# Patient Record
Sex: Female | Born: 1955 | Race: White | Hispanic: No | Marital: Single | Smoking: Former smoker
Health system: Southern US, Community
[De-identification: ages and names within clinical notes are randomized; demographics above are authoritative.]

## PROBLEM LIST (undated history)

## (undated) DIAGNOSIS — T7840XA Allergy, unspecified, initial encounter: Secondary | ICD-10-CM

## (undated) DIAGNOSIS — C3490 Malignant neoplasm of unspecified part of unspecified bronchus or lung: Secondary | ICD-10-CM

## (undated) DIAGNOSIS — F1910 Other psychoactive substance abuse, uncomplicated: Secondary | ICD-10-CM

## (undated) DIAGNOSIS — F329 Major depressive disorder, single episode, unspecified: Secondary | ICD-10-CM

## (undated) DIAGNOSIS — F32A Depression, unspecified: Secondary | ICD-10-CM

## (undated) DIAGNOSIS — F319 Bipolar disorder, unspecified: Secondary | ICD-10-CM

## (undated) DIAGNOSIS — K219 Gastro-esophageal reflux disease without esophagitis: Secondary | ICD-10-CM

## (undated) HISTORY — DX: Depression, unspecified: F32.A

## (undated) HISTORY — PX: ANKLE FRACTURE SURGERY: SHX122

## (undated) HISTORY — DX: Allergy, unspecified, initial encounter: T78.40XA

## (undated) HISTORY — DX: Major depressive disorder, single episode, unspecified: F32.9

---

## 1982-05-15 HISTORY — PX: TUBAL LIGATION: SHX77

## 2000-11-03 ENCOUNTER — Emergency Department (HOSPITAL_COMMUNITY): Admission: EM | Admit: 2000-11-03 | Discharge: 2000-11-03 | Payer: Self-pay | Admitting: *Deleted

## 2000-11-08 ENCOUNTER — Emergency Department (HOSPITAL_COMMUNITY): Admission: EM | Admit: 2000-11-08 | Discharge: 2000-11-09 | Payer: Self-pay | Admitting: Emergency Medicine

## 2002-09-10 ENCOUNTER — Emergency Department (HOSPITAL_COMMUNITY): Admission: EM | Admit: 2002-09-10 | Discharge: 2002-09-10 | Payer: Self-pay | Admitting: Emergency Medicine

## 2002-09-10 ENCOUNTER — Encounter: Payer: Self-pay | Admitting: Emergency Medicine

## 2002-09-16 ENCOUNTER — Ambulatory Visit (HOSPITAL_BASED_OUTPATIENT_CLINIC_OR_DEPARTMENT_OTHER): Admission: RE | Admit: 2002-09-16 | Discharge: 2002-09-16 | Payer: Self-pay | Admitting: Orthopaedic Surgery

## 2002-10-11 ENCOUNTER — Inpatient Hospital Stay (HOSPITAL_COMMUNITY): Admission: EM | Admit: 2002-10-11 | Discharge: 2002-10-20 | Payer: Self-pay | Admitting: Psychiatry

## 2002-10-21 ENCOUNTER — Emergency Department (HOSPITAL_COMMUNITY): Admission: EM | Admit: 2002-10-21 | Discharge: 2002-10-21 | Payer: Self-pay | Admitting: Emergency Medicine

## 2002-10-21 ENCOUNTER — Inpatient Hospital Stay (HOSPITAL_COMMUNITY): Admission: EM | Admit: 2002-10-21 | Discharge: 2002-10-30 | Payer: Self-pay | Admitting: Psychiatry

## 2002-11-04 ENCOUNTER — Other Ambulatory Visit (HOSPITAL_COMMUNITY): Admission: RE | Admit: 2002-11-04 | Discharge: 2002-11-05 | Payer: Self-pay | Admitting: Psychiatry

## 2002-11-05 ENCOUNTER — Inpatient Hospital Stay (HOSPITAL_COMMUNITY): Admission: AD | Admit: 2002-11-05 | Discharge: 2002-11-07 | Payer: Self-pay | Admitting: Psychiatry

## 2003-11-16 ENCOUNTER — Emergency Department (HOSPITAL_COMMUNITY): Admission: EM | Admit: 2003-11-16 | Discharge: 2003-11-16 | Payer: Self-pay | Admitting: Emergency Medicine

## 2004-02-13 ENCOUNTER — Emergency Department (HOSPITAL_COMMUNITY): Admission: EM | Admit: 2004-02-13 | Discharge: 2004-02-13 | Payer: Self-pay | Admitting: Emergency Medicine

## 2004-07-06 ENCOUNTER — Emergency Department (HOSPITAL_COMMUNITY): Admission: EM | Admit: 2004-07-06 | Discharge: 2004-07-06 | Payer: Self-pay | Admitting: *Deleted

## 2004-09-17 ENCOUNTER — Emergency Department (HOSPITAL_COMMUNITY): Admission: EM | Admit: 2004-09-17 | Discharge: 2004-09-17 | Payer: Self-pay | Admitting: Emergency Medicine

## 2006-02-28 ENCOUNTER — Emergency Department (HOSPITAL_COMMUNITY): Admission: EM | Admit: 2006-02-28 | Discharge: 2006-02-28 | Payer: Self-pay | Admitting: Emergency Medicine

## 2006-07-28 ENCOUNTER — Emergency Department (HOSPITAL_COMMUNITY): Admission: EM | Admit: 2006-07-28 | Discharge: 2006-07-28 | Payer: Self-pay | Admitting: Emergency Medicine

## 2007-04-13 ENCOUNTER — Emergency Department (HOSPITAL_COMMUNITY): Admission: EM | Admit: 2007-04-13 | Discharge: 2007-04-13 | Payer: Self-pay | Admitting: Emergency Medicine

## 2007-11-27 ENCOUNTER — Emergency Department (HOSPITAL_COMMUNITY): Admission: EM | Admit: 2007-11-27 | Discharge: 2007-11-28 | Payer: Self-pay | Admitting: Emergency Medicine

## 2008-09-05 ENCOUNTER — Emergency Department (HOSPITAL_COMMUNITY): Admission: EM | Admit: 2008-09-05 | Discharge: 2008-09-05 | Payer: Self-pay | Admitting: Emergency Medicine

## 2009-05-15 HISTORY — PX: LUNG LOBECTOMY: SHX167

## 2009-08-26 ENCOUNTER — Emergency Department (HOSPITAL_COMMUNITY): Admission: EM | Admit: 2009-08-26 | Discharge: 2009-08-26 | Payer: Self-pay | Admitting: Family Medicine

## 2009-10-20 ENCOUNTER — Emergency Department (HOSPITAL_COMMUNITY): Admission: EM | Admit: 2009-10-20 | Discharge: 2009-10-20 | Payer: Self-pay | Admitting: Family Medicine

## 2009-11-13 ENCOUNTER — Emergency Department (HOSPITAL_COMMUNITY): Admission: EM | Admit: 2009-11-13 | Discharge: 2009-11-14 | Payer: Self-pay | Admitting: Emergency Medicine

## 2010-02-09 ENCOUNTER — Ambulatory Visit (HOSPITAL_COMMUNITY): Admission: RE | Admit: 2010-02-09 | Discharge: 2010-02-09 | Payer: Self-pay | Admitting: Rheumatology

## 2010-02-11 ENCOUNTER — Ambulatory Visit: Payer: Self-pay | Admitting: Internal Medicine

## 2010-02-15 LAB — CBC WITH DIFFERENTIAL/PLATELET
EOS%: 0.4 % (ref 0.0–7.0)
Eosinophils Absolute: 0 10*3/uL (ref 0.0–0.5)
HCT: 37 % (ref 34.8–46.6)
LYMPH%: 26.4 % (ref 14.0–49.7)
MCV: 86.1 fL (ref 79.5–101.0)
MONO#: 0.6 10*3/uL (ref 0.1–0.9)
MONO%: 6.8 % (ref 0.0–14.0)
WBC: 8.6 10*3/uL (ref 3.9–10.3)

## 2010-02-15 LAB — COMPREHENSIVE METABOLIC PANEL
AST: 9 U/L (ref 0–37)
CO2: 26 mEq/L (ref 19–32)
Creatinine, Ser: 0.58 mg/dL (ref 0.40–1.20)
Total Bilirubin: 0.2 mg/dL — ABNORMAL LOW (ref 0.3–1.2)
Total Protein: 5.8 g/dL — ABNORMAL LOW (ref 6.0–8.3)

## 2010-02-18 ENCOUNTER — Ambulatory Visit (HOSPITAL_COMMUNITY): Admission: RE | Admit: 2010-02-18 | Discharge: 2010-02-18 | Payer: Self-pay | Admitting: Internal Medicine

## 2010-02-19 ENCOUNTER — Ambulatory Visit (HOSPITAL_COMMUNITY): Admission: RE | Admit: 2010-02-19 | Discharge: 2010-02-19 | Payer: Self-pay | Admitting: Internal Medicine

## 2010-02-24 ENCOUNTER — Ambulatory Visit (HOSPITAL_COMMUNITY): Admission: RE | Admit: 2010-02-24 | Discharge: 2010-02-24 | Payer: Self-pay | Admitting: Internal Medicine

## 2010-03-01 LAB — CBC WITH DIFFERENTIAL/PLATELET
EOS%: 0.4 % (ref 0.0–7.0)
Eosinophils Absolute: 0 10*3/uL (ref 0.0–0.5)
HCT: 39.2 % (ref 34.8–46.6)
HGB: 12.8 g/dL (ref 11.6–15.9)
MCV: 86.3 fL (ref 79.5–101.0)
MONO#: 0.4 10*3/uL (ref 0.1–0.9)
NEUT#: 7.2 10*3/uL — ABNORMAL HIGH (ref 1.5–6.5)
NEUT%: 83.8 % — ABNORMAL HIGH (ref 38.4–76.8)
RBC: 4.54 10*6/uL (ref 3.70–5.45)
RDW: 14.5 % (ref 11.2–14.5)

## 2010-03-01 LAB — COMPREHENSIVE METABOLIC PANEL
BUN: 11 mg/dL (ref 6–23)
Calcium: 9.2 mg/dL (ref 8.4–10.5)
Chloride: 103 mEq/L (ref 96–112)
Creatinine, Ser: 0.66 mg/dL (ref 0.40–1.20)
Potassium: 4.2 mEq/L (ref 3.5–5.3)
Total Protein: 6.1 g/dL (ref 6.0–8.3)

## 2010-03-03 ENCOUNTER — Ambulatory Visit: Payer: Self-pay | Admitting: Thoracic Surgery

## 2010-03-15 ENCOUNTER — Ambulatory Visit: Payer: Self-pay | Admitting: Thoracic Surgery

## 2010-03-15 ENCOUNTER — Ambulatory Visit (HOSPITAL_COMMUNITY): Admission: RE | Admit: 2010-03-15 | Discharge: 2010-03-15 | Payer: Self-pay | Admitting: Thoracic Surgery

## 2010-03-15 DEATH — deceased

## 2010-03-18 ENCOUNTER — Encounter: Payer: Self-pay | Admitting: Thoracic Surgery

## 2010-03-18 ENCOUNTER — Inpatient Hospital Stay (HOSPITAL_COMMUNITY): Admission: RE | Admit: 2010-03-18 | Discharge: 2010-03-28 | Payer: Self-pay | Admitting: Thoracic Surgery

## 2010-03-18 ENCOUNTER — Ambulatory Visit: Payer: Self-pay | Admitting: Critical Care Medicine

## 2010-03-19 ENCOUNTER — Encounter: Payer: Self-pay | Admitting: Thoracic Surgery

## 2010-04-06 ENCOUNTER — Ambulatory Visit: Payer: Self-pay | Admitting: Thoracic Surgery

## 2010-04-06 ENCOUNTER — Encounter: Admission: RE | Admit: 2010-04-06 | Discharge: 2010-04-06 | Payer: Self-pay | Admitting: Thoracic Surgery

## 2010-04-12 ENCOUNTER — Ambulatory Visit: Payer: Self-pay | Admitting: Internal Medicine

## 2010-04-27 ENCOUNTER — Ambulatory Visit: Payer: Self-pay | Admitting: Thoracic Surgery

## 2010-05-05 LAB — COMPREHENSIVE METABOLIC PANEL
ALT: 16 U/L (ref 0–35)
Albumin: 3.3 g/dL — ABNORMAL LOW (ref 3.5–5.2)
BUN: 13 mg/dL (ref 6–23)
CO2: 26 mEq/L (ref 19–32)
Chloride: 106 mEq/L (ref 96–112)
Creatinine, Ser: 0.64 mg/dL (ref 0.40–1.20)
Potassium: 4.1 mEq/L (ref 3.5–5.3)
Total Bilirubin: 0.3 mg/dL (ref 0.3–1.2)

## 2010-05-05 LAB — CBC WITH DIFFERENTIAL/PLATELET
Basophils Absolute: 0 10*3/uL (ref 0.0–0.1)
EOS%: 2.8 % (ref 0.0–7.0)
HCT: 42.8 % (ref 34.8–46.6)
HGB: 13.9 g/dL (ref 11.6–15.9)
MCH: 28.1 pg (ref 25.1–34.0)
MCV: 86.5 fL (ref 79.5–101.0)
MONO%: 7.6 % (ref 0.0–14.0)
NEUT#: 3.7 10*3/uL (ref 1.5–6.5)
RDW: 15 % — ABNORMAL HIGH (ref 11.2–14.5)
WBC: 6 10*3/uL (ref 3.9–10.3)
lymph#: 1.7 10*3/uL (ref 0.9–3.3)

## 2010-05-08 ENCOUNTER — Emergency Department (HOSPITAL_COMMUNITY)
Admission: EM | Admit: 2010-05-08 | Discharge: 2010-05-08 | Payer: Self-pay | Source: Home / Self Care | Admitting: Emergency Medicine

## 2010-05-12 ENCOUNTER — Ambulatory Visit (HOSPITAL_BASED_OUTPATIENT_CLINIC_OR_DEPARTMENT_OTHER): Payer: Self-pay | Admitting: Internal Medicine

## 2010-05-12 ENCOUNTER — Encounter
Admission: RE | Admit: 2010-05-12 | Discharge: 2010-05-12 | Payer: Self-pay | Source: Home / Self Care | Attending: Thoracic Surgery | Admitting: Thoracic Surgery

## 2010-05-12 LAB — CBC WITH DIFFERENTIAL/PLATELET
BASO%: 0.8 % (ref 0.0–2.0)
Basophils Absolute: 0 10*3/uL (ref 0.0–0.1)
EOS%: 2.3 % (ref 0.0–7.0)
Eosinophils Absolute: 0.1 10*3/uL (ref 0.0–0.5)
HCT: 39.7 % (ref 34.8–46.6)
HGB: 13.6 g/dL (ref 11.6–15.9)
LYMPH%: 31 % (ref 14.0–49.7)
MCH: 29 pg (ref 25.1–34.0)
MCHC: 34.3 g/dL (ref 31.5–36.0)
MONO#: 0.2 10*3/uL (ref 0.1–0.9)
NEUT%: 59.8 % (ref 38.4–76.8)
RBC: 4.69 10*6/uL (ref 3.70–5.45)
lymph#: 1 10*3/uL (ref 0.9–3.3)

## 2010-05-12 LAB — COMPREHENSIVE METABOLIC PANEL
ALT: 33 U/L (ref 0–35)
AST: 24 U/L (ref 0–37)
Alkaline Phosphatase: 158 U/L — ABNORMAL HIGH (ref 39–117)
Calcium: 9.3 mg/dL (ref 8.4–10.5)
Creatinine, Ser: 0.56 mg/dL (ref 0.40–1.20)
Total Bilirubin: 0.3 mg/dL (ref 0.3–1.2)
Total Protein: 6.4 g/dL (ref 6.0–8.3)

## 2010-05-20 LAB — COMPREHENSIVE METABOLIC PANEL
ALT: 33 U/L (ref 0–35)
AST: 28 U/L (ref 0–37)
Albumin: 4 g/dL (ref 3.5–5.2)
Alkaline Phosphatase: 159 U/L — ABNORMAL HIGH (ref 39–117)
BUN: 10 mg/dL (ref 6–23)
CO2: 26 mEq/L (ref 19–32)
Calcium: 8.9 mg/dL (ref 8.4–10.5)
Chloride: 106 mEq/L (ref 96–112)
Creatinine, Ser: 0.64 mg/dL (ref 0.40–1.20)
Glucose, Bld: 106 mg/dL — ABNORMAL HIGH (ref 70–99)
Potassium: 3.5 mEq/L (ref 3.5–5.3)
Sodium: 142 mEq/L (ref 135–145)
Total Bilirubin: 0.2 mg/dL — ABNORMAL LOW (ref 0.3–1.2)
Total Protein: 6.5 g/dL (ref 6.0–8.3)

## 2010-05-20 LAB — CBC WITH DIFFERENTIAL/PLATELET
BASO%: 0 % (ref 0.0–2.0)
Basophils Absolute: 0 10*3/uL (ref 0.0–0.1)
EOS%: 0.9 % (ref 0.0–7.0)
Eosinophils Absolute: 0 10*3/uL (ref 0.0–0.5)
HCT: 37.8 % (ref 34.8–46.6)
HGB: 12.7 g/dL (ref 11.6–15.9)
LYMPH%: 52.5 % — ABNORMAL HIGH (ref 14.0–49.7)
MCH: 28.5 pg (ref 25.1–34.0)
MCHC: 33.6 g/dL (ref 31.5–36.0)
MCV: 84.9 fL (ref 79.5–101.0)
MONO#: 0.5 10*3/uL (ref 0.1–0.9)
MONO%: 14.2 % — ABNORMAL HIGH (ref 0.0–14.0)
NEUT#: 1 10*3/uL — ABNORMAL LOW (ref 1.5–6.5)
NEUT%: 32.4 % — ABNORMAL LOW (ref 38.4–76.8)
Platelets: 86 10*3/uL — ABNORMAL LOW (ref 145–400)
RBC: 4.45 10*6/uL (ref 3.70–5.45)
RDW: 14.1 % (ref 11.2–14.5)
WBC: 3.2 10*3/uL — ABNORMAL LOW (ref 3.9–10.3)
lymph#: 1.7 10*3/uL (ref 0.9–3.3)
nRBC: 0 % (ref 0–0)

## 2010-05-26 LAB — CBC WITH DIFFERENTIAL/PLATELET
BASO%: 0.4 % (ref 0.0–2.0)
Basophils Absolute: 0 10*3/uL (ref 0.0–0.1)
EOS%: 0.2 % (ref 0.0–7.0)
Eosinophils Absolute: 0 10*3/uL (ref 0.0–0.5)
HCT: 38.9 % (ref 34.8–46.6)
HGB: 13 g/dL (ref 11.6–15.9)
LYMPH%: 19.7 % (ref 14.0–49.7)
MCH: 29.1 pg (ref 25.1–34.0)
MCHC: 33.4 g/dL (ref 31.5–36.0)
MCV: 87 fL (ref 79.5–101.0)
MONO#: 0.6 10*3/uL (ref 0.1–0.9)
MONO%: 10.1 % (ref 0.0–14.0)
NEUT#: 3.9 10*3/uL (ref 1.5–6.5)
NEUT%: 69.6 % (ref 38.4–76.8)
Platelets: 228 10*3/uL (ref 145–400)
RBC: 4.47 10*6/uL (ref 3.70–5.45)
RDW: 16.4 % — ABNORMAL HIGH (ref 11.2–14.5)
WBC: 5.5 10*3/uL (ref 3.9–10.3)
lymph#: 1.1 10*3/uL (ref 0.9–3.3)
nRBC: 0 % (ref 0–0)

## 2010-05-26 LAB — COMPREHENSIVE METABOLIC PANEL
ALT: 20 U/L (ref 0–35)
AST: 18 U/L (ref 0–37)
Albumin: 3.3 g/dL — ABNORMAL LOW (ref 3.5–5.2)
Alkaline Phosphatase: 138 U/L — ABNORMAL HIGH (ref 39–117)
BUN: 12 mg/dL (ref 6–23)
CO2: 25 mEq/L (ref 19–32)
Calcium: 9.3 mg/dL (ref 8.4–10.5)
Chloride: 106 mEq/L (ref 96–112)
Creatinine, Ser: 0.56 mg/dL (ref 0.40–1.20)
Glucose, Bld: 101 mg/dL — ABNORMAL HIGH (ref 70–99)
Potassium: 4 mEq/L (ref 3.5–5.3)
Sodium: 141 mEq/L (ref 135–145)
Total Bilirubin: 0.2 mg/dL — ABNORMAL LOW (ref 0.3–1.2)
Total Protein: 6.2 g/dL (ref 6.0–8.3)

## 2010-06-05 ENCOUNTER — Encounter: Payer: Self-pay | Admitting: Rheumatology

## 2010-06-05 ENCOUNTER — Encounter: Payer: Self-pay | Admitting: Thoracic Surgery

## 2010-06-09 LAB — COMPREHENSIVE METABOLIC PANEL
ALT: 54 U/L — ABNORMAL HIGH (ref 0–35)
AST: 38 U/L — ABNORMAL HIGH (ref 0–37)
BUN: 7 mg/dL (ref 6–23)
CO2: 23 mEq/L (ref 19–32)
Calcium: 8.6 mg/dL (ref 8.4–10.5)
Total Protein: 5.9 g/dL — ABNORMAL LOW (ref 6.0–8.3)

## 2010-06-09 LAB — CBC WITH DIFFERENTIAL/PLATELET
BASO%: 0 % (ref 0.0–2.0)
EOS%: 3.7 % (ref 0.0–7.0)
Eosinophils Absolute: 0.1 10*3/uL (ref 0.0–0.5)
MCH: 28.8 pg (ref 25.1–34.0)
MCV: 84.5 fL (ref 79.5–101.0)
MONO#: 0.2 10*3/uL (ref 0.1–0.9)
NEUT#: 0.8 10*3/uL — ABNORMAL LOW (ref 1.5–6.5)
RDW: 15 % — ABNORMAL HIGH (ref 11.2–14.5)
WBC: 2.2 10*3/uL — ABNORMAL LOW (ref 3.9–10.3)

## 2010-06-15 ENCOUNTER — Ambulatory Visit (HOSPITAL_BASED_OUTPATIENT_CLINIC_OR_DEPARTMENT_OTHER): Payer: Self-pay | Admitting: Internal Medicine

## 2010-06-15 DIAGNOSIS — Z5111 Encounter for antineoplastic chemotherapy: Secondary | ICD-10-CM

## 2010-06-15 DIAGNOSIS — C3410 Malignant neoplasm of upper lobe, unspecified bronchus or lung: Secondary | ICD-10-CM

## 2010-06-15 LAB — CBC WITH DIFFERENTIAL/PLATELET
BASO%: 0.3 % (ref 0.0–2.0)
Basophils Absolute: 0 10*3/uL (ref 0.0–0.1)
EOS%: 2.7 % (ref 0.0–7.0)
LYMPH%: 46.9 % (ref 14.0–49.7)
MONO#: 0.5 10*3/uL (ref 0.1–0.9)
MONO%: 17.5 % — ABNORMAL HIGH (ref 0.0–14.0)
NEUT#: 1 10*3/uL — ABNORMAL LOW (ref 1.5–6.5)
NEUT%: 32.6 % — ABNORMAL LOW (ref 38.4–76.8)
Platelets: 129 10*3/uL — ABNORMAL LOW (ref 145–400)
RBC: 3.82 10*6/uL (ref 3.70–5.45)
WBC: 2.9 10*3/uL — ABNORMAL LOW (ref 3.9–10.3)
lymph#: 1.4 10*3/uL (ref 0.9–3.3)

## 2010-06-22 ENCOUNTER — Other Ambulatory Visit: Payer: Self-pay | Admitting: Internal Medicine

## 2010-06-22 ENCOUNTER — Encounter (HOSPITAL_BASED_OUTPATIENT_CLINIC_OR_DEPARTMENT_OTHER): Payer: Self-pay | Admitting: Internal Medicine

## 2010-06-22 DIAGNOSIS — C3410 Malignant neoplasm of upper lobe, unspecified bronchus or lung: Secondary | ICD-10-CM

## 2010-06-22 DIAGNOSIS — Z5111 Encounter for antineoplastic chemotherapy: Secondary | ICD-10-CM

## 2010-06-22 LAB — CBC WITH DIFFERENTIAL/PLATELET
Eosinophils Absolute: 0.1 10*3/uL (ref 0.0–0.5)
HCT: 35.4 % (ref 34.8–46.6)
MCHC: 32.2 g/dL (ref 31.5–36.0)
NEUT%: 46.5 % (ref 38.4–76.8)
RBC: 3.86 10*6/uL (ref 3.70–5.45)
RDW: 19 % — ABNORMAL HIGH (ref 11.2–14.5)
WBC: 4.4 10*3/uL (ref 3.9–10.3)
lymph#: 1.6 10*3/uL (ref 0.9–3.3)

## 2010-06-22 LAB — COMPREHENSIVE METABOLIC PANEL
Alkaline Phosphatase: 131 U/L — ABNORMAL HIGH (ref 39–117)
BUN: 12 mg/dL (ref 6–23)
CO2: 24 mEq/L (ref 19–32)
Glucose, Bld: 118 mg/dL — ABNORMAL HIGH (ref 70–99)
Potassium: 3.9 mEq/L (ref 3.5–5.3)
Sodium: 141 mEq/L (ref 135–145)
Total Bilirubin: 0.1 mg/dL — ABNORMAL LOW (ref 0.3–1.2)

## 2010-07-11 ENCOUNTER — Other Ambulatory Visit: Payer: Self-pay | Admitting: Thoracic Surgery

## 2010-07-11 DIAGNOSIS — C3410 Malignant neoplasm of upper lobe, unspecified bronchus or lung: Secondary | ICD-10-CM

## 2010-07-12 ENCOUNTER — Ambulatory Visit: Payer: Self-pay | Admitting: Thoracic Surgery

## 2010-07-13 ENCOUNTER — Encounter (HOSPITAL_BASED_OUTPATIENT_CLINIC_OR_DEPARTMENT_OTHER): Payer: Self-pay | Admitting: Internal Medicine

## 2010-07-13 ENCOUNTER — Other Ambulatory Visit: Payer: Self-pay | Admitting: Internal Medicine

## 2010-07-13 DIAGNOSIS — C3410 Malignant neoplasm of upper lobe, unspecified bronchus or lung: Secondary | ICD-10-CM

## 2010-07-13 DIAGNOSIS — Z5111 Encounter for antineoplastic chemotherapy: Secondary | ICD-10-CM

## 2010-07-13 DIAGNOSIS — C3490 Malignant neoplasm of unspecified part of unspecified bronchus or lung: Secondary | ICD-10-CM

## 2010-07-13 LAB — CBC WITH DIFFERENTIAL/PLATELET
BASO%: 3.4 % — ABNORMAL HIGH (ref 0.0–2.0)
EOS%: 2.5 % (ref 0.0–7.0)
MCH: 31.7 pg (ref 25.1–34.0)
MCV: 92.4 fL (ref 79.5–101.0)
MONO%: 10.1 % (ref 0.0–14.0)
RBC: 3.09 10*6/uL — ABNORMAL LOW (ref 3.70–5.45)
RDW: 22.4 % — ABNORMAL HIGH (ref 11.2–14.5)

## 2010-07-13 LAB — COMPREHENSIVE METABOLIC PANEL
ALT: 16 U/L (ref 0–35)
Albumin: 3.3 g/dL — ABNORMAL LOW (ref 3.5–5.2)
Chloride: 107 mEq/L (ref 96–112)
Potassium: 4.1 mEq/L (ref 3.5–5.3)
Total Bilirubin: 0.2 mg/dL — ABNORMAL LOW (ref 0.3–1.2)
Total Protein: 5.6 g/dL — ABNORMAL LOW (ref 6.0–8.3)

## 2010-07-25 LAB — BASIC METABOLIC PANEL
Calcium: 9.4 mg/dL (ref 8.4–10.5)
Glucose, Bld: 134 mg/dL — ABNORMAL HIGH (ref 70–99)
Sodium: 137 mEq/L (ref 135–145)

## 2010-07-25 LAB — CBC
Hemoglobin: 13.7 g/dL (ref 12.0–15.0)
MCH: 28 pg (ref 26.0–34.0)
MCHC: 32.4 g/dL (ref 30.0–36.0)
RBC: 4.9 MIL/uL (ref 3.87–5.11)

## 2010-07-25 LAB — DIFFERENTIAL
Basophils Absolute: 0 10*3/uL (ref 0.0–0.1)
Basophils Relative: 0 % (ref 0–1)
Eosinophils Absolute: 0.1 10*3/uL (ref 0.0–0.7)
Eosinophils Relative: 1 % (ref 0–5)
Lymphocytes Relative: 18 % (ref 12–46)
Monocytes Absolute: 0.1 10*3/uL (ref 0.1–1.0)
Monocytes Relative: 2 % — ABNORMAL LOW (ref 3–12)
Neutro Abs: 5 10*3/uL (ref 1.7–7.7)
Neutrophils Relative %: 79 % — ABNORMAL HIGH (ref 43–77)

## 2010-07-26 LAB — GLUCOSE, CAPILLARY
Glucose-Capillary: 101 mg/dL — ABNORMAL HIGH (ref 70–99)
Glucose-Capillary: 104 mg/dL — ABNORMAL HIGH (ref 70–99)
Glucose-Capillary: 114 mg/dL — ABNORMAL HIGH (ref 70–99)
Glucose-Capillary: 114 mg/dL — ABNORMAL HIGH (ref 70–99)
Glucose-Capillary: 115 mg/dL — ABNORMAL HIGH (ref 70–99)
Glucose-Capillary: 115 mg/dL — ABNORMAL HIGH (ref 70–99)
Glucose-Capillary: 120 mg/dL — ABNORMAL HIGH (ref 70–99)
Glucose-Capillary: 121 mg/dL — ABNORMAL HIGH (ref 70–99)
Glucose-Capillary: 122 mg/dL — ABNORMAL HIGH (ref 70–99)
Glucose-Capillary: 125 mg/dL — ABNORMAL HIGH (ref 70–99)
Glucose-Capillary: 127 mg/dL — ABNORMAL HIGH (ref 70–99)
Glucose-Capillary: 128 mg/dL — ABNORMAL HIGH (ref 70–99)
Glucose-Capillary: 128 mg/dL — ABNORMAL HIGH (ref 70–99)
Glucose-Capillary: 131 mg/dL — ABNORMAL HIGH (ref 70–99)
Glucose-Capillary: 134 mg/dL — ABNORMAL HIGH (ref 70–99)
Glucose-Capillary: 134 mg/dL — ABNORMAL HIGH (ref 70–99)
Glucose-Capillary: 134 mg/dL — ABNORMAL HIGH (ref 70–99)
Glucose-Capillary: 135 mg/dL — ABNORMAL HIGH (ref 70–99)
Glucose-Capillary: 136 mg/dL — ABNORMAL HIGH (ref 70–99)
Glucose-Capillary: 141 mg/dL — ABNORMAL HIGH (ref 70–99)
Glucose-Capillary: 143 mg/dL — ABNORMAL HIGH (ref 70–99)
Glucose-Capillary: 144 mg/dL — ABNORMAL HIGH (ref 70–99)
Glucose-Capillary: 154 mg/dL — ABNORMAL HIGH (ref 70–99)
Glucose-Capillary: 170 mg/dL — ABNORMAL HIGH (ref 70–99)
Glucose-Capillary: 185 mg/dL — ABNORMAL HIGH (ref 70–99)
Glucose-Capillary: 185 mg/dL — ABNORMAL HIGH (ref 70–99)
Glucose-Capillary: 199 mg/dL — ABNORMAL HIGH (ref 70–99)
Glucose-Capillary: 243 mg/dL — ABNORMAL HIGH (ref 70–99)
Glucose-Capillary: 99 mg/dL (ref 70–99)

## 2010-07-26 LAB — CBC
HCT: 31.3 % — ABNORMAL LOW (ref 36.0–46.0)
HCT: 32.6 % — ABNORMAL LOW (ref 36.0–46.0)
HCT: 35.2 % — ABNORMAL LOW (ref 36.0–46.0)
HCT: 37.2 % (ref 36.0–46.0)
Hemoglobin: 10 g/dL — ABNORMAL LOW (ref 12.0–15.0)
Hemoglobin: 10.2 g/dL — ABNORMAL LOW (ref 12.0–15.0)
Hemoglobin: 10.8 g/dL — ABNORMAL LOW (ref 12.0–15.0)
Hemoglobin: 9.6 g/dL — ABNORMAL LOW (ref 12.0–15.0)
MCH: 27.7 pg (ref 26.0–34.0)
MCH: 27.8 pg (ref 26.0–34.0)
MCH: 28 pg (ref 26.0–34.0)
MCH: 28 pg (ref 26.0–34.0)
MCH: 28.4 pg (ref 26.0–34.0)
MCHC: 30.7 g/dL (ref 30.0–36.0)
MCHC: 31.3 g/dL (ref 30.0–36.0)
MCHC: 32.8 g/dL (ref 30.0–36.0)
MCV: 87.8 fL (ref 78.0–100.0)
MCV: 88.4 fL (ref 78.0–100.0)
MCV: 90 fL (ref 78.0–100.0)
MCV: 90.5 fL (ref 78.0–100.0)
Platelets: 305 10*3/uL (ref 150–400)
Platelets: 378 10*3/uL (ref 150–400)
RBC: 3.46 MIL/uL — ABNORMAL LOW (ref 3.87–5.11)
RBC: 3.6 MIL/uL — ABNORMAL LOW (ref 3.87–5.11)
RBC: 3.64 MIL/uL — ABNORMAL LOW (ref 3.87–5.11)
RDW: 14.6 % (ref 11.5–15.5)
WBC: 20.4 10*3/uL — ABNORMAL HIGH (ref 4.0–10.5)
WBC: 8.3 10*3/uL (ref 4.0–10.5)

## 2010-07-26 LAB — BASIC METABOLIC PANEL
BUN: 15 mg/dL (ref 6–23)
BUN: 22 mg/dL (ref 6–23)
BUN: 7 mg/dL (ref 6–23)
CO2: 29 mEq/L (ref 19–32)
CO2: 30 mEq/L (ref 19–32)
CO2: 31 mEq/L (ref 19–32)
CO2: 31 mEq/L (ref 19–32)
CO2: 32 mEq/L (ref 19–32)
CO2: 33 mEq/L — ABNORMAL HIGH (ref 19–32)
CO2: 36 mEq/L — ABNORMAL HIGH (ref 19–32)
Calcium: 8.8 mg/dL (ref 8.4–10.5)
Calcium: 9.1 mg/dL (ref 8.4–10.5)
Calcium: 9.2 mg/dL (ref 8.4–10.5)
Calcium: 9.4 mg/dL (ref 8.4–10.5)
Chloride: 100 mEq/L (ref 96–112)
Chloride: 103 mEq/L (ref 96–112)
Chloride: 103 mEq/L (ref 96–112)
Chloride: 93 mEq/L — ABNORMAL LOW (ref 96–112)
Creatinine, Ser: 0.53 mg/dL (ref 0.4–1.2)
Creatinine, Ser: 0.64 mg/dL (ref 0.4–1.2)
Creatinine, Ser: 0.66 mg/dL (ref 0.4–1.2)
GFR calc Af Amer: >60 mL/min (ref >60)
GFR calc Af Amer: >60 mL/min (ref >60)
GFR calc non Af Amer: >60 mL/min (ref >60)
Glucose, Bld: 114 mg/dL — ABNORMAL HIGH (ref 70–99)
Glucose, Bld: 118 mg/dL — ABNORMAL HIGH (ref 70–99)
Glucose, Bld: 137 mg/dL — ABNORMAL HIGH (ref 70–99)
Glucose, Bld: 145 mg/dL — ABNORMAL HIGH (ref 70–99)
Glucose, Bld: 206 mg/dL — ABNORMAL HIGH (ref 70–99)
Glucose, Bld: 96 mg/dL (ref 70–99)
Potassium: 3.4 mEq/L — ABNORMAL LOW (ref 3.5–5.1)
Potassium: 3.6 mEq/L (ref 3.5–5.1)
Potassium: 3.6 mEq/L (ref 3.5–5.1)
Potassium: 3.7 mEq/L (ref 3.5–5.1)
Potassium: 3.9 mEq/L (ref 3.5–5.1)
Sodium: 132 mEq/L — ABNORMAL LOW (ref 135–145)
Sodium: 140 mEq/L (ref 135–145)
Sodium: 146 mEq/L — ABNORMAL HIGH (ref 135–145)

## 2010-07-26 LAB — POCT I-STAT 3, ART BLOOD GAS (G3+)
Acid-Base Excess: 6 mmol/L — ABNORMAL HIGH (ref 0.0–2.0)
Acid-Base Excess: 6 mmol/L — ABNORMAL HIGH (ref 0.0–2.0)
Acid-Base Excess: 7 mmol/L — ABNORMAL HIGH (ref 0.0–2.0)
Bicarbonate: 31.2 mEq/L — ABNORMAL HIGH (ref 20.0–24.0)
Bicarbonate: 31.7 mEq/L — ABNORMAL HIGH (ref 20.0–24.0)
Bicarbonate: 34.1 mEq/L — ABNORMAL HIGH (ref 20.0–24.0)
O2 Saturation: 93 %
O2 Saturation: 94 %
Patient temperature: 97.3
Patient temperature: 98.4
Patient temperature: 98.6
TCO2: 31 mmol/L (ref 0–100)
TCO2: 33 mmol/L (ref 0–100)
TCO2: 33 mmol/L (ref 0–100)
TCO2: 34 mmol/L (ref 0–100)
TCO2: 36 mmol/L (ref 0–100)
pCO2 arterial: 49.4 mmHg — ABNORMAL HIGH (ref 35.0–45.0)
pCO2 arterial: 50 mmHg — ABNORMAL HIGH (ref 35.0–45.0)
pCO2 arterial: 51.7 mmHg — ABNORMAL HIGH (ref 35.0–45.0)
pCO2 arterial: 53.1 mmHg — ABNORMAL HIGH (ref 35.0–45.0)
pH, Arterial: 7.376 (ref 7.350–7.400)
pH, Arterial: 7.412 — ABNORMAL HIGH (ref 7.350–7.400)
pH, Arterial: 7.414 — ABNORMAL HIGH (ref 7.350–7.400)
pH, Arterial: 7.424 — ABNORMAL HIGH (ref 7.350–7.400)
pH, Arterial: 7.426 — ABNORMAL HIGH (ref 7.350–7.400)
pO2, Arterial: 57 mmHg — ABNORMAL LOW (ref 80.0–100.0)
pO2, Arterial: 78 mmHg — ABNORMAL LOW (ref 80.0–100.0)

## 2010-07-26 LAB — COMPREHENSIVE METABOLIC PANEL
AST: 15 U/L (ref 0–37)
Alkaline Phosphatase: 116 U/L (ref 39–117)
BUN: 5 mg/dL — ABNORMAL LOW (ref 6–23)
CO2: 31 mEq/L (ref 19–32)
Chloride: 102 mEq/L (ref 96–112)
Creatinine, Ser: 0.43 mg/dL (ref 0.4–1.2)
GFR calc non Af Amer: >60 mL/min (ref >60)
Potassium: 3.6 mEq/L (ref 3.5–5.1)
Total Bilirubin: 0.5 mg/dL (ref 0.3–1.2)

## 2010-07-26 LAB — CULTURE, RESPIRATORY W GRAM STAIN

## 2010-07-26 LAB — CARDIAC PANEL(CRET KIN+CKTOT+MB+TROPI): Troponin I: 0.03 ng/mL (ref 0.00–0.06)

## 2010-07-27 LAB — CBC
HCT: 37.8 % (ref 36.0–46.0)
Hemoglobin: 12 g/dL (ref 12.0–15.0)
MCH: 27.9 pg (ref 26.0–34.0)
MCHC: 31.7 g/dL (ref 30.0–36.0)
RDW: 14.5 % (ref 11.5–15.5)

## 2010-07-27 LAB — TYPE AND SCREEN
Antibody Screen: NEGATIVE
Unit division: 0

## 2010-07-27 LAB — APTT: aPTT: 30 seconds (ref 24–37)

## 2010-07-27 LAB — COMPREHENSIVE METABOLIC PANEL
ALT: 11 U/L (ref 0–35)
CO2: 25 mEq/L (ref 19–32)
Calcium: 8.7 mg/dL (ref 8.4–10.5)
Creatinine, Ser: 0.58 mg/dL (ref 0.4–1.2)
GFR calc non Af Amer: >60 mL/min (ref >60)
Glucose, Bld: 112 mg/dL — ABNORMAL HIGH (ref 70–99)
Sodium: 136 mEq/L (ref 135–145)
Total Protein: 5.6 g/dL — ABNORMAL LOW (ref 6.0–8.3)

## 2010-07-27 LAB — URINALYSIS, ROUTINE W REFLEX MICROSCOPIC
Glucose, UA: NEGATIVE mg/dL
Ketones, ur: NEGATIVE mg/dL
Nitrite: NEGATIVE
Protein, ur: NEGATIVE mg/dL
Urobilinogen, UA: 0.2 mg/dL (ref 0.0–1.0)

## 2010-07-27 LAB — BLOOD GAS, ARTERIAL
FIO2: 0.21 %
O2 Saturation: 97.5 %
Patient temperature: 98.6
TCO2: 27.7 mmol/L (ref 0–100)
pH, Arterial: 7.423 — ABNORMAL HIGH (ref 7.350–7.400)

## 2010-07-27 LAB — PROTIME-INR
INR: 0.97 (ref 0.00–1.49)
Prothrombin Time: 13.1 seconds (ref 11.6–15.2)

## 2010-07-27 LAB — SURGICAL PCR SCREEN: Staphylococcus aureus: NEGATIVE

## 2010-07-28 LAB — POCT I-STAT, CHEM 8
BUN: 10 mg/dL (ref 6–23)
Hemoglobin: 13.3 g/dL (ref 12.0–15.0)
Potassium: 3.9 mEq/L (ref 3.5–5.1)
Sodium: 136 mEq/L (ref 135–145)
TCO2: 30 mmol/L (ref 0–100)

## 2010-07-28 LAB — CBC
HCT: 37.7 % (ref 36.0–46.0)
Hemoglobin: 12.3 g/dL (ref 12.0–15.0)
MCHC: 32.6 g/dL (ref 30.0–36.0)
MCV: 88.1 fL (ref 78.0–100.0)
RDW: 14 % (ref 11.5–15.5)

## 2010-07-28 LAB — BASIC METABOLIC PANEL
BUN: 10 mg/dL (ref 6–23)
Creatinine, Ser: 0.64 mg/dL (ref 0.4–1.2)
GFR calc Af Amer: >60 mL/min (ref >60)
GFR calc non Af Amer: >60 mL/min (ref >60)
Potassium: 3.9 mEq/L (ref 3.5–5.1)

## 2010-07-28 LAB — PROTIME-INR: INR: 1.04 (ref 0.00–1.49)

## 2010-07-28 LAB — GLUCOSE, CAPILLARY: Glucose-Capillary: 132 mg/dL — ABNORMAL HIGH (ref 70–99)

## 2010-07-31 LAB — POCT I-STAT, CHEM 8
Hemoglobin: 13.6 g/dL (ref 12.0–15.0)
Potassium: 3.4 mEq/L — ABNORMAL LOW (ref 3.5–5.1)
Sodium: 139 mEq/L (ref 135–145)
TCO2: 29 mmol/L (ref 0–100)

## 2010-07-31 LAB — SEDIMENTATION RATE: Sed Rate: 66 mm/hr — ABNORMAL HIGH (ref 0–22)

## 2010-08-01 LAB — POCT I-STAT, CHEM 8
Creatinine, Ser: 0.5 mg/dL (ref 0.4–1.2)
Hemoglobin: 14.6 g/dL (ref 12.0–15.0)
Potassium: 3.9 mEq/L (ref 3.5–5.1)
Sodium: 138 mEq/L (ref 135–145)

## 2010-08-01 LAB — SEDIMENTATION RATE: Sed Rate: 53 mm/hr — ABNORMAL HIGH (ref 0–22)

## 2010-08-03 LAB — POCT I-STAT, CHEM 8
Chloride: 105 mEq/L (ref 96–112)
Glucose, Bld: 134 mg/dL — ABNORMAL HIGH (ref 70–99)
HCT: 47 % — ABNORMAL HIGH (ref 36.0–46.0)
Hemoglobin: 16 g/dL — ABNORMAL HIGH (ref 12.0–15.0)
Potassium: 4.2 mEq/L (ref 3.5–5.1)
Sodium: 138 mEq/L (ref 135–145)

## 2010-08-08 ENCOUNTER — Other Ambulatory Visit (HOSPITAL_COMMUNITY): Payer: Self-pay

## 2010-08-18 ENCOUNTER — Other Ambulatory Visit (HOSPITAL_COMMUNITY): Payer: Self-pay

## 2010-08-24 LAB — DIFFERENTIAL
Basophils Absolute: 0 10*3/uL (ref 0.0–0.1)
Basophils Relative: 1 % (ref 0–1)
Eosinophils Absolute: 0.2 10*3/uL (ref 0.0–0.7)
Monocytes Relative: 8 % (ref 3–12)
Neutro Abs: 4.3 10*3/uL (ref 1.7–7.7)
Neutrophils Relative %: 64 % (ref 43–77)

## 2010-08-24 LAB — COMPREHENSIVE METABOLIC PANEL
ALT: 18 U/L (ref 0–35)
Alkaline Phosphatase: 87 U/L (ref 39–117)
BUN: 13 mg/dL (ref 6–23)
CO2: 29 mEq/L (ref 19–32)
Chloride: 102 mEq/L (ref 96–112)
Glucose, Bld: 98 mg/dL (ref 70–99)
Potassium: 3.5 mEq/L (ref 3.5–5.1)
Sodium: 141 mEq/L (ref 135–145)
Total Bilirubin: 0.3 mg/dL (ref 0.3–1.2)

## 2010-08-24 LAB — CBC
HCT: 42.8 % (ref 36.0–46.0)
Hemoglobin: 14.6 g/dL (ref 12.0–15.0)
RBC: 4.58 MIL/uL (ref 3.87–5.11)
WBC: 6.8 10*3/uL (ref 4.0–10.5)

## 2010-08-26 ENCOUNTER — Other Ambulatory Visit (HOSPITAL_COMMUNITY): Payer: Self-pay

## 2010-08-30 ENCOUNTER — Ambulatory Visit (HOSPITAL_COMMUNITY)
Admission: RE | Admit: 2010-08-30 | Discharge: 2010-08-30 | Disposition: A | Discharge status: Home / Self Care | Payer: Self-pay | Source: Ambulatory Visit | Attending: Internal Medicine | Admitting: Internal Medicine

## 2010-08-30 ENCOUNTER — Other Ambulatory Visit: Payer: Self-pay | Admitting: Internal Medicine

## 2010-08-30 ENCOUNTER — Encounter (HOSPITAL_COMMUNITY): Payer: Self-pay

## 2010-08-30 ENCOUNTER — Encounter (HOSPITAL_BASED_OUTPATIENT_CLINIC_OR_DEPARTMENT_OTHER): Payer: Self-pay | Admitting: Internal Medicine

## 2010-08-30 DIAGNOSIS — C3490 Malignant neoplasm of unspecified part of unspecified bronchus or lung: Secondary | ICD-10-CM | POA: Insufficient documentation

## 2010-08-30 DIAGNOSIS — Z5111 Encounter for antineoplastic chemotherapy: Secondary | ICD-10-CM

## 2010-08-30 DIAGNOSIS — E079 Disorder of thyroid, unspecified: Secondary | ICD-10-CM | POA: Insufficient documentation

## 2010-08-30 DIAGNOSIS — K449 Diaphragmatic hernia without obstruction or gangrene: Secondary | ICD-10-CM | POA: Insufficient documentation

## 2010-08-30 DIAGNOSIS — C3410 Malignant neoplasm of upper lobe, unspecified bronchus or lung: Secondary | ICD-10-CM

## 2010-08-30 HISTORY — DX: Malignant neoplasm of unspecified part of unspecified bronchus or lung: C34.90

## 2010-08-30 LAB — CMP (CANCER CENTER ONLY)
ALT(SGPT): 24 U/L (ref 10–47)
Albumin: 2.5 g/dL — ABNORMAL LOW (ref 3.3–5.5)
Alkaline Phosphatase: 125 U/L — ABNORMAL HIGH (ref 26–84)
Potassium: 3.6 mEq/L (ref 3.3–4.7)
Sodium: 144 mEq/L (ref 128–145)
Total Bilirubin: 0.2 mg/dl (ref 0.20–1.60)
Total Protein: 6.6 g/dL (ref 6.4–8.1)

## 2010-08-30 LAB — CBC WITH DIFFERENTIAL/PLATELET
BASO%: 1.7 % (ref 0.0–2.0)
Eosinophils Absolute: 0.1 10*3/uL (ref 0.0–0.5)
LYMPH%: 31.2 % (ref 14.0–49.7)
MCHC: 33.6 g/dL (ref 31.5–36.0)
MCV: 102 fL — ABNORMAL HIGH (ref 79.5–101.0)
MONO#: 0.4 10*3/uL (ref 0.1–0.9)
MONO%: 10.5 % (ref 0.0–14.0)
NEUT#: 2.2 10*3/uL (ref 1.5–6.5)
Platelets: 177 10*3/uL (ref 145–400)
RBC: 3.43 10*6/uL — ABNORMAL LOW (ref 3.70–5.45)
RDW: 17.2 % — ABNORMAL HIGH (ref 11.2–14.5)
WBC: 3.9 10*3/uL (ref 3.9–10.3)

## 2010-08-30 MED ORDER — IOHEXOL 300 MG/ML  SOLN
80.0000 mL | Freq: Once | INTRAMUSCULAR | Status: AC | PRN
  Administered 2010-08-30: 80 mL via INTRAVENOUS

## 2010-08-31 ENCOUNTER — Encounter (HOSPITAL_BASED_OUTPATIENT_CLINIC_OR_DEPARTMENT_OTHER): Payer: Self-pay | Admitting: Internal Medicine

## 2010-08-31 ENCOUNTER — Other Ambulatory Visit: Payer: Self-pay | Admitting: Internal Medicine

## 2010-08-31 DIAGNOSIS — C3490 Malignant neoplasm of unspecified part of unspecified bronchus or lung: Secondary | ICD-10-CM

## 2010-08-31 DIAGNOSIS — C3410 Malignant neoplasm of upper lobe, unspecified bronchus or lung: Secondary | ICD-10-CM

## 2010-09-27 NOTE — Letter (Signed)
May 12, 2010   Lajuana Matte, MD  [address]  [place], [place] [address]   Re:  [name], [name]                DOB:  15-Mar-1956   Dear Arbutus Ped,   I saw the patient back today.  She was going back to work and having  some pain and went to the emergency room and was on Sunday and they left  have her put her off work for the rest of this week.  Her blood pressure  is 135/89, pulse 100, respirations 16, sats were 96%.  Chest x-ray  showed normal postoperative changes.  Incisions were well-healed.  I  think this is just some post thoracotomy pain and there is no evidence  of any infection.  I released her return to work on the 31st.  We will  see her back again in 2 months with a chest x-ray.   Ines Bloomer, M.D.  Electronically Signed   DPB/MEDQ  D:  05/12/2010  T:  05/13/2010  Job:  [id number]

## 2010-09-27 NOTE — Letter (Signed)
April 06, 2010   Si Gaul, MD  [address]  [place], [place]  [address]   Re:  [name], KNIPPENBERG                  DOB:  [birth year]   Dear Arbutus Ped,   I saw the patient today, after we did a right lobectomy with node  dissection.  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  Her nodes were  all negative.  Nearly postoperatively, she went into respiratory  distress and had to be on the respirator for several days but that has  resolved, and she comes in today doing remarkably well.  Her chest x-ray  showed normal postoperative changes.  Blood pressure is 126/73, pulse  100, respirations 18, sats were 97%.  We removed her chest tube sutures.  She wants to go back to work as a short order.  I will release her next  week to do that at least on a part time basis.  I will see her back  again in 3 weeks with a chest x-ray.  I referred him to Dr. Arbutus Ped for  an oncology opinion.   Ines Bloomer, M.D.  Electronically Signed   DPB/MEDQ  D:  04/06/2010  T:  04/07/2010  Job:  [id number]   cc:   Lajuana Matte, MD

## 2010-09-27 NOTE — Letter (Signed)
March 03, 2010   Lajuana Matte, MD  [phone]  [place], [place] [address]   Re:  Claudia, Gonzalez                DOB:  11/11/1955   Dear Arbutus Ped:   I appreciate the opportunity of seeing the patient.  This 55 year old  patient was found to have a right upper lobe mass and underwent a biopsy  which revealed adenocarcinoma.  She has had no hemoptysis, fever,  chills, or excessive sputum.  A lesion was found by her primary care  doctor with her chest x-ray.  A PET scan was positive and the lesion was  about 5 cm in size.  She still continues to smoke one pack a day, has  been instructed to stop.  The lesion is 5.5 x 3.8 cm.  A PET scan of the  right upper lobe showed that this lesion was positive and there was a  question of a right hilar uptake.  She is referred for evaluation.  Pulmonary function tests are not done.  Her brain scan was also  negative.   PAST MEDICAL HISTORY:  Significant for diabetes mellitus type 2,  depression, bipolar disorder, back pain, and rheumatoid arthritis.   ALLERGIES:  She is allergic to penicillin and sulfa.   MEDICATIONS:  1. Naprosyn 220 mg p.r.n.  2. Furosemide 20 mg p.r.n.  3. Metformin 500 mg twice a day.  4. Methadone 140 mg daily.  5. Prednisone 20 mg daily.   FAMILY HISTORY:  Her father died of stomach cancer and her mother had  breast cancer.   SOCIAL HISTORY:  She is single, has one son.  She works as a Financial risk analyst.  She  has a history of drug abuse in the past and no history of alcohol use.   REVIEW OF SYSTEMS:  CARDIAC:  No angina or atrial fibrillation.  PULMONARY:  No hemoptysis or bronchitis.  GI:  No reflux, nausea, or vomiting.  GU:  No kidney disease, dysuria, or frequent urination.  VASCULAR:  No claudication, DVT, or TIAs.  NEUROLOGICAL:  No dizziness, headaches, blackouts, or seizures.  MUSCULOSKELETAL:  See history of present illness.  PSYCHIATRIC:  See history of present illness.  EYE/ENT:  No change in her eyesight  or hearing.  HEMATOLOGICAL:  No problems with bleeding, clotting disorders, or  anemia.   PHYSICAL EXAMINATION:  General:  She is a slightly obese Caucasian  female, in no acute distress.  Head, Eyes, Ears, Nose, and Throat:  Unremarkable.  Neck:  Supple without thyromegaly.  Chest:  Clear to  auscultation and percussion.  Heart:  Regular, sinus rhythm.  Abdomen:  Obese.  There is no hepatosplenomegaly.  Extremities:  Pulses are 2+.  There is no clubbing or edema.  Neurologic:  She is oriented x3.  Sensory and motor intact.   I think we need to find out about her lymph nodes as well as her  pulmonary function tests.  I will schedule for pulmonary function tests  and then we will proceed with a further bronchoscopy with endobronchial  ultrasound.  If that is negative, then we will proceed with a right  upper lobectomy.  I explained this in detail with the patient and she  agrees to the surgery.   Sincerely,   Ines Bloomer, M.D.  Electronically Signed   DPB/MEDQ  D:  03/03/2010  T:  03/04/2010  Job:  [id number]   cc:   Quitman Livings, MD

## 2010-09-30 NOTE — Op Note (Signed)
NAMEAMIAH, FROHLICH                          ACCOUNT NO.:  [id number]   MEDICAL RECORD NO.:  [id number]                   PATIENT TYPE:  AMB   LOCATION:  DSC                                  FACILITY:  [facility]   PHYSICIAN:  Lubertha Basque. Jerl Santos, M.D.             DATE OF BIRTH:  14-Dec-1955   DATE OF PROCEDURE:  09/16/2002  DATE OF DISCHARGE:                                 OPERATIVE REPORT   PREOPERATIVE DIAGNOSIS:  Left ankle fracture.   POSTOPERATIVE DIAGNOSIS:  Left ankle fracture.   PROCEDURE:  Open reduction and internal fixation, left ankle fracture.   ANESTHESIA:  General.   SURGEON:  Lubertha Basque. Jerl Santos, M.D.   ASSISTANT:  Lindwood Qua, P.A.   INDICATION FOR PROCEDURE:  The patient is a 55 year old woman who tripped  recently and sustained a displaced fracture of the left  ankle.  She was  placed in a splint and offered ORIF in hopes of realigning the congruity of  her joint and minimizing chance of further ankle difficulty.  Informed  operative consent was obtained after discussion of the possible  complications of, reaction to anesthesia, and infection, as well as ankle  stiffness.  Neuroma formation was also discussed.   DESCRIPTION OF PROCEDURE:  The patient was taken to the operating suite,  where a general anesthetic was applied without difficulty.  She was  positioned supine and prepped and draped in the normal sterile fashion.  After the administration of preop antibiotics, the left leg was elevated,  exsanguinated, and a tourniquet inflated about the calf.  A lateral incision  was made centered at the fracture site with dissection down to this site.  Soft tissue was removed from the fracture site, followed by an anatomic  reduction of the fracture with a clamp.  A single interfragmentary screw  from the small-fragment set was placed, which was a fully-threaded 3.5 mm  Synthes screw, cortical.  Fluoroscopy was used to confirm adequate reduction  of fracture  and placement of interfragmentary screw.  Further stabilization  was then added with a one-third tubular six-hole side plate.  I used five  bicortical purchase screws and a single distal fully-threaded cancellous  screw to secure this plate to the fibula.  Fluoroscopy was used in two  planes to confirm adequate placement of the hardware and reduction of the  fracture.  The wound was then irrigated, followed by release of tourniquet.  Deep tissues were reapproximated with 0 and 2-0 undyed Vicryl, followed by  skin closure with staples.  Some Marcaine was injected about the incision  site, followed by Adaptic and dry gauze dressing with a posterior splint of  plaster with the ankle in neutral position.  Estimated blood loss and  intraoperative fluids can be obtained from anesthesia records, as can  accurate tourniquet time.   DISPOSITION:  The patient was extubated in the operating room and taken to  the  recovery room in stable condition.  Plans were for her to go home the  same day and follow up in the office in less than a week.  I will contact  her by phone tonight.                                              Lubertha Basque Jerl Santos, M.D.   PGD/MEDQ  D:  09/16/2002  T:  09/17/2002  Job:  [id number]

## 2010-09-30 NOTE — H&P (Signed)
NAMEEZRI, FANGUY                          ACCOUNT NO.:  [id number]   MEDICAL RECORD NO.:  [id number]                   PATIENT TYPE:  IPS   LOCATION:  0500                                 [facility]ILITY[facility]  [facility]   PHYSICIAN:  Jeanice Lim, M.D.              DATE OF BIRTH:  08/17/1955   DATE OF ADMISSION:  10/21/2002  DATE OF DISCHARGE:  10/30/2002                         PSYCHIATRIC ADMISSION ASSESSMENT   55-year-old single white female involuntarily committed on October 21, 2002.   HISTORY OF PRESENT ILLNESS:  The patient presents with a history of  intentional overdose, taking three Ambien tablets.  The patient states that  she wanted to take these pills so she could sleep as long as her boyfriend  was.  She states that she was upset with her boyfriend because he would not  take her to the [facility] after discharge.  She states that her boyfriend  did not think she was ready to go yet.  Papers report psychotic symptoms,  positive auditory hallucinations, and flight of ideas.  The patient denies  any suicidal or homicidal thoughts.  Denies any psychotic symptoms and  states that she did get mad in the emergency department and wanted to go  home.   PAST PSYCHIATRIC HISTORY:  Second hospitalization at behavioral health  center.  She was originally discharged two days prior.  Discharged on October 20, 2002 for manic behavior.  She has a history of bipolar disorder for the  past 20 years, was hospitalized at [facility] in 2003 for psychotic  symptoms.   SOCIAL HISTORY:  She is a 55 year old single white female.  She has a 53-  year-old child.  The patient states that when she was discharged she went  home with her boyfriend who is 4 years of age.  She has a 12th grade  education.   FAMILY HISTORY:  Mother with bipolar disorder, currently on Zoloft.   ALCOHOL AND DRUG HISTORY:  She is a nonsmoker.  The patient states she use  to drink.  Denies any currently.  Smokes  marijuana and has a history of  crack cocaine use.  Primary care [facility] Minion is Dr. Kerin Salen in [place].   MEDICAL PROBLEMS:  Fracture of her left leg in April 2004.  Currently has  some hardware in place.  Also stress incontinence.   MEDICATIONS:  1. Seroquel 300 mg at bedtime.  2. Neurontin 300 mg q.i.d.  3. Detrol-LA one q.a.m.  4. Geodon 80 mg b.i.d.  5. Protonix 40 mg q.a.m.  6. Eskalith 150 mg in the morning 450 mg, at bedtime.  7. Ambien 10 mg q.h.s.   DRUG ALLERGIES:  XANAX.   PHYSICAL EXAMINATION:  Was done at [facility].  The patient has some  difficulty with ambulation.  She uses a wheelchair.  She does have some  swelling noted bilaterally to her lower legs.  Her alcohol level is  less  than 5.  Acetaminophen level less than 10.  Urine drug screen is positive  for benzo.  Urine pregnancy test is negative.  Acetaminophen level less than  10.  Salicylate level less than 4.   MENTAL STATUS EXAM:  She is alert, oriented, middle-aged female, wanting to  talk, fair eye contact.  Speech is clear.  The patient feels anxious and  upset.  The patient is anxious, crying, when the patient was notified that  she would need to stay for observation.  Thought processes are coherent.  There appears to be no evidence of psychosis.  She does not appear to be  responding to internal stimuli.  She is focused on returning home.  Cognitive function is intact.  Memory is impaired.  Judgment and insight are  poor.  Poor impulse control.   DIAGNOSES:   AXIS I:  Bipolar disorder.   AXIS II:  Deferred.   AXIS III:  1. Fracture of left ankle in April 2004.  2. Stress incontinence.   AXIS IV:  Other psychosocial problems, medical problems.   AXIS V:  Current is 25 to 35.  Past year 27.   PLAN:  Involuntarily admit for intentional overdose.  Manic behavior.  Contract for safety.  Check every 15 minutes.  The patient will be placed on  the 400 hall for close monitoring.  Will stabilize her  mood and thinking.  Will resume her medications.  Will check her lithium level.  Medication  compliance was discussed with the patient.  The patient is to increase her  coping skills by attending group.  The patient is to increase her coping  skills by attending group.  She will have family session with her boyfriend  if possible.  The patient is to follow up at mental health and our goal is  for the patient to remain drug free.  Tentative length of stay is three to  five days.      Landry Corporal, N.P.                       Jeanice Lim, M.D.    JO/MEDQ  D:  11/24/2002  T:  11/24/2002  Job:  [id number]

## 2010-09-30 NOTE — H&P (Signed)
NAMEJILLIENNE, [name]                          ACCOUNT NO.:  [id number]   MEDICAL RECORD NO.:  [id number]                   PATIENT TYPE:  IPS   LOCATION:  0507                                 FACILITY:  [facility]   PHYSICIAN:  Jeanice Lim, M.D.              DATE OF BIRTH:  1955/11/26   DATE OF ADMISSION:  11/05/2002  DATE OF DISCHARGE:  11/07/2002                         PSYCHIATRIC ADMISSION ASSESSMENT   DATE OF ASSESSMENT:  November 05, 2002   PATIENT IDENTIFICATION:  This is a 55 year old single white female who is a  voluntary admission.   HISTORY OF PRESENT ILLNESS:  This patient with a history of bipolar disorder  was recently discharged on June 17 and referred by the outpatient clinic for  increased agitated and manic behavior.  The patient endorses fast thoughts  and feeling anxious, not able to be still.  She reports medication  compliance, counting on her sister to give her her medications on a regular  basis.  She denies any suicidal ideation or homicidal ideation, auditory and  visual hallucinations.  Her insight has been poor.  The patient was somewhat  inappropriate this morning in group therapy at the outpatient clinic and was  subsequently referred.  There was some concern that she may be lithium  toxic.   PAST PSYCHIATRIC HISTORY:  This is the patient's third admission to [facility].  She has also been admitted here on June 8  and May 29, also for mania.  The patient has a history of bipolar disorder  for the past 20 years and she has a history of prior suicide attempts by  overdose with Ambien.  The patient has been followed in the [facility].   SUBSTANCE ABUSE HISTORY:  The patient has a history of alcohol abuse,  marijuana and crack use.  She recently reports that she has been using only  marijuana, smokes about one joint a day and does this for the past 25 years.  She also abuses tobacco, smoking about one  pack per day.   PAST MEDICAL HISTORY:  The patient's primary care [name] is not clear.  Medical problems include the patient is status post fracture of her left  lower leg with open reduction internal fixation in April 2004 and she uses  either a wheelchair at times for long distances but is able to ambulate  short distances with a cane.  The patient also reports that she has had  loose stools, usually having to get up in the middle of the night with  diarrhea when her lithium gets up around 900 mg q.h.s.  She is able to  tolerate less a little bit better.  The patient also complains of stress  incontinence.   MEDICATIONS:  1. Eskalith 450 mg two tablets q.h.s.  2. Depakote 250 mg three tablets q.h.s.  3. Protonix 40 mg a.c.  4.  Vioxx 25 mg q.a.m.  5. Zyprexa 15 mg p.o. q.h.s.   DRUG ALLERGIES:  XANAX, VALIUM, and ZOLOFT, and there is some question about  if the patient has a SULFA allergy that has caused her a rash in the past.   PHYSICAL EXAMINATION:  GENERAL:  This is a well nourished, well developed  female who is in no acute distress.  VITAL SIGNS:  On admission to the unit, temperature 97.2, pulse 94,  respirations 18, blood pressure 111/67.  She is 5 feet 9 inches, 165 pounds.  HEENT:  Head is normocephalic, atraumatic.  EENT: PERRLA.  Sclerae are  nonicteric.  No rhinorrhea.  Oropharynx: In satisfactory condition,  noninjected.  NECK:  Supple without thyromegaly.  CARDIOVASCULAR:  S1 and S2, no clicks, murmurs, or gallops.  Regular rate  and rhythm synchronous with radial pulse.  CHEST:  Lungs are clear to auscultation.  Chest is symmetrical with normal  excursion.  ABDOMEN:  Rounded, soft, nontender, nondistended, no masses appreciated.  GENITALIA:  Deferred.  MUSCULOSKELETAL:  The patient is walking with a very slight limp.  She is  able to ambulate well with steady gait with a cane.  Strength is 5/5  throughout.  She is able to attend to her own ADL.  NEUROLOGIC:   Cerebellar function appears intact.  Cranial nerves II-XII are  intact.  EOM are intact, no nystagmus.  No tremor, no signs of ataxia or  akathisia, no overt symptoms of lithium toxicity reported subjectively.  Facial symmetry is present.  Grip strength: Equal bilaterally.  Romberg:  Without findings.  No focal findings.   SOCIAL HISTORY:  This is a 55 year old single white female who likes to  garden.  She lives at home with her boyfriend, has one grown son and one  granddaughter.  She has a basic high school education.  No legal problems.  She is not employed outside the home.   FAMILY HISTORY:  Family history is remarkable for a mother with bipolar  disorder and a son with bipolar disorder.   MENTAL STATUS EXAM:  This is a fully alert patient who is oriented.  She is  appropriately dressed with appropriate affect.  Somewhat pressured and rapid  speech.  Her affect is labile, smiling one minute and then tearful at times  when she discusses some of her stressors and her struggles with her mood.  Speech is rapid and pressured.  Mood is anxious and labile.  Thought process  reveals poor insight.  She is very concrete at this time, distractible and  somewhat tangential.  No overt suicidal or homicidal ideation.  Cognitive:  Intact and oriented x 3.   ADMISSION DIAGNOSES:   AXIS I:  1. Bipolar I disorder, manic.  2. Ethyl alcohol abuse in remission.   AXIS II:  Deferred.   AXIS III:  1. Status post fracture of left lower leg.  2. Stress incontinence.   AXIS IV:  Deferred.   AXIS V:  Current 28, past year 29.   INITIAL PLAN OF CARE:  Plan is to voluntarily admit the patient with q.79m.  checks in place.  We will check a lithium level stat and will hold any  further lithium doses until we have her results back.  Meanwhile, we are  going to check a urinalysis and metabolic panel and other routine labs including a CBC.  We will not recheck her thyroid panel at this time since  we  had just done one previously two weeks ago.  We are going to restart her  previous  medications and add Zyprexa 5 mg q.4h. p.r.n. for agitation, Vistaril 5 mg  q.4h. p.r.n. for anxiety, and Librium 25 mg q.6.h. p.r.n. for anxiety if it  is not relieved by the Vistaril.  The patient is in agreement with plan.   ESTIMATED LENGTH OF STAY:  Between four and six days.     Margaret A. Stephannie Peters                   Jeanice Lim, M.D.    MAS/MEDQ  D:  02/04/2003  T:  02/06/2003  Job:  [id number]

## 2010-09-30 NOTE — Discharge Summary (Signed)
[name], [name]                          ACCOUNT NO.:  [id number]   MEDICAL RECORD NO.:  [id number]                   PATIENT TYPE:  IPS   LOCATION:  0500                                 [facility]ACILITY:  [facility]   PHYSICIAN:  Jeanice Lim, M.D.              DATE OF BIRTH:  1955/09/23   DATE OF ADMISSION:  10/21/2002  DATE OF DISCHARGE:  10/30/2002                                 DISCHARGE SUMMARY   IDENTIFYING INFORMATION:  This is a 55 year old single Caucasian female  involuntarily committed with a history of intentional overdose, positive  flight of ideas. She states she was upset with  her boyfriend, episodically  homicidal prior to admission.   MEDICATIONS:  Seroquel, Neurontin, Detrol, Geodon, Protonix, Eskalith and  Ambien.   ALLERGIES:  Xanax.   PHYSICAL EXAMINATION:  Essentially within normal limits. Neurologically  nonfocal.   LABORATORY DATA:  Routine admission labs alcohol level less than 5. Urine  pregnancy test negative. Urine drug screen positive for benzodiazepines.   MENTAL STATUS EXAM:  An alert and oriented middle-aged female. Speech clear  and mood anxious, upset. Thought process goal directed, thought content  negative for any dangerous, or psychotic symptoms. Cognitively intact.  Judgment and insight poor.   ADMISSION DIAGNOSES:   AXIS I:  Bipolar disorder type 1.   AXIS II:  Deferred.   AXIS III:  Fracture of the left ankle.   AXIS IV:  Minor problems with psychosocial issues and medical problems.   AXIS V:  25/65.   HOSPITAL COURSE:  The patient was admitted and we ordered routine  p.r.n.  medications. She underwent further monitoring for safety. She was placed on  every 15 minute checks. Her lithium level  was followed  up. A hospital  session with her boyfriend was arranged and aftercare planning began.  She  was encouraged to participate in individual, group and milieu therapy.   Initially she had stuttering and pressured speech. Her mood  was labile,  tearful and euphoric at times with rapid swings. Angry at times. She was  feeling out of control. She was denying purposely having overdosed. The  patient required frequent redirection, complaining of smelling from her  vagina. Apparently she placed things in her vagina to get it clean. Her gait  was somewhat stiff, holding hands rigid.   Her mood remained labile. She had poor judgment and insight. She was  gradually discontinued off of Geodon and continued on Zyprexa and optimized  on Cogentin for extrapyramidal symptoms and Depakote was added. Her lithium  level was monitored. The patient had some improvement.   She was quite adamant about discharged as soon as possible. Her mood was  becoming more stable. The patient reported no dangerous ideation or  psychotic symptoms. She  had some improvement in judgment and insight. She  was stabilized on medications. The patient was agreeable  to follow up with  an  intensive outpatient program.   Her condition on discharge was improved. Her mood was more euthymic, affect  brighter, thought process is goal directed, thought content negative for any  dangerous ideation or psychotic symptoms. The patient reported motivation to  be compliant with the aftercare plan. The patient had no extrapyramidal  symptoms at the time of discharge. She was responding  to the Cogentin and  the changes in medications. She reported motivation not to abuse  benzodiazepines and to stay on her medications.   DISCHARGE MEDICATIONS:  1. Zyprexa 15 mg q.h.s.  2. Vioxx 25 mg q. a.m.  3. Lithium 900 mg q. 8 p.m.  4. Cogentin 200 mg q. a.m. and q. 8 p.m.  5. Depakote 250 mg 3 q. 8 p.m.  6. Ambien 10 mg p.o. q. 9 p.m.  7. Protonix 40 mg q. p.m.  8. No Geodon, no Motrin, no Seroquel, no Neurontin, no benzodiazepine, no     cannabis, no alcohol, no cocaine.   FOLLOW UP:  The patient was to have dental follow up and to follow up in an  intensive outpatient  program.   DISCHARGE INSTRUCTIONS:  The importance of medication compliance was  stressed as well as not abusing substances that destabilize her mood and  impair her judgment.   DISCHARGE DIAGNOSES:   AXIS I:  Bipolar disorder type 1.   AXIS II:  Deferred.   AXIS III:  Fracture of the left ankle.   AXIS IV:  Minor problems with psychosocial issues and medical problems.   AXIS VKallie Locks, M.D.    JEM/MEDQ  D:  11/26/2002  T:  11/28/2002  Job:  [id number]

## 2010-09-30 NOTE — Discharge Summary (Signed)
[name], [name]                          ACCOUNT NO.:  [id number]   MEDICAL RECORD NO.:  [id number]                   PATIENT TYPE:  IPS   LOCATION:  0402                                 FACILITY:  [facility]   PHYSICIAN:  Jeanice Lim, M.D.              DATE OF BIRTH:  12/23/1955   DATE OF ADMISSION:  10/11/2002  DATE OF DISCHARGE:  10/20/2002                                 DISCHARGE SUMMARY   IDENTIFYING DATA:  This is a Caucasian female with a history of bipolar  manic disorder.  She was quite agitated at the time of admission with clear  mood lability.  The patient reported doing better once started on lithium  and Geodon.  The patient admitted to abusing cannabis, alcohol, Xanax, crack  cocaine.  She had no cocaine for nine months.   DRUG ALLERGIES:  Possibly to [facility], as per the patient.   MENTAL STATUS EXAM:  Difficult to orient, disheveled.  Speech: Rapid,  pressured.  Mood: Labile.  Thought process: Disorganized.  Goal directed.  Thought content: Perseverating.  Cognitive: Impaired.  Judgment and insight:  Poor.   ADMISSION DIAGNOSES:   AXIS I:  1. Bipolar disorder, manic.  2. Substance-induced mood disorder.   AXIS II:  Deferred.   AXIS III:  Recent fracture to left ankle.   AXIS IV:  Moderate problems with primary support group.   AXIS V:  25/65   HOSPITAL COURSE:  The patient was admitted, ordered routine p.r.n.  medications, received IM Geodon, was started in lithium and Geodon to  stabilize mood and Seroquel to restore sleep.  The patient required one-to-  one due to severe disorganized thinking and need for redirection for safety.  The patient was a fall risk, needing to be careful when walking due to  recent fracture but would not follow directions at times.  The patient  slowly improved with stabilization on medications, showing improvement in  judgment and insight, denying any dangerous ideation, becoming more  appropriate on the unit.   CONDITION ON DISCHARGE:  The patient was discharged in improved condition.  Mood was more stable.  Affect: Brighter.  Thought processes: Goal directed.  Thought content: Negative for dangerous ideation or psychotic symptoms.  There were no extrapyramidal symptoms or side effects from medications  reported at the time of discharge.   DISCHARGE MEDICATIONS:  1. Seroquel 300 mg q.h.s.  2. Neurontin 300 mg q.i.d.  3. Motrin.  4. Detrol.  5. Geodon 80 mg b.i.d.  6. Protonix.  7. Eskalith 450 mg two q.h.s. and 150 mg q.a.m.  8. Ambien 10 mg q.h.s.   FOLLOW UP:  The patient was discharged to follow up with [facility] on June 8 at 9 a.m.   DISCHARGE DIAGNOSES:   AXIS I:  1. Bipolar disorder, manic.  2. Substance-induced mood disorder.   AXIS II:  Deferred.   AXIS III:  Recent fracture to left ankle.   AXIS IV:  Moderate problems with primary support group.   AXIS V:  Global assessment of functioning on discharge was 50-55.                                               Jeanice Lim, M.D.    JEM/MEDQ  D:  11/26/2002  T:  11/28/2002  Job:  [id number]

## 2010-09-30 NOTE — H&P (Signed)
NAMEORAL, HALLGREN                          ACCOUNT NO.:  [id number]   MEDICAL RECORD NO.:  [id number]                   PATIENT TYPE:  IPS   LOCATION:  0402                                 [facility]ACILITY[facility]  [facility]   PHYSICIAN:  Jeanice Lim, M.D.              DATE OF BIRTH:  [birth year]   DATE OF ADMISSION:  10/11/2002  DATE OF DISCHARGE:                         PSYCHIATRIC ADMISSION ASSESSMENT   IDENTIFYING INFORMATION:  Comes from the patient and the records.  This is a  55 year old white single female.   REASON FOR ADMISSION:  She has been depressed and manic for the past week.  She is currently having suicidal ideation.  She has made attempts at suicide  in the past.  She is exhibiting poor impulse control, poor boundaries.  Her  mood is labile.  She reports Jesus is talking to her.  She has had decreased  sleep in the past week, 1-3 hours per day only.  Her speech is rapid,  pressured.  She has flight of ideas and is easily distracted.   PAST PSYCHIATRIC HISTORY:  She states she has been bipolar for 20 years but  she says she only began to get treatment three years ago.  Her first [facility] admission was in 2001 and second in 2002.  This would make her third  psychiatric admission.  The patient states that, the way she came to be  hospitalized originally, was mixing alcohol and Xanax.  This caused her to  pass out and her son took her to the emergency room.  There, emergency room  staff asked would he be willing to send her for help.  He was and, hence,  she was first admitted.  The patient acknowledges noncompliance with  medications but is willing at this time to be restarted.   SOCIAL HISTORY:  She has completed the 12th grade.  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  She has never been married, although she has lived  in adultery x 2.  She does have a 43 year old son.  She feels he is also  bipolar and needs help.   FAMILY HISTORY:  She states her mother is  bipolar and takes Zoloft for her  bipolar illness.   ALCOHOL/DRUG HISTORY:  The patient smokes THC but says she does not abuse  it.  She states she has not used crack in the past nine months.  She only  drinks alcohol when using Xanax to blackout.   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  Medical problems include she does have bladder spasms for  which she takes Detrol LA 4 mg q.d.  She recently stepped off a curb and  fractured her left lateral ankle.  She underwent surgery on or about August 29, 2002.   ALLERGIES:  She states she is allergic to [facility].   POSITIVE PHYSICAL FINDINGS:  She has a well-healed surgical scar on the left  lateral ankle.  She has a probable little lipoma subcutaneously beneath her  left nerve clavicle.  The remainder of her physical examination showed that  her lungs were clear to auscultation and percussion.  Her heart had a  regular rate and rhythm.  Her abdomen was soft with no palpable tenderness,  mass or megaly.  Her musculoskeletal revealed a walking cast that can be  velcroed on the left lower extremity.  She has a well-healed surgical  incision on the left lateral ankle.  She does have some skin changes and  some fluid in her left lower extremities.  She does have some skin changes  and some fluid on the left lower extremity status post her surgical repair  of her break.   REVIEW OF SYSTEMS:  She denies any symptoms of medical illness and states  that she is due to have a pap smear and mammogram.   MENTAL STATUS EXAM:  She is alert.  She can be oriented.  She is neat.  She  is clean.  She has appropriate clothes on.  Her weight is normal.  Her  speech is rapid and pressured.  Her mood is labile.  She does acknowledge  auditory hallucinations with suicidal ideation.  She is scattered,  disorganized, grandiose, persecutory.  Memory and concentration are poor.  Insight and judgment are poor.  Her intelligence  is average.   DIAGNOSES:   AXIS I:  Bipolar disorder, manic, versus substance-induced mania.   AXIS II:  No diagnosis.   AXIS III:  1. Recent fracture of left ankle.  2. Status post surgery.  3. Cough incontinence of her bladder.   AXIS IV:  Moderate.   AXIS V:  She is currently 55 and, in the past year, probably 55.   PLAN:  Reestablish medications to control mania.  She needs substance abuse  treatment for THC abuse/dependence.  She also needs to be evaluated for her  alcohol use and follow-up care in the community needs to be established.     Vic Ripper, P.A.-C.               Jeanice Lim, M.D.    MD/MEDQ  D:  10/12/2002  T:  10/12/2002  Job:  [id number]

## 2010-09-30 NOTE — Discharge Summary (Signed)
[name], [name]                          ACCOUNT NO.:  [id number]   MEDICAL RECORD NO.:  [id number]                   PATIENT TYPE:  IPS   LOCATION:  0507                                 [facility]ILITY[facility]  [facility]   PHYSICIAN:  Jeanice Lim, M.D.              DATE OF BIRTH:  03-Jun-1955   DATE OF ADMISSION:  11/05/2002  DATE OF DISCHARGE:  11/07/2002                                 DISCHARGE SUMMARY   IDENTIFYING DATA:  This is a 55 year old single Caucasian female voluntarily  admitted with a history of bipolar disorder, recently discharged on October 30, 2002, [facility] due to increased agitation and manic-like  behaviors, fast thoughts, and possible side effects from medications with  poor insight.   MEDICATIONS:  1. Eskalith 450 mg two tabs q.h.s.  2. Depakote 250 mg three q.h.s.  3. Protonix.  4. Vioxx.  5. Zyprexa 50 mg q.h.s.   DRUG ALLERGIES:  1. XANAX.  2. VALIUM.  3. ZOLOFT.   PHYSICAL EXAMINATION:  GENERAL:  Physical exam was essentially within normal  limits.  NEUROLOGICAL:  Nonfocal.   LABORATORY DATA:  Routine admission labs were within normal limits.   MENTAL STATUS EXAM:  Alert and oriented, appropriately dressed.  Affect  somewhat expansive with pressured, rapid speech.  Mood was anxious and  affect labile.  Thought process was somewhat scattered, tangential, very  concrete, irritable, distractible.  Cognition intact.  Judgment and insight  poor.   ADMISSION DIAGNOSES:   AXIS I:  1. Bipolar disorder, mixed phase.  2. Alcohol as well as benzodiazepine, cocaine, and cannabis abuse history,     in partial remission.   AXIS II:  Deferred.   AXIS III:  1. Status post fracture of left lower leg.  2. Stress incontinence.   AXIS IV:  Moderate problems with primary support group and substance abuse  problems.   AXIS V:  35/55 to 60.   HOSPITAL COURSE:  Patient was admitted, ordered routine p.r.n. medications,  underwent further  monitoring, was encouraged to participate in the  individual, group, and milieu therapy.  Medications were simplified due to  questionable side effects of the medications.  Patient appeared actually to  be doing better than she had been doing before with increased insight and no  clear side effects of lithium.  Medications were simplified.  The patient  reported a positive response and no side effects.  Family also felt the  patient was doing well.   CONDITION AT DISCHARGE:  She was discharged in improved condition.  Mood was  more euthymic, affect brighter, thought process goal-directed, thought  content negative for dangerous ideation or psychotic symptoms.  Patient was  discharged on medications.   DISCHARGE MEDICATIONS:  1. Cogentin twice per day.  2. Protonix 40 mg q.a.m.  3. Vioxx 25 mg q.a.m.  4. Zyprexa Zydis 15 mg q.h.s.  5. Eskalith CR 450  mg b.i.d.  6. Ambien 10 mg q.h.s.  7. Imodium p.r.n.   FOLLOW UP:  Patient is to follow up with intensive outpatient program  beginning on Monday, November 10, 2002, at 9 a.m.   DISCHARGE DIAGNOSES:   AXIS I:  1. Bipolar disorder, mixed phase.  2. Alcohol as well as benzodiazepine, cocaine, and cannabis abuse history,     in partial remission.   AXIS II:  Deferred.   AXIS III:  1. Status post fracture of left lower leg.  2. Stress incontinence.   AXIS IV:  Moderate problems with primary support group and substance abuse  problems.   AXIS V:  Global assessment of functioning on discharge was 55.                                               Jeanice Lim, M.D.    JEM/MEDQ  D:  12/04/2002  T:  12/05/2002  Job:  [id number]

## 2010-11-25 ENCOUNTER — Other Ambulatory Visit (HOSPITAL_COMMUNITY): Payer: Self-pay

## 2011-01-14 IMAGING — CR DG CHEST 1V PORT
1 series · 1 of 1 positions shown · non-contrast
Comparison: 03/22/2010

CLINICAL DATA: Right lung lesion, status post VATS, ventilatory
support

PORTABLE CHEST - 1 VIEW

[AP]
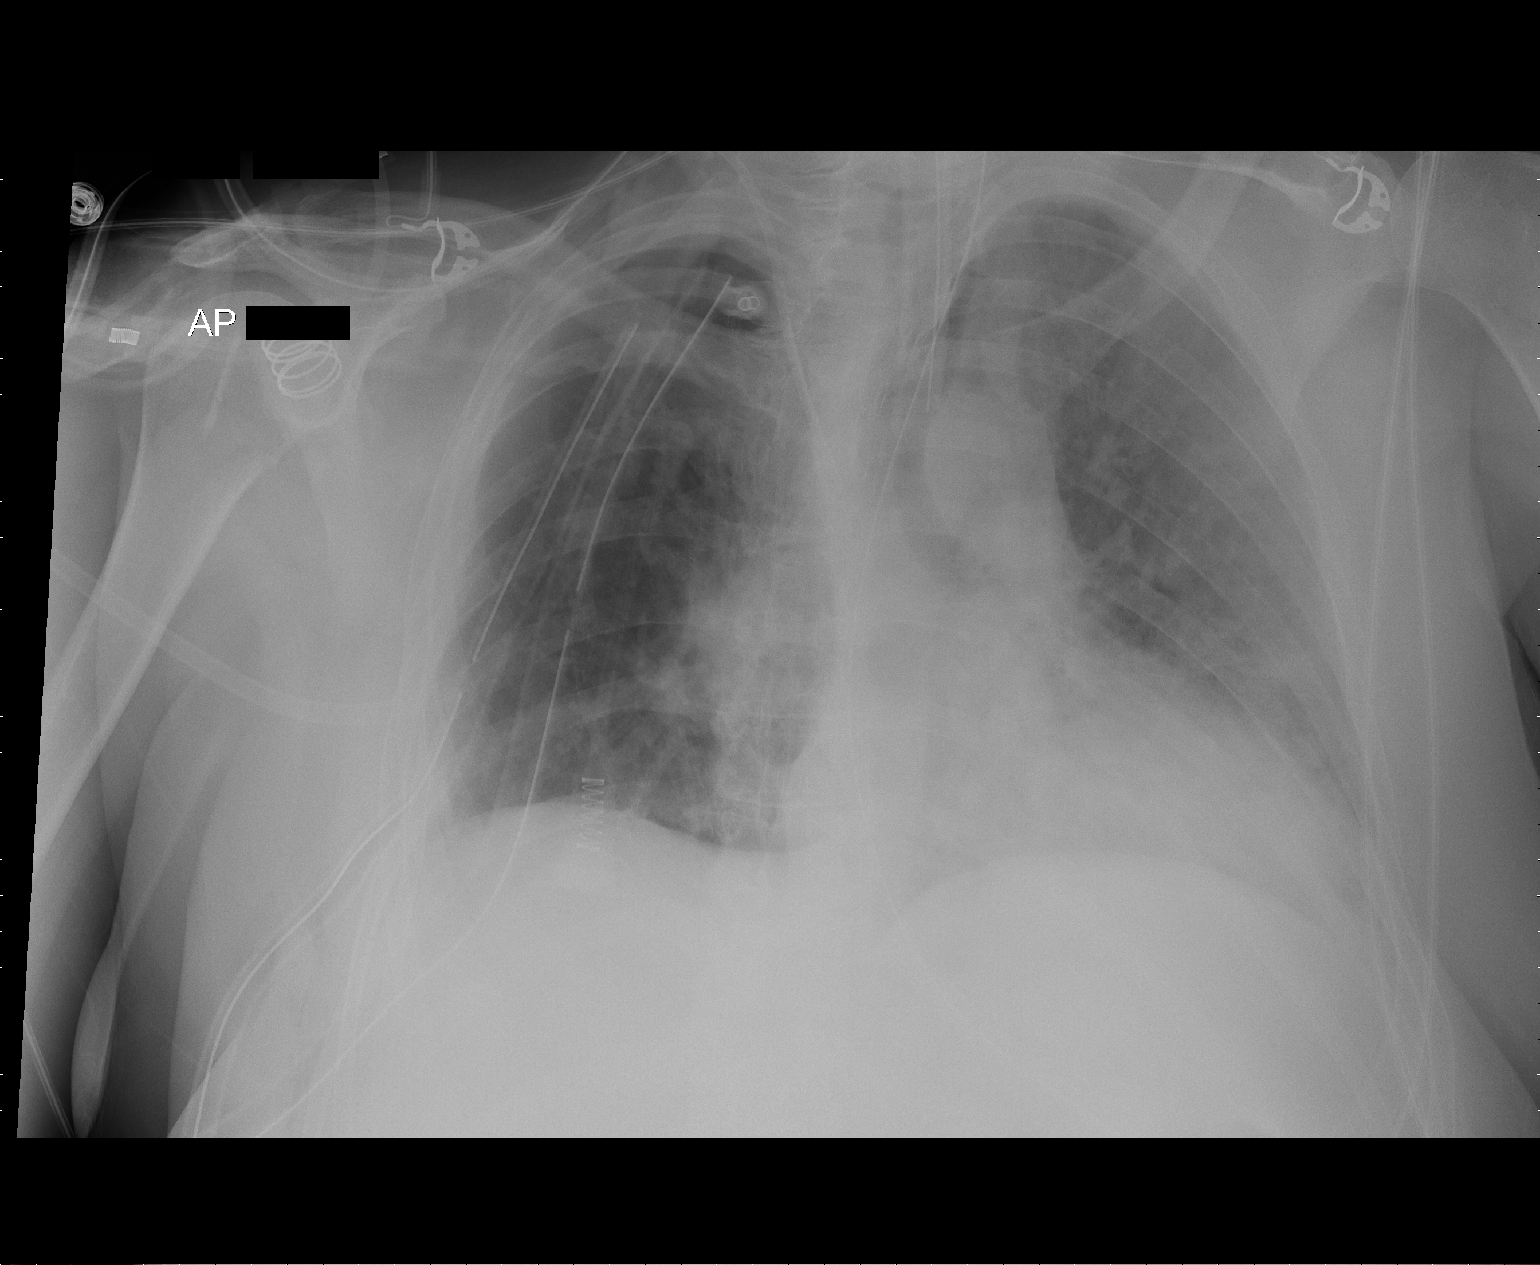

[1 of 1 positions shown; findings below may reference images not displayed]

FINDINGS: Stable support apparatus.  2 right chest tubes remain.
Postop changes in the right hemithorax with volume loss.  No
enlarging effusion or significant pneumothorax by plain
radiography.  Improvement in the diffuse edema pattern throughout
the left lung.
IMPRESSION: Stable postoperative findings.
Improving asymmetric left lung edema pattern.

## 2011-01-17 IMAGING — CR DG CHEST 2V
2 series · 2 of 2 positions shown · non-contrast
Comparison: 03/25/2010

CLINICAL DATA: Right upper lobe lesion, status post VATS

CHEST - 2 VIEW

[w chest pa]
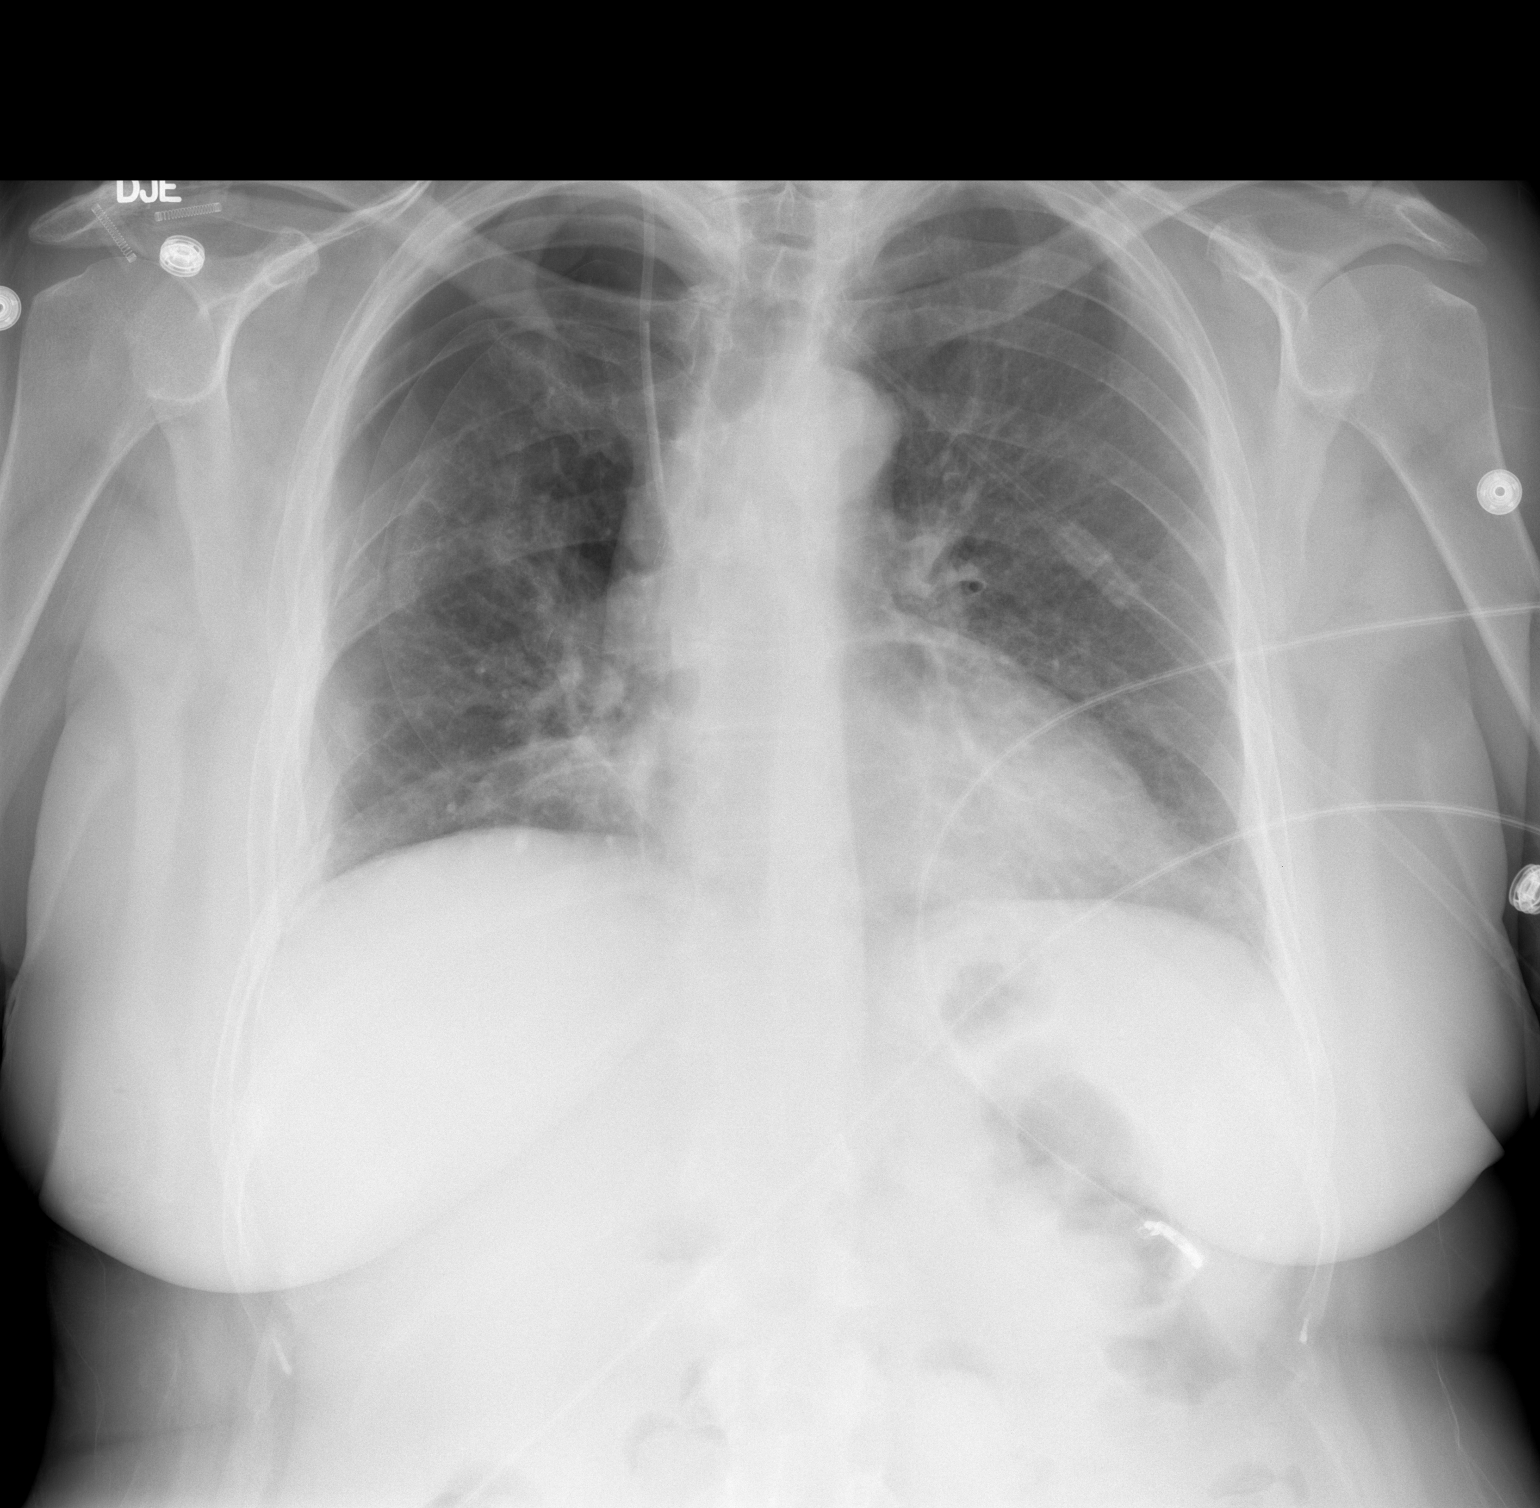

[w chest lat]
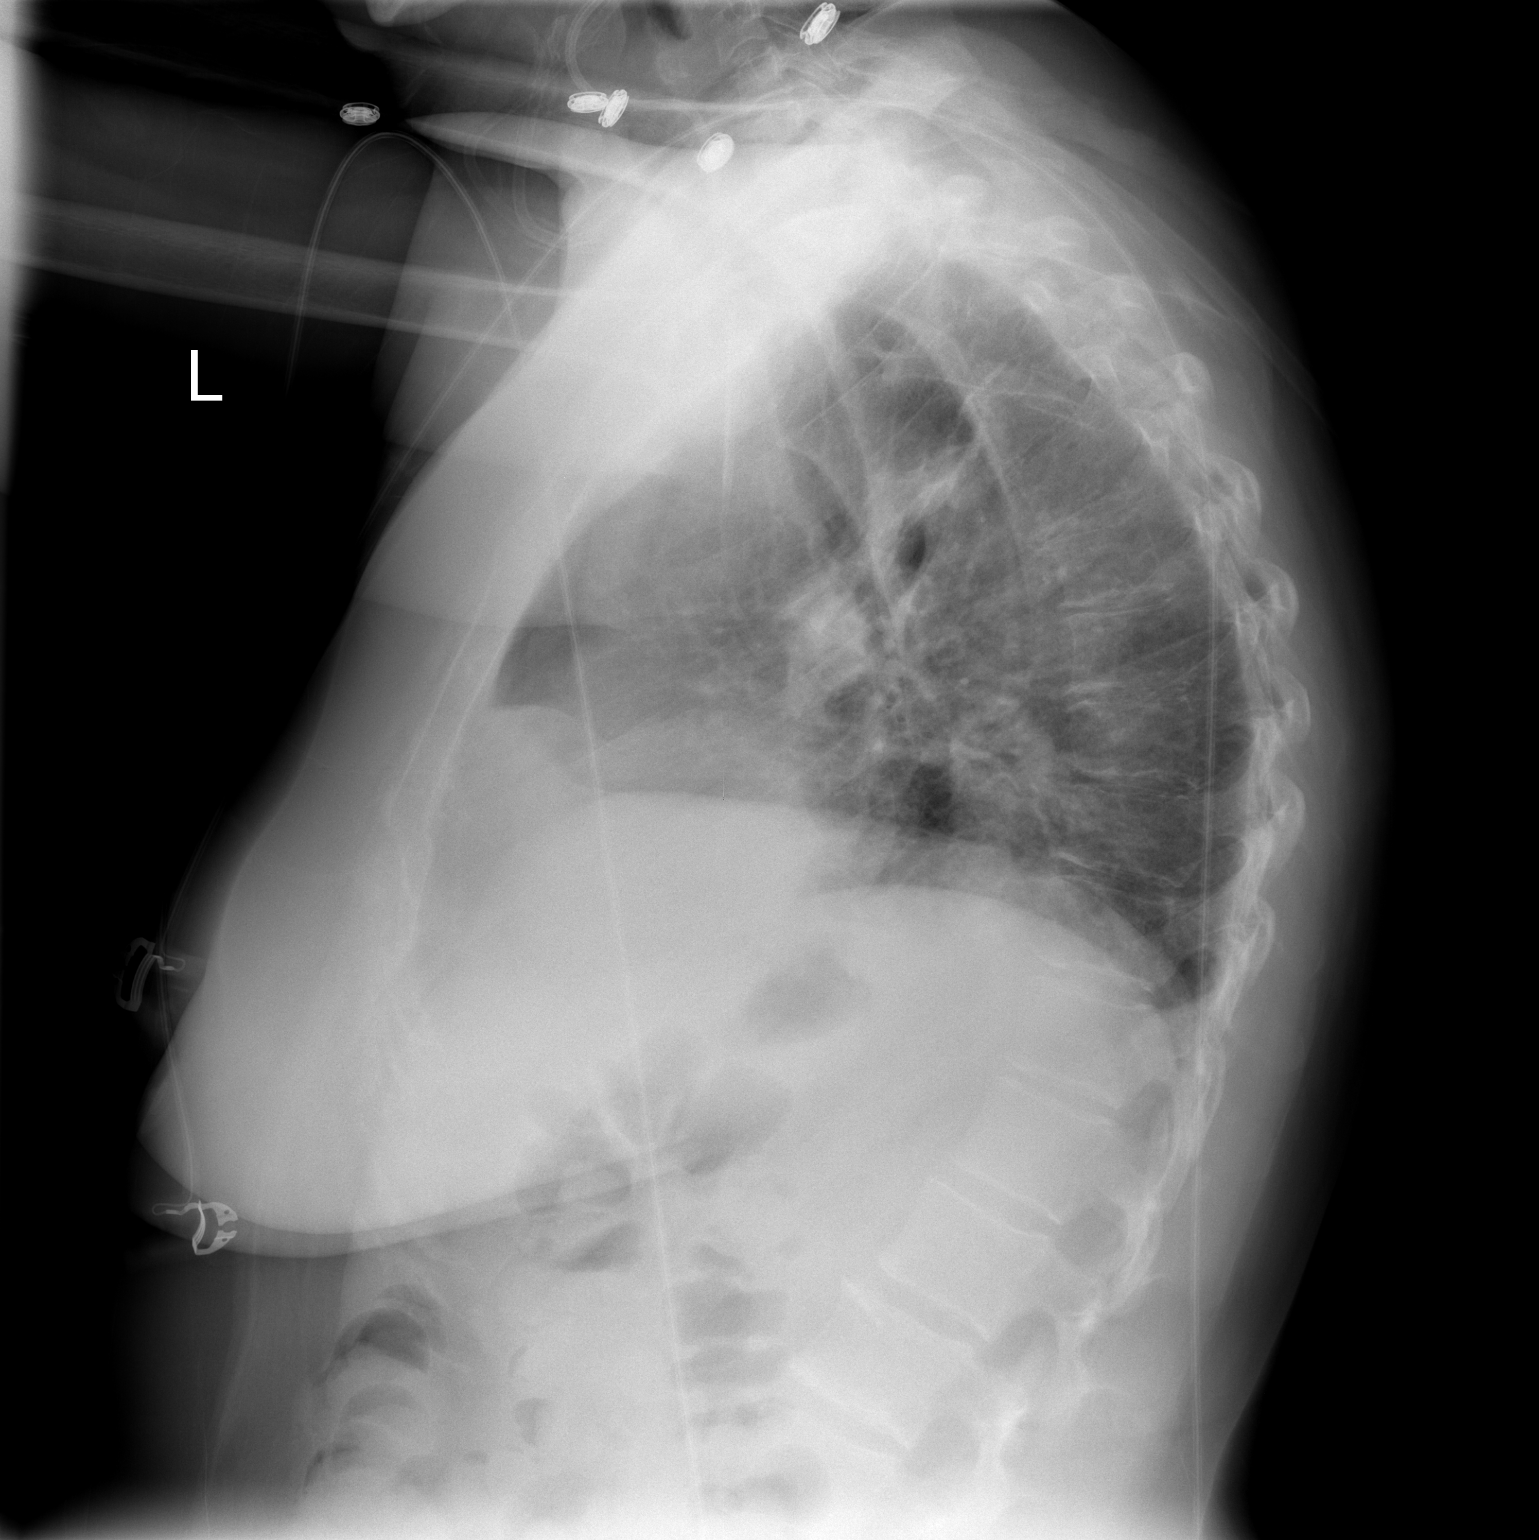

[2 of 2 positions shown; findings below may reference images not displayed]

FINDINGS: The right-sided chest tube has been removed.

There is a 25% right sided pneumothorax, new from the prior exam.

A right internal jugular central venous catheter tip:  SVC.

Wedge resection clips project over the aerated right upper lung.

Heart size is within normal limits.
IMPRESSION: 1.  The right-sided chest tube is no longer present.  There is a
25% right-sided pneumothorax.  No shift of mediastinal or midline
structures to suggest tension pneumothorax.

Critical test results telephoned to Dr. Eceter Sonono, who is
covering on-call for Dr. Zaes, at the time of interpretation on
03/26/2010 at [DATE] a.m.

## 2011-01-24 ENCOUNTER — Other Ambulatory Visit: Payer: Self-pay | Admitting: Internal Medicine

## 2011-01-24 ENCOUNTER — Encounter (HOSPITAL_BASED_OUTPATIENT_CLINIC_OR_DEPARTMENT_OTHER): Payer: Self-pay | Admitting: Internal Medicine

## 2011-01-24 ENCOUNTER — Ambulatory Visit (HOSPITAL_COMMUNITY)
Admission: RE | Admit: 2011-01-24 | Discharge: 2011-01-24 | Disposition: A | Discharge status: Home / Self Care | Payer: Self-pay | Source: Ambulatory Visit | Attending: Internal Medicine | Admitting: Internal Medicine

## 2011-01-24 DIAGNOSIS — C3410 Malignant neoplasm of upper lobe, unspecified bronchus or lung: Secondary | ICD-10-CM

## 2011-01-24 DIAGNOSIS — C3490 Malignant neoplasm of unspecified part of unspecified bronchus or lung: Secondary | ICD-10-CM | POA: Insufficient documentation

## 2011-01-24 DIAGNOSIS — D4959 Neoplasm of unspecified behavior of other genitourinary organ: Secondary | ICD-10-CM | POA: Insufficient documentation

## 2011-01-24 DIAGNOSIS — J984 Other disorders of lung: Secondary | ICD-10-CM | POA: Insufficient documentation

## 2011-01-24 LAB — CBC WITH DIFFERENTIAL/PLATELET
BASO%: 0.3 % (ref 0.0–2.0)
EOS%: 1.5 % (ref 0.0–7.0)
LYMPH%: 28.8 % (ref 14.0–49.7)
MCH: 33.8 pg (ref 25.1–34.0)
MCHC: 34.5 g/dL (ref 31.5–36.0)
MCV: 98.1 fL (ref 79.5–101.0)
MONO%: 4.2 % (ref 0.0–14.0)
Platelets: 206 10*3/uL (ref 145–400)
RBC: 3.98 10*6/uL (ref 3.70–5.45)
RDW: 14 % (ref 11.2–14.5)

## 2011-01-24 LAB — CMP (CANCER CENTER ONLY)
AST: 22 U/L (ref 11–38)
Alkaline Phosphatase: 118 U/L — ABNORMAL HIGH (ref 26–84)
Glucose, Bld: 95 mg/dL (ref 73–118)
Sodium: 144 mEq/L (ref 128–145)
Total Bilirubin: 0.3 mg/dl (ref 0.20–1.60)
Total Protein: 6.9 g/dL (ref 6.4–8.1)

## 2011-01-24 MED ORDER — IOHEXOL 300 MG/ML  SOLN
80.0000 mL | Freq: Once | INTRAMUSCULAR | Status: AC | PRN
  Administered 2011-01-24: 80 mL via INTRAVENOUS

## 2011-01-30 ENCOUNTER — Encounter (HOSPITAL_BASED_OUTPATIENT_CLINIC_OR_DEPARTMENT_OTHER): Payer: Self-pay | Admitting: Internal Medicine

## 2011-01-30 ENCOUNTER — Other Ambulatory Visit: Payer: Self-pay | Admitting: Internal Medicine

## 2011-01-30 DIAGNOSIS — C3490 Malignant neoplasm of unspecified part of unspecified bronchus or lung: Secondary | ICD-10-CM

## 2011-01-30 DIAGNOSIS — N2889 Other specified disorders of kidney and ureter: Secondary | ICD-10-CM

## 2011-01-30 DIAGNOSIS — N289 Disorder of kidney and ureter, unspecified: Secondary | ICD-10-CM

## 2011-01-30 DIAGNOSIS — Z85118 Personal history of other malignant neoplasm of bronchus and lung: Secondary | ICD-10-CM

## 2011-02-13 ENCOUNTER — Other Ambulatory Visit (HOSPITAL_COMMUNITY): Payer: Self-pay | Admitting: Urology

## 2011-02-13 DIAGNOSIS — N2889 Other specified disorders of kidney and ureter: Secondary | ICD-10-CM

## 2011-02-17 ENCOUNTER — Ambulatory Visit (HOSPITAL_COMMUNITY): Payer: Self-pay

## 2011-02-17 ENCOUNTER — Other Ambulatory Visit: Payer: Self-pay | Admitting: Urology

## 2011-02-17 ENCOUNTER — Ambulatory Visit (HOSPITAL_COMMUNITY)
Admission: RE | Admit: 2011-02-17 | Discharge: 2011-02-17 | Disposition: A | Discharge status: Home / Self Care | Payer: Self-pay | Source: Ambulatory Visit | Attending: Urology | Admitting: Urology

## 2011-02-17 DIAGNOSIS — N289 Disorder of kidney and ureter, unspecified: Secondary | ICD-10-CM | POA: Insufficient documentation

## 2011-02-17 DIAGNOSIS — N2889 Other specified disorders of kidney and ureter: Secondary | ICD-10-CM

## 2011-02-17 LAB — CBC
MCH: 33.9 pg (ref 26.0–34.0)
MCHC: 35 g/dL (ref 30.0–36.0)
MCV: 97.1 fL (ref 78.0–100.0)
Platelets: 229 10*3/uL (ref 150–400)
RBC: 4.42 MIL/uL (ref 3.87–5.11)

## 2011-02-17 LAB — APTT: aPTT: 29 s (ref 24–37)

## 2011-02-23 ENCOUNTER — Ambulatory Visit (HOSPITAL_COMMUNITY)
Admission: RE | Admit: 2011-02-23 | Discharge: 2011-02-23 | Disposition: A | Discharge status: Home / Self Care | Payer: Self-pay | Source: Ambulatory Visit | Attending: Urology | Admitting: Urology

## 2011-02-23 ENCOUNTER — Encounter (HOSPITAL_COMMUNITY): Payer: Self-pay

## 2011-02-23 ENCOUNTER — Other Ambulatory Visit: Payer: Self-pay | Admitting: Urology

## 2011-02-23 ENCOUNTER — Ambulatory Visit (HOSPITAL_COMMUNITY): Payer: Self-pay

## 2011-02-23 DIAGNOSIS — D175 Benign lipomatous neoplasm of intra-abdominal organs: Secondary | ICD-10-CM | POA: Insufficient documentation

## 2011-02-23 DIAGNOSIS — Z85118 Personal history of other malignant neoplasm of bronchus and lung: Secondary | ICD-10-CM | POA: Insufficient documentation

## 2011-02-23 LAB — CBC
HCT: 44.2 % (ref 36.0–46.0)
MCV: 98 fL (ref 78.0–100.0)
RBC: 4.51 MIL/uL (ref 3.87–5.11)
RDW: 12.2 % (ref 11.5–15.5)
WBC: 8.1 10*3/uL (ref 4.0–10.5)

## 2011-02-23 LAB — APTT: aPTT: 28 seconds (ref 24–37)

## 2011-03-01 IMAGING — CR DG CHEST 2V
2 series · 2 of 2 positions shown · non-contrast
Comparison: Chest x-ray of 04/06/2010

CLINICAL DATA: Chest pain, history of lung carcinoma and prior
surgery, some shortness of breath

CHEST - 2 VIEW

[w chest pa]
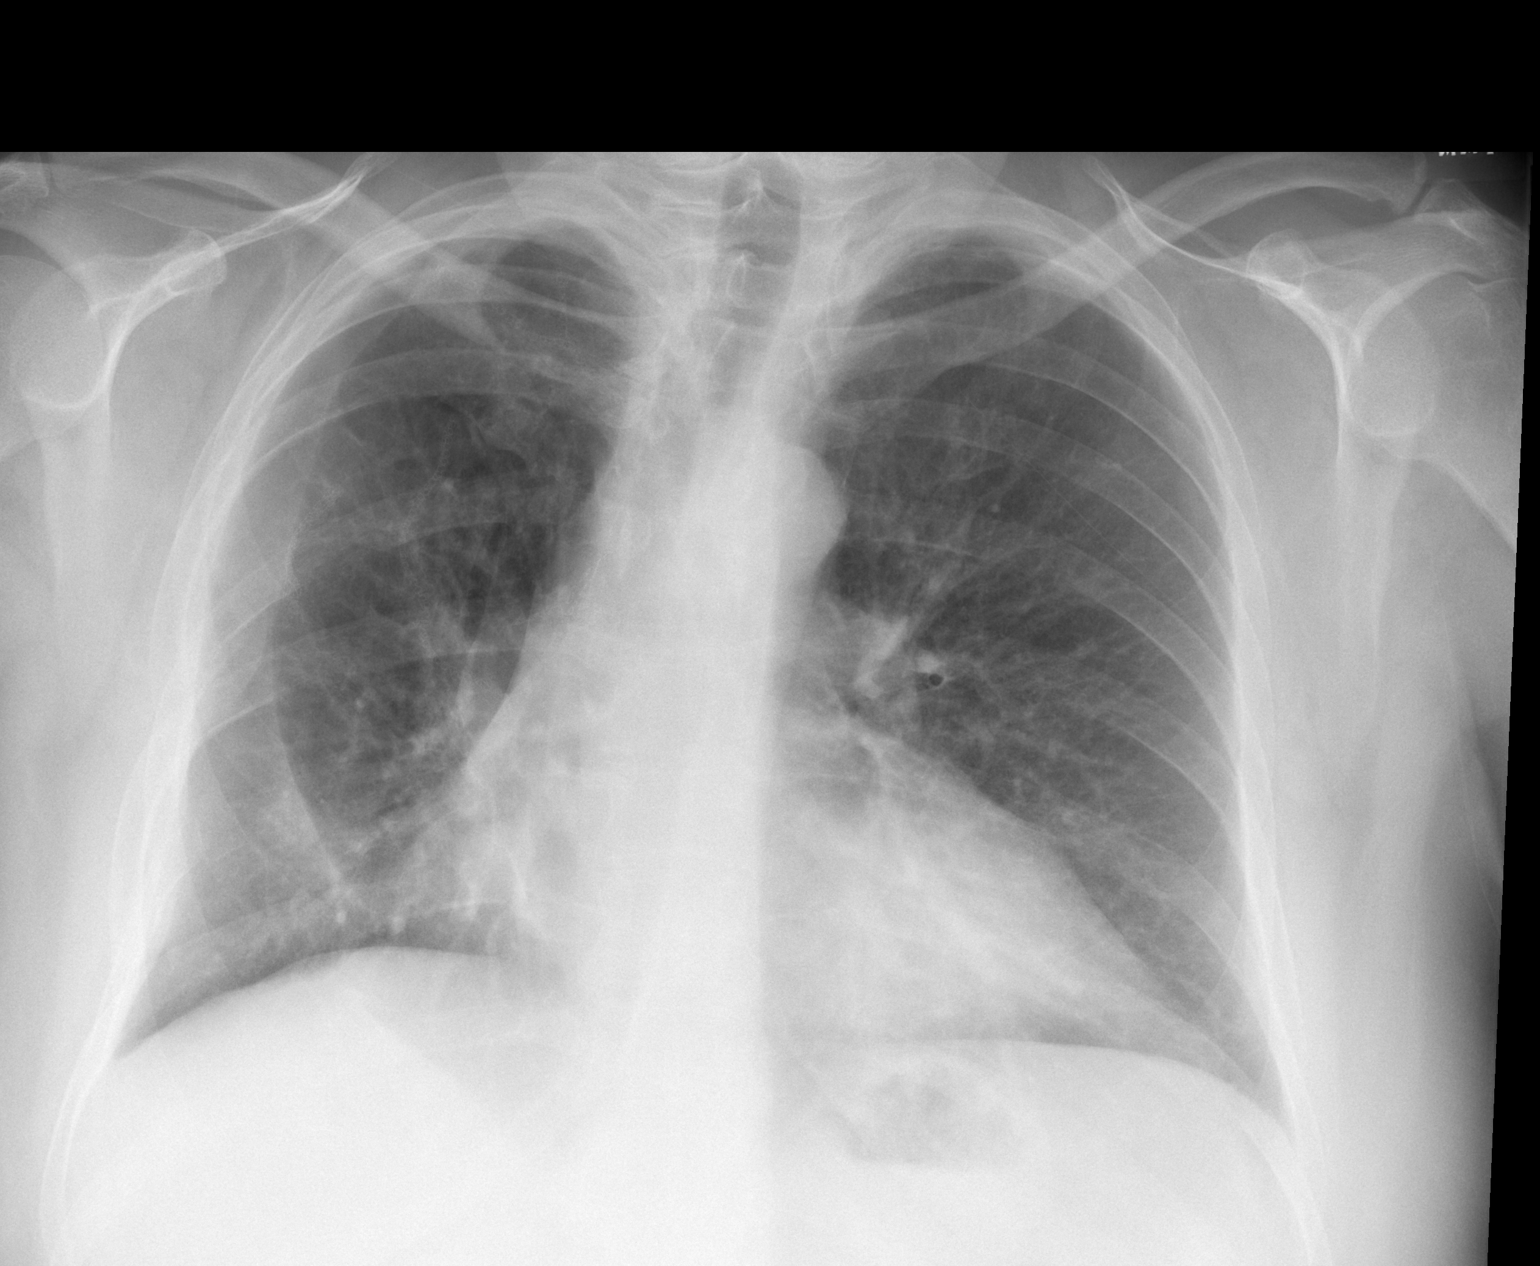

[w chest lat]
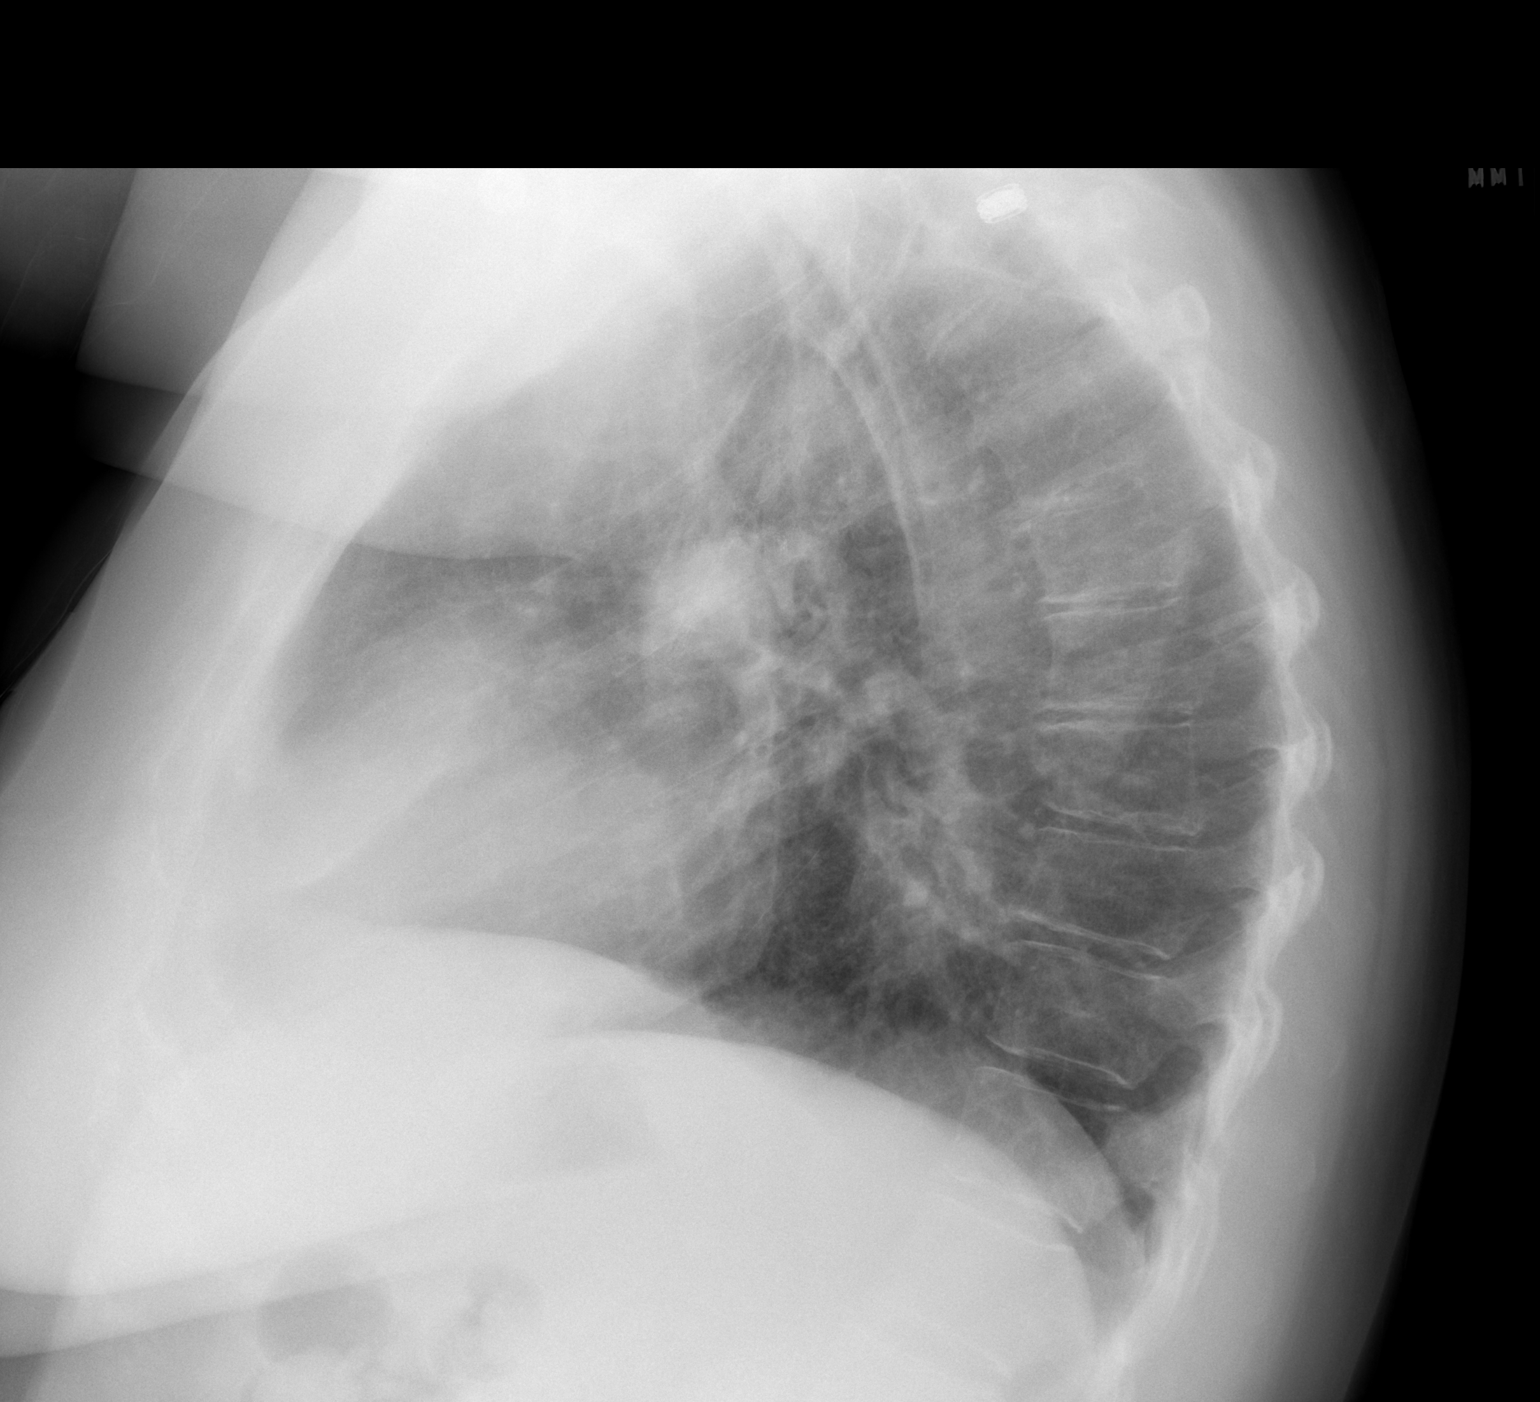

[2 of 2 positions shown; findings below may reference images not displayed]

FINDINGS: Vague opacity in the periphery of the right hemithorax is
stable and most consistent with pleural thickening.  Peribronchial
thickening is stable.  No active infiltrate or effusion is seen.
Cardiomegaly is stable.  No acute bony abnormality is seen.
IMPRESSION: Stable chest x-ray with probable pleural thickening peripherally in
the right hemithorax.  No active process.

## 2011-04-21 ENCOUNTER — Other Ambulatory Visit (HOSPITAL_BASED_OUTPATIENT_CLINIC_OR_DEPARTMENT_OTHER): Payer: Self-pay | Admitting: Lab

## 2011-04-21 ENCOUNTER — Ambulatory Visit (HOSPITAL_COMMUNITY)
Admission: RE | Admit: 2011-04-21 | Discharge: 2011-04-21 | Disposition: A | Discharge status: Home / Self Care | Payer: Self-pay | Source: Ambulatory Visit | Attending: Internal Medicine | Admitting: Internal Medicine

## 2011-04-21 ENCOUNTER — Encounter (HOSPITAL_COMMUNITY): Payer: Self-pay

## 2011-04-21 ENCOUNTER — Other Ambulatory Visit: Payer: Self-pay | Admitting: Internal Medicine

## 2011-04-21 ENCOUNTER — Inpatient Hospital Stay (HOSPITAL_COMMUNITY): Admission: RE | Admit: 2011-04-21 | Payer: Self-pay | Source: Ambulatory Visit

## 2011-04-21 DIAGNOSIS — D175 Benign lipomatous neoplasm of intra-abdominal organs: Secondary | ICD-10-CM | POA: Insufficient documentation

## 2011-04-21 DIAGNOSIS — C3490 Malignant neoplasm of unspecified part of unspecified bronchus or lung: Secondary | ICD-10-CM | POA: Insufficient documentation

## 2011-04-21 DIAGNOSIS — Z9221 Personal history of antineoplastic chemotherapy: Secondary | ICD-10-CM | POA: Insufficient documentation

## 2011-04-21 DIAGNOSIS — N2889 Other specified disorders of kidney and ureter: Secondary | ICD-10-CM

## 2011-04-21 DIAGNOSIS — C3410 Malignant neoplasm of upper lobe, unspecified bronchus or lung: Secondary | ICD-10-CM

## 2011-04-21 LAB — CBC WITH DIFFERENTIAL/PLATELET
BASO%: 0.2 % (ref 0.0–2.0)
EOS%: 1.2 % (ref 0.0–7.0)
HCT: 46 % (ref 34.8–46.6)
MCH: 32.6 pg (ref 25.1–34.0)
MCHC: 33.6 g/dL (ref 31.5–36.0)
MONO#: 0.3 10*3/uL (ref 0.1–0.9)
NEUT%: 76.1 % (ref 38.4–76.8)
RBC: 4.74 10*6/uL (ref 3.70–5.45)
RDW: 12.4 % (ref 11.2–14.5)
WBC: 7.2 10*3/uL (ref 3.9–10.3)
lymph#: 1.3 10*3/uL (ref 0.9–3.3)

## 2011-04-21 LAB — CMP (CANCER CENTER ONLY)
ALT(SGPT): 21 U/L (ref 10–47)
AST: 19 U/L (ref 11–38)
CO2: 31 mEq/L (ref 18–33)
Calcium: 9.1 mg/dL (ref 8.0–10.3)
Chloride: 103 mEq/L (ref 98–108)
Creat: 0.6 mg/dl (ref 0.6–1.2)
Potassium: 4.6 mEq/L (ref 3.3–4.7)
Sodium: 146 mEq/L — ABNORMAL HIGH (ref 128–145)
Total Protein: 7.5 g/dL (ref 6.4–8.1)

## 2011-04-21 MED ORDER — IOHEXOL 300 MG/ML  SOLN
100.0000 mL | Freq: Once | INTRAMUSCULAR | Status: AC | PRN
  Administered 2011-04-21: 100 mL via INTRAVENOUS

## 2011-04-24 ENCOUNTER — Ambulatory Visit (HOSPITAL_BASED_OUTPATIENT_CLINIC_OR_DEPARTMENT_OTHER): Payer: Self-pay | Admitting: Internal Medicine

## 2011-04-24 VITALS — BP 93/67 | HR 94 | Temp 98.7°F | Ht 66.5 in | Wt 155.4 lb

## 2011-04-24 DIAGNOSIS — C3490 Malignant neoplasm of unspecified part of unspecified bronchus or lung: Secondary | ICD-10-CM

## 2011-04-24 DIAGNOSIS — C3410 Malignant neoplasm of upper lobe, unspecified bronchus or lung: Secondary | ICD-10-CM

## 2011-04-24 NOTE — Progress Notes (Signed)
[facility] OFFICE PROGRESS NOTE  DIAGNOSIS: Stage IIA (T2b N0 M0) non-small cell lung cancer consistent with adenocarcinoma with negative EGFR mutation and negative ALK gene translocation diagnosed in September 2010.  PRIOR THERAPY: :   1. Status post right upper lobectomy with lymph node dissection on March 18, 2010, under the care of Dr. Edwyna Shell. 2. Status post 4 cycles of adjuvant chemotherapy with carboplatin and Alimta.  Last dose was given July 13, 2010.  CURRENT THERAPY: Observation..  INTERVAL HISTORY: [name] 55 y.o. female returns to the clinic today for followup visit accompanied by family member. The patient is feeling fine today she denied having any specific complaint, specifically no chest pain or shortness breath. She has no cough or hemoptysis no nausea or vomiting, no weight loss or night sweats. 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 The patient has repeat CT scan of the chest, abdomen and pelvis performed recently and she is here today for evaluation and discussion of her scan results.  MEDICAL HISTORY: Past Medical History  Diagnosis Date  . Diabetes mellitus   . Lung cancer     lung ca dx 11/11    ALLERGIES:  is allergic to oxycodone; penicillins; and sulfa antibiotics.  MEDICATIONS:  Current Outpatient Prescriptions  Medication Sig Dispense Refill  . diazepam (VALIUM) 10 MG tablet Take 10 mg by mouth every 6 (six) hours as needed.        . naproxen sodium (ANAPROX) 220 MG tablet Take 220 mg by mouth as needed.         REVIEW OF SYSTEMS:  A comprehensive review of systems was negative.   PHYSICAL EXAMINATION: General appearance: alert, cooperative and no distress Head: Normocephalic, without obvious abnormality, atraumatic Neck: no adenopathy Lymph nodes: Cervical, supraclavicular, and axillary nodes normal. Resp: clear  to auscultation bilaterally Cardio: regular rate and rhythm, S1, S2 normal, no murmur, click, rub or gallop GI: soft, non-tender; bowel sounds normal; no masses,  no organomegaly Extremities: extremities normal, atraumatic, no cyanosis or edema Neurologic: Alert and oriented X 3, normal strength and tone. Normal symmetric reflexes. Normal coordination and gait  ECOG PERFORMANCE STATUS: 0 - Asymptomatic  Blood pressure 93/67, pulse 94, temperature 98.7 F (37.1 C), temperature source Oral, height 5' 6.5" (1.689 m), weight 155 lb 6.4 oz (70.489 kg).  LABORATORY DATA: Lab Results  Component Value Date   WBC 7.2 04/21/2011   HGB 15.5 04/21/2011   HCT 46.0 04/21/2011   MCV 97.1 04/21/2011   PLT 200 04/21/2011      Chemistry      Component Value Date/Time   NA 146* 04/21/2011 1141   NA 139 07/13/2010 1156   NA 139 07/13/2010 1156   K 4.6 04/21/2011 1141   K 4.1 07/13/2010 1156   K 4.1 07/13/2010 1156   CL 103 04/21/2011 1141   CL 107 07/13/2010 1156   CL 107 07/13/2010 1156   CO2 31 04/21/2011 1141   CO2 25 07/13/2010 1156   CO2 25 07/13/2010 1156   BUN 12 04/21/2011 1141   BUN 12 07/13/2010 1156   BUN 12 07/13/2010 1156   CREATININE 0.6 04/21/2011 1141   CREATININE 0.65 07/13/2010 1156   CREATININE 0.65 07/13/2010 1156      Component Value Date/Time   CALCIUM 9.1 04/21/2011 1141   CALCIUM 8.4 07/13/2010 1156   CALCIUM 8.4 07/13/2010 1156  ALKPHOS 153* 04/21/2011 1141   ALKPHOS 123* 07/13/2010 1156   ALKPHOS 123* 07/13/2010 1156   AST 19 04/21/2011 1141   AST 15 07/13/2010 1156   AST 15 07/13/2010 1156   ALT 16 07/13/2010 1156   ALT 16 07/13/2010 1156   BILITOT 0.40 04/21/2011 1141   BILITOT 0.2* 07/13/2010 1156   BILITOT 0.2* 07/13/2010 1156       RADIOGRAPHIC STUDIES: Ct Chest W Contrast  04/21/2011  *RADIOLOGY REPORT*  Clinical Data:  Lung cancer diagnosed 1 year ago with chemotherapy completed 01/12.  Surgery for benign renal tumor.  CT CHEST, AND ABDOMEN WITH CONTRAST  Technique:  Contiguous axial images of the chest and abdomen were obtained after IV contrast administration.  Contrast: 100  ml Omnipaque-300  Comparison: Chest CT of 01/24/2011.  PET of 02/18/2010.  No prior abdominal CTs.  CT CHEST  Findings: Lung windows demonstrate secretions or aspiration in the right lower lobe endobronchial tree.  Example image 32.  This is new since the prior exam.  Status post right upper lobectomy. 3 mm lingular nodule which is unchanged on image 38. Subpleural left lower lobe 5 mm nodule on image 41 which is unchanged and favored to represent a subpleural lymph node. Patchy interstitial opacity within the right lower lobe is similar. Volume loss in the left lower lobe.  Soft tissue windows demonstrate heterogeneous right lobe of the thyroid, without well-defined mass.  Heart size upper normal, without pericardial or pleural effusion. No central pulmonary embolism, on this non-dedicated study.  Stable small middle mediastinal lymph nodes.  None meet size criteria for pathologic enlargement.  Index subcarinal node measures 6 mm and is unchanged. No hilar adenopathy.  High right paratracheal/para esophageal nodes.  6 and 7 mm on image 10.  Comparison 5 and 8 mm on the prior exam, suggesting stability.  Mild residual thymic tissue in the anterior mediastinum.  IMPRESSION:  1.  Status post right upper lobectomy.  No evidence of recurrent or metastatic disease. 2.  Similar small mediastinal nodes, none pathologic by size criteria. 3.  Patchy right base air space disease is similar back to 08/30/2010 and favored to be post infectious or inflammatory. 4.  Stable left sided lung nodules, likely subpleural lymph nodes. 5.  New secretions or aspiration in the right lower lobe endobronchial tree. Recommend attention on follow-up.    CT ABDOMEN Findings:  Normal liver, spleen, stomach, pancreas, gallbladder, biliary tract, adrenal glands, right kidney.  Exophytic left renal mass again identified and measures  3.0 x 1.4 cm on image 53.   Too small to characterize interpolar left renal lesion on image 14. Right renal sinus cysts.  No retroperitoneal or retrocrural adenopathy. Normal abdominal bowel loops.  No ascites.  Healing right 11th posterior rib fracture is unchanged since the prior.  There is also an eighth rib healing fracture or surgical defect.  IMPRESSION:  1. No acute process or evidence of metastatic disease in the abdomen or pelvis. 2.  Similar size of a exophytic left renal mass.  This was biopsied on 02/23/2011 and represented an angiomyolipoma  Original Report Authenticated By: Consuello Bossier, M.D.   ASSESSMENT: This is a very pleasant 55 years old white female with stage II a non-small cell lung cancer status post right upper lobectomy followed by 4 cycles of adjuvant chemotherapy was carboplatin and Alimta. The patient is doing fine and she has no evidence for disease progression on his recent scans. I discussed the scan results with the  patient.  PLAN: I recommended for her continuous observation for now. I would see her back for followup visit in 4 months with repeat CT scan of the chest abdomen and pelvis.  All questions were answered. The patient knows to call the clinic with any problems, questions or concerns. We can certainly see the patient much sooner if necessary.

## 2011-09-01 ENCOUNTER — Other Ambulatory Visit (HOSPITAL_BASED_OUTPATIENT_CLINIC_OR_DEPARTMENT_OTHER): Payer: Self-pay | Admitting: Lab

## 2011-09-01 ENCOUNTER — Ambulatory Visit (HOSPITAL_COMMUNITY)
Admission: RE | Admit: 2011-09-01 | Discharge: 2011-09-01 | Disposition: A | Discharge status: Home / Self Care | Payer: Self-pay | Source: Ambulatory Visit | Attending: Internal Medicine | Admitting: Internal Medicine

## 2011-09-01 DIAGNOSIS — D175 Benign lipomatous neoplasm of intra-abdominal organs: Secondary | ICD-10-CM | POA: Insufficient documentation

## 2011-09-01 DIAGNOSIS — C3490 Malignant neoplasm of unspecified part of unspecified bronchus or lung: Secondary | ICD-10-CM

## 2011-09-01 DIAGNOSIS — R059 Cough, unspecified: Secondary | ICD-10-CM | POA: Insufficient documentation

## 2011-09-01 DIAGNOSIS — R05 Cough: Secondary | ICD-10-CM | POA: Insufficient documentation

## 2011-09-01 DIAGNOSIS — J90 Pleural effusion, not elsewhere classified: Secondary | ICD-10-CM | POA: Insufficient documentation

## 2011-09-01 LAB — CBC WITH DIFFERENTIAL/PLATELET
BASO%: 0.6 % (ref 0.0–2.0)
EOS%: 3.2 % (ref 0.0–7.0)
HCT: 37.8 % (ref 34.8–46.6)
LYMPH%: 20.9 % (ref 14.0–49.7)
MCH: 34.1 pg — ABNORMAL HIGH (ref 25.1–34.0)
MCHC: 34.7 g/dL (ref 31.5–36.0)
NEUT%: 67.9 % (ref 38.4–76.8)
RBC: 3.85 10*6/uL (ref 3.70–5.45)
WBC: 4.4 10*3/uL (ref 3.9–10.3)
lymph#: 0.9 10*3/uL (ref 0.9–3.3)
nRBC: 0 % (ref 0–0)

## 2011-09-01 LAB — CMP (CANCER CENTER ONLY)
ALT(SGPT): 23 U/L (ref 10–47)
AST: 19 U/L (ref 11–38)
Alkaline Phosphatase: 99 U/L — ABNORMAL HIGH (ref 26–84)
BUN, Bld: 14 mg/dL (ref 7–22)
Creat: 0.6 mg/dl (ref 0.6–1.2)

## 2011-09-01 MED ORDER — IOHEXOL 300 MG/ML  SOLN
100.0000 mL | Freq: Once | INTRAMUSCULAR | Status: AC | PRN
  Administered 2011-09-01: 100 mL via INTRAVENOUS

## 2011-09-04 ENCOUNTER — Telehealth: Payer: Self-pay | Admitting: Internal Medicine

## 2011-09-04 ENCOUNTER — Ambulatory Visit (HOSPITAL_BASED_OUTPATIENT_CLINIC_OR_DEPARTMENT_OTHER): Payer: Self-pay | Admitting: Internal Medicine

## 2011-09-04 VITALS — BP 127/75 | HR 97 | Temp 97.0°F | Ht 66.5 in | Wt 166.3 lb

## 2011-09-04 DIAGNOSIS — C3490 Malignant neoplasm of unspecified part of unspecified bronchus or lung: Secondary | ICD-10-CM

## 2011-09-04 DIAGNOSIS — C3410 Malignant neoplasm of upper lobe, unspecified bronchus or lung: Secondary | ICD-10-CM
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
## 2011-09-04 NOTE — Telephone Encounter (Signed)
Gv pt appt for oct2013.  scheduled pt for ct scan on 10/21 @ [facility]

## 2012-03-04 ENCOUNTER — Other Ambulatory Visit: Payer: Self-pay | Admitting: Lab

## 2012-03-04 ENCOUNTER — Ambulatory Visit (HOSPITAL_COMMUNITY)
Admission: RE | Admit: 2012-03-04 | Discharge: 2012-03-04 | Payer: Self-pay | Source: Ambulatory Visit | Attending: Internal Medicine | Admitting: Internal Medicine

## 2012-03-05 NOTE — Progress Notes (Signed)
Patient 'no-showed' for CT scan on 03/04/12; patient to have CT scan done prior to next appointment (10/13 @ 11:15), per Dr. Arbutus Ped; attempted to contact patient @ all available numbers listed in chart [phone]/ [phone]/ [phone]) with no results; also attempted to contact patient @ [phone] and was told 'do not call this number anymore.'

## 2012-03-06 ENCOUNTER — Ambulatory Visit: Payer: Self-pay | Admitting: Internal Medicine

## 2012-03-08 ENCOUNTER — Other Ambulatory Visit: Payer: Self-pay | Admitting: *Deleted

## 2012-03-08 ENCOUNTER — Telehealth: Payer: Self-pay | Admitting: Internal Medicine

## 2012-03-08 NOTE — Telephone Encounter (Signed)
see

## 2012-03-08 NOTE — Telephone Encounter (Signed)
see 10/22 message.  I called the home number and it just rang,cell # is someone named Elmarie Shiley and the Wrk# is a [employer].  Will send the pt a letter to call    anne

## 2012-06-25 ENCOUNTER — Encounter: Payer: Self-pay | Admitting: Internal Medicine

## 2012-06-25 NOTE — Progress Notes (Signed)
Attempted to call pt back at all 3 numbers on file.  There is no answering service to leave a message.

## 2013-06-18 ENCOUNTER — Telehealth: Payer: Self-pay | Admitting: Medical Oncology

## 2013-06-18 ENCOUNTER — Telehealth: Payer: Self-pay | Admitting: Internal Medicine

## 2013-06-18 NOTE — Telephone Encounter (Signed)
s/w desk nurse re call from sister wanting to get pt back on schedule w/MM. pt was [facility] Oct 2014 for lb/ct/fu. per desk nurse ok to r/s for lb/ct/fu. desk nurse made aware newe ct order needed and when order is entered I can schedule appt. per desk nurse she enter ct order. also s/w pt sister Lenna Sciara @ [phone]. she is aware I will call her back w/appts once ct order has been entered

## 2013-06-18 NOTE — Telephone Encounter (Signed)
pts sister called to request appt for [facility]. Needs new order for CT

## 2013-06-19 ENCOUNTER — Other Ambulatory Visit: Payer: Self-pay | Admitting: Medical Oncology

## 2013-06-19 DIAGNOSIS — C3490 Malignant neoplasm of unspecified part of unspecified bronchus or lung: Secondary | ICD-10-CM

## 2013-06-20 ENCOUNTER — Telehealth: Payer: Self-pay | Admitting: Internal Medicine

## 2013-06-20 NOTE — Telephone Encounter (Signed)
called both #'s no vm on either...mailed pt appt sched and letter

## 2013-06-25 ENCOUNTER — Telehealth: Payer: Self-pay | Admitting: Internal Medicine

## 2013-06-25 NOTE — Telephone Encounter (Signed)
s/w pt's sister Lenna Sciara to confirm appts and per [facility] appts will need to be r/s as hallee is in rehab and they are not wanting to take her out for any appts prior to the 1st wk in march. all exisiting appts cx'd and lb/ct r/s for 3/2 and f/u r/s for 3/9. sister has new d/t's and will be given any other appts that need to be scheduled after pt sees MM. desk nurse informed

## 2013-06-26 ENCOUNTER — Ambulatory Visit (HOSPITAL_COMMUNITY): Payer: Self-pay

## 2013-06-26 ENCOUNTER — Other Ambulatory Visit: Payer: Self-pay

## 2013-06-30 ENCOUNTER — Ambulatory Visit: Payer: Self-pay | Admitting: Internal Medicine

## 2013-07-10 ENCOUNTER — Other Ambulatory Visit: Payer: Self-pay

## 2013-07-11 ENCOUNTER — Telehealth: Payer: Self-pay | Admitting: Medical Oncology

## 2013-07-14 ENCOUNTER — Other Ambulatory Visit: Payer: Self-pay

## 2013-07-14 ENCOUNTER — Ambulatory Visit (HOSPITAL_COMMUNITY): Payer: Self-pay

## 2013-07-14 NOTE — Telephone Encounter (Signed)
07/14/13 Pt did not show up for appt today.

## 2013-07-21 ENCOUNTER — Other Ambulatory Visit: Payer: Self-pay | Admitting: *Deleted

## 2013-07-21 ENCOUNTER — Ambulatory Visit: Payer: Self-pay | Admitting: Internal Medicine

## 2013-07-22 ENCOUNTER — Telehealth: Payer: Self-pay | Admitting: Internal Medicine

## 2013-07-22 ENCOUNTER — Other Ambulatory Visit: Payer: Self-pay | Admitting: *Deleted

## 2013-07-22 DIAGNOSIS — C3490 Malignant neoplasm of unspecified part of unspecified bronchus or lung: Secondary | ICD-10-CM

## 2013-07-22 NOTE — Telephone Encounter (Signed)
S/w the pt's sister and she stated that they are not ready to reschedule the cancelled appts due to the pt is still in a rehab center. Per pt's sister they will call us back when they are ready.

## 2013-07-22 NOTE — Telephone Encounter (Signed)
lmonvm advising the pt of her lab and md appt. Lm advising the pt that she will receive a call from the rad dept regarding her scan appt with the instructions.

## 2013-07-24 ENCOUNTER — Other Ambulatory Visit: Payer: Self-pay

## 2013-08-07 ENCOUNTER — Other Ambulatory Visit: Payer: Self-pay

## 2013-08-11 ENCOUNTER — Other Ambulatory Visit: Payer: Self-pay

## 2013-08-11 ENCOUNTER — Ambulatory Visit (HOSPITAL_COMMUNITY): Payer: Self-pay

## 2013-08-13 ENCOUNTER — Ambulatory Visit: Payer: Self-pay | Admitting: Internal Medicine

## 2013-09-01 ENCOUNTER — Other Ambulatory Visit: Payer: Self-pay | Admitting: *Deleted

## 2013-09-01 NOTE — Progress Notes (Signed)
Pt's daughter Lenna Sciara called stating that pt is now having lymph node swelling in her neck and chest and she needs to have her CT scan done ASAP.  She needs to have a scan done of her head and neck along with CAP.  Per dr Vista Mink, she needs to be evaluated first with CT CAP, then he will order further testing if indicated.  Called and left msg with Melissa at [phone].  Called and left msg with Allena Earing as well SLJ

## 2013-09-02 ENCOUNTER — Telehealth: Payer: Self-pay | Admitting: Internal Medicine

## 2013-09-02 NOTE — Telephone Encounter (Signed)
lvm for pt regarding to April and May appt.Marland KitchenMarland KitchenMarland Kitchen

## 2013-09-04 ENCOUNTER — Telehealth: Payer: Self-pay | Admitting: *Deleted

## 2013-09-04 NOTE — Telephone Encounter (Signed)
Pt's daughter Lenna Sciara called wanting to make sure dr Vista Mink knew that pt is enrolled in a drug addition program that will last a year.  Informed Dr Vista Mink.  SLJ

## 2013-09-05 ENCOUNTER — Other Ambulatory Visit (HOSPITAL_BASED_OUTPATIENT_CLINIC_OR_DEPARTMENT_OTHER): Payer: No Typology Code available for payment source

## 2013-09-05 ENCOUNTER — Ambulatory Visit
Admission: RE | Admit: 2013-09-05 | Discharge: 2013-09-05 | Disposition: A | Discharge status: Home / Self Care | Payer: No Typology Code available for payment source | Source: Ambulatory Visit | Attending: Internal Medicine | Admitting: Internal Medicine

## 2013-09-05 DIAGNOSIS — C3410 Malignant neoplasm of upper lobe, unspecified bronchus or lung: Secondary | ICD-10-CM

## 2013-09-05 DIAGNOSIS — C3490 Malignant neoplasm of unspecified part of unspecified bronchus or lung: Secondary | ICD-10-CM

## 2013-09-05 LAB — COMPREHENSIVE METABOLIC PANEL (CC13)
ALT: 17 U/L (ref 0–55)
ANION GAP: 11 meq/L (ref 3–11)
AST: 15 U/L (ref 5–34)
Albumin: 4.1 g/dL (ref 3.5–5.0)
Alkaline Phosphatase: 105 U/L (ref 40–150)
BUN: 10.3 mg/dL (ref 7.0–26.0)
CALCIUM: 10.3 mg/dL (ref 8.4–10.4)
CHLORIDE: 105 meq/L (ref 98–109)
CO2: 25 meq/L (ref 22–29)
Creatinine: 0.7 mg/dL (ref 0.6–1.1)
Glucose: 103 mg/dl (ref 70–140)
Potassium: 3.9 mEq/L (ref 3.5–5.1)
SODIUM: 142 meq/L (ref 136–145)
TOTAL PROTEIN: 7.3 g/dL (ref 6.4–8.3)
Total Bilirubin: 0.34 mg/dL (ref 0.20–1.20)

## 2013-09-05 LAB — CBC WITH DIFFERENTIAL/PLATELET
BASO%: 0.2 % (ref 0.0–2.0)
Basophils Absolute: 0 10*3/uL (ref 0.0–0.1)
EOS ABS: 0 10*3/uL (ref 0.0–0.5)
EOS%: 0.7 % (ref 0.0–7.0)
HCT: 43.1 % (ref 34.8–46.6)
HGB: 15.1 g/dL (ref 11.6–15.9)
LYMPH#: 1.8 10*3/uL (ref 0.9–3.3)
LYMPH%: 31.4 % (ref 14.0–49.7)
MCH: 31.4 pg (ref 25.1–34.0)
MCHC: 35 g/dL (ref 31.5–36.0)
MCV: 89.6 fL (ref 79.5–101.0)
MONO#: 0.4 10*3/uL (ref 0.1–0.9)
MONO%: 7.3 % (ref 0.0–14.0)
NEUT%: 60.4 % (ref 38.4–76.8)
NEUTROS ABS: 3.4 10*3/uL (ref 1.5–6.5)
NRBC: 0 % (ref 0–0)
Platelets: 197 10*3/uL (ref 145–400)
RBC: 4.81 10*6/uL (ref 3.70–5.45)
RDW: 12.2 % (ref 11.2–14.5)
WBC: 5.6 10*3/uL (ref 3.9–10.3)

## 2013-09-05 MED ORDER — IOHEXOL 300 MG/ML  SOLN
125.0000 mL | Freq: Once | INTRAMUSCULAR | Status: AC | PRN
  Administered 2013-09-05: 125 mL via INTRAVENOUS

## 2013-09-05 MED ORDER — IOHEXOL 300 MG/ML  SOLN
40.0000 mL | Freq: Once | INTRAMUSCULAR | Status: AC | PRN
  Administered 2013-09-05: 40 mL via ORAL

## 2013-09-08 ENCOUNTER — Other Ambulatory Visit: Payer: Self-pay

## 2013-09-23 ENCOUNTER — Ambulatory Visit (HOSPITAL_BASED_OUTPATIENT_CLINIC_OR_DEPARTMENT_OTHER): Payer: Self-pay | Admitting: Internal Medicine

## 2013-09-23 ENCOUNTER — Encounter: Payer: Self-pay | Admitting: Internal Medicine

## 2013-09-23 ENCOUNTER — Telehealth: Payer: Self-pay | Admitting: Internal Medicine

## 2013-09-23 VITALS — BP 125/80 | HR 100 | Temp 97.9°F | Resp 19 | Ht 66.5 in | Wt 202.8 lb

## 2013-09-23 DIAGNOSIS — C3490 Malignant neoplasm of unspecified part of unspecified bronchus or lung: Secondary | ICD-10-CM

## 2013-09-23 DIAGNOSIS — J312 Chronic pharyngitis: Secondary | ICD-10-CM

## 2013-09-23 DIAGNOSIS — C3410 Malignant neoplasm of upper lobe, unspecified bronchus or lung: Secondary | ICD-10-CM

## 2013-09-23 NOTE — Telephone Encounter (Signed)
gv adn prnted appt sched and avs for pt f or May 2016.Marland KitchenMarland KitchenMarland KitchenPt aware of Dr. Natale Milch appt on 5.20.15 @ 10:30

## 2013-09-23 NOTE — Progress Notes (Signed)
[facility] Telephone:([phone]   Fax:(336) [phone]  OFFICE PROGRESS NOTE  DIAGNOSIS: Stage IIA (T2b N0 M0) non-small cell lung cancer consistent with adenocarcinoma with negative EGFR mutation and negative ALK gene translocation diagnosed in September 2010.   PRIOR THERAPY: :  1. Status post right upper lobectomy with lymph node dissection on March 18, 2010, under the care of Dr. Arlyce Dice. 2. Status post 4 cycles of adjuvant chemotherapy with carboplatin and Alimta. Last dose was given July 13, 2010.  CURRENT THERAPY: Observation..  INTERVAL HISTORY: [name] 58 y.o. female returns to the clinic today for four-month followup visit accompanied by her sister. She was last seen 2 years ago and missed many of her followup visit. The patient is doing fine today with no specific complaints except for: sore throat and feeling of fullness in her neck. She is currently a resident of discharge rehabilitation facility for recovery from drug abuse. She is feeling much better these days and she quit smoking 3 months ago. She denied having any other significant complaints. She has no chest pain, shortness breath, cough or hemoptysis. She has no nausea or vomiting. The patient has repeat CT scan of the chest, abdomen and pelvis performed recently and she is here today for evaluation and discussion of her scan results.  MEDICAL HISTORY: Past Medical History  Diagnosis Date  . Diabetes mellitus   . Lung cancer     lung ca dx 11/11    ALLERGIES:  is allergic to valium; oxycodone; penicillins; and sulfa antibiotics.  MEDICATIONS:  Current Outpatient Prescriptions  Medication Sig Dispense Refill  . cholecalciferol (VITAMIN D) 1000 UNITS tablet Take 1,000 Units by mouth daily.      . Multiple Vitamin (MULTIVITAMIN) tablet Take 1 tablet by mouth daily.       No current facility-administered medications for this visit.    REVIEW OF SYSTEMS:  A comprehensive review of  systems was negative except for: Ears, nose, mouth, throat, and face: positive for sore throat   PHYSICAL EXAMINATION: General appearance: alert, cooperative and no distress Neck: no adenopathy Resp: clear to auscultation bilaterally Cardio: regular rate and rhythm, S1, S2 normal, no murmur, click, rub or gallop GI: soft, non-tender; bowel sounds normal; no masses,  no organomegaly Extremities: extremities normal, atraumatic, no cyanosis or edema  ECOG PERFORMANCE STATUS: 1 - Symptomatic but completely ambulatory  Blood pressure 125/80, pulse 100, temperature 97.9 F (36.6 C), temperature source Oral, resp. rate 19, height 5' 6.5" (1.689 m), weight 202 lb 12.8 oz (91.989 kg).  LABORATORY DATA: Lab Results  Component Value Date   WBC 5.6 09/05/2013   HGB 15.1 09/05/2013   HCT 43.1 09/05/2013   MCV 89.6 09/05/2013   PLT 197 09/05/2013      Chemistry      Component Value Date/Time   NA 142 09/05/2013 1214   NA 141 09/01/2011 0909   NA 139 07/13/2010 1156   K 3.9 09/05/2013 1214   K 4.2 09/01/2011 0909   K 4.1 07/13/2010 1156   CL 101 09/01/2011 0909   CL 107 07/13/2010 1156   CO2 25 09/05/2013 1214   CO2 26 09/01/2011 0909   CO2 25 07/13/2010 1156   BUN 10.3 09/05/2013 1214   BUN 14 09/01/2011 0909   BUN 12 07/13/2010 1156   CREATININE 0.7 09/05/2013 1214   CREATININE 0.6 09/01/2011 0909   CREATININE 0.65 07/13/2010 1156      Component Value Date/Time  CALCIUM 10.3 09/05/2013 1214   CALCIUM 8.5 09/01/2011 0909   CALCIUM 8.4 07/13/2010 1156   ALKPHOS 105 09/05/2013 1214   ALKPHOS 99* 09/01/2011 0909   ALKPHOS 123* 07/13/2010 1156   AST 15 09/05/2013 1214   AST 19 09/01/2011 0909   AST 15 07/13/2010 1156   ALT 17 09/05/2013 1214   ALT 23 09/01/2011 0909   ALT 16 07/13/2010 1156   BILITOT 0.34 09/05/2013 1214   BILITOT 0.50 09/01/2011 0909   BILITOT 0.2* 07/13/2010 1156       RADIOGRAPHIC STUDIES: Ct Chest W Contrast  09/05/2013   CLINICAL DATA:  Right lung cancer with resection and  chemotherapy. Subsequent treatment strategy.  EXAM: CT CHEST, ABDOMEN, AND PELVIS WITH CONTRAST  TECHNIQUE: Multidetector CT imaging of the chest, abdomen and pelvis was performed following the standard protocol during bolus administration of intravenous contrast.  CONTRAST:  26mL OMNIPAQUE IOHEXOL 300 MG/ML SOLN, 167mL OMNIPAQUE IOHEXOL 300 MG/ML SOLN  COMPARISON:  CT 09/01/2011  FINDINGS:   CT CHEST FINDINGS  No axillary or supraclavicular lymphadenopathy. No mediastinal or hilar lymphadenopathy. No pericardial fluid. Esophagus is normal. No central pulmonary embolism.  Review of the lung parenchyma demonstrates postsurgical change in the right upper lobe consistent with wedge resection. No nodularity is present.    CT ABDOMEN AND PELVIS FINDINGS  No focal hepatic lesion. The gallbladder, pancreas, spleen, adrenal glands, and kidneys are normal.  The stomach, small bowel, appendix, and cecum normal. The colon and rectosigmoid colon are normal.  Abdominal aorta is normal caliber. No retroperitoneal periportal lymphadenopathy.  No free fluid the pelvis. The uterus and ovaries are normal. The bladder is normal. No pelvic lymphadenopathy. No aggressive osseous lesion.    IMPRESSION: 1. Stable postsurgical change in the right upper lobe. 2. No evidence of local lung cancer recurrence or metastasis in the chest, abdomen, or pelvis.   Electronically Signed   By: Suzy Bouchard M.D.   On: 09/05/2013 14:32   ASSESSMENT AND PLAN: This is a very pleasant 58 years old white female with history of stage IIA non-small cell lung cancer status post resection followed by adjuvant chemotherapy. The patient is doing fine and she has no evidence for disease recurrence.  Her recent CT scan of the chest, abdomen and pelvis showed no evidence for disease recurrence or metastases. Her oral exam showed no evidence for inflammatory process in the throat and no palpable lymphadenopathy in the neck. The patient has chronic sore  throat and she is interested in seeing ENT for evaluation. I will refer her to Dr. Radene Journey for evaluation. I discussed the scan results with the patient and her sister. I recommended for her continuous observation for now with repeat CT scan of the chest in 1 year. She was advised to call me immediately if she has any concerning symptoms in the interval.  All questions were answered. The patient knows to call the clinic with any problems, questions or concerns. We can certainly see the patient much sooner if necessary.  Disclaimer: This note was dictated with voice recognition software. Similar sounding words can inadvertently be transcribed and may not be corrected upon review.

## 2014-09-21 ENCOUNTER — Ambulatory Visit (HOSPITAL_COMMUNITY): Admission: RE | Admit: 2014-09-21 | Payer: Self-pay | Source: Ambulatory Visit

## 2014-09-21 ENCOUNTER — Other Ambulatory Visit: Payer: Self-pay

## 2014-09-22 ENCOUNTER — Telehealth: Payer: Self-pay | Admitting: *Deleted

## 2014-09-22 NOTE — Telephone Encounter (Signed)
Fax received from Radiology, pt no show CT scan 5/9. No phone # listed for pt, attempted to reach pt sister [name], informed sister pt missed lab and CT scan 5/9 will need to r/s these appts. Appt with MD will be scheduled after CT scan appt. Unable to reach pt as there is no phone # available to call in chart. Sister advised pt has new phone numbers Home# [phone] mobile# [phone] Attempted to reach pt at home# no answer, unable to leave message. Rings only. Call to mobile # unable to leave message, no voice mail is set up.  POF to scheduling to r/s Labs, CT and MD appt

## 2014-09-23 ENCOUNTER — Other Ambulatory Visit: Payer: Self-pay | Admitting: Internal Medicine

## 2014-09-24 ENCOUNTER — Telehealth: Payer: Self-pay | Admitting: Internal Medicine

## 2014-09-24 NOTE — Telephone Encounter (Signed)
Confirm appointment for June 1 & 8.

## 2014-09-28 ENCOUNTER — Ambulatory Visit: Payer: Self-pay | Admitting: Internal Medicine

## 2014-10-13 ENCOUNTER — Encounter (HOSPITAL_BASED_OUTPATIENT_CLINIC_OR_DEPARTMENT_OTHER): Payer: Self-pay | Admitting: *Deleted

## 2014-10-13 ENCOUNTER — Other Ambulatory Visit (HOSPITAL_COMMUNITY): Payer: Self-pay | Admitting: Otolaryngology

## 2014-10-13 NOTE — H&P (Signed)
PREOPERATIVE H&P  Chief Complaint: left sided sore throat  HPI: [name] is a 59 y.o. female who presents for evaluation of chronic left sided sore throat she's had for several months despite antibiotic treatment. She does have moderate size tonsils but no acute exudate. 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 She's taken to the OR for tonsillectomy and amputation of the left styloid process.  Past Medical History  Diagnosis Date  . Drug abuse     history of, went to rehab 2015  . Diabetes mellitus without complication   . GERD (gastroesophageal reflux disease)   . Lung cancer     lung ca dx 11/11- right upper lobe   Past Surgical History  Procedure Laterality Date  . Tubal ligation    . Lung lobectomy Right 2011    RUL removed for lung cancer  . Ankle fracture surgery Left    History   Social History  . Marital Status: Single    Spouse Name: N/A  . Number of Children: N/A  . Years of Education: N/A   Social History Main Topics  . Smoking status: Former Smoker    Quit date: 05/15/2013  . Smokeless tobacco: Not on file  . Alcohol Use: No  . Drug Use: No  . Sexual Activity: Not on file   Other Topics Concern  . None   Social History Narrative   Family History  Problem Relation Age of Onset  . Family history unknown: Yes   Allergies  Allergen Reactions  . Oxycodone     Previous addiction  . Penicillins   . Sulfa Antibiotics    Prior to Admission medications   Not on File     Positive ROS: left sided sore throat  All other systems have been reviewed and were otherwise negative with the exception of those mentioned in the HPI and as above.  Physical Exam: There were no vitals filed for this visit.  General: Alert, no acute distress Oral: Normal oral mucosa and tonsils 2+, IDL epiglottis and BOT clear Nasal: Clear nasal passages Neck: No palpable adenopathy or thyroid nodules Ear:  Ear canal is clear with normal appearing TMs Cardiovascular: Regular rate and rhythm, no murmur.  Respiratory: Clear to auscultation Neurologic: Alert and oriented x 3   Assessment/Plan: TONSILLITIS AND EAGLE SYNDROME Plan for Procedure(s): TONSILLECTOMY EXCSION LEFT STYLOID PROCESS   Melony Overly, MD 10/13/2014 4:15 PM

## 2014-10-14 ENCOUNTER — Other Ambulatory Visit: Payer: Self-pay

## 2014-10-14 ENCOUNTER — Ambulatory Visit (HOSPITAL_COMMUNITY): Payer: Self-pay

## 2014-10-16 ENCOUNTER — Ambulatory Visit (HOSPITAL_BASED_OUTPATIENT_CLINIC_OR_DEPARTMENT_OTHER)
Admission: RE | Admit: 2014-10-16 | Discharge: 2014-10-17 | Disposition: A | Discharge status: Home / Self Care | Payer: 59 | Source: Ambulatory Visit | Attending: Otolaryngology | Admitting: Otolaryngology

## 2014-10-16 ENCOUNTER — Ambulatory Visit (HOSPITAL_BASED_OUTPATIENT_CLINIC_OR_DEPARTMENT_OTHER): Payer: 59 | Admitting: Anesthesiology

## 2014-10-16 ENCOUNTER — Encounter (HOSPITAL_BASED_OUTPATIENT_CLINIC_OR_DEPARTMENT_OTHER): Payer: Self-pay | Admitting: *Deleted

## 2014-10-16 ENCOUNTER — Encounter (HOSPITAL_BASED_OUTPATIENT_CLINIC_OR_DEPARTMENT_OTHER)
Admission: RE | Disposition: A | Discharge status: Home / Self Care | Payer: Self-pay | Source: Ambulatory Visit | Attending: Otolaryngology

## 2014-10-16 DIAGNOSIS — J3501 Chronic tonsillitis: Secondary | ICD-10-CM | POA: Diagnosis not present

## 2014-10-16 DIAGNOSIS — Z87891 Personal history of nicotine dependence: Secondary | ICD-10-CM | POA: Insufficient documentation

## 2014-10-16 DIAGNOSIS — J039 Acute tonsillitis, unspecified: Secondary | ICD-10-CM | POA: Insufficient documentation

## 2014-10-16 HISTORY — DX: Other psychoactive substance abuse, uncomplicated: F19.10

## 2014-10-16 HISTORY — DX: Gastro-esophageal reflux disease without esophagitis: K21.9

## 2014-10-16 HISTORY — PX: STYLOID PROCESS EXCISION: SHX5198

## 2014-10-16 HISTORY — PX: TONSILLECTOMY: SHX5217

## 2014-10-16 LAB — POCT HEMOGLOBIN-HEMACUE: HEMOGLOBIN: 13.4 g/dL (ref 12.0–15.0)

## 2014-10-16 SURGERY — TONSILLECTOMY
Anesthesia: General | Site: Mouth | Laterality: Left

## 2014-10-16 MED ORDER — PROPOFOL 10 MG/ML IV BOLUS
INTRAVENOUS | Status: DC | PRN
Start: 2014-10-16 — End: 2014-10-16
  Administered 2014-10-16: 200 mg via INTRAVENOUS

## 2014-10-16 MED ORDER — HYDROMORPHONE HCL 1 MG/ML IJ SOLN
0.2500 mg | INTRAMUSCULAR | Status: DC | PRN
Start: 2014-10-16 — End: 2014-10-16
  Administered 2014-10-16 (×4): 0.5 mg via INTRAVENOUS
  Filled 2014-10-16: qty 1

## 2014-10-16 MED ORDER — ONDANSETRON HCL 4 MG/2ML IJ SOLN
INTRAMUSCULAR | Status: DC | PRN
  Administered 2014-10-16 (×2): 4 mg via INTRAVENOUS

## 2014-10-16 MED ORDER — PROMETHAZINE HCL 25 MG/ML IJ SOLN
6.2500 mg | INTRAMUSCULAR | Status: DC | PRN
  Administered 2014-10-16: 6.25 mg via INTRAVENOUS

## 2014-10-16 MED ORDER — LIDOCAINE-EPINEPHRINE 1 %-1:100000 IJ SOLN
INTRAMUSCULAR | Status: AC
  Filled 2014-10-16: qty 3

## 2014-10-16 MED ORDER — MORPHINE SULFATE 2 MG/ML IJ SOLN
2.0000 mg | INTRAMUSCULAR | Status: DC | PRN
  Administered 2014-10-16: 2 mg via INTRAVENOUS
  Administered 2014-10-17: 4 mg via INTRAVENOUS
  Filled 2014-10-16: qty 2
  Filled 2014-10-16: qty 1

## 2014-10-16 MED ORDER — HYDROCODONE-ACETAMINOPHEN 7.5-325 MG PO TABS: 1.0000 | ORAL_TABLET | Freq: Once | ORAL | Status: DC | PRN

## 2014-10-16 MED ORDER — SODIUM CHLORIDE 0.9 % IJ SOLN
INTRAMUSCULAR | Status: AC
  Filled 2014-10-16: qty 10

## 2014-10-16 MED ORDER — AZITHROMYCIN 200 MG/5ML PO SUSR: 250.0000 mg | Freq: Every day | ORAL | Status: DC

## 2014-10-16 MED ORDER — HYDROCODONE-ACETAMINOPHEN 7.5-325 MG/15ML PO SOLN
10.0000 mL | ORAL | Status: DC | PRN
  Administered 2014-10-16 – 2014-10-17 (×5): 15 mL via ORAL
  Filled 2014-10-16 (×4): qty 15

## 2014-10-16 MED ORDER — HYDROMORPHONE HCL 1 MG/ML IJ SOLN
INTRAMUSCULAR | Status: AC
  Filled 2014-10-16: qty 1

## 2014-10-16 MED ORDER — SILVER NITRATE-POT NITRATE 75-25 % EX MISC
CUTANEOUS | Status: AC
  Filled 2014-10-16: qty 1

## 2014-10-16 MED ORDER — SUFENTANIL CITRATE 50 MCG/ML IV SOLN
INTRAVENOUS | Status: DC | PRN
  Administered 2014-10-16: 10 ug via INTRAVENOUS
  Administered 2014-10-16 (×3): 5 ug via INTRAVENOUS

## 2014-10-16 MED ORDER — PHENOL 1.4 % MT LIQD
1.0000 | OROMUCOSAL | Status: DC | PRN
  Administered 2014-10-16 (×2): 1 via OROMUCOSAL
  Filled 2014-10-16: qty 177

## 2014-10-16 MED ORDER — BACITRACIN ZINC 500 UNIT/GM EX OINT
TOPICAL_OINTMENT | CUTANEOUS | Status: AC
  Filled 2014-10-16: qty 56.7

## 2014-10-16 MED ORDER — EPINEPHRINE HCL 1 MG/ML IJ SOLN
INTRAMUSCULAR | Status: AC
  Filled 2014-10-16: qty 1

## 2014-10-16 MED ORDER — BACITRACIN ZINC 500 UNIT/GM EX OINT
TOPICAL_OINTMENT | CUTANEOUS | Status: AC
  Filled 2014-10-16: qty 28.35

## 2014-10-16 MED ORDER — SUCCINYLCHOLINE CHLORIDE 20 MG/ML IJ SOLN
INTRAMUSCULAR | Status: DC | PRN
  Administered 2014-10-16: 100 mg via INTRAVENOUS

## 2014-10-16 MED ORDER — EPHEDRINE SULFATE 50 MG/ML IJ SOLN: INTRAMUSCULAR | Status: DC | PRN

## 2014-10-16 MED ORDER — CEFAZOLIN SODIUM 1-5 GM-% IV SOLN
INTRAVENOUS | Status: AC
  Filled 2014-10-16: qty 150

## 2014-10-16 MED ORDER — EPINEPHRINE HCL 1 MG/ML IJ SOLN
INTRAMUSCULAR | Status: DC | PRN
  Administered 2014-10-16: 1 mg

## 2014-10-16 MED ORDER — CEFAZOLIN SODIUM-DEXTROSE 2-3 GM-% IV SOLR
INTRAVENOUS | Status: AC
  Filled 2014-10-16: qty 50

## 2014-10-16 MED ORDER — CEFAZOLIN SODIUM-DEXTROSE 2-3 GM-% IV SOLR
2.0000 g | INTRAVENOUS | Status: AC
  Administered 2014-10-16: 2 g via INTRAVENOUS

## 2014-10-16 MED ORDER — LIDOCAINE HCL (CARDIAC) 20 MG/ML IV SOLN
INTRAVENOUS | Status: DC | PRN
  Administered 2014-10-16: 50 mg via INTRAVENOUS

## 2014-10-16 MED ORDER — BACITRACIN ZINC 500 UNIT/GM EX OINT: 1.0000 "application " | TOPICAL_OINTMENT | Freq: Three times a day (TID) | CUTANEOUS | Status: DC

## 2014-10-16 MED ORDER — SUFENTANIL CITRATE 50 MCG/ML IV SOLN
INTRAVENOUS | Status: AC
  Filled 2014-10-16: qty 1

## 2014-10-16 MED ORDER — SODIUM CHLORIDE 0.9 % IJ SOLN
INTRAMUSCULAR | Status: DC | PRN
  Administered 2014-10-16: 10 mL via INTRAVENOUS

## 2014-10-16 MED ORDER — GLYCOPYRROLATE 0.2 MG/ML IJ SOLN
0.2000 mg | Freq: Once | INTRAMUSCULAR | Status: DC | PRN
Start: 2014-10-16 — End: 2014-10-16

## 2014-10-16 MED ORDER — HYDROCODONE-ACETAMINOPHEN 7.5-325 MG/15ML PO SOLN: 10.0000 mL | Freq: Four times a day (QID) | ORAL | Status: DC | PRN

## 2014-10-16 MED ORDER — MIDAZOLAM HCL 2 MG/2ML IJ SOLN
INTRAMUSCULAR | Status: AC
  Filled 2014-10-16: qty 2

## 2014-10-16 MED ORDER — FENTANYL CITRATE (PF) 100 MCG/2ML IJ SOLN: 50.0000 ug | INTRAMUSCULAR | Status: DC | PRN

## 2014-10-16 MED ORDER — POTASSIUM CHLORIDE IN NACL 20-0.9 MEQ/L-% IV SOLN
INTRAVENOUS | Status: DC
  Administered 2014-10-16: 11:00:00 via INTRAVENOUS
  Filled 2014-10-16: qty 1000

## 2014-10-16 MED ORDER — PROMETHAZINE HCL 25 MG/ML IJ SOLN
INTRAMUSCULAR | Status: AC
  Filled 2014-10-16: qty 1

## 2014-10-16 MED ORDER — DEXAMETHASONE SODIUM PHOSPHATE 4 MG/ML IJ SOLN
INTRAMUSCULAR | Status: DC | PRN
  Administered 2014-10-16: 10 mg via INTRAVENOUS

## 2014-10-16 MED ORDER — CEFAZOLIN SODIUM 1-5 GM-% IV SOLN
1.0000 g | Freq: Three times a day (TID) | INTRAVENOUS | Status: DC
  Administered 2014-10-16 – 2014-10-17 (×2): 1 g via INTRAVENOUS
  Filled 2014-10-16 (×2): qty 50

## 2014-10-16 MED ORDER — MIDAZOLAM HCL 2 MG/2ML IJ SOLN
1.0000 mg | INTRAMUSCULAR | Status: DC | PRN
  Administered 2014-10-16: 2 mg via INTRAVENOUS

## 2014-10-16 MED ORDER — LACTATED RINGERS IV SOLN
INTRAVENOUS | Status: DC
  Administered 2014-10-16 (×2): via INTRAVENOUS

## 2014-10-16 SURGICAL SUPPLY — 37 items
BANDAGE COBAN STERILE 2 (GAUZE/BANDAGES/DRESSINGS) IMPLANT
CANISTER SUCT 1200ML W/VALVE (MISCELLANEOUS) ×4 IMPLANT
CATH ROBINSON RED A/P 12FR (CATHETERS) IMPLANT
COAGULATOR SUCT 6 FR SWTCH (ELECTROSURGICAL)
COAGULATOR SUCT SWTCH 10FR 6 (ELECTROSURGICAL) IMPLANT
COVER MAYO STAND STRL (DRAPES) ×4 IMPLANT
ELECT COATED BLADE 2.86 ST (ELECTRODE) ×4 IMPLANT
ELECT REM PT RETURN 9FT ADLT (ELECTROSURGICAL) ×4
ELECT REM PT RETURN 9FT PED (ELECTROSURGICAL)
ELECTRODE REM PT RETRN 9FT PED (ELECTROSURGICAL) IMPLANT
ELECTRODE REM PT RTRN 9FT ADLT (ELECTROSURGICAL) IMPLANT
GLOVE BIOGEL PI IND STRL 7.0 (GLOVE) IMPLANT
GLOVE BIOGEL PI INDICATOR 7.0 (GLOVE) ×2
GLOVE ECLIPSE 6.5 STRL STRAW (GLOVE) ×2 IMPLANT
GLOVE SS BIOGEL STRL SZ 7.5 (GLOVE) ×2 IMPLANT
GLOVE SUPERSENSE BIOGEL SZ 7.5 (GLOVE) ×2
GOWN STRL REUS W/ TWL LRG LVL3 (GOWN DISPOSABLE) ×2 IMPLANT
GOWN STRL REUS W/TWL LRG LVL3 (GOWN DISPOSABLE) ×4
MARKER SKIN DUAL TIP RULER LAB (MISCELLANEOUS) IMPLANT
NDL HYPO 25X1 1.5 SAFETY (NEEDLE) IMPLANT
NDL SPNL 25GX3.5 QUINCKE BL (NEEDLE) IMPLANT
NEEDLE HYPO 25X1 1.5 SAFETY (NEEDLE) IMPLANT
NEEDLE SPNL 25GX3.5 QUINCKE BL (NEEDLE) IMPLANT
NS IRRIG 1000ML POUR BTL (IV SOLUTION) ×4 IMPLANT
PENCIL FOOT CONTROL (ELECTRODE) ×4 IMPLANT
SHEET MEDIUM DRAPE 40X70 STRL (DRAPES) ×4 IMPLANT
SLEEVE SCD COMPRESS KNEE MED (MISCELLANEOUS) ×4 IMPLANT
SOLUTION BUTLER CLEAR DIP (MISCELLANEOUS) IMPLANT
SPONGE GAUZE 4X4 12PLY STER LF (GAUZE/BANDAGES/DRESSINGS) ×4 IMPLANT
SPONGE TONSIL 1 RF SGL (DISPOSABLE) IMPLANT
SPONGE TONSIL 1.25 RF SGL STRG (GAUZE/BANDAGES/DRESSINGS) IMPLANT
SUT CHROMIC 3 0 SH 27 (SUTURE) ×2 IMPLANT
SYR BULB 3OZ (MISCELLANEOUS) ×4 IMPLANT
SYR CONTROL 10ML LL (SYRINGE) IMPLANT
TOWEL OR 17X24 6PK STRL BLUE (TOWEL DISPOSABLE) ×4 IMPLANT
TUBE CONNECTING 20'X1/4 (TUBING) ×2
TUBE CONNECTING 20X1/4 (TUBING) ×4 IMPLANT

## 2014-10-16 NOTE — Discharge Instructions (Signed)
Instructions for Home Care After Tonsillectomy  First Day Home: Encourage fluid intake by frequently offering liquids, soup, ice cream jello, etc.  Drink several glasses of water.  Cooler fluids are best.  Avoid hot and highly seasoned foods.  Orange juice, grapefruit juice and tomato juice may cause stinging sensation because of their acidic content.    Second and Third Day Home: Continue liquids and add soft foods, (pudding, macaroni and cheese, mashed potatoes, soft scrambled eggs, etc.).  Make sure you drink plenty of liquids so you do not get dehydrated.  Fifth Thru Seventh Day Home: Gradually resume a normal diet, but avoid hot foods, potato chips, nuts, toast and crackers until 2 weeks after surgery.  General Instructions   No undue physical exertion or exercise for one week.  Children: Tylenol may be used for discomfort and/or fever.  Use as often as necessary within limits of the directions.  Adults: May spray throat with Chloroseptic or other topical anesthetic for discomfort and use pain medication obtained by prescription as directed.    A slight fever (up to 101) is expected for the first the first couple of days.  Take Tylenol (or aspirin substitute) as directed.  Pain in ears is common after tonsillectomy.  It represents pain referred from the throat where the tonsils were removed.  There is usually nothing wrong with the ears in most cases.  Administer Tylenol as needed to control this pain.  White patches will form where the tonsils were removed.  This is perfectly normal.  They will disappear in one to two weeks.  Mouth odor may be notice during the healing stages.  Do not use aspirin for two weeks; it increases the possibility of bleeding.  In a very small percentage of people, there is some bleeding after five to six days.  If this happens, do not become excited, for the bleeding is usually light.  Be quiet, lie down, and spit the blood out gently.  Gargle the throat  with ice water.  If the bleeding does not stop promptly, call the office [phone]), which answers 24 hours a day.  A follow up appointment should be made with [name] 10-14 days following surgery. Please call [phone] for the appointment time.  Take your regular meds Tylenol, motrin or Hydrocodone Elixir 10-15 cc every 6 hrs prn pain Zithromax 6 cc daily for 5 days

## 2014-10-16 NOTE — Anesthesia Preprocedure Evaluation (Addendum)
Anesthesia Evaluation  Patient identified by MRN, date of birth, ID band Patient awake    Reviewed: Allergy & Precautions, NPO status , Patient's Chart, lab work & pertinent test results  Airway Mallampati: II  TM Distance: >3 FB Neck ROM: Full    Dental   Pulmonary former smoker,  Lung CA s/p right sided lobectomy breath sounds clear to auscultation        Cardiovascular negative cardio ROS  Rhythm:Regular Rate:Normal     Neuro/Psych negative neurological ROS     GI/Hepatic Neg liver ROS, GERD-  ,  Endo/Other  diabetesMorbid obesity  Renal/GU negative Renal ROS     Musculoskeletal   Abdominal   Peds  Hematology negative hematology ROS (+)   Anesthesia Other Findings   Reproductive/Obstetrics                           Anesthesia Physical Anesthesia Plan  ASA: II  Anesthesia Plan: General   Post-op Pain Management:    Induction: Intravenous  Airway Management Planned: Nasal ETT and Oral ETT  Additional Equipment:   Intra-op Plan:   Post-operative Plan: Extubation in OR  Informed Consent: I have reviewed the patients History and Physical, chart, labs and discussed the procedure including the risks, benefits and alternatives for the proposed anesthesia with the patient or authorized representative who has indicated his/her understanding and acceptance.   Dental advisory given  Plan Discussed with: CRNA  Anesthesia Plan Comments:         Anesthesia Quick Evaluation

## 2014-10-16 NOTE — Brief Op Note (Signed)
DX  Chronic tonsillitis, Eagle's Syndrome OP   Tonsillectomy,  Amputation of distal left styloid process SURG  Radene Journey Anesth  GEN EBL  40 cc Comp  None

## 2014-10-16 NOTE — Progress Notes (Signed)
Post Op check No bleeding or airway problems OK pos Plan to discharge in am

## 2014-10-16 NOTE — Interval H&P Note (Signed)
Dicussed tonsillectomy with patient.

## 2014-10-16 NOTE — Interval H&P Note (Signed)
The patient has been re-examined, and the chart reviewed, and there have been no interval changes to the documented history and physical.    The risks, benefits, and alternatives have been discussed at length, and the patient is willing to proceed.   

## 2014-10-16 NOTE — Anesthesia Postprocedure Evaluation (Signed)
  Anesthesia Post-op Note  Patient: [name]  Procedure(s) Performed: Procedure(s): TONSILLECTOMY  (Left) EXCISION LEFT STYLOID PROCESS (Left)  Patient Location: PACU  Anesthesia Type:General  Level of Consciousness: awake and alert   Airway and Oxygen Therapy: Patient Spontanous Breathing  Post-op Pain: mild  Post-op Assessment: Post-op Vital signs reviewed  Post-op Vital Signs: Reviewed  Last Vitals:  Filed Vitals:   10/16/14 1000  BP: 146/87  Pulse: 90  Temp: 36.3 C  Resp: 18    Complications: No apparent anesthesia complications

## 2014-10-16 NOTE — Transfer of Care (Signed)
Immediate Anesthesia Transfer of Care Note  Patient: [name]  Procedure(s) Performed: Procedure(s): TONSILLECTOMY  (Left) EXCISION LEFT STYLOID PROCESS (Left)  Patient Location: PACU  Anesthesia Type:General  Level of Consciousness: awake, alert  and oriented  Airway & Oxygen Therapy: Patient Spontanous Breathing and Patient connected to face mask oxygen  Post-op Assessment: Report given to RN and Post -op Vital signs reviewed and stable  Post vital signs: Reviewed and stable  Last Vitals:  Filed Vitals:   10/16/14 0639  BP: 111/59  Pulse: 74  Temp: 36.7 C  Resp: 20    Complications: No apparent anesthesia complications

## 2014-10-16 NOTE — Anesthesia Procedure Notes (Signed)
Procedure Name: Intubation Date/Time: 10/16/2014 7:39 AM Performed by: Melynda Ripple D Pre-anesthesia Checklist: Patient identified, Emergency Drugs available, Suction available and Patient being monitored Patient Re-evaluated:Patient Re-evaluated prior to inductionOxygen Delivery Method: Circle System Utilized Preoxygenation: Pre-oxygenation with 100% oxygen Intubation Type: IV induction Ventilation: Mask ventilation without difficulty Laryngoscope Size: Mac and 3 Grade View: Grade I Tube type: Oral Number of attempts: 1 Airway Equipment and Method: Stylet and Oral airway Placement Confirmation: ETT inserted through vocal cords under direct vision,  positive ETCO2 and breath sounds checked- equal and bilateral Secured at: 23 cm Tube secured with: Tape Dental Injury: Teeth and Oropharynx as per pre-operative assessment

## 2014-10-17 DIAGNOSIS — J039 Acute tonsillitis, unspecified: Secondary | ICD-10-CM | POA: Diagnosis not present

## 2014-10-17 MED ORDER — PROMETHAZINE HCL 25 MG/ML IJ SOLN
INTRAMUSCULAR | Status: AC
  Filled 2014-10-17: qty 1

## 2014-10-17 MED ORDER — PROMETHAZINE HCL 25 MG/ML IJ SOLN
12.5000 mg | Freq: Four times a day (QID) | INTRAMUSCULAR | Status: DC | PRN
  Administered 2014-10-17: 12.5 mg via INTRAVENOUS

## 2014-10-17 NOTE — Progress Notes (Signed)
POD 1 AF VSS Doing well with minimal discomfort. No bleeding. Good po intake. Discharge home. F/U 10-14 days. Meds: Zithromax and hydrocodone elixir  Discharge dictated #[id number]

## 2014-10-19 ENCOUNTER — Encounter (HOSPITAL_BASED_OUTPATIENT_CLINIC_OR_DEPARTMENT_OTHER): Payer: Self-pay | Admitting: Otolaryngology

## 2014-10-19 NOTE — Discharge Summary (Signed)
NAMEAVALENE, SEALY                ACCOUNT NO.:  [id number]  MEDICAL RECORD NO.:  [id number]  LOCATION:                               FACILITY:  [facility]  PHYSICIAN:  Leonides Sake. Lucia Gaskins, M.D.DATE OF BIRTH:  Apr 28, 1956  DATE OF ADMISSION:  10/16/2014 DATE OF DISCHARGE:  10/17/2014                              DISCHARGE SUMMARY   PREOPERATIVE DIAGNOSES: 1. Chronic tonsillitis. 2. Eagle syndrome, left.  POSTOPERATIVE DIAGNOSES: 1. Chronic tonsillitis. 2. Eagle Syndrome, left.  OPERATION PERFORMED:  Tonsillectomy with amputation of the distal left styloid process.  HOSPITAL COURSE:  The patient was admitted for 24-hour observation at [facility] following tonsillectomy and amputation of left styloid process because of chronic left-sided throat pain and discomfort, it felt to be related to possibly the Eagle syndrome.  She had some bleeding at the time of amputation of the styloid process and was admitted for 24-hour observation to check for any further bleeding.  She tolerated the procedure well.  She was taking p.o.'s adequately.  She had no substantial bleeding during 24-hour observation and is discharged home on the following morning.  DISCHARGE MEDICATIONS:  Include Zithromax and hydrocodone elixir along with her regular medications.  I will have her followup in my office in 10-14 days for recheck.          ______________________________ Leonides Sake. Lucia Gaskins, M.D.     CEN/MEDQ  D:  10/17/2014  T:  10/17/2014  Job:  [id number]

## 2014-10-19 NOTE — Op Note (Signed)
NAMESABRINE, PATCHEN                ACCOUNT NO.:  [id number]  MEDICAL RECORD NO.:  [id number]  LOCATION:                                FACILITY:  [facility]  PHYSICIAN:  Leonides Sake. Lucia Gaskins, M.D.DATE OF BIRTH:  Dec 26, 1955  DATE OF PROCEDURE:  10/16/2014 DATE OF DISCHARGE:  10/16/2014                              OPERATIVE REPORT   PREOPERATIVE DIAGNOSES: 1. Chronic tonsillitis. 2. Questionable Eagle's syndrome.  POSTOPERATIVE DIAGNOSES: 1. Chronic tonsillitis. 2. Questionable Eagle's syndrome.  OPERATION PERFORMED:  Tonsillectomy with amputation of distal left styloid process.  SURGEON:  Leonides Sake. Lucia Gaskins, M.D.  ANESTHESIA:  General endotracheal.  COMPLICATIONS:  None.  BRIEF CLINICAL NOTE:  The patient is a 59 year old female, who has had chronic left-sided throat pain and discomfort.  She has had a CT scan of her neck which showed an elongated styloid process bilaterally with the left styloid process a little bit longer measuring 3.3 cm in length, and radiologist raised the possibility of Eagle's syndrome on the left side. On review of the CT scan, she did have an elongated gated styloid process but fairly deep to the tonsil which was also enlarged on the left side.  Her symptoms are all left-sided.  She was taken to the operating room at this time for a tonsillectomy and possible excision of the distal left styloid process.  DESCRIPTION OF PROCEDURE:  After adequate endotracheal anesthesia, the patient received 10 mg of Decadron and 2 g of Ancef IV preoperatively. A mouth gag was used to expose the oropharynx.  The patient had a moderate size cryptic tonsils bilaterally with the left tonsil larger than the right tonsil.  First, tonsillectomy was performed.  Hemostasis was obtained with the tonsils were removed from tonsillar fossa using a cautery.  Care was taken to preserve the uvula and posterior tonsillar pillars.  Hemostasis was obtained with a cautery.  Tonsils  were sent to Pathology as separate specimens, right and left.  Following tonsillectomy, the tonsil fossas were palpated bilaterally, really could not palpate the styloid process on the right side; but on the left side, I could palpate the distal end of the styloid process on just the left side only.  The distal 6-8 mm in the styloid process were then exposed through the tonsil fossa with blunt dissection.  After exposing the distal 6 mm of the styloid process, the Kerrison was used to cut the distal tip where 5-6 mm of the styloid process was exposed.  The Kerrison cut the styloid process, and the distal end was being dissected out of some loose attachments; and on dissecting the distal tip out to remove the distal tip, some bleeding was encountered, and the distal tip was not entirely removed.  This was cut but left in place.  A packing was placed for hemostasis.  Some topical adrenaline, diluted saline was also placed for hemostasis.  This substantially slowed down the bleeding, and suction cautery was used to control the bleeding.  After controlling the bleeding with suction cautery, a single 2-0 chromic suture was placed with a figure-of-eight over the bleeding site.  There was no other significant bleeding; and because of risk of further bleeding,  the distal tip was left in place and not entirely removed. However, it was amputated.  Oropharynx was irrigated with saline.  This completed the procedure.  The patient was awoken from anesthesia and transferred to recovery room, postop doing well.  DISPOSITION:  We will plan on observation overnight and on Zithromax and discharge home on Zithromax 200 mg daily for 5 days along with Tylenol, Motrin, or hydrocodone p.r.n. pain.  She will follow up in my office in 10 days for recheck.          ______________________________ Leonides Sake. Lucia Gaskins, M.D.     CEN/MEDQ  D:  10/16/2014  T:  10/16/2014  Job:  [id number]  cc:   Eilleen Kempf, M.D.

## 2014-10-21 ENCOUNTER — Ambulatory Visit: Payer: Self-pay | Admitting: Internal Medicine

## 2014-10-21 ENCOUNTER — Emergency Department
Admission: EM | Admit: 2014-10-21 | Discharge: 2014-10-21 | Disposition: A | Discharge status: Home / Self Care | Payer: 59 | Attending: Emergency Medicine | Admitting: Emergency Medicine

## 2014-10-21 ENCOUNTER — Encounter: Payer: Self-pay | Admitting: Medical Oncology

## 2014-10-21 ENCOUNTER — Telehealth: Payer: Self-pay | Admitting: Medical Oncology

## 2014-10-21 DIAGNOSIS — Z9089 Acquired absence of other organs: Secondary | ICD-10-CM | POA: Diagnosis not present

## 2014-10-21 DIAGNOSIS — G8918 Other acute postprocedural pain: Secondary | ICD-10-CM | POA: Diagnosis not present

## 2014-10-21 DIAGNOSIS — R070 Pain in throat: Secondary | ICD-10-CM | POA: Insufficient documentation

## 2014-10-21 DIAGNOSIS — Z880 Allergy status to penicillin: Secondary | ICD-10-CM | POA: Diagnosis not present

## 2014-10-21 DIAGNOSIS — C3490 Malignant neoplasm of unspecified part of unspecified bronchus or lung: Secondary | ICD-10-CM

## 2014-10-21 DIAGNOSIS — Z87891 Personal history of nicotine dependence: Secondary | ICD-10-CM | POA: Insufficient documentation

## 2014-10-21 MED ORDER — DEXAMETHASONE SODIUM PHOSPHATE 10 MG/ML IJ SOLN
10.0000 mg | Freq: Once | INTRAMUSCULAR | Status: AC
  Administered 2014-10-21: 10 mg via INTRAMUSCULAR

## 2014-10-21 MED ORDER — TRAMADOL HCL 50 MG PO TABS
50.0000 mg | ORAL_TABLET | Freq: Once | ORAL | Status: AC
  Administered 2014-10-21: 50 mg via ORAL

## 2014-10-21 MED ORDER — LIDOCAINE VISCOUS 2 % MT SOLN: 20.0000 mL | Freq: Four times a day (QID) | OROMUCOSAL | Status: DC | PRN

## 2014-10-21 MED ORDER — TRAMADOL HCL 50 MG PO TABS
ORAL_TABLET | ORAL | Status: AC
  Administered 2014-10-21: 50 mg via ORAL
  Filled 2014-10-21: qty 1

## 2014-10-21 MED ORDER — DEXAMETHASONE SODIUM PHOSPHATE 10 MG/ML IJ SOLN
INTRAMUSCULAR | Status: AC
  Administered 2014-10-21: 10 mg via INTRAMUSCULAR
  Filled 2014-10-21: qty 1

## 2014-10-21 MED ORDER — LIDOCAINE VISCOUS 2 % MT SOLN
OROMUCOSAL | Status: AC
Start: 2014-10-21 — End: 2014-10-21
  Administered 2014-10-21: 15 mL via OROMUCOSAL
  Filled 2014-10-21: qty 15

## 2014-10-21 MED ORDER — LIDOCAINE VISCOUS 2 % MT SOLN
15.0000 mL | Freq: Once | OROMUCOSAL | Status: AC
  Administered 2014-10-21: 15 mL via OROMUCOSAL

## 2014-10-21 MED ORDER — IBUPROFEN 800 MG PO TABS
ORAL_TABLET | ORAL | Status: AC
  Administered 2014-10-21: 800 mg via ORAL
  Filled 2014-10-21: qty 1

## 2014-10-21 MED ORDER — IBUPROFEN 100 MG/5ML PO SUSP: 800.0000 mg | Freq: Once | ORAL | Status: DC

## 2014-10-21 MED ORDER — IBUPROFEN 800 MG PO TABS
800.0000 mg | ORAL_TABLET | Freq: Once | ORAL | Status: AC
  Administered 2014-10-21: 800 mg via ORAL

## 2014-10-21 MED ORDER — TRAMADOL HCL 50 MG PO TABS: 50.0000 mg | ORAL_TABLET | Freq: Four times a day (QID) | ORAL | Status: AC | PRN

## 2014-10-21 NOTE — ED Notes (Addendum)
Patient ambulatory to triage with steady gait, without difficulty or distress noted; pt reports tonsillectomy on Friday; st was trying not to take pain medication due to hx addiction and "poured it out"; but now having too much pain

## 2014-10-21 NOTE — Telephone Encounter (Signed)
-  had tonsils removed recently .Pt wants to r/s with Julien Nordmann, Needs lab for Ct , Onc Tx request sent.

## 2014-10-21 NOTE — ED Provider Notes (Signed)
[facility] Emergency Department Provider Note  ____________________________________________  Time seen: Approximately 625 AM  I have reviewed the triage vital signs and the nursing notes.   HISTORY  Chief Complaint Post-op Problem    HPI [name] is a 59 y.o. female had a tonsillectomy done approximately 5 days ago. The patient reports that she was given pain medicine but she did so well the first few days that she has not taken pain medicine and decided to pour the pain medicine down the toilet. The patient reports that she has a problem with addiction and she was concerned she may start taking the medication outside of its necessary uses. The patient reports that she was told by the doctor that she will feel worse on day 3 and 4 but did not believe him. She reports that the pain worsened yesterday. She reports that she did not call her doctor because she thought the pain would get better. She has been taking Tylenol for pain but she reports that it is so bad she is unable to swallow. She is also taking a Z-Pak. The patient has not had any fevers or chest pain has had some mild nausea. She reports that her pain is a10 out of 10 in intensity. He reports that it is too painful to open her mouth or swallow.   Past Medical History  Diagnosis Date  . Drug abuse     history of, went to rehab 2015  . Diabetes mellitus without complication   . GERD (gastroesophageal reflux disease)   . Lung cancer     lung ca dx 11/11- right upper lobe    Patient Active Problem List   Diagnosis Date Noted  . Malignant neoplasm of bronchus and lung, unspecified site 04/24/2011    Past Surgical History  Procedure Laterality Date  . Tubal ligation    . Lung lobectomy Right 2011    RUL removed for lung cancer  . Ankle fracture surgery Left   . Tonsillectomy Bilateral 10/16/2014    Procedure: BILATERAL TONSILLECTOMY;  Surgeon: Rozetta Nunnery, MD;  Location: [facility];  Service: ENT;  Laterality: Bilateral;  . Styloid process excision Left 10/16/2014    Procedure: EXCISION LEFT STYLOID PROCESS;  Surgeon: Rozetta Nunnery, MD;  Location: [place];  Service: ENT;  Laterality: Left;    Current Outpatient Rx  Name  Route  Sig  Dispense  Refill  . azithromycin (ZITHROMAX) 200 MG/5ML suspension   Oral   Take 6.3 mLs (250 mg total) by mouth daily.   30 mL   0   . HYDROcodone-acetaminophen (HYCET) 7.5-325 mg/15 ml solution   Oral   Take 10-15 mLs by mouth every 6 (six) hours as needed for moderate pain.   420 mL   0   . lidocaine (XYLOCAINE) 2 % solution   Mouth/Throat   Use as directed 20 mLs in the mouth or throat every 6 (six) hours as needed for mouth pain.   100 mL   0   . traMADol (ULTRAM) 50 MG tablet   Oral   Take 1 tablet (50 mg total) by mouth every 6 (six) hours as needed.   12 tablet   0     Allergies Oxycodone; Penicillins; and Sulfa antibiotics  Family History  Problem Relation Age of Onset  . Family history unknown: Yes    Social History History  Substance Use Topics  . Smoking status: Former Smoker  Quit date: 05/15/2013  . Smokeless tobacco: Not on file  . Alcohol Use: No    Review of Systems Constitutional: No fever/chills Eyes: No visual changes. ENT:  sore throat. Cardiovascular: Denies chest pain. Respiratory: Denies shortness of breath. Gastrointestinal: Nausea, No abdominal pain.   no vomiting.   Genitourinary: Negative for dysuria. Musculoskeletal: Negative for back pain. Skin: Negative for rash. Neurological: Negative for headaches,   10-point ROS otherwise negative.  ____________________________________________   PHYSICAL EXAM:  VITAL SIGNS: ED Triage Vitals  Enc Vitals Group     BP 10/21/14 0554 159/100 mmHg     Pulse Rate 10/21/14 0554 92     Resp 10/21/14 0554 18     Temp 10/21/14 0554 98.1 F (36.7 C)     Temp Source 10/21/14 0554 Oral     SpO2  10/21/14 0554 95 %     Weight 10/21/14 0554 209 lb (94.802 kg)     Height 10/21/14 0554 '5\' 9"'$  (1.753 m)     Head Cir --      Peak Flow --      Pain Score 10/21/14 0555 10     Pain Loc --      Pain Edu? --      Excl. in [place]? --     Constitutional: Alert and oriented. Well appearing and in moderate distress. Eyes: Conjunctivae are normal. PERRL. EOMI. Head: Atraumatic. Nose: No congestion/rhinnorhea. Mouth/Throat: Mucous membranes are moist.  Oropharynx with healing pseudomembranous yellow in color mildly erythematous. Cardiovascular: Normal rate, regular rhythm. Grossly normal heart sounds.  Good peripheral circulation. Respiratory: Normal respiratory effort.  No retractions. Lungs CTAB. Gastrointestinal: Soft and nontender. No distention.  Genitourinary: Deferred Musculoskeletal: No lower extremity tenderness nor edema.  Neurologic:  Normal speech and language. No gross focal neurologic deficits are appreciated.  Skin:  Skin is warm, dry and intact.  Psychiatric: Mood and affect are normal.   ____________________________________________   LABS (all labs ordered are listed, but only abnormal results are displayed)  Labs Reviewed - No data to display ____________________________________________  EKG  None ____________________________________________  RADIOLOGY  None ____________________________________________   PROCEDURES  Procedure(s) performed: None  Critical Care performed: No  ____________________________________________   INITIAL IMPRESSION / ASSESSMENT AND PLAN / ED COURSE  Pertinent labs & imaging results that were available during my care of the patient were reviewed by me and considered in my medical decision making (see chart for details).  This is a 59 year old female who comes in today with throat pain after a tonsillectomy approximately 5 days ago. The patient reports that she did throw away her pain medication. I have informed her that I have no  way to holiday that she has thrown away her medications. I'll treat her with steroids anti-inflammatories and tramadol and discharged to follow-up with her surgeon.  After the medication the patient reports that she did feel improved. She did receive Decadron, viscous lidocaine, ibuprofen and tramadol. She will be discharged to follow-up. ____________________________________________   FINAL CLINICAL IMPRESSION(S) / ED DIAGNOSES  Final diagnoses:  Post-tonsillectomy pain      Loney Hering, MD 10/21/14 [phone]

## 2014-10-22 ENCOUNTER — Telehealth: Payer: Self-pay | Admitting: Internal Medicine

## 2014-10-22 NOTE — Telephone Encounter (Signed)
Confirmed appointment for July. Mailed calendar.

## 2014-10-25 ENCOUNTER — Encounter: Payer: Self-pay | Admitting: *Deleted

## 2014-10-25 ENCOUNTER — Emergency Department
Admission: EM | Admit: 2014-10-25 | Discharge: 2014-10-25 | Disposition: A | Discharge status: Home / Self Care | Payer: 59 | Attending: Student | Admitting: Student

## 2014-10-25 DIAGNOSIS — Z87891 Personal history of nicotine dependence: Secondary | ICD-10-CM | POA: Insufficient documentation

## 2014-10-25 DIAGNOSIS — E119 Type 2 diabetes mellitus without complications: Secondary | ICD-10-CM | POA: Diagnosis not present

## 2014-10-25 DIAGNOSIS — Z79899 Other long term (current) drug therapy: Secondary | ICD-10-CM | POA: Insufficient documentation

## 2014-10-25 DIAGNOSIS — Z880 Allergy status to penicillin: Secondary | ICD-10-CM | POA: Insufficient documentation

## 2014-10-25 DIAGNOSIS — G8918 Other acute postprocedural pain: Secondary | ICD-10-CM | POA: Insufficient documentation

## 2014-10-25 DIAGNOSIS — Z792 Long term (current) use of antibiotics: Secondary | ICD-10-CM | POA: Diagnosis not present

## 2014-10-25 DIAGNOSIS — Z9089 Acquired absence of other organs: Secondary | ICD-10-CM | POA: Diagnosis not present

## 2014-10-25 LAB — BASIC METABOLIC PANEL
ANION GAP: 5 (ref 5–15)
BUN: 15 mg/dL (ref 6–20)
CHLORIDE: 108 mmol/L (ref 101–111)
CO2: 25 mmol/L (ref 22–32)
Calcium: 8.6 mg/dL — ABNORMAL LOW (ref 8.9–10.3)
Creatinine, Ser: 0.55 mg/dL (ref 0.44–1.00)
GFR calc non Af Amer: >60 mL/min (ref >60)
GLUCOSE: 123 mg/dL — AB (ref 65–99)
Potassium: 3.3 mmol/L — ABNORMAL LOW (ref 3.5–5.1)
Sodium: 138 mmol/L (ref 135–145)

## 2014-10-25 LAB — CBC WITH DIFFERENTIAL/PLATELET
BASOS ABS: 0 10*3/uL (ref 0–0.1)
BASOS PCT: 0 %
EOS PCT: 3 %
Eosinophils Absolute: 0.2 10*3/uL (ref 0–0.7)
HEMATOCRIT: 42.2 % (ref 35.0–47.0)
HEMOGLOBIN: 14.2 g/dL (ref 12.0–16.0)
Lymphocytes Relative: 32 %
Lymphs Abs: 1.9 10*3/uL (ref 1.0–3.6)
MCH: 31.4 pg (ref 26.0–34.0)
MCHC: 33.8 g/dL (ref 32.0–36.0)
MCV: 93 fL (ref 80.0–100.0)
MONO ABS: 0.5 10*3/uL (ref 0.2–0.9)
Monocytes Relative: 9 %
Neutro Abs: 3.2 10*3/uL (ref 1.4–6.5)
Neutrophils Relative %: 56 %
Platelets: 242 10*3/uL (ref 150–440)
RBC: 4.54 MIL/uL (ref 3.80–5.20)
RDW: 13.1 % (ref 11.5–14.5)
WBC: 5.8 10*3/uL (ref 3.6–11.0)

## 2014-10-25 MED ORDER — HYDROCODONE-ACETAMINOPHEN 7.5-325 MG/15ML PO SOLN: 15.0000 mL | Freq: Four times a day (QID) | ORAL | Status: AC | PRN

## 2014-10-25 MED ORDER — ONDANSETRON HCL 4 MG/2ML IJ SOLN
INTRAMUSCULAR | Status: AC
  Filled 2014-10-25: qty 2

## 2014-10-25 MED ORDER — MORPHINE SULFATE 4 MG/ML IJ SOLN
4.0000 mg | Freq: Once | INTRAMUSCULAR | Status: AC
  Administered 2014-10-25: 4 mg via INTRAVENOUS

## 2014-10-25 MED ORDER — ONDANSETRON HCL 4 MG/2ML IJ SOLN
4.0000 mg | Freq: Once | INTRAMUSCULAR | Status: AC
  Administered 2014-10-25: 4 mg via INTRAVENOUS

## 2014-10-25 MED ORDER — MORPHINE SULFATE 4 MG/ML IJ SOLN
INTRAMUSCULAR | Status: AC
  Filled 2014-10-25: qty 1

## 2014-10-25 NOTE — ED Notes (Signed)
Pt states "when is the doctor coming in, i don't want to wait much longer." pt states "i'm just worried my throat is infected." md to see pt.

## 2014-10-25 NOTE — Discharge Instructions (Signed)
Do not drive while taking any narcotics. Call your ENT doctor, Dr. Bobbe Medico, tomorrow morning and schedule an appointment to be seen in his her clinic. Return to the emergency room for severe or worsening pain, throat swelling, bleeding from throat, fevers, vomiting, diarrhea, chest pain, difficulty breathing, or for any other concerns.

## 2014-10-25 NOTE — ED Notes (Addendum)
Pt continues to curse loudly at [employer] station. Pt states "let me go the fuck outside, give me my prescription and let me go, i've got to leave, my car's out there." police back in to speak with pt regarding risks of leaving after morphine administration. MD states will not discharge pt until 9am or until a driver arrives for pt. Pt informed of md request. Police state will hold onto pt's keys until 9am. Pt states "i just want to go the fuck outside." RN, police and md to allow pt outside with police escort and pt to return to room after "getting some fresh air" per pt.

## 2014-10-25 NOTE — ED Notes (Signed)
Pt had a tonsillectomy on 10/16/14. Pt states since 10/19/14 she has had increased pain and decreased ability to swallow. Pt's voice is hoarse and altered. Pt denies difficulty breathing.

## 2014-10-25 NOTE — ED Provider Notes (Signed)
[facility] Emergency Department Provider Note  ____________________________________________  Time seen: Approximately 6:02 AM  I have reviewed the triage vital signs and the nursing notes.   HISTORY  Chief Complaint Post-op Problem    HPI [name] is a 59 y.o. female status post tonsillectomy on 10/16/2014 who presents for evaluation of gradual onset worsening constant throat pain since tonsillectomy. She was here in this ER on 10/21/2014 with similar complaints. She reported at that time that, in the past, she had a problem with drug addiction and instead of taking her narcotic pain medications, she threw them away. She reports the same thing to me at this point. She has been taking tramadol for pain which has not been helpful. She reports her pain is 10 out of 10. Talking makes the pain worse. She has been able to eat and drink although both are painful. She denies any fevers, chills, vomiting, diarrhea, blood from her throat. No modifying factors.   Past Medical History  Diagnosis Date  . Drug abuse     history of, went to rehab 2015  . Diabetes mellitus without complication   . GERD (gastroesophageal reflux disease)   . Lung cancer     lung ca dx 11/11- right upper lobe    Patient Active Problem List   Diagnosis Date Noted  . Lung cancer 04/24/2011    Past Surgical History  Procedure Laterality Date  . Tubal ligation    . Lung lobectomy Right 2011    RUL removed for lung cancer  . Ankle fracture surgery Left   . Tonsillectomy Bilateral 10/16/2014    Procedure: BILATERAL TONSILLECTOMY;  Surgeon: Rozetta Nunnery, MD;  Location: [place];  Service: ENT;  Laterality: Bilateral;  . Styloid process excision Left 10/16/2014    Procedure: EXCISION LEFT STYLOID PROCESS;  Surgeon: Rozetta Nunnery, MD;  Location: [place];  Service: ENT;  Laterality: Left;    Current Outpatient Rx  Name  Route  Sig  Dispense   Refill  . azithromycin (ZITHROMAX) 200 MG/5ML suspension   Oral   Take 6.3 mLs (250 mg total) by mouth daily.   30 mL   0   . HYDROcodone-acetaminophen (HYCET) 7.5-325 mg/15 ml solution   Oral   Take 10-15 mLs by mouth every 6 (six) hours as needed for moderate pain.   420 mL   0   . lidocaine (XYLOCAINE) 2 % solution   Mouth/Throat   Use as directed 20 mLs in the mouth or throat every 6 (six) hours as needed for mouth pain.   100 mL   0   . traMADol (ULTRAM) 50 MG tablet   Oral   Take 1 tablet (50 mg total) by mouth every 6 (six) hours as needed.   12 tablet   0     Allergies Oxycodone; Penicillins; and Sulfa antibiotics  Family History  Problem Relation Age of Onset  . Family history unknown: Yes    Social History History  Substance Use Topics  . Smoking status: Former Smoker    Quit date: 05/15/2013  . Smokeless tobacco: Not on file  . Alcohol Use: No    Review of Systems Constitutional: No fever/chills Eyes: No visual changes. ENT: No sore throat. Cardiovascular: Denies chest pain. Respiratory: Denies shortness of breath. Gastrointestinal: No abdominal pain.  No nausea, no vomiting.  No diarrhea.  No constipation. Genitourinary: Negative for dysuria. Musculoskeletal: Negative for back pain. Skin: Negative for rash.  Neurological: Negative for headaches, focal weakness or numbness.  10-point ROS otherwise negative.  ____________________________________________   PHYSICAL EXAM:  VITAL SIGNS: ED Triage Vitals  Enc Vitals Group     BP 10/25/14 0205 121/88 mmHg     Pulse Rate 10/25/14 0205 80     Resp 10/25/14 0205 20     Temp 10/25/14 0205 98.4 F (36.9 C)     Temp Source 10/25/14 0205 Oral     SpO2 10/25/14 0205 96 %     Weight 10/25/14 0205 209 lb (94.802 kg)     Height 10/25/14 0205 '5\' 6"'$  (1.676 m)     Head Cir --      Peak Flow --      Pain Score 10/25/14 0206 10     Pain Loc --      Pain Edu? --      Excl. in [place]? --      Constitutional: Alert and oriented. Well appearing and in no acute distress. Eyes: Conjunctivae are normal. PERRL. EOMI. Head: Atraumatic. Nose: No congestion/rhinnorhea. Mouth/Throat: Mucous membranes are moist.  Gray eschar of the oropharynx bilaterally, no blood, no asymmetry. No hoarse voice. Neck: No stridor.  Cardiovascular: Normal rate, regular rhythm. Grossly normal heart sounds.  Good peripheral circulation. Respiratory: Normal respiratory effort.  No retractions. Lungs CTAB. Gastrointestinal: Soft and nontender. No distention. No abdominal bruits. No CVA tenderness. Genitourinary: deferred Musculoskeletal: No lower extremity tenderness nor edema.  No joint effusions. Neurologic:  Normal speech and language. No gross focal neurologic deficits are appreciated. Speech is normal. No gait instability. Skin:  Skin is warm, dry and intact. No rash noted. Psychiatric: Mood and affect are normal. Speech and behavior are normal.  ____________________________________________   LABS (all labs ordered are listed, but only abnormal results are displayed)  Labs Reviewed  BASIC METABOLIC PANEL - Abnormal; Notable for the following:    Potassium 3.3 (*)    Glucose, Bld 123 (*)    Calcium 8.6 (*)    All other components within normal limits  CBC WITH DIFFERENTIAL/PLATELET   ____________________________________________  EKG  normal ____________________________________________  RADIOLOGY  normal ____________________________________________   PROCEDURES  Procedure(s) performed: None  Critical Care performed: No  ____________________________________________   INITIAL IMPRESSION / ASSESSMENT AND PLAN / ED COURSE  Pertinent labs & imaging results that were available during my care of the patient were reviewed by me and considered in my medical decision making (see chart for details).  [name] is a 59 y.o. female status post tonsillectomy on 10/16/2014 who presents  for evaluation of gradual onset worsening constant throat pain since tonsillectomy. On exam, she is very well-appearing in no acute distress. Vital signs stable, she is afebrile. She has a gray eschar covering the oropharynx bilaterally which is normal and to be expected during the postoperative period. No leukocytosis, no fever. She is not speaking with a hoarse voice, she is tolerating by mouth intake. I discussed her that we will send her home with 1 day of hycet and that she needs to follow-up with her ENT surgeon in [facility] tomorrow. She voices understanding of this. I discussed the case with Dr. Kathyrn Sheriff, on call for ENT, who reports that prolonged pain in the adult patient is common, recommends against any imaging. She reported initially to her nurse that she had a ride home and was given morphine however now she reports that she does not have a ride home and is insistent upon driving. We discussed she needs to  stay in the emergency department for 6 hours after morphine of administration before driving. She is unwilling to do this. We discussed that she would be liable for driving under the influence/intoxicated should she be stopped by police/get into an MVA and had a police officer come and speak with her. She is cursing, wanting to leave. She will not be formally discharged with a prescription for narcotics until 9 AM. She understands this.  ____________________________________________   FINAL CLINICAL IMPRESSION(S) / ED DIAGNOSES  Final diagnoses:  Post-tonsillectomy pain      Joanne Gavel, MD 10/25/14 [phone]

## 2014-10-25 NOTE — ED Notes (Signed)
Report to misty, rn.

## 2014-10-25 NOTE — ED Notes (Signed)
Pt states 'i'm going outside, fuck you, let the cops arrest me if they want to, i got to leave and go home now." pt offered cab home, pt declines.

## 2014-10-25 NOTE — ED Notes (Signed)
Pt up with clothing on, states "i want the hell out of here, the doctor said she's letting me go". Pt previously informed will need a driver home if she received morphine iv, pt stated at that time "i got dropped off, they're coming to pick me up." pt now states she has no driver home. Police in to speak with pt regarding risks of driving impaired. Pt states to this rn "i don't give a fuck, let me out of here". Pt informed cannot be held against her will, however, megan, with [place] police informing pt not to drive and risk associated with driving after morphine administration.

## 2014-10-25 NOTE — ED Notes (Signed)
Charge rn aware pt currently outside with Engineer, structural.

## 2014-11-12 ENCOUNTER — Telehealth: Payer: Self-pay | Admitting: Internal Medicine

## 2014-11-12 NOTE — Telephone Encounter (Signed)
returned call and s.w. pt and confirm all appts....pt ok and aware

## 2014-11-13 ENCOUNTER — Ambulatory Visit (HOSPITAL_COMMUNITY)
Admission: RE | Admit: 2014-11-13 | Discharge: 2014-11-13 | Disposition: A | Discharge status: Home / Self Care | Payer: 59 | Source: Ambulatory Visit | Attending: Internal Medicine | Admitting: Internal Medicine

## 2014-11-13 ENCOUNTER — Encounter (HOSPITAL_COMMUNITY): Payer: Self-pay

## 2014-11-13 ENCOUNTER — Other Ambulatory Visit (HOSPITAL_BASED_OUTPATIENT_CLINIC_OR_DEPARTMENT_OTHER): Payer: 59

## 2014-11-13 DIAGNOSIS — Z85118 Personal history of other malignant neoplasm of bronchus and lung: Secondary | ICD-10-CM | POA: Insufficient documentation

## 2014-11-13 DIAGNOSIS — C3490 Malignant neoplasm of unspecified part of unspecified bronchus or lung: Secondary | ICD-10-CM | POA: Diagnosis not present

## 2014-11-13 LAB — CBC WITH DIFFERENTIAL/PLATELET
BASO%: 0.5 % (ref 0.0–2.0)
Basophils Absolute: 0 10*3/uL (ref 0.0–0.1)
EOS ABS: 0.1 10*3/uL (ref 0.0–0.5)
EOS%: 2.2 % (ref 0.0–7.0)
HCT: 41.1 % (ref 34.8–46.6)
HGB: 14.1 g/dL (ref 11.6–15.9)
LYMPH%: 32.7 % (ref 14.0–49.7)
MCH: 32.1 pg (ref 25.1–34.0)
MCHC: 34.3 g/dL (ref 31.5–36.0)
MCV: 93.6 fL (ref 79.5–101.0)
MONO#: 0.4 10*3/uL (ref 0.1–0.9)
MONO%: 10.2 % (ref 0.0–14.0)
NEUT#: 2.2 10*3/uL (ref 1.5–6.5)
NEUT%: 54.4 % (ref 38.4–76.8)
Platelets: 196 10*3/uL (ref 145–400)
RBC: 4.39 10*6/uL (ref 3.70–5.45)
RDW: 13.5 % (ref 11.2–14.5)
WBC: 4 10*3/uL (ref 3.9–10.3)
lymph#: 1.3 10*3/uL (ref 0.9–3.3)

## 2014-11-13 LAB — COMPREHENSIVE METABOLIC PANEL (CC13)
ALBUMIN: 3.4 g/dL — AB (ref 3.5–5.0)
ALK PHOS: 90 U/L (ref 40–150)
ALT: 18 U/L (ref 0–55)
AST: 13 U/L (ref 5–34)
Anion Gap: 11 mEq/L (ref 3–11)
BUN: 10 mg/dL (ref 7.0–26.0)
CHLORIDE: 105 meq/L (ref 98–109)
CO2: 27 mEq/L (ref 22–29)
Calcium: 9.2 mg/dL (ref 8.4–10.4)
Creatinine: 0.7 mg/dL (ref 0.6–1.1)
Glucose: 124 mg/dl (ref 70–140)
POTASSIUM: 3.3 meq/L — AB (ref 3.5–5.1)
Sodium: 143 mEq/L (ref 136–145)
TOTAL PROTEIN: 6.2 g/dL — AB (ref 6.4–8.3)
Total Bilirubin: 0.32 mg/dL (ref 0.20–1.20)

## 2014-11-13 MED ORDER — IOHEXOL 300 MG/ML  SOLN
100.0000 mL | Freq: Once | INTRAMUSCULAR | Status: AC | PRN
  Administered 2014-11-13: 80 mL via INTRAVENOUS

## 2014-11-17 ENCOUNTER — Ambulatory Visit (HOSPITAL_BASED_OUTPATIENT_CLINIC_OR_DEPARTMENT_OTHER): Payer: 59 | Admitting: Internal Medicine

## 2014-11-17 ENCOUNTER — Encounter: Payer: Self-pay | Admitting: Internal Medicine

## 2014-11-17 VITALS — BP 113/55 | HR 91 | Temp 97.8°F | Resp 20 | Ht 66.0 in | Wt 209.6 lb

## 2014-11-17 DIAGNOSIS — C3490 Malignant neoplasm of unspecified part of unspecified bronchus or lung: Secondary | ICD-10-CM

## 2014-11-17 DIAGNOSIS — Z85118 Personal history of other malignant neoplasm of bronchus and lung: Secondary | ICD-10-CM | POA: Diagnosis not present

## 2014-11-17 NOTE — Progress Notes (Signed)
[name]   [phone]  OFFICE PROGRESS NOTE  DIAGNOSIS: Stage IIA (T2b N0 M0) non-small cell lung cancer consistent with adenocarcinoma with negative EGFR mutation and negative ALK gene translocation diagnosed in September 2010.   PRIOR THERAPY: :  1. Status post right upper lobectomy with lymph node dissection on March 18, 2010, under the care of Dr. Arlyce Dice. 2. Status post 4 cycles of adjuvant chemotherapy with carboplatin and Alimta. Last dose was given July 13, 2010.  CURRENT THERAPY: Observation..  INTERVAL HISTORY: [name] 59 y.o. female returns to the clinic today for four-month followup visit. The patient is doing fine today with no specific complaints except for: sore throat and feeling of fullness in her neck. She is feeling much better. She denied having any other significant complaints. She has no chest pain, shortness of breath, cough or hemoptysis. She has no nausea or vomiting. The patient has repeat CT scan of the chest performed recently and she is here today for evaluation and discussion of her scan results.  MEDICAL HISTORY: Past Medical History  Diagnosis Date  . Drug abuse     history of, went to rehab 2015  . Diabetes mellitus without complication   . GERD (gastroesophageal reflux disease)   . Lung cancer     lung ca dx 11/11- right upper lobe    ALLERGIES:  is allergic to oxycodone; penicillins; and sulfa antibiotics.  MEDICATIONS:  Current Outpatient Prescriptions  Medication Sig Dispense Refill  . azithromycin (ZITHROMAX) 200 MG/5ML suspension Take 6.3 mLs (250 mg total) by mouth daily. (Patient not taking: Reported on 11/17/2014) 30 mL 0  . fluconazole (DIFLUCAN) 150 MG tablet   1  . HYDROcodone-acetaminophen (HYCET) 7.5-325 mg/15 ml solution Take 15 mLs by mouth every 6 (six) hours as needed for moderate pain. Dispense 1 day supply only. (Patient not taking: Reported on 11/17/2014) 60 mL 0  .  HYDROMET 5-1.5 MG/5ML syrup TK 5 MLS PO Q 6 H PRF COUGH  0  . lidocaine (XYLOCAINE) 2 % solution Use as directed 20 mLs in the mouth or throat every 6 (six) hours as needed for mouth pain. (Patient not taking: Reported on 11/17/2014) 100 mL 0  . nitrofurantoin, macrocrystal-monohydrate, (MACROBID) 100 MG capsule   0  . traMADol (ULTRAM) 50 MG tablet Take 1 tablet (50 mg total) by mouth every 6 (six) hours as needed. (Patient not taking: Reported on 11/17/2014) 12 tablet 0   No current facility-administered medications for this visit.    REVIEW OF SYSTEMS:  A comprehensive review of systems was negative.   PHYSICAL EXAMINATION: General appearance: alert, cooperative and no distress Neck: no adenopathy Resp: clear to auscultation bilaterally Cardio: regular rate and rhythm, S1, S2 normal, no murmur, click, rub or gallop GI: soft, non-tender; bowel sounds normal; no masses,  no organomegaly Extremities: extremities normal, atraumatic, no cyanosis or edema  ECOG PERFORMANCE STATUS: 1 - Symptomatic but completely ambulatory  Blood pressure 113/55, pulse 91, temperature 97.8 F (36.6 C), temperature source Oral, resp. rate 20, height $RemoveBe'5\' 6"'acSvrFPXs$  (1.676 m), weight 209 lb 9.6 oz (95.074 kg), SpO2 98 %.  LABORATORY DATA: Lab Results  Component Value Date   WBC 4.0 11/13/2014   HGB 14.1 11/13/2014   HCT 41.1 11/13/2014   MCV 93.6 11/13/2014   PLT 196 11/13/2014      Chemistry      Component Value Date/Time   NA 143 11/13/2014 0902  NA 138 10/25/2014 0457   NA 141 09/01/2011 0909   K 3.3* 11/13/2014 0902   K 3.3* 10/25/2014 0457   K 4.2 09/01/2011 0909   CL 108 10/25/2014 0457   CL 101 09/01/2011 0909   CO2 27 11/13/2014 0902   CO2 25 10/25/2014 0457   CO2 26 09/01/2011 0909   BUN 10.0 11/13/2014 0902   BUN 15 10/25/2014 0457   BUN 14 09/01/2011 0909   CREATININE 0.7 11/13/2014 0902   CREATININE 0.55 10/25/2014 0457   CREATININE 0.6 09/01/2011 0909      Component Value Date/Time    CALCIUM 9.2 11/13/2014 0902   CALCIUM 8.6* 10/25/2014 0457   CALCIUM 8.5 09/01/2011 0909   ALKPHOS 90 11/13/2014 0902   ALKPHOS 99* 09/01/2011 0909   ALKPHOS 123* 07/13/2010 1156   AST 13 11/13/2014 0902   AST 19 09/01/2011 0909   AST 15 07/13/2010 1156   ALT 18 11/13/2014 0902   ALT 23 09/01/2011 0909   ALT 16 07/13/2010 1156   BILITOT 0.32 11/13/2014 0902   BILITOT 0.50 09/01/2011 0909   BILITOT 0.2* 07/13/2010 1156       RADIOGRAPHIC STUDIES: Ct Chest W Contrast  11/13/2014   CLINICAL DATA:  Restaging lung cancer. Diagnosis 2010 with right upper lobectomy. Chemotherapy complete.  EXAM: CT CHEST WITH CONTRAST  TECHNIQUE: Multidetector CT imaging of the chest was performed during intravenous contrast administration.  CONTRAST:  97mL OMNIPAQUE IOHEXOL 300 MG/ML  SOLN  COMPARISON:  CT 09/05/2013  FINDINGS: Mediastinum/Nodes: No axillary or supraclavicular adenopathy. No mediastinal hilar adenopathy. No pericardial fluid. Esophagus normal.  Lungs/Pleura: Postsurgical change in the right upper lobe. No nodularity along the surgical margin. There is volume loss in the right hemi thorax. No nodularity. Left lung is clear. One focus of pleural thickening at the left lung base measures 4 mm on image 37, series 5 is not changed from prior.  Upper abdomen: Limited view of the liver, kidneys, pancreas are unremarkable. Normal adrenal glands.  Musculoskeletal: No acute osseous abnormality.  IMPRESSION: 1. Stable postsurgical change in the right hemi thorax. 2. No evidence of lung cancer recurrence or metastasis.   Electronically Signed   By: Suzy Bouchard M.D.   On: 11/13/2014 11:15   ASSESSMENT AND PLAN: This is a very pleasant 59 years old white female with history of stage IIA non-small cell lung cancer status post resection followed by adjuvant chemotherapy. The patient is doing fine and she has no evidence for disease recurrence.  Her recent CT scan of the chest showed no evidence for disease  recurrence. I discussed the scan results with the patient today. I recommended for her to continue on observation with routine follow-up visit with her primary care physician. I will see the patient on as-needed basis at this point since she has been close to 6 years since her diagnosis. She was advised to call me immediately if she has any concerning symptoms in the interval.  All questions were answered. The patient knows to call the clinic with any problems, questions or concerns. We can certainly see the patient much sooner if necessary.  Disclaimer: This note was dictated with voice recognition software. Similar sounding words can inadvertently be transcribed and may not be corrected upon review.

## 2015-01-01 DIAGNOSIS — F1110 Opioid abuse, uncomplicated: Secondary | ICD-10-CM | POA: Insufficient documentation

## 2015-01-11 ENCOUNTER — Other Ambulatory Visit: Payer: Self-pay | Admitting: Otolaryngology

## 2015-01-11 DIAGNOSIS — J392 Other diseases of pharynx: Secondary | ICD-10-CM

## 2015-01-14 ENCOUNTER — Ambulatory Visit
Admission: RE | Admit: 2015-01-14 | Discharge: 2015-01-14 | Disposition: A | Discharge status: Home / Self Care | Payer: 59 | Source: Ambulatory Visit | Attending: Otolaryngology | Admitting: Otolaryngology

## 2015-01-14 DIAGNOSIS — J392 Other diseases of pharynx: Secondary | ICD-10-CM | POA: Diagnosis present

## 2015-01-14 MED ORDER — IOHEXOL 300 MG/ML  SOLN
75.0000 mL | Freq: Once | INTRAMUSCULAR | Status: AC | PRN
  Administered 2015-01-14: 75 mL via INTRAVENOUS

## 2015-03-02 ENCOUNTER — Emergency Department
Admission: EM | Admit: 2015-03-02 | Discharge: 2015-03-02 | Disposition: A | Discharge status: Home / Self Care | Payer: 59 | Attending: Emergency Medicine | Admitting: Emergency Medicine

## 2015-03-02 ENCOUNTER — Emergency Department: Payer: 59

## 2015-03-02 DIAGNOSIS — M25562 Pain in left knee: Secondary | ICD-10-CM | POA: Insufficient documentation

## 2015-03-02 DIAGNOSIS — Z87891 Personal history of nicotine dependence: Secondary | ICD-10-CM | POA: Insufficient documentation

## 2015-03-02 DIAGNOSIS — R0600 Dyspnea, unspecified: Secondary | ICD-10-CM | POA: Insufficient documentation

## 2015-03-02 DIAGNOSIS — Z79899 Other long term (current) drug therapy: Secondary | ICD-10-CM | POA: Insufficient documentation

## 2015-03-02 DIAGNOSIS — Z792 Long term (current) use of antibiotics: Secondary | ICD-10-CM | POA: Insufficient documentation

## 2015-03-02 MED ORDER — KETOROLAC TROMETHAMINE 60 MG/2ML IM SOLN
60.0000 mg | Freq: Once | INTRAMUSCULAR | Status: AC
  Administered 2015-03-02: 60 mg via INTRAMUSCULAR

## 2015-03-02 MED ORDER — ETODOLAC 200 MG PO CAPS: 200.0000 mg | ORAL_CAPSULE | Freq: Three times a day (TID) | ORAL | Status: DC

## 2015-03-02 MED ORDER — KETOROLAC TROMETHAMINE 60 MG/2ML IM SOLN
INTRAMUSCULAR | Status: AC
  Administered 2015-03-02: 60 mg via INTRAMUSCULAR
  Filled 2015-03-02: qty 2

## 2015-03-02 NOTE — ED Notes (Signed)
Pt refused vitals at discharge.

## 2015-03-02 NOTE — ED Notes (Signed)
Pt in with co anterior left knee pain x 3 days, hx of pain to same for months denies injury.  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  Pain when she walks and moves it, wears ace bandage with little relief.

## 2015-03-02 NOTE — ED Notes (Signed)
Pt refuses Klippel that MD ordered.

## 2015-03-02 NOTE — ED Provider Notes (Signed)
[facility] Emergency Department Provider Note  ____________________________________________  Time seen: Approximately 641 AM  I have reviewed the triage vital signs and the nursing notes.   HISTORY  Chief Complaint Knee Pain    HPI [name] is a 59 y.o. female who comes into the hospital with knee pain.The patient reports that she started having pain 3 days ago. The patient reports that she has been taking ibuprofen but it has not been helping. The patient reports that she has been able to bend her knee and walk on her left knee. She reports that when she lays down the pain is not that bad. The patient reports that her pain is 0/10 while she is on the bed. She denies warmth, redness or fevers.    Past Medical History  Diagnosis Date  . Drug abuse     history of, went to rehab 2015  . GERD (gastroesophageal reflux disease)   . Lung cancer     lung ca dx 11/11- right upper lobe    Patient Active Problem List   Diagnosis Date Noted  . Lung cancer ([place]) 04/24/2011    Past Surgical History  Procedure Laterality Date  . Tubal ligation    . Lung lobectomy Right 2011    RUL removed for lung cancer  . Ankle fracture surgery Left   . Tonsillectomy Bilateral 10/16/2014    Procedure: BILATERAL TONSILLECTOMY;  Surgeon: Rozetta Nunnery, MD;  Location: [place];  Service: ENT;  Laterality: Bilateral;  . Styloid process excision Left 10/16/2014    Procedure: EXCISION LEFT STYLOID PROCESS;  Surgeon: Rozetta Nunnery, MD;  Location: [place];  Service: ENT;  Laterality: Left;    Current Outpatient Rx  Name  Route  Sig  Dispense  Refill  . azithromycin (ZITHROMAX) 200 MG/5ML suspension   Oral   Take 6.3 mLs (250 mg total) by mouth daily. Patient not taking: Reported on 11/17/2014   30 mL   0   . etodolac (LODINE) 200 MG capsule   Oral   Take 1 capsule (200 mg total) by mouth every 8 (eight) hours.   15 capsule   0   . fluconazole (DIFLUCAN) 150 MG tablet            1   . HYDROcodone-acetaminophen (HYCET) 7.5-325 mg/15 ml solution   Oral   Take 15 mLs by mouth every 6 (six) hours as needed for moderate pain. Dispense 1 day supply only. Patient not taking: Reported on 11/17/2014   60 mL   0   . HYDROMET 5-1.5 MG/5ML syrup      TK 5 MLS PO Q 6 H PRF COUGH      0     Dispense as written.   . lidocaine (XYLOCAINE) 2 % solution   Mouth/Throat   Use as directed 20 mLs in the mouth or throat every 6 (six) hours as needed for mouth pain. Patient not taking: Reported on 11/17/2014   100 mL   0   . nitrofurantoin, macrocrystal-monohydrate, (MACROBID) 100 MG capsule            0   . traMADol (ULTRAM) 50 MG tablet   Oral   Take 1 tablet (50 mg total) by mouth every 6 (six) hours as needed. Patient not taking: Reported on 11/17/2014   12 tablet   0     Allergies Sulfa antibiotics  Family History  Problem Relation Age of Onset  .  Family history unknown: Yes    Social History Social History  Substance Use Topics  . Smoking status: Former Smoker    Quit date: 05/15/2013  . Smokeless tobacco: Not on file     Comment: E-sig " pnce in a while"  . Alcohol Use: No    Review of Systems Constitutional: No fever/chills Eyes: No visual changes. ENT: No sore throat. Cardiovascular: Denies chest pain. Respiratory: Denies shortness of breath. Gastrointestinal: No abdominal pain.  No nausea, no vomiting.  No diarrhea.  No constipation. Genitourinary: Negative for dysuria. Musculoskeletal: left knee pain Skin: Negative for rash. Neurological: Negative for headaches, focal weakness or numbness.  10-point ROS otherwise negative.  ____________________________________________   PHYSICAL EXAM:  VITAL SIGNS: ED Triage Vitals  Enc Vitals Group     BP 03/02/15 0502 117/92 mmHg     Pulse Rate 03/02/15 0502 96     Resp 03/02/15 0502 22     Temp 03/02/15 0502 98.1 F (36.7 C)      Temp Source 03/02/15 0502 Oral     SpO2 03/02/15 0502 97 %     Weight 03/02/15 0502 208 lb (94.348 kg)     Height 03/02/15 0502 '5\' 6"'$  (1.676 m)     Head Cir --      Peak Flow --      Pain Score 03/02/15 0503 10     Pain Loc --      Pain Edu? --      Excl. in [place]? --     Constitutional: Alert and oriented. Well appearing and in mild distress. Eyes: Conjunctivae are normal. PERRL. EOMI. Head: Atraumatic. Nose: No congestion/rhinnorhea. Mouth/Throat: Mucous membranes are moist.  Oropharynx non-erythematous. Cardiovascular: Normal rate, regular rhythm. Grossly normal heart sounds.  Good peripheral circulation. Respiratory: Normal respiratory effort.  No retractions. Lungs CTAB. Gastrointestinal: Soft and nontender. No distention. Positive bowel sounds Musculoskeletal: Mild tenderness to palpation along medial joint line. Patient also has pain with range of motion. No significant swelling no erythema or ecchymosis.   Neurologic:  Normal speech and language.  Skin:  Skin is warm, dry and intact.  Psychiatric: Mood and affect are normal.   ____________________________________________   LABS (all labs ordered are listed, but only abnormal results are displayed)  Labs Reviewed - No data to display ____________________________________________  EKG  none ____________________________________________  RADIOLOGY  Left knee xray: No evidence of fracture or dislocation, small knee joint effusion noted, mild degenerative change at the left knee ____________________________________________   PROCEDURES  Procedure(s) performed: None  Critical Care performed: No  ____________________________________________   INITIAL IMPRESSION / ASSESSMENT AND PLAN / ED COURSE  Pertinent labs & imaging results that were available during my care of the patient were reviewed by me and considered in my medical decision making (see chart for details).  6 this is a 59 year old female who comes in  today with some left knee pain for the past 3 days. The patient has a small effusion and some mild degenerative change in her knee. The patient does not have any significant swelling or bruising she does have some mild pain with range of motion and some mild tenderness to palpation. I feel that the patient needs to follow up with orthopedic surgery for further evaluation. The patient was given a shot of Toradol and a knee immobilizer. We also gave the patient a [place] to help with her getting around. ____________________________________________   FINAL CLINICAL IMPRESSION(S) / ED DIAGNOSES  Final diagnoses:  Left knee pain  Loney Hering, MD 03/02/15 [phone]

## 2015-03-02 NOTE — ED Notes (Signed)
Patient ambulatory to triage with limping gait, without distress noted; pt reports left knee pain; denies any known injury; st pain increased after working last night

## 2015-03-02 NOTE — Discharge Instructions (Signed)
Joint Pain °Joint pain, which is also called arthralgia, can be caused by many things. Joint pain often goes away when you follow your health care provider's instructions for relieving pain at home. However, joint pain can also be caused by conditions that require further treatment. Common causes of joint pain include: °· Bruising in the area of the joint. °· Overuse of the joint. °· Wear and tear on the joints that occur with aging (osteoarthritis). °· Various other forms of arthritis. °· A buildup of a crystal form of uric acid in the joint (gout). °· Infections of the joint (septic arthritis) or of the bone (osteomyelitis). °Your health care provider may recommend medicine to help with the pain. If your joint pain continues, additional tests may be needed to diagnose your condition. °HOME CARE INSTRUCTIONS °Watch your condition for any changes. Follow these instructions as directed to lessen the pain that you are feeling. °· Take medicines only as directed by your health care provider. °· Rest the affected area for as long as your health care provider says that you should. If directed to do so, raise the painful joint above the level of your heart while you are sitting or lying down. °· Do not do things that cause or worsen pain. °· If directed, apply ice to the painful area: °· Put ice in a plastic bag. °· Place a towel between your skin and the bag. °· Leave the ice on for 20 minutes, 2-3 times per day. °· Wear an elastic bandage, splint, or sling as directed by your health care provider. Loosen the elastic bandage or splint if your fingers or toes become numb and tingle, or if they turn cold and blue. °· Begin exercising or stretching the affected area as directed by your health care provider. Ask your health care provider what types of exercise are safe for you. °· Keep all follow-up visits as directed by your health care provider. This is important. °SEEK MEDICAL CARE IF: °· Your pain increases, and medicine  does not help. °· Your joint pain does not improve within 3 days. °· You have increased bruising or swelling. °· You have a fever. °· You lose 10 lb (4.5 kg) or more without trying. °SEEK IMMEDIATE MEDICAL CARE IF: °· You are not able to move the joint. °· Your fingers or toes become numb or they turn cold and blue. °  °This information is not intended to replace advice given to you by your health care provider. Make sure you discuss any questions you have with your health care provider. °  °Document Released: 05/01/2005 Document Revised: 05/22/2014 Document Reviewed: 02/10/2014 °Elsevier Interactive Patient Education ©2016 [employer] ° °Knee Pain °Knee pain is a very common symptom and can have many causes. Knee pain often goes away when you follow your health care provider's instructions for relieving pain and discomfort at home. However, knee pain can develop into a condition that needs treatment. Some conditions may include: °· Arthritis caused by wear and tear (osteoarthritis). °· Arthritis caused by swelling and irritation (rheumatoid arthritis or gout). °· A cyst or growth in your knee. °· An infection in your knee joint. °· An injury that will not heal. °· Damage, swelling, or irritation of the tissues that support your knee (torn ligaments or tendinitis). °If your knee pain continues, additional tests may be ordered to diagnose your condition. Tests may include X-rays or other imaging studies of your knee. You may also need to have fluid removed from your   knee. Treatment for ongoing knee pain depends on the cause, but treatment may include: °· Medicines to relieve pain or swelling. °· Steroid injections in your knee. °· Physical therapy. °· Surgery. °HOME CARE INSTRUCTIONS °· Take medicines only as directed by your health care provider. °· Rest your knee and keep it raised (elevated) while you are resting. °· Do not do things that cause or worsen pain. °· Avoid high-impact activities or exercises, such  as running, jumping rope, or doing jumping jacks. °· Apply ice to the knee area: °¨ Put ice in a plastic bag. °¨ Place a towel between your skin and the bag. °¨ Leave the ice on for 20 minutes, 2-3 times a day. °· Ask your health care provider if you should wear an elastic knee support. °· Keep a pillow under your knee when you sleep. °· Lose weight if you are overweight. Extra weight can put pressure on your knee. °· Do not use any tobacco products, including cigarettes, chewing tobacco, or electronic cigarettes. If you need help quitting, ask your health care provider. Smoking may slow the healing of any bone and joint problems that you may have. °SEEK MEDICAL CARE IF: °· Your knee pain continues, changes, or gets worse. °· You have a fever along with knee pain. °· Your knee buckles or locks up. °· Your knee becomes more swollen. °SEEK IMMEDIATE MEDICAL CARE IF:  °· Your knee joint feels hot to the touch. °· You have chest pain or trouble breathing. °  °This information is not intended to replace advice given to you by your health care provider. Make sure you discuss any questions you have with your health care provider. °  °Document Released: 02/26/2007 Document Revised: 05/22/2014 Document Reviewed: 12/15/2013 °Elsevier Interactive Patient Education ©2016 [employer] ° °

## 2015-03-05 ENCOUNTER — Emergency Department
Admission: EM | Admit: 2015-03-05 | Discharge: 2015-03-05 | Disposition: A | Discharge status: Home / Self Care | Payer: 59 | Attending: Emergency Medicine | Admitting: Emergency Medicine

## 2015-03-05 DIAGNOSIS — F1210 Cannabis abuse, uncomplicated: Secondary | ICD-10-CM | POA: Insufficient documentation

## 2015-03-05 DIAGNOSIS — F4321 Adjustment disorder with depressed mood: Secondary | ICD-10-CM | POA: Insufficient documentation

## 2015-03-05 DIAGNOSIS — F10120 Alcohol abuse with intoxication, uncomplicated: Secondary | ICD-10-CM | POA: Diagnosis not present

## 2015-03-05 DIAGNOSIS — F1310 Sedative, hypnotic or anxiolytic abuse, uncomplicated: Secondary | ICD-10-CM | POA: Diagnosis not present

## 2015-03-05 DIAGNOSIS — Z87891 Personal history of nicotine dependence: Secondary | ICD-10-CM | POA: Diagnosis not present

## 2015-03-05 DIAGNOSIS — F10920 Alcohol use, unspecified with intoxication, uncomplicated: Secondary | ICD-10-CM

## 2015-03-05 DIAGNOSIS — F919 Conduct disorder, unspecified: Secondary | ICD-10-CM | POA: Diagnosis present

## 2015-03-05 LAB — COMPREHENSIVE METABOLIC PANEL
ALBUMIN: 3.7 g/dL (ref 3.5–5.0)
ALT: 26 U/L (ref 14–54)
AST: 20 U/L (ref 15–41)
Alkaline Phosphatase: 111 U/L (ref 38–126)
Anion gap: 7 (ref 5–15)
BUN: 11 mg/dL (ref 6–20)
CO2: 24 mmol/L (ref 22–32)
Calcium: 9.4 mg/dL (ref 8.9–10.3)
Chloride: 112 mmol/L — ABNORMAL HIGH (ref 101–111)
Creatinine, Ser: 0.66 mg/dL (ref 0.44–1.00)
GFR calc Af Amer: >60 mL/min (ref >60)
GLUCOSE: 102 mg/dL — AB (ref 65–99)
POTASSIUM: 4.4 mmol/L (ref 3.5–5.1)
Sodium: 143 mmol/L (ref 135–145)
TOTAL PROTEIN: 7.2 g/dL (ref 6.5–8.1)
Total Bilirubin: 0.2 mg/dL — ABNORMAL LOW (ref 0.3–1.2)

## 2015-03-05 LAB — URINE DRUG SCREEN, QUALITATIVE (ARMC ONLY)
Amphetamines, Ur Screen: NOT DETECTED
BARBITURATES, UR SCREEN: NOT DETECTED
Benzodiazepine, Ur Scrn: POSITIVE — AB
COCAINE METABOLITE, UR ~~LOC~~: NOT DETECTED
Cannabinoid 50 Ng, Ur ~~LOC~~: NOT DETECTED
MDMA (Ecstasy)Ur Screen: NOT DETECTED
METHADONE SCREEN, URINE: NOT DETECTED
OPIATE, UR SCREEN: NOT DETECTED
Phencyclidine (PCP) Ur S: NOT DETECTED
Tricyclic, Ur Screen: POSITIVE — AB

## 2015-03-05 LAB — CBC
HEMATOCRIT: 45.5 % (ref 35.0–47.0)
HEMOGLOBIN: 15.6 g/dL (ref 12.0–16.0)
MCH: 32.1 pg (ref 26.0–34.0)
MCHC: 34.4 g/dL (ref 32.0–36.0)
MCV: 93.5 fL (ref 80.0–100.0)
Platelets: 194 10*3/uL (ref 150–440)
RBC: 4.86 MIL/uL (ref 3.80–5.20)
RDW: 14.1 % (ref 11.5–14.5)
WBC: 5.2 10*3/uL (ref 3.6–11.0)

## 2015-03-05 LAB — ETHANOL: ALCOHOL ETHYL (B): 179 mg/dL — AB (ref <5)

## 2015-03-05 MED ORDER — DULOXETINE HCL 60 MG PO CPEP: 60.0000 mg | ORAL_CAPSULE | Freq: Every day | ORAL | Status: DC

## 2015-03-05 NOTE — ED Notes (Signed)
Pt stated "I thought my son called the police, so I called them to check".  States lost job of 2 years yesterday, awoke today and had 3 12oz beers this morning.  Pt has hx of depression and bipolar disorder.  Takes Lyrica and Cymbalta at home and something else for anxiety.  Pt denies SI/HI, denies visual or auditory hallucinations.

## 2015-03-05 NOTE — Consult Note (Signed)
[facility] Face-to-Face Psychiatry Consult   Reason for Consult:  Follow up Referring Physician:  Er Patient Identification: [name] MRN:  [id number] Principal Diagnosis: <principal problem not specified> Diagnosis:   Patient Active Problem List   Diagnosis Date Noted  . Lung cancer [facility]) [C34.90] 04/24/2011    Total Time spent with patient: 1 hour  Subjective:   [name] is a 59 y.o. female patient admitted with a H/O   started getting .depressed and the trigger was that she was let go from her joba t [employer] last night after working for 3 days because she was not able to keep up with work .  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 And is currently unemployed and started getting depressed.  Past Psychiatric History: Pt is on Cymbalta 40 mgs po daily from PCP which is not helping her depression  Risk to Self: Is patient at risk for suicide?: No Risk to Others:   Prior Inpatient Therapy:   Prior Outpatient Therapy:    Past Medical History:  Past Medical History  Diagnosis Date  . Drug abuse     history of, went to rehab 2015  . GERD (gastroesophageal reflux disease)   . Lung cancer     lung ca dx 11/11- right upper lobe    Past Surgical History  Procedure Laterality Date  . Tubal ligation    . Lung lobectomy Right 2011    RUL removed for lung cancer  . Ankle fracture surgery Left   . Tonsillectomy Bilateral 10/16/2014    Procedure: BILATERAL TONSILLECTOMY;  Surgeon: Drema Halon, MD;  Location: [facility];  Service: ENT;  Laterality: Bilateral;  . Styloid process excision Left 10/16/2014    Procedure: EXCISION LEFT STYLOID PROCESS;  Surgeon: Drema Halon, MD;  Location: [facility];  Service: ENT;  Laterality: Left;   Family History:  Family History  Problem Relation Age of Onset  . Family history unknown: Yes   Family Psychiatric   History: none Social History:  History  Alcohol Use No     History  Drug Use No    Social History   Social History  . Marital Status: Single    Spouse Name: N/A  . Number of Children: N/A  . Years of Education: N/A   Social History Main Topics  . Smoking status: Former Smoker    Quit date: 05/15/2013  . Smokeless tobacco: Not on file     Comment: E-sig " pnce in a while"  . Alcohol Use: No  . Drug Use: No  . Sexual Activity: Not on file   Other Topics Concern  . Not on file   Social History Narrative   Additional Social History:                          Allergies:   Allergies  Allergen Reactions  . Sulfa Antibiotics Other (See Comments)    Reaction: unknown    Labs:  Results for orders placed or performed during the hospital encounter of 03/05/15 (from the past 48 hour(s))  Comprehensive metabolic panel     Status: Abnormal   Collection Time: 03/05/15  2:28 PM  Result Value Ref Range   Sodium 143 135 - 145 mmol/L   Potassium 4.4 3.5 - 5.1 mmol/L  Chloride 112 (H) 101 - 111 mmol/L   CO2 24 22 - 32 mmol/L   Glucose, Bld 102 (H) 65 - 99 mg/dL   BUN 11 6 - 20 mg/dL   Creatinine, Ser 0.66 0.44 - 1.00 mg/dL   Calcium 9.4 8.9 - 10.3 mg/dL   Total Protein 7.2 6.5 - 8.1 g/dL   Albumin 3.7 3.5 - 5.0 g/dL   AST 20 15 - 41 U/L   ALT 26 14 - 54 U/L   Alkaline Phosphatase 111 38 - 126 U/L   Total Bilirubin 0.2 (L) 0.3 - 1.2 mg/dL   GFR calc non Af Amer >60 >60 mL/min   GFR calc Af Amer >60 >60 mL/min    Comment: (NOTE) The eGFR has been calculated using the CKD EPI equation. This calculation has not been validated in all clinical situations. eGFR's persistently <60 mL/min signify possible Chronic Kidney Disease.    Anion gap 7 5 - 15  Ethanol (ETOH)     Status: Abnormal   Collection Time: 03/05/15  2:28 PM  Result Value Ref Range   Alcohol, Ethyl (B) 179 (H) <5 mg/dL    Comment:        LOWEST DETECTABLE LIMIT FOR SERUM ALCOHOL IS 5 mg/dL FOR  MEDICAL PURPOSES ONLY   CBC     Status: None   Collection Time: 03/05/15  2:28 PM  Result Value Ref Range   WBC 5.2 3.6 - 11.0 K/uL   RBC 4.86 3.80 - 5.20 MIL/uL   Hemoglobin 15.6 12.0 - 16.0 g/dL   HCT 45.5 35.0 - 47.0 %   MCV 93.5 80.0 - 100.0 fL   MCH 32.1 26.0 - 34.0 pg   MCHC 34.4 32.0 - 36.0 g/dL   RDW 14.1 11.5 - 14.5 %   Platelets 194 150 - 440 K/uL  Urine Drug Screen, Qualitative ([facility] only)     Status: Abnormal   Collection Time: 03/05/15  2:28 PM  Result Value Ref Range   Tricyclic, Ur Screen POSITIVE (A) NONE DETECTED   Amphetamines, Ur Screen NONE DETECTED NONE DETECTED   MDMA (Ecstasy)Ur Screen NONE DETECTED NONE DETECTED   Cocaine Metabolite,Ur [place] NONE DETECTED NONE DETECTED   Opiate, Ur Screen NONE DETECTED NONE DETECTED   Phencyclidine (PCP) Ur S NONE DETECTED NONE DETECTED   Cannabinoid 50 Ng, Ur [place] NONE DETECTED NONE DETECTED   Barbiturates, Ur Screen NONE DETECTED NONE DETECTED   Benzodiazepine, Ur Scrn POSITIVE (A) NONE DETECTED   Methadone Scn, Ur NONE DETECTED NONE DETECTED    Comment: (NOTE) 858  Tricyclics, urine               Cutoff 1000 ng/mL 200  Amphetamines, urine             Cutoff 1000 ng/mL 300  MDMA (Ecstasy), urine           Cutoff 500 ng/mL 400  Cocaine Metabolite, urine       Cutoff 300 ng/mL 500  Opiate, urine                   Cutoff 300 ng/mL 600  Phencyclidine (PCP), urine      Cutoff 25 ng/mL 700  Cannabinoid, urine              Cutoff 50 ng/mL 800  Barbiturates, urine             Cutoff 200 ng/mL 900  Benzodiazepine, urine  Cutoff 200 ng/mL 1000 Methadone, urine                Cutoff 300 ng/mL 1100 1200 The urine drug screen provides only a preliminary, unconfirmed 1300 analytical test result and should not be used for non-medical 1400 purposes. Clinical consideration and professional judgment should 1500 be applied to any positive drug screen result due to possible 1600 interfering substances. A more specific alternate  chemical method 1700 must be used in order to obtain a confirmed analytical result.  1800 Gas chromato graphy / mass spectrometry (GC/MS) is the preferred 1900 confirmatory method.     No current facility-administered medications for this encounter.   Current Outpatient Prescriptions  Medication Sig Dispense Refill  . clonazePAM (KLONOPIN) 0.5 MG tablet Take 0.5 mg by mouth 2 (two) times daily as needed for anxiety.    . DULoxetine (CYMBALTA) 30 MG capsule Take 30 mg by mouth daily.    . nortriptyline (PAMELOR) 10 MG capsule Take 40 mg by mouth at bedtime.     Marland Kitchen omeprazole (PRILOSEC) 40 MG capsule Take 40 mg by mouth daily.    . pregabalin (LYRICA) 25 MG capsule Take 25 mg by mouth 2 (two) times daily.    Marland Kitchen azithromycin (ZITHROMAX) 200 MG/5ML suspension Take 6.3 mLs (250 mg total) by mouth daily. (Patient not taking: Reported on 03/05/2015) 30 mL 0  . etodolac (LODINE) 200 MG capsule Take 1 capsule (200 mg total) by mouth every 8 (eight) hours. (Patient not taking: Reported on 03/05/2015) 15 capsule 0  . HYDROcodone-acetaminophen (HYCET) 7.5-325 mg/15 ml solution Take 15 mLs by mouth every 6 (six) hours as needed for moderate pain. Dispense 1 day supply only. (Patient not taking: Reported on 11/17/2014) 60 mL 0  . lidocaine (XYLOCAINE) 2 % solution Use as directed 20 mLs in the mouth or throat every 6 (six) hours as needed for mouth pain. (Patient not taking: Reported on 11/17/2014) 100 mL 0  . traMADol (ULTRAM) 50 MG tablet Take 1 tablet (50 mg total) by mouth every 6 (six) hours as needed. (Patient not taking: Reported on 11/17/2014) 12 tablet 0    Musculoskeletal: Strength & Muscle Tone: within normal limits Gait & Station: normal Patient leans: N/A  Psychiatric Specialty Exam: Review of Systems  All other systems reviewed and are negative.   Blood pressure 102/54, pulse 95, temperature 97.8 F (36.6 C), temperature source Oral, resp. rate 18, height $RemoveBe'5\' 9"'PVlYLRPUr$  (1.753 m), weight 208 lb  (94.348 kg), SpO2 96 %.Body mass index is 30.7 kg/(m^2).  General Appearance: Casual  Eye Contact::  Fair  Speech:  Normal Rate  Volume:  Normal  Mood:  Anxious  Affect:  Appropriate  Thought Process:  Goal Directed  Orientation:  Full (Time, Place, and Person)  Thought Content:  NA  Suicidal Thoughts:  No  Homicidal Thoughts:  No  Memory:  Immediate;   Fair Recent;   Fair Remote;   Fair adequate  Judgement:  Fair  Insight:  Fair  Psychomotor Activity:  Normal  Concentration:  Fair  Recall:  [employer] of Knowledge:Fair  Language: Fair  Akathisia:  No  Handed:  Right  AIMS (if indicated):     Assets:  Communication Skills Desire for Improvement Intimacy  ADL's:  Intact  Cognition: WNL  Sleep:      Treatment Plan Summary: Plan Discharge pt home and will get help on Out pt basis from [facility] with therapy and med mgt. Increase Cymbalta to 6 0 mgs  po daily from 40 mgs for better control of depression.  Disposition: No evidence of imminent risk to self or others at present.    Dewain Penning 03/05/2015 3:53 PM

## 2015-03-05 NOTE — ED Provider Notes (Signed)
[facility] Emergency Department Provider Note     Time seen: ----------------------------------------- 3:04 PM on 03/05/2015 -----------------------------------------    I have reviewed the triage vital signs and the nursing notes.   HISTORY  Chief Complaint Behavior Problem    HPI [name] is a 59 y.o. female brought to the ER by [place] for involuntary commitment. According to the reports she called 911 stating she had a problem with her son area and on arrival she is felt to be drunk and she told discharge she did not want to live. She does have a history of substance abuse and psychological problems according to history. She denies any suicidal falls, states she lost her job last night's when she woke up today she drank because she was depressed.   Past Medical History  Diagnosis Date  . Drug abuse     history of, went to rehab 2015  . GERD (gastroesophageal reflux disease)   . Lung cancer     lung ca dx 11/11- right upper lobe    Patient Active Problem List   Diagnosis Date Noted  . Lung cancer ([place]) 04/24/2011    Past Surgical History  Procedure Laterality Date  . Tubal ligation    . Lung lobectomy Right 2011    RUL removed for lung cancer  . Ankle fracture surgery Left   . Tonsillectomy Bilateral 10/16/2014    Procedure: BILATERAL TONSILLECTOMY;  Surgeon: Rozetta Nunnery, MD;  Location: [facility];  Service: ENT;  Laterality: Bilateral;  . Styloid process excision Left 10/16/2014    Procedure: EXCISION LEFT STYLOID PROCESS;  Surgeon: Rozetta Nunnery, MD;  Location: [place];  Service: ENT;  Laterality: Left;    Allergies Sulfa antibiotics  Social History Social History  Substance Use Topics  . Smoking status: Former Smoker    Quit date: 05/15/2013  . Smokeless tobacco: Not on file     Comment: E-sig " pnce in a while"  . Alcohol Use: No    Review of  Systems Constitutional: Negative for fever. Eyes: Negative for visual changes. ENT: Negative for sore throat. Cardiovascular: Negative for chest pain. Respiratory: Negative for shortness of breath. Gastrointestinal: Negative for abdominal pain, vomiting and diarrhea. Genitourinary: Negative for dysuria. Musculoskeletal: Negative for back pain. Skin: Negative for rash. Neurological: Negative for headaches, focal weakness or numbness. Psychiatric: Positive for depression, negative for SI 10-point ROS otherwise negative.  ____________________________________________   PHYSICAL EXAM:  VITAL SIGNS: ED Triage Vitals  Enc Vitals Group     BP 03/05/15 1424 102/54 mmHg     Pulse Rate 03/05/15 1424 95     Resp 03/05/15 1424 18     Temp 03/05/15 1424 97.8 F (36.6 C)     Temp Source 03/05/15 1424 Oral     SpO2 03/05/15 1424 96 %     Weight 03/05/15 1424 208 lb (94.348 kg)     Height 03/05/15 1424 '5\' 9"'$  (1.753 m)     Head Cir --      Peak Flow --      Pain Score 03/05/15 1425 10     Pain Loc --      Pain Edu? --      Excl. in [place]? --     Constitutional: Alert and oriented. Well appearing and in no distress. Eyes: Conjunctivae are normal. PERRL. Normal extraocular movements. ENT   Head: Normocephalic and atraumatic.   Nose: No congestion/rhinnorhea.   Mouth/Throat: Mucous membranes  are moist.   Neck: No stridor. Cardiovascular: Normal rate, regular rhythm. Normal and symmetric distal pulses are present in all extremities. No murmurs, rubs, or gallops. Respiratory: Normal respiratory effort without tachypnea nor retractions. Breath sounds are clear and equal bilaterally. No wheezes/rales/rhonchi. Gastrointestinal: Soft and nontender. No distention. No abdominal bruits.  Musculoskeletal: Nontender with normal range of motion in all extremities. No joint effusions.  No lower extremity tenderness nor edema. Neurologic:  Normal speech and language. No gross focal neurologic  deficits are appreciated. Speech is normal. No gait instability. Skin:  Skin is warm, dry and intact. No rash noted. Psychiatric: Mood and affect are normal. Speech and behavior are normal. Patient exhibits appropriate insight and judgment.  ____________________________________________  ED COURSE:  Pertinent labs & imaging results that were available during my care of the patient were reviewed by me and considered in my medical decision making (see chart for details). Patient's no acute distress, will need basic labs and reevaluation ____________________________________________    LABS (pertinent positives/negatives)  Labs Reviewed  COMPREHENSIVE METABOLIC PANEL - Abnormal; Notable for the following:    Chloride 112 (*)    Glucose, Bld 102 (*)    Total Bilirubin 0.2 (*)    All other components within normal limits  URINE DRUG SCREEN, QUALITATIVE ([facility] ONLY) - Abnormal; Notable for the following:    Tricyclic, Ur Screen POSITIVE (*)    Benzodiazepine, Ur Scrn POSITIVE (*)    All other components within normal limits  CBC  ETHANOL   ___________________________________________  FINAL ASSESSMENT AND PLAN  Depression, alcohol intoxication  Plan: Patient with labs as dictated above. Patient has been evaluated by psychiatry and is not felt to be a threat to herself or others. She denies suicidality to me. He is stable for outpatient follow-up.   Earleen Newport, MD   Earleen Newport, MD 03/05/15 [phone]

## 2015-03-05 NOTE — ED Notes (Signed)

## 2015-03-05 NOTE — ED Notes (Signed)
Pt brought in by sheriff's department IVC, given cab voucher at discharge and sent to lobby to wait.

## 2015-03-05 NOTE — ED Notes (Signed)
BEHAVIORAL HEALTH ROUNDING Patient sleeping: No. Patient alert and oriented: yes Behavior appropriate: Yes.  ;  Nutrition and fluids offered: Yes  Toileting and hygiene offered: Yes  Sitter present: yes Law enforcement present: Yes  

## 2015-03-05 NOTE — ED Notes (Signed)
Pt here with [place] sher[employer]iff's dept, pt here with IVC papers for, "Respondent called 911 stating she had a problem with her son. Upon arrival, respondent was drunk and told petitioner she didn't want to live. She has a history of substance abuse and psychological problems." pt denies suicidal thoughts at this time, states that she lost her job last night so when she woke up today she drank a couple beers because she was depressed.

## 2015-03-05 NOTE — Discharge Instructions (Signed)
Adjustment Disorder Adjustment disorder is an unusually severe reaction to a stressful life event, such as the loss of a job or physical illness. The event may be any stressful event other than the loss of a loved one. Adjustment disorder may affect your feelings, your thinking, how you act, or a combination of these. It may interfere with personal relationships or with the way you are at work, school, or home. People with this disorder are at risk for suicide and substance abuse. They may develop a more serious mental disorder, such as major depressive disorder or post-traumatic stress disorder. SIGNS AND SYMPTOMS  Symptoms may include:  Sadness, depressed mood, or crying spells.  Loss of enjoyment.  Change in appetite or weight.  Sense of loss or hopelessness.  Thoughts of suicide.  Anxiety, worry, or nervousness.  Trouble sleeping.  Avoiding family and friends.  Poor school performance.  Fighting or vandalism.  Reckless driving.  Skipping school.  Poor work Systems analyst.  Ignoring bills. Symptoms of adjustment disorder start within 3 months of the stressful life event. They do not last more than 6 months after the event has ended. DIAGNOSIS  To make a diagnosis, your health care provider will ask about what has happened in your life and how it has affected you. He or she may also ask about your medical history and use of medicines, alcohol, and other substances. Your health care provider may do a physical exam and order lab tests or other studies. You may be referred to a mental health specialist for evaluation. TREATMENT  Treatment options include:  Counseling or talk therapy. Talk therapy is usually provided by mental health specialists.  Medicine. Certain medicines may help with depression, anxiety, and sleep.  Support groups. Support groups offer emotional support, advice, and guidance. They are made up of people who have had similar experiences. HOME CARE  INSTRUCTIONS  Keep all follow-up visits as directed by your health care provider. This is important.  Take medicines only as directed by your health care provider. SEEK MEDICAL CARE IF:  Your symptoms get worse.  SEEK IMMEDIATE MEDICAL CARE IF: You have serious thoughts about hurting yourself or someone else. MAKE SURE YOU:  Understand these instructions.  Will watch your condition.  Will get help right away if you are not doing well or get worse.   This information is not intended to replace advice given to you by your health care provider. Make sure you discuss any questions you have with your health care provider.   Document Released: 01/03/2006 Document Revised: 05/22/2014 Document Reviewed: 09/23/2013 Elsevier Interactive Patient Education 2016 [employer].  Alcohol Intoxication Alcohol intoxication occurs when you drink enough alcohol that it affects your ability to function. It can be mild or very severe. Drinking a lot of alcohol in a short time is called binge drinking. This can be very harmful. Drinking alcohol can also be more dangerous if you are taking medicines or other drugs. Some of the effects caused by alcohol may include:  Loss of coordination.  Changes in mood and behavior.  Unclear thinking.  Trouble talking (slurred speech).  Throwing up (vomiting).  Confusion.  Slowed breathing.  Twitching and shaking (seizures).  Loss of consciousness. HOME CARE  Do not drive after drinking alcohol.  Drink enough water and fluids to keep your pee (urine) clear or pale yellow. Avoid caffeine.  Only take medicine as told by your doctor. GET HELP IF:  You throw up (vomit) many times.  You do not  feel better after a few days.  You frequently have alcohol intoxication. Your doctor can help decide if you should see a substance use treatment counselor. GET HELP RIGHT AWAY IF:  You become shaky when you stop drinking.  You have twitching and  shaking.  You throw up blood. It may look bright red or like coffee grounds.  You notice blood in your poop (bowel movements).  You become lightheaded or pass out (faint). MAKE SURE YOU:   Understand these instructions.  Will watch your condition.  Will get help right away if you are not doing well or get worse.   This information is not intended to replace advice given to you by your health care provider. Make sure you discuss any questions you have with your health care provider.   Document Released: 10/18/2007 Document Revised: 01/01/2013 Document Reviewed: 10/04/2012 Elsevier Interactive Patient Education [employer].

## 2015-03-09 ENCOUNTER — Encounter: Payer: Self-pay | Admitting: Pain Medicine

## 2015-03-09 ENCOUNTER — Ambulatory Visit: Payer: 59 | Attending: Pain Medicine | Admitting: Pain Medicine

## 2015-03-09 VITALS — BP 155/98 | HR 124 | Temp 97.7°F | Resp 18 | Ht 69.0 in | Wt 202.0 lb

## 2015-03-09 DIAGNOSIS — M5481 Occipital neuralgia: Secondary | ICD-10-CM | POA: Diagnosis not present

## 2015-03-09 DIAGNOSIS — M179 Osteoarthritis of knee, unspecified: Secondary | ICD-10-CM | POA: Insufficient documentation

## 2015-03-09 DIAGNOSIS — Z79899 Other long term (current) drug therapy: Secondary | ICD-10-CM | POA: Diagnosis not present

## 2015-03-09 DIAGNOSIS — M170 Bilateral primary osteoarthritis of knee: Secondary | ICD-10-CM | POA: Diagnosis present

## 2015-03-09 DIAGNOSIS — M2420 Disorder of ligament, unspecified site: Secondary | ICD-10-CM

## 2015-03-09 DIAGNOSIS — G9050 Complex regional pain syndrome I, unspecified: Secondary | ICD-10-CM | POA: Insufficient documentation

## 2015-03-09 DIAGNOSIS — M1710 Unilateral primary osteoarthritis, unspecified knee: Secondary | ICD-10-CM | POA: Insufficient documentation

## 2015-03-09 NOTE — Progress Notes (Signed)
Subjective:    Patient ID: [name], female    DOB: 1955-08-03, 59 y.o.   MRN: [id number]  HPI Patient is 59 year old female who comes to pain management Center at the request of Dr. Farrel Conners for further evaluation and treatment of pain involving the left side of the neck and face. The patient states that she had pain involving the region for 2 years and finally was able to undergo surgery in June 2016 after obtaining insurance. 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 The patient states that her procedure was performed by Dr. Lucia Gaskins. Patient states that she was without any improvement of her pain following the procedure. Patient states that the pain and discomfort is a sensation of popcorn be instructed in the throat. The patient stated that she has tried to remove the irritating sensation by taking a toothbrush and was unsuccessful in decreasing the uncomfortable sensation in the throat. The patient states that the pain is a sharp sensation awakening her from sleep and interferes with ability to go to sleep. States that the pain is present all the time and is worse since surgery. Patient also admitted to pain involving the region of the left knee which also causes patient significant discomfort throughout the day. We discussed patient's overall condition and after discussion of patient's condition of the face we informed patient would prefer to avoid interventional treatment. We did discuss treatment options including prolotherapy We informed patient that we refer to have patient evaluated by tertiary pain clinic to consider prolotherapy. We offered patient treatment for her pain involving the knee including geniculate nerve blocks as well as intra-articular injections and other treatment. We discussed other treatment for the patient's Eagle's syndrome and informed patient that we have prefer to avoid injections for this condition at this time. We also  discussed psych evaluation and recommended patient consider psych evaluation. The patient stated that she had already seen to many doctors and was not willing to see another doctor at this time. The patient stated that she had lost her job just recently and that she needed immediate treatment of her condition. After evaluation of patient and during process of preparing patient for discharge the patient left the clinic without registering for a return appointment. We will remain available to offer treatment for the patient should patient decide what to return      Review of Systems     Cardiovascular Unremarkable  Pulmonary Unremarkable  Neurological Unremarkable  Psychological Unremarkable  Gastrointestinal Unremarkable  Genitourinary Unremarkable  Hematological Unremarkable  Endocrine Unremarkable  Rheumatological Unremarkable  Musculoskeletal Unremarkable  Of the significant Unremarkable        Objective:   Physical Exam  There was tenderness to palpation of the splenius capitis and occipitalis musculature regions of mild to moderate degree. No new lesions of the head and neck were noted. There was tenderness to palpation of the mandibular region and submandibular region. There was no evidence of masses of the cervical region noted. There was tenderness to palpation of the left submandibular region and cervical region. There was minimal tends to palpation of the temporomandibular joint region. Aperture of the mouth appeared to be within normal limits. There was tends to palpation over the cervical facet cervical paraspinal musculature region of mild degree. Palpation over the thoracic facet thoracic paraspinal musculature region was a tends to palpation of mild degree patient appeared to be with bilaterally equal grip strength. Tinel and Phalen's maneuver without increased  pain of significant degree. Palpation over the thoracic facet thoracic paraspinal musculature  region was without tends to palpation of significant degree. Palpation over the lumbar paraspinal muscle lumbar facet region was with mild to moderate discomfort. Lateral bending and rotation and extension and palpation of the lumbar facets reproduced minimal discomfort. Examination of the knee was a tends to palpation of the knee with negative anterior and posterior drawer signs. There was ballottement of the patella with without increased joint laxity noted. There was no increased warmth erythema of the knee noted. There was increased pain with range of motion maneuvers of the knee. Strength was decreased and no definite sensory deficit of dermatomal distribution was detected. There was minimal tenderness of the greater trochanteric region and iliotibial band region. There was negative clonus negative Homans. Abdomen was nontender with no costovertebral angle tenderness noted.    Assessment & Plan:    Eagle's syndrome  Degenerative joint disease of knees  Greater occipital neuralgia with myofascial pain  Complex regional pain syndrome of oropharynx    PLAN   Continue present medications for now  F/U PCP  for evaliation of  BP and general medical  condition  F/U surgical evaluation. Follow-up with Dr. Kathyrn Sheriff as discussed  Surgical evaluation of knee as discussed  Psych evaluation. We discussed psych evaluation and recommended patient psych evaluation. Patient wishes to avoid considering psych evaluation  F/U neurological evaluation. We discussed further neurological evaluation with patient as well  Prolotherapy was discussed with patient. We informed patient with prefer to have patient undergo evaluation at a tertiary pain clinic prior to considering prolotherapy  May consider radiofrequency rhizolysis or intraspinal procedures pending response to present treatment and F/U evaluation   Patient to call Pain Management Center should patient have concerns prior to scheduled return  appointment.  We will remain available to consider patient for further evaluation and treatment should patient return for further evaluation and treatment

## 2015-03-09 NOTE — Progress Notes (Signed)
Safety precautions to be maintained throughout the outpatient stay will include: orient to surroundings, keep bed in low position, maintain call bell within reach at all times, provide assistance with transfer out of bed and ambulation.  Patient walked out of the clinic because she wanted shots in her knee today and has waited 5 weeks to get this appointment; she said " this is enough" and walked out. Dr Primus Bravo is aware of situation.

## 2015-03-09 NOTE — Patient Instructions (Addendum)
PLAN   Continue present medications   Geniculate nerve blocks of knee to be performed at time return appointment  F/U PCP for evaliation of  BP and general medical  condition  F/U surgical evaluation as discussed Follow-up with Dr. Kathyrn Sheriff as needed and we may consider additional surgical evaluations including evaluation of the left knee  F/U neurological evaluation. May consider pending follow-up evaluations  Psych evaluation. As discussed would like to schedule psych evaluation. Please let me know if you wish to proceed with psych evaluation as discussed  May consider radiofrequency rhizolysis or intraspinal procedures pending response to present treatment and F/U evaluation   Patient to call Pain Management Center should patient have concerns prior to scheduled return appointment.

## 2015-03-17 ENCOUNTER — Other Ambulatory Visit: Payer: Self-pay | Admitting: Pain Medicine

## 2015-04-19 ENCOUNTER — Other Ambulatory Visit: Payer: Self-pay | Admitting: Orthopedic Surgery

## 2015-04-19 DIAGNOSIS — M25562 Pain in left knee: Secondary | ICD-10-CM

## 2015-04-19 DIAGNOSIS — M2392 Unspecified internal derangement of left knee: Secondary | ICD-10-CM

## 2015-04-28 DIAGNOSIS — F17200 Nicotine dependence, unspecified, uncomplicated: Secondary | ICD-10-CM | POA: Insufficient documentation

## 2015-05-12 ENCOUNTER — Ambulatory Visit: Payer: 59

## 2017-11-13 ENCOUNTER — Other Ambulatory Visit: Payer: Self-pay

## 2017-11-13 ENCOUNTER — Ambulatory Visit (HOSPITAL_COMMUNITY)
Admission: RE | Admit: 2017-11-13 | Discharge: 2017-11-13 | Disposition: A | Discharge status: Home / Self Care | Payer: Self-pay | Attending: Psychiatry | Admitting: Psychiatry

## 2017-11-13 ENCOUNTER — Encounter (HOSPITAL_COMMUNITY): Payer: Self-pay | Admitting: Student

## 2017-11-13 ENCOUNTER — Emergency Department (HOSPITAL_COMMUNITY)
Admission: EM | Admit: 2017-11-13 | Discharge: 2017-11-14 | Disposition: A | Discharge status: Home / Self Care | Payer: Self-pay | Attending: Emergency Medicine | Admitting: Emergency Medicine

## 2017-11-13 DIAGNOSIS — Z87891 Personal history of nicotine dependence: Secondary | ICD-10-CM | POA: Insufficient documentation

## 2017-11-13 DIAGNOSIS — Z9889 Other specified postprocedural states: Secondary | ICD-10-CM | POA: Insufficient documentation

## 2017-11-13 DIAGNOSIS — Z85118 Personal history of other malignant neoplasm of bronchus and lung: Secondary | ICD-10-CM | POA: Insufficient documentation

## 2017-11-13 DIAGNOSIS — F332 Major depressive disorder, recurrent severe without psychotic features: Secondary | ICD-10-CM | POA: Insufficient documentation

## 2017-11-13 DIAGNOSIS — E876 Hypokalemia: Secondary | ICD-10-CM | POA: Insufficient documentation

## 2017-11-13 DIAGNOSIS — Z9851 Tubal ligation status: Secondary | ICD-10-CM | POA: Insufficient documentation

## 2017-11-13 DIAGNOSIS — R45851 Suicidal ideations: Secondary | ICD-10-CM | POA: Insufficient documentation

## 2017-11-13 DIAGNOSIS — Z79899 Other long term (current) drug therapy: Secondary | ICD-10-CM | POA: Insufficient documentation

## 2017-11-13 DIAGNOSIS — F329 Major depressive disorder, single episode, unspecified: Secondary | ICD-10-CM | POA: Insufficient documentation

## 2017-11-13 DIAGNOSIS — Z882 Allergy status to sulfonamides status: Secondary | ICD-10-CM | POA: Insufficient documentation

## 2017-11-13 DIAGNOSIS — F4321 Adjustment disorder with depressed mood: Secondary | ICD-10-CM | POA: Insufficient documentation

## 2017-11-13 LAB — COMPREHENSIVE METABOLIC PANEL
ALT: 22 U/L (ref 0–44)
AST: 19 U/L (ref 15–41)
Albumin: 3.5 g/dL (ref 3.5–5.0)
Alkaline Phosphatase: 81 U/L (ref 38–126)
Anion gap: 9 (ref 5–15)
BUN: <5 mg/dL — ABNORMAL LOW (ref 6–20)
CHLORIDE: 103 mmol/L (ref 98–111)
CO2: 30 mmol/L (ref 22–32)
CREATININE: 0.54 mg/dL (ref 0.44–1.00)
Calcium: 9 mg/dL (ref 8.9–10.3)
GFR calc non Af Amer: >60 mL/min (ref >60)
Glucose, Bld: 102 mg/dL — ABNORMAL HIGH (ref 70–99)
Potassium: 2.6 mmol/L — CL (ref 3.5–5.1)
SODIUM: 142 mmol/L (ref 135–145)
Total Bilirubin: 0.4 mg/dL (ref 0.3–1.2)
Total Protein: 5.9 g/dL — ABNORMAL LOW (ref 6.5–8.1)

## 2017-11-13 LAB — CBC
HEMATOCRIT: 42.7 % (ref 36.0–46.0)
Hemoglobin: 15.2 g/dL — ABNORMAL HIGH (ref 12.0–15.0)
MCH: 32.3 pg (ref 26.0–34.0)
MCHC: 35.6 g/dL (ref 30.0–36.0)
MCV: 90.7 fL (ref 78.0–100.0)
Platelets: 181 10*3/uL (ref 150–400)
RBC: 4.71 MIL/uL (ref 3.87–5.11)
RDW: 12.7 % (ref 11.5–15.5)
WBC: 5.1 10*3/uL (ref 4.0–10.5)

## 2017-11-13 LAB — I-STAT CHEM 8, ED
CALCIUM ION: 1.19 mmol/L (ref 1.15–1.40)
CREATININE: 0.5 mg/dL (ref 0.44–1.00)
Chloride: 101 mmol/L (ref 98–111)
GLUCOSE: 127 mg/dL — AB (ref 70–99)
HEMATOCRIT: 37 % (ref 36.0–46.0)
HEMOGLOBIN: 12.6 g/dL (ref 12.0–15.0)
Potassium: 3.1 mmol/L — ABNORMAL LOW (ref 3.5–5.1)
Sodium: 144 mmol/L (ref 135–145)
TCO2: 27 mmol/L (ref 22–32)

## 2017-11-13 LAB — PREGNANCY, URINE: Preg Test, Ur: POSITIVE — AB

## 2017-11-13 LAB — RAPID URINE DRUG SCREEN, HOSP PERFORMED
AMPHETAMINES: NOT DETECTED
BENZODIAZEPINES: NOT DETECTED
Cocaine: NOT DETECTED
Opiates: NOT DETECTED
Tetrahydrocannabinol: NOT DETECTED

## 2017-11-13 LAB — I-STAT BETA HCG BLOOD, ED (MC, WL, AP ONLY): HCG, QUANTITATIVE: 9.6 m[IU]/mL — AB (ref <5)

## 2017-11-13 LAB — ETHANOL: Alcohol, Ethyl (B): <10 mg/dL (ref <10)

## 2017-11-13 LAB — MAGNESIUM: Magnesium: 1.9 mg/dL (ref 1.7–2.4)

## 2017-11-13 MED ORDER — SODIUM CHLORIDE 0.9 % IV BOLUS
500.0000 mL | Freq: Once | INTRAVENOUS | Status: AC
  Administered 2017-11-13: 500 mL via INTRAVENOUS

## 2017-11-13 MED ORDER — POTASSIUM CHLORIDE CRYS ER 20 MEQ PO TBCR
40.0000 meq | EXTENDED_RELEASE_TABLET | Freq: Once | ORAL | Status: AC
  Administered 2017-11-13: 40 meq via ORAL
  Filled 2017-11-13: qty 2

## 2017-11-13 MED ORDER — POTASSIUM CHLORIDE CRYS ER 20 MEQ PO TBCR: 40.0000 meq | EXTENDED_RELEASE_TABLET | Freq: Every day | ORAL | 0 refills | Status: DC

## 2017-11-13 MED ORDER — POTASSIUM CHLORIDE 10 MEQ/100ML IV SOLN
10.0000 meq | INTRAVENOUS | Status: AC
  Administered 2017-11-13 (×2): 10 meq via INTRAVENOUS
  Filled 2017-11-13 (×2): qty 100

## 2017-11-13 NOTE — ED Provider Notes (Signed)
[facility] DEPT Provider Note   CSN: [id number] Arrival date & time: 11/13/17  1707     History   Chief Complaint No chief complaint on file.   HPI [name] is a 62 y.o. female with a hx of depression, drug abuse, and lung cancer s/p R upper lobectomy who presents to the ED from behavioral health facility for medical clearance. Patient reports she has had severe depression with intermittent thoughts of self harm, no specific plan or alleviating/aggravating factors reported to me. Patient without other complaints at this time. Here for labs with [facility] staff member. Denies fever, chest pain, dyspnea, or abdominal pain.   HPI  Past Medical History:  Diagnosis Date  . Allergy   . Depression   . Drug abuse    history of, went to rehab 2015  . GERD (gastroesophageal reflux disease)   . Lung cancer ([place])    lung ca dx 11/11- right upper lobe    Patient Active Problem List   Diagnosis Date Noted  . 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  . 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/2015  . Lung cancer ([place]) 04/24/2011    Past Surgical History:  Procedure Laterality Date  . ANKLE FRACTURE SURGERY Left   . LUNG LOBECTOMY Right 2011   RUL removed for lung cancer  . STYLOID PROCESS EXCISION Left 10/16/2014   Procedure: EXCISION LEFT STYLOID PROCESS;  Surgeon: Rozetta Nunnery, MD;  Location: [facility];  Service: ENT;  Laterality: Left;  . TONSILLECTOMY Bilateral 10/16/2014   Procedure: BILATERAL TONSILLECTOMY;  Surgeon: Rozetta Nunnery, MD;  Location: [facility];  Service: ENT;  Laterality: Bilateral;  . TUBAL LIGATION       OB History   None      Home Medications    Prior to Admission medications   Medication Sig Start Date End Date Taking? Authorizing Provider  azithromycin (ZITHROMAX) 200 MG/5ML suspension Take 6.3 mLs (250 mg total) by mouth daily. Patient not taking: Reported on 03/05/2015 10/16/14    Rozetta Nunnery, MD  clonazePAM (KLONOPIN) 0.5 MG tablet Take 0.5 mg by mouth 2 (two) times daily as needed for anxiety.    [provider]  DULoxetine (CYMBALTA) 60 MG capsule Take 1 capsule (60 mg total) by mouth daily. Patient taking differently: Take 30 mg by mouth daily.  03/05/15 03/04/16  Earleen Newport, MD  etodolac (LODINE) 200 MG capsule Take 1 capsule (200 mg total) by mouth every 8 (eight) hours. 03/02/15   Loney Hering, MD  gabapentin (NEURONTIN) 300 MG capsule Take 300 mg by mouth 3 (three) times daily. Take one capsule in the morning. One capsule midday  And take two capsules by mouth every night at bedtime.    [provider]  lidocaine (XYLOCAINE) 2 % solution Use as directed 20 mLs in the mouth or throat every 6 (six) hours as needed for mouth pain. Patient not taking: Reported on 11/17/2014 10/21/14   Loney Hering, MD  nortriptyline (PAMELOR) 10 MG capsule Take 40 mg by mouth at bedtime. Patient is taking 4 capsules at bedtime.    [provider]  omeprazole (PRILOSEC) 40 MG capsule Take 40 mg by mouth daily.    [provider]  pregabalin (LYRICA) 25 MG capsule Take 25 mg by mouth 2 (two) times daily.    [provider]  valACYclovir (VALTREX) 500 MG tablet Take 500 mg by mouth 2 (two) times daily.  [provider]    Family History Family History  Problem Relation Age of Onset  . Cancer Mother   . Cancer Father     Social History Social History   Tobacco Use  . Smoking status: Former Smoker    Last attempt to quit: 05/15/2013    Years since quitting: 4.5  . Tobacco comment: E-sig " pnce in a while"  Substance Use Topics  . Alcohol use: No  . Drug use: No     Allergies   Sulfa antibiotics   Review of Systems Review of Systems  Constitutional: Negative for chills and fever.  Respiratory: Negative for shortness of breath.   Cardiovascular: Negative for chest pain.  Gastrointestinal:  Negative for abdominal pain, diarrhea and vomiting.  Psychiatric/Behavioral: Positive for suicidal ideas. Negative for hallucinations.  All other systems reviewed and are negative.    Physical Exam Updated Vital Signs There were no vitals taken for this visit.  Physical Exam  Constitutional: She appears well-developed and well-nourished. No distress.  HENT:  Head: Normocephalic and atraumatic.  Eyes: Conjunctivae are normal. Right eye exhibits no discharge. Left eye exhibits no discharge.  Cardiovascular: Normal rate and regular rhythm.  No murmur heard. Pulmonary/Chest: Breath sounds normal. No respiratory distress. She has no wheezes. She has no rales.  Abdominal: Soft. She exhibits no distension. There is no tenderness.  Neurological: She is alert.  Clear speech.   Skin: Skin is warm and dry. No rash noted.  Psychiatric: Her behavior is normal. She is not actively hallucinating. She expresses suicidal ideation. She expresses no homicidal ideation.  Nursing note and vitals reviewed.   ED Treatments / Results  Labs Results for orders placed or performed during the hospital encounter of 11/13/17  Comprehensive metabolic panel  Result Value Ref Range   Sodium 142 135 - 145 mmol/L   Potassium 2.6 (LL) 3.5 - 5.1 mmol/L   Chloride 103 98 - 111 mmol/L   CO2 30 22 - 32 mmol/L   Glucose, Bld 102 (H) 70 - 99 mg/dL   BUN <5 (L) 6 - 20 mg/dL   Creatinine, Ser 0.54 0.44 - 1.00 mg/dL   Calcium 9.0 8.9 - 10.3 mg/dL   Total Protein 5.9 (L) 6.5 - 8.1 g/dL   Albumin 3.5 3.5 - 5.0 g/dL   AST 19 15 - 41 U/L   ALT 22 0 - 44 U/L   Alkaline Phosphatase 81 38 - 126 U/L   Total Bilirubin 0.4 0.3 - 1.2 mg/dL   GFR calc non Af Amer >60 >60 mL/min   GFR calc Af Amer >60 >60 mL/min   Anion gap 9 5 - 15  Ethanol  Result Value Ref Range   Alcohol, Ethyl (B) <10 <10 mg/dL  cbc  Result Value Ref Range   WBC 5.1 4.0 - 10.5 K/uL   RBC 4.71 3.87 - 5.11 MIL/uL   Hemoglobin 15.2 (H) 12.0 - 15.0  g/dL   HCT 42.7 36.0 - 46.0 %   MCV 90.7 78.0 - 100.0 fL   MCH 32.3 26.0 - 34.0 pg   MCHC 35.6 30.0 - 36.0 g/dL   RDW 12.7 11.5 - 15.5 %   Platelets 181 150 - 400 K/uL  Rapid urine drug screen (hospital performed)  Result Value Ref Range   Opiates NONE DETECTED NONE DETECTED   Cocaine NONE DETECTED NONE DETECTED   Benzodiazepines NONE DETECTED NONE DETECTED   Amphetamines NONE DETECTED NONE DETECTED   Tetrahydrocannabinol NONE DETECTED NONE DETECTED  Barbiturates (A) NONE DETECTED    Result not available. Reagent lot number recalled by manufacturer.  Pregnancy, urine  Result Value Ref Range   Preg Test, Ur POSITIVE (A) NEGATIVE  Magnesium  Result Value Ref Range   Magnesium 1.9 1.7 - 2.4 mg/dL  I-Stat beta hCG blood, ED  Result Value Ref Range   I-stat hCG, quantitative 9.6 (H) <5 mIU/mL   Comment 3          I-stat Chem 8, ED  Result Value Ref Range   Sodium 144 135 - 145 mmol/L   Potassium 3.1 (L) 3.5 - 5.1 mmol/L   Chloride 101 98 - 111 mmol/L   BUN <3 (L) 6 - 20 mg/dL   Creatinine, Ser 0.50 0.44 - 1.00 mg/dL   Glucose, Bld 127 (H) 70 - 99 mg/dL   Calcium, Ion 1.19 1.15 - 1.40 mmol/L   TCO2 27 22 - 32 mmol/L   Hemoglobin 12.6 12.0 - 15.0 g/dL   HCT 37.0 36.0 - 46.0 %   No results found. EKG EKG Interpretation  Date/Time:  Tuesday November 13 2017 19:51:30 EDT Ventricular Rate:  65 PR Interval:    QRS Duration: 101 QT Interval:  385 QTC Calculation: 401 R Axis:   19 Text Interpretation:  Sinus rhythm Borderline T abnormalities, anterior leads Confirmed by Julianne Rice [phone]) on 11/13/2017 11:30:06 PM   Radiology No results found.  Procedures Procedures (including critical care time)  Medications Ordered in ED Medications - No data to display   Initial Impression / Assessment and Plan / ED Course  I have reviewed the triage vital signs and the nursing notes.  Pertinent labs & imaging results that were available during my care of the patient were  reviewed by me and considered in my medical decision making (see chart for details).   Patient presents to the emergency department for depression and suicidal thoughts requiring medical clearance.  Patient nontoxic-appearing, no apparent distress, vitals WNL.  Benign exam. Labs reviewed:  Patient with slightly elevated beta hCG and positive upreg- discussed with Dr. Lita Mains given age and tubal ligation, he feels this is not possible and is false, I agree, discussed with patient who has absolutely no concern for this.  Patient's potassium is critically low at 2.6- will check magnesium, obtain EKG, and initiate IV and PO replacement and recheck.  EKG QTc 401. Magnesium WNL.   23:00: Repeat potassium 3.1. Will place patient on a short course of oral potassium.   Patient medically cleared for [facility]. RN is arranging transfer back to [facility] facility.   Findings and plan of care discussed with supervising physician Dr.Yelverton who is in agreement with plan.   Final Clinical Impressions(s) / ED Diagnoses   Final diagnoses:  Suicidal ideation  Hypokalemia    ED Discharge Orders        Ordered    potassium chloride SA (K-DUR,KLOR-CON) 20 MEQ tablet  Daily     11/13/17 [address] [place], PA-C 11/14/17 0010    Julianne Rice, MD 11/15/17 [phone]

## 2017-11-13 NOTE — ED Notes (Signed)
Coming from [facility] lab work and then will be returning to [facility]

## 2017-11-13 NOTE — ED Notes (Signed)
Bed: [facility] Expected date:  Expected time:  Means of arrival:  Comments: 

## 2017-11-13 NOTE — ED Notes (Signed)
Spoke with Amy from lab, will add on Magnesium to existing blood work.

## 2017-11-13 NOTE — BH Assessment (Signed)
Assessment Note  [name] is an 62 y.o. female present to [facility] unaccompanied with complaints of depression and suicidal ideations with no plan. Patient report worsening depressing with worsening suicidal thoughts past 90 days. Patient stated she stopped taking her medication cause she hated the way it made her feel. Patient report suicidal thoughts so bad she's scared to be alone. Patient has mental health history of Bipolar D/O, Adjustment D/O with depressed mood and Alcohol Intoxication. Patient denies substance use. Patient denies homicidal ideations, denies auditory / visual hallucinations.   Patient fidgetty and present with psychomotor agitation. Report decreased sleep with only 3 hours sleep per night. Decreased appetite, and unsure if she has lost weight. Patient report suicidal thoughts are so bad she's scared to be alone. Patient has history of mental health d/o. Patient speech was pleasant. Patient judgement impaired as evidenced by increased suicidal thoughts with depression. Patient presents a danger to herself due to unpredictable behavior and risk for suicide due to negative intrusive thoughts. Patient is responding to internal stimuli.    Diagnosis:  F33.2   Major depressive disorder, Recurrent episode, Severe  Past Medical History:  Past Medical History:  Diagnosis Date  . Allergy   . Depression   . Drug abuse    history of, went to rehab 2015  . GERD (gastroesophageal reflux disease)   . Lung cancer ([place])    lung ca dx 11/11- right upper lobe    Past Surgical History:  Procedure Laterality Date  . ANKLE FRACTURE SURGERY Left   . LUNG LOBECTOMY Right 2011   RUL removed for lung cancer  . STYLOID PROCESS EXCISION Left 10/16/2014   Procedure: EXCISION LEFT STYLOID PROCESS;  Surgeon: Rozetta Nunnery, MD;  Location: [place];  Service: ENT;  Laterality: Left;  . TONSILLECTOMY Bilateral 10/16/2014   Procedure: BILATERAL TONSILLECTOMY;  Surgeon:  Rozetta Nunnery, MD;  Location: [place];  Service: ENT;  Laterality: Bilateral;  . TUBAL LIGATION      Family History:  Family History  Problem Relation Age of Onset  . Cancer Mother   . Cancer Father     Social History:  reports that she quit smoking about 4 years ago. She does not have any smokeless tobacco history on file. She reports that she does not drink alcohol or use drugs.  Additional Social History:  Alcohol / Drug Use Pain Medications: see MAR Prescriptions: see MAR Over the Counter: see MAR History of alcohol / drug use?: (patient denies)  CIWA:   COWS:    Allergies:  Allergies  Allergen Reactions  . Sulfa Antibiotics Other (See Comments)    Reaction: unknown    Home Medications:  (Not in a hospital admission)  OB/GYN Status:  No LMP recorded. Patient is postmenopausal.  General Assessment Data Location of Assessment: [facility] Services(walk-in) TTS Assessment: In system Is this a Tele or Face-to-Face Assessment?: Face-to-Face Is this an Initial Assessment or a Re-assessment for this encounter?: Initial Assessment Marital status: Single Living Arrangements: Alone Can pt return to current living arrangement?: Yes Admission Status: Voluntary Is patient capable of signing voluntary admission?: Yes Referral Source: Self/Family/Friend Insurance type: self pay  Medical Screening Exam ([place]) Medical Exam completed: Yes  Crisis Care Plan Living Arrangements: Alone Legal Guardian: Other:(self) Name of Psychiatrist: Chinita Pester - pt report has not been in 3 months  Name of Therapist: pt denie   Education Status Is patient currently in school?: No Is the patient  employed, unemployed or receiving disability?: Unemployed  Risk to self with the past 6 months Suicidal Ideation: Yes-Currently Present Has patient been a risk to self within the past 6 months prior to admission? : No Suicidal Intent: No Has patient had any  suicidal intent within the past 6 months prior to admission? : No Is patient at risk for suicide?: Yes(pt report she scared to be alone) Suicidal Plan?: No Has patient had any suicidal plan within the past 6 months prior to admission? : No Access to Means: No What has been your use of drugs/alcohol within the last 12 months?: pt denies Previous Attempts/Gestures: No How many times?: 0 Other Self Harm Risks: pt denies  Triggers for Past Attempts: None known Intentional Self Injurious Behavior: None Family Suicide History: No Recent stressful life event(s): Other (Comment)(pt stop taking her medication ) Persecutory voices/beliefs?: No Depression: Yes Depression Symptoms: Loss of interest in usual pleasures Substance abuse history and/or treatment for substance abuse?: No Suicide prevention information given to non-admitted patients: Not applicable  Risk to Others within the past 6 months Homicidal Ideation: No Does patient have any lifetime risk of violence toward others beyond the six months prior to admission? : No Thoughts of Harm to Others: No Current Homicidal Intent: No Current Homicidal Plan: No Access to Homicidal Means: No Identified Victim: n/a History of harm to others?: No Assessment of Violence: None Noted Violent Behavior Description: None Noted Does patient have access to weapons?: No Criminal Charges Pending?: No Does patient have a court date: No Is patient on probation?: No  Psychosis Hallucinations: None noted Delusions: None noted  Mental Status Report Appearance/Hygiene: Other (Comment)(pt dressed for weather) Eye Contact: Fair Motor Activity: Freedom of movement Speech: Logical/coherent Level of Consciousness: Alert Mood: Other (Comment)(constricted) Affect: Flat Anxiety Level: None Thought Processes: Coherent, Relevant Judgement: Impaired Orientation: Person, Place, Time, Situation Obsessive Compulsive Thoughts/Behaviors: None  Cognitive  Functioning Concentration: Normal Memory: Recent Intact, Remote Intact Is patient IDD: No Is patient DD?: No Insight: Poor Impulse Control: Fair Appetite: Poor Have you had any weight changes? : No Change Sleep: Decreased Total Hours of Sleep: 3 Vegetative Symptoms: None  ADLScreening [facility]) Patient's cognitive ability adequate to safely complete daily activities?: Yes Patient able to express need for assistance with ADLs?: Yes Independently performs ADLs?: Yes (appropriate for developmental age)  Prior Inpatient Therapy Prior Inpatient Therapy: No  Prior Outpatient Therapy Prior Outpatient Therapy: No Does patient have an ACCT team?: No Does patient have Intensive In-House Services?  : No Does patient have [facility] services? : No Does patient have P4CC services?: No  ADL Screening (condition at time of admission) Patient's cognitive ability adequate to safely complete daily activities?: Yes Is the patient deaf or have difficulty hearing?: No Does the patient have difficulty seeing, even when wearing glasses/contacts?: No Does the patient have difficulty concentrating, remembering, or making decisions?: No Patient able to express need for assistance with ADLs?: Yes Does the patient have difficulty dressing or bathing?: No Independently performs ADLs?: Yes (appropriate for developmental age) Does the patient have difficulty walking or climbing stairs?: No       Abuse/Neglect Assessment (Assessment to be complete while patient is alone) Abuse/Neglect Assessment Can Be Completed: Yes Physical Abuse: Denies Verbal Abuse: Yes, past (Comment) Sexual Abuse: Denies Exploitation of patient/patient's resources: Denies Self-Neglect: Denies     Regulatory affairs officer (For Healthcare) Does Patient Have a Medical Advance Directive?: No Would patient like information on creating a medical advance directive?: No -  Patient declined    Additional Information 1:1 In  Past 12 Months?: No CIRT Risk: No Elopement Risk: No Does patient have medical clearance?: No     Disposition:  Disposition Initial Assessment Completed for this Encounter: Salem Senate, NP, recommend inpt tx ) Disposition of Patient: Admit(Takia Starkes, NP, patient accepted to [facility]) Type of inpatient treatment program: Adult Patient refused recommended treatment: No  On Site Evaluation by:   Reviewed with Physician:    Despina Hidden 11/13/2017 5:02 PM

## 2017-11-13 NOTE — ED Notes (Signed)
Date and time results received: 11/13/17 19:21  Test: Potassium  Critical Value: 2.6  Name of Provider Notified: Notified S. Petrucelli, PA via telephone at 19:22. Notified primary nurse via departmental radio at 19:22.   Orders Received? Or Actions Taken?: Will continue to monitor patient and await new orders.

## 2017-11-13 NOTE — Discharge Instructions (Addendum)
You were seen in the emergency department today for medical clearance.  Your potassium was low, we replaced this in the emergency department.  It is still somewhat low at 3.1 therefore we are sending you home with a few days of potassium supplements, please take these as prescribed.  Please eat a normal diet.  Have been medically cleared to return to behavioral health.  Return to the ER anytime for new or worsening symptoms or any other concerns.

## 2017-11-13 NOTE — ED Notes (Signed)
EKG completed and handed to [place].

## 2017-11-13 NOTE — ED Triage Notes (Signed)
Patient here from [facility] with complaints of depression for the past year. States that she does not know the cause of her depression. States that she takes Depakote and no longer take clonazepam. Denies SI/HI

## 2017-11-13 NOTE — ED Notes (Signed)
[place] sandwich given to pt.

## 2017-11-14 ENCOUNTER — Inpatient Hospital Stay (HOSPITAL_COMMUNITY)
Admission: AD | Admit: 2017-11-14 | Discharge: 2017-11-28 | DRG: 885 | Disposition: A | Discharge status: Home / Self Care | Payer: Federal, State, Local not specified - Other | Source: Intra-hospital | Attending: Psychiatry | Admitting: Psychiatry

## 2017-11-14 ENCOUNTER — Encounter (HOSPITAL_COMMUNITY): Payer: Self-pay

## 2017-11-14 DIAGNOSIS — M1710 Unilateral primary osteoarthritis, unspecified knee: Secondary | ICD-10-CM | POA: Diagnosis present

## 2017-11-14 DIAGNOSIS — I959 Hypotension, unspecified: Secondary | ICD-10-CM | POA: Diagnosis not present

## 2017-11-14 DIAGNOSIS — R45851 Suicidal ideations: Secondary | ICD-10-CM | POA: Diagnosis present

## 2017-11-14 DIAGNOSIS — F314 Bipolar disorder, current episode depressed, severe, without psychotic features: Secondary | ICD-10-CM | POA: Diagnosis not present

## 2017-11-14 DIAGNOSIS — Z902 Acquired absence of lung [part of]: Secondary | ICD-10-CM | POA: Diagnosis not present

## 2017-11-14 DIAGNOSIS — G2581 Restless legs syndrome: Secondary | ICD-10-CM | POA: Diagnosis present

## 2017-11-14 DIAGNOSIS — K219 Gastro-esophageal reflux disease without esophagitis: Secondary | ICD-10-CM | POA: Diagnosis present

## 2017-11-14 DIAGNOSIS — Z809 Family history of malignant neoplasm, unspecified: Secondary | ICD-10-CM

## 2017-11-14 DIAGNOSIS — F419 Anxiety disorder, unspecified: Secondary | ICD-10-CM | POA: Diagnosis not present

## 2017-11-14 DIAGNOSIS — Z87891 Personal history of nicotine dependence: Secondary | ICD-10-CM

## 2017-11-14 DIAGNOSIS — G4700 Insomnia, unspecified: Secondary | ICD-10-CM | POA: Diagnosis present

## 2017-11-14 DIAGNOSIS — Z85118 Personal history of other malignant neoplasm of bronchus and lung: Secondary | ICD-10-CM

## 2017-11-14 DIAGNOSIS — F332 Major depressive disorder, recurrent severe without psychotic features: Principal | ICD-10-CM | POA: Diagnosis present

## 2017-11-14 LAB — LIPID PANEL
CHOL/HDL RATIO: 6.1 ratio
Cholesterol: 178 mg/dL (ref 0–200)
HDL: 29 mg/dL — ABNORMAL LOW (ref >40)
LDL Cholesterol: 106 mg/dL — ABNORMAL HIGH (ref 0–99)
Triglycerides: 215 mg/dL — ABNORMAL HIGH (ref <150)
VLDL: 43 mg/dL — ABNORMAL HIGH (ref 0–40)

## 2017-11-14 LAB — TSH: TSH: 2.025 u[IU]/mL (ref 0.350–4.500)

## 2017-11-14 LAB — HEMOGLOBIN A1C
HEMOGLOBIN A1C: 5.1 % (ref 4.8–5.6)
MEAN PLASMA GLUCOSE: 99.67 mg/dL

## 2017-11-14 MED ORDER — ALUM & MAG HYDROXIDE-SIMETH 200-200-20 MG/5ML PO SUSP: 30.0000 mL | ORAL | Status: DC | PRN

## 2017-11-14 MED ORDER — QUETIAPINE FUMARATE 50 MG PO TABS
50.0000 mg | ORAL_TABLET | Freq: Every day | ORAL | Status: DC
  Administered 2017-11-14: 50 mg via ORAL
  Filled 2017-11-14 (×2): qty 1

## 2017-11-14 MED ORDER — TRAZODONE HCL 150 MG PO TABS
300.0000 mg | ORAL_TABLET | Freq: Every day | ORAL | Status: DC
  Administered 2017-11-14: 300 mg via ORAL
  Filled 2017-11-14 (×4): qty 2

## 2017-11-14 MED ORDER — ACETAMINOPHEN 325 MG PO TABS
650.0000 mg | ORAL_TABLET | Freq: Four times a day (QID) | ORAL | Status: DC | PRN
  Administered 2017-11-14 – 2017-11-27 (×8): 650 mg via ORAL
  Filled 2017-11-14 (×8): qty 2

## 2017-11-14 MED ORDER — MAGNESIUM HYDROXIDE 400 MG/5ML PO SUSP: 30.0000 mL | Freq: Every day | ORAL | Status: DC | PRN

## 2017-11-14 MED ORDER — POTASSIUM CHLORIDE CRYS ER 20 MEQ PO TBCR
40.0000 meq | EXTENDED_RELEASE_TABLET | Freq: Every day | ORAL | Status: DC
  Administered 2017-11-14 – 2017-11-23 (×10): 40 meq via ORAL
  Filled 2017-11-14 (×12): qty 2

## 2017-11-14 MED ORDER — DIVALPROEX SODIUM ER 500 MG PO TB24
1000.0000 mg | ORAL_TABLET | Freq: Every day | ORAL | Status: DC
  Administered 2017-11-14 – 2017-11-25 (×12): 1000 mg via ORAL
  Filled 2017-11-14 (×14): qty 2

## 2017-11-14 NOTE — Plan of Care (Signed)
Problem: Education: Goal: Emotional status will improve Outcome: Not Progressing  Problem: Safety: Goal: Periods of time without injury will increase Outcome: Progressing   Problem: Medication: Goal: Compliance with prescribed medication regimen will improve Outcome: Progressing DAR NOTE: Patient presents with anxious affect and depressed mood.  Denies suicidal thoughts, auditory and visual hallucinations.  Rates depression at 10, hopelessness at 10, and anxiety at 10.  Maintained on routine safety checks.  Medications given as prescribed.  Support and encouragement offered as needed.  Attended group and participated.  States goal for today is "feeling better."  Patient visible in milieu with minimal interactions.  Patient appears guarded and preoccupied.  Offered no complaint.

## 2017-11-14 NOTE — Progress Notes (Addendum)
Admission Note:  [name] is an 62 y.o. female present to [facility] with c/o of worsening depression and suicidal ideation with no plan. Pt is able to verbal contract for safety. Pt was depressed/irritable in affect and mood but was cooperative during the admission process. Patient states she takes Depakote 500 twice daily and  Trazodone 300 at bedtime. Pt denies HI/AVH/Pain at this time. Pt denies drug/alcohol/tobacco-use.  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 Pt states she lives alone and states sister is primary support. Pt was guarded on assessment. Pt did not go into details with Probation officer. Pt reports she wanted to go to sleep. Skin was assessed and found to be clear of any abnormal marks. Pt searched and no contraband found, POC and unit policies explained and understanding verbalized. Consents obtained. Food and fluids offered, and both accepted. While at [facility], [name] states she would like to work on 1)"Feeling better" and 2)"Medication adjustment".No belongings PTA.Pt had no additional questions or concerns.

## 2017-11-14 NOTE — Therapy (Signed)
Occupational Therapy Group Treatment Note  Date:  11/14/2017 Time:  3:06 PM  Group Topic/Focus:  Stress Management  Participation Level:  Active  Participation Quality:  Appropriate  Affect:  Depressed and Flat  Cognitive:  Appropriate  Insight: Improving  Engagement in Group:  Engaged  Modes of Intervention:  Activity, Discussion, Education and Socialization  Additional Comments:    S: O: Stress management group completed to use as productive coping strategy, to help mitigate maladaptive coping to integrate in functional BADL/IADL. Stress management tool worksheet discussed to educate on unhealthy vs healthy coping skills to manage stress to improve community integration. Coping strategies taught include: relaxation based- deep breathing, counting to 10, taking a 1 minute vacation, acceptance, stress balls, relaxation audio/video, visual/mental imagery. Positive mental attitude- gratitude, acceptance, cognitive reframing, positive self talk, anger management. Coping skills bingo played with education given on variety of coping skills between bingo calls. Pts encouraged to share experience with various coping skills and share what has worked for them with others. Coloring and relaxation guide handouts given at the end of the session.   A: Pt presents to group with flat affect. Pt was engaged with minimal verbal participation throughout session. Stress management tools worksheet completed, pt nodding that she suppresses her emotions and will occasionally act out. Pt would like to try relaxation and positive mental attitude strategies this date. Pt engaged in coping skills bingo, sharing coping skills and preferences with other group members. Pt acquired handouts at end of session.  P: Pt provided with education on stress management activities to implement into daily routine. Handouts given to facilitate carryover when reintegrating into community    [facility], [place], OTR/L  [employer] 11/14/2017, 3:06 PM

## 2017-11-14 NOTE — Tx Team (Signed)
Initial Treatment Plan 11/14/2017 1:39 AM [name] [id number]    PATIENT STRESSORS: Financial difficulties Medication change or noncompliance   PATIENT STRENGTHS: Capable of independent living Communication skills General fund of knowledge Motivation for treatment/growth Work skills   PATIENT IDENTIFIED PROBLEMS: Depression   "Not sleeping"   "Medication adjustment"   Decreased appetite                DISCHARGE CRITERIA:  Ability to meet basic life and health needs Improved stabilization in mood, thinking, and/or behavior Verbal commitment to aftercare and medication compliance  PRELIMINARY DISCHARGE PLAN: Attend PHP/IOP Outpatient therapy Return to previous living arrangement Return to previous work or school arrangements  PATIENT/FAMILY INVOLVEMENT: This treatment plan has been presented to and reviewed with the patient, [name].The patient have been given the opportunity to ask questions and make suggestions.  Lonia Skinner, RN 11/14/2017, 1:39 AM

## 2017-11-14 NOTE — H&P (Signed)
Psychiatric Admission Assessment Adult  Patient Identification: [name] MRN:  [id number] Date of Evaluation:  11/14/2017 Chief Complaint:  mdd Principal Diagnosis: <principal problem not specified> Diagnosis:   Patient Active Problem List   Diagnosis Date Noted  . MDD (major depressive disorder), recurrent severe, without psychosis ([place]) [F33.2] 11/14/2017  . 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 Lung cancer [facility]) [C34.90] 04/24/2011   History of Present Illness: Patient is seen and examined.  Patient is a 62 year old female with a probable past psychiatric history significant for bipolar disorder, alcohol use disorder and depression who presented to the behavioral health hospital yesterday for admission.  She was unaccompanied.  Patient stated that she had worsening depression and worsening suicidal ideation over the last 3 months.  The patient stated she had stopped taking her current medications because she did not like the way it made her feel.  [facility] but was last [facility] over 6 months ago.  Her last psychiatric hospitalization was at [facility] prior to that.  She does not recall what medication she was taking at that time.  Review of the electronic medical record showed that is least as far back as 2013 she had been prescribed fluoxetine, Seroquel, Valium and BuSpar.  The patient stated that she had not had any alcohol in 3 to 6 months.  She stated she was not sleeping.  She reported decreased appetite, helplessness, hopelessness and worthlessness.  She did admit to a history of manic episodes in the past.  She recalled that at least 1 of the antidepressant medicines in the past led to decreased sleep, increased goal-directed activity as well as euphoria.  She was admitted to the hospital for evaluation and stabilization. Associated Signs/Symptoms: Depression Symptoms:  depressed  mood, anhedonia, insomnia, psychomotor agitation, fatigue, feelings of worthlessness/guilt, difficulty concentrating, hopelessness, suicidal thoughts without plan, anxiety, loss of energy/fatigue, disturbed sleep, (Hypo) Manic Symptoms:  Impulsivity, Irritable Mood, Anxiety Symptoms:  Excessive Worry, Psychotic Symptoms:  Denied PTSD Symptoms: Negative Total Time spent with patient: 1 hour  Past Psychiatric History: Patient admitted to several psychiatric hospitalizations.  The last was in 2018 at [facility].  She was unable to recall any of her medications she had taken in the past.  Is the patient at risk to self? Yes.    Has the patient been a risk to self in the past 6 months? Yes.    Has the patient been a risk to self within the distant past? No.  Is the patient a risk to others? No.  Has the patient been a risk to others in the past 6 months? No.  Has the patient been a risk to others within the distant past? No.   Prior Inpatient Therapy:   Prior Outpatient Therapy:    Alcohol Screening: 1. How often do you have a drink containing alcohol?: Never 2. How many drinks containing alcohol do you have on a typical day when you are drinking?: 1 or 2 3. How often do you have six or more drinks on one occasion?: Never AUDIT-C Score: 0 4. How often during the last year have you found that you were not able to stop drinking once you had started?: Never 5. How often during the last year have you failed to do what was normally expected from you becasue of drinking?: Never 6. How often during the last year have you needed a first drink in  the morning to get yourself going after a heavy drinking session?: Never 7. How often during the last year have you had a feeling of guilt of remorse after drinking?: Never 8. How often during the last year have you been unable to remember what happened the night before because you had been drinking?: Never 9. Have you or someone else been  injured as a result of your drinking?: No 10. Has a relative or friend or a doctor or another health worker been concerned about your drinking or suggested you cut down?: No Alcohol Use Disorder Identification Test Final Score (AUDIT): 0 Substance Abuse History in the last 12 months:  No. Consequences of Substance Abuse: Negative Previous Psychotropic Medications: Yes  Psychological Evaluations: Yes  Past Medical History:  Past Medical History:  Diagnosis Date  . Allergy   . Depression   . Drug abuse ([place])    history of, went to rehab 2015  . GERD (gastroesophageal reflux disease)   . Lung cancer ([place])    lung ca dx 11/11- right upper lobe    Past Surgical History:  Procedure Laterality Date  . ANKLE FRACTURE SURGERY Left   . LUNG LOBECTOMY Right 2011   RUL removed for lung cancer  . STYLOID PROCESS EXCISION Left 10/16/2014   Procedure: EXCISION LEFT STYLOID PROCESS;  Surgeon: Rozetta Nunnery, MD;  Location: [facility];  Service: ENT;  Laterality: Left;  . TONSILLECTOMY Bilateral 10/16/2014   Procedure: BILATERAL TONSILLECTOMY;  Surgeon: Rozetta Nunnery, MD;  Location: [facility];  Service: ENT;  Laterality: Bilateral;  . TUBAL LIGATION     Family History:  Family History  Problem Relation Age of Onset  . Cancer Mother   . Cancer Father    Family Psychiatric  History: Denied Tobacco Screening: Have you used any form of tobacco in the last 30 days? (Cigarettes, Smokeless Tobacco, Cigars, and/or Pipes): No Social History:  Social History   Substance and Sexual Activity  Alcohol Use No     Social History   Substance and Sexual Activity  Drug Use No    Additional Social History: Marital status: Single Are you sexually active?: No What is your sexual orientation?: Heterosexual  Has your sexual activity been affected by drugs, alcohol, medication, or emotional stress?: No Does patient have children?: Yes How many children?: 1 How  is patient's relationship with their children?: Patient reports she has a good relationship with her only son.                          Allergies:   Allergies  Allergen Reactions  . Sulfa Antibiotics Rash   Lab Results:  Results for orders placed or performed during the hospital encounter of 11/14/17 (from the past 48 hour(s))  Lipid panel     Status: Abnormal   Collection Time: 11/14/17  6:52 AM  Result Value Ref Range   Cholesterol 178 0 - 200 mg/dL   Triglycerides 215 (H) <150 mg/dL   HDL 29 (L) >40 mg/dL   Total CHOL/HDL Ratio 6.1 RATIO   VLDL 43 (H) 0 - 40 mg/dL   LDL Cholesterol 106 (H) 0 - 99 mg/dL    Comment:        Total Cholesterol/HDL:CHD Risk Coronary Heart Disease Risk Table                     Men   Women  1/2 Average Risk  3.4   3.3  Average Risk       5.0   4.4  2 X Average Risk   9.6   7.1  3 X Average Risk  23.4   11.0        Use the calculated Patient Ratio above and the CHD Risk Table to determine the patient's CHD Risk.        ATP III CLASSIFICATION (LDL):  <100     mg/dL   Optimal  100-129  mg/dL   Near or Above                    Optimal  130-159  mg/dL   Borderline  160-189  mg/dL   High  >190     mg/dL   Very High Performed at [facility] [address], [place], [place] [address]   Hemoglobin A1c     Status: None   Collection Time: 11/14/17  6:52 AM  Result Value Ref Range   Hgb A1c MFr Bld 5.1 4.8 - 5.6 %    Comment: (NOTE) Pre diabetes:          5.7%-6.4% Diabetes:              >6.4% Glycemic control for   <7.0% adults with diabetes    Mean Plasma Glucose 99.67 mg/dL    Comment: Performed at [facility] [address], [place], [place] [address]  TSH     Status: None   Collection Time: 11/14/17  6:52 AM  Result Value Ref Range   TSH 2.025 0.350 - 4.500 uIU/mL    Comment: Performed by a 3rd Generation assay with a functional sensitivity of <=0.01 uIU/mL. Performed at [facility], [place] [address] [place], [place] [address]     Blood Alcohol level:  Lab Results  Component Value Date   ETH <10 11/13/2017   ETH 179 (H) 65/99/3570    Metabolic Disorder Labs:  Lab Results  Component Value Date   HGBA1C 5.1 11/14/2017   MPG 99.67 11/14/2017   No results found for: PROLACTIN Lab Results  Component Value Date   CHOL 178 11/14/2017   TRIG 215 (H) 11/14/2017   HDL 29 (L) 11/14/2017   CHOLHDL 6.1 11/14/2017   VLDL 43 (H) 11/14/2017   LDLCALC 106 (H) 11/14/2017    Current Medications: Current Facility-Administered Medications  Medication Dose Route Frequency Provider Last Rate Last Dose  . acetaminophen (TYLENOL) tablet 650 mg  650 mg Oral Q6H PRN Nanci Pina, FNP      . alum & mag hydroxide-simeth (MAALOX/MYLANTA) 200-200-20 MG/5ML suspension 30 mL  30 mL Oral Q4H PRN Lavina Hamman, Takia S, FNP      . divalproex (DEPAKOTE ER) 24 hr tablet 1,000 mg  1,000 mg Oral Daily Lindon Romp A, NP   1,000 mg at 11/14/17 0819  . magnesium hydroxide (MILK OF MAGNESIA) suspension 30 mL  30 mL Oral Daily PRN Nanci Pina, FNP      . potassium chloride SA (K-DUR,KLOR-CON) CR tablet 40 mEq  40 mEq Oral Daily Lindon Romp A, NP   40 mEq at 11/14/17 0819  . QUEtiapine (SEROQUEL) tablet 50 mg  50 mg Oral QHS Sharma Covert, MD      . traZODone (DESYREL) tablet 300 mg  300 mg Oral QHS Lindon Romp A, NP   300 mg at 11/14/17 0202   PTA Medications: Medications Prior to Admission  Medication Sig  Dispense Refill Last Dose  . trazodone (DESYREL) 300 MG tablet Take 300 mg by mouth at bedtime.     . divalproex (DEPAKOTE ER) 500 MG 24 hr tablet Take 1,000 mg by mouth daily.  1 11/13/2017 at Unknown time  . potassium chloride SA (K-DUR,KLOR-CON) 20 MEQ tablet Take 2 tablets (40 mEq total) by mouth daily. 6 tablet 0   . venlafaxine XR (EFFEXOR-XR) 37.5 MG 24 hr capsule Take 37.5 mg by mouth daily.  1 11/13/2017 at Unknown time    Musculoskeletal: Strength & Muscle Tone:  within normal limits Gait & Station: normal Patient leans: N/A  Psychiatric Specialty Exam: Physical Exam  Nursing note and vitals reviewed. Constitutional: She is oriented to person, place, and time. She appears well-developed and well-nourished.  HENT:  Head: Normocephalic and atraumatic.  Respiratory: Effort normal.  Neurological: She is alert and oriented to person, place, and time.    ROS  Blood pressure (!) 84/67, pulse (!) 105, temperature (!) 97.5 F (36.4 C), temperature source Oral, resp. rate 16, height 5\' 8"  (1.727 m), weight 71.2 kg (157 lb), SpO2 96 %.Body mass index is 23.87 kg/m.  General Appearance: Disheveled  Eye Contact:  Fair  Speech:  Normal Rate  Volume:  Decreased  Mood:  Anxious, Depressed and Irritable  Affect:  Congruent  Thought Process:  Coherent  Orientation:  Full (Time, Place, and Person)  Thought Content:  Logical  Suicidal Thoughts:  Yes.  without intent/plan  Homicidal Thoughts:  No  Memory:  Immediate;   Poor Recent;   Poor Remote;   Poor  Judgement:  Impaired  Insight:  Lacking  Psychomotor Activity:  Increased  Concentration:  Concentration: Fair and Attention Span: Fair  Recall:  [employer] of Knowledge:  Fair  Language:  Good  Akathisia:  Negative  Handed:  Right  AIMS (if indicated):     Assets:  Desire for Improvement  ADL's:  Intact  Cognition:  WNL  Sleep:       Treatment Plan Summary: Daily contact with patient to assess and evaluate symptoms and progress in treatment, Medication management and Plan Patient is seen and examined.  Patient is a 78-year-old female with the above-stated past psychiatric history with a history of bipolar disorder, with most recent episode depressed.  She will be admitted to the hospital.  She will be integrated into the milieu.  She will be seen by social work both individually and in groups.  She will be encouraged to attend groups.  We will work on her compliance.  We will try and track down the  [facility] medications [facility], or her most recent discharge from [facility].  She had already been started on Depakote at 1000 mg p.o. nightly.  She had previously taken 500 mg p.o. twice daily.  She is also previously been on Seroquel, and I am going to re-add this to 50 mg p.o. nightly.  I am going to hold off on any antidepressants at this point until I get a better idea of which medicines may have led to problems with going into a manic phase.  We will also attempt to get collaborative information with regard to her history.  She does have a history of lung cancer, and has continued to smoke.  In the electronic medical record it does not appear as though she has had follow-up with family medicine since January 2017.  There are no oncology notes in the chart.  Her last CT scan of the chest was 2016 per our records, and it showed stable postsurgical changes in the right hemithorax.  There were no evidence of lung cancer recurrence or metastasis.  We will have to follow-up on some of this information.  Her ionized calcium was 1.19, her alkaline phosphatase was 81.  And her platelets are slightly low at 181,000.  Observation Level/Precautions:  15 minute checks  Laboratory:  Chemistry Profile  Psychotherapy:    Medications:    Consultations:    Discharge Concerns:    Estimated LOS:  Other:     Physician Treatment Plan for Primary Diagnosis: <principal problem not specified> Long Term Goal(s): Improvement in symptoms so as ready for discharge  Short Term Goals: Ability to identify changes in lifestyle to reduce recurrence of condition will improve, Ability to verbalize feelings will improve, Ability to disclose and discuss suicidal ideas, Ability to demonstrate self-control will improve, Ability to identify and develop effective coping behaviors will improve, Ability to maintain clinical measurements within normal limits will improve and Compliance with prescribed  medications will improve  Physician Treatment Plan for Secondary Diagnosis: Active Problems:   MDD (major depressive disorder), recurrent severe, without psychosis ([place])  Long Term Goal(s): Improvement in symptoms so as ready for discharge  Short Term Goals: Ability to identify changes in lifestyle to reduce recurrence of condition will improve, Ability to verbalize feelings will improve, Ability to disclose and discuss suicidal ideas, Ability to demonstrate self-control will improve, Ability to identify and develop effective coping behaviors will improve, Ability to maintain clinical measurements within normal limits will improve and Compliance with prescribed medications will improve  I certify that inpatient services furnished can reasonably be expected to improve the patient's condition.    Sharma Covert, MD 7/3/20191:42 PM

## 2017-11-14 NOTE — ED Notes (Signed)
Patient discharged to [facility] per [facility]-not home

## 2017-11-14 NOTE — BHH Counselor (Signed)
Adult Comprehensive Assessment  Patient ID: [name], female   DOB: [birth year], 62 y.o.   MRN: [id number]  Information Source: Information source: Patient  Current Stressors:  Patient states their primary concerns and needs for treatment are:: "suicidal thoughts and depression" Patient states their goals for this hospitilization and ongoing recovery are:: "I just want to feel better" Educational / Learning stressors: Patient denies any stressors  Employment / Job issues: Unemployed; Patient reports she lost her job back in March 2019 Family Relationships: Patient denies any stressors  Financial / Lack of resources (include bankruptcy): No income; No insurance Housing / Lack of housing: Patient reports living in [place], [place] alone in house.  Physical health (include injuries & life threatening diseases): Patient denies any stressors  Social relationships: Patient denies any stressors  Substance abuse: Patient denies any stressors  Bereavement / Loss: Patient reports losing her job is a major stressor  Living/Environment/Situation:  Living Arrangements: Alone Living conditions (as described by patient or guardian): "Comfortable" Who else lives in the home?: Alone How long has patient lived in current situation?: 1 year What is atmosphere in current home: Comfortable, Supportive  Family History:  Marital status: Single Are you sexually active?: No What is your sexual orientation?: Heterosexual  Has your sexual activity been affected by drugs, alcohol, medication, or emotional stress?: No Does patient have children?: Yes How many children?: 1 How is patient's relationship with their children?: Patient reports she has a good relationship with her only son.   Childhood History:  By whom was/is the patient raised?: Both parents Description of patient's relationship with caregiver when they were a child: Patient reports having a good relationship with her parents during her childhood.   Patient's description of current relationship with people who raised him/her: Patient reports she continues to have a good relationship with her parents currently.  How were you disciplined when you got in trouble as a child/adolescent?: "I really was not disciplined" Does patient have siblings?: Yes Number of Siblings: 4 Description of patient's current relationship with siblings: Patient reports having an "okay" relationship with her siblings  Did patient suffer any verbal/emotional/physical/sexual abuse as a child?: No Did patient suffer from severe childhood neglect?: No Has patient ever been sexually abused/assaulted/raped as an adolescent or adult?: No Was the patient ever a victim of a crime or a disaster?: No Witnessed domestic violence?: No Has patient been effected by domestic violence as an adult?: No  Education:  Highest grade of school patient has completed: 12th grade Currently a student?: No Learning disability?: No  Employment/Work Situation:   Employment situation: Unemployed(Patient reports she lost her job back in March 2019) Patient's job has been impacted by current illness: No 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 Patient also reports she recently applied for SSI and Disability) Does patient have a representative payee or guardian?: No  Alcohol/Substance Abuse:   What has been your use of drugs/alcohol within the last 12 months?: Patient denies any stressors  If attempted suicide, did drugs/alcohol play a role in this?: No Alcohol/Substance Abuse Treatment Hx: Denies past  history Has alcohol/substance  abuse ever caused legal problems?: No  Social Support System:   Patient's Community Support System: Good Describe Community Support System: "My sister is always there, and my mom" Type of faith/religion: Baptist How does patient's faith help to cope with current illness?: Prayer  Leisure/Recreation:   Leisure and Hobbies: "No"  Strengths/Needs:   What is the patient's perception of their strengths?: "I try to encourage other people" Patient states they can use these personal strengths during their treatment to contribute to their recovery: Yes Patient states these barriers may affect/interfere with their treatment: No Patient states these barriers may affect their return to the community: No  Other important information patient would like considered in planning for their treatment: No  Discharge Plan:   Currently receiving community mental health services: Yes (From Whom) Patient states concerns and preferences for aftercare planning are: Outpatient psychiatry and therapy services  Patient states they will know when they are safe and ready for discharge when: Yes Does patient have access to transportation?: Yes Does patient have financial barriers related to discharge medications?: Yes Patient description of barriers related to discharge medications: No income and no insurance  Will patient be returning to same living situation after discharge?: Yes  Summary/Recommendations:   Summary and Recommendations (to be completed by the evaluator): [name] is a 62 year old femle who is diagnosed with Major Depressive disorder. She presented to the hospital seeking treatment for worsening depression and suicidal ideation with no plan. During the assessment, [name] was pleasant and cooperative with providing information. [name] reports that she became severly depressed when she lost her job back in March. [name] reports tht she would like medication mangement that will  eliminate her depression and that she wants the appropriate follow up at discharge. [name] reports she plans to return to her home in [place], [place]. [name] can benefit from crisis stabilization, medication management, therapeutic milieu and referral services.   Marylee Floras. 11/14/2017

## 2017-11-14 NOTE — BHH Suicide Risk Assessment (Addendum)
[facility] INPATIENT:  Family/Significant Other Suicide Prevention Education  Suicide Prevention Education:  Education Completed; Sherian Maroon, sister [phone]) has been identified by the patient as the family member/significant other with whom the patient will be residing, and identified as the person(s) who will aid the patient in the event of a mental health crisis (suicidal ideations/suicide attempt).  With written consent from the patient, the family member/significant other has been provided the following suicide prevention education, prior to the and/or following the discharge of the patient.  The suicide prevention education provided includes the following:  Suicide risk factors  Suicide prevention and interventions  National Suicide Hotline telephone number  [facility] assessment telephone number  [facility] Emergency Assistance [facility] and/or Residential Mobile Crisis Unit telephone number  Request made of family/significant other to:  Remove weapons (e.g., guns, rifles, knives), all items previously/currently identified as safety concern.    Remove drugs/medications (over-the-counter, prescriptions, illicit drugs), all items previously/currently identified as a safety concern.  The family member/significant other verbalizes understanding of the suicide prevention education information provided.  The family member/significant other agrees to remove the items of safety concern listed above.  Marylee Floras 11/14/2017, 3:21 PM

## 2017-11-14 NOTE — ED Notes (Addendum)
Report given to Raquel Sarna, RN at [facility]. [facility] transportation called.
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
## 2017-11-14 NOTE — BHH Counselor (Signed)
Clinician explained the voluntary consent form, and the pt signed the form. Clinician faxed the voluntary consent and gave the copy to [employer], Therapist, sports.    Vertell Novak, MS, [facility], [facility] Triage Specialist [phone]

## 2017-11-14 NOTE — Tx Team (Signed)
Interdisciplinary Treatment and Diagnostic Plan Update  11/14/2017 Time of Session: 9:30am [name] MRN: [id number]  Principal Diagnosis: <principal problem not specified>  Secondary Diagnoses: Active Problems:   MDD (major depressive disorder), recurrent severe, without psychosis ([place])   Current Medications:  Current Facility-Administered Medications  Medication Dose Route Frequency Provider Last Rate Last Dose  . acetaminophen (TYLENOL) tablet 650 mg  650 mg Oral Q6H PRN Nanci Pina, FNP      . alum & mag hydroxide-simeth (MAALOX/MYLANTA) 200-200-20 MG/5ML suspension 30 mL  30 mL Oral Q4H PRN Lavina Hamman, Takia S, FNP      . divalproex (DEPAKOTE ER) 24 hr tablet 1,000 mg  1,000 mg Oral Daily Lindon Romp A, NP   1,000 mg at 11/14/17 0819  . magnesium hydroxide (MILK OF MAGNESIA) suspension 30 mL  30 mL Oral Daily PRN Nanci Pina, FNP      . potassium chloride SA (K-DUR,KLOR-CON) CR tablet 40 mEq  40 mEq Oral Daily Lindon Romp A, NP   40 mEq at 11/14/17 0819  . QUEtiapine (SEROQUEL) tablet 50 mg  50 mg Oral QHS Sharma Covert, MD      . traZODone (DESYREL) tablet 300 mg  300 mg Oral QHS Lindon Romp A, NP   300 mg at 11/14/17 0202   PTA Medications: Medications Prior to Admission  Medication Sig Dispense Refill Last Dose  . trazodone (DESYREL) 300 MG tablet Take 300 mg by mouth at bedtime.     . divalproex (DEPAKOTE ER) 500 MG 24 hr tablet Take 1,000 mg by mouth daily.  1 11/13/2017 at Unknown time  . potassium chloride SA (K-DUR,KLOR-CON) 20 MEQ tablet Take 2 tablets (40 mEq total) by mouth daily. 6 tablet 0   . venlafaxine XR (EFFEXOR-XR) 37.5 MG 24 hr capsule Take 37.5 mg by mouth daily.  1 11/13/2017 at Unknown time    Patient Stressors: Financial difficulties Medication change or noncompliance  Patient Strengths: Capable of independent living Curator fund of knowledge Motivation for treatment/growth Work skills  Treatment Modalities:  Medication Management, Group therapy, Case management,  1 to 1 session with clinician, Psychoeducation, Recreational therapy.   Physician Treatment Plan for Primary Diagnosis: <principal problem not specified> Long Term Goal(s):     Short Term Goals:    Medication Management: Evaluate patient's response, side effects, and tolerance of medication regimen.  Therapeutic Interventions: 1 to 1 sessions, Unit Group sessions and Medication administration.  Evaluation of Outcomes: Not Met  Physician Treatment Plan for Secondary Diagnosis: Active Problems:   MDD (major depressive disorder), recurrent severe, without psychosis ([place])  Long Term Goal(s):     Short Term Goals:       Medication Management: Evaluate patient's response, side effects, and tolerance of medication regimen.  Therapeutic Interventions: 1 to 1 sessions, Unit Group sessions and Medication administration.  Evaluation of Outcomes: Not Met   RN Treatment Plan for Primary Diagnosis: <principal problem not specified> Long Term Goal(s): Knowledge of disease and therapeutic regimen to maintain health will improve  Short Term Goals: Ability to disclose and discuss suicidal ideas, Ability to identify and develop effective coping behaviors will improve and Compliance with prescribed medications will improve  Medication Management: RN will administer medications as ordered by provider, will assess and evaluate patient's response and provide education to patient for prescribed medication. RN will report any adverse and/or side effects to prescribing provider.  Therapeutic Interventions: 1 on 1 counseling sessions, Psychoeducation, Medication administration, Evaluate responses to treatment,  Monitor vital signs and CBGs as ordered, Perform/monitor CIWA, COWS, AIMS and Fall Risk screenings as ordered, Perform wound care treatments as ordered.  Evaluation of Outcomes: Not Met   LCSW Treatment Plan for Primary Diagnosis: <principal  problem not specified> Long Term Goal(s): Safe transition to appropriate next level of care at discharge, Engage patient in therapeutic group addressing interpersonal concerns.  Short Term Goals: Engage patient in aftercare planning with referrals and resources  Therapeutic Interventions: Assess for all discharge needs, 1 to 1 time with Social worker, Explore available resources and support systems, Assess for adequacy in community support network, Educate family and significant other(s) on suicide prevention, Complete Psychosocial Assessment, Interpersonal group therapy.  Evaluation of Outcomes: Not Met   Progress in Treatment: Attending groups: Yes. Participating in groups: Yes. Taking medication as prescribed: Yes. Toleration medication: Yes. Family/Significant other contact made: No, will contact:  the patient's sister Patient understands diagnosis: Yes. Discussing patient identified problems/goals with staff: Yes. Medical problems stabilized or resolved: Yes. Denies suicidal/homicidal ideation: Yes. Issues/concerns per patient self-inventory: No. Other:   New problem(s) identified:None  New Short Term/Long Term Goal(s):medication stabilization, elimination of SI thoughts, development of comprehensive mental wellness plan.    Patient Goals:  "I need helo with my medications, depression, appetite and not being able to sleep"  Discharge Plan or Barriers: CSW will assess for appropriate referrals   Reason for Continuation of Hospitalization: Anxiety Depression Medication stabilization Suicidal ideation  Estimated Length of Stay: 3-5 days   Attendees: Patient: [name]  11/14/2017 8:39 AM  Physician: Dr. Myles Lipps, MD 11/14/2017 8:39 AM  Nursing: Benjamine Mola, [place] 11/14/2017 8:39 AM  RN Care Manager: Rhunette Croft 11/14/2017 8:39 AM  Social Worker: Radonna Ricker, [place] 11/14/2017 8:39 AM  Recreational Therapist: Rhunette Croft 11/14/2017 8:39 AM  Other: X 11/14/2017 8:39 AM  Other: X 11/14/2017 8:39 AM   Other:X 11/14/2017 8:39 AM    Scribe for Treatment Team: Marylee Floras, [place] 11/14/2017 8:39 AM

## 2017-11-15 LAB — POTASSIUM: Potassium: 4.1 mmol/L (ref 3.5–5.1)

## 2017-11-15 MED ORDER — QUETIAPINE FUMARATE 100 MG PO TABS
100.0000 mg | ORAL_TABLET | Freq: Every day | ORAL | Status: DC
  Administered 2017-11-15: 100 mg via ORAL
  Filled 2017-11-15 (×2): qty 1

## 2017-11-15 MED ORDER — TRAZODONE HCL 100 MG PO TABS
100.0000 mg | ORAL_TABLET | Freq: Every evening | ORAL | Status: DC | PRN
  Administered 2017-11-15 – 2017-11-17 (×3): 100 mg via ORAL
  Filled 2017-11-15 (×3): qty 1

## 2017-11-15 MED ORDER — TIZANIDINE HCL 2 MG PO TABS
4.0000 mg | ORAL_TABLET | Freq: Four times a day (QID) | ORAL | Status: DC | PRN
  Administered 2017-11-15: 4 mg via ORAL
  Filled 2017-11-15: qty 2

## 2017-11-15 NOTE — Progress Notes (Signed)
D: Patient denies SI, HI or AVH this evening.  Pt. Presents as depressed and flat with minimal interaction.  She states that her goal for today was "to talk to people which I have done".  Pt. Did attend and participate in evening wrap up group.  She denies any physical complaints.    A: Patient given emotional support from RN. Patient encouraged to come to staff with concerns and/or questions. Patient's medication routine continued. Patient's orders and plan of care reviewed.   R: Patient remains appropriate and cooperative. Will continue to monitor patient q15 minutes for safety.

## 2017-11-15 NOTE — Progress Notes (Signed)
Assuming care- SBARR received from Erika RN @ 0210 pt resting in bed with eyes closed. Respirations even and unlabored. Pt continues to remain safe on the unit/ Observed by 15 min rounds. RN will continue to monitor. 

## 2017-11-15 NOTE — Progress Notes (Signed)
[facility] MD Progress Note  11/15/2017 1:22 PM [name]  MRN:  [id number] Subjective: Patient is seen and examined.  Patient is a 62 year old female with a probable past psychiatric history significant for bipolar disorder, alcohol use disorder and depression who presented to the behavioral health hospital for admission on 11/13/2017.  She was unaccompanied at that time.  The patient stated that she had worsening depression and suicidal ideation over the last 3 months prior to admission.  She was admitted to the hospital.  She was started on Depakote ER one thousand milligrams p.o. daily, Seroquel 50 mg p.o. nightly and trazodone.  She states she did not take the trazodone last night, and she slept 6.5 hours.  She was scheduled to get 300 mg of trazodone (which is what she stated she had been taking).  She denied any auditory, visual or tactile hallucinations.  No worsening racing thoughts or pressured speech.  She stated her mood was somewhat better.  She denied any side effects of her current medications. Principal Problem: <principal problem not specified> Diagnosis:   Patient Active Problem List   Diagnosis Date Noted  . MDD (major depressive disorder), recurrent severe, without psychosis ([place]) [F33.2] 11/14/2017  . Severe bipolar I disorder, most recent episode depressed ([place]) [F31.4]   . 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 Lung cancer ([place]) [C34.90] 04/24/2011   Total Time spent with patient: 20 minutes  Past Psychiatric History: See admission H&P  Past Medical History:  Past Medical History:  Diagnosis Date  . Allergy   . Depression   . Drug abuse ([place])    history of, went to rehab 2015  . GERD (gastroesophageal reflux disease)   . Lung cancer ([place])    lung ca dx 11/11- right upper lobe    Past Surgical History:  Procedure Laterality Date  . ANKLE FRACTURE SURGERY Left   . LUNG LOBECTOMY Right 2011   RUL removed  for lung cancer  . STYLOID PROCESS EXCISION Left 10/16/2014   Procedure: EXCISION LEFT STYLOID PROCESS;  Surgeon: Rozetta Nunnery, MD;  Location: [facility];  Service: ENT;  Laterality: Left;  . TONSILLECTOMY Bilateral 10/16/2014   Procedure: BILATERAL TONSILLECTOMY;  Surgeon: Rozetta Nunnery, MD;  Location: [facility];  Service: ENT;  Laterality: Bilateral;  . TUBAL LIGATION     Family History:  Family History  Problem Relation Age of Onset  . Cancer Mother   . Cancer Father    Family Psychiatric  History: See admission H&P Social History:  Social History   Substance and Sexual Activity  Alcohol Use No     Social History   Substance and Sexual Activity  Drug Use No    Social History   Socioeconomic History  . Marital status: Single    Spouse name: Not on file  . Number of children: Not on file  . Years of education: Not on file  . Highest education level: Not on file  Occupational History  . Not on file  Social Needs  . Financial resource strain: Not on file  . Food insecurity:    Worry: Not on file    Inability: Not on file  . Transportation needs:    Medical: Not on file    Non-medical: Not on file  Tobacco Use  . Smoking status: Former Smoker    Last attempt to quit: 05/15/2013    Years since quitting: 4.5  .  Smokeless tobacco: Never Used  . Tobacco comment: E-sig " pnce in a while"  Substance and Sexual Activity  . Alcohol use: No  . Drug use: No  . Sexual activity: Not on file  Lifestyle  . Physical activity:    Days per week: Not on file    Minutes per session: Not on file  . Stress: Not on file  Relationships  . Social connections:    Talks on phone: Not on file    Gets together: Not on file    Attends religious service: Not on file    Active member of club or organization: Not on file    Attends meetings of clubs or organizations: Not on file    Relationship status: Not on file  Other Topics Concern  . Not  on file  Social History Narrative  . Not on file   Additional Social History:                         Sleep: Fair  Appetite:  Fair  Current Medications: Current Facility-Administered Medications  Medication Dose Route Frequency Provider Last Rate Last Dose  . acetaminophen (TYLENOL) tablet 650 mg  650 mg Oral Q6H PRN Nanci Pina, FNP   650 mg at 11/14/17 1657  . alum & mag hydroxide-simeth (MAALOX/MYLANTA) 200-200-20 MG/5ML suspension 30 mL  30 mL Oral Q4H PRN Lavina Hamman, Takia S, FNP      . divalproex (DEPAKOTE ER) 24 hr tablet 1,000 mg  1,000 mg Oral Daily Lindon Romp A, NP   1,000 mg at 11/15/17 0813  . magnesium hydroxide (MILK OF MAGNESIA) suspension 30 mL  30 mL Oral Daily PRN Nanci Pina, FNP      . potassium chloride SA (K-DUR,KLOR-CON) CR tablet 40 mEq  40 mEq Oral Daily Lindon Romp A, NP   40 mEq at 11/15/17 0813  . QUEtiapine (SEROQUEL) tablet 100 mg  100 mg Oral QHS Sharma Covert, MD      . tiZANidine (ZANAFLEX) tablet 4 mg  4 mg Oral Q6H PRN Sharma Covert, MD   4 mg at 11/15/17 1201  . traZODone (DESYREL) tablet 100 mg  100 mg Oral QHS PRN Sharma Covert, MD        Lab Results:  Results for orders placed or performed during the hospital encounter of 11/14/17 (from the past 48 hour(s))  Lipid panel     Status: Abnormal   Collection Time: 11/14/17  6:52 AM  Result Value Ref Range   Cholesterol 178 0 - 200 mg/dL   Triglycerides 215 (H) <150 mg/dL   HDL 29 (L) >40 mg/dL   Total CHOL/HDL Ratio 6.1 RATIO   VLDL 43 (H) 0 - 40 mg/dL   LDL Cholesterol 106 (H) 0 - 99 mg/dL    Comment:        Total Cholesterol/HDL:CHD Risk Coronary Heart Disease Risk Table                     Men   Women  1/2 Average Risk   3.4   3.3  Average Risk       5.0   4.4  2 X Average Risk   9.6   7.1  3 X Average Risk  23.4   11.0        Use the calculated Patient Ratio above and the CHD Risk Table to determine the patient's CHD Risk.  ATP III  CLASSIFICATION (LDL):  <100     mg/dL   Optimal  100-129  mg/dL   Near or Above                    Optimal  130-159  mg/dL   Borderline  160-189  mg/dL   High  >190     mg/dL   Very High Performed at [facility] [address], [place], [place] [address]   Hemoglobin A1c     Status: None   Collection Time: 11/14/17  6:52 AM  Result Value Ref Range   Hgb A1c MFr Bld 5.1 4.8 - 5.6 %    Comment: (NOTE) Pre diabetes:          5.7%-6.4% Diabetes:              >6.4% Glycemic control for   <7.0% adults with diabetes    Mean Plasma Glucose 99.67 mg/dL    Comment: Performed at [facility] [address] [place], [place] [address]  TSH     Status: None   Collection Time: 11/14/17  6:52 AM  Result Value Ref Range   TSH 2.025 0.350 - 4.500 uIU/mL    Comment: Performed by a 3rd Generation assay with a functional sensitivity of <=0.01 uIU/mL. Performed at [facility], [place] [address] [place], [place] [address]   Potassium     Status: None   Collection Time: 11/15/17  6:58 AM  Result Value Ref Range   Potassium 4.1 3.5 - 5.1 mmol/L    Comment: Performed at [facility], [place] [address] [place], [place] [address]    Blood Alcohol level:  Lab Results  Component Value Date   ETH <10 11/13/2017   ETH 179 (H) 93/23/5573    Metabolic Disorder Labs: Lab Results  Component Value Date   HGBA1C 5.1 11/14/2017   MPG 99.67 11/14/2017   No results found for: PROLACTIN Lab Results  Component Value Date   CHOL 178 11/14/2017   TRIG 215 (H) 11/14/2017   HDL 29 (L) 11/14/2017   CHOLHDL 6.1 11/14/2017   VLDL 43 (H) 11/14/2017   LDLCALC 106 (H) 11/14/2017    Physical Findings: AIMS: Facial and Oral Movements Muscles of Facial Expression: None, normal Lips and Perioral Area: None, normal Jaw: None, normal Tongue: None, normal,Extremity Movements Upper (arms, wrists, hands, fingers): None, normal Lower (legs, knees,  ankles, toes): None, normal, Trunk Movements Neck, shoulders, hips: None, normal, Overall Severity Severity of abnormal movements (highest score from questions above): None, normal Incapacitation due to abnormal movements: None, normal Patient's awareness of abnormal movements (rate only patient's report): No Awareness, Dental Status Current problems with teeth and/or dentures?: No Does patient usually wear dentures?: No  CIWA:    COWS:     Musculoskeletal: Strength & Muscle Tone: within normal limits Gait & Station: normal Patient leans: N/A  Psychiatric Specialty Exam: Physical Exam  Nursing note and vitals reviewed. Constitutional: She is oriented to person, place, and time. She appears well-developed and well-nourished.  HENT:  Head: Normocephalic and atraumatic.  Respiratory: Effort normal.  Neurological: She is alert and oriented to person, place, and time.    ROS  Blood pressure (!) 84/67, pulse (!) 105, temperature (!) 97.5 F (36.4 C), temperature source Oral, resp. rate 16, height 5\' 8"  (1.727 m), weight 71.2 kg (157 lb), SpO2 96 %.Body mass index is 23.87 kg/m.  General Appearance: Casual  Eye  Contact:  Fair  Speech:  Normal Rate  Volume:  Normal  Mood:  Anxious  Affect:  Appropriate  Thought Process:  Coherent  Orientation:  Full (Time, Place, and Person)  Thought Content:  Logical  Suicidal Thoughts:  No  Homicidal Thoughts:  No  Memory:  Immediate;   Fair Recent;   Fair Remote;   Fair  Judgement:  Intact  Insight:  Fair  Psychomotor Activity:  Normal  Concentration:  Concentration: Fair and Attention Span: Fair  Recall:  [employer] of Knowledge:  Fair  Language:  Fair  Akathisia:  Negative  Handed:  Right  AIMS (if indicated):     Assets:  Desire for Improvement Resilience  ADL's:  Intact  Cognition:  WNL  Sleep:  Number of Hours: 6.5     Treatment Plan Summary: Daily contact with patient to assess and evaluate symptoms and progress in  treatment, Medication management and Plan Patient is seen and examined.  Patient is a 62 year old female with the above-stated past psychiatric history is seen in follow-up.  She is doing better today.  She continues on Depakote, but I am can increase her Seroquel at at bedtime 200 mg.  I am going to reduce her trazodone 200 mg p.o. nightly and change that as needed.  She did complain of muscle cramps, and she continues to get potassium supplementation after it was low in the emergency room.  Recheck of that this morning put her potassium at 4.1.  I am going to give her some Zanaflex 4 mg p.o. every 6 hours as needed leg cramps.  Otherwise no other changes to her medicines.  Her blood pressure remains low, but stable.  She is a bit tachycardic.  We will continue to monitor that.  Sharma Covert, MD 11/15/2017, 1:22 PM

## 2017-11-15 NOTE — Progress Notes (Signed)
Adult Psychoeducational Group Note  Date:  11/15/2017 Time:  9:59 PM  Group Topic/Focus:  Wrap-Up Group:   The focus of this group is to help patients review their daily goal of treatment and discuss progress on daily workbooks.  Participation Level:  Active  Participation Quality:  Appropriate  Affect:  Appropriate  Cognitive:  Appropriate  Insight: Appropriate  Engagement in Group:  Engaged  Modes of Intervention:  Discussion  Additional Comments:  Pt expressed that the day was long and boring and it being a holiday was depressing.  Pt expressed that she presents like everything is fine but she keeps her true feelings inside. Pt rated the day at 6/10.  Lan Entsminger 11/15/2017, 9:59 PM

## 2017-11-15 NOTE — Progress Notes (Signed)
Pt presents with a flat affect and depressed mood. Pt reports ongoing depression and anxiety. Pt reports poor sleep at bedtime due to nightmares and racing thoughts. Pt denies SI/HI. Pt compliant with taking meds. No side effects to meds verbalized by pt. Pt c/o of ongoing leg pain and requested a muscle relaxer.  Orders reviewed with pt. Verbal support provided. Pt encouraged to attend groups. V/s assessed. 15 minute checks performed for safety.  Pt compliant with tx.

## 2017-11-15 NOTE — Progress Notes (Signed)
Adult Psychoeducational Group Note  Date:  11/15/2017 Time:  1:03 AM  Group Topic/Focus:  Wrap-Up Group:   The focus of this group is to help patients review their daily goal of treatment and discuss progress on daily workbooks.  Participation Level:  Active  Participation Quality:  Appropriate  Affect:  Blunted  Cognitive:  Appropriate  Insight: Appropriate  Engagement in Group:  Engaged  Modes of Intervention:  Discussion  Additional Comments:  Pt expressed that she had a more positive day due to coming out of her room and talking/socializing more.  Pt rated her day at a 5/10.  Jordane Hisle 11/15/2017, 1:03 AM

## 2017-11-15 NOTE — Plan of Care (Signed)
  Problem: Activity: Goal: Interest or engagement in activities will improve Outcome: Progressing   Problem: Coping: Goal: Ability to verbalize frustrations and anger appropriately will improve Outcome: Progressing Goal: Ability to demonstrate self-control will improve Outcome: Progressing   Problem: Safety: Goal: Periods of time without injury will increase Outcome: Progressing   Problem: Activity: Goal: Sleeping patterns will improve Outcome: Not Progressing

## 2017-11-16 LAB — URINALYSIS, COMPLETE (UACMP) WITH MICROSCOPIC
BILIRUBIN URINE: NEGATIVE
GLUCOSE, UA: NEGATIVE mg/dL
HGB URINE DIPSTICK: NEGATIVE
Ketones, ur: NEGATIVE mg/dL
NITRITE: POSITIVE — AB
PROTEIN: NEGATIVE mg/dL
Specific Gravity, Urine: 1.006 (ref 1.005–1.030)
pH: 7 (ref 5.0–8.0)

## 2017-11-16 MED ORDER — GABAPENTIN 100 MG PO CAPS
100.0000 mg | ORAL_CAPSULE | Freq: Three times a day (TID) | ORAL | Status: DC
  Administered 2017-11-16 – 2017-11-17 (×3): 100 mg via ORAL
  Filled 2017-11-16 (×7): qty 1

## 2017-11-16 MED ORDER — CYCLOBENZAPRINE HCL 10 MG PO TABS
5.0000 mg | ORAL_TABLET | Freq: Three times a day (TID) | ORAL | Status: DC | PRN
  Administered 2017-11-16 – 2017-11-20 (×6): 5 mg via ORAL
  Filled 2017-11-16 (×4): qty 1

## 2017-11-16 MED ORDER — QUETIAPINE FUMARATE 200 MG PO TABS
200.0000 mg | ORAL_TABLET | Freq: Every day | ORAL | Status: DC
  Administered 2017-11-16: 200 mg via ORAL
  Filled 2017-11-16 (×2): qty 1

## 2017-11-16 NOTE — Progress Notes (Signed)
Recreation Therapy Notes  Date: 7.5.19 Time: 0930 Location: 300 Hall Dayroom  Group Topic: Stress Management  Goal Area(s) Addresses:  Patient will verbalize importance of using healthy stress management.  Patient will identify positive emotions associated with healthy stress management.   Intervention: Stress Management  Activity :  Meditation.  LRT introduced the stress management technique of meditation.  LRT played a meditation on resilience that allowed patients to focus on being able to withstand obstacles that arise.  Patients were to listen and follow along as meditation played to engage in the activity.  Education:  Stress Management, Discharge Planning.   Education Outcome: Acknowledges edcuation/In group clarification offered/Needs additional education  Clinical Observations/Feedback: Pt did not attend group.     Herny Scurlock, LRT/CTRS          Daniyah Fohl A 11/16/2017 11:06 AM 

## 2017-11-16 NOTE — Plan of Care (Signed)
  Problem: Education: Goal: Verbalization of understanding the information provided will improve Outcome: Progressing

## 2017-11-16 NOTE — Progress Notes (Signed)
D:  [name] reported her day was "ok but today has been a long day."  She is sad because her family is at the beach but was able to talk with them on the phone.  She denied SI/HI or A/V hallucinations.  She appears to be in no physical distress.  She has been attending groups but is minimal with peers.  She requested her hs medication at 9pm so she could get some sleep tonight.  "I hope the increase works."    A:  1:1 with RN for support and encouragement.  Medications as ordered.  Q 15 minute checks maintained for safety.  Encouraged participation in group and unit activities.   R:  [name] remains safe on the unit.  We will continue to monitor the progress towards her goals.

## 2017-11-16 NOTE — Progress Notes (Signed)
[facility] MD Progress Note  11/16/2017 11:15 AM [name]  MRN:  [id number] Subjective: Patient seen and examined.  Patient is a 62-year-old female with a probable past psychiatric history significant for bipolar disorder, alcohol use disorder and depression who presented earlier this week for admission on 11/13/2017.  She had a fairly good day yesterday, but today she is not having a great day.  She did sleep better with the increased dose of the Seroquel, but still not great.  She states she woke up several times.  She also complained of leg pain.  She admitted to a history of restless leg syndrome.  On physical examination she did not have any cogwheeling today.  She was a little disappointed later yesterday afternoon when she was unable to get a hold of all of her family members because of the holiday.  She stated she did speak to her sister last night and they had a good conversation.  She does complain of some significant leg pain today.  She stated the type tizanidine helped a bit, but felt oversedated by it during the day.  We discussed options. Principal Problem: <principal problem not specified> Diagnosis:   Patient Active Problem List   Diagnosis Date Noted  . MDD (major depressive disorder), recurrent severe, without psychosis ([place]) [F33.2] 11/14/2017  . Severe bipolar I disorder, most recent episode depressed ([place]) [F31.4]   . 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 Lung cancer ([place]) [C34.90] 04/24/2011   Total Time spent with patient: 20 minutes  Past Psychiatric History: See admission H&P  Past Medical History:  Past Medical History:  Diagnosis Date  . Allergy   . Depression   . Drug abuse ([place])    history of, went to rehab 2015  . GERD (gastroesophageal reflux disease)   . Lung cancer ([place])    lung ca dx 11/11- right upper lobe    Past Surgical History:  Procedure Laterality Date  . ANKLE FRACTURE SURGERY Left    . LUNG LOBECTOMY Right 2011   RUL removed for lung cancer  . STYLOID PROCESS EXCISION Left 10/16/2014   Procedure: EXCISION LEFT STYLOID PROCESS;  Surgeon: Rozetta Nunnery, MD;  Location: [place];  Service: ENT;  Laterality: Left;  . TONSILLECTOMY Bilateral 10/16/2014   Procedure: BILATERAL TONSILLECTOMY;  Surgeon: Rozetta Nunnery, MD;  Location: [place];  Service: ENT;  Laterality: Bilateral;  . TUBAL LIGATION     Family History:  Family History  Problem Relation Age of Onset  . Cancer Mother   . Cancer Father    Family Psychiatric  History: See admission H&P Social History:  Social History   Substance and Sexual Activity  Alcohol Use No     Social History   Substance and Sexual Activity  Drug Use No    Social History   Socioeconomic History  . Marital status: Single    Spouse name: Not on file  . Number of children: Not on file  . Years of education: Not on file  . Highest education level: Not on file  Occupational History  . Not on file  Social Needs  . Financial resource strain: Not on file  . Food insecurity:    Worry: Not on file    Inability: Not on file  . Transportation needs:    Medical: Not on file    Non-medical: Not on file  Tobacco Use  . Smoking status:  Former Smoker    Last attempt to quit: 05/15/2013    Years since quitting: 4.5  . Smokeless tobacco: Never Used  . Tobacco comment: E-sig " pnce in a while"  Substance and Sexual Activity  . Alcohol use: No  . Drug use: No  . Sexual activity: Not on file  Lifestyle  . Physical activity:    Days per week: Not on file    Minutes per session: Not on file  . Stress: Not on file  Relationships  . Social connections:    Talks on phone: Not on file    Gets together: Not on file    Attends religious service: Not on file    Active member of club or organization: Not on file    Attends meetings of clubs or organizations: Not on file    Relationship status:  Not on file  Other Topics Concern  . Not on file  Social History Narrative  . Not on file   Additional Social History:                         Sleep: Fair  Appetite:  Fair  Current Medications: Current Facility-Administered Medications  Medication Dose Route Frequency Provider Last Rate Last Dose  . acetaminophen (TYLENOL) tablet 650 mg  650 mg Oral Q6H PRN Nanci Pina, FNP   650 mg at 11/14/17 1657  . alum & mag hydroxide-simeth (MAALOX/MYLANTA) 200-200-20 MG/5ML suspension 30 mL  30 mL Oral Q4H PRN Starkes, Takia S, FNP      . cyclobenzaprine (FLEXERIL) tablet 5 mg  5 mg Oral TID PRN Sharma Covert, MD   5 mg at 11/16/17 1050  . divalproex (DEPAKOTE ER) 24 hr tablet 1,000 mg  1,000 mg Oral Daily Lindon Romp A, NP   1,000 mg at 11/16/17 1046  . gabapentin (NEURONTIN) capsule 100 mg  100 mg Oral TID Sharma Covert, MD   100 mg at 11/16/17 1049  . magnesium hydroxide (MILK OF MAGNESIA) suspension 30 mL  30 mL Oral Daily PRN Nanci Pina, FNP      . potassium chloride SA (K-DUR,KLOR-CON) CR tablet 40 mEq  40 mEq Oral Daily Lindon Romp A, NP   40 mEq at 11/16/17 1046  . QUEtiapine (SEROQUEL) tablet 200 mg  200 mg Oral QHS Sharma Covert, MD      . traZODone (DESYREL) tablet 100 mg  100 mg Oral QHS PRN Sharma Covert, MD   100 mg at 11/15/17 2105    Lab Results:  Results for orders placed or performed during the hospital encounter of 11/14/17 (from the past 48 hour(s))  Potassium     Status: None   Collection Time: 11/15/17  6:58 AM  Result Value Ref Range   Potassium 4.1 3.5 - 5.1 mmol/L    Comment: Performed at [facility], [place] [address] [place],  [address]    Blood Alcohol level:  Lab Results  Component Value Date   ETH <10 11/13/2017   ETH 179 (H) 83/41/9622    Metabolic Disorder Labs: Lab Results  Component Value Date   HGBA1C 5.1 11/14/2017   MPG 99.67 11/14/2017   No results found for: PROLACTIN Lab  Results  Component Value Date   CHOL 178 11/14/2017   TRIG 215 (H) 11/14/2017   HDL 29 (L) 11/14/2017   CHOLHDL 6.1 11/14/2017   VLDL 43 (H) 11/14/2017   LDLCALC 106 (H) 11/14/2017  Physical Findings: AIMS: Facial and Oral Movements Muscles of Facial Expression: None, normal Lips and Perioral Area: None, normal Jaw: None, normal Tongue: None, normal,Extremity Movements Upper (arms, wrists, hands, fingers): None, normal Lower (legs, knees, ankles, toes): None, normal, Trunk Movements Neck, shoulders, hips: None, normal, Overall Severity Severity of abnormal movements (highest score from questions above): None, normal Incapacitation due to abnormal movements: None, normal Patient's awareness of abnormal movements (rate only patient's report): No Awareness, Dental Status Current problems with teeth and/or dentures?: No Does patient usually wear dentures?: No  CIWA:    COWS:     Musculoskeletal: Strength & Muscle Tone: within normal limits Gait & Station: broad based Patient leans: N/A  Psychiatric Specialty Exam: Physical Exam  Nursing note and vitals reviewed. Constitutional: She is oriented to person, place, and time. She appears well-developed and well-nourished.  HENT:  Head: Normocephalic and atraumatic.  Respiratory: Effort normal.  Neurological: She is alert and oriented to person, place, and time.    ROS  Blood pressure (!) 89/53, pulse 66, temperature 98.3 F (36.8 C), temperature source Oral, resp. rate 16, height 5\' 8"  (1.727 m), weight 71.2 kg (157 lb), SpO2 96 %.Body mass index is 23.87 kg/m.  General Appearance: Disheveled  Eye Contact:  Fair  Speech:  Slow  Volume:  Decreased  Mood:  Depressed  Affect:  Congruent  Thought Process:  Coherent  Orientation:  Full (Time, Place, and Person)  Thought Content:  Logical  Suicidal Thoughts:  No  Homicidal Thoughts:  No  Memory:  Immediate;   Fair Recent;   Fair Remote;   Fair  Judgement:  Intact   Insight:  Fair  Psychomotor Activity:  Restlessness  Concentration:  Concentration: Fair and Attention Span: Fair  Recall:  [employer] of Knowledge:  Fair  Language:  Fair  Akathisia:  Negative  Handed:  Right  AIMS (if indicated):     Assets:  Communication Skills Desire for Improvement Housing Resilience Social Support  ADL's:  Intact  Cognition:  WNL  Sleep:  Number of Hours: 6.75     Treatment Plan Summary: Daily contact with patient to assess and evaluate symptoms and progress in treatment, Medication management and Plan Patient is seen and examined.  Patient is a 62 year old female with the above-stated past psychiatric history seen in follow-up.  We are going to make some changes in her medications today.  Her sleep is still not good, and she is still showing depressive symptoms.  I will increase her Seroquel to 200 mg p.o. nightly.  Hopefully this will get to the point where she does not require the trazodone.  I believe that some of her fatigue and lethargy this a.m. is due to the hangover from the trazodone.  We will continue the Depakote at thousand milligrams p.o. daily.  She has a CBC with differential, complete metabolic panel, and a Depakote level ordered for this weekend.  She felt like the Zanaflex made her more sleepy, and I am going to stop that today.  We will switch to Flexeril 5 mg p.o. 3 times daily as needed muscle spasms.  She also complains of a history of restless leg syndrome, so I will add Neurontin 100 mg p.o. 3 times daily.  I will also discuss with the pharmacy about adding melatonin 5 mg nightly as well.  This again may be related to her potassium, and we have a complete metabolic panel pending.  No other changes in her medicines at this time.  She does continue to be mildly hypotensive, and we are encouraging fluids.  Sharma Covert, MD 11/16/2017, 11:15 AM

## 2017-11-16 NOTE — Plan of Care (Signed)
[name] reported that she is feeling better.  She continues to report depression and feeling sad because her family is at the beach.  She continues to report difficulty sleeping and is hoping her change in medications is effective tonight.  We will continue to monitor the progress towards her goals.

## 2017-11-16 NOTE — Progress Notes (Signed)
Adult Psychoeducational Group Note  Date:  11/16/2017 Time:  10:20 PM  Group Topic/Focus:  Wrap-Up Group:   The focus of this group is to help patients review their daily goal of treatment and discuss progress on daily workbooks.  Participation Level:  Did Not Attend  Participation Quality:    Affect:    Cognitive:    Insight:  Engagement in Group:    Modes of Intervention:    Additional Comments:  Pt was invited to participate in group but declined.  Marisal Swarey 11/16/2017, 10:20 PM

## 2017-11-16 NOTE — Progress Notes (Signed)
D Pt is observed OOB UAL on the 400 hall today, tolerated fair. She is reserved, she avoids eye contact, she is depressed and quiet and she is sad.    AShe completed her daily assessment and on this she wrote she denied SI today and she rated her dperession, hopelessness and anxeity " 10/18/08", respectively. Urine is sent per MD order.     R Safety is in place.

## 2017-11-16 NOTE — B[facility] Group Notes (Signed)
[place] Group Notes:  (Nursing/MHT/Case Management/Adjunct)  Date:  11/16/2017  Time:  4:00 pm  Type of Therapy:  Nurse Education  Participation Level:  Active  Participation Quality:  Appropriate and Attentive  Affect:  Anxious and Appropriate  Cognitive:  Alert, Appropriate and Oriented  Insight:  Appropriate and Good  Engagement in Group:  Developing/Improving and Engaged  Modes of Intervention:  Activity, Education and Problem-solving  Summary of Progress/Problems: pt attentive and appropriate in group. Pt supportive of peers.   Otelia Limes Yee Joss 11/16/2017, 6:07 PM

## 2017-11-17 MED ORDER — QUETIAPINE FUMARATE 300 MG PO TABS
300.0000 mg | ORAL_TABLET | Freq: Every day | ORAL | Status: DC
  Administered 2017-11-17: 300 mg via ORAL
  Filled 2017-11-17 (×2): qty 1

## 2017-11-17 MED ORDER — GABAPENTIN 100 MG PO CAPS
200.0000 mg | ORAL_CAPSULE | Freq: Three times a day (TID) | ORAL | Status: DC
  Administered 2017-11-17 – 2017-11-23 (×18): 200 mg via ORAL
  Filled 2017-11-17 (×24): qty 2

## 2017-11-17 NOTE — Progress Notes (Signed)
D Pt remains guarded, quiet and endorses a flat, hopeless depression. Pt does not make eye contact. She presents to this nurse first thing this morning and says 'I know my BP is low.Marland Kitchenibuprofen feel weak". Pt's manual BP  By this writer is 92/60. Pt given pitcher of gatorade  and she is encouraged to drink this.    A SHe completed her daily assessment and on this she wrote she denied SI today and she rated her depression, hopelessness and anxeity " 5/6/6/", respectively. She says she is " beginning " to feel better and that she is realizing " I needed the mediciene".    R Safety is in place and poc cont.

## 2017-11-17 NOTE — BHH Group Notes (Signed)
[place] Group Notes:  (Nursing)  Date:  11/17/2017  Time: 1:15 PM Type of Therapy:  Nurse Education  Participation Level:  Active  Participation Quality:  Appropriate  Affect:  Appropriate  Cognitive:  Appropriate  Insight:  Appropriate  Engagement in Group:  Engaged  Modes of Intervention:  Discussion and Education  Summary : Nurse led group: Identifying Needs  Waymond Cera 11/17/2017, 4:02 PM

## 2017-11-17 NOTE — BHH Group Notes (Signed)
LCSW Group Therapy Note  11/17/2017   10:00--11:00am   Type of Therapy and Topic:  Group Therapy: Anger Cues and Responses  Participation Level:  Active   Description of Group:   In this group, patients learned how to recognize the physical, cognitive, emotional, and behavioral responses they have to anger-provoking situations.  They identified a recent time they became angry and how they reacted.  They analyzed how their reaction was possibly beneficial and how it was possibly unhelpful.  The group discussed a variety of healthier coping skills that could help with such a situation in the future.  Deep breathing was practiced briefly.  Therapeutic Goals: 1. Patients will remember their last incident of anger and how they felt emotionally and physically, what their thoughts were at the time, and how they behaved. 2. Patients will identify how their behavior at that time worked for them, as well as how it worked against them. 3. Patients will explore possible new behaviors to use in future anger situations. 4. Patients will learn that anger itself is normal and cannot be eliminated, and that healthier reactions can assist with resolving conflict rather than worsening situations.  Summary of Patient Progress:  The patient shared being disappointed with herself for allowing things get to the place where she needed to in the hospital. The patient pledged to try and do better, to utilize her coping skills and to stay engaged in treatment including taking her medications. She is able to articulate recognition of physical and emotional cues that are associated with anger and is willing to implement coping skills when she is faced with anger in the future. The patient recognizes that anger is a natural and normal human emotion.  Therapeutic Modalities:   Cognitive Behavioral Therapy  Elisabeth Pigeon, LCSW  [name]

## 2017-11-17 NOTE — Progress Notes (Signed)
D:  [name] was in her room much of the evening.  She reported that she had a pretty good day today but continues to verbalize depression and difficulty sleeping.  She did not attend evening wrap up group.  She denied SI/HI or A/V hallucinations.  She is minimal in her interactions but is pleasant and cooperative.   A:  1:1 with RN for support and encouragement.  Medications as ordered.  Q 15 minute checks maintained for safety.  Encouraged participation in group and unit activities.   R:  [name] remains safe on the unit.  We will continue to monitor the progress towards her goals.

## 2017-11-17 NOTE — Plan of Care (Signed)
Problem: Health Behavior/Discharge Planning: Goal: Compliance with therapeutic regimen will improve Outcome: Progressing  [name] has been attending groups and participation well.  We will continue to monitor the progress towards his goals.

## 2017-11-17 NOTE — Progress Notes (Signed)
Adult Psychoeducational Group Note  Date:  11/17/2017 Time:  9:21 PM  Group Topic/Focus:  Wrap-Up Group:   The focus of this group is to help patients review their daily goal of treatment and discuss progress on daily workbooks.  Participation Level:  Active  Participation Quality:  Appropriate  Affect:  Appropriate  Cognitive:  Appropriate  Insight: Appropriate  Engagement in Group:  Engaged  Modes of Intervention:  Discussion  Additional Comments:  Patient attended wrap-up group and participated.   [name] W [name] 06/[birth year], 9:21 PM

## 2017-11-17 NOTE — Plan of Care (Signed)
  Problem: Education: Goal: Emotional status will improve Outcome: Progressing   

## 2017-11-18 LAB — COMPREHENSIVE METABOLIC PANEL
ALBUMIN: 3.1 g/dL — AB (ref 3.5–5.0)
ALK PHOS: 79 U/L (ref 38–126)
ALT: 15 U/L (ref 0–44)
AST: 11 U/L — ABNORMAL LOW (ref 15–41)
Anion gap: 7 (ref 5–15)
BILIRUBIN TOTAL: 0.6 mg/dL (ref 0.3–1.2)
BUN: 12 mg/dL (ref 6–20)
CO2: 32 mmol/L (ref 22–32)
Calcium: 9.4 mg/dL (ref 8.9–10.3)
Chloride: 103 mmol/L (ref 98–111)
Creatinine, Ser: 0.72 mg/dL (ref 0.44–1.00)
GFR calc Af Amer: >60 mL/min (ref >60)
GFR calc non Af Amer: >60 mL/min (ref >60)
Glucose, Bld: 108 mg/dL — ABNORMAL HIGH (ref 70–99)
POTASSIUM: 4.2 mmol/L (ref 3.5–5.1)
Sodium: 142 mmol/L (ref 135–145)
TOTAL PROTEIN: 5.7 g/dL — AB (ref 6.5–8.1)

## 2017-11-18 LAB — CBC WITH DIFFERENTIAL/PLATELET
Basophils Absolute: 0 10*3/uL (ref 0.0–0.1)
Basophils Relative: 0 %
Eosinophils Absolute: 0.1 10*3/uL (ref 0.0–0.7)
Eosinophils Relative: 2 %
HEMATOCRIT: 43.9 % (ref 36.0–46.0)
Hemoglobin: 14.7 g/dL (ref 12.0–15.0)
LYMPHS ABS: 1.8 10*3/uL (ref 0.7–4.0)
LYMPHS PCT: 39 %
MCH: 32 pg (ref 26.0–34.0)
MCHC: 33.5 g/dL (ref 30.0–36.0)
MCV: 95.6 fL (ref 78.0–100.0)
MONO ABS: 0.4 10*3/uL (ref 0.1–1.0)
Monocytes Relative: 9 %
NEUTROS ABS: 2.3 10*3/uL (ref 1.7–7.7)
Neutrophils Relative %: 50 %
Platelets: 185 10*3/uL (ref 150–400)
RBC: 4.59 MIL/uL (ref 3.87–5.11)
RDW: 12.9 % (ref 11.5–15.5)
WBC: 4.5 10*3/uL (ref 4.0–10.5)

## 2017-11-18 LAB — VALPROIC ACID LEVEL: Valproic Acid Lvl: 61 ug/mL (ref 50.0–100.0)

## 2017-11-18 MED ORDER — QUETIAPINE FUMARATE 400 MG PO TABS
400.0000 mg | ORAL_TABLET | Freq: Every day | ORAL | Status: DC
  Administered 2017-11-18 – 2017-11-22 (×5): 400 mg via ORAL
  Filled 2017-11-18 (×7): qty 1

## 2017-11-18 MED ORDER — MELOXICAM 7.5 MG PO TABS
7.5000 mg | ORAL_TABLET | Freq: Every day | ORAL | Status: DC
  Administered 2017-11-18 – 2017-11-21 (×4): 7.5 mg via ORAL
  Filled 2017-11-18 (×7): qty 1

## 2017-11-18 MED ORDER — TRAZODONE HCL 50 MG PO TABS
50.0000 mg | ORAL_TABLET | Freq: Every evening | ORAL | Status: DC | PRN
  Administered 2017-11-18 – 2017-11-19 (×2): 50 mg via ORAL
  Filled 2017-11-18 (×2): qty 1

## 2017-11-18 NOTE — BHH Group Notes (Signed)
[place] LCSW Group Therapy Note  Date/Time:  11/18/2017  10:00-11:00AM  Type of Therapy and Topic:  Group Therapy:  Healthy and Unhealthy Supports  Participation Level:  Did Not Attend   Description of Group:  Patients in this group were introduced to the idea of adding a variety of healthy supports to address the various needs in their lives.Patients discussed what additional healthy supports could be helpful in their recovery and wellness after discharge in order to prevent future hospitalizations.   An emphasis was placed on using counselor, doctor, therapy groups, 12-step groups, and problem-specific support groups to expand supports.  They also worked as a group on developing a specific plan for several patients to deal with unhealthy supports through [place], psychoeducation with loved ones, and even termination of relationships.   Therapeutic Goals:   1)  discuss importance of adding supports to stay well once out of the hospital  2)  compare healthy versus unhealthy supports and identify some examples of each  3)  generate ideas and descriptions of healthy supports that can be added  4)  offer mutual support about how to address unhealthy supports  5)  encourage active participation in and adherence to discharge plan    Summary of Patient Progress:  The patient did not attend group. Therapeutic Modalities:   Motivational Interviewing Brief Solution-Focused Therapy  [name]

## 2017-11-18 NOTE — Progress Notes (Signed)
[facility] MD Progress Note  11/18/2017 11:22 AM [name]  MRN:  [id number] Subjective: Patient is seen and examined.  Patient is a 62 year old female with a probable past psychiatric history significant for bipolar disorder, alcohol use disorder and depression who is seen in follow-up.  She stated her mood was better.  She still not sleeping well.  She continues to have pain.  It is not as bad as it was yesterday.  She stated the Neurontin helped a bit, but was still in pain.  She also stated that the Flexeril was better than the Zanaflex.  The report was that she slept 6.75 hours, but she stated it was intermittently waking up.  This was because the pain.  We discussed increasing her Seroquel in case it would help with her sleep as well as mood stability.  We discussed the fact that she is going to get Depakote levels and labs done tomorrow so that we can adjust her Depakote if necessary. Principal Problem: <principal problem not specified> Diagnosis:   Patient Active Problem List   Diagnosis Date Noted  . MDD (major depressive disorder), recurrent severe, without psychosis ([place]) [F33.2] 11/14/2017  . Severe bipolar I disorder, most recent episode depressed ([place]) [F31.4]   . 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 Lung cancer ([place]) [C34.90] 04/24/2011   Total Time spent with patient: 20 minutes  Past Psychiatric History: See admission H&P  Past Medical History:  Past Medical History:  Diagnosis Date  . Allergy   . Depression   . Drug abuse ([place])    history of, went to rehab 2015  . GERD (gastroesophageal reflux disease)   . Lung cancer ([place])    lung ca dx 11/11- right upper lobe    Past Surgical History:  Procedure Laterality Date  . ANKLE FRACTURE SURGERY Left   . LUNG LOBECTOMY Right 2011   RUL removed for lung cancer  . STYLOID PROCESS EXCISION Left 10/16/2014   Procedure: EXCISION LEFT STYLOID PROCESS;  Surgeon:  Rozetta Nunnery, MD;  Location: [place];  Service: ENT;  Laterality: Left;  . TONSILLECTOMY Bilateral 10/16/2014   Procedure: BILATERAL TONSILLECTOMY;  Surgeon: Rozetta Nunnery, MD;  Location: [place];  Service: ENT;  Laterality: Bilateral;  . TUBAL LIGATION     Family History:  Family History  Problem Relation Age of Onset  . Cancer Mother   . Cancer Father    Family Psychiatric  History: See admission H&P Social History:  Social History   Substance and Sexual Activity  Alcohol Use No     Social History   Substance and Sexual Activity  Drug Use No    Social History   Socioeconomic History  . Marital status: Single    Spouse name: Not on file  . Number of children: Not on file  . Years of education: Not on file  . Highest education level: Not on file  Occupational History  . Not on file  Social Needs  . Financial resource strain: Not on file  . Food insecurity:    Worry: Not on file    Inability: Not on file  . Transportation needs:    Medical: Not on file    Non-medical: Not on file  Tobacco Use  . Smoking status: Former Smoker    Last attempt to quit: 05/15/2013    Years since quitting: 4.5  . Smokeless tobacco: Never Used  .  Tobacco comment: E-sig " pnce in a while"  Substance and Sexual Activity  . Alcohol use: No  . Drug use: No  . Sexual activity: Not on file  Lifestyle  . Physical activity:    Days per week: Not on file    Minutes per session: Not on file  . Stress: Not on file  Relationships  . Social connections:    Talks on phone: Not on file    Gets together: Not on file    Attends religious service: Not on file    Active member of club or organization: Not on file    Attends meetings of clubs or organizations: Not on file    Relationship status: Not on file  Other Topics Concern  . Not on file  Social History Narrative  . Not on file   Additional Social History:                          Sleep: Fair  Appetite:  Fair  Current Medications: Current Facility-Administered Medications  Medication Dose Route Frequency Provider Last Rate Last Dose  . acetaminophen (TYLENOL) tablet 650 mg  650 mg Oral Q6H PRN Nanci Pina, FNP   650 mg at 11/14/17 1657  . alum & mag hydroxide-simeth (MAALOX/MYLANTA) 200-200-20 MG/5ML suspension 30 mL  30 mL Oral Q4H PRN Starkes, Takia S, FNP      . cyclobenzaprine (FLEXERIL) tablet 5 mg  5 mg Oral TID PRN Sharma Covert, MD   5 mg at 11/17/17 1723  . divalproex (DEPAKOTE ER) 24 hr tablet 1,000 mg  1,000 mg Oral Daily Lindon Romp A, NP   1,000 mg at 11/18/17 0856  . gabapentin (NEURONTIN) capsule 200 mg  200 mg Oral TID Sharma Covert, MD   200 mg at 11/18/17 0856  . magnesium hydroxide (MILK OF MAGNESIA) suspension 30 mL  30 mL Oral Daily PRN Nanci Pina, FNP      . potassium chloride SA (K-DUR,KLOR-CON) CR tablet 40 mEq  40 mEq Oral Daily Lindon Romp A, NP   40 mEq at 11/18/17 0856  . QUEtiapine (SEROQUEL) tablet 300 mg  300 mg Oral QHS Sharma Covert, MD   300 mg at 11/17/17 2116  . traZODone (DESYREL) tablet 100 mg  100 mg Oral QHS PRN Sharma Covert, MD   100 mg at 11/17/17 2116    Lab Results:  Results for orders placed or performed during the hospital encounter of 11/14/17 (from the past 48 hour(s))  Urinalysis, Complete w Microscopic     Status: Abnormal   Collection Time: 11/16/17  7:00 PM  Result Value Ref Range   Color, Urine YELLOW YELLOW   APPearance CLEAR CLEAR   Specific Gravity, Urine 1.006 1.005 - 1.030   pH 7.0 5.0 - 8.0   Glucose, UA NEGATIVE NEGATIVE mg/dL   Hgb urine dipstick NEGATIVE NEGATIVE   Bilirubin Urine NEGATIVE NEGATIVE   Ketones, ur NEGATIVE NEGATIVE mg/dL   Protein, ur NEGATIVE NEGATIVE mg/dL   Nitrite POSITIVE (A) NEGATIVE   Leukocytes, UA TRACE (A) NEGATIVE   RBC / HPF 0-5 0 - 5 RBC/hpf   WBC, UA 11-20 0 - 5 WBC/hpf   Bacteria, UA RARE (A) NONE SEEN   Squamous Epithelial /  LPF 0-5 0 - 5   Mucus PRESENT     Comment: Performed at [facility], [place] [address] [place], [place] [address]  Comprehensive metabolic panel  Status: Abnormal   Collection Time: 11/18/17  6:04 AM  Result Value Ref Range   Sodium 142 135 - 145 mmol/L   Potassium 4.2 3.5 - 5.1 mmol/L   Chloride 103 98 - 111 mmol/L    Comment: Please note change in reference range.   CO2 32 22 - 32 mmol/L   Glucose, Bld 108 (H) 70 - 99 mg/dL    Comment: Please note change in reference range.   BUN 12 6 - 20 mg/dL    Comment: Please note change in reference range.   Creatinine, Ser 0.72 0.44 - 1.00 mg/dL   Calcium 9.4 8.9 - 10.3 mg/dL   Total Protein 5.7 (L) 6.5 - 8.1 g/dL   Albumin 3.1 (L) 3.5 - 5.0 g/dL   AST 11 (L) 15 - 41 U/L   ALT 15 0 - 44 U/L    Comment: Please note change in reference range.   Alkaline Phosphatase 79 38 - 126 U/L   Total Bilirubin 0.6 0.3 - 1.2 mg/dL   GFR calc non Af Amer >60 >60 mL/min   GFR calc Af Amer >60 >60 mL/min    Comment: (NOTE) The eGFR has been calculated using the CKD EPI equation. This calculation has not been validated in all clinical situations. eGFR's persistently <60 mL/min signify possible Chronic Kidney Disease.    Anion gap 7 5 - 15    Comment: Performed at [facility], [place] [address] [place], [place] [address]    Blood Alcohol level:  Lab Results  Component Value Date   ETH <10 11/13/2017   ETH 179 (H) 75/91/6384    Metabolic Disorder Labs: Lab Results  Component Value Date   HGBA1C 5.1 11/14/2017   MPG 99.67 11/14/2017   No results found for: PROLACTIN Lab Results  Component Value Date   CHOL 178 11/14/2017   TRIG 215 (H) 11/14/2017   HDL 29 (L) 11/14/2017   CHOLHDL 6.1 11/14/2017   VLDL 43 (H) 11/14/2017   LDLCALC 106 (H) 11/14/2017    Physical Findings: AIMS: Facial and Oral Movements Muscles of Facial Expression: None, normal Lips and Perioral Area: None, normal Jaw: None,  normal Tongue: None, normal,Extremity Movements Upper (arms, wrists, hands, fingers): None, normal Lower (legs, knees, ankles, toes): None, normal, Trunk Movements Neck, shoulders, hips: None, normal, Overall Severity Severity of abnormal movements (highest score from questions above): None, normal Incapacitation due to abnormal movements: None, normal Patient's awareness of abnormal movements (rate only patient's report): No Awareness, Dental Status Current problems with teeth and/or dentures?: No Does patient usually wear dentures?: No  CIWA:    COWS:     Musculoskeletal: Strength & Muscle Tone: within normal limits Gait & Station: normal Patient leans: N/A  Psychiatric Specialty Exam: Physical Exam  Constitutional: She is oriented to person, place, and time. She appears well-developed and well-nourished.  HENT:  Head: Normocephalic and atraumatic.  Respiratory: Effort normal.  Neurological: She is alert and oriented to person, place, and time.    ROS  Blood pressure (!) 75/65, pulse (!) 163, temperature 98 F (36.7 C), temperature source Oral, resp. rate 16, height '5\' 8"'$  (1.727 m), weight 71.2 kg (157 lb), SpO2 (!) 84 %.Body mass index is 23.87 kg/m.  General Appearance: Casual  Eye Contact:  Fair  Speech:  Normal Rate  Volume:  Decreased  Mood:  Depressed  Affect:  Congruent  Thought Process:  Coherent  Orientation:  Full (Time, Place, and Person)  Thought Content:  Logical  Suicidal Thoughts:  No  Homicidal Thoughts:  No  Memory:  Immediate;   Fair Recent;   Fair Remote;   Fair  Judgement:  Intact  Insight:  Fair  Psychomotor Activity:  Normal  Concentration:  Concentration: Fair and Attention Span: Fair  Recall:  [employer] of Knowledge:  Fair  Language:  Good  Akathisia:  Negative  Handed:  Right  AIMS (if indicated):     Assets:  Desire for Improvement Housing Resilience Social Support  ADL's:  Intact  Cognition:  WNL  Sleep:  Number of Hours: 6.5      Treatment Plan Summary: Daily contact with patient to assess and evaluate symptoms and progress in treatment, Medication management and Plan Patient is seen and examined.  Patient is a 62 year old female with the above-stated past psychiatric history seen in follow-up.  We will increase her Seroquel to 300 mg p.o. nightly.  We will try to consolidate this so that she does not have to take the trazodone.  She did state she felt lethargic this morning with the combination.  I am going to increase her Neurontin to 200 mg p.o. 3 times daily.  She has a Depakote level ordered for the a.m. on Sunday.  We will review those labs.  No other changes in her current medications.  Sharma Covert, MD 11/18/2017, 11:22 AM

## 2017-11-18 NOTE — Progress Notes (Signed)
Patient ID: [name], female   DOB: May 24, 1955, 62 y.o.   MRN: [id number] DAR Note: Pt observed in room; remained in bed. Pt at assessment endorsed moderate anxiety and depression. Pt denied pain, SI/HI or AVH, "I want to feel better and that's why I'm here." Pt does not look to be in any distress at this time. All patient's questions and concerns addressed. Support, encouragement, and safe environment provided. 15-minute safety checks continue.

## 2017-11-18 NOTE — Progress Notes (Signed)
Adult Psychoeducational Group Note  Date:  11/18/2017 Time:  10:37 AM  Did not attend group.  [name] 11/18/2017, 10:37 AM

## 2017-11-18 NOTE — Progress Notes (Addendum)
[facility] MD Progress Note  11/18/2017 11:32 AM [name]  MRN:  [id number] Subjective: Patient is seen and examined.  Patient is a 62 year old female with a past psychiatric history significant for bipolar disorder, most recently depressed to seen in follow-up.  She states she felt more depressed today.  She stated her pain has not changed.  She stated she took the increased dose of Seroquel as well as the trazodone and she still had several times where she woke up in the night.  She continues to complain of pain.  We discussed whether or not she never been on nonsteroidal anti-inflammatory medications.  Her kidney functions normal.  She did state that her labs were drawn this morning, and the only thing gets back so far are her liver function enzymes and they are normal right now.  Nursing notes show that she slept approximately 6.5 hours last night.  She did admit that she had been taking some naps during the day.  She thought the Neurontin might be making her sleepy.  We discussed the possibility of increasing it, but after she admitted to feeling more sleepy with that we left alone.  Her vital signs show that she had a heart rate of 163 this morning.  I am not sure that is correct.  Earlier in the a.m. her heart rate was 87 but she still remains somewhat hypotensive.  She was 79/70 at 16: 32 and 75/65 at 16: 33. Principal Problem: <principal problem not specified> Diagnosis:   Patient Active Problem List   Diagnosis Date Noted  . MDD (major depressive disorder), recurrent severe, without psychosis ([place]) [F33.2] 11/14/2017  . Severe bipolar I disorder, most recent episode depressed ([place]) [F31.4]   . 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 Lung cancer ([place]) [C34.90] 04/24/2011   Total Time spent with patient: 20 minutes  Past Psychiatric History: See admission H&P  Past Medical History:  Past Medical History:  Diagnosis Date  .  Allergy   . Depression   . Drug abuse ([place])    history of, went to rehab 2015  . GERD (gastroesophageal reflux disease)   . Lung cancer ([place])    lung ca dx 11/11- right upper lobe    Past Surgical History:  Procedure Laterality Date  . ANKLE FRACTURE SURGERY Left   . LUNG LOBECTOMY Right 2011   RUL removed for lung cancer  . STYLOID PROCESS EXCISION Left 10/16/2014   Procedure: EXCISION LEFT STYLOID PROCESS;  Surgeon: Rozetta Nunnery, MD;  Location: [place];  Service: ENT;  Laterality: Left;  . TONSILLECTOMY Bilateral 10/16/2014   Procedure: BILATERAL TONSILLECTOMY;  Surgeon: Rozetta Nunnery, MD;  Location: [place];  Service: ENT;  Laterality: Bilateral;  . TUBAL LIGATION     Family History:  Family History  Problem Relation Age of Onset  . Cancer Mother   . Cancer Father    Family Psychiatric  History: See admission H&P Social History:  Social History   Substance and Sexual Activity  Alcohol Use No     Social History   Substance and Sexual Activity  Drug Use No    Social History   Socioeconomic History  . Marital status: Single    Spouse name: Not on file  . Number of children: Not on file  . Years of education: Not on file  . Highest education level: Not on file  Occupational History  . Not  on file  Social Needs  . Financial resource strain: Not on file  . Food insecurity:    Worry: Not on file    Inability: Not on file  . Transportation needs:    Medical: Not on file    Non-medical: Not on file  Tobacco Use  . Smoking status: Former Smoker    Last attempt to quit: 05/15/2013    Years since quitting: 4.5  . Smokeless tobacco: Never Used  . Tobacco comment: E-sig " pnce in a while"  Substance and Sexual Activity  . Alcohol use: No  . Drug use: No  . Sexual activity: Not on file  Lifestyle  . Physical activity:    Days per week: Not on file    Minutes per session: Not on file  . Stress: Not on file   Relationships  . Social connections:    Talks on phone: Not on file    Gets together: Not on file    Attends religious service: Not on file    Active member of club or organization: Not on file    Attends meetings of clubs or organizations: Not on file    Relationship status: Not on file  Other Topics Concern  . Not on file  Social History Narrative  . Not on file   Additional Social History:                         Sleep: Fair  Appetite:  Fair  Current Medications: Current Facility-Administered Medications  Medication Dose Route Frequency Provider Last Rate Last Dose  . acetaminophen (TYLENOL) tablet 650 mg  650 mg Oral Q6H PRN Nanci Pina, FNP   650 mg at 11/14/17 1657  . alum & mag hydroxide-simeth (MAALOX/MYLANTA) 200-200-20 MG/5ML suspension 30 mL  30 mL Oral Q4H PRN Starkes, Takia S, FNP      . cyclobenzaprine (FLEXERIL) tablet 5 mg  5 mg Oral TID PRN Sharma Covert, MD   5 mg at 11/17/17 1723  . divalproex (DEPAKOTE ER) 24 hr tablet 1,000 mg  1,000 mg Oral Daily Lindon Romp A, NP   1,000 mg at 11/18/17 0856  . gabapentin (NEURONTIN) capsule 200 mg  200 mg Oral TID Sharma Covert, MD   200 mg at 11/18/17 0856  . magnesium hydroxide (MILK OF MAGNESIA) suspension 30 mL  30 mL Oral Daily PRN Starkes, Takia S, FNP      . meloxicam (MOBIC) tablet 7.5 mg  7.5 mg Oral Daily Sharma Covert, MD      . potassium chloride SA (K-DUR,KLOR-CON) CR tablet 40 mEq  40 mEq Oral Daily Lindon Romp A, NP   40 mEq at 11/18/17 0856  . QUEtiapine (SEROQUEL) tablet 400 mg  400 mg Oral QHS Sharma Covert, MD      . traZODone (DESYREL) tablet 50 mg  50 mg Oral QHS PRN Sharma Covert, MD        Lab Results:  Results for orders placed or performed during the hospital encounter of 11/14/17 (from the past 48 hour(s))  Urinalysis, Complete w Microscopic     Status: Abnormal   Collection Time: 11/16/17  7:00 PM  Result Value Ref Range   Color, Urine YELLOW YELLOW    APPearance CLEAR CLEAR   Specific Gravity, Urine 1.006 1.005 - 1.030   pH 7.0 5.0 - 8.0   Glucose, UA NEGATIVE NEGATIVE mg/dL   Hgb urine dipstick NEGATIVE NEGATIVE  Bilirubin Urine NEGATIVE NEGATIVE   Ketones, ur NEGATIVE NEGATIVE mg/dL   Protein, ur NEGATIVE NEGATIVE mg/dL   Nitrite POSITIVE (A) NEGATIVE   Leukocytes, UA TRACE (A) NEGATIVE   RBC / HPF 0-5 0 - 5 RBC/hpf   WBC, UA 11-20 0 - 5 WBC/hpf   Bacteria, UA RARE (A) NONE SEEN   Squamous Epithelial / LPF 0-5 0 - 5   Mucus PRESENT     Comment: Performed at [facility], [place] [address], [place], [place] [address]  Comprehensive metabolic panel     Status: Abnormal   Collection Time: 11/18/17  6:04 AM  Result Value Ref Range   Sodium 142 135 - 145 mmol/L   Potassium 4.2 3.5 - 5.1 mmol/L   Chloride 103 98 - 111 mmol/L    Comment: Please note change in reference range.   CO2 32 22 - 32 mmol/L   Glucose, Bld 108 (H) 70 - 99 mg/dL    Comment: Please note change in reference range.   BUN 12 6 - 20 mg/dL    Comment: Please note change in reference range.   Creatinine, Ser 0.72 0.44 - 1.00 mg/dL   Calcium 9.4 8.9 - 10.3 mg/dL   Total Protein 5.7 (L) 6.5 - 8.1 g/dL   Albumin 3.1 (L) 3.5 - 5.0 g/dL   AST 11 (L) 15 - 41 U/L   ALT 15 0 - 44 U/L    Comment: Please note change in reference range.   Alkaline Phosphatase 79 38 - 126 U/L   Total Bilirubin 0.6 0.3 - 1.2 mg/dL   GFR calc non Af Amer >60 >60 mL/min   GFR calc Af Amer >60 >60 mL/min    Comment: (NOTE) The eGFR has been calculated using the CKD EPI equation. This calculation has not been validated in all clinical situations. eGFR's persistently <60 mL/min signify possible Chronic Kidney Disease.    Anion gap 7 5 - 15    Comment: Performed at [facility], [place] [address] [place], [place] [address]    Blood Alcohol level:  Lab Results  Component Value Date   ETH <10 11/13/2017   ETH 179 (H) 63/87/5643    Metabolic  Disorder Labs: Lab Results  Component Value Date   HGBA1C 5.1 11/14/2017   MPG 99.67 11/14/2017   No results found for: PROLACTIN Lab Results  Component Value Date   CHOL 178 11/14/2017   TRIG 215 (H) 11/14/2017   HDL 29 (L) 11/14/2017   CHOLHDL 6.1 11/14/2017   VLDL 43 (H) 11/14/2017   LDLCALC 106 (H) 11/14/2017    Physical Findings: AIMS: Facial and Oral Movements Muscles of Facial Expression: None, normal Lips and Perioral Area: None, normal Jaw: None, normal Tongue: None, normal,Extremity Movements Upper (arms, wrists, hands, fingers): None, normal Lower (legs, knees, ankles, toes): None, normal, Trunk Movements Neck, shoulders, hips: None, normal, Overall Severity Severity of abnormal movements (highest score from questions above): None, normal Incapacitation due to abnormal movements: None, normal Patient's awareness of abnormal movements (rate only patient's report): No Awareness, Dental Status Current problems with teeth and/or dentures?: No Does patient usually wear dentures?: No  CIWA:    COWS:     Musculoskeletal: Strength & Muscle Tone: within normal limits Gait & Station: normal Patient leans: N/A  Psychiatric Specialty Exam: Physical Exam  Nursing note and vitals reviewed. Constitutional: She is oriented to person, place, and time. She appears well-developed and well-nourished.  HENT:  Head: Normocephalic and  atraumatic.  Respiratory: Effort normal.  Neurological: She is alert and oriented to person, place, and time.    ROS  Blood pressure (!) 75/65, pulse (!) 163, temperature 98 F (36.7 C), temperature source Oral, resp. rate 16, height '5\' 8"'$  (1.727 m), weight 71.2 kg (157 lb), SpO2 (!) 84 %.Body mass index is 23.87 kg/m.  General Appearance: Casual  Eye Contact:  Fair  Speech:  Slow  Volume:  Decreased  Mood:  Depressed  Affect:  Congruent  Thought Process:  Coherent  Orientation:  Full (Time, Place, and Person)  Thought Content:  Logical   Suicidal Thoughts:  Yes.  without intent/plan  Homicidal Thoughts:  No  Memory:  Immediate;   Fair Recent;   Fair Remote;   Fair  Judgement:  Intact  Insight:  Fair  Psychomotor Activity:  Psychomotor Retardation  Concentration:  Concentration: Fair and Attention Span: Fair  Recall:  [employer] of Knowledge:  Fair  Language:  Fair  Akathisia:  Negative  Handed:  Right  AIMS (if indicated):     Assets:  Communication Skills Desire for Improvement Housing Resilience Social Support  ADL's:  Intact  Cognition:  WNL  Sleep:  Number of Hours: 6.5     Treatment Plan Summary: Daily contact with patient to assess and evaluate symptoms and progress in treatment, Medication management and Plan Patient is seen and examined.  Patient is a 62 year old female with the above-stated past psychiatric history who was seen in follow-up.  She states she feels more depressed today.  The increase in the Seroquel did not really help her sleep a great deal.  I am going to increase her Seroquel to 400 mg p.o. nightly.  She is sleeping during the day, so I am going to reduce her trazodone down to 50 mg p.o. nightly as needed.  She continues to have back issues, so I am going to add Mobic 7.5 mg p.o. daily.  Hopefully that will help.  I will continue the Flexeril for now, but it appears as though it is making her more sleepy during the day we may have to stop that.  We discussed possibly increasing the Neurontin, but she feels like it might be making her sleepy during the day.  Her Depakote level still has not come back yet, but her liver function enzymes are back and they are normal.  It stated in the chart she had a heart rate of 163 this morning, and I am going to order an EKG just to assess that.  I think it might of been a mistake.  She remains hypotensive at this point.  But not symptomatic except for fatigue.  Sharma Covert, MD 11/18/2017, 11:32 AM   Addendum-EKG was obtained.  Her ventricular rate  was 82.  It was a normal sinus rhythm.  Her QT/QTc was 350/408.

## 2017-11-18 NOTE — Progress Notes (Signed)
Adult Psychoeducational Group Note  Date:  11/18/2017 Time:  10:10 PM  Group Topic/Focus:  Wrap-Up Group:   The focus of this group is to help patients review their daily goal of treatment and discuss progress on daily workbooks.  Participation Level:  Did Not Attend  Participation Quality:    Affect:    Cognitive:    Insight:   Engagement in Group:  Modes of Intervention:   Additional Comments:  Pt was invited to participate in group but declined.  Maniyah Moller 11/18/2017, 10:10 PM

## 2017-11-19 NOTE — Tx Team (Signed)
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 acetaminophen (TYLENOL) tablet 650 mg  650 mg Oral Q6H PRN Nanci Pina, FNP   650 mg at 11/14/17 1657  . alum & mag hydroxide-simeth (MAALOX/MYLANTA) 200-200-20 MG/5ML suspension 30 mL  30 mL Oral Q4H PRN Starkes, Takia S, FNP      . cyclobenzaprine (FLEXERIL) tablet 5 mg  5 mg Oral TID PRN Sharma Covert, MD   5 mg at 11/18/17 1319  . divalproex (DEPAKOTE ER) 24 hr tablet 1,000 mg  1,000 mg Oral Daily Lindon Romp A, NP   1,000 mg at 11/19/17 0816  . gabapentin (NEURONTIN) capsule 200 mg  200 mg Oral TID Sharma Covert, MD   200 mg at 11/19/17 1210  . magnesium hydroxide (MILK OF MAGNESIA) suspension 30 mL  30 mL Oral Daily PRN Starkes, Takia S, FNP      . meloxicam (MOBIC) tablet 7.5 mg  7.5 mg Oral Daily Sharma Covert, MD   7.5 mg at 11/19/17 0816  . potassium chloride SA (K-DUR,KLOR-CON) CR tablet 40 mEq  40 mEq Oral Daily Lindon Romp A, NP   40 mEq at 11/19/17 0816  . QUEtiapine (SEROQUEL) tablet 400 mg  400 mg Oral QHS Sharma Covert, MD   400 mg at 11/18/17 2204  . traZODone (DESYREL) tablet 50 mg  50 mg Oral QHS PRN Sharma Covert, MD   50 mg at 11/18/17 2204   PTA Medications: Medications Prior to Admission  Medication Sig Dispense Refill Last Dose  . trazodone (DESYREL) 300 MG tablet Take 300 mg by mouth at bedtime.     . divalproex (DEPAKOTE ER) 500 MG 24 hr tablet Take 1,000 mg by mouth daily.  1 11/13/2017 at Unknown time  . potassium chloride SA (K-DUR,KLOR-CON)  20 MEQ tablet Take 2 tablets (40 mEq total) by mouth daily. 6 tablet 0   . venlafaxine XR (EFFEXOR-XR) 37.5 MG 24 hr capsule Take 37.5 mg by mouth daily.  1 11/13/2017 at Unknown time    Patient Stressors: Financial difficulties Medication change or noncompliance  Patient Strengths: Capable of independent living Curator fund of knowledge Motivation for treatment/growth Work skills  Treatment Modalities: Medication Management, Group therapy, Case management,  1 to 1 session with clinician, Psychoeducation, Recreational therapy.   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 Long Term Goal(s): Improvement in symptoms so as ready for discharge Improvement in symptoms so as ready for discharge   Short Term Goals: Ability to identify changes in lifestyle to reduce recurrence of condition will improve Ability to verbalize feelings will improve Ability to disclose and discuss suicidal ideas Ability to demonstrate self-control will improve Ability to identify and develop effective coping behaviors will improve Ability to maintain clinical measurements within normal limits will improve Compliance with prescribed medications will improve Ability to identify changes in lifestyle to reduce recurrence of condition will improve Ability to verbalize feelings will improve Ability to disclose and discuss suicidal ideas Ability to demonstrate self-control will improve Ability to identify and develop effective coping behaviors will improve Ability to maintain  clinical measurements within normal limits will improve Compliance with prescribed medications will improve  Medication Management: Evaluate patient's response, side effects, and tolerance of medication regimen.  Therapeutic Interventions: 1 to 1 sessions, Unit Group sessions and Medication administration.  Evaluation of Outcomes: Progressing   Physician Treatment Plan for  Secondary Diagnosis: Active Problems:   MDD (major depressive disorder), recurrent severe, without psychosis ([name])   Severe bipolar I disorder, most recent episode depressed ([name])  Long Term Goal(s): Improvement in symptoms so as ready for discharge Improvement in symptoms so as ready for discharge   Short Term Goals: Ability to identify changes in lifestyle to reduce recurrence of condition will improve Ability to verbalize feelings will improve Ability to disclose and discuss suicidal ideas Ability to demonstrate self-control will improve Ability to identify and develop effective coping behaviors will improve Ability to maintain clinical measurements within normal limits will improve Compliance with prescribed medications will improve Ability to identify changes in lifestyle to reduce recurrence of condition will improve Ability to verbalize feelings will improve Ability to disclose and discuss suicidal ideas Ability to demonstrate self-control will improve Ability to identify and develop effective coping behaviors will improve Ability to maintain clinical measurements within normal limits will improve Compliance with prescribed medications will improve     Medication Management: Evaluate patient's response, side effects, and tolerance of medication regimen.  Therapeutic Interventions: 1 to 1 sessions, Unit Group sessions and Medication administration.  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 RN will report any adverse and/or side effects to prescribing provider.  Therapeutic Interventions: 1 on 1 counseling sessions, Psychoeducation, Medication administration, Evaluate responses to treatment, Monitor vital signs and CBGs as ordered, Perform/monitor CIWA, COWS, AIMS and Fall Risk screenings as ordered, Perform wound care treatments as ordered.  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  Short Term Goals: Engage patient in aftercare planning with referrals and resources  Therapeutic Interventions: Assess for all discharge needs, 1 to 1 time with Social worker, Explore available resources and support systems, Assess for adequacy in community support network, Educate family and significant other(s) on suicide prevention, Complete Psychosocial Assessment, Interpersonal group therapy.  Evaluation of Outcomes: Progressing  Progress in Treatment: Attending groups: Yes. Participating in groups: Yes. Taking medication as prescribed: Yes. Toleration medication: Yes. Family/Significant other contact made: SPE completed with pt's sister.  Patient understands diagnosis: Yes. Discussing patient identified problems/goals with staff: Yes. Medical problems stabilized or resolved: Yes. Denies suicidal/homicidal ideation: Yes. Issues/concerns per patient self-inventory: No. Other:   New problem(s) identified:None  New Short Term/Long Term Goal(s):medication stabilization, elimination of SI thoughts, development of comprehensive mental wellness plan.    Patient Goals:  "I need help with my medications, depression, appetite and not being able to sleep"  Discharge Plan or Barriers: Pt has appt at [facility] for Thursday, 7/11. [place] pamphlet, Mobile Crisis information, and AA/NA information  provided to patient for additional community support and resources.   Reason for Continuation of Hospitalization: Anxiety Depression Medication stabilization  Estimated Length of Stay: Wed, 11/21/17  Attendees: Patient:  11/19/2017 1:45 PM  Physician: Dr. Myles Lipps, MD 11/19/2017 1:45 PM  Nursing: Sharl Ma RN; Chrys Racer RN 11/19/2017 1:45 PM  RN Care Manager: Rhunette Croft 11/19/2017 1:45 PM  Social Worker: Janice Norrie LCSW 11/19/2017 1:45 PM  Recreational Therapist: Rhunette Croft 11/19/2017 1:45 PM  Other: Saul Fordyce NP; Ricky Ala NP 11/19/2017 1:45 PM  Other: X 11/19/2017 1:45 PM  Other:X 11/19/2017 1:45 PM    Scribe for Treatment Team: Avelina Laine, LCSW 11/19/2017 1:45 PM

## 2017-11-19 NOTE — Plan of Care (Signed)
Patient verbalizes understanding of information, education provided. 

## 2017-11-19 NOTE — Progress Notes (Signed)
D: Patient observed in dayroom this evening. Requested hs medications at 2100. Forwards minimal information. Patient states "I had a so-so day. I would rate my depression at about a 7/10." Patient's affect flat, sad, and depressed with congruent mood.  Denies pain, physical complaints.   A: Medicated per orders, prn trazadone given per her request. Medication education provided. Level III obs in place for safety. Emotional support offered. Patient encouraged to complete Suicide Safety Plan before discharge. Encouraged to attend and participate in unit programming.   R: Patient verbalizes understanding of POC. On reassess, patient is asleep. Patient denies SI/HI/AVH and remains safe on level III obs. Will continue to monitor throughout the night.

## 2017-11-19 NOTE — Progress Notes (Signed)
Recreation Therapy Notes  Date: 7.8.19 Time: 0930 Location: 300 Hall Dayroom  Group Topic: Stress Management  Goal Area(s) Addresses:  Patient will verbalize importance of using healthy stress management.  Patient will identify positive emotions associated with healthy stress management.   Intervention: Stress Management  Activity :  Meditation.  LRT introduced the stress management technique of meditation.  LRT played a meditation on choice.  Patients were to listen and follow along as meditation played.  Education:  Stress Management, Discharge Planning.   Education Outcome: Acknowledges edcuation/In group clarification offered/Needs additional education  Clinical Observations/Feedback:  Pt did not attend group.     Victorino Sparrow, LRT/CTRS         Ria Comment, Johannes Everage A 11/19/2017 11:57 AM

## 2017-11-19 NOTE — Progress Notes (Signed)
Patient ID: [name], female   DOB: 1956/05/12, 62 y.o.   MRN: [id number] D: Patient observed in her room. Pt reports her day was well. Pt reports her medication is helping with her mood. Pt rated her depression and anxiety as 5 on 0-10 scale. Pt reports her goal is to feel better. Pt attended evening wrap up group and engaged in discussion. Denies  SI/HI/AVH and pain.No behavioral issues noted.  A: Support and encouragement offered as needed to express needs. Medications administered as prescribed.  R: Patient is safe and cooperative on unit. Will continue to monitor  for safety and stability.

## 2017-11-19 NOTE — Progress Notes (Signed)
[facility] MD Progress Note  11/19/2017 1:03 PM [name]  MRN:  [id number] Subjective: Patient is seen and examined.  Patient is a 62 year old female with a past psychiatric history significant for bipolar disorder, most recently depressed.  She is seen in follow-up.  She states she feels about 50% better today.  She stated that she slept well last night with the Seroquel, and that her pain seems to be a little bit better with the meloxicam.  She denied any suicidal ideation.  We discussed antidepressants that she may have taken in the past.  She did state that she felt as though they had made her manic in the past.  We discussed leaving her medications alone and allowing her mood to improve with these most recent changes.  She is in agreement with that.  We were concerned about her heart rate yesterday.  She had an EKG last night and the previous numbers were reflected in the notes.  It was basically normal.  Denied any suicidal ideation to day. Principal Problem: <principal problem not specified> Diagnosis:   Patient Active Problem List   Diagnosis Date Noted  . MDD (major depressive disorder), recurrent severe, without psychosis ([place]) [F33.2] 11/14/2017  . Severe bipolar I disorder, most recent episode depressed ([place]) [F31.4]   . 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 Lung cancer ([place]) [C34.90] 04/24/2011   Total Time spent with patient: 20 minutes  Past Psychiatric History: See admission H&P  Past Medical History:  Past Medical History:  Diagnosis Date  . Allergy   . Depression   . Drug abuse ([place])    history of, went to rehab 2015  . GERD (gastroesophageal reflux disease)   . Lung cancer ([place])    lung ca dx 11/11- right upper lobe    Past Surgical History:  Procedure Laterality Date  . ANKLE FRACTURE SURGERY Left   . LUNG LOBECTOMY Right 2011   RUL removed for lung cancer  . STYLOID PROCESS EXCISION Left 10/16/2014    Procedure: EXCISION LEFT STYLOID PROCESS;  Surgeon: Rozetta Nunnery, MD;  Location: [facility];  Service: ENT;  Laterality: Left;  . TONSILLECTOMY Bilateral 10/16/2014   Procedure: BILATERAL TONSILLECTOMY;  Surgeon: Rozetta Nunnery, MD;  Location: [facility];  Service: ENT;  Laterality: Bilateral;  . TUBAL LIGATION     Family History:  Family History  Problem Relation Age of Onset  . Cancer Mother   . Cancer Father    Family Psychiatric  History: See admission H&P Social History:  Social History   Substance and Sexual Activity  Alcohol Use No     Social History   Substance and Sexual Activity  Drug Use No    Social History   Socioeconomic History  . Marital status: Single    Spouse name: Not on file  . Number of children: Not on file  . Years of education: Not on file  . Highest education level: Not on file  Occupational History  . Not on file  Social Needs  . Financial resource strain: Not on file  . Food insecurity:    Worry: Not on file    Inability: Not on file  . Transportation needs:    Medical: Not on file    Non-medical: Not on file  Tobacco Use  . Smoking status: Former Smoker    Last attempt to quit: 05/15/2013    Years since quitting: 4.5  .  Smokeless tobacco: Never Used  . Tobacco comment: E-sig " pnce in a while"  Substance and Sexual Activity  . Alcohol use: No  . Drug use: No  . Sexual activity: Not on file  Lifestyle  . Physical activity:    Days per week: Not on file    Minutes per session: Not on file  . Stress: Not on file  Relationships  . Social connections:    Talks on phone: Not on file    Gets together: Not on file    Attends religious service: Not on file    Active member of club or organization: Not on file    Attends meetings of clubs or organizations: Not on file    Relationship status: Not on file  Other Topics Concern  . Not on file  Social History Narrative  . Not on file    Additional Social History:                         Sleep: Good  Appetite:  Fair  Current Medications: Current Facility-Administered Medications  Medication Dose Route Frequency Provider Last Rate Last Dose  . acetaminophen (TYLENOL) tablet 650 mg  650 mg Oral Q6H PRN Nanci Pina, FNP   650 mg at 11/14/17 1657  . alum & mag hydroxide-simeth (MAALOX/MYLANTA) 200-200-20 MG/5ML suspension 30 mL  30 mL Oral Q4H PRN Starkes, Takia S, FNP      . cyclobenzaprine (FLEXERIL) tablet 5 mg  5 mg Oral TID PRN Sharma Covert, MD   5 mg at 11/18/17 1319  . divalproex (DEPAKOTE ER) 24 hr tablet 1,000 mg  1,000 mg Oral Daily Lindon Romp A, NP   1,000 mg at 11/19/17 0816  . gabapentin (NEURONTIN) capsule 200 mg  200 mg Oral TID Sharma Covert, MD   200 mg at 11/19/17 1210  . magnesium hydroxide (MILK OF MAGNESIA) suspension 30 mL  30 mL Oral Daily PRN Starkes, Takia S, FNP      . meloxicam (MOBIC) tablet 7.5 mg  7.5 mg Oral Daily Sharma Covert, MD   7.5 mg at 11/19/17 0816  . potassium chloride SA (K-DUR,KLOR-CON) CR tablet 40 mEq  40 mEq Oral Daily Lindon Romp A, NP   40 mEq at 11/19/17 0816  . QUEtiapine (SEROQUEL) tablet 400 mg  400 mg Oral QHS Sharma Covert, MD   400 mg at 11/18/17 2204  . traZODone (DESYREL) tablet 50 mg  50 mg Oral QHS PRN Sharma Covert, MD   50 mg at 11/18/17 2204    Lab Results:  Results for orders placed or performed during the hospital encounter of 11/14/17 (from the past 48 hour(s))  Comprehensive metabolic panel     Status: Abnormal   Collection Time: 11/18/17  6:04 AM  Result Value Ref Range   Sodium 142 135 - 145 mmol/L   Potassium 4.2 3.5 - 5.1 mmol/L   Chloride 103 98 - 111 mmol/L    Comment: Please note change in reference range.   CO2 32 22 - 32 mmol/L   Glucose, Bld 108 (H) 70 - 99 mg/dL    Comment: Please note change in reference range.   BUN 12 6 - 20 mg/dL    Comment: Please note change in reference range.    Creatinine, Ser 0.72 0.44 - 1.00 mg/dL   Calcium 9.4 8.9 - 10.3 mg/dL   Total Protein 5.7 (L) 6.5 - 8.1 g/dL   Albumin  3.1 (L) 3.5 - 5.0 g/dL   AST 11 (L) 15 - 41 U/L   ALT 15 0 - 44 U/L    Comment: Please note change in reference range.   Alkaline Phosphatase 79 38 - 126 U/L   Total Bilirubin 0.6 0.3 - 1.2 mg/dL   GFR calc non Af Amer >60 >60 mL/min   GFR calc Af Amer >60 >60 mL/min    Comment: (NOTE) The eGFR has been calculated using the CKD EPI equation. This calculation has not been validated in all clinical situations. eGFR's persistently <60 mL/min signify possible Chronic Kidney Disease.    Anion gap 7 5 - 15    Comment: Performed at [facility], [place] [address] [place], [place] [address]  Valproic acid level     Status: None   Collection Time: 11/18/17  7:00 PM  Result Value Ref Range   Valproic Acid Lvl 61 50.0 - 100.0 ug/mL    Comment: Performed at [facility], [place] [address], [place], [place] [address]  CBC with Differential/Platelet     Status: None   Collection Time: 11/18/17  7:00 PM  Result Value Ref Range   WBC 4.5 4.0 - 10.5 K/uL   RBC 4.59 3.87 - 5.11 MIL/uL   Hemoglobin 14.7 12.0 - 15.0 g/dL   HCT 43.9 36.0 - 46.0 %   MCV 95.6 78.0 - 100.0 fL   MCH 32.0 26.0 - 34.0 pg   MCHC 33.5 30.0 - 36.0 g/dL   RDW 12.9 11.5 - 15.5 %   Platelets 185 150 - 400 K/uL   Neutrophils Relative % 50 %   Neutro Abs 2.3 1.7 - 7.7 K/uL   Lymphocytes Relative 39 %   Lymphs Abs 1.8 0.7 - 4.0 K/uL   Monocytes Relative 9 %   Monocytes Absolute 0.4 0.1 - 1.0 K/uL   Eosinophils Relative 2 %   Eosinophils Absolute 0.1 0.0 - 0.7 K/uL   Basophils Relative 0 %   Basophils Absolute 0.0 0.0 - 0.1 K/uL    Comment: Performed at [facility], [place] [address], [place], [place] [address]    Blood Alcohol level:  Lab Results  Component Value Date   ETH <10 11/13/2017   ETH 179 (H) 27/74/1287    Metabolic Disorder Labs: Lab  Results  Component Value Date   HGBA1C 5.1 11/14/2017   MPG 99.67 11/14/2017   No results found for: PROLACTIN Lab Results  Component Value Date   CHOL 178 11/14/2017   TRIG 215 (H) 11/14/2017   HDL 29 (L) 11/14/2017   CHOLHDL 6.1 11/14/2017   VLDL 43 (H) 11/14/2017   LDLCALC 106 (H) 11/14/2017    Physical Findings: AIMS: Facial and Oral Movements Muscles of Facial Expression: None, normal Lips and Perioral Area: None, normal Jaw: None, normal Tongue: None, normal,Extremity Movements Upper (arms, wrists, hands, fingers): None, normal Lower (legs, knees, ankles, toes): None, normal, Trunk Movements Neck, shoulders, hips: None, normal, Overall Severity Severity of abnormal movements (highest score from questions above): None, normal Incapacitation due to abnormal movements: None, normal Patient's awareness of abnormal movements (rate only patient's report): No Awareness, Dental Status Current problems with teeth and/or dentures?: No Does patient usually wear dentures?: No  CIWA:    COWS:     Musculoskeletal: Strength & Muscle Tone: within normal limits Gait & Station: normal Patient leans: N/A  Psychiatric Specialty Exam: Physical Exam  Nursing note and vitals reviewed. Constitutional: She is oriented to  person, place, and time. She appears well-developed and well-nourished.  HENT:  Head: Normocephalic and atraumatic.  Respiratory: Effort normal.  Neurological: She is alert and oriented to person, place, and time.    ROS  Blood pressure (!) 126/110, pulse 95, temperature 97.6 F (36.4 C), temperature source Oral, resp. rate 16, height '5\' 8"'$  (1.727 m), weight 71.2 kg (157 lb), SpO2 (!) 84 %.Body mass index is 23.87 kg/m.  General Appearance: Casual  Eye Contact:  Fair  Speech:  Normal Rate  Volume:  Normal  Mood:  Depressed  Affect:  Congruent  Thought Process:  Coherent  Orientation:  Full (Time, Place, and Person)  Thought Content:  Logical  Suicidal  Thoughts:  No  Homicidal Thoughts:  No  Memory:  Immediate;   Fair Recent;   Fair Remote;   Fair  Judgement:  Intact  Insight:  Fair  Psychomotor Activity:  Normal  Concentration:  Concentration: Fair and Attention Span: Fair  Recall:  [employer] of Knowledge:  Fair  Language:  Fair  Akathisia:  Negative  Handed:  Right  AIMS (if indicated):     Assets:  Communication Skills Desire for Improvement Housing Resilience Social Support  ADL's:  Intact  Cognition:  WNL  Sleep:  Number of Hours: 6.75     Treatment Plan Summary: Daily contact with patient to assess and evaluate symptoms and progress in treatment, Medication management and Plan Patient is seen and examined.  Patient is a 62 year old female with the above-stated past psychiatric history seen in follow-up.  She is about 50% improved from admission.  Her mood is improving.  Her sleep is improving.  She is not suicidal.  I am not going to change any of her medications today.  Hopefully she will continue to improve.  Her blood pressure in the chart today is elevated, and I want to make sure that that is repeated to confirm it.  Her Depakote level 61.  Her CBC and LFTs are normal.  Sharma Covert, MD 11/19/2017, 1:03 PM

## 2017-11-19 NOTE — Progress Notes (Signed)
Pt presents with a flat affect and depressed mood. Pt reports increased depression and decreased anxiety this morning. Pt denies any stressors to increased depression and expressed "I just feel depressed".  Pt denies SI/HI. Pt reports improved sleep last night without any issues. Pt noted to be withdrawn and isolative to her room. Pt reports isolating in her room and not attending groups. Writer encouraged pt to attend scheduled groups throughout the day.   Orders reviewed with pt. V/s and labs assessed. Verbal support provided. Pt encouraged to attend groups. Pt encouraged to identify coping skills for depression. 15 minute checks performed for safety.  Pt compliant with meds and denies any side effects.

## 2017-11-20 DIAGNOSIS — Z87891 Personal history of nicotine dependence: Secondary | ICD-10-CM

## 2017-11-20 DIAGNOSIS — F419 Anxiety disorder, unspecified: Secondary | ICD-10-CM

## 2017-11-20 DIAGNOSIS — G4700 Insomnia, unspecified: Secondary | ICD-10-CM

## 2017-11-20 NOTE — Progress Notes (Addendum)
[facility] MD Progress Note  11/20/2017 1:21 PM JACQUALINE WEICHEL  MRN:  [id number] Subjective: Patient reports she is feeling " a little better".  Objective : I have reviewed chart notes and have met with patient. Patient is a 62 year old female, single, lives with brother. Reports history of Bipolar Disorder in the past, with prior admission at another psychiatric unit in February for mood episode .She presented to ED due to worsening depression, suicidal ideations in the context of medication non compliance x several weeks. Denies suicidal ideations . Reports she has had night time " hallucinations", such as hearing her sister come up to her bed- states these are new and have occurred only when falling asleep or waking up from sleep, which she states has been fair. She does not endorse daytime hallucinations, and does not currently present internally preoccupied. Description consistent with hypnagogic, hypnopompic experiences. She has been going to groups, visible on unit, and states she feels she has been " talking " a little more, feeling more sociable .  Principal Problem: Reports history of Bipolar Disorder/Depressed  Diagnosis:   Patient Active Problem List   Diagnosis Date Noted  . MDD (major depressive disorder), recurrent severe, without psychosis ([place]) [F33.2] 11/14/2017  . Severe bipolar I disorder, most recent episode depressed ([place]) [F31.4]   . 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 Lung cancer ([place]) [C34.90] 04/24/2011   Total Time spent with patient: 20 minutes  Past Psychiatric History: See admission H&P  Past Medical History:  Past Medical History:  Diagnosis Date  . Allergy   . Depression   . Drug abuse ([place])    history of, went to rehab 2015  . GERD (gastroesophageal reflux disease)   . Lung cancer ([place])    lung ca dx 11/11- right upper lobe    Past Surgical History:  Procedure Laterality Date  . ANKLE  FRACTURE SURGERY Left   . LUNG LOBECTOMY Right 2011   RUL removed for lung cancer  . STYLOID PROCESS EXCISION Left 10/16/2014   Procedure: EXCISION LEFT STYLOID PROCESS;  Surgeon: Rozetta Nunnery, MD;  Location: [place];  Service: ENT;  Laterality: Left;  . TONSILLECTOMY Bilateral 10/16/2014   Procedure: BILATERAL TONSILLECTOMY;  Surgeon: Rozetta Nunnery, MD;  Location: [place];  Service: ENT;  Laterality: Bilateral;  . TUBAL LIGATION     Family History:  Family History  Problem Relation Age of Onset  . Cancer Mother   . Cancer Father    Family Psychiatric  History: See admission H&P Social History:  Social History   Substance and Sexual Activity  Alcohol Use No     Social History   Substance and Sexual Activity  Drug Use No    Social History   Socioeconomic History  . Marital status: Single    Spouse name: Not on file  . Number of children: Not on file  . Years of education: Not on file  . Highest education level: Not on file  Occupational History  . Not on file  Social Needs  . Financial resource strain: Not on file  . Food insecurity:    Worry: Not on file    Inability: Not on file  . Transportation needs:    Medical: Not on file    Non-medical: Not on file  Tobacco Use  . Smoking status: Former Smoker    Last attempt to quit: 05/15/2013    Years since  quitting: 4.5  . Smokeless tobacco: Never Used  . Tobacco comment: E-sig " pnce in a while"  Substance and Sexual Activity  . Alcohol use: No  . Drug use: No  . Sexual activity: Not on file  Lifestyle  . Physical activity:    Days per week: Not on file    Minutes per session: Not on file  . Stress: Not on file  Relationships  . Social connections:    Talks on phone: Not on file    Gets together: Not on file    Attends religious service: Not on file    Active member of club or organization: Not on file    Attends meetings of clubs or organizations: Not on file     Relationship status: Not on file  Other Topics Concern  . Not on file  Social History Narrative  . Not on file   Additional Social History:   Sleep: Fair  Appetite:  Fair  Current Medications: Current Facility-Administered Medications  Medication Dose Route Frequency Provider Last Rate Last Dose  . acetaminophen (TYLENOL) tablet 650 mg  650 mg Oral Q6H PRN Nanci Pina, FNP   650 mg at 11/19/17 1646  . alum & mag hydroxide-simeth (MAALOX/MYLANTA) 200-200-20 MG/5ML suspension 30 mL  30 mL Oral Q4H PRN Starkes, Takia S, FNP      . cyclobenzaprine (FLEXERIL) tablet 5 mg  5 mg Oral TID PRN Sharma Covert, MD   5 mg at 11/20/17 1157  . divalproex (DEPAKOTE ER) 24 hr tablet 1,000 mg  1,000 mg Oral Daily Lindon Romp A, NP   1,000 mg at 11/20/17 0821  . gabapentin (NEURONTIN) capsule 200 mg  200 mg Oral TID Sharma Covert, MD   200 mg at 11/20/17 1156  . magnesium hydroxide (MILK OF MAGNESIA) suspension 30 mL  30 mL Oral Daily PRN Starkes, Takia S, FNP      . meloxicam (MOBIC) tablet 7.5 mg  7.5 mg Oral Daily Sharma Covert, MD   7.5 mg at 11/20/17 4098  . potassium chloride SA (K-DUR,KLOR-CON) CR tablet 40 mEq  40 mEq Oral Daily Lindon Romp A, NP   40 mEq at 11/20/17 0821  . QUEtiapine (SEROQUEL) tablet 400 mg  400 mg Oral QHS Sharma Covert, MD   400 mg at 11/19/17 2101  . traZODone (DESYREL) tablet 50 mg  50 mg Oral QHS PRN Sharma Covert, MD   50 mg at 11/19/17 2102    Lab Results:  Results for orders placed or performed during the hospital encounter of 11/14/17 (from the past 48 hour(s))  Valproic acid level     Status: None   Collection Time: 11/18/17  7:00 PM  Result Value Ref [place]   Valproic Acid Lvl 61 50.0 - 100.0 ug/mL    Comment: Performed at [facility], [place] [address] [place], [place] [address]  CBC with Differential/Platelet     Status: None   Collection Time: 11/18/17  7:00 PM  Result Value Ref [place]   WBC 4.5 4.0 - 10.5  K/uL   RBC 4.59 3.87 - 5.11 MIL/uL   Hemoglobin 14.7 12.0 - 15.0 g/dL   HCT 43.9 36.0 - 46.0 %   MCV 95.6 78.0 - 100.0 fL   MCH 32.0 26.0 - 34.0 pg   MCHC 33.5 30.0 - 36.0 g/dL   RDW 12.9 11.5 - 15.5 %   Platelets 185 150 - 400 K/uL   Neutrophils Relative % 50 %  Neutro Abs 2.3 1.7 - 7.7 K/uL   Lymphocytes Relative 39 %   Lymphs Abs 1.8 0.7 - 4.0 K/uL   Monocytes Relative 9 %   Monocytes Absolute 0.4 0.1 - 1.0 K/uL   Eosinophils Relative 2 %   Eosinophils Absolute 0.1 0.0 - 0.7 K/uL   Basophils Relative 0 %   Basophils Absolute 0.0 0.0 - 0.1 K/uL    Comment: Performed at [facility], [place] [address] [place], [place] [address]    Blood Alcohol level:  Lab Results  Component Value Date   ETH <10 11/13/2017   ETH 179 (H) 70/26/3785    Metabolic Disorder Labs: Lab Results  Component Value Date   HGBA1C 5.1 11/14/2017   MPG 99.67 11/14/2017   No results found for: PROLACTIN Lab Results  Component Value Date   CHOL 178 11/14/2017   TRIG 215 (H) 11/14/2017   HDL 29 (L) 11/14/2017   CHOLHDL 6.1 11/14/2017   VLDL 43 (H) 11/14/2017   LDLCALC 106 (H) 11/14/2017    Physical Findings: AIMS: Facial and Oral Movements Muscles of Facial Expression: None, normal Lips and Perioral Area: None, normal Jaw: None, normal Tongue: None, normal,Extremity Movements Upper (arms, wrists, hands, fingers): None, normal Lower (legs, knees, ankles, toes): None, normal, Trunk Movements Neck, shoulders, hips: None, normal, Overall Severity Severity of abnormal movements (highest score from questions above): None, normal Incapacitation due to abnormal movements: None, normal Patient's awareness of abnormal movements (rate only patient's report): No Awareness, Dental Status Current problems with teeth and/or dentures?: No Does patient usually wear dentures?: No  CIWA:    COWS:     Musculoskeletal: Strength & Muscle Tone: within normal limits Gait & Station:  normal Patient leans: N/A  Psychiatric Specialty Exam: Physical Exam  Nursing note and vitals reviewed. Constitutional: She is oriented to person, place, and time. She appears well-developed and well-nourished.  HENT:  Head: Normocephalic and atraumatic.  Respiratory: Effort normal.  Neurological: She is alert and oriented to person, place, and time.    ROS denies current headache, no chest pain, no shortness of breath, no vomiting , no rash   Blood pressure 92/66, pulse 83, temperature 98.1 F (36.7 C), temperature source Oral, resp. rate 20, height '5\' 8"'$  (1.727 m), weight 71.2 kg (157 lb), SpO2 (!) 84 %.Body mass index is 23.87 kg/m.  General Appearance: Casual  Eye Contact:  Good  Speech:  Normal Rate  Volume:  Normal  Mood:  reports she is feeling partially better   Affect:  Appropriate and reactive, currently not presenting irritable or dysphoric   Thought Process:  Linear and Descriptions of Associations: Intact  Orientation:  Full (Time, Place, and Person)  Thought Content:  reports night time brief hallucinations " when half asleep", no current hallucinations, no delusions, not internally preoccupied   Suicidal Thoughts:  No denies suicidal or self injurious ideations, denies homicidal or violent ideations.   Homicidal Thoughts:  No  Memory: recent and remote grossly intact  Judgement:  Other:  improving   Insight:  improving   Psychomotor Activity:  Normal- no psychomotor agitation  Concentration:  Concentration: Good and Attention Span: Good  Recall:  Good  Fund of Knowledge:  Good  Language:  Good  Akathisia:  Negative  Handed:  Right  AIMS (if indicated):     Assets:  Communication Skills Desire for Improvement Housing Resilience Social Support  ADL's:  Intact  Cognition:  WNL  Sleep:  Number of Hours: 6  Assessment - 62 year old single female, lives with a brother, has a prior history of Bipolar Disorder diagnosis, presented with worsening depression and  suicidal thoughts, currently partially improved, feeling better but still vaguely depressed. Denies current suicidal plan or intention. Denies current medication side effects.   Treatment Plan Summary: Treatment Plan reviewed as below today 7/9  Continue Seroquel 400 mgrs QHS for mood disorder, which she states she took in the past without side effects Continue Depakote ER 1000 mgrs QDAY for mood disorder  Continue Neurontin 200 mgrs TID for anxiety  D/C Trazodone PRN as patient's night time vivid dreams and description of fleeting  hallucinatory type experiences on awakening could be side effect.  Treatment Team working on disposition planning options Check Lipid panel and HgbA1C as on Seroquel   Jenne Campus, MD 11/20/2017, 1:21 PM  Patient ID: [name], female   DOB: 18-Jan-1956, 62 y.o.   MRN: [id number]

## 2017-11-20 NOTE — [facility] Group Notes (Signed)
LCSW Group Therapy Note 11/20/2017 11:27 AM  Type of Therapy and Topic: Group Therapy: Overcoming Obstacles  Participation Level: Active  Description of Group:  In this group patients will be encouraged to explore what they see as obstacles to their own wellness and recovery. They will be guided to discuss their thoughts, feelings, and behaviors related to these obstacles. The group will process together ways to cope with barriers, with attention given to specific choices patients can make. Each patient will be challenged to identify changes they are motivated to make in order to overcome their obstacles. This group will be process-oriented, with patients participating in exploration of their own experiences as well as giving and receiving support and challenge from other group members.  Therapeutic Goals: 1. Patient will identify personal and current obstacles as they relate to admission. 2. Patient will identify barriers that currently interfere with their wellness or overcoming obstacles.  3. Patient will identify feelings, thought process and behaviors related to these barriers. 4. Patient will identify two changes they are willing to make to overcome these obstacles:   Summary of Patient Progress  [name] was engaged and participated throughout the group session. [name] states that her current obstacles are her continuous hallucinations and her alcoholic brother with whom she lives with. [name] states that she does not know how she plans to overcome living with her alcoholic brother at this time, however she does plans to remain compliant with her medications in hopes that it eliminates her hallucinations.      Therapeutic Modalities:  Cognitive Behavioral Therapy Solution Focused Therapy Motivational Interviewing Relapse Prevention Therapy   Theresa Duty Clinical Social Worker

## 2017-11-20 NOTE — Progress Notes (Signed)
DAR NOTE: Patient presents with bright  affect and pleasant mood.  Pt has been visible in the milieu interacting with peers. Pt stated she had a fair night sleep, fair appetite, low energy , and good concentration. Pt stated she was seeing things last night, seeing her family member in the room with her last night. Complained of legs pain pain, visual , denies SI.  Rates depression at 5, hopelessness at 6, and anxiety at 5.  Maintaned on routine safety checks.  Medications given as prescribed.  Support and encouragement offered as needed.  States goal for today is " get better and be able to go home."  Patient observed socializing with peers in the dayroom.  Offered no complaint.

## 2017-11-21 MED ORDER — ONDANSETRON HCL 4 MG PO TABS
4.0000 mg | ORAL_TABLET | Freq: Four times a day (QID) | ORAL | Status: DC | PRN
  Administered 2017-11-21: 4 mg via ORAL
  Filled 2017-11-21: qty 1

## 2017-11-21 MED ORDER — VENLAFAXINE HCL ER 37.5 MG PO CP24
37.5000 mg | ORAL_CAPSULE | Freq: Every day | ORAL | Status: DC
  Administered 2017-11-22 – 2017-11-23 (×2): 37.5 mg via ORAL
  Filled 2017-11-21 (×3): qty 1

## 2017-11-21 NOTE — Progress Notes (Signed)
Patient hypotensive this morning, complaining of dizziness. Gatorade provided and encouraged to hydrate. Labs rescheduled as patient does not feel able to come up to phlebotomy room, even with assist. Will recheck VS manually after patient pushes fluids.

## 2017-11-21 NOTE — Progress Notes (Signed)
[facility] MD Progress Note  11/21/2017 6:06 PM [name]  MRN:  [id number] Subjective: She reports she continues to feel "pretty depressed".  Describes an ongoing sense of sadness, depression, some anxiety.  Today does not report or describe hallucinations and does not present with any psychotic symptoms.  She ruminates about her psychosocial stressors and in particular about living with her brother whom she describes as alcoholic and "very difficult to live with".  She states that he has agreed to leave the house but is unsure when he will do so.  States that in the past when he has been away her mood tends to improve. Currently denies medication side effects. Denies suicidal ideations. Objective : I have discussed case with treatment team and have met with patient. Patient is a 62 year old female, single, lives with brother. Reports history of Bipolar Disorder in the past, with prior admission at another psychiatric unit in February for mood episode .She presented to ED due to worsening depression, suicidal ideations in the context of medication non compliance x several weeks.  As above, patient reports ongoing depression and anxiety .  Tends to ruminate about her home situation and in particular about her brother being there when she discharges.  States that he is alcoholic and is difficult to manage/tolerate when he is drinking-attributes her depression to a significant degree to this stressor.  She is hopeful that he will move out of the house which she has told her he will do. Denies medication side effects. Denies suicidal ideations at this time. Behavior on unit in good control, visible on unit  Principal Problem: Reports history of Bipolar Disorder/Depressed  Diagnosis:   Patient Active Problem List   Diagnosis Date Noted  . MDD (major depressive disorder), recurrent severe, without psychosis ([place]) [F33.2] 11/14/2017  . Severe bipolar I disorder, most recent episode depressed ([place]) [F31.4]    . 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 Lung cancer ([place]) [C34.90] 04/24/2011   Total Time spent with patient: 20 minutes  Past Psychiatric History: See admission H&P  Past Medical History:  Past Medical History:  Diagnosis Date  . Allergy   . Depression   . Drug abuse ([place])    history of, went to rehab 2015  . GERD (gastroesophageal reflux disease)   . Lung cancer ([place])    lung ca dx 11/11- right upper lobe    Past Surgical History:  Procedure Laterality Date  . ANKLE FRACTURE SURGERY Left   . LUNG LOBECTOMY Right 2011   RUL removed for lung cancer  . STYLOID PROCESS EXCISION Left 10/16/2014   Procedure: EXCISION LEFT STYLOID PROCESS;  Surgeon: Rozetta Nunnery, MD;  Location: [place];  Service: ENT;  Laterality: Left;  . TONSILLECTOMY Bilateral 10/16/2014   Procedure: BILATERAL TONSILLECTOMY;  Surgeon: Rozetta Nunnery, MD;  Location: [place];  Service: ENT;  Laterality: Bilateral;  . TUBAL LIGATION     Family History:  Family History  Problem Relation Age of Onset  . Cancer Mother   . Cancer Father    Family Psychiatric  History: See admission H&P Social History:  Social History   Substance and Sexual Activity  Alcohol Use No     Social History   Substance and Sexual Activity  Drug Use No    Social History   Socioeconomic History  . Marital status: Single    Spouse name: Not on file  . Number  of children: Not on file  . Years of education: Not on file  . Highest education level: Not on file  Occupational History  . Not on file  Social Needs  . Financial resource strain: Not on file  . Food insecurity:    Worry: Not on file    Inability: Not on file  . Transportation needs:    Medical: Not on file    Non-medical: Not on file  Tobacco Use  . Smoking status: Former Smoker    Last attempt to quit: 05/15/2013    Years since quitting: 4.5  .  Smokeless tobacco: Never Used  . Tobacco comment: E-sig " pnce in a while"  Substance and Sexual Activity  . Alcohol use: No  . Drug use: No  . Sexual activity: Not on file  Lifestyle  . Physical activity:    Days per week: Not on file    Minutes per session: Not on file  . Stress: Not on file  Relationships  . Social connections:    Talks on phone: Not on file    Gets together: Not on file    Attends religious service: Not on file    Active member of club or organization: Not on file    Attends meetings of clubs or organizations: Not on file    Relationship status: Not on file  Other Topics Concern  . Not on file  Social History Narrative  . Not on file   Additional Social History:   Sleep: Improving  Appetite:  Fair  Current Medications: Current Facility-Administered Medications  Medication Dose Route Frequency Provider Last Rate Last Dose  . acetaminophen (TYLENOL) tablet 650 mg  650 mg Oral Q6H PRN Nanci Pina, FNP   650 mg at 11/19/17 1646  . alum & mag hydroxide-simeth (MAALOX/MYLANTA) 200-200-20 MG/5ML suspension 30 mL  30 mL Oral Q4H PRN Lavina Hamman, Takia S, FNP      . divalproex (DEPAKOTE ER) 24 hr tablet 1,000 mg  1,000 mg Oral Daily Lindon Romp A, NP   1,000 mg at 11/21/17 1328  . gabapentin (NEURONTIN) capsule 200 mg  200 mg Oral TID Sharma Covert, MD   200 mg at 11/21/17 1629  . magnesium hydroxide (MILK OF MAGNESIA) suspension 30 mL  30 mL Oral Daily PRN Nanci Pina, FNP      . ondansetron [facility]) tablet 4 mg  4 mg Oral Q6H PRN Mordecai Maes, NP   4 mg at 11/21/17 1202  . potassium chloride SA (K-DUR,KLOR-CON) CR tablet 40 mEq  40 mEq Oral Daily Lindon Romp A, NP   40 mEq at 11/21/17 1331  . QUEtiapine (SEROQUEL) tablet 400 mg  400 mg Oral QHS Sharma Covert, MD   400 mg at 11/20/17 2103  . [START ON 11/22/2017] venlafaxine XR (EFFEXOR-XR) 24 hr capsule 37.5 mg  37.5 mg Oral Q breakfast Cobos, Myer Peer, MD        Lab Results:  No  results found for this or any previous visit (from the past 48 hour(s)).  Blood Alcohol level:  Lab Results  Component Value Date   ETH <10 11/13/2017   ETH 179 (H) 63/89/3734    Metabolic Disorder Labs: Lab Results  Component Value Date   HGBA1C 5.1 11/14/2017   MPG 99.67 11/14/2017   No results found for: PROLACTIN Lab Results  Component Value Date   CHOL 178 11/14/2017   TRIG 215 (H) 11/14/2017   HDL 29 (L) 11/14/2017   CHOLHDL  6.1 11/14/2017   VLDL 43 (H) 11/14/2017   LDLCALC 106 (H) 11/14/2017    Physical Findings: AIMS: Facial and Oral Movements Muscles of Facial Expression: None, normal Lips and Perioral Area: None, normal Jaw: None, normal Tongue: None, normal,Extremity Movements Upper (arms, wrists, hands, fingers): None, normal Lower (legs, knees, ankles, toes): None, normal, Trunk Movements Neck, shoulders, hips: None, normal, Overall Severity Severity of abnormal movements (highest score from questions above): None, normal Incapacitation due to abnormal movements: None, normal Patient's awareness of abnormal movements (rate only patient's report): No Awareness, Dental Status Current problems with teeth and/or dentures?: No Does patient usually wear dentures?: No  CIWA:  CIWA-Ar Total: 1 COWS:  COWS Total Score: 1  Musculoskeletal: Strength & Muscle Tone: within normal limits Gait & Station: normal Patient leans: N/A  Psychiatric Specialty Exam: Physical Exam  Nursing note and vitals reviewed. Constitutional: She is oriented to person, place, and time. She appears well-developed and well-nourished.  HENT:  Head: Normocephalic and atraumatic.  Respiratory: Effort normal.  Neurological: She is alert and oriented to person, place, and time.    ROS denies current headache, no chest pain, no shortness of breath, no vomiting , no rash   Blood pressure 118/85, pulse 79, temperature 97.8 F (36.6 C), temperature source Oral, resp. rate 16, height _0   (1.727 m), weight 71.2 kg (157 lb), SpO2 (!) 84 %.Body mass index is 23.87 kg/m.  General Appearance: Fairly Groomed  Eye Contact:  Good  Speech:  Normal Rate  Volume:  Normal  Mood:  Describes ongoing depression  Affect:  Congruent, vaguely anxious  Thought Process:  Linear and Descriptions of Associations: Intact  Orientation:  Full (Time, Place, and Person)  Thought Content:  No hallucinations, no delusions, not internally preoccupied  Suicidal Thoughts:  No denies suicidal or self injurious ideations, denies homicidal or violent ideations.  Specifically also denies any homicidal or violent ideations towards her brother  Homicidal Thoughts:  No  Memory: recent and remote grossly intact  Judgement:  Other:  improving   Insight:  improving   Psychomotor Activity:  Normal- no psychomotor agitation  Concentration:  Concentration: Good and Attention Span: Good  Recall:  Good  Fund of Knowledge:  Good  Language:  Good  Akathisia:  Negative  Handed:  Right  AIMS (if indicated):     Assets:  Communication Skills Desire for Improvement Housing Resilience Social Support  ADL's:  Intact  Cognition:  WNL  Sleep:  Number of Hours: 6.75   Assessment -patient presents with persistent depression/some anxiety which at this time she attributes mostly to her psychosocial stressors.  States she lives with her brother was alcoholic which leads to significant stress and tension.  She is happy that he has told her he will vacate the house, but ruminates about returning and him still being the.  Tolerating medications well, denies side effects. We have discussed medication options-patient has been diagnosed with bipolar disorder and is on Depakote and Seroquel.  In the past she has been prescribed Effexor which she states was well-tolerated and helped her feel better.  Treatment Plan Summary: Treatment Plan reviewed as below today 7/10 Continue Seroquel 400 mgrs QHS for mood disorder Continue  Depakote ER 1000 mgrs QDAY for mood disorder  Continue Neurontin 200 mgrs TID for anxiety  Start Effexor XR 37.5 mgrs QDAY for depression and anxiety Treatment Team working on disposition planning options HgbA1C, Lipid Panel ordered, pending  Jenne Campus, MD 11/21/2017, 6:06 PM  Patient ID: [name], female   DOB: Oct 27, 1955, 62 y.o.   MRN: [id number]

## 2017-11-21 NOTE — Progress Notes (Signed)
Recreation Therapy Notes  Date: 7.10.19 Time: 0930 Location: 300 Hall Dayroom  Group Topic: Stress Management  Goal Area(s) Addresses:  Patient will verbalize importance of using healthy stress management.  Patient will identify positive emotions associated with healthy stress management.   Intervention: Stress Management  Activity :  Guided Imagery.  LRT introduced the stress management technique of guided imagery.  LRT read a script to allow patients to visualize being outside on a bright summer day.  Patients were to follow along as script was read.  Education:  Stress Management, Discharge Planning.   Education Outcome: Acknowledges edcuation/In group clarification offered/Needs additional education  Clinical Observations/Feedback: Pt did not attend group.    Victorino Sparrow, LRT/CTRS          Victorino Sparrow A 11/21/2017 12:06 PM

## 2017-11-21 NOTE — BHH Group Notes (Signed)
[facility] Mental Health Association Group Therapy 11/21/2017 1:15pm  Type of Therapy: Mental Health Association Presentation  Participation Level: Invited. Chose to remain in bed.   Avelina Laine, LCSW 11/21/2017 2:48 PM

## 2017-11-21 NOTE — Plan of Care (Signed)
Nurse discussed anxiety, depression, coping skills with patient. 

## 2017-11-21 NOTE — Progress Notes (Signed)
Patient has not taken morning medications this morning.  Patient stated she vomited, has been laying in bed.  Patient did get out of bed about 1200 and took zofran.  Respirations even and unlabored.  No signs/symptoms of pain/distress noted on patient's face/body movements.

## 2017-11-21 NOTE — Plan of Care (Signed)
Patient up and visible in the milieu. Asking about groups as she wants to attend programming.  Patient frustrated at lack of groups today and is able to appropriately convey her frustration.

## 2017-11-21 NOTE — Progress Notes (Signed)
Patient ID: [name], female   DOB: 07-19-1955, 62 y.o.   MRN: [id number] D: Patient quiet and keep to herself on the unit. Pt reports she is feeling better. Pt rated depression and anxiety as 6 on 0-10 scale. Pt reports she is tolerating medication well without any side effect. Pt reports her goal is to feel better. Pt attended evening wrap up group and engaged in discussion. Denies  SI/HI/AVH and pain.No behavioral issues noted.  A: Support and encouragement offered as needed to express needs. Medications administered as prescribed.  R: Patient is safe and cooperative on unit. Will continue to monitor  for safety and stability.

## 2017-11-21 NOTE — Therapy (Signed)
Occupational Therapy Group Note  Date:  11/21/2017 Time:  2:51 PM  Group Topic/Focus:  Stress Management  Participation Level:  Active  Participation Quality:  Appropriate  Affect:  Depressed and Flat  Cognitive:  Appropriate  Insight: Improving  Engagement in Group:  Engaged  Modes of Intervention:  Activity, Discussion, Education and Socialization  Additional Comments:    S: "Deep breathing is really effective for me" O: Stress management group completed to use as productive coping strategy, to help mitigate maladaptive coping to integrate in functional BADL/IADL when reintegrating into community. Stress management tools worksheet completed to identify negative coping mechanisms and their short and long term effects vs positive coping mechanisms with demonstration. Coping strategies taught include: relaxation based- deep breathing, counting to 10, taking a 1 minute vacation, acceptance, stress balls, relaxation audio/video, visual/mental imagery. Positive mental attitude- gratitude, acceptance, cognitive reframing, positive self talk, anger management.  Gratitude journaling handout and instruction also given. Adult coloring and relaxation tips worksheet given at end of session.   A: Pt presents to group with flat affect, engaged and participatory throughout session. Stress management tools worksheet completed, pt states she often suppresses her stress until she "explodes". Pt shows interest in exploring relaxation methods. Pt expresses enjoyment in deep breathing to continue to manage stress positively this date. Pt eager to acquire coloring handout at end of session to continue this practice as well.   P: Pt provided with education on stress management activities to implement into daily routine. Handouts given to facilitate carryover when reintegrating into community     [facility], [place], OTR/L   [employer] 11/21/2017, 2:51 PM

## 2017-11-21 NOTE — Progress Notes (Signed)
D:   Patient denied SI and HI, contracts for safety.  Denied A/V hallucinations.   A:  Medications administered per MD orders.  Emotional support and encouragement given patient. R:  Safety maintained with 15 minute checks.  Patient stated she is feeling better this afternoon and took her morning medications after lunch. Patient has been seen talking, smiling with peers/staff this afternoon.

## 2017-11-21 NOTE — Progress Notes (Signed)
D: Patient observed up and interacting with peers at start of shift. Came up for HS meds and became frustrated that her trazadone had been discontinued. Patient also reports "no groups happened today. It's giving you all a bad name." Patient's affect animated, anxious with labile mood.   Denies pain, physical complaints.   A: Medicated per orders, no prns given as patient no longer has trazadone ordered. Medication education provided. Explained to patient why it was discontinued. Processed frustrations with patient. Level III obs in place for safety. Emotional support offered. Suicide Safety Plan completed and on chart. Fall prevention plan in place and reviewed with patient as pt is a moderate fall risk due to age/scoring.   R: Patient verbalizes understanding of POC, falls prevention education. On 1:1 time spent with this Probation officer, patient calmer and appreciative. Patient denies SI/HI/AVH and remains safe on level III obs. Will continue to monitor throughout the night.

## 2017-11-22 LAB — HEMOGLOBIN A1C
HEMOGLOBIN A1C: 5.4 % (ref 4.8–5.6)
Mean Plasma Glucose: 108.28 mg/dL

## 2017-11-22 LAB — CBC WITH DIFFERENTIAL/PLATELET
BASOS PCT: 0 %
Basophils Absolute: 0 10*3/uL (ref 0.0–0.1)
EOS ABS: 0.1 10*3/uL (ref 0.0–0.7)
EOS PCT: 1 %
HCT: 43.5 % (ref 36.0–46.0)
HEMOGLOBIN: 14.5 g/dL (ref 12.0–15.0)
Lymphocytes Relative: 14 %
Lymphs Abs: 1.1 10*3/uL (ref 0.7–4.0)
MCH: 31.6 pg (ref 26.0–34.0)
MCHC: 33.3 g/dL (ref 30.0–36.0)
MCV: 94.8 fL (ref 78.0–100.0)
MONO ABS: 0.7 10*3/uL (ref 0.1–1.0)
MONOS PCT: 9 %
NEUTROS PCT: 76 %
Neutro Abs: 5.7 10*3/uL (ref 1.7–7.7)
PLATELETS: 167 10*3/uL (ref 150–400)
RBC: 4.59 MIL/uL (ref 3.87–5.11)
RDW: 13 % (ref 11.5–15.5)
WBC: 7.5 10*3/uL (ref 4.0–10.5)

## 2017-11-22 LAB — BASIC METABOLIC PANEL
Anion gap: 8 (ref 5–15)
BUN: 10 mg/dL (ref 6–20)
CALCIUM: 8.9 mg/dL (ref 8.9–10.3)
CO2: 30 mmol/L (ref 22–32)
CREATININE: 0.89 mg/dL (ref 0.44–1.00)
Chloride: 101 mmol/L (ref 98–111)
GFR calc non Af Amer: >60 mL/min (ref >60)
Glucose, Bld: 146 mg/dL — ABNORMAL HIGH (ref 70–99)
Potassium: 4.5 mmol/L (ref 3.5–5.1)
Sodium: 139 mmol/L (ref 135–145)

## 2017-11-22 LAB — LIPID PANEL
CHOL/HDL RATIO: 5.9 ratio
CHOLESTEROL: 287 mg/dL — AB (ref 0–200)
HDL: 49 mg/dL (ref >40)
LDL Cholesterol: 198 mg/dL — ABNORMAL HIGH (ref 0–99)
Triglycerides: 198 mg/dL — ABNORMAL HIGH (ref <150)
VLDL: 40 mg/dL (ref 0–40)

## 2017-11-22 MED ORDER — LOPERAMIDE HCL 2 MG PO CAPS
2.0000 mg | ORAL_CAPSULE | ORAL | Status: DC | PRN
  Administered 2017-11-22 – 2017-11-28 (×3): 2 mg via ORAL
  Filled 2017-11-22 (×4): qty 1

## 2017-11-22 NOTE — Progress Notes (Signed)
Adult Psychoeducational Group Note  Date:  11/22/2017 Time:  8:33 PM  Group Topic/Focus:  Wrap-Up Group:   The focus of this group is to help patients review their daily goal of treatment and discuss progress on daily workbooks.  Participation Level:  Active  Participation Quality:  Appropriate  Affect:  Appropriate  Cognitive:  Alert and Oriented  Insight: Appropriate  Engagement in Group:  Improving  Modes of Intervention:  Exploration and Support  Additional Comments:  Pt rated her day a 7. Pt verbalized something positive is that she was working on going home.  Pt verbalized that one positive coping skill that she learned is the rubber band technique.   Hallie Ertl, Patrick North 11/22/2017, 8:33 PM

## 2017-11-22 NOTE — BHH Group Notes (Signed)
[facility] Group Notes:  (Nursing/MHT/Case Management/Adjunct)  Date:  11/22/2017  Time:  5:08 PM  Type of Therapy:  Psychoeducational Skills  Participation Level:  Active  Participation Quality:  Appropriate  Affect:  Appropriate  Cognitive:  Alert  Insight:  Appropriate  Engagement in Group:  Engaged and Supportive  Modes of Intervention:  Activity, Socialization and Support  Summary of Progress/Problems: Patient actively participated and was receptive.  Coralyn Mark Demichael Traum 11/22/2017, 5:08 PM

## 2017-11-22 NOTE — BHH Group Notes (Signed)
[facility] LCSW Group Therapy Note  Date/Time: 11/22/17, 1315  Type of Therapy/Topic:  Group Therapy:  Balance in Life  Participation Level:  moderate  Description of Group:    This group will address the concept of balance and how it feels and looks when one is unbalanced. Patients will be encouraged to process areas in their lives that are out of balance, and identify reasons for remaining unbalanced. Facilitators will guide patients utilizing problem- solving interventions to address and correct the stressor making their life unbalanced. Understanding and applying boundaries will be explored and addressed for obtaining  and maintaining a balanced life. Patients will be encouraged to explore ways to assertively make their unbalanced needs known to significant others in their lives, using other group members and facilitator for support and feedback.  Therapeutic Goals: 1. Patient will identify two or more emotions or situations they have that consume much of in their lives. 2. Patient will identify signs/triggers that life has become out of balance:  3. Patient will identify two ways to set boundaries in order to achieve balance in their lives:  4. Patient will demonstrate ability to communicate their needs through discussion and/or role plays  Summary of Patient Progress: Pt shared that financial and work are the areas that are currently out of balance in her life.  Pt responded to CSW questions during group discussion about ways to recognized and respond to areas of life that get out of balance and appeared to be attentive throughout.          Therapeutic Modalities:   Cognitive Behavioral Therapy Solution-Focused Therapy Assertiveness Training  Lurline Idol, Marceline

## 2017-11-22 NOTE — Plan of Care (Signed)
D: Pt denies SI/HI/AV hallucinations. Pt is pleasant and cooperative. Pt goal for today is to feel better and get some rest. A: Pt was offered support and encouragement. Pt was given scheduled medications. Pt was encourage to attend groups. Q 15 minute checks were done for safety.  R:Pt attends groups and interacts well with peers and staff. Pt is taking medication. Pt has no complaints.Pt receptive to treatment and safety maintained on unit.    Problem: Medication: Goal: Compliance with prescribed medication regimen will improve Outcome: Progressing Note:  Patient is taking medication as prescribed

## 2017-11-22 NOTE — Plan of Care (Signed)
  Problem: Safety: Goal: Periods of time without injury will increase Outcome: Progressing   Problem: Safety: Goal: Periods of time without injury will increase Outcome: Progressing   Problem: Self-Concept: Goal: Level of anxiety will decrease Outcome: Progressing   Problem: Medication: Goal: Compliance with prescribed medication regimen will improve Outcome: Progressing   Problem: Medication: Goal: Compliance with prescribed medication regimen will improve Outcome: Progressing DAR NOTE: Patient presents with anxious affect and depressed mood.  Denies suicidal thoughts, auditory and visual hallucinations.  Reports poor night sleep due to loose stools and diarrhea throughout the night.  Rates depression at 8, hopelessness at 5, and anxiety at 8.  Maintained on routine safety checks.  Medications given as prescribed.  Support and encouragement offered as needed.  Attended group and participated.  Patient visible in milieu with minimal interaction.  Requested and received Imodium 2 mg for complain of diarrhea with fair effect.

## 2017-11-22 NOTE — Progress Notes (Addendum)
[facility] MD Progress Note  11/22/2017 1:39 PM [name]  MRN:  [id number] Subjective: Patient reports persistent depression and anxiety although acknowledges some improvement compared to admission.  Worries about disposition issues, in particular whether her brother will still be living at home when she returns.  States that her sister has been working on this issue and is working on getting brother to leave soon. She reports significant diarrhea since yesterday, with several bowel movements yesterday and it this a.m.Marland Kitchen  Describes as "very liquidy", no blood or melenas.  Denies vomiting.  Objective : I have discussed case with treatment team and have met with patient. Patient is a 62 year old female, single, lives with brother. Reports history of Bipolar Disorder in the past, with prior admission at another psychiatric unit in February for mood episode .She presented to ED due to worsening depression, suicidal ideations in the context of medication non compliance x several weeks.  Patient continues to present with depression and a somewhat constricted/anxious affect, but appears to be improving gradually.  As above, describes home stressor as a major issue and one that is contributing to her depression and anxiety, mainly living with her alcoholic brother.  States that brother has agreed to leave and that her sister is helping to ensure this process occurs soon, she ruminates about returning home while he is still living there. As above reports diarrhea which started yesterday and is not currently associated with nausea, vomiting, abdominal pain,or fever. The only new medication she is on is Effexor XR, but she received first dose this morning and developed diarrhea yesterday preceding Effexor XR dosing, so it is not currently felt that the diarrhea is likely to be medication side effect related. (Other medications-Neurontin, Depakote, Seroquel-are not new and states she has tolerated them well thus far  without any GI side effects) Denies suicidal ideations at this time. Behavior on unit in good control, visible on unit Labs reviewed as below.  Principal Problem: Reports history of Bipolar Disorder/Depressed  Diagnosis:   Patient Active Problem List   Diagnosis Date Noted  . MDD (major depressive disorder), recurrent severe, without psychosis ([place]) [F33.2] 11/14/2017  . Severe bipolar I disorder, most recent episode depressed ([place]) [F31.4]   . 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 Lung cancer ([place]) [C34.90] 04/24/2011   Total Time spent with patient: 20 minutes  Past Psychiatric History: See admission H&P  Past Medical History:  Past Medical History:  Diagnosis Date  . Allergy   . Depression   . Drug abuse ([place])    history of, went to rehab 2015  . GERD (gastroesophageal reflux disease)   . Lung cancer ([place])    lung ca dx 11/11- right upper lobe    Past Surgical History:  Procedure Laterality Date  . ANKLE FRACTURE SURGERY Left   . LUNG LOBECTOMY Right 2011   RUL removed for lung cancer  . STYLOID PROCESS EXCISION Left 10/16/2014   Procedure: EXCISION LEFT STYLOID PROCESS;  Surgeon: Rozetta Nunnery, MD;  Location: [facility] [place];  Service: ENT;  Laterality: Left;  . TONSILLECTOMY Bilateral 10/16/2014   Procedure: BILATERAL TONSILLECTOMY;  Surgeon: Rozetta Nunnery, MD;  Location: [place];  Service: ENT;  Laterality: Bilateral;  . TUBAL LIGATION     Family History:  Family History  Problem Relation Age of Onset  . Cancer Mother   . Cancer Father    Family Psychiatric  History:  See admission H&P Social History:  Social History   Substance and Sexual Activity  Alcohol Use No     Social History   Substance and Sexual Activity  Drug Use No    Social History   Socioeconomic History  . Marital status: Single    Spouse name: Not on file  . Number of children:  Not on file  . Years of education: Not on file  . Highest education level: Not on file  Occupational History  . Not on file  Social Needs  . Financial resource strain: Not on file  . Food insecurity:    Worry: Not on file    Inability: Not on file  . Transportation needs:    Medical: Not on file    Non-medical: Not on file  Tobacco Use  . Smoking status: Former Smoker    Last attempt to quit: 05/15/2013    Years since quitting: 4.5  . Smokeless tobacco: Never Used  . Tobacco comment: E-sig " pnce in a while"  Substance and Sexual Activity  . Alcohol use: No  . Drug use: No  . Sexual activity: Not on file  Lifestyle  . Physical activity:    Days per week: Not on file    Minutes per session: Not on file  . Stress: Not on file  Relationships  . Social connections:    Talks on phone: Not on file    Gets together: Not on file    Attends religious service: Not on file    Active member of club or organization: Not on file    Attends meetings of clubs or organizations: Not on file    Relationship status: Not on file  Other Topics Concern  . Not on file  Social History Narrative  . Not on file   Additional Social History:   Sleep: Fair  Appetite:  Fair  Current Medications: Current Facility-Administered Medications  Medication Dose Route Frequency Provider Last Rate Last Dose  . acetaminophen (TYLENOL) tablet 650 mg  650 mg Oral Q6H PRN Nanci Pina, FNP   650 mg at 11/22/17 0956  . alum & mag hydroxide-simeth (MAALOX/MYLANTA) 200-200-20 MG/5ML suspension 30 mL  30 mL Oral Q4H PRN Lavina Hamman, Takia S, FNP      . divalproex (DEPAKOTE ER) 24 hr tablet 1,000 mg  1,000 mg Oral Daily Lindon Romp A, NP   1,000 mg at 11/22/17 0954  . gabapentin (NEURONTIN) capsule 200 mg  200 mg Oral TID Sharma Covert, MD   200 mg at 11/22/17 1159  . loperamide (IMODIUM) capsule 2 mg  2 mg Oral PRN Lindon Romp A, NP   2 mg at 11/22/17 1159  . magnesium hydroxide (MILK OF MAGNESIA)  suspension 30 mL  30 mL Oral Daily PRN Nanci Pina, FNP      . ondansetron [facility]) tablet 4 mg  4 mg Oral Q6H PRN Mordecai Maes, NP   4 mg at 11/21/17 1202  . potassium chloride SA (K-DUR,KLOR-CON) CR tablet 40 mEq  40 mEq Oral Daily Lindon Romp A, NP   40 mEq at 11/22/17 0954  . QUEtiapine (SEROQUEL) tablet 400 mg  400 mg Oral QHS Sharma Covert, MD   400 mg at 11/21/17 2119  . venlafaxine XR (EFFEXOR-XR) 24 hr capsule 37.5 mg  37.5 mg Oral Q breakfast Cobos, Myer Peer, MD   37.5 mg at 11/22/17 3220    Lab Results:  Results for orders placed or performed during the hospital  encounter of 11/14/17 (from the past 48 hour(s))  Hemoglobin A1c     Status: None   Collection Time: 11/22/17  6:25 AM  Result Value Ref Range   Hgb A1c MFr Bld 5.4 4.8 - 5.6 %    Comment: (NOTE) Pre diabetes:          5.7%-6.4% Diabetes:              >6.4% Glycemic control for   <7.0% adults with diabetes    Mean Plasma Glucose 108.28 mg/dL    Comment: Performed at [facility] [address], [place], [place] [address]  Lipid panel     Status: Abnormal   Collection Time: 11/22/17  6:25 AM  Result Value Ref Range   Cholesterol 287 (H) 0 - 200 mg/dL   Triglycerides 198 (H) <150 mg/dL   HDL 49 >40 mg/dL   Total CHOL/HDL Ratio 5.9 RATIO   VLDL 40 0 - 40 mg/dL   LDL Cholesterol 198 (H) 0 - 99 mg/dL    Comment:        Total Cholesterol/HDL:CHD Risk Coronary Heart Disease Risk Table                     Men   Women  1/2 Average Risk   3.4   3.3  Average Risk       5.0   4.4  2 X Average Risk   9.6   7.1  3 X Average Risk  23.4   11.0        Use the calculated Patient Ratio above and the CHD Risk Table to determine the patient's CHD Risk.        ATP III CLASSIFICATION (LDL):  <100     mg/dL   Optimal  100-129  mg/dL   Near or Above                    Optimal  130-159  mg/dL   Borderline  160-189  mg/dL   High  >190     mg/dL   Very High Performed at [facility] [address], [place], [place] [address]     Blood Alcohol level:  Lab Results  Component Value Date   ETH <10 11/13/2017   ETH 179 (H) 11/08/9483    Metabolic Disorder Labs: Lab Results  Component Value Date   HGBA1C 5.4 11/22/2017   MPG 108.28 11/22/2017   MPG 99.67 11/14/2017   No results found for: PROLACTIN Lab Results  Component Value Date   CHOL 287 (H) 11/22/2017   TRIG 198 (H) 11/22/2017   HDL 49 11/22/2017   CHOLHDL 5.9 11/22/2017   VLDL 40 11/22/2017   LDLCALC 198 (H) 11/22/2017   LDLCALC 106 (H) 11/14/2017    Physical Findings: AIMS: Facial and Oral Movements Muscles of Facial Expression: None, normal Lips and Perioral Area: None, normal Jaw: None, normal Tongue: None, normal,Extremity Movements Upper (arms, wrists, hands, fingers): None, normal Lower (legs, knees, ankles, toes): None, normal, Trunk Movements Neck, shoulders, hips: None, normal, Overall Severity Severity of abnormal movements (highest score from questions above): None, normal Incapacitation due to abnormal movements: None, normal Patient's awareness of abnormal movements (rate only patient's report): No Awareness, Dental Status Current problems with teeth and/or dentures?: No Does patient usually wear dentures?: No  CIWA:  CIWA-Ar Total: 1 COWS:  COWS Total Score: 1  Musculoskeletal: Strength & Muscle Tone: within normal limits Gait & Station:  normal Patient leans: N/A  Psychiatric Specialty Exam: Physical Exam  Nursing note and vitals reviewed. Constitutional: She is oriented to person, place, and time. She appears well-developed and well-nourished.  HENT:  Head: Normocephalic and atraumatic.  Respiratory: Effort normal.  Neurological: She is alert and oriented to person, place, and time.    ROS denies current headache, no chest pain, no shortness of breath, no vomiting , no rash -endorses diarrhea, no fever or chills  Blood pressure 118/85, pulse 79, temperature 97.8 F  (36.6 C), temperature source Oral, resp. rate 16, height _0  (1.727 m), weight 71.2 kg (157 lb), SpO2 (!) 84 %.Body mass index is 23.87 kg/m.  General Appearance: Fairly Groomed  Eye Contact:  Good  Speech:  Normal Rate  Volume:  Normal  Mood:  Gradually improving mood but remains depressed  Affect:  Constricted but smiles briefly and appropriately at times  Thought Process:  Linear and Descriptions of Associations: Intact  Orientation:  Full (Time, Place, and Person)  Thought Content:  No hallucinations, no delusions, not internally preoccupied  Suicidal Thoughts:  No denies suicidal or self injurious ideations, denies homicidal or violent ideations.  Specifically also denies any homicidal or violent ideations towards her brother  Homicidal Thoughts:  No  Memory: recent and remote grossly intact  Judgement:  Other:  improving   Insight:  improving   Psychomotor Activity:  Normal- no psychomotor agitation  Concentration:  Concentration: Good and Attention Span: Good  Recall:  Good  Fund of Knowledge:  Good  Language:  Good  Akathisia:  Negative  Handed:  Right  AIMS (if indicated):     Assets:  Communication Skills Desire for Improvement Housing Resilience Social Support  ADL's:  Intact  Cognition:  WNL  Sleep:  Number of Hours: 6.75   Assessment -patient remains depressed, anxious, ruminative about her psychosocial stressors and whether her brother will still be at home when she returns.  Her affect does seem to be becoming more reactive, denies suicidal ideations.  She has developed diarrhea since yesterday, with several bowel movements yesterday afternoon and into this morning.  Does not endorse any associated symptoms and does not appear to be in any acute distress or discomfort, denies vomiting, does not endorse abdominal pain.  Diarrhea is currently not felt to be side effect related as the only new medication she is on his Effexor XR and her first dose this morning was  preceded by diarrhea yesterday.   Treatment Plan Summary: Treatment Plan reviewed as below today 7/10 Continue Seroquel 400 mgrs QHS for mood disorder Continue Depakote ER 1000 mgrs QDAY for mood disorder  Continue Neurontin 200 mgrs TID for anxiety  Continue Effexor XR 37.5 mgrs QDAY for depression and anxiety Continue Imodium PRN for diarrhea Treatment Team working on disposition planning options Check BMP and CBC  Encourage PO fluids , check orthostatic vital signs Q shift  Jenne Campus, MD 11/22/2017, 1:39 PM  Patient ID: [name], female   DOB: 02/28/1956, 62 y.o.   MRN: [id number]

## 2017-11-23 MED ORDER — QUETIAPINE FUMARATE 200 MG PO TABS
200.0000 mg | ORAL_TABLET | Freq: Every day | ORAL | Status: DC
  Administered 2017-11-23 – 2017-11-24 (×2): 200 mg via ORAL
  Filled 2017-11-23 (×4): qty 1

## 2017-11-23 MED ORDER — BENZOCAINE 10 % MT GEL
Freq: Three times a day (TID) | OROMUCOSAL | Status: DC | PRN
  Administered 2017-11-23: 21:00:00 via OROMUCOSAL

## 2017-11-23 MED ORDER — LORAZEPAM 0.5 MG PO TABS: 0.5000 mg | ORAL_TABLET | Freq: Four times a day (QID) | ORAL | Status: DC | PRN

## 2017-11-23 MED ORDER — GABAPENTIN 100 MG PO CAPS
100.0000 mg | ORAL_CAPSULE | Freq: Three times a day (TID) | ORAL | Status: DC
  Administered 2017-11-23 – 2017-11-28 (×15): 100 mg via ORAL
  Filled 2017-11-23: qty 1
  Filled 2017-11-23: qty 21
  Filled 2017-11-23 (×9): qty 1
  Filled 2017-11-23 (×2): qty 21
  Filled 2017-11-23 (×2): qty 1
  Filled 2017-11-23: qty 21
  Filled 2017-11-23: qty 1
  Filled 2017-11-23 (×2): qty 21
  Filled 2017-11-23 (×4): qty 1

## 2017-11-23 MED ORDER — BENZOCAINE 10 % MT GEL
OROMUCOSAL | Status: AC
  Filled 2017-11-23: qty 9.4

## 2017-11-23 NOTE — Progress Notes (Signed)
Recreation Therapy Notes  Date: 7.12.19 Time: 0930 Location: 300 Hall Dayroom  Group Topic: Stress Management  Goal Area(s) Addresses:  Patient will verbalize importance of using healthy stress management.  Patient will identify positive emotions associated with healthy stress management.   Behavioral Response: Engaged  Intervention: Stress Management  Activity :  Meditation.  LRT introduced the stress management technique of meditation.  LRT played a meditation on being resilient.  Patients were to listen to the meditation and follow along as meditation played.  Education:  Stress Management, Discharge Planning.   Education Outcome: Acknowledges edcuation/In group clarification offered/Needs additional education  Clinical Observations/Feedback: Pt attended and participated in group.    Victorino Sparrow, LRT/CTRS         Ria Comment, Marcene Laskowski A 11/23/2017 11:35 AM

## 2017-11-23 NOTE — Plan of Care (Signed)
  Problem: Activity: Goal: Interest or engagement in activities will improve Outcome: Progressing   Problem: Coping: Goal: Ability to verbalize frustrations and anger appropriately will improve Outcome: Progressing   Problem: Medication: Goal: Compliance with prescribed medication regimen will improve Outcome: Progressing D: Pt A & O X4. Denies SI, HI, AVH and pain. Presents animated, hyperactive / restless, loud and very talkative this shift "I just feel manic but he will not give me anything, my blood pressure low". Visible in milieu majority of this shift. Reports fair appetite, fair mood, low energy and poor concentration level on self inventory sheet. Observed in scheduled unit groups and was engaged.  A: Introduced self to pt. Emotional support and availability provided to pt.  Scheduled medications administered with verbal education and effects monitored. Continues to encouraged fluids due to low BP. Orthostatic vitals done and WNL. Q 15 minutes safety checks maintained without outburst or self harm gestures thus far.   R: Pt has been cooperative with care. Compliant with medications when offered. Denies adverse drug reactions when assessed. Tolerates all PO intake well. Remains safe on and off unit.

## 2017-11-23 NOTE — [facility] Group Notes (Signed)
[place] LCSW Group Therapy Note  Date/Time: 11/23/17, 1315  Type of Therapy/Topic:  Group Therapy:  Feelings about Diagnosis  Participation Level:  Minimal   Mood:   Description of Group:    This group will allow patients to explore their thoughts and feelings about diagnoses they have received. Patients will be guided to explore their level of understanding and acceptance of these diagnoses. Facilitator will encourage patients to process their thoughts and feelings about the reactions of others to their diagnosis, and will guide patients in identifying ways to discuss their diagnosis with significant others in their lives. This group will be process-oriented, with patients participating in exploration of their own experiences as well as giving and receiving support and challenge from other group members.   Therapeutic Goals: 1. Patient will demonstrate understanding of diagnosis as evidence by identifying two or more symptoms of the disorder:  2. Patient will be able to express two feelings regarding the diagnosis 3. Patient will demonstrate ability to communicate their needs through discussion and/or role plays  Summary of Patient Progress:Pt came into group briefly and then left again after only a few minutes.        Therapeutic Modalities:   Cognitive Behavioral Therapy Brief Therapy Feelings Identification   Lurline Idol, LCSW

## 2017-11-23 NOTE — Progress Notes (Signed)
Patient shared in group this evening that she had a "manic day". She explained that she felt very restless and hyper. Her goal for tomorrow is to get more rest and to be less manic.

## 2017-11-23 NOTE — Progress Notes (Addendum)
Nutrition Education Note  Pt attended group focusing on general, healthful nutrition education.  RD emphasized the importance of eating regular meals and snacks throughout the day. Consuming sugar-free beverages and incorporating fruits and vegetables into diet when possible. Provided examples of healthy snacks. Patient encouraged to leave group with a goal to improve nutrition/healthy eating.   Diet Order:  Diet Order           Diet regular Room service appropriate? Yes; Fluid consistency: Thin  Diet effective now         Pt is also offered choice of unit snacks mid-morning and mid-afternoon.  Pt is eating as desired.   If additional nutrition issues arise, please consult RD.    Jarome Matin, MS, RD, LDN, [facility] Clinical Dietitian Pager # [phone] After hours/weekend pager # [phone]

## 2017-11-23 NOTE — Tx Team (Signed)
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 acetaminophen (TYLENOL) tablet 650 mg  650 mg Oral Q6H PRN Claudia Pina, FNP   650 mg at 11/22/17 0956  . alum & mag hydroxide-simeth (MAALOX/MYLANTA) 200-200-20 MG/5ML suspension 30 mL  30 mL Oral Q4H PRN [name], Takia S, FNP      . divalproex (DEPAKOTE ER) 24 hr tablet 1,000 mg  1,000 mg Oral Daily Claudia Romp Gonzalez, [name]   1,000 mg at 11/23/17 0810  . gabapentin (NEURONTIN) capsule 200 mg  200 mg Oral TID Claudia Covert, [name]   200 mg at 11/23/17 0810  . loperamide (IMODIUM) capsule 2 mg  2 mg Oral PRN Claudia Romp Gonzalez, [name]   2 mg at 11/22/17 1159  . magnesium hydroxide (MILK OF MAGNESIA) suspension 30 mL  30 mL Oral Daily PRN Claudia Pina, FNP      . ondansetron [facility]) tablet 4 mg  4 mg Oral Q6H PRN Claudia Maes, [name]   4 mg at 11/21/17 1202  . QUEtiapine (SEROQUEL) tablet 400 mg  400 mg Oral QHS Claudia Covert, [name]   400 mg at 11/22/17 2142  . venlafaxine XR (EFFEXOR-XR) 24 hr capsule 37.5 mg  37.5 mg Oral Q breakfast [name], Claudia Peer, [name]   37.5 mg at 11/23/17 0810   PTA Medications: Medications Prior to Admission  Medication Sig Dispense Refill Last Dose  . trazodone (DESYREL) 300 MG tablet Take 300 mg by mouth at bedtime.     . divalproex (DEPAKOTE ER) 500 MG 24 hr tablet Take 1,000 mg by mouth daily.  1 11/13/2017 at Unknown time  . potassium chloride SA (K-DUR,KLOR-CON) 20 MEQ tablet Take 2 tablets (40 mEq total) by mouth daily. 6 tablet 0   . venlafaxine XR (EFFEXOR-XR) 37.5 MG 24  hr capsule Take 37.5 mg by mouth daily.  1 11/13/2017 at Unknown time    Patient Stressors: Financial difficulties Medication change or noncompliance  Patient Strengths: Capable of independent living Curator fund of knowledge Motivation for treatment/growth Work skills  Treatment Modalities: Medication Management, Group therapy, Case management,  1 to 1 session with clinician, Psychoeducation, Recreational therapy.   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 Long Term Goal(s): Improvement in symptoms so as ready for discharge Improvement in symptoms so as ready for discharge   Short Term Goals: Ability to identify changes in lifestyle to reduce recurrence of condition will improve Ability to verbalize feelings will improve Ability to disclose and discuss suicidal ideas Ability to demonstrate self-control will improve Ability to identify and develop effective coping behaviors will improve Ability to maintain clinical measurements within normal limits will improve Compliance with prescribed medications will improve Ability to identify changes in lifestyle to reduce recurrence of condition will improve Ability to verbalize feelings will improve Ability to disclose and discuss suicidal ideas Ability to demonstrate self-control will improve Ability to identify and develop effective coping behaviors will improve Ability to maintain clinical measurements within normal limits will improve Compliance with prescribed medications will improve  Medication Management: Evaluate patient's response, side effects, and tolerance of  medication regimen.  Therapeutic Interventions: 1 to 1 sessions, Unit Group sessions and Medication administration.  Evaluation of Outcomes: Progressing   Physician Treatment Plan for Secondary Diagnosis: Active Problems:   MDD (major depressive disorder), recurrent severe, without psychosis ([place])    Severe bipolar I disorder, most recent episode depressed ([place])  Long Term Goal(s): Improvement in symptoms so as ready for discharge Improvement in symptoms so as ready for discharge   Short Term Goals: Ability to identify changes in lifestyle to reduce recurrence of condition will improve Ability to verbalize feelings will improve Ability to disclose and discuss suicidal ideas Ability to demonstrate self-control will improve Ability to identify and develop effective coping behaviors will improve Ability to maintain clinical measurements within normal limits will improve Compliance with prescribed medications will improve Ability to identify changes in lifestyle to reduce recurrence of condition will improve Ability to verbalize feelings will improve Ability to disclose and discuss suicidal ideas Ability to demonstrate self-control will improve Ability to identify and develop effective coping behaviors will improve Ability to maintain clinical measurements within normal limits will improve Compliance with prescribed medications will improve     Medication Management: Evaluate patient's response, side effects, and tolerance of medication regimen.  Therapeutic Interventions: 1 to 1 sessions, Unit Group sessions and Medication administration.  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 [name] will report any adverse and/or side effects to prescribing  provider.  Therapeutic Interventions: 1 on 1 counseling sessions, Psychoeducation, Medication administration, Evaluate responses to treatment, Monitor vital signs and CBGs as ordered, Perform/monitor CIWA, COWS, AIMS and Fall Risk screenings as ordered, Perform wound care treatments as ordered.  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  Short Term Goals: Engage patient in aftercare planning with referrals and resources  Therapeutic Interventions: Assess for all discharge needs, 1 to 1 time with Social worker, Explore available resources and support systems, Assess for adequacy in community support network, Educate family and significant other(s) on suicide prevention, Complete Psychosocial Assessment, Interpersonal group therapy.  Evaluation of Outcomes: Progressing  Progress in Treatment: Attending groups: Yes. Participating in groups: Yes. Taking medication as prescribed: Yes. Toleration medication: Yes. Family/Significant other contact made: SPE completed with pt's sister.  Patient understands diagnosis: Yes. Discussing patient identified problems/goals with staff: Yes. Medical problems stabilized or resolved: Yes. Denies suicidal/homicidal ideation: Yes. Issues/concerns per patient self-inventory: No. Other:   New problem(s) identified:None  New Short Term/Long Term Goal(s):medication stabilization, elimination of SI thoughts, development of comprehensive mental wellness plan.    Patient Goals:  "I need help with my medications, depression, appetite and not being able to sleep"  Discharge Plan or Barriers: Pt has appt at [facility] for Thursday, 7/11. [place] pamphlet, Mobile Crisis information, and AA/NA information provided to patient for  additional community support and resources.   Reason for Continuation of Hospitalization: Anxiety Depression Medication stabilization  Estimated Length of  Stay: 1-3 days.  Attendees: Patient: 11/23/2017   Physician: Claudia Parke Poisson, [name] 11/23/2017   Nursing: Claudia Neer, [name] 11/23/2017   [name] Care Manager: 11/23/2017   Social Worker: Claudia Idol, [name] 11/23/2017   Recreational Therapist:  11/23/2017   Other:  11/23/2017   Other:  11/23/2017   Other: 11/23/2017        Scribe for Treatment Team: [name], [place] 11/23/2017 12:07 PM

## 2017-11-23 NOTE — Progress Notes (Signed)
[facility] MD Progress Note  11/23/2017 1:47 PM [name]  MRN:  [id number] Subjective: Patient reports her diarrhea has subsided and states she has not had any further loose stool since early this a.m.  She has been drinking p.o. fluids freely.  She states she feels subjectively anxious and agitated, " like I am manic".  Denies suicidal ideations.  Objective : I have discussed case with treatment team and have met with patient. At this time presents alert, attentive, fully oriented x3, describes partial improvement compared to admission but reports that today she is feeling more anxious and subjectively agitated/restless.  No psychomotor agitation is noted and she was able to sit comfortably throughout our session.  Patient has been experiencing diarrhea x2 days but reports that it is now resolving.  It is not felt that this was a medication side effect as it did not correspond to any new medication trial and has been tolerating medications well.  Effexor XR was started yesterday but diarrhea preceded the onset of the antidepressant trial. Blood pressure readings have tended to be low , she has been drinking fluids throughout the day, and blood pressure at this time is 100/60 pulse 96.  Denies significant  dizziness or lightheadedness Have reviewed history-patient endorses history of hypomanic episodes in the past, depressive episodes and prior diagnoses of bipolar disorder.  Reports history of prior Depakote and Seroquel treatment, does not remember having had side effects.  Remains future oriented and her current plan is to return home hopefully once her brother has left the house as she states that he is the main stressor that has contributed to recent decompensation. As above patient reports feeling subjectively agitated but there is no psychomotor agitation at this time, calms down with support/empathy. Labs reviewed as below, BMP unremarkable , K+ 4.5 , BUN 10, Cr 0.89, CBC  unremarkable.  Principal Problem: Reports history of Bipolar Disorder/Depressed  Diagnosis:   Patient Active Problem List   Diagnosis Date Noted  . MDD (major depressive disorder), recurrent severe, without psychosis ([place]) [F33.2] 11/14/2017  . Severe bipolar I disorder, most recent episode depressed ([place]) [F31.4]   . 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 Lung cancer ([place]) [C34.90] 04/24/2011   Total Time spent with patient: 20 minutes  Past Psychiatric History: See admission H&P  Past Medical History:  Past Medical History:  Diagnosis Date  . Allergy   . Depression   . Drug abuse ([place])    history of, went to rehab 2015  . GERD (gastroesophageal reflux disease)   . Lung cancer ([place])    lung ca dx 11/11- right upper lobe    Past Surgical History:  Procedure Laterality Date  . ANKLE FRACTURE SURGERY Left   . LUNG LOBECTOMY Right 2011   RUL removed for lung cancer  . STYLOID PROCESS EXCISION Left 10/16/2014   Procedure: EXCISION LEFT STYLOID PROCESS;  Surgeon: Rozetta Nunnery, MD;  Location: [place];  Service: ENT;  Laterality: Left;  . TONSILLECTOMY Bilateral 10/16/2014   Procedure: BILATERAL TONSILLECTOMY;  Surgeon: Rozetta Nunnery, MD;  Location: [place];  Service: ENT;  Laterality: Bilateral;  . TUBAL LIGATION     Family History:  Family History  Problem Relation Age of Onset  . Cancer Mother   . Cancer Father    Family Psychiatric  History: See admission H&P Social History:  Social History   Substance and Sexual Activity  Alcohol Use No     Social History   Substance and Sexual Activity  Drug Use No    Social History   Socioeconomic History  . Marital status: Single    Spouse name: Not on file  . Number of children: Not on file  . Years of education: Not on file  . Highest education level: Not on file  Occupational History  . Not on file   Social Needs  . Financial resource strain: Not on file  . Food insecurity:    Worry: Not on file    Inability: Not on file  . Transportation needs:    Medical: Not on file    Non-medical: Not on file  Tobacco Use  . Smoking status: Former Smoker    Last attempt to quit: 05/15/2013    Years since quitting: 4.5  . Smokeless tobacco: Never Used  . Tobacco comment: E-sig " pnce in a while"  Substance and Sexual Activity  . Alcohol use: No  . Drug use: No  . Sexual activity: Not on file  Lifestyle  . Physical activity:    Days per week: Not on file    Minutes per session: Not on file  . Stress: Not on file  Relationships  . Social connections:    Talks on phone: Not on file    Gets together: Not on file    Attends religious service: Not on file    Active member of club or organization: Not on file    Attends meetings of clubs or organizations: Not on file    Relationship status: Not on file  Other Topics Concern  . Not on file  Social History Narrative  . Not on file   Additional Social History:   Sleep: Fair  Appetite:  Fair  Current Medications: Current Facility-Administered Medications  Medication Dose Route Frequency Provider Last Rate Last Dose  . acetaminophen (TYLENOL) tablet 650 mg  650 mg Oral Q6H PRN Nanci Pina, FNP   650 mg at 11/22/17 0956  . alum & mag hydroxide-simeth (MAALOX/MYLANTA) 200-200-20 MG/5ML suspension 30 mL  30 mL Oral Q4H PRN Lavina Hamman, Takia S, FNP      . divalproex (DEPAKOTE ER) 24 hr tablet 1,000 mg  1,000 mg Oral Daily Lindon Romp A, NP   1,000 mg at 11/23/17 0810  . gabapentin (NEURONTIN) capsule 200 mg  200 mg Oral TID Sharma Covert, MD   200 mg at 11/23/17 1300  . loperamide (IMODIUM) capsule 2 mg  2 mg Oral PRN Lindon Romp A, NP   2 mg at 11/22/17 1159  . magnesium hydroxide (MILK OF MAGNESIA) suspension 30 mL  30 mL Oral Daily PRN Nanci Pina, FNP      . ondansetron [facility]) tablet 4 mg  4 mg Oral Q6H PRN Mordecai Maes, NP   4 mg at 11/21/17 1202  . QUEtiapine (SEROQUEL) tablet 400 mg  400 mg Oral QHS Sharma Covert, MD   400 mg at 11/22/17 2142    Lab Results:  Results for orders placed or performed during the hospital encounter of 11/14/17 (from the past 48 hour(s))  Hemoglobin A1c     Status: None   Collection Time: 11/22/17  6:25 AM  Result Value Ref Range   Hgb A1c MFr Bld 5.4 4.8 - 5.6 %    Comment: (NOTE) Pre diabetes:          5.7%-6.4% Diabetes:              >  6.4% Glycemic control for   <7.0% adults with diabetes    Mean Plasma Glucose 108.28 mg/dL    Comment: Performed at [facility] [address] [place], [place] [address]  Lipid panel     Status: Abnormal   Collection Time: 11/22/17  6:25 AM  Result Value Ref Range   Cholesterol 287 (H) 0 - 200 mg/dL   Triglycerides 198 (H) <150 mg/dL   HDL 49 >40 mg/dL   Total CHOL/HDL Ratio 5.9 RATIO   VLDL 40 0 - 40 mg/dL   LDL Cholesterol 198 (H) 0 - 99 mg/dL    Comment:        Total Cholesterol/HDL:CHD Risk Coronary Heart Disease Risk Table                     Men   Women  1/2 Average Risk   3.4   3.3  Average Risk       5.0   4.4  2 X Average Risk   9.6   7.1  3 X Average Risk  23.4   11.0        Use the calculated Patient Ratio above and the CHD Risk Table to determine the patient's CHD Risk.        ATP III CLASSIFICATION (LDL):  <100     mg/dL   Optimal  100-129  mg/dL   Near or Above                    Optimal  130-159  mg/dL   Borderline  160-189  mg/dL   High  >190     mg/dL   Very High Performed at [facility] [address], [place], [place] [address]   Basic metabolic panel     Status: Abnormal   Collection Time: 11/22/17  6:29 PM  Result Value Ref Range   Sodium 139 135 - 145 mmol/L   Potassium 4.5 3.5 - 5.1 mmol/L   Chloride 101 98 - 111 mmol/L    Comment: Please note change in reference range.   CO2 30 22 - 32 mmol/L   Glucose, Bld 146 (H) 70 - 99 mg/dL    Comment:  Please note change in reference range.   BUN 10 6 - 20 mg/dL    Comment: Please note change in reference range.   Creatinine, Ser 0.89 0.44 - 1.00 mg/dL   Calcium 8.9 8.9 - 10.3 mg/dL   GFR calc non Af Amer >60 >60 mL/min   GFR calc Af Amer >60 >60 mL/min    Comment: (NOTE) The eGFR has been calculated using the CKD EPI equation. This calculation has not been validated in all clinical situations. eGFR's persistently <60 mL/min signify possible Chronic Kidney Disease.    Anion gap 8 5 - 15    Comment: Performed at [facility], [place] [address] [place], [place] [address]  CBC with Differential/Platelet     Status: None   Collection Time: 11/22/17  6:29 PM  Result Value Ref Range   WBC 7.5 4.0 - 10.5 K/uL   RBC 4.59 3.87 - 5.11 MIL/uL   Hemoglobin 14.5 12.0 - 15.0 g/dL   HCT 43.5 36.0 - 46.0 %   MCV 94.8 78.0 - 100.0 fL   MCH 31.6 26.0 - 34.0 pg   MCHC 33.3 30.0 - 36.0 g/dL   RDW 13.0 11.5 - 15.5 %   Platelets 167 150 - 400 K/uL  Neutrophils Relative % 76 %   Neutro Abs 5.7 1.7 - 7.7 K/uL   Lymphocytes Relative 14 %   Lymphs Abs 1.1 0.7 - 4.0 K/uL   Monocytes Relative 9 %   Monocytes Absolute 0.7 0.1 - 1.0 K/uL   Eosinophils Relative 1 %   Eosinophils Absolute 0.1 0.0 - 0.7 K/uL   Basophils Relative 0 %   Basophils Absolute 0.0 0.0 - 0.1 K/uL    Comment: Performed at [facility], [place] [address], [place], [place] [address]    Blood Alcohol level:  Lab Results  Component Value Date   ETH <10 11/13/2017   ETH 179 (H) 34/91/7915    Metabolic Disorder Labs: Lab Results  Component Value Date   HGBA1C 5.4 11/22/2017   MPG 108.28 11/22/2017   MPG 99.67 11/14/2017   No results found for: PROLACTIN Lab Results  Component Value Date   CHOL 287 (H) 11/22/2017   TRIG 198 (H) 11/22/2017   HDL 49 11/22/2017   CHOLHDL 5.9 11/22/2017   VLDL 40 11/22/2017   LDLCALC 198 (H) 11/22/2017   LDLCALC 106 (H) 11/14/2017    Physical  Findings: AIMS: Facial and Oral Movements Muscles of Facial Expression: None, normal Lips and Perioral Area: None, normal Jaw: None, normal Tongue: None, normal,Extremity Movements Upper (arms, wrists, hands, fingers): None, normal Lower (legs, knees, ankles, toes): None, normal, Trunk Movements Neck, shoulders, hips: None, normal, Overall Severity Severity of abnormal movements (highest score from questions above): None, normal Incapacitation due to abnormal movements: None, normal Patient's awareness of abnormal movements (rate only patient's report): No Awareness, Dental Status Current problems with teeth and/or dentures?: No Does patient usually wear dentures?: No  CIWA:  CIWA-Ar Total: 1 COWS:  COWS Total Score: 1  Musculoskeletal: Strength & Muscle Tone: within normal limits Gait & Station: normal Patient leans: N/A  Psychiatric Specialty Exam: Physical Exam  Nursing note and vitals reviewed. Constitutional: She is oriented to person, place, and time. She appears well-developed and well-nourished.  HENT:  Head: Normocephalic and atraumatic.  Respiratory: Effort normal.  Neurological: She is alert and oriented to person, place, and time.    ROS denies current headache, no chest pain, no shortness of breath, no vomiting , reports diarrhea is improving/resolving , no fever, no chills  Blood pressure (!) 81/37, pulse (!) 101, temperature 97.8 F (36.6 C), temperature source Oral, resp. rate 12, height _0  (1.727 m), weight 71.2 kg (157 lb), SpO2 (!) 84 %.Body mass index is 23.87 kg/m.  Repeat BP ( manual)  100/60 pulse 96   General Appearance: Fairly Groomed  Eye Contact:  Good  Speech:  Normal Rate  Volume:  Normal  Mood:  Reports she feels subjectively restless/agitated today  Affect:  More reactive, smiles at times appropriately, not currently expansive or overtly irritable  Thought Process:  Linear and Descriptions of Associations: Intact  Orientation:  Full (Time,  Place, and Person)-remains fully oriented x3, alert and attentive  Thought Content:  No hallucinations, no delusions, not internally preoccupied  Suicidal Thoughts:  No denies suicidal or self injurious ideations, denies homicidal or violent ideations.  Specifically also denies any homicidal or violent ideations towards her brother  Homicidal Thoughts:  No  Memory: recent and remote grossly intact  Judgement:  Other:  improving   Insight:  improving   Psychomotor Activity:  Normal- no psychomotor agitation  Concentration:  Concentration: Good and Attention Span: Good  Recall:  Good  Fund of Knowledge:  Good  Language:  Good  Akathisia:  Negative  Handed:  Right  AIMS (if indicated):     Assets:  Communication Skills Desire for Improvement Housing Resilience Social Support  ADL's:  Intact  Cognition:  WNL  Sleep:  Number of Hours: 5.5   Assessment -patient reports she feels more anxious and subjectively restless today.  No akathisia noted and there is no noticeable psychomotor agitation.  Speech is not pressured, affect does not appear overtly irritable or expansive.  She has been experiencing diarrhea x2 days, now improving/resolving with no further loose stools since early this a.m.  Drinking fluids freely.  BP has tended to be low, but improved to 100/60. Denies associated dizziness or lightheadedness at this time. No suicidal ideations, remains future oriented  Treatment Plan Summary: Treatment Plan reviewed as below today 7/12 Decrease  Seroquel to 200 mgrs QHS for mood disorder- rationale to taper dose is  to minimize potential side effects such as orthostatic hypotension Continue Depakote ER 1000 mgrs QDAY for mood disorder  Decrease  Neurontin to 100 mgrs TID for anxiety  Discontinue Effexor XR as reports increased anxiety, agitation  Treatment plan reviewed as below Continue to encourage PO fluids and continue to monitor vitals for orthostasis   Jenne Campus,  MD 11/23/2017, 1:47 PM    Patient ID: [name], female   DOB: 1956/03/24, 62 y.o.   MRN: [id number]

## 2017-11-24 NOTE — B[facility] Group Notes (Signed)
LCSW Group Therapy Note  11/24/2017    10:30-11:30am   Type of Therapy and Topic:  Group Therapy: Anger and Coping Skills  Participation Level:  Active   Description of Group:   In this group, patients learned how to recognize the physical, cognitive, emotional, and behavioral responses they have to anger-provoking situations.  They identified how they usually or often react when angered, and learned how healthy and unhealthy coping skills work initially, but the unhealthy ones stop working.   They analyzed how their frequently-chosen coping skill is possibly beneficial and how it is possibly unhelpful.  The group discussed a variety of healthier coping skills that could help in resolving the actual issues, as well as how to go about planning for the the possibility of future similar situations.  Therapeutic Goals: 1. Patients will identify one thing that makes them angry and how they feel emotionally and physically, what their thoughts are or tend to be in those situations, and what healthy or unhealthy coping mechanism they typically use 2. Patients will identify how their coping technique works for them, as well as how it works against them. 3. Patients will explore possible new behaviors to use in future anger situations. 4. Patients will learn that anger itself is normal and cannot be eliminated, and that healthier coping skills can assist with resolving conflict rather than worsening situations.  Summary of Patient Progress:  The patient shared that she often curse people out when angry.  She encouraged other patients throughout group and was very pleasant and helpful.  Therapeutic Modalities:   Cognitive Behavioral Therapy Motivation Interviewing  Maretta Los  .

## 2017-11-24 NOTE — Progress Notes (Signed)
Writer spoke with patient 1:1 and she reports feeling manic and was observed walking up and down the hallway. She c/o not sleeping well for the past 2 nights. Writer informed hero of medication scheduled and she reported that it was not going to help her rest. Writer explained to her the reason for the decrease in her Seroquel d/t low blood pressures. Writer encouraged her to try the Seroquel and see if it may help her to rest and if not speak with the doctor on tomorrow. Support given and safety maintained on unit with 15 min checks.

## 2017-11-24 NOTE — Progress Notes (Signed)
D: Pt was in the dayroom upon initial approach.  Pt presents with anxious affect and mood.  She describes her day as "okay" and reports her goal is to "stay positive, talk about positive stuff, no negative conversation."  Pt denies SI/HI, denies hallucinations, denies pain.  Pt has been visible in milieu interacting appropriately with others. Pt attended evening group.    A: Introduced self to pt.  Actively listened to pt and offered support and encouragement. Medication administered per order.  Q15 minute safety checks maintained.  R: Pt is safe on the unit.  Pt is compliant with medication.  Pt verbally contracts for safety.  Will continue to monitor and assess.

## 2017-11-24 NOTE — Progress Notes (Signed)
Patient ID: [name], female   DOB: [birth year], 62 y.o.   MRN: [id number] D: Patient was hesitant about getting out of bed this morning, required multiple positive verbal reinforcements to get up for her morning medications. Patient has a blunted affect and a depressed mood, and denies SI/HI/AVH.  Pt reports a poor appetite, was encouraged to eat breakfast, but stated that she was not hungry.  Pt encouraged to complete her self inventory sheet, as well, but has not completed one.    A: Pt educated on all of her medications this morning, took all, and went back to bed.  Pt is being maintained on Q15 minute checks for safety.  D: Pt denies any current concerns, will continue to monitor.

## 2017-11-24 NOTE — Progress Notes (Signed)
[facility] MD Progress Note  11/24/2017 10:40 AM ANGLINE Gonzalez  MRN:  [id number] Subjective: Patient reports she is feeling partially better today.  Yesterday had reported feeling subjectively agitated and" manic" . Today  acknowledges feeling calmer, no longer significantly agitated. Diarrhea has subsided, her last bowel movement was yesterday. Denies medication side effects at this time.  Objective : I have reviewed the chart notes and have met with patient. Patient is presenting with subjectively improving mood-as above yesterday had reported feeling "manic" and subjectively agitated, and today reports feeling calmer, less agitated , and is not presenting with current symptoms of mania-no pressured speech , no psychomotor restlessness, no irritability or expansive affect, no increased goal-directed behaviors. She remains anxious and vaguely depressed, ruminative about her home situation, fearful of discharge back home if her brother remains there, as she identifies him as a major stressor. Blood pressure readings have improved, no longer hypotensive, denies dizziness or lightheadedness. Visible on unit, going to some groups Currently does not endorse medication side effects. Denies suicidal ideations.  Principal Problem: Reports history of Bipolar Disorder/Depressed  Diagnosis:   Patient Active Problem List   Diagnosis Date Noted  . MDD (major depressive disorder), recurrent severe, without psychosis ([place]) [F33.2] 11/14/2017  . Severe bipolar I disorder, most recent episode depressed ([place]) [F31.4]   . 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 Lung cancer ([place]) [C34.90] 04/24/2011   Total Time spent with patient: 20 minutes  Past Psychiatric History: See admission H&P  Past Medical History:  Past Medical History:  Diagnosis Date  . Allergy   . Depression   . Drug abuse ([place])    history of, went to rehab 2015  . GERD  (gastroesophageal reflux disease)   . Lung cancer ([place])    lung ca dx 11/11- right upper lobe    Past Surgical History:  Procedure Laterality Date  . ANKLE FRACTURE SURGERY Left   . LUNG LOBECTOMY Right 2011   RUL removed for lung cancer  . STYLOID PROCESS EXCISION Left 10/16/2014   Procedure: EXCISION LEFT STYLOID PROCESS;  Surgeon: Rozetta Nunnery, MD;  Location: [place];  Service: ENT;  Laterality: Left;  . TONSILLECTOMY Bilateral 10/16/2014   Procedure: BILATERAL TONSILLECTOMY;  Surgeon: Rozetta Nunnery, MD;  Location: [facility];  Service: ENT;  Laterality: Bilateral;  . TUBAL LIGATION     Family History:  Family History  Problem Relation Age of Onset  . Cancer Mother   . Cancer Father    Family Psychiatric  History: See admission H&P Social History:  Social History   Substance and Sexual Activity  Alcohol Use No     Social History   Substance and Sexual Activity  Drug Use No    Social History   Socioeconomic History  . Marital status: Single    Spouse name: Not on file  . Number of children: Not on file  . Years of education: Not on file  . Highest education level: Not on file  Occupational History  . Not on file  Social Needs  . Financial resource strain: Not on file  . Food insecurity:    Worry: Not on file    Inability: Not on file  . Transportation needs:    Medical: Not on file    Non-medical: Not on file  Tobacco Use  . Smoking status: Former Smoker    Last attempt to quit: 05/15/2013  Years since quitting: 4.5  . Smokeless tobacco: Never Used  . Tobacco comment: E-sig " pnce in a while"  Substance and Sexual Activity  . Alcohol use: No  . Drug use: No  . Sexual activity: Not on file  Lifestyle  . Physical activity:    Days per week: Not on file    Minutes per session: Not on file  . Stress: Not on file  Relationships  . Social connections:    Talks on phone: Not on file    Gets together: Not on  file    Attends religious service: Not on file    Active member of club or organization: Not on file    Attends meetings of clubs or organizations: Not on file    Relationship status: Not on file  Other Topics Concern  . Not on file  Social History Narrative  . Not on file   Additional Social History:   Sleep: Improving  Appetite:  Fair  Current Medications: Current Facility-Administered Medications  Medication Dose Route Frequency Provider Last Rate Last Dose  . acetaminophen (TYLENOL) tablet 650 mg  650 mg Oral Q6H PRN Nanci Pina, FNP   650 mg at 11/23/17 2120  . alum & mag hydroxide-simeth (MAALOX/MYLANTA) 200-200-20 MG/5ML suspension 30 mL  30 mL Oral Q4H PRN Starkes, Takia S, FNP      . benzocaine (ORAJEL) 10 % mucosal gel   Mouth/Throat TID PRN Laverle Hobby, PA-C      . divalproex (DEPAKOTE ER) 24 hr tablet 1,000 mg  1,000 mg Oral Daily Lindon Romp A, NP   1,000 mg at 11/24/17 0804  . gabapentin (NEURONTIN) capsule 100 mg  100 mg Oral TID Jaelin Fackler, Myer Peer, MD   100 mg at 11/24/17 0804  . loperamide (IMODIUM) capsule 2 mg  2 mg Oral PRN Lindon Romp A, NP   2 mg at 11/22/17 1159  . magnesium hydroxide (MILK OF MAGNESIA) suspension 30 mL  30 mL Oral Daily PRN Nanci Pina, FNP      . ondansetron [facility]) tablet 4 mg  4 mg Oral Q6H PRN Mordecai Maes, NP   4 mg at 11/21/17 1202  . QUEtiapine (SEROQUEL) tablet 200 mg  200 mg Oral QHS Pedro Whiters, Myer Peer, MD   200 mg at 11/23/17 2120    Lab Results:  Results for orders placed or performed during the hospital encounter of 11/14/17 (from the past 48 hour(s))  Basic metabolic panel     Status: Abnormal   Collection Time: 11/22/17  6:29 PM  Result Value Ref Range   Sodium 139 135 - 145 mmol/L   Potassium 4.5 3.5 - 5.1 mmol/L   Chloride 101 98 - 111 mmol/L    Comment: Please note change in reference range.   CO2 30 22 - 32 mmol/L   Glucose, Bld 146 (H) 70 - 99 mg/dL    Comment: Please note change in reference  range.   BUN 10 6 - 20 mg/dL    Comment: Please note change in reference range.   Creatinine, Ser 0.89 0.44 - 1.00 mg/dL   Calcium 8.9 8.9 - 10.3 mg/dL   GFR calc non Af Amer >60 >60 mL/min   GFR calc Af Amer >60 >60 mL/min    Comment: (NOTE) The eGFR has been calculated using the CKD EPI equation. This calculation has not been validated in all clinical situations. eGFR's persistently <60 mL/min signify possible Chronic Kidney Disease.    Anion gap 8  5 - 15    Comment: Performed at [facility], [place] [address] [place], [place] [address]  CBC with Differential/Platelet     Status: None   Collection Time: 11/22/17  6:29 PM  Result Value Ref Range   WBC 7.5 4.0 - 10.5 K/uL   RBC 4.59 3.87 - 5.11 MIL/uL   Hemoglobin 14.5 12.0 - 15.0 g/dL   HCT 43.5 36.0 - 46.0 %   MCV 94.8 78.0 - 100.0 fL   MCH 31.6 26.0 - 34.0 pg   MCHC 33.3 30.0 - 36.0 g/dL   RDW 13.0 11.5 - 15.5 %   Platelets 167 150 - 400 K/uL   Neutrophils Relative % 76 %   Neutro Abs 5.7 1.7 - 7.7 K/uL   Lymphocytes Relative 14 %   Lymphs Abs 1.1 0.7 - 4.0 K/uL   Monocytes Relative 9 %   Monocytes Absolute 0.7 0.1 - 1.0 K/uL   Eosinophils Relative 1 %   Eosinophils Absolute 0.1 0.0 - 0.7 K/uL   Basophils Relative 0 %   Basophils Absolute 0.0 0.0 - 0.1 K/uL    Comment: Performed at [facility], [place] [address] [place],  [address]    Blood Alcohol level:  Lab Results  Component Value Date   ETH <10 11/13/2017   ETH 179 (H) 75/64/3329    Metabolic Disorder Labs: Lab Results  Component Value Date   HGBA1C 5.4 11/22/2017   MPG 108.28 11/22/2017   MPG 99.67 11/14/2017   No results found for: PROLACTIN Lab Results  Component Value Date   CHOL 287 (H) 11/22/2017   TRIG 198 (H) 11/22/2017   HDL 49 11/22/2017   CHOLHDL 5.9 11/22/2017   VLDL 40 11/22/2017   LDLCALC 198 (H) 11/22/2017   LDLCALC 106 (H) 11/14/2017    Physical Findings: AIMS: Facial and Oral  Movements Muscles of Facial Expression: None, normal Lips and Perioral Area: None, normal Jaw: None, normal Tongue: None, normal,Extremity Movements Upper (arms, wrists, hands, fingers): None, normal Lower (legs, knees, ankles, toes): None, normal, Trunk Movements Neck, shoulders, hips: None, normal, Overall Severity Severity of abnormal movements (highest score from questions above): None, normal Incapacitation due to abnormal movements: None, normal Patient's awareness of abnormal movements (rate only patient's report): No Awareness, Dental Status Current problems with teeth and/or dentures?: No Does patient usually wear dentures?: No  CIWA:  CIWA-Ar Total: 1 COWS:  COWS Total Score: 1  Musculoskeletal: Strength & Muscle Tone: within normal limits Gait & Station: normal Patient leans: N/A  Psychiatric Specialty Exam: Physical Exam  Nursing note and vitals reviewed. Constitutional: She is oriented to person, place, and time. She appears well-developed and well-nourished.  HENT:  Head: Normocephalic and atraumatic.  Respiratory: Effort normal.  Neurological: She is alert and oriented to person, place, and time.    ROS denies current headache, no chest pain, no shortness of breath, no vomiting , diarrhea has resolved, no fever, no chills Denies dizziness or lightheadedness today  Blood pressure 105/60, pulse 80, temperature 97.7 F (36.5 C), temperature source Oral, resp. rate 16, height '5\' 8"'$  (1.727 m), weight 71.2 kg (157 lb), SpO2 (!) 84 %.Body mass index is 23.87 kg/m.  Repeat BP ( manual)  100/60 pulse 96   General Appearance: Fairly Groomed  Eye Contact:  Good  Speech:  Normal Rate-not pressured  Volume:  Normal  Mood:  Describes vague depression, no longer feels "manic"  Affect:  Vaguely constricted, anxious, improves with support during  session, not expansive or overtly irritable  Thought Process:  Linear and Descriptions of Associations: Intact-no flight of ideations   Orientation:  Full (Time, Place, and Person)-Fully alert and attentive  Thought Content:  No hallucinations, no delusions, not internally preoccupied  Suicidal Thoughts:  No denies suicidal or self injurious ideations, denies homicidal or violent ideations.  Specifically also denies any homicidal or violent ideations towards her brother  Homicidal Thoughts:  No  Memory: recent and remote grossly intact  Judgement:  Other:  improving   Insight:  improving   Psychomotor Activity:  Normal- no psychomotor agitation  Concentration:  Concentration: Good and Attention Span: Good  Recall:  Good  Fund of Knowledge:  Good  Language:  Good  Akathisia:  Negative  Handed:  Right  AIMS (if indicated):     Assets:  Communication Skills Desire for Improvement Housing Resilience Social Support  ADL's:  Intact  Cognition:  WNL  Sleep:  Number of Hours: 6   Assessment -patient remains vaguely anxious and depressed but denies suicidal ideations.  Yesterday had reported feeling "manic" with subjective racing thoughts, but today denies these symptoms and is not currently presenting with symptoms of mania or hypomania (no pressured speech, no flight of ideations, no irritable or expansive affect).  Denies suicidal ideations and remains future oriented but apprehensive about returning home.  Low blood pressure/orthostasis has improved as diarrhea resolved and Seroquel dose was decreased.  Currently vitals are stable.  Treatment Plan Summary: Treatment Plan reviewed as below today 7/13 Encourage group and milieu participation to work on coping skills and symptom reduction Continue Seroquel  200 mgrs QHS for mood disorder Continue Depakote ER 1000 mgrs QDAY for mood disorder  Continue Neurontin  100 mgrs TID for anxiety   Continue to encourage PO fluids  Treatment team working on disposition planning options Jenne Campus, MD 11/24/2017, 10:40 AM    Patient ID: [name], female   DOB:  [birth year], 62 y.o.   MRN: [id number]

## 2017-11-25 LAB — VALPROIC ACID LEVEL: VALPROIC ACID LVL: 49 ug/mL — AB (ref 50.0–100.0)

## 2017-11-25 MED ORDER — DIVALPROEX SODIUM ER 500 MG PO TB24
1500.0000 mg | ORAL_TABLET | Freq: Every day | ORAL | Status: DC
  Administered 2017-11-26 – 2017-11-28 (×3): 1500 mg via ORAL
  Filled 2017-11-25 (×3): qty 3
  Filled 2017-11-25 (×2): qty 21
  Filled 2017-11-25: qty 3

## 2017-11-25 MED ORDER — DIVALPROEX SODIUM ER 500 MG PO TB24
1250.0000 mg | ORAL_TABLET | Freq: Every day | ORAL | Status: DC
  Filled 2017-11-25: qty 1

## 2017-11-25 MED ORDER — QUETIAPINE FUMARATE 50 MG PO TABS
250.0000 mg | ORAL_TABLET | Freq: Every day | ORAL | Status: DC
  Administered 2017-11-25 – 2017-11-27 (×3): 250 mg via ORAL
  Filled 2017-11-25 (×6): qty 1

## 2017-11-25 NOTE — Progress Notes (Signed)
Pt presents with a flat affect and depressed mood. Pt rated on her self inventory sheet: depression 6/10, anxiety 5/10, hopelessness 6/10, sleep -fair and appetite-fair. Pt denies SI/HI. Pt stated goal for today, "work on discharge plans". Pt expressed to writer that she's unsure if she's ready to return home because she doesn't know what to expect. Writer encouraged pt to work on discharge plan today and discuss it with the MD or CSW. Pt compliant with taking meds and denies any side effects. No concerns verbalized by pt.  Orders reviewed with pt. Verbal support provided. V/s assessed.  Pt encouraged to attend groups. 15 minute checks performed for safety. Pt self inventory sheet completed.   Pt compliant with tx plan.

## 2017-11-25 NOTE — [facility] Group Notes (Signed)
[facility] LCSW Group Therapy Note  11/25/2017  10:00-11:00AM  Type of Therapy and Topic:  Group Therapy:  Being Your Own Support  Participation Level:  Active   Description of Group:  Patients in this group were introduced to the concept that self-support is an essential part of recovery.  A song entitled "My Own Hero" was played and a group discussion ensued in which patients stated they could relate to the song and it inspired them to realize they have be willing to help themselves in order to succeed, because other people cannot achieve sobriety or stability for them.  We discussed adding a variety of healthy supports to address the various needs in their lives.  A song was played called "I Know Where I've Been" toward the end of group and used to conduct an inspirational wrap-up to group of remembering how far they have already come in their journey.  Therapeutic Goals: 1)  demonstrate the importance of being a part of one's own support system 2)  discuss reasons people in one's life may eventually be unable to be continually supportive  3)  identify the patient's current support system and   4)  elicit commitments to add healthy supports and to become more conscious of being self-supportive   Summary of Patient Progress:  The patient expressed that her older sister knows her very well and if she does not call her sister for a few days, her sister will get in the car and drive over to see her.  She did not talk a great deal but was very attentive throughout.   Therapeutic Modalities:   Motivational Interviewing Activity  Maretta Los

## 2017-11-25 NOTE — Plan of Care (Signed)
  Problem: Activity: Goal: Sleeping patterns will improve Outcome: Progressing Note:  Pt slept 6.75 hours last night.

## 2017-11-25 NOTE — Plan of Care (Signed)
D: Pt denies SI/HI/AV hallucinations. Patient is in a pleasant mood.  Pt goal for today is to work on her discharge planning. A: Pt was offered support and encouragement. Pt was given scheduled medications. Pt was encourage to attend groups. Q 15 minute checks were done for safety.  R:Pt attends groups and interacts well with peers and staff. Pt is taking medication. Pt has no complaints.Pt receptive to treatment and safety maintained on unit.    Problem: Health Behavior/Discharge Planning: Goal: Compliance with therapeutic regimen will improve Outcome: Progressing   Problem: Medication: Goal: Compliance with prescribed medication regimen will improve Outcome: Progressing Note:  Patient is compliant with medication regimen   Problem: Safety: Goal: Periods of time without injury will increase Note:  Patient denies SI

## 2017-11-25 NOTE — Progress Notes (Signed)
The patient reports that she had a good day since she was able to go back to bed following breakfast. She also reports that her blood pressure has decreased to within normal limits. Her goal for tomorrow is to go home.

## 2017-11-25 NOTE — Progress Notes (Signed)
[facility] MD Progress Note  11/25/2017 4:31 PM [name]  MRN:  [id number] Subjective: Patient reports ongoing anxiety, states that she intermittently feels "manic".  We have explored this feeling and she describes it more as a subjective feeling of tension and anxiety than actual manic symptoms.  She does have a well-established history of bipolar disorder however.  At this time denies suicidal ideations, is future oriented , planning on returning home at discharge but concerned because apparently her brother has not yet left the house.  She has described her brother is alcoholic, disruptive when he drinks, and has described this as a major contributor to her depression. Diarrhea has entirely resolved.  Currently denies dizziness or lightheadedness.   Objective : I have reviewed the chart notes and have met with patient. Patient presents with overall improvement.  Currently remains anxious, mainly about stressors as above.  States " I know I cannot hiding here forever, I would have to face things, hopefully he will leave soon".  No current overt manic symptoms are noted or reported-she does not present with pressured speech or psychomotor restlessness or expansive/irritable affect or increased goal-directed behaviors.  Labs reviewed-valproic acid serum level subtherapeutic at 49.  Of note, recent reports decreased quality of sleep/some insomnia following Seroquel does taper.  Seroquel was decreased in dose due to concerns about orthostatic hypotension.   Visible on unit, interactive with peers. Denies suicidal ideations.   Principal Problem: Reports history of Bipolar Disorder/Depressed  Diagnosis:   Patient Active Problem List   Diagnosis Date Noted  . MDD (major depressive disorder), recurrent severe, without psychosis ([place]) [F33.2] 11/14/2017  . Severe bipolar I disorder, most recent episode depressed ([place]) [F31.4]   . 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 Lung cancer ([place]) [C34.90] 04/24/2011   Total Time spent with patient: 20 minutes  Past Psychiatric History: See admission H&P  Past Medical History:  Past Medical History:  Diagnosis Date  . Allergy   . Depression   . Drug abuse ([place])    history of, went to rehab 2015  . GERD (gastroesophageal reflux disease)   . Lung cancer ([facility])    lung ca dx 11/11- right upper lobe    Past Surgical History:  Procedure Laterality Date  . ANKLE FRACTURE SURGERY Left   . LUNG LOBECTOMY Right 2011   RUL removed for lung cancer  . STYLOID PROCESS EXCISION Left 10/16/2014   Procedure: EXCISION LEFT STYLOID PROCESS;  Surgeon: Rozetta Nunnery, MD;  Location: [facility];  Service: ENT;  Laterality: Left;  . TONSILLECTOMY Bilateral 10/16/2014   Procedure: BILATERAL TONSILLECTOMY;  Surgeon: Rozetta Nunnery, MD;  Location: [place] [facility];  Service: ENT;  Laterality: Bilateral;  . TUBAL LIGATION     Family History:  Family History  Problem Relation Age of Onset  . Cancer Mother   . Cancer Father    Family Psychiatric  History: See admission H&P Social History:  Social History   Substance and Sexual Activity  Alcohol Use No     Social History   Substance and Sexual Activity  Drug Use No    Social History   Socioeconomic History  . Marital status: Single    Spouse name: Not on file  . Number of children: Not on file  . Years of education: Not on file  . Highest education level: Not on file  Occupational History  . Not on file  Social Needs  . Financial resource strain: Not on file  . Food insecurity:    Worry: Not on file    Inability: Not on file  . Transportation needs:    Medical: Not on file    Non-medical: Not on file  Tobacco Use  . Smoking status: Former Smoker    Last attempt to quit: 05/15/2013    Years since quitting: 4.5  . Smokeless tobacco: Never Used  . Tobacco comment: E-sig " pnce in a while"   Substance and Sexual Activity  . Alcohol use: No  . Drug use: No  . Sexual activity: Not on file  Lifestyle  . Physical activity:    Days per week: Not on file    Minutes per session: Not on file  . Stress: Not on file  Relationships  . Social connections:    Talks on phone: Not on file    Gets together: Not on file    Attends religious service: Not on file    Active member of club or organization: Not on file    Attends meetings of clubs or organizations: Not on file    Relationship status: Not on file  Other Topics Concern  . Not on file  Social History Narrative  . Not on file   Additional Social History:   Sleep: Fair  Appetite:  Improving  Current Medications: Current Facility-Administered Medications  Medication Dose Route Frequency Provider Last Rate Last Dose  . acetaminophen (TYLENOL) tablet 650 mg  650 mg Oral Q6H PRN Nanci Pina, FNP   650 mg at 11/23/17 2120  . alum & mag hydroxide-simeth (MAALOX/MYLANTA) 200-200-20 MG/5ML suspension 30 mL  30 mL Oral Q4H PRN Starkes, Takia S, FNP      . benzocaine (ORAJEL) 10 % mucosal gel   Mouth/Throat TID PRN Laverle Hobby, PA-C      . [START ON 11/26/2017] divalproex (DEPAKOTE ER) 24 hr tablet 1,500 mg  1,500 mg Oral Daily Cobos, Fernando A, MD      . gabapentin (NEURONTIN) capsule 100 mg  100 mg Oral TID Cobos, Myer Peer, MD   100 mg at 11/25/17 1132  . loperamide (IMODIUM) capsule 2 mg  2 mg Oral PRN Lindon Romp A, NP   2 mg at 11/22/17 1159  . magnesium hydroxide (MILK OF MAGNESIA) suspension 30 mL  30 mL Oral Daily PRN Nanci Pina, FNP      . ondansetron [facility]) tablet 4 mg  4 mg Oral Q6H PRN Mordecai Maes, NP   4 mg at 11/21/17 1202  . QUEtiapine (SEROQUEL) tablet 250 mg  250 mg Oral QHS Cobos, Myer Peer, MD        Lab Results:  Results for orders placed or performed during the hospital encounter of 11/14/17 (from the past 48 hour(s))  Valproic acid level     Status: Abnormal   Collection Time:  11/25/17  6:30 AM  Result Value Ref Range   Valproic Acid Lvl 49 (L) 50.0 - 100.0 ug/mL    Comment: Performed at [facility], [place] [address], [place], [place] [address]    Blood Alcohol level:  Lab Results  Component Value Date   ETH <10 11/13/2017   ETH 179 (H) 25/09/3974    Metabolic Disorder Labs: Lab Results  Component Value Date   HGBA1C 5.4 11/22/2017   MPG 108.28 11/22/2017   MPG 99.67 11/14/2017   No results found for: PROLACTIN Lab Results  Component Value Date  CHOL 287 (H) 11/22/2017   TRIG 198 (H) 11/22/2017   HDL 49 11/22/2017   CHOLHDL 5.9 11/22/2017   VLDL 40 11/22/2017   LDLCALC 198 (H) 11/22/2017   LDLCALC 106 (H) 11/14/2017    Physical Findings: AIMS: Facial and Oral Movements Muscles of Facial Expression: None, normal Lips and Perioral Area: None, normal Jaw: None, normal Tongue: None, normal,Extremity Movements Upper (arms, wrists, hands, fingers): None, normal Lower (legs, knees, ankles, toes): None, normal, Trunk Movements Neck, shoulders, hips: None, normal, Overall Severity Severity of abnormal movements (highest score from questions above): None, normal Incapacitation due to abnormal movements: None, normal Patient's awareness of abnormal movements (rate only patient's report): No Awareness, Dental Status Current problems with teeth and/or dentures?: No Does patient usually wear dentures?: No  CIWA:  CIWA-Ar Total: 1 COWS:  COWS Total Score: 1  Musculoskeletal: Strength & Muscle Tone: within normal limits Gait & Station: normal Patient leans: N/A  Psychiatric Specialty Exam: Physical Exam  Nursing note and vitals reviewed. Constitutional: She is oriented to person, place, and time. She appears well-developed and well-nourished.  HENT:  Head: Normocephalic and atraumatic.  Respiratory: Effort normal.  Neurological: She is alert and oriented to person, place, and time.    ROS denies current headache, no chest  pain, no shortness of breath, no vomiting , diarrhea has resolved, no fever, no chills Denies dizziness or lightheadedness today  Blood pressure 98/65, pulse 88, temperature 98.1 F (36.7 C), temperature source Oral, resp. rate 16, height '5\' 8"'$  (1.727 m), weight 71.2 kg (157 lb), SpO2 (!) 84 %.Body mass index is 23.87 kg/m.    General Appearance: Improving grooming  Eye Contact:  Good  Speech:  Normal Rate-not pressured  Volume:  Normal  Mood:  Less depressed, no current manic symptoms, vaguely anxious  Affect:  Anxious, affect tends to improve with support/reassurance  Thought Process:  Linear and Descriptions of Associations: Intact-no flight of ideations  Orientation:  Full (Time, Place, and Person)-Fully alert and attentive  Thought Content:  No hallucinations, no delusions, not internally preoccupied  Suicidal Thoughts:  No denies suicidal or self injurious ideations, denies homicidal or violent ideations.  Specifically also denies any homicidal or violent ideations towards her brother  Homicidal Thoughts:  No  Memory: recent and remote grossly intact  Judgement:  Other:  improving   Insight:  improving   Psychomotor Activity:  Normal- no psychomotor agitation  Concentration:  Concentration: Good and Attention Span: Good  Recall:  Good  Fund of Knowledge:  Good  Language:  Good  Akathisia:  Negative  Handed:  Right  AIMS (if indicated):     Assets:  Communication Skills Desire for Improvement Housing Resilience Social Support  ADL's:  Intact  Cognition:  WNL  Sleep:  Number of Hours: 6.75   Assessment - patient presents with partial improvement.  She is less depressed .  She denies suicidal ideations, and remains future oriented.  She describes feeling "manic" but further exploration of what she means by this suggests anxiety as the major issue, and she is not currently presenting with symptoms of mania or hypomania. Blood pressure improved, diarrhea resolved, no current  lightheadedness or dizziness.  She does state, however, that her sleep has decreased and she feels more restless at night following Seroquel taper.  Valproic acid level slightly subtherapeutic with no side effects at current dose Treatment Plan Summary: Treatment Plan reviewed as below today 7/14 Encourage group and milieu participation to work on Radiographer, therapeutic and  symptom reduction Increase Seroquel to 250 mgrs QHS for mood disorder Increase Depakote ER to  1250 mgrs QDAY for mood disorder  Continue Neurontin  100 mgrs TID for anxiety   Continue to encourage PO fluids  Treatment team working on disposition planning options Jenne Campus, MD 11/25/2017, 4:31 PM    Patient ID: [name], female   DOB: 1955-06-22, 62 y.o.   MRN: [id number]

## 2017-11-26 MED ORDER — QUETIAPINE FUMARATE 25 MG PO TABS
25.0000 mg | ORAL_TABLET | Freq: Two times a day (BID) | ORAL | Status: DC
  Filled 2017-11-26 (×4): qty 1

## 2017-11-26 MED ORDER — QUETIAPINE FUMARATE 25 MG PO TABS
25.0000 mg | ORAL_TABLET | Freq: Two times a day (BID) | ORAL | Status: DC
  Administered 2017-11-26 – 2017-11-28 (×4): 25 mg via ORAL
  Filled 2017-11-26: qty 1
  Filled 2017-11-26: qty 14
  Filled 2017-11-26: qty 1
  Filled 2017-11-26: qty 14
  Filled 2017-11-26 (×2): qty 1
  Filled 2017-11-26: qty 14
  Filled 2017-11-26 (×2): qty 1
  Filled 2017-11-26: qty 14
  Filled 2017-11-26 (×3): qty 1

## 2017-11-26 NOTE — Progress Notes (Signed)
Recreation Therapy Notes  Date: 7.15.19 Time: 0930 Location: [address] Dayroom  Group Topic: Stress Management  Goal Area(s) Addresses:  Patient will verbalize importance of using healthy stress management.  Patient will identify positive emotions associated with healthy stress management.   Intervention: Stress Management  Activity :  Guided Imagery.  LRT introduced the stress management technique of guided imagery.  LRT read a script that allowed patients to picture their peaceful place.  Patients were to follow along as the script was read to engage in the activity.  Education:  Stress Management, Discharge Planning.   Education Outcome: Acknowledges edcuation/In group clarification offered/Needs additional education  Clinical Observations/Feedback: Pt did not attend group.      Victorino Sparrow, LRT/CTRS         Victorino Sparrow A 11/26/2017 12:40 PM

## 2017-11-26 NOTE — Progress Notes (Addendum)
[facility] MD Progress Note  11/26/2017 2:14 PM [name]  MRN:  [id number]    Subjective: Awake alert and oriented x3.  Seen attending group sessions with active and engaged participation.  Reports on going anxiety symptoms.  Patient seen pacing throughout this assessment as she reports feeling "fidgety and jittery" in the inside all of the time.  Denies homicidal or suicidal ideations.  Denies auditory or visual hallucinations.  Reports a difficult time resting at night states she sleeps for about 4 to 5 hours and then she is back up and unable to go back to sleep for a few hours.  Chart review medications was initiated for Depakote and Seroquel.  Patient reports taking medication as prescribed and tolerating them well.  Will initiate Seroquel 25 mg p.o. twice daily for reported increased anxiety/mood stabilization.  Reports a fair appetite.  Reports she is hopeful to discharge on Wednesday as is the only day that her sister can pick her up, reports mild anxiety about discharging back home. Support encouragement reassurance was provided  History: per assessment note: Patient is a 62 year old female with a probable past psychiatric history significant for bipolar disorder, alcohol use disorder and depression who presented to the behavioral health hospital yesterday for admission.  She was unaccompanied.  Patient stated that she had worsening depression and worsening suicidal ideation over the last 3 months.  The patient stated she had stopped taking her current medications because she did not like the way it made her feel.  [facility], but was last seen over 6 months ago.  Her last psychiatric hospitalization was at [facility] prior to that.  She does not recall what medication she was taking at that time.  Review of the electronic medical record showed that is least as far back as 2013 she had been prescribed fluoxetine, Seroquel, Valium and BuSpar.  The patient stated that she had  not had any alcohol in 3 to 6 months.  She stated she was not sleeping.  She reported decreased appetite, helplessness, hopelessness and worthlessness.  She did admit to a history of manic episodes in the past.  She recalled that at least 1 of the antidepressant medicines in the past led to decreased sleep, increased goal-directed activity as well as euphoria.  She was admitted to the hospital for evaluation   Principal Problem: Reports history of Bipolar Disorder/Depressed  Diagnosis:   Patient Active Problem List   Diagnosis Date Noted  . MDD (major depressive disorder), recurrent severe, without psychosis ([place]) [F33.2] 11/14/2017  . Severe bipolar I disorder, most recent episode depressed ([place]) [F31.4]   . 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 Lung cancer ([place]) [C34.90] 04/24/2011   Total Time spent with patient: 20 minutes  Past Psychiatric History: See admission H&P  Past Medical History:  Past Medical History:  Diagnosis Date  . Allergy   . Depression   . Drug abuse ([place])    history of, went to rehab 2015  . GERD (gastroesophageal reflux disease)   . Lung cancer ([place])    lung ca dx 11/11- right upper lobe    Past Surgical History:  Procedure Laterality Date  . ANKLE FRACTURE SURGERY Left   . LUNG LOBECTOMY Right 2011   RUL removed for lung cancer  . STYLOID PROCESS EXCISION Left 10/16/2014   Procedure: EXCISION LEFT STYLOID PROCESS;  Surgeon: Rozetta Nunnery, MD;  Location: [facility];  Service: ENT;  Laterality: Left;  . TONSILLECTOMY Bilateral 10/16/2014   Procedure: BILATERAL TONSILLECTOMY;  Surgeon: Rozetta Nunnery, MD;  Location: [facility];  Service: ENT;  Laterality: Bilateral;  . TUBAL LIGATION     Family History:  Family History  Problem Relation Age of Onset  . Cancer Mother   . Cancer Father    Family Psychiatric  History: See admission H&P Social  History:  Social History   Substance and Sexual Activity  Alcohol Use No     Social History   Substance and Sexual Activity  Drug Use No    Social History   Socioeconomic History  . Marital status: Single    Spouse name: Not on file  . Number of children: Not on file  . Years of education: Not on file  . Highest education level: Not on file  Occupational History  . Not on file  Social Needs  . Financial resource strain: Not on file  . Food insecurity:    Worry: Not on file    Inability: Not on file  . Transportation needs:    Medical: Not on file    Non-medical: Not on file  Tobacco Use  . Smoking status: Former Smoker    Last attempt to quit: 05/15/2013    Years since quitting: 4.5  . Smokeless tobacco: Never Used  . Tobacco comment: E-sig " pnce in a while"  Substance and Sexual Activity  . Alcohol use: No  . Drug use: No  . Sexual activity: Not on file  Lifestyle  . Physical activity:    Days per week: Not on file    Minutes per session: Not on file  . Stress: Not on file  Relationships  . Social connections:    Talks on phone: Not on file    Gets together: Not on file    Attends religious service: Not on file    Active member of club or organization: Not on file    Attends meetings of clubs or organizations: Not on file    Relationship status: Not on file  Other Topics Concern  . Not on file  Social History Narrative  . Not on file   Additional Social History:   Sleep: Fair  Appetite:  Improving  Current Medications: Current Facility-Administered Medications  Medication Dose Route Frequency Provider Last Rate Last Dose  . acetaminophen (TYLENOL) tablet 650 mg  650 mg Oral Q6H PRN Nanci Pina, FNP   650 mg at 11/26/17 1013  . alum & mag hydroxide-simeth (MAALOX/MYLANTA) 200-200-20 MG/5ML suspension 30 mL  30 mL Oral Q4H PRN Starkes, Takia S, FNP      . benzocaine (ORAJEL) 10 % mucosal gel   Mouth/Throat TID PRN Laverle Hobby, PA-C      .  divalproex (DEPAKOTE ER) 24 hr tablet 1,500 mg  1,500 mg Oral Daily Tonny Isensee, Myer Peer, MD   1,500 mg at 11/26/17 0819  . gabapentin (NEURONTIN) capsule 100 mg  100 mg Oral TID Landree Fernholz, Myer Peer, MD   100 mg at 11/26/17 1203  . loperamide (IMODIUM) capsule 2 mg  2 mg Oral PRN Lindon Romp A, NP   2 mg at 11/22/17 1159  . magnesium hydroxide (MILK OF MAGNESIA) suspension 30 mL  30 mL Oral Daily PRN Nanci Pina, FNP      . ondansetron [facility]) tablet 4 mg  4 mg Oral Q6H PRN Mordecai Maes, NP   4 mg at 11/21/17  1202  . QUEtiapine (SEROQUEL) tablet 25 mg  25 mg Oral BID Derrill Center, NP      . QUEtiapine (SEROQUEL) tablet 250 mg  250 mg Oral QHS Johntavious Francom, Myer Peer, MD   250 mg at 11/25/17 2104    Lab Results:  Results for orders placed or performed during the hospital encounter of 11/14/17 (from the past 48 hour(s))  Valproic acid level     Status: Abnormal   Collection Time: 11/25/17  6:30 AM  Result Value Ref Range   Valproic Acid Lvl 49 (L) 50.0 - 100.0 ug/mL    Comment: Performed at [facility], [place] [address] [place], [place] [address]    Blood Alcohol level:  Lab Results  Component Value Date   ETH <10 11/13/2017   ETH 179 (H) 41/96/2229    Metabolic Disorder Labs: Lab Results  Component Value Date   HGBA1C 5.4 11/22/2017   MPG 108.28 11/22/2017   MPG 99.67 11/14/2017   No results found for: PROLACTIN Lab Results  Component Value Date   CHOL 287 (H) 11/22/2017   TRIG 198 (H) 11/22/2017   HDL 49 11/22/2017   CHOLHDL 5.9 11/22/2017   VLDL 40 11/22/2017   LDLCALC 198 (H) 11/22/2017   LDLCALC 106 (H) 11/14/2017    Physical Findings: AIMS: Facial and Oral Movements Muscles of Facial Expression: None, normal Lips and Perioral Area: None, normal Jaw: None, normal Tongue: None, normal,Extremity Movements Upper (arms, wrists, hands, fingers): None, normal Lower (legs, knees, ankles, toes): None, normal, Trunk Movements Neck, shoulders, hips:  None, normal, Overall Severity Severity of abnormal movements (highest score from questions above): None, normal Incapacitation due to abnormal movements: None, normal Patient's awareness of abnormal movements (rate only patient's report): No Awareness, Dental Status Current problems with teeth and/or dentures?: No Does patient usually wear dentures?: No  CIWA:  CIWA-Ar Total: 1 COWS:  COWS Total Score: 1  Musculoskeletal: Strength & Muscle Tone: within normal limits Gait & Station: normal Patient leans: N/A  Psychiatric Specialty Exam: Physical Exam  Nursing note and vitals reviewed. Constitutional: She is oriented to person, place, and time. She appears well-developed and well-nourished.  HENT:  Head: Normocephalic and atraumatic.  Respiratory: Effort normal.  Neurological: She is alert and oriented to person, place, and time.    Review of Systems  Psychiatric/Behavioral: Positive for depression (improving ). Negative for hallucinations and suicidal ideas. The patient is nervous/anxious.   All other systems reviewed and are negative.   Blood pressure 118/66, pulse 75, temperature (!) 97.4 F (36.3 C), temperature source Oral, resp. rate 16, height 5\' 8"  (1.727 m), weight 71.2 kg (157 lb), SpO2 (!) 84 %.Body mass index is 23.87 kg/m.    General Appearance: Casual  Eye Contact:  Good  Speech:  Normal Rate  Volume:  Normal  Mood:  Less depressed, no current manic symptoms, vaguely anxious  Affect:  Anxious, affect tends to improve with support/reassurance  Thought Process:  Linear-  Orientation:  Full (Time, Place, and Person)  Thought Content:  Hallucinations: None  Suicidal Thoughts:  No   Homicidal Thoughts:  No  Memory: recent and remote grossly intact  Judgement:  Other:  improving   Insight:  improving   Psychomotor Activity:  Normal  Concentration:  Concentration: Good and Attention Span: Good  Recall:  Good  Fund of Knowledge:  Good  Language:  Good   Akathisia:  Negative  Handed:  Right  AIMS (if indicated):  Assets:  Communication Skills Desire for Improvement Housing Resilience Social Support  ADL's:  Intact  Cognition:  WNL  Sleep:  Number of Hours: 6.75    Treatment Plan Summary: Daily contact with patient to assess and evaluate symptoms and progress in treatment and Medication management  Continue with current treatment plan on 11/26/2017 as listed below except for noted  Continue Seroquel to 250 mgrs QHS for mood disorder Initiated Seroquel 25 mg p.o. twice daily for mood stabilization Continue Depakote ER to  1250 mgrs QDAY for mood disorder  Continue Neurontin  100 mgrs TID for anxiety    Continue to encourage PO fluids  Treatment team working on disposition planning options Encourage group and milieu participation to work on Radiographer, therapeutic and symptom reduction  Derrill Center, NP 11/26/2017, 2:14 PM    ..Agree with NP Progress Note

## 2017-11-26 NOTE — [facility] Group Notes (Signed)
[facility] LCSW Group Therapy Note  Date/Time: 11/26/17, 1315  Type of Therapy and Topic:  Group Therapy:  Overcoming Obstacles  Participation Level:  minimal  Description of Group:    In this group patients will be encouraged to explore what they see as obstacles to their own wellness and recovery. They will be guided to discuss their thoughts, feelings, and behaviors related to these obstacles. The group will process together ways to cope with barriers, with attention given to specific choices patients can make. Each patient will be challenged to identify changes they are motivated to make in order to overcome their obstacles. This group will be process-oriented, with patients participating in exploration of their own experiences as well as giving and receiving support and challenge from other group members.  Therapeutic Goals: 1. Patient will identify personal and current obstacles as they relate to admission. 2. Patient will identify barriers that currently interfere with their wellness or overcoming obstacles.  3. Patient will identify feelings, thought process and behaviors related to these barriers. 4. Patient will identify two changes they are willing to make to overcome these obstacles:    Summary of Patient Progress: Pt shared that employment and finances are current obstacles in her life.  Pt did not participate much during group discussion but was attentive.      Therapeutic Modalities:   Cognitive Behavioral Therapy Solution Focused Therapy Motivational Interviewing Relapse Prevention Therapy  Lurline Idol, LCSW

## 2017-11-26 NOTE — Progress Notes (Signed)
D: Pt was in dayroom upon initial approach.  Pt presents with appropriate affect and mood.  Her goal today was to "try to go home, now we're looking at Wednesday."  Pt denies SI/HI, denies hallucinations, reports upper R tooth pain of 8/10.  Pt has been visible in milieu interacting with peers and staff appropriately.  Pt attended evening group.   A: Introduced self to pt.  Actively listened to pt and offered support and encouragement. Medication administered per order.  PRN medication administered for pain.  Q15 minute safety checks maintained.  R: Pt is safe on the unit.  Pt is compliant with medications.  Pt verbally contracts for safety.  Will continue to monitor and assess.

## 2017-11-26 NOTE — Progress Notes (Signed)
Patient ID: [name], female   DOB: [birth year], 62 y.o.   MRN: [id number]   D: Patient denies SI/HI and auditory and visual hallucinations. Patient has a depressed mood and affect.Patient has been at the nurses station a large part of the day. Numerous somatic complaints and needs.  A: Patient given emotional support from RN. Patient given medications per MD orders. Patient encouraged to attend groups and unit activities. Patient encouraged to come to staff with any questions or concerns.  R: Patient remains cooperative and appropriate. Will continue to monitor patient for safety.

## 2017-11-27 DIAGNOSIS — R45851 Suicidal ideations: Secondary | ICD-10-CM

## 2017-11-27 NOTE — Plan of Care (Signed)
Problem: Health Behavior/Discharge Planning: Goal: Compliance with treatment plan for underlying cause of condition will improve Intervention: Patient encouraged to take medications as prescribed and attend groups. Patient encouraged to be active in their recovery. Outcome: Patient is attending groups, taking medications as prescribed and complying with goals of treatment. 11/27/2017 10:09 AM - Progressing by Annia Friendly, RN   Problem: Safety: Goal: Periods of time without injury will increase Intervention: Patient contracts for safety on the unit. Moderate fall risk precautions in place. Safety monitored with q15 minute checks. Outcome: Patient remains safe on the unit at this time. 11/27/2017 10:09 AM - Progressing by Annia Friendly, RN

## 2017-11-27 NOTE — Progress Notes (Signed)
D: Pt was in the dayroom talking to peers upon initial approach.  Pt presents with appropriate affect and mood.  She describes her day as "good" and reports she is "going home in the morning."  When asked if she feels safe with this plan, pt states "kinda sorta."  Pt denies SI/HI, denies hallucinations, denies pain.  Pt has been visible in milieu interacting with peers and staff appropriately.  Pt attended evening group.    A: Actively listened to pt and offered support and encouragement. Medication administered per order.  Encouraged pt to voice any concerns related to discharge to provider tomorrow and she agrees to do so.  Q15 minute safety checks maintained.  R: Pt is safe on the unit.  Pt is compliant with medication.  Pt verbally contracts for safety.  Will continue to monitor and assess.

## 2017-11-27 NOTE — Progress Notes (Signed)
Recreation Therapy Notes  Animal-Assisted Activity (AAA) Program Checklist/Progress Notes Patient Eligibility Criteria Checklist & Daily Group note for Rec Tx Intervention  Date: 7.16.19 Time: 31 Location: [address]   AAA/T Program Assumption of Risk Form signed by Teacher, music or Parent Legal Guardian YES   Patient is free of allergies or sever asthma YES   Patient reports no fear of animals YES   Patient reports no history of cruelty to animals YES   Patient understands his/her participation is voluntary YES   Patient washes hands before animal contact  YES  Patient washes hands after animal contact  YES   Education: Contractor, Appropriate Animal Interaction   Education Outcome: Acknowledges understanding/In group clarification offered/Needs additional education.   Clinical Observations/Feedback: Pt did not attend activity.    Victorino Sparrow, LRT/CTRS         Ria Comment, Lieutenant Abarca A 11/27/2017 3:12 PM

## 2017-11-27 NOTE — BHH Group Notes (Signed)
[facility] LCSW Group Therapy Note  Date/Time: 11/27/17, 1315  Type of Therapy/Topic:  Group Therapy:  Feelings about Diagnosis  Participation Level:  Active   Mood:pleasant   Description of Group:    This group will allow patients to explore their thoughts and feelings about diagnoses they have received. Patients will be guided to explore their level of understanding and acceptance of these diagnoses. Facilitator will encourage patients to process their thoughts and feelings about the reactions of others to their diagnosis, and will guide patients in identifying ways to discuss their diagnosis with significant others in their lives. This group will be process-oriented, with patients participating in exploration of their own experiences as well as giving and receiving support and challenge from other group members.   Therapeutic Goals: 1. Patient will demonstrate understanding of diagnosis as evidence by identifying two or more symptoms of the disorder:  2. Patient will be able to express two feelings regarding the diagnosis 3. Patient will demonstrate ability to communicate their needs through discussion and/or role plays  Summary of Patient Progress:Pt was attentive to group discussion regarding symptoms of bipolar disorder, stigma, and acceptance of mental health diagnosis.  Pt made several good comments but mostly just listened.           Therapeutic Modalities:   Cognitive Behavioral Therapy Brief Therapy Feelings Identification   Lurline Idol, LCSW
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
## 2017-11-27 NOTE — Progress Notes (Signed)
Patient ID: [name], female   DOB: 1955-07-31, 62 y.o.   MRN: [id number]  Nursing Progress Note [id number]  Data: Patient presents calm, pleasant and cooperative this morning. Patient complaint with scheduled medications. Patient complains of R sided tooth pain this morning. Patient completed self-inventory sheet and rates depression, hopelessness, and anxiety 6,5,0 respectively. Patient rates their sleep and appetite as fair/fair respectively. Patient states goal for today is to "go home". Patient reports she will likely discharge tomorrow. Patient is seen attending groups and visible in the milieu. Patient currently denies SI/HI/AVH.   Action: Patient educated about and provided medication per provider's orders. Patient safety maintained with q15 min safety checks and frequent rounding. Moderate fall risk precautions in place. Emotional support given. 1:1 interaction and active listening provided. Patient encouraged to attend meals and groups. Patient encouraged to work on treatment plan and goals. Labs, vital signs and patient behavior monitored throughout shift.   Response: Patient agrees to come to staff if any thoughts of SI/HI develop or if patient develops intention of acting on thoughts. Patient remains safe on the unit at this time. Patient is interacting with peers appropriately on the unit. Will continue to support and monitor.

## 2017-11-27 NOTE — Progress Notes (Signed)
Adult Psychoeducational Group Note  Date:  11/27/2017 Time:  9:05 PM  Group Topic/Focus:  Wrap-Up Group:   The focus of this group is to help patients review their daily goal of treatment and discuss progress on daily workbooks.  Participation Level:  Active  Participation Quality:  Appropriate  Affect:  Appropriate  Cognitive:  Alert  Insight: Appropriate  Engagement in Group:  Engaged  Modes of Intervention:  Discussion  Additional Comments:  Patient stated having a decent day. Patient's goal for today was to talk with the doctor about a discharge plan. Patient met goal and will be discharging tomorrow.   Philip Kotlyar L Jousha Schwandt 11/27/2017, 9:05 PM

## 2017-11-27 NOTE — BHH Group Notes (Signed)
[facility] Group Notes:  (Nursing/MHT/Case Management/Adjunct)  Date:  11/27/2017  Time:  4:04 PM  Type of Therapy:  Nurse Education  Participation Level:  Minimal  Participation Quality:  Appropriate  Affect:  Appropriate  Cognitive:  Appropriate  Insight:  Improving  Engagement in Group:  Limited  Modes of Intervention:  Discussion, Education, Socialization and Support  Summary of Progress/Problems: Pt participated somewhat in relaxation exercise group.    Marya Landry 11/27/2017, 4:04 PM

## 2017-11-28 LAB — VALPROIC ACID LEVEL: VALPROIC ACID LVL: 68 ug/mL (ref 50.0–100.0)

## 2017-11-28 MED ORDER — QUETIAPINE FUMARATE 100 MG PO TABS
250.0000 mg | ORAL_TABLET | Freq: Every day | ORAL | Status: DC
  Filled 2017-11-28: qty 18

## 2017-11-28 MED ORDER — QUETIAPINE FUMARATE 25 MG PO TABS: 25.0000 mg | ORAL_TABLET | Freq: Two times a day (BID) | ORAL | 0 refills | Status: DC

## 2017-11-28 MED ORDER — QUETIAPINE FUMARATE 50 MG PO TABS: 250.0000 mg | ORAL_TABLET | Freq: Every day | ORAL | 0 refills | Status: DC

## 2017-11-28 MED ORDER — DIVALPROEX SODIUM ER 500 MG PO TB24: 1500.0000 mg | ORAL_TABLET | Freq: Every day | ORAL | 0 refills | Status: DC

## 2017-11-28 MED ORDER — GABAPENTIN 100 MG PO CAPS: 100.0000 mg | ORAL_CAPSULE | Freq: Three times a day (TID) | ORAL | 0 refills | Status: DC

## 2017-11-28 NOTE — BHH Suicide Risk Assessment (Signed)
[facility] Discharge Suicide Risk Assessment   Principal Problem: <principal problem not specified> Discharge Diagnoses:  Patient Active Problem List   Diagnosis Date Noted  . MDD (major depressive disorder), recurrent severe, without psychosis ([place]) [F33.2] 11/14/2017  . Severe bipolar I disorder, most recent episode depressed ([place]) [F31.4]   . 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 Lung cancer ([place]) [C34.90] 04/24/2011    Total Time spent with patient: 30 minutes  Musculoskeletal: Strength & Muscle Tone: within normal limits Gait & Station: normal Patient leans: N/A  Psychiatric Specialty Exam: Review of Systems  All other systems reviewed and are negative.   Blood pressure 103/84, pulse 98, temperature 97.8 F (36.6 C), resp. rate 16, height 5\' 8"  (1.727 m), weight 71.2 kg (157 lb), SpO2 (!) 84 %.Body mass index is 23.87 kg/m.  General Appearance: Casual  Eye Contact::  Fair  Speech:  Normal Rate409  Volume:  Normal  Mood:  Anxious  Affect:  Congruent  Thought Process:  Coherent  Orientation:  Full (Time, Place, and Person)  Thought Content:  Logical  Suicidal Thoughts:  No  Homicidal Thoughts:  No  Memory:  Immediate;   Fair Recent;   Fair Remote;   Fair  Judgement:  Intact  Insight:  Fair  Psychomotor Activity:  Increased  Concentration:  Fair  Recall:  Good  Fund of Knowledge:Good  Language: Good  Akathisia:  Negative  Handed:  Right  AIMS (if indicated):     Assets:  Communication Skills Desire for Improvement Resilience Social Support  Sleep:  Number of Hours: 6.5  Cognition: WNL  ADL's:  Intact   Mental Status Per Nursing Assessment::   On Admission:  Suicidal ideation indicated by patient  Demographic Factors:  Divorced or widowed, Caucasian, Low socioeconomic status and Unemployed  Loss Factors: NA  Historical Factors: Impulsivity  Risk Reduction Factors:   Sense of  responsibility to family, Positive social support and Positive therapeutic relationship  Continued Clinical Symptoms:  Schizophrenia:   Depressive state  Cognitive Features That Contribute To Risk:  None    Suicide Risk:  Minimal: No identifiable suicidal ideation.  Patients presenting with no risk factors but with morbid ruminations; may be classified as minimal risk based on the severity of the depressive symptoms  Follow-up Information    Services, [facility]. Go on 11/29/2017.   Why:  Appointment for medication management and therapy services is Thursday, 11/29/17 at 8:30 am. Please be sure to bring your Photo ID, SSN, insurance/household income information and any discharge paperwork from this hospitalization.  Contact information: [place] [phone]           Plan Of Care/Follow-up recommendations:  Activity:  ad lib  Sharma Covert, MD 11/28/2017, 10:14 AM

## 2017-11-28 NOTE — Discharge Summary (Addendum)
Physician Discharge Summary Note  Patient:  [name] is an 62 y.o., female MRN:  [id number] DOB:  [birth year] Patient phone:  [phone] (home)  Patient address:   [address] [place] [address],  Total Time spent with patient: 30 minutes  Date of Admission:  11/14/2017 Date of Discharge: 11/28/2017  Reason for Admission:  worsening depression and worsening suicidal ideation     Principal Problem: <principal problem not specified> Discharge Diagnoses: Patient Active Problem List   Diagnosis Date Noted  . MDD (major depressive disorder), recurrent severe, without psychosis ([place]) [F33.2] 11/14/2017  . Severe bipolar I disorder, most recent episode depressed ([place]) [F31.4]   . 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 Lung cancer [facility]) [C34.90] 04/24/2011    Past Psychiatric History: Patient admitted to several psychiatric hospitalizations.  The last was in 2018 at [facility].  She was unable to recall any of her medications she had taken in the past    Past Medical History:  Past Medical History:  Diagnosis Date  . Allergy   . Depression   . Drug abuse ([place])    history of, went to rehab 2015  . GERD (gastroesophageal reflux disease)   . Lung cancer ([place])    lung ca dx 11/11- right upper lobe    Past Surgical History:  Procedure Laterality Date  . ANKLE FRACTURE SURGERY Left   . LUNG LOBECTOMY Right 2011   RUL removed for lung cancer  . STYLOID PROCESS EXCISION Left 10/16/2014   Procedure: EXCISION LEFT STYLOID PROCESS;  Surgeon: Rozetta Nunnery, MD;  Location: [facility];  Service: ENT;  Laterality: Left;  . TONSILLECTOMY Bilateral 10/16/2014   Procedure: BILATERAL TONSILLECTOMY;  Surgeon: Rozetta Nunnery, MD;  Location: [facility];  Service: ENT;  Laterality: Bilateral;  . TUBAL LIGATION     Family History:  Family History  Problem Relation Age of Onset  .  Cancer Mother   . Cancer Father    Family Psychiatric  History: Denied   Social History:  Social History   Substance and Sexual Activity  Alcohol Use No     Social History   Substance and Sexual Activity  Drug Use No    Social History   Socioeconomic History  . Marital status: Single    Spouse name: Not on file  . Number of children: Not on file  . Years of education: Not on file  . Highest education level: Not on file  Occupational History  . Not on file  Social Needs  . Financial resource strain: Not on file  . Food insecurity:    Worry: Not on file    Inability: Not on file  . Transportation needs:    Medical: Not on file    Non-medical: Not on file  Tobacco Use  . Smoking status: Former Smoker    Last attempt to quit: 05/15/2013    Years since quitting: 4.5  . Smokeless tobacco: Never Used  . Tobacco comment: E-sig " pnce in a while"  Substance and Sexual Activity  . Alcohol use: No  . Drug use: No  . Sexual activity: Not on file  Lifestyle  . Physical activity:    Days per week: Not on file    Minutes per session: Not on file  . Stress: Not on file  Relationships  . Social connections:    Talks on phone: Not on file  Gets together: Not on file    Attends religious service: Not on file    Active member of club or organization: Not on file    Attends meetings of clubs or organizations: Not on file    Relationship status: Not on file  Other Topics Concern  . Not on file  Social History Narrative  . Not on file    Hospital Course:  Patient is seen and examined.  Patient is a 62 year old female with a probable past psychiatric history significant for bipolar disorder, alcohol use disorder and depression who presented to the behavioral health hospital yesterday for admission.  She was unaccompanied.  Patient stated that she had worsening depression and worsening suicidal ideation over the last 3 months.  The patient stated she had stopped taking her  current medications because she did not like the way it made her feel.  [facility], but was last seen over 6 months ago.  Her last psychiatric hospitalization was at [facility] prior to that.  She does not recall what medication she was taking at that time.  Review of the electronic medical record showed that is least as far back as 2013 she had been prescribed fluoxetine, Seroquel, Valium and BuSpar.  The patient stated that she had not had any alcohol in 3 to 6 months.  She stated she was not sleeping.  She reported decreased appetite, helplessness, hopelessness and worthlessness.  She did admit to a history of manic episodes in the past.  She recalled that at least 1 of the antidepressant medicines in the past led to decreased sleep, increased goal-directed activity as well as euphoria.  She was admitted to the hospital for evaluation and stabilization.  After the above admission assessment and during this hospital course, patients presenting symptoms were identified. Labs were reviewed and her UDS was (-). BAL showed no signs of toxicity.   Lipid panel, TSH and HgbA1c normal. CBC with diff normal. CMP glucose 146 otherwise normal.   Patient was treated and discharged with the following medications; Seroquel 25 mgrs BID and 250 mgrs QHS for mood disorder, Depakote ER 1500 mgrs QDAY for mood disorder. Neurontin  100 mgrs TID for anxiety. Valproic Acid Serum level 49 11/28/2017. Patient tolerated her treatment regimen without any adverse effects reported. She remained compliant with therapeutic milieu and actively participated in group counseling sessions. While on the unit, patient was able to verbalize learned coping skills for better management of depression and suicidal thoughts and to better maintain these thoughts and symptoms when returning home.  During the course of her hospitalization, patient had generally improved and stabilized since admission but  continued to report  anxiety. As she approaches discharge on multiple occasions she endorsed  increased anxiety and  focused a lot on discharge planning. She presented with increased apprehension and a feeling she was still not ready to discharge although she was both mentally and medically stable.  This is related at least in part to her brother still being at home. She described him as alcoholic and belligerent, abusive when drinking .She has identified this issue as a major stressor. Brother was to be moved from the home prior to discharge. Upon discharge, Tommi Emery denied any SI/HI, AVH, delusional thoughts, or paranoia..  Prior to discharge, Mardel's case was presented during treatment team meeting this morning. The team members were all in agreement that she was both mentally & medically stable to be discharged to continue mental health care on  an outpatient basis as noted below. She was provided with all the necessary information needed to make this appointment without problems.She was provided with prescriptions  of her [facility] discharge medications to resume following discharge as well as samples of these discharge medications. She left [facility] with all personal belongings in no apparent distress. Transportation per patients arrangement.  Physical Findings: AIMS: Facial and Oral Movements Muscles of Facial Expression: None, normal Lips and Perioral Area: None, normal Jaw: None, normal Tongue: None, normal,Extremity Movements Upper (arms, wrists, hands, fingers): None, normal Lower (legs, knees, ankles, toes): None, normal, Trunk Movements Neck, shoulders, hips: None, normal, Overall Severity Severity of abnormal movements (highest score from questions above): None, normal Incapacitation due to abnormal movements: None, normal Patient's awareness of abnormal movements (rate only patient's report): No Awareness, Dental Status Current problems with teeth and/or dentures?: No Does patient usually wear dentures?: No  CIWA:   CIWA-Ar Total: 1 COWS:  COWS Total Score: 1  Musculoskeletal: Strength & Muscle Tone: within normal limits Gait & Station: normal Patient leans: N/A  Psychiatric Specialty Exam: SEE SRA BY MD  Physical Exam  Nursing note and vitals reviewed. Constitutional: She is oriented to person, place, and time.  Neurological: She is alert and oriented to person, place, and time.    Review of Systems  Psychiatric/Behavioral: Negative for hallucinations, memory loss, substance abuse (hx of substance abuse ) and suicidal ideas. Depression: improved. Nervous/anxious: improved. Insomnia: improved.   All other systems reviewed and are negative.   Blood pressure 103/84, pulse 98, temperature 97.8 F (36.6 C), resp. rate 16, height 5\' 8"  (1.727 m), weight 71.2 kg (157 lb), SpO2 (!) 84 %.Body mass index is 23.87 kg/m.    Have you used any form of tobacco in the last 30 days? (Cigarettes, Smokeless Tobacco, Cigars, and/or Pipes): No  Has this patient used any form of tobacco in the last 30 days? (Cigarettes, Smokeless Tobacco, Cigars, and/or Pipes)  N/A  Blood Alcohol level:  Lab Results  Component Value Date   ETH <10 11/13/2017   ETH 179 (H) 62/07/5595    Metabolic Disorder Labs:  Lab Results  Component Value Date   HGBA1C 5.4 11/22/2017   MPG 108.28 11/22/2017   MPG 99.67 11/14/2017   No results found for: PROLACTIN Lab Results  Component Value Date   CHOL 287 (H) 11/22/2017   TRIG 198 (H) 11/22/2017   HDL 49 11/22/2017   CHOLHDL 5.9 11/22/2017   VLDL 40 11/22/2017   LDLCALC 198 (H) 11/22/2017   LDLCALC 106 (H) 11/14/2017    See Psychiatric Specialty Exam and Suicide Risk Assessment completed by Attending Physician prior to discharge.  Discharge destination:  Home  Is patient on multiple antipsychotic therapies at discharge:  No   Has Patient had three or more failed trials of antipsychotic monotherapy by history:  No  Recommended Plan for Multiple Antipsychotic  Therapies: NA   Allergies as of 11/28/2017      Reactions   Sulfa Antibiotics Rash      Medication List    STOP taking these medications   trazodone 300 MG tablet Commonly known as:  DESYREL   venlafaxine XR 37.5 MG 24 hr capsule Commonly known as:  EFFEXOR-XR     TAKE these medications     Indication  divalproex 500 MG 24 hr tablet Commonly known as:  DEPAKOTE ER Take 3 tablets (1,500 mg total) by mouth daily. Start taking on:  11/29/2017 What changed:  how much to take  Indication:  mood disorder   gabapentin 100 MG capsule Commonly known as:  NEURONTIN Take 1 capsule (100 mg total) by mouth 3 (three) times daily.  Indication:  anxeity   potassium chloride SA 20 MEQ tablet Commonly known as:  K-DUR,KLOR-CON Take 2 tablets (40 mEq total) by mouth daily.  Indication:  Low Amount of Potassium in the Blood   QUEtiapine 25 MG tablet Commonly known as:  SEROQUEL Take 1 tablet (25 mg total) by mouth 2 (two) times daily.  Indication:  mood disorder   QUEtiapine 50 MG tablet Commonly known as:  SEROQUEL Take 5 tablets (250 mg total) by mouth at bedtime.  Indication:  mood disorder      Follow-up Information    [facility], [facility]. Go on 11/29/2017.   Why:  Appointment for medication management and therapy services is Thursday, 11/29/17 at 8:30 am. Please be sure to bring your Photo ID, SSN, insurance/household income information and any discharge paperwork from this hospitalization.  Contact information: [address]  [id number] [phone]           Follow-up recommendations:  Follow up with your outpatient provided for any medical issues. Activity & diet as recommended by your primary care provider.  Comments:  Patient is instructed prior to discharge to: Take all medications as prescribed by his/her mental healthcare provider. Report any adverse effects and or reactions from the medicines to his/her outpatient provider promptly. Patient has  been instructed & cautioned: To not engage in alcohol and or illegal drug use while on prescription medicines. In the event of worsening symptoms, patient is instructed to call the crisis hotline, 911 and or go to the nearest ED for appropriate evaluation and treatment of symptoms. To follow-up with his/her primary care provider for your other medical issues, concerns and or health care needs.  Signed: Mordecai Maes, NP 11/28/2017, 10:06 AM

## 2017-11-28 NOTE — Progress Notes (Signed)
Discharge note: Patient discharged home per MD order.  She received all personal belongings from locker and unit.  Reviewed AVS/transition record with patient and she indicated understanding.  She denies any thoughts of self harm.  Patient received prescriptions and samples of her medications.  She left ambulatory with her sister.

## 2017-11-28 NOTE — Progress Notes (Signed)
  [facility] Adult Case Management Discharge Plan :  Will you be returning to the same living situation after discharge:  Yes,  own home At discharge, do you have transportation home?: Yes,  sister Do you have the ability to pay for your medications: No. Will work with [employer] of information consent forms completed and in the chart;  Patient's signature needed at discharge.  Patient to Follow up at: Follow-up Information    [facility]. Go on 11/29/2017.   Why:  Appointment for medication management and therapy services is Thursday, 11/29/17 at 8:30 am. Please be sure to bring your Photo ID, SSN, insurance/household income information and any discharge paperwork from this hospitalization.  Contact information: [address] [place] [address] [phone]           Next level of care provider has access to [place] and Suicide Prevention discussed: Yes,  with sister  Have you used any form of tobacco in the last 30 days? (Cigarettes, Smokeless Tobacco, Cigars, and/or Pipes): No  Has patient been referred to the Quitline?: N/A patient is not a smoker  Patient has been referred for addiction treatment: N/A  Joanne Chars, LCSW 11/28/2017, 12:58 PM

## 2017-11-28 NOTE — [facility] Group Notes (Signed)
[facility] Mental Health Association Group Therapy 11/28/2017 1:15pm  Type of Therapy: Mental Health Association Presentation  Participation Level: Active  Participation Quality: Attentive  Affect: Appropriate  Cognitive: Oriented  Insight: Developing/Improving  Engagement in Therapy: Engaged  Modes of Intervention: Discussion, Education and Socialization  Summary of Progress/Problems: [place] ([place]) Speaker came to talk about his personal journey with mental health. The pt processed ways by which to relate to the speaker. [place] speaker provided handouts and educational information pertaining to groups and services offered by the [facility]. Pt was engaged in speaker's presentation and was receptive to resources provided.    Joanne Chars, LCSW 11/28/2017 12:50 PM

## 2017-11-28 NOTE — Plan of Care (Signed)
  Problem: Education: Goal: Emotional status will improve Outcome: Completed/Met Goal: Mental status will improve Outcome: Completed/Met   Problem: Activity: Goal: Interest or engagement in activities will improve Outcome: Completed/Met Goal: Sleeping patterns will improve Outcome: Completed/Met   Problem: Coping: Goal: Ability to verbalize frustrations and anger appropriately will improve Outcome: Completed/Met Goal: Ability to demonstrate self-control will improve Outcome: Completed/Met   Problem: Health Behavior/Discharge Planning: Goal: Identification of resources available to assist in meeting health care needs will improve Outcome: Completed/Met Goal: Compliance with treatment plan for underlying cause of condition will improve Outcome: Completed/Met   Problem: Physical Regulation: Goal: Ability to maintain clinical measurements within normal limits will improve Outcome: Completed/Met   Problem: Safety: Goal: Periods of time without injury will increase Outcome: Completed/Met   Problem: Education: Goal: Utilization of techniques to improve thought processes will improve Outcome: Completed/Met Goal: Knowledge of the prescribed therapeutic regimen will improve Outcome: Completed/Met   Problem: Activity: Goal: Interest or engagement in leisure activities will improve Outcome: Completed/Met Goal: Imbalance in normal sleep/wake cycle will improve Outcome: Completed/Met   Problem: Coping: Goal: Coping ability will improve Outcome: Completed/Met Goal: Will verbalize feelings Outcome: Completed/Met   Problem: Health Behavior/Discharge Planning: Goal: Ability to make decisions will improve Outcome: Completed/Met Goal: Compliance with therapeutic regimen will improve Outcome: Completed/Met   Problem: Role Relationship: Goal: Will demonstrate positive changes in social behaviors and relationships Outcome: Completed/Met   Problem: Safety: Goal: Ability to  disclose and discuss suicidal ideas will improve Outcome: Completed/Met Goal: Ability to identify and utilize support systems that promote safety will improve Outcome: Completed/Met   Problem: Self-Concept: Goal: Will verbalize positive feelings about self Outcome: Completed/Met Goal: Level of anxiety will decrease Outcome: Completed/Met   Problem: Education: Goal: Ability to make informed decisions regarding treatment will improve Outcome: Completed/Met   Problem: Coping: Goal: Coping ability will improve Outcome: Completed/Met   Problem: Health Behavior/Discharge Planning: Goal: Identification of resources available to assist in meeting health care needs will improve Outcome: Completed/Met   Problem: Medication: Goal: Compliance with prescribed medication regimen will improve Outcome: Completed/Met   Problem: Self-Concept: Goal: Ability to disclose and discuss suicidal ideas will improve Outcome: Completed/Met Goal: Will verbalize positive feelings about self Outcome: Completed/Met

## 2018-02-12 ENCOUNTER — Ambulatory Visit (HOSPITAL_COMMUNITY)
Admission: RE | Admit: 2018-02-12 | Discharge: 2018-02-12 | Disposition: A | Discharge status: Home / Self Care | Payer: Self-pay | Source: Ambulatory Visit | Attending: Physician Assistant | Admitting: Physician Assistant

## 2018-02-12 ENCOUNTER — Encounter: Payer: Self-pay | Admitting: Physician Assistant

## 2018-02-12 ENCOUNTER — Ambulatory Visit: Payer: Self-pay | Admitting: Physician Assistant

## 2018-02-12 ENCOUNTER — Other Ambulatory Visit (HOSPITAL_COMMUNITY)
Admission: RE | Admit: 2018-02-12 | Discharge: 2018-02-12 | Disposition: A | Discharge status: Home / Self Care | Payer: Self-pay | Source: Ambulatory Visit | Attending: Physician Assistant | Admitting: Physician Assistant

## 2018-02-12 VITALS — BP 111/63 | HR 92 | Temp 98.1°F | Ht 64.75 in | Wt 177.0 lb

## 2018-02-12 DIAGNOSIS — F39 Unspecified mood [affective] disorder: Secondary | ICD-10-CM

## 2018-02-12 DIAGNOSIS — G8929 Other chronic pain: Secondary | ICD-10-CM | POA: Insufficient documentation

## 2018-02-12 DIAGNOSIS — M25562 Pain in left knee: Secondary | ICD-10-CM

## 2018-02-12 DIAGNOSIS — Z1211 Encounter for screening for malignant neoplasm of colon: Secondary | ICD-10-CM

## 2018-02-12 DIAGNOSIS — E785 Hyperlipidemia, unspecified: Secondary | ICD-10-CM

## 2018-02-12 DIAGNOSIS — Z7689 Persons encountering health services in other specified circumstances: Secondary | ICD-10-CM

## 2018-02-12 DIAGNOSIS — F17200 Nicotine dependence, unspecified, uncomplicated: Secondary | ICD-10-CM

## 2018-02-12 DIAGNOSIS — Z85118 Personal history of other malignant neoplasm of bronchus and lung: Secondary | ICD-10-CM

## 2018-02-12 DIAGNOSIS — Z1239 Encounter for other screening for malignant neoplasm of breast: Secondary | ICD-10-CM

## 2018-02-12 DIAGNOSIS — R0609 Other forms of dyspnea: Secondary | ICD-10-CM

## 2018-02-12 DIAGNOSIS — M1712 Unilateral primary osteoarthritis, left knee: Secondary | ICD-10-CM | POA: Insufficient documentation

## 2018-02-12 LAB — COMPREHENSIVE METABOLIC PANEL
ALT: 18 U/L (ref 0–44)
ANION GAP: 8 (ref 5–15)
AST: 12 U/L — ABNORMAL LOW (ref 15–41)
Albumin: 3.7 g/dL (ref 3.5–5.0)
Alkaline Phosphatase: 76 U/L (ref 38–126)
BUN: 13 mg/dL (ref 6–20)
CHLORIDE: 103 mmol/L (ref 98–111)
CO2: 28 mmol/L (ref 22–32)
Calcium: 9.2 mg/dL (ref 8.9–10.3)
Creatinine, Ser: 0.69 mg/dL (ref 0.44–1.00)
Glucose, Bld: 107 mg/dL — ABNORMAL HIGH (ref 70–99)
Potassium: 4.3 mmol/L (ref 3.5–5.1)
Sodium: 139 mmol/L (ref 135–145)
Total Bilirubin: 0.5 mg/dL (ref 0.3–1.2)
Total Protein: 7.1 g/dL (ref 6.5–8.1)

## 2018-02-12 LAB — LIPID PANEL
CHOL/HDL RATIO: 5 ratio
CHOLESTEROL: 230 mg/dL — AB (ref 0–200)
HDL: 46 mg/dL (ref >40)
LDL Cholesterol: 144 mg/dL — ABNORMAL HIGH (ref 0–99)
TRIGLYCERIDES: 202 mg/dL — AB (ref <150)
VLDL: 40 mg/dL (ref 0–40)

## 2018-02-12 MED ORDER — ALBUTEROL SULFATE HFA 108 (90 BASE) MCG/ACT IN AERS: 2.0000 | INHALATION_SPRAY | Freq: Four times a day (QID) | RESPIRATORY_TRACT | 0 refills | Status: DC | PRN

## 2018-02-12 NOTE — Progress Notes (Signed)
BP 111/63 (BP Location: Right Arm, Patient Position: Sitting, Cuff Size: Normal)   Pulse 92   Temp 98.1 F (36.7 C)   Ht 5' 4.75" (1.645 m)   Wt 177 lb (80.3 kg)   SpO2 98%   BMI 29.68 kg/m    Subjective:    Patient ID: [name], female    DOB: 1956-02-04, 62 y.o.   MRN: [id number]  HPI: [name] is a 62 y.o. female presenting on 02/12/2018 for New Patient (Initial Visit) (pt got blood work done on Thursday at [facility] when she went to the ER for UTI. current pt with Dr Hoyle Barr at [facility]. pt has f/u with him in Nov.. previous PCP was Dr. Luciana Axe at [facility] in [place])   HPI   Pt is currently going to [facility] for [place].  She was inpatient in July with depression and SI  Pt has hx lung cancer with 2/3 R lung removed in 2011 previous PCP [facility] in [place]. Other PMH includes hx eagles syndrome with surgical treatment and Hx drug and alcohol abuse  Lipids high in July but ok 3 months ago  Pt states worst complaint is LLE pain.  The knee is the worst part.  There is xray in chart from 2016 and order for MRI that wasn't ever done.  Pt says she is done with oncologist and was told she only needed to follow up there if she has a problem.  Pt says she gets DOE.  She has used inhalers in the past but doesn't have one now.    Relevant past medical, surgical, family and social history reviewed and updated as indicated. Interim medical history since our last visit reviewed. Allergies and medications reviewed and updated.    Current Outpatient Medications:  .  citalopram (CELEXA) 20 MG tablet, Take 20 mg by mouth daily., Disp: , Rfl:  .  naproxen (NAPROSYN) 500 MG tablet, Take 500 mg by mouth 2 (two) times daily as needed for mild pain., Disp: , Rfl:  .  nitrofurantoin (MACRODANTIN) 100 MG capsule, Take 100 mg by mouth 2 (two) times daily., Disp: , Rfl:  .  traZODone (DESYREL) 100 MG tablet, Take 100 mg by mouth at bedtime., Disp: , Rfl:    Review of Systems  Constitutional:  Positive for appetite change, fatigue and unexpected weight change. Negative for chills, diaphoresis and fever.  HENT: Positive for congestion, dental problem and sore throat. Negative for drooling, ear pain, facial swelling, hearing loss, mouth sores, sneezing, trouble swallowing and voice change.   Eyes: Negative for pain, discharge, redness, itching and visual disturbance.  Respiratory: Positive for cough and choking. Negative for shortness of breath and wheezing.   Cardiovascular: Negative for chest pain, palpitations and leg swelling.  Gastrointestinal: Positive for diarrhea. Negative for abdominal pain, blood in stool, constipation and vomiting.  Endocrine: Negative for cold intolerance, heat intolerance and polydipsia.  Genitourinary: Positive for dysuria. Negative for decreased urine volume and hematuria.  Musculoskeletal: Positive for arthralgias and back pain. Negative for gait problem.  Skin: Negative for rash.  Allergic/Immunologic: Negative for environmental allergies.  Neurological: Negative for seizures, syncope, light-headedness and headaches.  Hematological: Negative for adenopathy.  Psychiatric/Behavioral: Positive for dysphoric mood. Negative for agitation and suicidal ideas. The patient is nervous/anxious.     Per HPI unless specifically indicated above     Objective:    BP 111/63 (BP Location: Right Arm, Patient Position: Sitting, Cuff Size: Normal)   Pulse 92   Temp  98.1 F (36.7 C)   Ht 5' 4.75" (1.645 m)   Wt 177 lb (80.3 kg)   SpO2 98%   BMI 29.68 kg/m   Wt Readings from Last 3 Encounters:  02/12/18 177 lb (80.3 kg)  11/14/17 157 lb (71.2 kg)  11/13/17 170 lb (77.1 kg)    Physical Exam  Constitutional: She is oriented to person, place, and time. She appears well-developed and well-nourished.  HENT:  Head: Normocephalic and atraumatic.  Right Ear: A foreign body is present.  Left Ear: Tympanic membrane normal.  Mouth/Throat: Uvula is midline and  oropharynx is clear and moist. No oropharyngeal exudate.  Cerumen R ear  Eyes: Pupils are equal, round, and reactive to light. Conjunctivae and EOM are normal.  Neck: Neck supple. No thyromegaly present.  Cardiovascular: Normal rate and regular rhythm.  Pulmonary/Chest: Effort normal and breath sounds normal.  Abdominal: Soft. Bowel sounds are normal. She exhibits no mass. There is no hepatosplenomegaly. There is no tenderness.  Musculoskeletal: She exhibits no edema.       Left knee: She exhibits swelling.  Mild swelling R knee.  Pain with ROM testing but non-tender  Lymphadenopathy:    She has no cervical adenopathy.  Neurological: She is alert and oriented to person, place, and time. Gait normal.  Skin: Skin is warm and dry.  Psychiatric: She has a normal mood and affect. Her speech is normal and behavior is normal. Cognition and memory are normal.  Very pleasant and engaging  Vitals reviewed.       Assessment & Plan:   Encounter Diagnoses  Name Primary?  . Encounter to establish care Yes  . Hyperlipidemia, unspecified hyperlipidemia type   . Screening for breast cancer   . Screening for colon cancer   . Chronic pain of left knee   . DOE (dyspnea on exertion)   . History of lung cancer   . Mood disorder ([place])   . Tobacco use disorder     -will Recheck lipids -ordered screening Mammogram -pt was given iFOBT for colon cancer screening -will Xray L knee -pt was given [facility] care application -pt was signed up for [facility] -inhaler ordered from [facility] to help with DOE -pt to continue with [facility] for [place] issues -pt to follow up 3 weeks.  RTO sooner prn

## 2018-02-15 ENCOUNTER — Telehealth (HOSPITAL_COMMUNITY): Payer: Self-pay

## 2018-02-15 NOTE — Telephone Encounter (Signed)
Called left a message to call [facility]

## 2018-02-19 ENCOUNTER — Other Ambulatory Visit: Payer: Self-pay | Admitting: Physician Assistant

## 2018-02-25 ENCOUNTER — Other Ambulatory Visit: Payer: Self-pay | Admitting: Physician Assistant

## 2018-02-25 DIAGNOSIS — Z1211 Encounter for screening for malignant neoplasm of colon: Secondary | ICD-10-CM

## 2018-02-25 LAB — IFOBT (OCCULT BLOOD): IFOBT: NEGATIVE

## 2018-03-01 ENCOUNTER — Other Ambulatory Visit (HOSPITAL_COMMUNITY): Payer: Self-pay | Admitting: *Deleted

## 2018-03-01 DIAGNOSIS — Z1231 Encounter for screening mammogram for malignant neoplasm of breast: Secondary | ICD-10-CM

## 2018-03-05 ENCOUNTER — Encounter: Payer: Self-pay | Admitting: Physician Assistant

## 2018-03-05 ENCOUNTER — Ambulatory Visit: Payer: Self-pay | Admitting: Physician Assistant

## 2018-03-05 VITALS — BP 100/74 | HR 65 | Temp 96.7°F | Ht 64.75 in | Wt 187.2 lb

## 2018-03-05 DIAGNOSIS — G8929 Other chronic pain: Secondary | ICD-10-CM

## 2018-03-05 DIAGNOSIS — J441 Chronic obstructive pulmonary disease with (acute) exacerbation: Secondary | ICD-10-CM

## 2018-03-05 DIAGNOSIS — M25562 Pain in left knee: Principal | ICD-10-CM

## 2018-03-05 DIAGNOSIS — F17200 Nicotine dependence, unspecified, uncomplicated: Secondary | ICD-10-CM

## 2018-03-05 DIAGNOSIS — E785 Hyperlipidemia, unspecified: Secondary | ICD-10-CM

## 2018-03-05 DIAGNOSIS — R062 Wheezing: Secondary | ICD-10-CM

## 2018-03-05 MED ORDER — ATORVASTATIN CALCIUM 20 MG PO TABS: 20.0000 mg | ORAL_TABLET | Freq: Every day | ORAL | 1 refills | Status: DC

## 2018-03-05 MED ORDER — ALBUTEROL SULFATE (2.5 MG/3ML) 0.083% IN NEBU
2.5000 mg | INHALATION_SOLUTION | Freq: Once | RESPIRATORY_TRACT | Status: AC
  Administered 2018-03-05: 2.5 mg via RESPIRATORY_TRACT

## 2018-03-05 MED ORDER — ALBUTEROL SULFATE HFA 108 (90 BASE) MCG/ACT IN AERS: 2.0000 | INHALATION_SPRAY | Freq: Four times a day (QID) | RESPIRATORY_TRACT | 1 refills | Status: DC | PRN

## 2018-03-05 MED ORDER — PREDNISONE 20 MG PO TABS: 40.0000 mg | ORAL_TABLET | Freq: Every day | ORAL | 0 refills | Status: DC

## 2018-03-05 NOTE — Patient Instructions (Addendum)
Cholesterol Cholesterol is a white, waxy, fat-like substance that is needed by the human body in small amounts. The liver makes all the cholesterol we need. Cholesterol is carried from the liver by the blood through the blood vessels. Deposits of cholesterol (plaques) may build up on blood vessel (artery) walls. Plaques make the arteries narrower and stiffer. Cholesterol plaques increase the risk for heart attack and stroke. You cannot feel your cholesterol level even if it is very high. The only way to know that it is high is to have a blood test. Once you know your cholesterol levels, you should keep a record of the test results. Work with your health care provider to keep your levels in the desired range. What do the results mean?  Total cholesterol is a rough measure of all the cholesterol in your blood.  LDL (low-density lipoprotein) is the "bad" cholesterol. This is the type that causes plaque to build up on the artery walls. You want this level to be low.  HDL (high-density lipoprotein) is the "good" cholesterol because it cleans the arteries and carries the LDL away. You want this level to be high.  Triglycerides are fat that the body can either burn for energy or store. High levels are closely linked to heart disease. What are the desired levels of cholesterol?  Total cholesterol below 200.  LDL below 100 for people who are at risk, below 70 for people at very high risk.  HDL above 40 is good. A level of 60 or higher is considered to be protective against heart disease.  Triglycerides below 150. How can I lower my cholesterol? Diet Follow your diet program as told by your health care provider.  Choose fish or white meat chicken and [place], roasted or baked. Limit fatty cuts of red meat, fried foods, and processed meats, such as sausage and lunch meats.  Eat lots of fresh fruits and vegetables.  Choose whole grains, beans, pasta, potatoes, and cereals.  Choose olive oil, corn  oil, or canola oil, and use only small amounts.  Avoid butter, mayonnaise, shortening, or palm kernel oils.  Avoid foods with trans fats.  Drink skim or nonfat milk and eat low-fat or nonfat yogurt and cheeses. Avoid whole milk, cream, ice cream, egg yolks, and full-fat cheeses.  Healthier desserts include angel food cake, ginger snaps, animal crackers, hard candy, popsicles, and low-fat or nonfat frozen yogurt. Avoid pastries, cakes, pies, and cookies.  Exercise  Follow your exercise program as told by your health care provider. A regular program: ? Helps to decrease LDL and raise HDL. ? Helps with weight control.  Do things that increase your activity level, such as gardening, walking, and taking the stairs.  Ask your health care provider about ways that you can be more active in your daily life.  Medicine  Take over-the-counter and prescription medicines only as told by your health care provider. ? Medicine may be prescribed by your health care provider to help lower cholesterol and decrease the risk for heart disease. This is usually done if diet and exercise have failed to bring down cholesterol levels. ? If you have several risk factors, you may need medicine even if your levels are normal.  This information is not intended to replace advice given to you by your health care provider. Make sure you discuss any questions you have with your health care provider. Document Released: 01/24/2001 Document Revised: 11/27/2015 Document Reviewed: 10/30/2015 Elsevier Interactive Patient Education  [employer].   ---------------------------------------------------------------------------------------------  Steps to Quit Smoking Smoking tobacco can be harmful to your health and can affect almost every organ in your body. Smoking puts you, and those around you, at risk for developing many serious chronic diseases. Quitting smoking is difficult, but it is one of the best things that  you can do for your health. It is never too late to quit. What are the benefits of quitting smoking? When you quit smoking, you lower your risk of developing serious diseases and conditions, such as:  Lung cancer or lung disease, such as COPD.  Heart disease.  Stroke.  Heart attack.  Infertility.  Osteoporosis and bone fractures.  Additionally, symptoms such as coughing, wheezing, and shortness of breath may get better when you quit. You may also find that you get sick less often because your body is stronger at fighting off colds and infections. If you are pregnant, quitting smoking can help to reduce your chances of having a baby of low birth weight. How do I get ready to quit? When you decide to quit smoking, create a plan to make sure that you are successful. Before you quit:  Pick a date to quit. Set a date within the next two weeks to give you time to prepare.  Write down the reasons why you are quitting. Keep this list in places where you will see it often, such as on your bathroom mirror or in your car or wallet.  Identify the people, places, things, and activities that make you want to smoke (triggers) and avoid them. Make sure to take these actions: ? Throw away all cigarettes at home, at work, and in your car. ? Throw away smoking accessories, such as Scientist, research (medical). ? Clean your car and make sure to empty the ashtray. ? Clean your home, including curtains and carpets.  Tell your family, friends, and coworkers that you are quitting. Support from your loved ones can make quitting easier.  Talk with your health care provider about your options for quitting smoking.  Find out what treatment options are covered by your health insurance.  What strategies can I use to quit smoking? Talk with your healthcare provider about different strategies to quit smoking. Some strategies include:  Quitting smoking altogether instead of gradually lessening how much you smoke over  a period of time. Research shows that quitting "cold [place]" is more successful than gradually quitting.  Attending in-person counseling to help you build problem-solving skills. You are more likely to have success in quitting if you attend several counseling sessions. Even short sessions of 10 minutes can be effective.  Finding resources and support systems that can help you to quit smoking and remain smoke-free after you quit. These resources are most helpful when you use them often. They can include: ? Online chats with a Social worker. ? Telephone quitlines. ? Careers information officer. ? Support groups or group counseling. ? Text messaging programs. ? Mobile phone applications.  Taking medicines to help you quit smoking. (If you are pregnant or breastfeeding, talk with your health care provider first.) Some medicines contain nicotine and some do not. Both types of medicines help with cravings, but the medicines that include nicotine help to relieve withdrawal symptoms. Your health care provider may recommend: ? Nicotine patches, gum, or lozenges. ? Nicotine inhalers or sprays. ? Non-nicotine medicine that is taken by mouth.  Talk with your health care provider about combining strategies, such as taking medicines while you are also receiving in-person counseling. Using these two strategies  together makes you more likely to succeed in quitting than if you used either strategy on its own. If you are pregnant or breastfeeding, talk with your health care provider about finding counseling or other support strategies to quit smoking. Do not take medicine to help you quit smoking unless told to do so by your health care provider. What things can I do to make it easier to quit? Quitting smoking might feel overwhelming at first, but there is a lot that you can do to make it easier. Take these important actions:  Reach out to your family and friends and ask that they support and encourage you during  this time. Call telephone quitlines, reach out to support groups, or work with a counselor for support.  Ask people who smoke to avoid smoking around you.  Avoid places that trigger you to smoke, such as bars, parties, or smoke-break areas at work.  Spend time around people who do not smoke.  Lessen stress in your life, because stress can be a smoking trigger for some people. To lessen stress, try: ? Exercising regularly. ? Deep-breathing exercises. ? Yoga. ? Meditating. ? Performing a body scan. This involves closing your eyes, scanning your body from head to toe, and noticing which parts of your body are particularly tense. Purposefully relax the muscles in those areas.  Download or purchase mobile phone or tablet apps (applications) that can help you stick to your quit plan by providing reminders, tips, and encouragement. There are many free apps, such as QuitGuide from the [employer] Office manager for Disease Control and Prevention). You can find other support for quitting smoking (smoking cessation) through smokefree.gov and other websites.  How will I feel when I quit smoking? Within the first 24 hours of quitting smoking, you may start to feel some withdrawal symptoms. These symptoms are usually most noticeable 2-3 days after quitting, but they usually do not last beyond 2-3 weeks. Changes or symptoms that you might experience include:  Mood swings.  Restlessness, anxiety, or irritation.  Difficulty concentrating.  Dizziness.  Strong cravings for sugary foods in addition to nicotine.  Mild weight gain.  Constipation.  Nausea.  Coughing or a sore throat.  Changes in how your medicines work in your body.  A depressed mood.  Difficulty sleeping (insomnia).  After the first 2-3 weeks of quitting, you may start to notice more positive results, such as:  Improved sense of smell and taste.  Decreased coughing and sore throat.  Slower heart rate.  Lower blood  pressure.  Clearer skin.  The ability to breathe more easily.  Fewer sick days.  Quitting smoking is very challenging for most people. Do not get discouraged if you are not successful the first time. Some people need to make many attempts to quit before they achieve long-term success. Do your best to stick to your quit plan, and talk with your health care provider if you have any questions or concerns. This information is not intended to replace advice given to you by your health care provider. Make sure you discuss any questions you have with your health care provider. Document Released: 04/25/2001 Document Revised: 12/28/2015 Document Reviewed: 09/15/2014 Elsevier Interactive Patient Education  [employer].

## 2018-03-05 NOTE — Progress Notes (Signed)
BP 100/74   Pulse 65   Temp (!) 96.7 F (35.9 C) (Oral)   Ht 5' 4.75" (1.645 m)   Wt 187 lb 4 oz (84.9 kg)   SpO2 99%   BMI 31.40 kg/m    Subjective:    Patient ID: [name], female    DOB: July 30, 1955, 62 y.o.   MRN: [id number]  HPI: [name] is a 62 y.o. female presenting on 03/05/2018 for Follow-up   HPI   Pt got her inhaler.  She says somehing in her purse put on it and made it run so it's almost out.   She is still smoking.   Pt still going to [facility].  She has appointment for mammogram in January  Pt did not turn in [facility] application   Relevant past medical, surgical, family and social history reviewed and updated as indicated. Interim medical history since our last visit reviewed. Allergies and medications reviewed and updated.   Current Outpatient Medications:  .  albuterol (PROVENTIL HFA;VENTOLIN HFA) 108 (90 Base) MCG/ACT inhaler, Inhale 2 puffs into the lungs every 6 (six) hours as needed for wheezing or shortness of breath., Disp: 1 Inhaler, Rfl: 0 .  citalopram (CELEXA) 20 MG tablet, Take 20 mg by mouth daily., Disp: , Rfl:  .  naproxen (NAPROSYN) 500 MG tablet, Take 500 mg by mouth 2 (two) times daily as needed for mild pain., Disp: , Rfl:  .  traZODone (DESYREL) 100 MG tablet, Take 100 mg by mouth at bedtime., Disp: , Rfl:    Review of Systems  Constitutional: Positive for fatigue. Negative for appetite change, chills, diaphoresis, fever and unexpected weight change.  HENT: Positive for congestion, dental problem, sneezing and sore throat. Negative for drooling, ear pain, facial swelling, hearing loss, mouth sores, trouble swallowing and voice change.   Eyes: Negative for pain, discharge, redness, itching and visual disturbance.  Respiratory: Positive for cough. Negative for choking, shortness of breath and wheezing.   Cardiovascular: Negative for chest pain, palpitations and leg swelling.  Gastrointestinal: Negative for abdominal  pain, blood in stool, constipation, diarrhea and vomiting.  Endocrine: Negative for cold intolerance, heat intolerance and polydipsia.  Genitourinary: Negative for decreased urine volume, dysuria and hematuria.  Musculoskeletal: Negative for arthralgias, back pain and gait problem.  Skin: Negative for rash.  Allergic/Immunologic: Negative for environmental allergies.  Neurological: Negative for seizures, syncope, light-headedness and headaches.  Hematological: Negative for adenopathy.  Psychiatric/Behavioral: Negative for agitation, dysphoric mood and suicidal ideas. The patient is not nervous/anxious.     Per HPI unless specifically indicated above     Objective:    BP 100/74   Pulse 65   Temp (!) 96.7 F (35.9 C) (Oral)   Ht 5' 4.75" (1.645 m)   Wt 187 lb 4 oz (84.9 kg)   SpO2 99%   BMI 31.40 kg/m   Wt Readings from Last 3 Encounters:  03/05/18 187 lb 4 oz (84.9 kg)  02/12/18 177 lb (80.3 kg)  11/13/17 170 lb (77.1 kg)    Physical Exam  Constitutional: She is oriented to person, place, and time. She appears well-developed and well-nourished.  HENT:  Head: Normocephalic and atraumatic.  Neck: Neck supple.  Cardiovascular: Normal rate and regular rhythm.  Pulmonary/Chest: Effort normal. No respiratory distress. She has wheezes. She has no rales.  Abdominal: Soft. Bowel sounds are normal. She exhibits no mass. There is no hepatosplenomegaly. There is no tenderness.  Musculoskeletal: She exhibits no edema.  Lymphadenopathy:  She has no cervical adenopathy.  Neurological: She is alert and oriented to person, place, and time.  Skin: Skin is warm and dry.  Psychiatric: She has a normal mood and affect. Her behavior is normal.  Vitals reviewed.   Results for orders placed or performed in visit on 02/25/18  IFOBT POC (occult bld, rslt in office)  Result Value Ref Range   IFOBT Negative       Assessment & Plan:   Encounter Diagnoses  Name Primary?  . Chronic pain of  left knee Yes  . Wheezing   . Hyperlipidemia, unspecified hyperlipidemia type   . COPD with acute exacerbation ([place])   . Tobacco use disorder     -pt given nebulizer treatment in office which provided improvement to wheezing -reviewed labs and knee xray with pt  -pt is put on Dental list -rx atorvastatin and lowfat diet -will Refer to ortho for L knee -pt was [facility] -rx Prednisone for copd exacerbation.  She has albuterol to use as needed -counseled smoking cessation -pt has mammogram in january as scheduled -pt to follow up 3 months.  RTO sooner prn

## 2018-04-09 ENCOUNTER — Other Ambulatory Visit: Payer: Self-pay | Admitting: Physician Assistant

## 2018-04-09 DIAGNOSIS — E785 Hyperlipidemia, unspecified: Secondary | ICD-10-CM

## 2018-05-21 ENCOUNTER — Ambulatory Visit (HOSPITAL_COMMUNITY): Payer: Self-pay

## 2018-06-05 ENCOUNTER — Ambulatory Visit: Payer: Self-pay | Admitting: Physician Assistant

## 2018-06-12 ENCOUNTER — Encounter: Payer: Self-pay | Admitting: Physician Assistant

## 2018-06-13 ENCOUNTER — Encounter: Payer: Self-pay | Admitting: Orthopaedic Surgery

## 2018-08-24 DIAGNOSIS — F315 Bipolar disorder, current episode depressed, severe, with psychotic features: Secondary | ICD-10-CM | POA: Insufficient documentation

## 2018-08-29 DIAGNOSIS — E559 Vitamin D deficiency, unspecified: Secondary | ICD-10-CM | POA: Insufficient documentation

## 2018-08-29 DIAGNOSIS — E538 Deficiency of other specified B group vitamins: Secondary | ICD-10-CM | POA: Insufficient documentation

## 2021-07-04 DIAGNOSIS — F3162 Bipolar disorder, current episode mixed, moderate: Secondary | ICD-10-CM | POA: Insufficient documentation

## 2021-07-04 DIAGNOSIS — F22 Delusional disorders: Secondary | ICD-10-CM | POA: Insufficient documentation

## 2021-07-27 ENCOUNTER — Encounter (HOSPITAL_COMMUNITY): Payer: Self-pay | Admitting: *Deleted

## 2021-07-27 ENCOUNTER — Emergency Department (HOSPITAL_COMMUNITY): Payer: Self-pay

## 2021-07-27 ENCOUNTER — Other Ambulatory Visit: Payer: Self-pay

## 2021-07-27 ENCOUNTER — Inpatient Hospital Stay (HOSPITAL_COMMUNITY)
Admission: EM | Admit: 2021-07-27 | Discharge: 2021-08-03 | DRG: 871 | Disposition: A | Discharge status: Skilled Nursing Facility | Payer: Self-pay | Attending: Internal Medicine | Admitting: Internal Medicine

## 2021-07-27 DIAGNOSIS — Z20822 Contact with and (suspected) exposure to covid-19: Secondary | ICD-10-CM | POA: Diagnosis present

## 2021-07-27 DIAGNOSIS — K219 Gastro-esophageal reflux disease without esophagitis: Secondary | ICD-10-CM | POA: Diagnosis present

## 2021-07-27 DIAGNOSIS — E876 Hypokalemia: Secondary | ICD-10-CM | POA: Diagnosis present

## 2021-07-27 DIAGNOSIS — E785 Hyperlipidemia, unspecified: Secondary | ICD-10-CM | POA: Diagnosis present

## 2021-07-27 DIAGNOSIS — F209 Schizophrenia, unspecified: Secondary | ICD-10-CM | POA: Diagnosis present

## 2021-07-27 DIAGNOSIS — Z85118 Personal history of other malignant neoplasm of bronchus and lung: Secondary | ICD-10-CM

## 2021-07-27 DIAGNOSIS — D75838 Other thrombocytosis: Secondary | ICD-10-CM | POA: Diagnosis present

## 2021-07-27 DIAGNOSIS — Z79899 Other long term (current) drug therapy: Secondary | ICD-10-CM

## 2021-07-27 DIAGNOSIS — R338 Other retention of urine: Secondary | ICD-10-CM | POA: Diagnosis present

## 2021-07-27 DIAGNOSIS — L89520 Pressure ulcer of left ankle, unstageable: Secondary | ICD-10-CM | POA: Diagnosis present

## 2021-07-27 DIAGNOSIS — Z803 Family history of malignant neoplasm of breast: Secondary | ICD-10-CM

## 2021-07-27 DIAGNOSIS — A4150 Gram-negative sepsis, unspecified: Principal | ICD-10-CM | POA: Diagnosis present

## 2021-07-27 DIAGNOSIS — R5381 Other malaise: Secondary | ICD-10-CM | POA: Diagnosis present

## 2021-07-27 DIAGNOSIS — Z902 Acquired absence of lung [part of]: Secondary | ICD-10-CM

## 2021-07-27 DIAGNOSIS — S91002A Unspecified open wound, left ankle, initial encounter: Secondary | ICD-10-CM | POA: Diagnosis present

## 2021-07-27 DIAGNOSIS — F17210 Nicotine dependence, cigarettes, uncomplicated: Secondary | ICD-10-CM | POA: Diagnosis present

## 2021-07-27 DIAGNOSIS — G9341 Metabolic encephalopathy: Secondary | ICD-10-CM | POA: Diagnosis present

## 2021-07-27 DIAGNOSIS — S91002S Unspecified open wound, left ankle, sequela: Secondary | ICD-10-CM

## 2021-07-27 DIAGNOSIS — I959 Hypotension, unspecified: Secondary | ICD-10-CM | POA: Diagnosis present

## 2021-07-27 DIAGNOSIS — Z833 Family history of diabetes mellitus: Secondary | ICD-10-CM

## 2021-07-27 DIAGNOSIS — R6520 Severe sepsis without septic shock: Secondary | ICD-10-CM | POA: Diagnosis present

## 2021-07-27 DIAGNOSIS — N390 Urinary tract infection, site not specified: Secondary | ICD-10-CM | POA: Diagnosis present

## 2021-07-27 DIAGNOSIS — R410 Disorientation, unspecified: Principal | ICD-10-CM

## 2021-07-27 DIAGNOSIS — E870 Hyperosmolality and hypernatremia: Secondary | ICD-10-CM | POA: Diagnosis present

## 2021-07-27 DIAGNOSIS — F314 Bipolar disorder, current episode depressed, severe, without psychotic features: Secondary | ICD-10-CM | POA: Diagnosis present

## 2021-07-27 DIAGNOSIS — A419 Sepsis, unspecified organism: Secondary | ICD-10-CM

## 2021-07-27 DIAGNOSIS — Z800 Family history of malignant neoplasm of digestive organs: Secondary | ICD-10-CM

## 2021-07-27 LAB — CBC WITH DIFFERENTIAL/PLATELET
Abs Immature Granulocytes: 0.06 10*3/uL (ref 0.00–0.07)
Basophils Absolute: 0 10*3/uL (ref 0.0–0.1)
Basophils Relative: 0 %
Eosinophils Absolute: 0 10*3/uL (ref 0.0–0.5)
Eosinophils Relative: 0 %
HCT: 41.2 % (ref 36.0–46.0)
Hemoglobin: 13.2 g/dL (ref 12.0–15.0)
Immature Granulocytes: 1 %
Lymphocytes Relative: 7 %
Lymphs Abs: 0.9 10*3/uL (ref 0.7–4.0)
MCH: 32.2 pg (ref 26.0–34.0)
MCHC: 32 g/dL (ref 30.0–36.0)
MCV: 100.5 fL — ABNORMAL HIGH (ref 80.0–100.0)
Monocytes Absolute: 0.9 10*3/uL (ref 0.1–1.0)
Monocytes Relative: 7 %
Neutro Abs: 10.9 10*3/uL — ABNORMAL HIGH (ref 1.7–7.7)
Neutrophils Relative %: 85 %
Platelets: 401 10*3/uL — ABNORMAL HIGH (ref 150–400)
RBC: 4.1 MIL/uL (ref 3.87–5.11)
RDW: 14.4 % (ref 11.5–15.5)
WBC: 12.9 10*3/uL — ABNORMAL HIGH (ref 4.0–10.5)
nRBC: 0 % (ref 0.0–0.2)

## 2021-07-27 LAB — RESP PANEL BY RT-PCR (FLU A&B, COVID) ARPGX2
Influenza A by PCR: NEGATIVE
Influenza B by PCR: NEGATIVE
SARS Coronavirus 2 by RT PCR: NEGATIVE

## 2021-07-27 LAB — URINALYSIS, ROUTINE W REFLEX MICROSCOPIC
Bilirubin Urine: NEGATIVE
Glucose, UA: NEGATIVE mg/dL
Ketones, ur: 5 mg/dL — AB
Nitrite: NEGATIVE
Protein, ur: 30 mg/dL — AB
Specific Gravity, Urine: 1.017 (ref 1.005–1.030)
pH: 7 (ref 5.0–8.0)

## 2021-07-27 LAB — COMPREHENSIVE METABOLIC PANEL
ALT: 18 U/L (ref 0–44)
AST: 17 U/L (ref 15–41)
Albumin: 3.4 g/dL — ABNORMAL LOW (ref 3.5–5.0)
Alkaline Phosphatase: 83 U/L (ref 38–126)
Anion gap: 13 (ref 5–15)
BUN: 13 mg/dL (ref 8–23)
CO2: 24 mmol/L (ref 22–32)
Calcium: 9.9 mg/dL (ref 8.9–10.3)
Chloride: 100 mmol/L (ref 98–111)
Creatinine, Ser: 0.59 mg/dL (ref 0.44–1.00)
GFR, Estimated: >60 mL/min (ref >60)
Glucose, Bld: 143 mg/dL — ABNORMAL HIGH (ref 70–99)
Potassium: 3.5 mmol/L (ref 3.5–5.1)
Sodium: 137 mmol/L (ref 135–145)
Total Bilirubin: 1.3 mg/dL — ABNORMAL HIGH (ref 0.3–1.2)
Total Protein: 6.8 g/dL (ref 6.5–8.1)

## 2021-07-27 LAB — RAPID URINE DRUG SCREEN, HOSP PERFORMED
Amphetamines: NOT DETECTED
Barbiturates: NOT DETECTED
Benzodiazepines: NOT DETECTED
Cocaine: NOT DETECTED
Opiates: NOT DETECTED
Tetrahydrocannabinol: NOT DETECTED

## 2021-07-27 LAB — LACTIC ACID, PLASMA
Lactic Acid, Venous: 2.7 mmol/L (ref 0.5–1.9)
Lactic Acid, Venous: 3.3 mmol/L (ref 0.5–1.9)

## 2021-07-27 LAB — ETHANOL: Alcohol, Ethyl (B): <10 mg/dL (ref <10)

## 2021-07-27 MED ORDER — HALOPERIDOL LACTATE 5 MG/ML IJ SOLN: 2.0000 mg | Freq: Four times a day (QID) | INTRAMUSCULAR | Status: DC | PRN

## 2021-07-27 MED ORDER — MAGNESIUM HYDROXIDE 400 MG/5ML PO SUSP: 30.0000 mL | Freq: Every day | ORAL | Status: DC | PRN

## 2021-07-27 MED ORDER — ACETAMINOPHEN 325 MG PO TABS
650.0000 mg | ORAL_TABLET | Freq: Four times a day (QID) | ORAL | Status: DC | PRN
  Administered 2021-07-29 – 2021-08-02 (×8): 650 mg via ORAL
  Filled 2021-07-27 (×8): qty 2

## 2021-07-27 MED ORDER — LORAZEPAM 2 MG/ML IJ SOLN
1.0000 mg | Freq: Once | INTRAMUSCULAR | Status: AC
  Administered 2021-07-27: 1 mg via INTRAVENOUS
  Filled 2021-07-27: qty 1

## 2021-07-27 MED ORDER — MIDODRINE HCL 5 MG PO TABS: 5.0000 mg | ORAL_TABLET | Freq: Three times a day (TID) | ORAL | Status: DC

## 2021-07-27 MED ORDER — LABETALOL HCL 5 MG/ML IV SOLN
20.0000 mg | INTRAVENOUS | Status: DC | PRN
Start: 2021-07-27 — End: 2021-07-29

## 2021-07-27 MED ORDER — VANCOMYCIN HCL IN DEXTROSE 1-5 GM/200ML-% IV SOLN: 1000.0000 mg | Freq: Once | INTRAVENOUS | Status: DC

## 2021-07-27 MED ORDER — HALOPERIDOL LACTATE 5 MG/ML IJ SOLN
5.0000 mg | Freq: Once | INTRAMUSCULAR | Status: AC
  Administered 2021-07-27: 5 mg via INTRAVENOUS
  Filled 2021-07-27: qty 1

## 2021-07-27 MED ORDER — VANCOMYCIN HCL 1500 MG/300ML IV SOLN
1500.0000 mg | Freq: Once | INTRAVENOUS | Status: DC
  Administered 2021-07-28: 1500 mg via INTRAVENOUS
  Filled 2021-07-27: qty 300

## 2021-07-27 MED ORDER — FOLIC ACID 1 MG PO TABS
1.0000 mg | ORAL_TABLET | Freq: Every day | ORAL | Status: DC
  Administered 2021-07-28 – 2021-08-03 (×7): 1 mg via ORAL
  Filled 2021-07-27 (×7): qty 1

## 2021-07-27 MED ORDER — LITHIUM CARBONATE 300 MG PO CAPS
600.0000 mg | ORAL_CAPSULE | Freq: Every day | ORAL | Status: DC
  Filled 2021-07-27: qty 2

## 2021-07-27 MED ORDER — SODIUM CHLORIDE 0.9 % IV SOLN
2.0000 g | Freq: Once | INTRAVENOUS | Status: AC
  Administered 2021-07-27: 2 g via INTRAVENOUS
  Filled 2021-07-27: qty 2

## 2021-07-27 MED ORDER — ONDANSETRON HCL 4 MG/2ML IJ SOLN: 4.0000 mg | Freq: Four times a day (QID) | INTRAMUSCULAR | Status: DC | PRN

## 2021-07-27 MED ORDER — ACETAMINOPHEN 650 MG RE SUPP
650.0000 mg | Freq: Once | RECTAL | Status: AC
Start: 2021-07-27 — End: 2021-07-27
  Administered 2021-07-27: 650 mg via RECTAL
  Filled 2021-07-27: qty 1

## 2021-07-27 MED ORDER — ONDANSETRON HCL 4 MG PO TABS: 4.0000 mg | ORAL_TABLET | Freq: Four times a day (QID) | ORAL | Status: DC | PRN

## 2021-07-27 MED ORDER — METRONIDAZOLE 500 MG/100ML IV SOLN
500.0000 mg | Freq: Two times a day (BID) | INTRAVENOUS | Status: DC
  Administered 2021-07-28: 500 mg via INTRAVENOUS
  Filled 2021-07-27: qty 100

## 2021-07-27 MED ORDER — KETOROLAC TROMETHAMINE 30 MG/ML IJ SOLN
15.0000 mg | Freq: Once | INTRAMUSCULAR | Status: AC
  Administered 2021-07-27: 15 mg via INTRAVENOUS
  Filled 2021-07-27: qty 1

## 2021-07-27 MED ORDER — ACETAMINOPHEN 650 MG RE SUPP: 650.0000 mg | Freq: Four times a day (QID) | RECTAL | Status: DC | PRN

## 2021-07-27 MED ORDER — SODIUM CHLORIDE 0.9 % IV BOLUS
1000.0000 mL | Freq: Once | INTRAVENOUS | Status: AC
  Administered 2021-07-27: 1000 mL via INTRAVENOUS

## 2021-07-27 MED ORDER — TRAZODONE HCL 50 MG PO TABS
25.0000 mg | ORAL_TABLET | Freq: Every evening | ORAL | Status: DC | PRN
  Administered 2021-07-28 – 2021-07-31 (×4): 25 mg via ORAL
  Filled 2021-07-27 (×6): qty 1

## 2021-07-27 MED ORDER — SODIUM CHLORIDE 0.9 % IV SOLN: 2.0000 g | Freq: Three times a day (TID) | INTRAVENOUS | Status: DC

## 2021-07-27 MED ORDER — LITHIUM CARBONATE 300 MG PO TABS: 300.0000 mg | ORAL_TABLET | ORAL | Status: DC

## 2021-07-27 MED ORDER — SODIUM CHLORIDE 0.9 % IV SOLN: 2.0000 g | Freq: Once | INTRAVENOUS | Status: DC

## 2021-07-27 MED ORDER — VANCOMYCIN HCL IN DEXTROSE 1-5 GM/200ML-% IV SOLN
1000.0000 mg | Freq: Once | INTRAVENOUS | Status: DC
  Filled 2021-07-27: qty 200

## 2021-07-27 MED ORDER — QUETIAPINE FUMARATE 25 MG PO TABS
25.0000 mg | ORAL_TABLET | Freq: Every morning | ORAL | Status: DC
  Administered 2021-07-28 – 2021-08-03 (×7): 25 mg via ORAL
  Filled 2021-07-27 (×7): qty 1

## 2021-07-27 MED ORDER — LACTATED RINGERS IV SOLN: INTRAVENOUS | Status: DC

## 2021-07-27 MED ORDER — ENOXAPARIN SODIUM 40 MG/0.4ML IJ SOSY
40.0000 mg | PREFILLED_SYRINGE | INTRAMUSCULAR | Status: DC
  Administered 2021-07-28 – 2021-08-03 (×7): 40 mg via SUBCUTANEOUS
  Filled 2021-07-27 (×7): qty 0.4

## 2021-07-27 MED ORDER — METOPROLOL TARTRATE 5 MG/5ML IV SOLN: 2.5000 mg | Freq: Once | INTRAVENOUS | Status: DC

## 2021-07-27 MED ORDER — SODIUM CHLORIDE 0.9 % IV SOLN: INTRAVENOUS | Status: DC

## 2021-07-27 MED ORDER — MELATONIN 3 MG PO TABS
6.0000 mg | ORAL_TABLET | Freq: Every day | ORAL | Status: DC
  Administered 2021-07-28 – 2021-08-03 (×7): 6 mg via ORAL
  Filled 2021-07-27 (×7): qty 2

## 2021-07-27 MED ORDER — SODIUM CHLORIDE 0.9 % IV BOLUS (SEPSIS)
500.0000 mL | Freq: Once | INTRAVENOUS | Status: AC
  Administered 2021-07-27: 500 mL via INTRAVENOUS

## 2021-07-27 MED ORDER — QUETIAPINE FUMARATE 100 MG PO TABS
100.0000 mg | ORAL_TABLET | Freq: Every day | ORAL | Status: DC
  Administered 2021-07-28 – 2021-08-03 (×7): 100 mg via ORAL
  Filled 2021-07-27 (×7): qty 1

## 2021-07-27 MED ORDER — GABAPENTIN 100 MG PO CAPS
200.0000 mg | ORAL_CAPSULE | Freq: Three times a day (TID) | ORAL | Status: DC
Start: 2021-07-27 — End: 2021-08-04
  Administered 2021-07-28 – 2021-08-03 (×21): 200 mg via ORAL
  Filled 2021-07-27 (×21): qty 2

## 2021-07-27 MED ORDER — LITHIUM CARBONATE 300 MG PO CAPS
300.0000 mg | ORAL_CAPSULE | Freq: Every day | ORAL | Status: DC
  Filled 2021-07-27 (×2): qty 1

## 2021-07-27 NOTE — ED Triage Notes (Signed)
Pt brought in from home by [facility] with c/o AMS. Pt's son reports to EMS that pt has been altered and hollering for the last 4 hours. EMS reports pt was soaked in urine at home. Pt has foul smell of urine on her upon arrival to ED. Pt not redirectable.  ?

## 2021-07-27 NOTE — Progress Notes (Signed)
Pharmacy Antibiotic Note ? ?[name] is a 66 y.o. female admitted on 07/27/2021 with  altered mental status .  Pharmacy has been consulted for Vancomycin and Cefepime dosing for sepsis. ? ?Plan:  ?Vancomycin 1500 mg IV Q x 1 then 1250 mg IV q24 hrs. Goal AUC 400-550. ?Expected AUC: 532.3, SCr used: 0.8 ?Cefepime 2g IV q8hrs ?Monitor clinical status, renal function and culture results daily.  ?Check steady state vancomycin peak and trough per protocol if needed.  ? ? ? ?Height: 5\' 4"  (162.6 cm) ?Weight: 84.9 kg (187 lb 2.7 oz) ?IBW/kg (Calculated) : 54.7 ? ?Temp (24hrs), Avg:100.8 ?F (38.2 ?C), Min:99.7 ?F (37.6 ?C), Max:101.8 ?F (38.8 ?C) ? ?Recent Labs  ?Lab 07/27/21 ?1903  ?WBC 12.9*  ?CREATININE 0.59  ?LATICACIDVEN 2.7*  ?  ?Estimated Creatinine Clearance: 74.9 mL/min (by C-G formula based on SCr of 0.59 mg/dL).   ? ?Allergies  ?Allergen Reactions  ? Sulfa Antibiotics Swelling and Rash  ?  Neck swelling  ? ? ?Antimicrobials this admission: ?Vancomycin 3/15 >>  ?Cefepime 3/15 >> ?Flagyl 3/15>> ? ?Dose adjustments this admission: ? ? ?Microbiology results: ?3/15 BCx nx1: pending ? ?Thank you for allowing pharmacy to be a part of this patient?s care. ? ?Nicole Cella, RPh ?Clinical Pharmacist ? ?07/27/2021 11:38 PM ? ?

## 2021-07-27 NOTE — H&P (Signed)
?  ?  ?[facility] ? ? ?PATIENT NAME: [name]   ? ?MR#:  [id number] ? ?DATE OF BIRTH:  Sep 11, 1955 ? ?DATE OF ADMISSION:  07/27/2021 ? ?PRIMARY CARE PHYSICIAN: Pcp, No  ? ?Patient is coming from: Home ? ?REQUESTING/REFERRING PHYSICIAN: Carmin Muskrat, MD  ?CHIEF COMPLAINT:  ? ?Chief Complaint  ?Patient presents with  ? Altered Mental Status  ? ? ?HISTORY OF PRESENT ILLNESS:  ?[name] is a 66 y.o. Caucasian female with medical history significant for lung cancer status post right upper lobectomy, GERD, dyslipidemia bipolar disorder and major epression and drug abuse, who presented to the ER with acute onset of altered mental status.  The patient has been altered and hollering for the last 4 hours before coming to the ER.  EMS reported that she was soaked in urine at home and was having foul smell of her urine.  She has a known history of schizophrenia and therefore was a very poor historian.  She was slightly agitated and restless in the ER.  No reported nausea or vomiting.  No other history could be obtained. ? ?ED Course: Upon presentation to the emergency room, BP was 148/98 with a heart rate of 115 and respiratory rate of 26.  Temperature was 99.7.  Labs revealed glucose of 143 and albumin 3.4 with total bili of 1.3 and otherwise unremarkable CMP.  Lactic acid was 2.7.  CBC showed leukocytosis of 12.9 with neutrophilia and thrombocytosis of 401 showed 21-56.  30 protein and small leukocytes.  Alcohol was less than 10 and urine drug screen was negative.  Stopped ?EKG as reviewed by me : Initial EKG showed sinus tachycardia with a rate of 111 with nonspecific intraventricular conduction delay and borderline repolarization abnormality was suboptimal quality.  Repeat EKG showed sinus tachycardia with rate 128 with low voltage QRS. ?Imaging: Portable chest ray showed no acute cardiopulmonary disease. ? ?The patient was given IV vancomycin and cefepime, midodrine, Tylenol 650 mg p.o. and 2 L bolus of IV  normal saline.  She will be admitted to a stepdown unit bed for further evaluation and management. ? ?PAST MEDICAL HISTORY:  ? ?Past Medical History:  ?Diagnosis Date  ? Allergy   ? Depression   ? Drug abuse ([place])   ? history of, went to rehab 2015  ? GERD (gastroesophageal reflux disease)   ? Lung cancer ([place])   ? lung ca dx 11/11- right upper lobe  ? ? ?PAST SURGICAL HISTORY:  ? ?Past Surgical History:  ?Procedure Laterality Date  ? ANKLE FRACTURE SURGERY Left   ? LUNG LOBECTOMY Right 2011  ? RUL removed for lung cancer  ? STYLOID PROCESS EXCISION Left 10/16/2014  ? Procedure: EXCISION LEFT STYLOID PROCESS;  Surgeon: Rozetta Nunnery, MD;  Location: [facility];  Service: ENT;  Laterality: Left;  ? TONSILLECTOMY Bilateral 10/16/2014  ? Procedure: BILATERAL TONSILLECTOMY;  Surgeon: Rozetta Nunnery, MD;  Location: [facility];  Service: ENT;  Laterality: Bilateral;  ? [place]  ? ? ?SOCIAL HISTORY:  ? ?Social History  ? ?Tobacco Use  ? Smoking status: Every Day  ?  Packs/day: 1.00  ?  Years: 40.00  ?  Pack years: 40.00  ?  Types: Cigarettes  ? Smokeless tobacco: Never  ?Substance Use Topics  ? Alcohol use: Not Currently  ?  Comment: hx alcoholism.  none since 2017  ? ? ?FAMILY HISTORY:  ? ?Family History  ?Problem Relation Age of Onset  ?  Cancer Mother   ?     breast cancer  ? Cancer Father   ?     stomach cancer  ? Diabetes Father   ? Cancer Sister   ?     breast cancer  ? Diabetes Brother   ? Diabetes Brother   ? Cancer Sister   ?     breast cancer  ? ? ?DRUG ALLERGIES:  ? ?Allergies  ?Allergen Reactions  ? Sulfa Antibiotics Swelling and Rash  ?  Neck swelling  ? ? ?REVIEW OF SYSTEMS:  ? ?ROS ?As per history of present illness. All pertinent systems were reviewed above. Constitutional, HEENT, cardiovascular, respiratory, GI, GU, musculoskeletal, neuro, psychiatric, endocrine, integumentary and hematologic systems were reviewed and are otherwise negative/unremarkable  except for positive findings mentioned above in the HPI. ? ? ?MEDICATIONS AT HOME:  ? ?Prior to Admission medications   ?Medication Sig Start Date End Date Taking? Authorizing Provider  ?folic acid (FOLVITE) 1 MG tablet Take 1 mg by mouth daily.   Yes [provider]  ?gabapentin (NEURONTIN) 100 MG capsule Take 200 mg by mouth 3 (three) times daily. 07/20/21  Yes [provider]  ?lithium 300 MG tablet Take 300 mg by mouth See admin instructions. The 1 tablet in the morning and 2 tablets every evening. 07/20/21  Yes [provider]  ?MELATONIN PO Take 1 tablet by mouth every evening.   Yes [provider]  ?midodrine (PROAMATINE) 5 MG tablet Take 5 mg by mouth 3 (three) times daily. 07/20/21  Yes [provider]  ?QUEtiapine (SEROQUEL) 100 MG tablet Take 100 mg by mouth at bedtime. 07/20/21  Yes [provider]  ?QUEtiapine (SEROQUEL) 25 MG tablet Take 25 mg by mouth in the morning. 07/21/21 08/20/21 Yes [provider]  ?albuterol (PROVENTIL HFA;VENTOLIN HFA) 108 (90 Base) MCG/ACT inhaler Inhale 2 puffs into the lungs every 6 (six) hours as needed for wheezing or shortness of breath. ?Patient not taking: Reported on 07/27/2021 03/05/18   Soyla Dryer, PA-C  ?atorvastatin (LIPITOR) 20 MG tablet Take 1 tablet (20 mg total) by mouth daily. ?Patient not taking: Reported on 07/27/2021 03/05/18   Soyla Dryer, PA-C  ?predniSONE (DELTASONE) 20 MG tablet Take 2 tablets (40 mg total) by mouth daily with breakfast. ?Patient not taking: Reported on 07/27/2021 03/05/18   Soyla Dryer, PA-C  ?traZODone (DESYREL) 100 MG tablet Take 100 mg by mouth at bedtime. ?Patient not taking: Reported on 07/27/2021    [provider]  ? ?  ? ?VITAL SIGNS:  ?Blood pressure (!) 94/57, pulse (!) 108, temperature (!) 101 ?F (38.3 ?C), resp. rate (!) 27, height 5\' 4"  (1.626 m), weight 84.9 kg, SpO2 100 %. ? ?PHYSICAL EXAMINATION:  ?Physical Exam ? ?GENERAL:  66 y.o.-year-old ill  looking Caucasian female patient lying in the bed with no acute distress.  She is globally confused and disoriented. ?EYES: Pupils equal, round, reactive to light and accommodation. No scleral icterus. Extraocular muscles intact.  ?HEENT: Head atraumatic, normocephalic. Oropharynx and nasopharynx clear.  ?NECK:  Supple, no jugular venous distention. No thyroid enlargement, no tenderness.  ?LUNGS: Normal breath sounds bilaterally, no wheezing, rales,rhonchi or crepitation. No use of accessory muscles of respiration.  ?CARDIOVASCULAR: Regular rate and rhythm, S1, S2 normal. No murmurs, rubs, or gallops.  ?ABDOMEN: Soft, nondistended, nontender. Bowel sounds present. No organomegaly or mass.  Left lower quadrant colostomy bag in place. ?EXTREMITIES: No pedal edema, cyanosis, or clubbing.  ?NEUROLOGIC: Grossly nonfocal.  She is moving  all 4 extremities but does not follow commands. ?PSYCHIATRIC: The patient is globally confused.  No good eye contact. ?SKIN: No obvious rash, lesion, or ulcer.  ? ?LABORATORY PANEL:  ? ?CBC ?Recent Labs  ?Lab 07/27/21 ?1903  ?WBC 12.9*  ?HGB 13.2  ?HCT 41.2  ?PLT 401*  ? ?------------------------------------------------------------------------------------------------------------------ ? ?Chemistries  ?Recent Labs  ?Lab 07/27/21 ?1903  ?NA 137  ?K 3.5  ?CL 100  ?CO2 24  ?GLUCOSE 143*  ?BUN 13  ?CREATININE 0.59  ?CALCIUM 9.9  ?AST 17  ?ALT 18  ?ALKPHOS 83  ?BILITOT 1.3*  ? ?------------------------------------------------------------------------------------------------------------------ ? ?Cardiac Enzymes ?No results for input(s): TROPONINI in the last 168 hours. ?------------------------------------------------------------------------------------------------------------------ ? ?RADIOLOGY:  ?DG Chest Port 1 View ? ?Result Date: 07/27/2021 ?CLINICAL DATA:  Altered level of consciousness, history of right upper lobe lung cancer EXAM: PORTABLE CHEST 1 VIEW COMPARISON:  07/08/2021 FINDINGS:  Single frontal view of the chest demonstrates an unremarkable cardiac silhouette. Postsurgical changes identified consistent with prior right upper lobectomy. No acute airspace disease, effusion, or pneumothorax

## 2021-07-27 NOTE — ED Notes (Signed)
EDP at bedside  

## 2021-07-27 NOTE — ED Notes (Signed)
EDP made aware of rectal temperature, HR, and respiratory changes.  ?

## 2021-07-27 NOTE — ED Provider Notes (Addendum)
?[facility] ?Provider Note ? ? ?CSN: [id number] ?Arrival date & time: 07/27/21  1832 ? ?  ? ?History ? ?Chief Complaint  ?Patient presents with  ? Altered Mental Status  ? ? ?[name] is a 66 y.o. female. ? ?HPI ?Patient presents from home via EMS with family concern of altered mental status.  Patient has no history of schizophrenia, cannot provide any details, level 5 caveat. ?Per EMS family notes recent urinary tract infection, increasing difficulty with controlling the patient's mood, behavior at home, given history of schizophrenia. ?  ? ?Home Medications ?Prior to Admission medications   ?Medication Sig Start Date End Date Taking? Authorizing Provider  ?folic acid (FOLVITE) 1 MG tablet Take 1 mg by mouth daily.   Yes [provider]  ?gabapentin (NEURONTIN) 100 MG capsule Take 200 mg by mouth 3 (three) times daily. 07/20/21  Yes [provider]  ?lithium 300 MG tablet Take 300 mg by mouth See admin instructions. The 1 tablet in the morning and 2 tablets every evening. 07/20/21  Yes [provider]  ?MELATONIN PO Take 1 tablet by mouth every evening.   Yes [provider]  ?midodrine (PROAMATINE) 5 MG tablet Take 5 mg by mouth 3 (three) times daily. 07/20/21  Yes [provider]  ?QUEtiapine (SEROQUEL) 100 MG tablet Take 100 mg by mouth at bedtime. 07/20/21  Yes [provider]  ?QUEtiapine (SEROQUEL) 25 MG tablet Take 25 mg by mouth in the morning. 07/21/21 08/20/21 Yes [provider]  ?albuterol (PROVENTIL HFA;VENTOLIN HFA) 108 (90 Base) MCG/ACT inhaler Inhale 2 puffs into the lungs every 6 (six) hours as needed for wheezing or shortness of breath. ?Patient not taking: Reported on 07/27/2021 03/05/18   Soyla Dryer, PA-C  ?atorvastatin (LIPITOR) 20 MG tablet Take 1 tablet (20 mg total) by mouth daily. ?Patient not taking: Reported on 07/27/2021 03/05/18   Soyla Dryer, PA-C  ?predniSONE (DELTASONE) 20 MG tablet Take 2 tablets  (40 mg total) by mouth daily with breakfast. ?Patient not taking: Reported on 07/27/2021 03/05/18   Soyla Dryer, PA-C  ?traZODone (DESYREL) 100 MG tablet Take 100 mg by mouth at bedtime. ?Patient not taking: Reported on 07/27/2021    [provider]  ?   ? ?Allergies    ?Sulfa antibiotics   ? ?Review of Systems   ?Review of Systems  ?Unable to perform ROS: Psychiatric disorder  ? ?Physical Exam ?Updated Vital Signs ?BP 127/85   Pulse (!) 110   Temp 99.7 ?F (37.6 ?C) (Rectal)   Resp (!) 24   Ht 5\' 4"  (1.626 m)   Wt 84.9 kg   SpO2 100%   BMI 32.13 kg/m?  ?Physical Exam ?Vitals and nursing note reviewed.  ?Constitutional:   ?   Appearance: She is well-developed. She is ill-appearing.  ?   Comments: Unwell appearing adult female sitting upright, moving consistently, yelping, yelling, screaming  ?HENT:  ?   Head: Normocephalic and atraumatic.  ?Eyes:  ?   Conjunctiva/sclera: Conjunctivae normal.  ?Cardiovascular:  ?   Rate and Rhythm: Normal rate and regular rhythm.  ?Pulmonary:  ?   Effort: Pulmonary effort is normal. No respiratory distress.  ?   Breath sounds: Normal breath sounds. No stridor.  ?Abdominal:  ?   General: There is no distension.  ?   Comments: Left lower quadrant ostomy bag unremarkable  ?Skin: ?   General: Skin is warm and dry.  ?Neurological:  ?   Cranial Nerves: No cranial  nerve deficit.  ?   Comments: MAES ?Does not follow commands reliably  ?Psychiatric:     ?   Mood and Affect: Mood normal.     ?   Behavior: Behavior is withdrawn and hyperactive.     ?   Cognition and Memory: Cognition is impaired. Memory is impaired.  ? ? ?ED Results / Procedures / Treatments   ?Labs ?(all labs ordered are listed, but only abnormal results are displayed) ?Labs Reviewed  ?URINALYSIS, ROUTINE W REFLEX MICROSCOPIC - Abnormal; Notable for the following components:  ?    Result Value  ? Hgb urine dipstick SMALL (*)   ? Ketones, ur 5 (*)   ? Protein, ur 30 (*)   ? Leukocytes,Ua SMALL (*)   ?  Bacteria, UA MANY (*)   ? All other components within normal limits  ?COMPREHENSIVE METABOLIC PANEL - Abnormal; Notable for the following components:  ? Glucose, Bld 143 (*)   ? Albumin 3.4 (*)   ? Total Bilirubin 1.3 (*)   ? All other components within normal limits  ?LACTIC ACID, PLASMA - Abnormal; Notable for the following components:  ? Lactic Acid, Venous 2.7 (*)   ? All other components within normal limits  ?CBC WITH DIFFERENTIAL/PLATELET - Abnormal; Notable for the following components:  ? WBC 12.9 (*)   ? MCV 100.5 (*)   ? Platelets 401 (*)   ? Neutro Abs 10.9 (*)   ? All other components within normal limits  ?RESP PANEL BY RT-PCR (FLU A&B, COVID) ARPGX2  ?ETHANOL  ?RAPID URINE DRUG SCREEN, HOSP PERFORMED  ? ? ?EKG ?EKG Interpretation ? ?Date/Time:  Wednesday July 27 2021 20:03:16 EDT ?Ventricular Rate:  111 ?PR Interval:  109 ?QRS Duration: 134 ?QT Interval:  317 ?QTC Calculation: 431 ?R Axis:   51 ?Text Interpretation: Sinus tachycardia Nonspecific intraventricular conduction delay Borderline repolarization abnormality Artifact in lead(s) I II III aVR aVL aVF V1 V2 V3 V4 V5 V6 Abnormal ECG Confirmed by Carmin Muskrat [phone]) on 07/27/2021 9:30:28 PM ? ?Radiology ?DG Chest Port 1 View ? ?Result Date: 07/27/2021 ?CLINICAL DATA:  Altered level of consciousness, history of right upper lobe lung cancer EXAM: PORTABLE CHEST 1 VIEW COMPARISON:  07/08/2021 FINDINGS: Single frontal view of the chest demonstrates an unremarkable cardiac silhouette. Postsurgical changes identified consistent with prior right upper lobectomy. No acute airspace disease, effusion, or pneumothorax. No acute bony abnormalities. IMPRESSION: 1. No acute intrathoracic process. Electronically Signed   By: Randa Ngo M.D.   On: 07/27/2021 19:14   ? ?Procedures ?Procedures  ? ? ?Medications Ordered in ED ?Medications  ?sodium chloride 0.9 % bolus 1,000 mL (0 mLs Intravenous Stopped 07/27/21 2135)  ?  And  ?0.9 %  sodium chloride infusion  (has no administration in time range)  ?sodium chloride 0.9 % bolus 1,000 mL (has no administration in time range)  ?haloperidol lactate (HALDOL) injection 5 mg (5 mg Intravenous Given 07/27/21 1935)  ? ? ?ED Course/ Medical Decision Making/ A&P ?This patient presents to the ED for concern of altered mental status, this involves an extensive number of treatment options, and is a complaint that carries with it a high risk of complications and morbidity.  The differential diagnosis includes the context of patient with schizophrenia, recent admission for sepsis, urinary tract infection, no baseline difficulty by differential including bacteremia, sepsis, progression of disease, electrolyte abnormalities, urinary tract infection ? ? ?Co morbidities that complicate the patient evaluation ? ?Schizophrenia ? ?Social Determinants of Health: ? ?  Schizophrenia ? ?Additional history obtained: ? ?Additional history and/or information obtained from chart review ?External records from outside source obtained and reviewed including admission for bacteremia last month ? ? ?After the initial evaluation, orders, including: Labs x-ray were initiated. ? ? ?On repeat evaluation of the patient improved ?Substantially Calmer with Haldol ?Lab Tests: ? ?I personally interpreted labs.  The pertinent results include: Mild lactic acidosis, leukocytosis ? ?Imaging Studies ordered: ? ?I independently visualized and interpreted imaging which showed negative for pneumonia ?I agree with the radiologist interpretation ? ?Consultations Obtained: ? ?I requested consultation with the social work team, not available at this hour ? ?Dispostion / Final MDM: ? ?After consideration of the diagnostic results and the patient's response to treatment, she was initially [place], but following after arrival the patient had worsening tachycardia, was found to be febrile.  With tachypnea, fever, though there is not obvious source of infection patient meets SIRS/sepsis  criteria without hypotension.  Patient has recently been admitted for similar circumstances, and broad-spectrum antibiotics were started.  Patient had already received 2 L fluid resuscitation will receive additional

## 2021-07-28 ENCOUNTER — Other Ambulatory Visit: Payer: Self-pay

## 2021-07-28 ENCOUNTER — Encounter (HOSPITAL_COMMUNITY): Payer: Self-pay | Admitting: Family Medicine

## 2021-07-28 ENCOUNTER — Inpatient Hospital Stay (HOSPITAL_COMMUNITY): Payer: Self-pay

## 2021-07-28 DIAGNOSIS — G9341 Metabolic encephalopathy: Secondary | ICD-10-CM | POA: Diagnosis present

## 2021-07-28 DIAGNOSIS — A419 Sepsis, unspecified organism: Secondary | ICD-10-CM | POA: Diagnosis present

## 2021-07-28 DIAGNOSIS — R6520 Severe sepsis without septic shock: Secondary | ICD-10-CM | POA: Diagnosis present

## 2021-07-28 DIAGNOSIS — E785 Hyperlipidemia, unspecified: Secondary | ICD-10-CM | POA: Diagnosis present

## 2021-07-28 DIAGNOSIS — N390 Urinary tract infection, site not specified: Secondary | ICD-10-CM | POA: Diagnosis present

## 2021-07-28 LAB — PROCALCITONIN: Procalcitonin: 0.61 ng/mL

## 2021-07-28 LAB — URINALYSIS, ROUTINE W REFLEX MICROSCOPIC
Bilirubin Urine: NEGATIVE
Glucose, UA: NEGATIVE mg/dL
Ketones, ur: 5 mg/dL — AB
Nitrite: NEGATIVE
Protein, ur: 30 mg/dL — AB
Specific Gravity, Urine: 1.017 (ref 1.005–1.030)
pH: 6 (ref 5.0–8.0)

## 2021-07-28 LAB — CORTISOL-AM, BLOOD: Cortisol - AM: 11.3 ug/dL (ref 6.7–22.6)

## 2021-07-28 LAB — CBC
HCT: 33.1 % — ABNORMAL LOW (ref 36.0–46.0)
Hemoglobin: 10 g/dL — ABNORMAL LOW (ref 12.0–15.0)
MCH: 31.3 pg (ref 26.0–34.0)
MCHC: 30.2 g/dL (ref 30.0–36.0)
MCV: 103.8 fL — ABNORMAL HIGH (ref 80.0–100.0)
Platelets: 288 10*3/uL (ref 150–400)
RBC: 3.19 MIL/uL — ABNORMAL LOW (ref 3.87–5.11)
RDW: 14.4 % (ref 11.5–15.5)
WBC: 8.9 10*3/uL (ref 4.0–10.5)
nRBC: 0 % (ref 0.0–0.2)

## 2021-07-28 LAB — BASIC METABOLIC PANEL
Anion gap: 6 (ref 5–15)
BUN: 14 mg/dL (ref 8–23)
CO2: 19 mmol/L — ABNORMAL LOW (ref 22–32)
Calcium: 7.9 mg/dL — ABNORMAL LOW (ref 8.9–10.3)
Chloride: 115 mmol/L — ABNORMAL HIGH (ref 98–111)
Creatinine, Ser: 0.72 mg/dL (ref 0.44–1.00)
GFR, Estimated: >60 mL/min (ref >60)
Glucose, Bld: 106 mg/dL — ABNORMAL HIGH (ref 70–99)
Potassium: 3.3 mmol/L — ABNORMAL LOW (ref 3.5–5.1)
Sodium: 140 mmol/L (ref 135–145)

## 2021-07-28 LAB — LITHIUM LEVEL: Lithium Lvl: 1.5 mmol/L — ABNORMAL HIGH (ref 0.60–1.20)

## 2021-07-28 LAB — HIV ANTIBODY (ROUTINE TESTING W REFLEX): HIV Screen 4th Generation wRfx: NONREACTIVE

## 2021-07-28 LAB — MRSA NEXT GEN BY PCR, NASAL: MRSA by PCR Next Gen: NOT DETECTED

## 2021-07-28 LAB — LACTIC ACID, PLASMA
Lactic Acid, Venous: 0.7 mmol/L (ref 0.5–1.9)
Lactic Acid, Venous: 0.9 mmol/L (ref 0.5–1.9)

## 2021-07-28 LAB — PROTIME-INR
INR: 1.2 (ref 0.8–1.2)
Prothrombin Time: 14.7 seconds (ref 11.4–15.2)

## 2021-07-28 LAB — MAGNESIUM: Magnesium: 1.8 mg/dL (ref 1.7–2.4)

## 2021-07-28 MED ORDER — HALOPERIDOL LACTATE 5 MG/ML IJ SOLN
1.0000 mg | INTRAMUSCULAR | Status: DC | PRN
  Administered 2021-07-28 – 2021-08-02 (×3): 1 mg via INTRAVENOUS
  Filled 2021-07-28 (×3): qty 1

## 2021-07-28 MED ORDER — ACETAMINOPHEN 650 MG RE SUPP
650.0000 mg | Freq: Once | RECTAL | Status: AC
  Administered 2021-07-28: 650 mg via RECTAL
  Filled 2021-07-28: qty 1

## 2021-07-28 MED ORDER — SODIUM CHLORIDE 0.9 % IV BOLUS
500.0000 mL | Freq: Once | INTRAVENOUS | Status: AC
Start: 2021-07-29 — End: 2021-07-29
  Administered 2021-07-29: 500 mL via INTRAVENOUS

## 2021-07-28 MED ORDER — SODIUM CHLORIDE 0.9 % IV BOLUS
500.0000 mL | Freq: Once | INTRAVENOUS | Status: AC
  Administered 2021-07-28: 500 mL via INTRAVENOUS

## 2021-07-28 MED ORDER — LITHIUM CARBONATE 150 MG PO CAPS
600.0000 mg | ORAL_CAPSULE | Freq: Every day | ORAL | Status: DC
Start: 2021-07-29 — End: 2021-08-04
  Administered 2021-07-29 – 2021-08-02 (×5): 600 mg via ORAL
  Filled 2021-07-28 (×8): qty 4

## 2021-07-28 MED ORDER — ORAL CARE MOUTH RINSE
15.0000 mL | Freq: Two times a day (BID) | OROMUCOSAL | Status: DC
  Administered 2021-07-28 – 2021-08-03 (×14): 15 mL via OROMUCOSAL

## 2021-07-28 MED ORDER — MIDODRINE HCL 5 MG PO TABS
10.0000 mg | ORAL_TABLET | Freq: Three times a day (TID) | ORAL | Status: DC
  Administered 2021-07-28 – 2021-07-29 (×5): 10 mg via ORAL
  Filled 2021-07-28 (×6): qty 2
  Filled 2021-07-28: qty 12
  Filled 2021-07-28: qty 2

## 2021-07-28 MED ORDER — LACTATED RINGERS IV SOLN: INTRAVENOUS | Status: DC

## 2021-07-28 MED ORDER — POTASSIUM CHLORIDE 10 MEQ/100ML IV SOLN
10.0000 meq | INTRAVENOUS | Status: AC
  Administered 2021-07-28 (×4): 10 meq via INTRAVENOUS
  Filled 2021-07-28 (×4): qty 100

## 2021-07-28 MED ORDER — SODIUM CHLORIDE 0.9 % IV SOLN
2.0000 g | INTRAVENOUS | Status: DC
  Administered 2021-07-28 – 2021-07-31 (×4): 2 g via INTRAVENOUS
  Filled 2021-07-28 (×4): qty 20

## 2021-07-28 MED ORDER — LITHIUM CARBONATE 150 MG PO CAPS
300.0000 mg | ORAL_CAPSULE | Freq: Every day | ORAL | Status: DC
Start: 2021-07-29 — End: 2021-08-04
  Administered 2021-07-29 – 2021-08-03 (×6): 300 mg via ORAL
  Filled 2021-07-28: qty 1
  Filled 2021-07-28 (×7): qty 2

## 2021-07-28 MED ORDER — SODIUM CHLORIDE 0.9 % IV BOLUS
1000.0000 mL | Freq: Once | INTRAVENOUS | Status: AC
  Administered 2021-07-28: 1000 mL via INTRAVENOUS

## 2021-07-28 MED ORDER — CHLORHEXIDINE GLUCONATE CLOTH 2 % EX PADS
6.0000 | MEDICATED_PAD | Freq: Every day | CUTANEOUS | Status: DC
  Administered 2021-07-28 – 2021-08-03 (×7): 6 via TOPICAL

## 2021-07-28 MED ORDER — HALOPERIDOL LACTATE 5 MG/ML IJ SOLN: 1.0000 mg | Freq: Four times a day (QID) | INTRAMUSCULAR | Status: DC | PRN

## 2021-07-28 NOTE — NC FL2 (Signed)
?[place] MEDICAID FL2 LEVEL OF CARE SCREENING TOOL  ?  ? ?IDENTIFICATION  ?Patient Name: ?[name] Birthdate: [birth year] Sex: female Admission Date (Current Location): ?07/27/2021  ?[place] and [place] Number: ? [place] and Address:  ?[facility] [address] [place] ?     Provider Number: ?[id number]  ?Attending Physician Name and Address:  ?Murlean Iba, MD ? Relative Name and Phone Number:  ?Melene Muller - son  [phone] ?   ?Current Level of Care: ?Hospital Recommended Level of Care: ?[place] Prior Approval Number: ?  ? ?Date Approved/Denied: ?  PASRR Number: ?Pending ? ?Discharge Plan: ?SNF ?  ? ?Current Diagnoses: ?Patient Active Problem List  ? Diagnosis Date Noted  ? Sepsis due to gram-negative UTI ([place]) 07/28/2021  ? Severe sepsis ([place]) 07/28/2021  ? Dyslipidemia 07/28/2021  ? Acute metabolic encephalopathy 93/71/6967  ? 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 Severe bipolar I disorder, most recent episode depressed ([place])   ? 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 Lung cancer ([place]) 04/24/2011  ? ? ?Orientation RESPIRATION BLADDER Height & Weight   ?  ?Self, Place ? Normal Continent Weight: 69.8 kg ?Height:  5\' 4"  (162.6 cm)  ?BEHAVIORAL SYMPTOMS/MOOD NEUROLOGICAL BOWEL NUTRITION STATUS  ?    Continent Diet (See DC Summary)  ?AMBULATORY STATUS COMMUNICATION OF NEEDS Skin   ?Extensive Assist Verbally Normal ?  ?  ?  ?    ?     ?     ? ? ?Personal Care Assistance Level of Assistance  ?Bathing, Feeding, Dressing Bathing Assistance: Maximum assistance ?Feeding assistance: Independent ?Dressing Assistance: Limited assistance ?   ? ?Functional Limitations Info  ?Sight, Hearing, Speech Sight Info: Adequate ?Hearing Info: Adequate ?Speech Info: Adequate  ? ? ?SPECIAL CARE FACTORS FREQUENCY  ?PT (By licensed PT)   ?  ?PT Frequency: times a week ?  ?   ?  ?  ?   ? ? ?Contractures Contractures Info: Not present  ? ? ?Additional Factors Info  ?Code Status, Allergies Code Status Info: Full ?Allergies Info: Sulfa ?  ?  ?  ?   ? ?Current Medications (07/28/2021):  This is the current hospital active medication list ?Current Facility-Administered Medications  ?Medication Dose Route Frequency Provider Last Rate Last Admin  ? 0.9 %  sodium chloride infusion   Intravenous Continuous Carmin Muskrat, MD      ? acetaminophen (TYLENOL) tablet 650 mg  650 mg Oral Q6H PRN Mansy, Jan A, MD      ? Or  ? acetaminophen (TYLENOL) suppository 650 mg  650 mg Rectal Q6H PRN Mansy, Jan A, MD      ? cefTRIAXone (ROCEPHIN) 2 g in sodium chloride 0.9 % 100 mL IVPB  2 g Intravenous Q24H Mansy, Arvella Merles, MD   Stopped at 07/28/21 0557  ? Chlorhexidine Gluconate Cloth 2 % PADS 6 each  6 each Topical Daily Wynetta Emery, Clanford L, MD   6 each at 07/28/21 1202  ? enoxaparin (LOVENOX) injection 40 mg  40 mg Subcutaneous Q24H Mansy, Jan A, MD   40 mg at 07/28/21 0747  ? folic acid (FOLVITE) tablet 1 mg  1 mg Oral Daily Mansy, Jan A, MD   1 mg at 07/28/21 0747  ? gabapentin (NEURONTIN) capsule 200 mg  200 mg Oral TID Carmin Muskrat, MD   200 mg at 07/28/21 0747  ?  haloperidol lactate (HALDOL) injection 1 mg  1 mg Intravenous Q4H PRN Johnson, Clanford L, MD   1 mg at 07/28/21 1347  ? labetalol (NORMODYNE) injection 20 mg  20 mg Intravenous Q3H PRN Mansy, Jan A, MD      ? lactated ringers infusion   Intravenous Continuous Wynetta Emery, Clanford L, MD 125 mL/hr at 07/28/21 1032 Infusion Verify at 07/28/21 1032  ? [START ON 07/29/2021] lithium carbonate capsule 300 mg  300 mg Oral Q breakfast Wynetta Emery, Clanford L, MD      ? And  ? [START ON 07/29/2021] lithium carbonate capsule 600 mg  600 mg Oral Q supper Johnson, Clanford L, MD      ? magnesium hydroxide (MILK OF MAGNESIA) suspension 30 mL  30 mL Oral Daily PRN Mansy, Jan A, MD      ? MEDLINE mouth rinse  15 mL Mouth Rinse BID Mansy, Jan A, MD   15 mL at  07/28/21 0747  ? melatonin tablet 6 mg  6 mg Oral QHS Carmin Muskrat, MD      ? midodrine (PROAMATINE) tablet 10 mg  10 mg Oral TID WC Mansy, Jan A, MD   10 mg at 07/28/21 1138  ? ondansetron (ZOFRAN) tablet 4 mg  4 mg Oral Q6H PRN Mansy, Jan A, MD      ? Or  ? ondansetron [facility]) injection 4 mg  4 mg Intravenous Q6H PRN Mansy, Jan A, MD      ? QUEtiapine (SEROQUEL) tablet 100 mg  100 mg Oral QHS Carmin Muskrat, MD      ? QUEtiapine (SEROQUEL) tablet 25 mg  25 mg Oral q AM Carmin Muskrat, MD   25 mg at 07/28/21 0745  ? traZODone (DESYREL) tablet 25 mg  25 mg Oral QHS PRN Mansy, Arvella Merles, MD      ? ? ? ?Discharge Medications: ?Please see discharge summary for a list of discharge medications. ? ?Relevant Imaging Results: ? ?Relevant Lab Results: ? ? ?Additional Information ?SS# [id number] ? ?Boneta Lucks, RN ? ? ? ? ?

## 2021-07-28 NOTE — Progress Notes (Signed)
?PROGRESS NOTE ? ? ?[name]  LKG:[id number] DOB: 13-Dec-1955 DOA: 07/27/2021 ?PCP: Pcp, No  ? ?Chief Complaint  ?Patient presents with  ? Altered Mental Status  ? ?Level of care: Stepdown ? ?Brief Admission History:  ?65 y.o. Caucasian female with medical history significant for lung cancer status post right upper lobectomy, GERD, dyslipidemia bipolar disorder and major epression and drug abuse, who presented to the ER with acute onset of altered mental status.  The patient has been altered and hollering for the last 4 hours before coming to the ER.  EMS reported that she was soaked in urine at home and was having foul smell of her urine.  She has a known history of schizophrenia and therefore was a very poor historian.  She was slightly agitated and restless in the ER.  No reported nausea or vomiting.  No other history could be obtained. ? ?ED Course: Upon presentation to the emergency room, BP was 148/98 with a heart rate of 115 and respiratory rate of 26.  Temperature was 99.7.  Labs revealed glucose of 143 and albumin 3.4 with total bili of 1.3 and otherwise unremarkable CMP.  Lactic acid was 2.7.  CBC showed leukocytosis of 12.9 with neutrophilia and thrombocytosis of 401 showed 21-56.  30 protein and small leukocytes.  Alcohol was less than 10 and urine drug screen was negative.  Stopped ?EKG as reviewed by me : Initial EKG showed sinus tachycardia with a rate of 111 with nonspecific intraventricular conduction delay and borderline repolarization abnormality was suboptimal quality.  Repeat EKG showed sinus tachycardia with rate 128 with low voltage QRS. ?Imaging: Portable chest ray showed no acute cardiopulmonary disease. ? ?The patient was given IV vancomycin and cefepime, midodrine, Tylenol 650 mg p.o. and 2 L bolus of IV normal saline.  She will be admitted to a stepdown unit bed for further evaluation and management. ?  ?07/28/2021:  Pt remains very weak.  She is taking her medication at this time.  She  has a wound on her left ankle and sent for xray.  ?  ?Assessment and Plan: ?Acute metabolic encephalopathy ?- This likely secondary to her sepsis and UTI. ?- Management as above and follow mental status. ? ?Severe sepsis ([place]) ?- This is manifested by elevated lactic acid of 2.7 and later 3.3 as well as mild hypotension.  Improving with hydration.   ? ? ?Sepsis due to gram-negative UTI ([place]) ?- Sepsis is manifested by fever, tachycardia, tachypnea and leukocytosis. ?- continue stepdown unit bed. ?- We will continue antibiotic therapy with IV Rocephin. ?- The patient will be hydrated with IV normal saline. ?- We will follow lactic acid level. ?- We will follow blood and urine cultures. NGTD ? ?Severe bipolar I disorder, most recent episode depressed ([place]) ?- We will continue lithium and Seroquel as well as Zoloft. ? ?Dyslipidemia ?- We will continue statin therapy. ? ?DVT prophylaxis: enoxaparin ?Code Status: Full  ?Family Communication:  ?Disposition: Status is: Inpatient ?Remains inpatient appropriate because: IV fluid management required ?  ?Consultants:  ? ?Procedures:  ? ?Antimicrobials:  ?  ?Subjective: ?Pt reports that she feels very tired today and she has wound left ankle.  ?Objective: ?Vitals:  ? 07/28/21 1137 07/28/21 1200 07/28/21 1230 07/28/21 1612  ?BP:  (!) 108/43 103/69   ?Pulse:  98 98   ?Resp:      ?Temp: 98.3 ?F (36.8 ?C)   99.9 ?F (37.7 ?C)  ?TempSrc: Axillary   Axillary  ?SpO2:  99%  99%   ?Weight:      ?Height:      ? ? ?Intake/Output Summary (Last 24 hours) at 07/28/2021 1704 ?Last data filed at 07/28/2021 1409 ?Gross per 24 hour  ?Intake 2159.38 ml  ?Output 1300 ml  ?Net 859.38 ml  ? ?Filed Weights  ? 07/27/21 1841 07/28/21 0229  ?Weight: 84.9 kg 69.8 kg  ? ?Examination: ? ?General exam: Appears calm and comfortable  ?Respiratory system: Clear to auscultation. Respiratory effort normal. ?Cardiovascular system: normal S1 & S2 heard. No JVD, murmurs, rubs, gallops or clicks. No pedal  edema. ?Gastrointestinal system: Abdomen is nondistended, soft and nontender. No organomegaly or masses felt. Normal bowel sounds heard. ?Central nervous system: Alert and oriented. No focal neurological deficits. ?Extremities: unstageable left ankle wound. Symmetric 5 x 5 power. ?Skin: No rashes, lesions or ulcers. ?Psychiatry: Judgement and insight appear normal. Mood & affect flat.  ? ?  ?Data Reviewed: I have personally reviewed following labs and imaging studies ? ?CBC: ?Recent Labs  ?Lab 07/27/21 ?1903 07/28/21 ?0356  ?WBC 12.9* 8.9  ?NEUTROABS 10.9*  --   ?HGB 13.2 10.0*  ?HCT 41.2 33.1*  ?MCV 100.5* 103.8*  ?PLT 401* 288  ? ? ?Basic Metabolic Panel: ?Recent Labs  ?Lab 07/27/21 ?1903 07/28/21 ?0356  ?NA 137 140  ?K 3.5 3.3*  ?CL 100 115*  ?CO2 24 19*  ?GLUCOSE 143* 106*  ?BUN 13 14  ?CREATININE 0.59 0.72  ?CALCIUM 9.9 7.9*  ?MG  --  1.8  ? ? ?CBG: ?No results for input(s): GLUCAP in the last 168 hours. ? ?Recent Results (from the past 240 hour(s))  ?Resp Panel by RT-PCR (Flu A&B, Covid) Nasopharyngeal Swab     Status: None  ? Collection Time: 07/27/21  8:50 PM  ? Specimen: Nasopharyngeal Swab; Nasopharyngeal(NP) swabs in vial transport medium  ?Result Value Ref Range Status  ? SARS Coronavirus 2 by RT PCR NEGATIVE NEGATIVE Final  ?  Comment: (NOTE) ?SARS-CoV-2 target nucleic acids are NOT DETECTED. ? ?The SARS-CoV-2 RNA is generally detectable in upper respiratory ?specimens during the acute phase of infection. The lowest ?concentration of SARS-CoV-2 viral copies this assay can detect is ?138 copies/mL. A negative result does not preclude SARS-Cov-2 ?infection and should not be used as the sole basis for treatment or ?other patient management decisions. A negative result may occur with  ?improper specimen collection/handling, submission of specimen other ?than nasopharyngeal swab, presence of viral mutation(s) within the ?areas targeted by this assay, and inadequate number of viral ?copies(<138 copies/mL). A  negative result must be combined with ?clinical observations, patient history, and epidemiological ?information. The expected result is Negative. ? ?Fact Sheet for Patients:  ?EntrepreneurPulse.com.au ? ?Fact Sheet for Healthcare Providers:  ?IncredibleEmployment.be ? ?This test is no t yet approved or cleared by the [place] FDA and  ?has been authorized for detection and/or diagnosis of SARS-CoV-2 by ?FDA under an Emergency Use Authorization (EUA). This EUA will remain  ?in effect (meaning this test can be used) for the duration of the ?COVID-19 declaration under Section 564(b)(1) of the Act, 21 ?U.S.C.section 360bbb-3(b)(1), unless the authorization is terminated  ?or revoked sooner.  ? ? ?  ? Influenza A by PCR NEGATIVE NEGATIVE Final  ? Influenza B by PCR NEGATIVE NEGATIVE Final  ?  Comment: (NOTE) ?The Xpert Xpress SARS-CoV-2/FLU/RSV plus assay is intended as an aid ?in the diagnosis of influenza from Nasopharyngeal swab specimens and ?should not be used as a sole basis for treatment. Nasal washings and ?  aspirates are unacceptable for Xpert Xpress SARS-CoV-2/FLU/RSV ?testing. ? ?Fact Sheet for Patients: ?EntrepreneurPulse.com.au ? ?Fact Sheet for Healthcare Providers: ?IncredibleEmployment.be ? ?This test is not yet approved or cleared by the [place] FDA and ?has been authorized for detection and/or diagnosis of SARS-CoV-2 by ?FDA under an Emergency Use Authorization (EUA). This EUA will remain ?in effect (meaning this test can be used) for the duration of the ?COVID-19 declaration under Section 564(b)(1) of the Act, 21 U.S.C. ?section 360bbb-3(b)(1), unless the authorization is terminated or ?revoked. ? ?Performed at [facility], [address] [place], [place] [address] ?  ?Culture, blood (single)     Status: None (Preliminary result)  ? Collection Time: 07/27/21 10:31 PM  ? Specimen: Right Antecubital; Blood  ?Result Value Ref  Range Status  ? Specimen Description RIGHT ANTECUBITAL  Final  ? Special Requests   Final  ?  BOTTLES DRAWN AEROBIC AND ANAEROBIC Blood Culture adequate volume  ? Culture   Final  ?  NO GROWTH < 12 HOURS ?Perform

## 2021-07-28 NOTE — TOC Initial Note (Addendum)
Transition of Care (TOC) - Initial/Assessment Note  ? ? ?Patient Details  ?Name: [name] ?MRN: [id number] ?Date of Birth: [birth year] ? ?Transition of Care (TOC) CM/SW Contact:    ?Boneta Lucks, RN ?Phone Number: ?07/28/2021, 4:23 PM ? ?Clinical Narrative:     Patient admitted with Sepsis. Lives alone, per son she can not take care of herself and they can not care for her.  [name] and no insurance. She get $941 a month. They have been working with Rolan Lipa at [facility] and [place].  Ingrid referred out to [employer] for [employer]. Passr is pending. TOC sending out FL2 offering LOG for bed offers. TOC to follow.             ? ? ?Expected Discharge Plan: [place] ?Barriers to Discharge: Continued Medical Work up ? ? ?Patient Goals and CMS Choice ?Patient states their goals for this hospitalization and ongoing recovery are:: want SNF/ LTC ?CMS Medicare.gov Compare Post Acute Care list provided to:: Patient Represenative (must comment) ?Choice offered to / list presented to : Adult Children ? ?Expected Discharge Plan and Services ?Expected Discharge Plan: [place] ?   ?  ?Living arrangements for the past 2 months: Single Family Home ?                ?   ? ?Prior Living Arrangements/Services ?Living arrangements for the past 2 months: [place] ?Lives with:: Self ?  ?        ?Activities of Daily Living ?Home Assistive Devices/Equipment: None ?ADL Screening (condition at time of admission) ?Patient's cognitive ability adequate to safely complete daily activities?: No ?Is the patient deaf or have difficulty hearing?: No ?Does the patient have difficulty seeing, even when wearing glasses/contacts?: No ?Does the patient have difficulty concentrating, remembering, or making decisions?: Yes ?Patient able to express need for assistance with ADLs?: No ?Does the patient have difficulty dressing or bathing?: Yes ?Independently performs ADLs?: No ?Communication: Needs  assistance ?Is this a change from baseline?: Pre-admission baseline ?Dressing (OT): Needs assistance ?Is this a change from baseline?: Pre-admission baseline ?Grooming: Needs assistance ?Is this a change from baseline?: Pre-admission baseline ?Feeding: Needs assistance ?Is this a change from baseline?: Pre-admission baseline ?Bathing: Needs assistance ?Is this a change from baseline?: Pre-admission baseline ?Toileting: Needs assistance ?Is this a change from baseline?: Pre-admission baseline ?In/Out Bed: Needs assistance ?Is this a change from baseline?: Pre-admission baseline ?Walks in Home: Needs assistance ?Is this a change from baseline?: Pre-admission baseline ?Does the patient have difficulty walking or climbing stairs?: Yes ?Weakness of Legs: Both ?Weakness of Arms/Hands: None ? ?Permission Sought/Granted ? SON ?   ?Orientation: : Oriented to Self, Oriented to Place ?Alcohol / Substance Use: Not Applicable ?Psych Involvement: No (comment) ? ?Admission diagnosis:  Disorientation [R41.0] ?Sepsis ([place]) [A41.9] ?Sepsis with encephalopathy without septic shock, due to unspecified organism ([place]) [A41.9, R65.20, G93.40] ?Patient Active Problem List  ? Diagnosis Date Noted  ? Sepsis due to gram-negative UTI ([place]) 07/28/2021  ? Severe sepsis ([place]) 07/28/2021  ? Dyslipidemia 07/28/2021  ? Acute metabolic encephalopathy 76/28/3151  ? MDD (major depressive disorder), recurrent severe, without psychosis ([place]) 11/14/2017  ? Severe bipolar I disorder, most recent episode depressed ([place])   ? 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 Lung cancer ([place]) 04/24/2011  ? ?PCP:  Pcp, No ?Pharmacy:   ?[facility] [place] [place] - [address], [place]  [place] [address] ?Phone: [phone] Fax: [phone] ? ?[place], [place] ?[place] ?[place] [address] ?Phone: [phone] Fax:  [phone] ? ? ?Readmission Risk Interventions ?No flowsheet data found. ? ? ?

## 2021-07-28 NOTE — Assessment & Plan Note (Addendum)
Continue statin therapy.

## 2021-07-28 NOTE — Evaluation (Signed)
Physical Therapy Evaluation ?Patient Details ?Name: [name] ?MRN: [id number] ?DOB: 19-Dec-1955 ?Today's Date: 07/28/2021 ? ?History of Present Illness ? CARLEENA MIRES is a 66 y.o. Caucasian female with medical history significant for lung cancer status post right upper lobectomy, GERD, dyslipidemia bipolar disorder and major epression and drug abuse, who presented to the ER with acute onset of altered mental status.  The patient has been altered and hollering for the last 4 hours before coming to the ER.  EMS reported that she was soaked in urine at home and was having foul smell of her urine.  She has a known history of schizophrenia and therefore was a very poor historian.  She was slightly agitated and restless in the ER.  No reported nausea or vomiting.  No other history could be obtained. ?  ?Clinical Impression ? Patient demonstrates slow labored movement for sitting up at bedside, once seated very shaky with poor carryover for attempting sit to stands due to possible involuntary movement of legs.  Patient put back to bed with Max assist to reposition with family members present in room.  Patient will benefit from continued skilled physical therapy in hospital and recommended venue below to increase strength, balance, endurance for safe ADLs and gait.  ?   ?   ? ?Recommendations for follow up therapy are one component of a multi-disciplinary discharge planning process, led by the attending physician.  Recommendations may be updated based on patient status, additional functional criteria and insurance authorization. ? ?Follow Up Recommendations Skilled nursing-short term rehab (<3 hours/day) ? ?  ?Assistance Recommended at Discharge Frequent or constant Supervision/Assistance  ?Patient can return home with the following ? A lot of help with bathing/dressing/bathroom;A lot of help with walking and/or transfers;Assistance with feeding;Assistance with cooking/housework ? ?  ?Equipment Recommendations None  recommended by PT  ?Recommendations for Other Services ?    ?  ?Functional Status Assessment Patient has had a recent decline in their functional status and demonstrates the ability to make significant improvements in function in a reasonable and predictable amount of time.  ? ?  ?Precautions / Restrictions Precautions ?Precautions: Fall ?Restrictions ?Weight Bearing Restrictions: No  ? ?  ? ?Mobility ? Bed Mobility ?Overal bed mobility: Needs Assistance ?Bed Mobility: Sit to Supine, Supine to Sit ?  ?  ?Supine to sit: Mod assist, Max assist ?Sit to supine: Mod assist, Max assist ?  ?General bed mobility comments: very shaky unsteady movement ?  ? ?Transfers ?  ?  ?  ?  ?  ?  ?  ?  ?  ?  ?  ? ?Ambulation/Gait ?  ?  ?  ?  ?  ?  ?  ?  ? ?Stairs ?  ?  ?  ?  ?  ? ?Wheelchair Mobility ?  ? ?Modified Rankin (Stroke Patients Only) ?  ? ?  ? ?Balance Overall balance assessment: Needs assistance ?Sitting-balance support: Feet supported, No upper extremity supported ?Sitting balance-Leahy Scale: Poor ?Sitting balance - Comments: fair/poor seated at EOB ?  ?  ?  ?  ?  ?  ?  ?  ?  ?  ?  ?  ?  ?  ?  ?   ? ? ? ?Pertinent Vitals/Pain Pain Assessment ?Pain Assessment: No/denies pain  ? ? ?Home Living Family/patient expects to be discharged to:: Private residence ?Living Arrangements: Alone ?Available Help at Discharge: Family;Available PRN/intermittently ?Type of Home: House ?Home Access: Stairs to enter ?Entrance Stairs-Rails: None ?Entrance  Stairs-Number of Steps: 3 ?  ?Home Layout: One level ?Home Equipment: Conservation officer, nature (2 wheels) ?   ?  ?Prior Function Prior Level of Function : Independent/Modified Independent ?  ?  ?  ?  ?  ?  ?Mobility Comments: Community ambulator without AD, drives ?ADLs Comments: assisted by family ?  ? ? ?Hand Dominance  ?   ? ?  ?Extremity/Trunk Assessment  ? Upper Extremity Assessment ?Upper Extremity Assessment: Generalized weakness ?  ? ?Lower Extremity Assessment ?Lower Extremity Assessment:  Generalized weakness ?  ? ?Cervical / Trunk Assessment ?Cervical / Trunk Assessment: Normal  ?Communication  ? Communication: No difficulties  ?Cognition Arousal/Alertness: Awake/alert ?Behavior During Therapy: Anxious, Restless ?Overall Cognitive Status: Impaired/Different from baseline ?Area of Impairment: Orientation ?  ?  ?  ?  ?  ?  ?  ?  ?Orientation Level: Person ?  ?  ?  ?  ?  ?  ?General Comments: Patient presents confused and very anxious ?  ?  ? ?  ?General Comments   ? ?  ?Exercises    ? ?Assessment/Plan  ?  ?PT Assessment Patient needs continued PT services  ?PT Problem List Decreased strength;Decreased activity tolerance;Decreased balance;Decreased mobility ? ?   ?  ?PT Treatment Interventions DME instruction;Gait training;Stair training;Therapeutic activities;Therapeutic exercise;Balance training;Functional mobility training;Patient/family education   ? ?PT Goals (Current goals can be found in the Care Plan section)  ?Acute Rehab PT Goals ?Patient Stated Goal: return home ?PT Goal Formulation: With patient/family ?Time For Goal Achievement: 08/11/21 ?Potential to Achieve Goals: Good ? ?  ?Frequency Min 3X/week ?  ? ? ?Co-evaluation   ?  ?  ?  ?  ? ? ?  ?AM-PAC PT "6 Clicks" Mobility  ?Outcome Measure Help needed turning from your back to your side while in a flat bed without using bedrails?: A Lot ?Help needed moving from lying on your back to sitting on the side of a flat bed without using bedrails?: A Lot ?Help needed moving to and from a bed to a chair (including a wheelchair)?: Total ?Help needed standing up from a chair using your arms (e.g., wheelchair or bedside chair)?: Total ?Help needed to walk in hospital room?: Total ?Help needed climbing 3-5 steps with a railing? : Total ?6 Click Score: 8 ? ?  ?End of Session Equipment Utilized During Treatment: Oxygen ?Activity Tolerance: Patient tolerated treatment well;Patient limited by fatigue ?Patient left: in bed;with call bell/phone within  reach;with family/visitor present ?Nurse Communication: Mobility status ?PT Visit Diagnosis: Unsteadiness on feet (R26.81);Other abnormalities of gait and mobility (R26.89);Muscle weakness (generalized) (M62.81) ?  ? ?Time: 1440-1505 ?PT Time Calculation (min) (ACUTE ONLY): 25 min ? ? ?Charges:   PT Evaluation ?$PT Eval Moderate Complexity: 1 Mod ?PT Treatments ?$Therapeutic Activity: 23-37 mins ?  ?   ? ? ?3:52 PM, 07/28/21 ?Lonell Grandchild, MPT ?Physical Therapist with [place] ?[facility] ?[phone] office ?[phone] mobile phone ? ? ?

## 2021-07-28 NOTE — Assessment & Plan Note (Addendum)
-   This is manifested by elevated lactic acid of 2.7 and later 3.3 as well as mild hypotension.  Improving with hydration.   ?SEPSIS PHYSIOLOGY RESOLVED ?-due to UTI ? ?

## 2021-07-28 NOTE — Plan of Care (Signed)
?  Problem: Acute Rehab PT Goals(only PT should resolve) ?Goal: Pt Will Go Supine/Side To Sit ?Outcome: Progressing ?Flowsheets (Taken 07/28/2021 1554) ?Pt will go Supine/Side to Sit: ? with minimal assist ? with moderate assist ?Goal: Patient Will Transfer Sit To/From Stand ?Outcome: Progressing ?Flowsheets (Taken 07/28/2021 1554) ?Patient will transfer sit to/from stand: with moderate assist ?Goal: Pt Will Transfer Bed To Chair/Chair To Bed ?Outcome: Progressing ?Flowsheets (Taken 07/28/2021 1554) ?Pt will Transfer Bed to Chair/Chair to Bed: with mod assist ?Goal: Pt Will Ambulate ?Outcome: Progressing ?Flowsheets (Taken 07/28/2021 1554) ?Pt will Ambulate: ? 15 feet ? with moderate assist ? with rolling [place] ?  ?3:54 PM, 07/28/21 ?Lonell Grandchild, MPT ?Physical Therapist with [place] ?[facility] ?[phone] office ?[phone] mobile phone ? ?

## 2021-07-28 NOTE — Assessment & Plan Note (Addendum)
-   Secondary to her sepsis and UTI - TREATED AND RESOLVED ?-Mentation seems to be improved to baseline. ?

## 2021-07-28 NOTE — Progress Notes (Signed)
Patient family at bedside. Patient noted to be agitated. Dr. Wynetta Emery made aware. PRN 1mg  of Haldol ordered.  ?

## 2021-07-28 NOTE — Assessment & Plan Note (Addendum)
-   continue lithium and Seroquel as well as Zoloft.  Pt NOT having behavioral issues.  ?-rechecked lithium level shows level-0.34  Continue current dosing.   ?

## 2021-07-28 NOTE — Progress Notes (Signed)
Unstageable pressure injury noted to patient's lateral side of left ankle. Dr. Wynetta Emery made aware. Xray ordered. ? ?Also, patient has not voided this shift. Bladder scan done and shows >954mL. Dr. Wynetta Emery also made aware of this. ?

## 2021-07-28 NOTE — Assessment & Plan Note (Addendum)
-   Sepsis was manifested by fever, tachycardia, tachypnea and leukocytosis which is now RESOLVED.  ?- transfer to med surg ?- antibiotics completed.  ?- The patient was hydrated with IV normal saline. ? ?

## 2021-07-28 NOTE — Sepsis Progress Note (Signed)
Following per sepsis protocol   

## 2021-07-28 NOTE — Hospital Course (Addendum)
66 y.o. Caucasian female with medical history significant for lung cancer status post right upper lobectomy, GERD, dyslipidemia bipolar disorder and major epression and drug abuse, who presented to the ER with acute onset of altered mental status.  The patient has been altered and hollering for the last 4 hours before coming to the ER.  EMS reported that she was soaked in urine at home and was having foul smell of her urine.  She has a known history of schizophrenia and therefore was a very poor historian.  She was slightly agitated and restless in the ER.  No reported nausea or vomiting.  No other history could be obtained. ? ?ED Course: Upon presentation to the emergency room, BP was 148/98 with a heart rate of 115 and respiratory rate of 26.  Temperature was 99.7.  Labs revealed glucose of 143 and albumin 3.4 with total bili of 1.3 and otherwise unremarkable CMP.  Lactic acid was 2.7.  CBC showed leukocytosis of 12.9 with neutrophilia and thrombocytosis of 401 showed 21-56.  30 protein and small leukocytes.  Alcohol was less than 10 and urine drug screen was negative.  Stopped ?EKG as reviewed by me : Initial EKG showed sinus tachycardia with a rate of 111 with nonspecific intraventricular conduction delay and borderline repolarization abnormality was suboptimal quality.  Repeat EKG showed sinus tachycardia with rate 128 with low voltage QRS. ?Imaging: Portable chest ray showed no acute cardiopulmonary disease. ? ?The patient was given IV vancomycin and cefepime, midodrine, Tylenol 650 mg p.o. and 2 L bolus of IV normal saline.  She will be admitted to a stepdown unit bed for further evaluation and management. ?  ?07/28/2021:  Pt remains very weak.  She is taking her medication at this time.  She has a wound on her left ankle and sent for xray.  ? ?07/29/2021: started on IV norepinephrine infusion for persistent low BPs.   ? ?07/30/2021:  Mentation improving.  Eating better today.  More alert and oriented today.   Trial off levophed.  ? ?07/31/2021: remains off levophed. Awaiting placement.  Midodrine for BP support.  Transfer to telemetry from stepdown ICU.  Failed voiding trial and foley had to be re-inserted.   ? ?08/01/2021:  Working with PT. TOC working on disposition.  Medicaid pending.  DC cardiac monitoring.  ? ?08/02/2021:  Pt medically stable to discharge from hospital but needs placement.  APS involved and cannot go back to home alone.  TOC working on some type of placement.  Continue current management for now.   ?

## 2021-07-29 DIAGNOSIS — S91002A Unspecified open wound, left ankle, initial encounter: Secondary | ICD-10-CM | POA: Diagnosis present

## 2021-07-29 DIAGNOSIS — I959 Hypotension, unspecified: Secondary | ICD-10-CM | POA: Diagnosis present

## 2021-07-29 LAB — CBC
HCT: 30.7 % — ABNORMAL LOW (ref 36.0–46.0)
Hemoglobin: 9.1 g/dL — ABNORMAL LOW (ref 12.0–15.0)
MCH: 30.8 pg (ref 26.0–34.0)
MCHC: 29.6 g/dL — ABNORMAL LOW (ref 30.0–36.0)
MCV: 104.1 fL — ABNORMAL HIGH (ref 80.0–100.0)
Platelets: 258 10*3/uL (ref 150–400)
RBC: 2.95 MIL/uL — ABNORMAL LOW (ref 3.87–5.11)
RDW: 14.6 % (ref 11.5–15.5)
WBC: 8.6 10*3/uL (ref 4.0–10.5)
nRBC: 0 % (ref 0.0–0.2)

## 2021-07-29 LAB — BASIC METABOLIC PANEL
Anion gap: 8 (ref 5–15)
BUN: 6 mg/dL — ABNORMAL LOW (ref 8–23)
CO2: 23 mmol/L (ref 22–32)
Calcium: 8.6 mg/dL — ABNORMAL LOW (ref 8.9–10.3)
Chloride: 113 mmol/L — ABNORMAL HIGH (ref 98–111)
Creatinine, Ser: 0.41 mg/dL — ABNORMAL LOW (ref 0.44–1.00)
GFR, Estimated: >60 mL/min (ref >60)
Glucose, Bld: 119 mg/dL — ABNORMAL HIGH (ref 70–99)
Potassium: 2.9 mmol/L — ABNORMAL LOW (ref 3.5–5.1)
Sodium: 144 mmol/L (ref 135–145)

## 2021-07-29 LAB — MAGNESIUM: Magnesium: 1.8 mg/dL (ref 1.7–2.4)

## 2021-07-29 MED ORDER — NOREPINEPHRINE 4 MG/250ML-% IV SOLN
0.0000 ug/min | INTRAVENOUS | Status: AC
  Administered 2021-07-29: 4 ug/min via INTRAVENOUS

## 2021-07-29 MED ORDER — MIDODRINE HCL 5 MG PO TABS
15.0000 mg | ORAL_TABLET | Freq: Three times a day (TID) | ORAL | Status: DC
Start: 2021-07-29 — End: 2021-08-04
  Administered 2021-07-29 – 2021-08-03 (×13): 15 mg via ORAL
  Filled 2021-07-29 (×14): qty 3

## 2021-07-29 MED ORDER — POTASSIUM CHLORIDE 10 MEQ/100ML IV SOLN
10.0000 meq | INTRAVENOUS | Status: AC
  Administered 2021-07-29 (×5): 10 meq via INTRAVENOUS
  Filled 2021-07-29 (×5): qty 100

## 2021-07-29 MED ORDER — MAGNESIUM SULFATE 4 GM/100ML IV SOLN
4.0000 g | Freq: Once | INTRAVENOUS | Status: AC
Start: 2021-07-29 — End: 2021-07-29
  Administered 2021-07-29: 4 g via INTRAVENOUS
  Filled 2021-07-29: qty 100

## 2021-07-29 MED ORDER — NOREPINEPHRINE 4 MG/250ML-% IV SOLN
0.0000 ug/min | INTRAVENOUS | Status: AC
  Administered 2021-07-29: 2 ug/min via INTRAVENOUS
  Filled 2021-07-29: qty 250

## 2021-07-29 NOTE — Assessment & Plan Note (Addendum)
-   X-rays done 07/28/2021 with no evidence of osteomyelitis ?-Continue protective foam and ask for WOC to evaluate with video equipment.  ?

## 2021-07-29 NOTE — Progress Notes (Signed)
?PROGRESS NOTE ? ? ?[name]  WNU:[id number] DOB: 11/07/1955 DOA: 07/27/2021 ?PCP: Pcp, No  ? ?Chief Complaint  ?Patient presents with  ? Altered Mental Status  ? ?Level of care: ICU ? ?Brief Admission History:  ?66 y.o. Caucasian female with medical history significant for lung cancer status post right upper lobectomy, GERD, dyslipidemia bipolar disorder and major epression and drug abuse, who presented to the ER with acute onset of altered mental status.  The patient has been altered and hollering for the last 4 hours before coming to the ER.  EMS reported that she was soaked in urine at home and was having foul smell of her urine.  She has a known history of schizophrenia and therefore was a very poor historian.  She was slightly agitated and restless in the ER.  No reported nausea or vomiting.  No other history could be obtained. ? ?ED Course: Upon presentation to the emergency room, BP was 148/98 with a heart rate of 115 and respiratory rate of 26.  Temperature was 99.7.  Labs revealed glucose of 143 and albumin 3.4 with total bili of 1.3 and otherwise unremarkable CMP.  Lactic acid was 2.7.  CBC showed leukocytosis of 12.9 with neutrophilia and thrombocytosis of 401 showed 21-56.  30 protein and small leukocytes.  Alcohol was less than 10 and urine drug screen was negative.  Stopped ?EKG as reviewed by me : Initial EKG showed sinus tachycardia with a rate of 111 with nonspecific intraventricular conduction delay and borderline repolarization abnormality was suboptimal quality.  Repeat EKG showed sinus tachycardia with rate 128 with low voltage QRS. ?Imaging: Portable chest ray showed no acute cardiopulmonary disease. ? ?The patient was given IV vancomycin and cefepime, midodrine, Tylenol 650 mg p.o. and 2 L bolus of IV normal saline.  She will be admitted to a stepdown unit bed for further evaluation and management. ?  ?07/28/2021:  Pt remains very weak.  She is taking her medication at this time.  She has  a wound on her left ankle and sent for xray.  ? ?07/29/2021: started on IV norepinephrine infusion for persistent low BPs.   ?  ?Assessment and Plan: ?Hypotension ?This has been a longstanding issue since recent hospitalization at [facility]. Cause was never determined but thought secondary to psychiatric medications.  She has been discharged on midodrine. ?She has been briefly placed epinephrine infusion and we are planning to try and wean off as quickly as possible. ? ?Acute metabolic encephalopathy ?- This likely secondary to her sepsis and UTI. ?- Management as above and follow mental status. ?-Mentation seems to be improving to baseline. ? ?Severe sepsis ([place]) ?- This is manifested by elevated lactic acid of 2.7 and later 3.3 as well as mild hypotension.  Improving with hydration.   ? ? ?Sepsis due to gram-negative UTI ([place]) ?- Sepsis is manifested by fever, tachycardia, tachypnea and leukocytosis. ?- continue stepdown unit bed. ?- We will continue antibiotic therapy with IV Rocephin. ?- The patient will be hydrated with IV normal saline. ?- We will follow lactic acid level. ?- We will follow blood and urine cultures. NGTD ? ?Severe bipolar I disorder, most recent episode depressed ([place]) ?- We will continue lithium and Seroquel as well as Zoloft. ?-Spoke with Pharm.D. and he recommended rechecking lithium level in 2 to 3 days. ? ?Ankle wound, left ?- X-rays done 07/28/2021 with no evidence of osteomyelitis ?-Unstageable wound at this point and continue wound care and will consult the wound care  nursing for further treatment recommendations. ? ?Dyslipidemia ?- We will continue statin therapy. ? ?DVT prophylaxis: enoxaparin ?Code Status: Full  ?Family Communication:  ?Disposition: Status is: Inpatient ?Remains inpatient appropriate because: IV fluid management required ?  ?Consultants:  ? ?Procedures:  ? ?Antimicrobials:  ?  ?Subjective: ?Pt reports that she is feeling better today.  She is eating and drinking better.    ?Objective: ?Vitals:  ? 07/29/21 1215 07/29/21 1222 07/29/21 1230 07/29/21 1300  ?BP: (!) 74/55 (!) 82/40 (!) 95/38 (!) 97/57  ?Pulse: 94 92 93 86  ?Resp: (!) 30 (!) 25 (!) 28 (!) 28  ?Temp:      ?TempSrc:      ?SpO2: 94% 96% 96% 95%  ?Weight:      ?Height:      ? ? ?Intake/Output Summary (Last 24 hours) at 07/29/2021 1459 ?Last data filed at 07/29/2021 1250 ?Gross per 24 hour  ?Intake 3106.71 ml  ?Output 6225 ml  ?Net -3118.29 ml  ? ?Filed Weights  ? 07/27/21 1841 07/28/21 0229 07/29/21 0600  ?Weight: 84.9 kg 69.8 kg 69.8 kg  ? ?Examination: ? ?General exam: Appears calm and comfortable  ?Respiratory system: Clear to auscultation. Respiratory effort normal. ?Cardiovascular system: normal S1 & S2 heard. No JVD, murmurs, rubs, gallops or clicks. No pedal edema. ?Gastrointestinal system: Abdomen is nondistended, soft and nontender. No organomegaly or masses felt. Normal bowel sounds heard. ?Central nervous system: Alert and oriented. No focal neurological deficits. ?Extremities: unstageable left ankle wound. Symmetric 5 x 5 power. ?Skin: No rashes, lesions or ulcers. ?Psychiatry: Judgement and insight appear normal. Mood & affect flat.  ? ?  ?Data Reviewed: I have personally reviewed following labs and imaging studies ? ?CBC: ?Recent Labs  ?Lab 07/27/21 ?1903 07/28/21 ?0356 07/29/21 ?0359  ?WBC 12.9* 8.9 8.6  ?NEUTROABS 10.9*  --   --   ?HGB 13.2 10.0* 9.1*  ?HCT 41.2 33.1* 30.7*  ?MCV 100.5* 103.8* 104.1*  ?PLT 401* 288 258  ? ? ?Basic Metabolic Panel: ?Recent Labs  ?Lab 07/27/21 ?1903 07/28/21 ?0356 07/29/21 ?0359  ?NA 137 140 144  ?K 3.5 3.3* 2.9*  ?CL 100 115* 113*  ?CO2 24 19* 23  ?GLUCOSE 143* 106* 119*  ?BUN 13 14 6*  ?CREATININE 0.59 0.72 0.41*  ?CALCIUM 9.9 7.9* 8.6*  ?MG  --  1.8 1.8  ? ? ?CBG: ?No results for input(s): GLUCAP in the last 168 hours. ? ?Recent Results (from the past 240 hour(s))  ?Resp Panel by RT-PCR (Flu A&B, Covid) Nasopharyngeal Swab     Status: None  ? Collection Time: 07/27/21  8:50 PM   ? Specimen: Nasopharyngeal Swab; Nasopharyngeal(NP) swabs in vial transport medium  ?Result Value Ref Range Status  ? SARS Coronavirus 2 by RT PCR NEGATIVE NEGATIVE Final  ?  Comment: (NOTE) ?SARS-CoV-2 target nucleic acids are NOT DETECTED. ? ?The SARS-CoV-2 RNA is generally detectable in upper respiratory ?specimens during the acute phase of infection. The lowest ?concentration of SARS-CoV-2 viral copies this assay can detect is ?138 copies/mL. A negative result does not preclude SARS-Cov-2 ?infection and should not be used as the sole basis for treatment or ?other patient management decisions. A negative result may occur with  ?improper specimen collection/handling, submission of specimen other ?than nasopharyngeal swab, presence of viral mutation(s) within the ?areas targeted by this assay, and inadequate number of viral ?copies(<138 copies/mL). A negative result must be combined with ?clinical observations, patient history, and epidemiological ?information. The expected result is Negative. ? ?Fact Sheet  for Patients:  ?EntrepreneurPulse.com.au ? ?Fact Sheet for Healthcare Providers:  ?IncredibleEmployment.be ? ?This test is no t yet approved or cleared by the [place] FDA and  ?has been authorized for detection and/or diagnosis of SARS-CoV-2 by ?FDA under an Emergency Use Authorization (EUA). This EUA will remain  ?in effect (meaning this test can be used) for the duration of the ?COVID-19 declaration under Section 564(b)(1) of the Act, 21 ?U.S.C.section 360bbb-3(b)(1), unless the authorization is terminated  ?or revoked sooner.  ? ? ?  ? Influenza A by PCR NEGATIVE NEGATIVE Final  ? Influenza B by PCR NEGATIVE NEGATIVE Final  ?  Comment: (NOTE) ?The Xpert Xpress SARS-CoV-2/FLU/RSV plus assay is intended as an aid ?in the diagnosis of influenza from Nasopharyngeal swab specimens and ?should not be used as a sole basis for treatment. Nasal washings and ?aspirates are  unacceptable for Xpert Xpress SARS-CoV-2/FLU/RSV ?testing. ? ?Fact Sheet for Patients: ?EntrepreneurPulse.com.au ? ?Fact Sheet for Healthcare Providers: ?CubaZine.tn

## 2021-07-29 NOTE — TOC Progression Note (Signed)
Transition of Care (TOC) - Progression Note  ? ? ?Patient Details  ?Name: [name] ?MRN: [id number] ?Date of Birth: 14-Dec-1955 ? ?Transition of Care (TOC) CM/SW Contact  ?Boneta Lucks, RN ?Phone Number: ?07/29/2021, 2:56 PM ? ?Clinical Narrative:    PASSR # received  [id number] E. ? ? ? ?Expected Discharge Plan: [place] ?Barriers to Discharge: Continued Medical Work up ? ?Expected Discharge Plan and Services ?Expected Discharge Plan: [place] ?  ?  ?  ?Living arrangements for the past 2 months: [place] ?                ?  ?

## 2021-07-29 NOTE — TOC Progression Note (Signed)
30 Day note ? ? ?Patient Details  ?Name: [name] ?MRN: [id number] ?Date of Birth: 23-Jul-1955 ? ? ? To whom it May Concern: ?Please be advised that the above name patient will require a short-term nursing home stay- anticipated 30 days or less rehabilitation and strengthening. The plan is for return home.  ?

## 2021-07-29 NOTE — Assessment & Plan Note (Addendum)
This has been a longstanding issue since recent hospitalization at [facility]. Cause was never determined but thought secondary to psychiatric medications.  She had been discharged on low dose midodrine. ?She has been briefly placed norepinephrine infusion during this hospitalization and weaned off.   ?Continue midodrine 10 mg TID as her BP is much improved.   ?Transfered to Med surg. And remained stable ?

## 2021-07-30 DIAGNOSIS — R338 Other retention of urine: Secondary | ICD-10-CM | POA: Diagnosis present

## 2021-07-30 DIAGNOSIS — E870 Hyperosmolality and hypernatremia: Secondary | ICD-10-CM | POA: Diagnosis not present

## 2021-07-30 DIAGNOSIS — E876 Hypokalemia: Secondary | ICD-10-CM | POA: Diagnosis not present

## 2021-07-30 LAB — CBC
HCT: 34.2 % — ABNORMAL LOW (ref 36.0–46.0)
Hemoglobin: 10.1 g/dL — ABNORMAL LOW (ref 12.0–15.0)
MCH: 30.6 pg (ref 26.0–34.0)
MCHC: 29.5 g/dL — ABNORMAL LOW (ref 30.0–36.0)
MCV: 103.6 fL — ABNORMAL HIGH (ref 80.0–100.0)
Platelets: 269 10*3/uL (ref 150–400)
RBC: 3.3 MIL/uL — ABNORMAL LOW (ref 3.87–5.11)
RDW: 14.6 % (ref 11.5–15.5)
WBC: 7.9 10*3/uL (ref 4.0–10.5)
nRBC: 0 % (ref 0.0–0.2)

## 2021-07-30 LAB — BASIC METABOLIC PANEL
Anion gap: 6 (ref 5–15)
BUN: <5 mg/dL — ABNORMAL LOW (ref 8–23)
CO2: 28 mmol/L (ref 22–32)
Calcium: 8.5 mg/dL — ABNORMAL LOW (ref 8.9–10.3)
Chloride: 112 mmol/L — ABNORMAL HIGH (ref 98–111)
Creatinine, Ser: 0.45 mg/dL (ref 0.44–1.00)
GFR, Estimated: >60 mL/min (ref >60)
Glucose, Bld: 126 mg/dL — ABNORMAL HIGH (ref 70–99)
Potassium: 3.3 mmol/L — ABNORMAL LOW (ref 3.5–5.1)
Sodium: 146 mmol/L — ABNORMAL HIGH (ref 135–145)

## 2021-07-30 LAB — URINE CULTURE: Culture: 40000 — AB

## 2021-07-30 LAB — MAGNESIUM: Magnesium: 2.2 mg/dL (ref 1.7–2.4)

## 2021-07-30 MED ORDER — NOREPINEPHRINE 4 MG/250ML-% IV SOLN
INTRAVENOUS | Status: AC
  Filled 2021-07-30: qty 250

## 2021-07-30 MED ORDER — POTASSIUM CHLORIDE 10 MEQ/100ML IV SOLN
10.0000 meq | INTRAVENOUS | Status: AC
  Administered 2021-07-30 (×3): 10 meq via INTRAVENOUS
  Filled 2021-07-30 (×3): qty 100

## 2021-07-30 MED ORDER — NOREPINEPHRINE 4 MG/250ML-% IV SOLN
0.0000 ug/min | INTRAVENOUS | Status: AC
  Administered 2021-07-30: 3 ug/min via INTRAVENOUS

## 2021-07-30 MED ORDER — POTASSIUM CHLORIDE IN NACL 20-0.45 MEQ/L-% IV SOLN
INTRAVENOUS | Status: DC
  Filled 2021-07-30 (×7): qty 1000

## 2021-07-30 NOTE — Assessment & Plan Note (Addendum)
Added free water to IV fluid and this has resolved.  ?

## 2021-07-30 NOTE — Assessment & Plan Note (Addendum)
This has been repleted.  ?

## 2021-07-30 NOTE — Assessment & Plan Note (Addendum)
Pt has been I/O cathed for more than 24 hours and a foley had to be placed.  ?Planning voiding trial 3/19.   PT FAILED VOIDING TRIAL. FOLEY RE-INSERTED 3/19.  ?Follow up with urology outpatient.  ? ?

## 2021-07-30 NOTE — Progress Notes (Addendum)
?PROGRESS NOTE ? ? ?[name]  VZD:[id number] DOB: 1955/11/05 DOA: 07/27/2021 ?PCP: Pcp, No  ? ?Chief Complaint  ?Patient presents with  ? Altered Mental Status  ? ?Level of care: ICU ? ?Brief Admission History:  ?66 y.o. Caucasian female with medical history significant for lung cancer status post right upper lobectomy, GERD, dyslipidemia bipolar disorder and major epression and drug abuse, who presented to the ER with acute onset of altered mental status.  The patient has been altered and hollering for the last 4 hours before coming to the ER.  EMS reported that she was soaked in urine at home and was having foul smell of her urine.  She has a known history of schizophrenia and therefore was a very poor historian.  She was slightly agitated and restless in the ER.  No reported nausea or vomiting.  No other history could be obtained. ? ?ED Course: Upon presentation to the emergency room, BP was 148/98 with a heart rate of 115 and respiratory rate of 26.  Temperature was 99.7.  Labs revealed glucose of 143 and albumin 3.4 with total bili of 1.3 and otherwise unremarkable CMP.  Lactic acid was 2.7.  CBC showed leukocytosis of 12.9 with neutrophilia and thrombocytosis of 401 showed 21-56.  30 protein and small leukocytes.  Alcohol was less than 10 and urine drug screen was negative.  Stopped ?EKG as reviewed by me : Initial EKG showed sinus tachycardia with a rate of 111 with nonspecific intraventricular conduction delay and borderline repolarization abnormality was suboptimal quality.  Repeat EKG showed sinus tachycardia with rate 128 with low voltage QRS. ?Imaging: Portable chest ray showed no acute cardiopulmonary disease. ? ?The patient was given IV vancomycin and cefepime, midodrine, Tylenol 650 mg p.o. and 2 L bolus of IV normal saline.  She will be admitted to a stepdown unit bed for further evaluation and management. ?  ?07/28/2021:  Pt remains very weak.  She is taking her medication at this time.  She has  a wound on her left ankle and sent for xray.  ? ?07/29/2021: started on IV norepinephrine infusion for persistent low BPs.   ? ?07/30/2021:  Mentation improving.  Eating better today.  More alert and oriented today.  Trial off levophed.  ?  ?Assessment and Plan: ?Acute urinary retention ?Pt has been I/O cathed for more than 24 hours and a foley had to be placed.  ?Consider voiding trial 3/19.   If fails would need to discharge with foley in place.  ? ?Hypotension ?This has been a longstanding issue since recent hospitalization at [facility]. Cause was never determined but thought secondary to psychiatric medications.  She has been discharged on midodrine. ?She has been briefly placed epinephrine infusion and we are planning to try and wean off as quickly as possible.  She is off levophed today.  ?Continue midodrine 15 mg TID.   ?Encourage oral intake.  Added free water to IV Fluid.  ? ?Acute metabolic encephalopathy ?- This likely secondary to her sepsis and UTI. ?- Management as above and follow mental status. ?-Mentation seems to be improving to baseline. ? ?Severe sepsis ([place]) ?- This is manifested by elevated lactic acid of 2.7 and later 3.3 as well as mild hypotension.  Improving with hydration.   ?SEPSIS PHYSIOLOGY RESOLVED ? ? ?Sepsis due to gram-negative UTI ([place]) ?- Sepsis is manifested by fever, tachycardia, tachypnea and leukocytosis. ?- continue stepdown unit bed. ?- We will continue antibiotic therapy with IV Rocephin. ?- The  patient will be hydrated with IV normal saline. ?- We will follow lactic acid level. ?- We will follow blood and urine cultures. NGTD ? ?Severe bipolar I disorder, most recent episode depressed ([place]) ?- We will continue lithium and Seroquel as well as Zoloft. ?-Spoke with Pharm.D. and he recommended rechecking lithium level in 2 to 3 days. ? ?Hypernatremia ?Added free water to IV fluid.  ? ?Ankle wound, left ?- X-rays done 07/28/2021 with no evidence of osteomyelitis ?-Continue protective  foam and ask for WOC to evaluate with video equipment.  ? ?Dyslipidemia ?- We will continue statin therapy. ? ?Hypokalemia ?Additional replacement given, recheck in AM. Mg is optimal.  ? ?DVT prophylaxis: enoxaparin ?Code Status: Full  ?Family Communication:  ?Disposition: Status is: Inpatient ?Remains inpatient appropriate because: IV fluid management required ?  ?Consultants:  ? ?Procedures:  ? ?Antimicrobials:  ?  ?Subjective: ?Pt reports that she is feeling better today.  She is eating and drinking better.   ?Objective: ?Vitals:  ? 07/30/21 1130 07/30/21 1200 07/30/21 1230 07/30/21 1300  ?BP: (!) 115/47 133/62 (!) 125/91 132/68  ?Pulse: 93 90 91 88  ?Resp: (!) 21 (!) 22 (!) 23 18  ?Temp:      ?TempSrc:      ?SpO2: 99% 99% 98% 100%  ?Weight:      ?Height:      ? ? ?Intake/Output Summary (Last 24 hours) at 07/30/2021 1412 ?Last data filed at 07/30/2021 1100 ?Gross per 24 hour  ?Intake 2407.95 ml  ?Output 4875 ml  ?Net -2467.05 ml  ? ?Filed Weights  ? 07/28/21 0229 07/29/21 0600 07/30/21 0636  ?Weight: 69.8 kg 69.8 kg 73.8 kg  ? ?Examination: ? ?General exam: Appears calm and comfortable  ?Respiratory system: Clear to auscultation. Respiratory effort normal. ?Cardiovascular system: normal S1 & S2 heard. No JVD, murmurs, rubs, gallops or clicks. No pedal edema. ?Gastrointestinal system: Abdomen is nondistended, soft and nontender. No organomegaly or masses felt. Normal bowel sounds heard. ?Central nervous system: Alert and oriented. No focal neurological deficits. ?Extremities: unstageable left ankle wound. Symmetric 5 x 5 power. ?Skin: left ankle black scab seen, heel with pressure injury but no skin breakdown. ?Psychiatry: Judgement and insight appear normal. Mood & affect flat.  ? ?Pressure Injury 07/30/21 Ankle Left;Medial Unstageable - Full thickness tissue loss in which the base of the injury is covered by slough (yellow, tan, gray, green or brown) and/or eschar (tan, brown or black) in the wound bed. (Active)   ?07/30/21 0935  ?Location: Ankle  ?Location Orientation: Left;Medial  ?Staging: Unstageable - Full thickness tissue loss in which the base of the injury is covered by slough (yellow, tan, gray, green or brown) and/or eschar (tan, brown or black) in the wound bed.  ?Wound Description (Comments):   ?Present on Admission: Yes  ? ?Data Reviewed: I have personally reviewed following labs and imaging studies ? ?CBC: ?Recent Labs  ?Lab 07/27/21 ?1903 07/28/21 ?0356 07/29/21 ?0359 07/30/21 ?0402  ?WBC 12.9* 8.9 8.6 7.9  ?NEUTROABS 10.9*  --   --   --   ?HGB 13.2 10.0* 9.1* 10.1*  ?HCT 41.2 33.1* 30.7* 34.2*  ?MCV 100.5* 103.8* 104.1* 103.6*  ?PLT 401* 288 258 269  ? ? ?Basic Metabolic Panel: ?Recent Labs  ?Lab 07/27/21 ?1903 07/28/21 ?0356 07/29/21 ?0359 07/30/21 ?0402  ?NA 137 140 144 146*  ?K 3.5 3.3* 2.9* 3.3*  ?CL 100 115* 113* 112*  ?CO2 24 19* 23 28  ?GLUCOSE 143* 106* 119* 126*  ?BUN 13  14 6* <5*  ?CREATININE 0.59 0.72 0.41* 0.45  ?CALCIUM 9.9 7.9* 8.6* 8.5*  ?MG  --  1.8 1.8 2.2  ? ? ?CBG: ?No results for input(s): GLUCAP in the last 168 hours. ? ?Recent Results (from the past 240 hour(s))  ?Resp Panel by RT-PCR (Flu A&B, Covid) Nasopharyngeal Swab     Status: None  ? Collection Time: 07/27/21  8:50 PM  ? Specimen: Nasopharyngeal Swab; Nasopharyngeal(NP) swabs in vial transport medium  ?Result Value Ref Range Status  ? SARS Coronavirus 2 by RT PCR NEGATIVE NEGATIVE Final  ?  Comment: (NOTE) ?SARS-CoV-2 target nucleic acids are NOT DETECTED. ? ?The SARS-CoV-2 RNA is generally detectable in upper respiratory ?specimens during the acute phase of infection. The lowest ?concentration of SARS-CoV-2 viral copies this assay can detect is ?138 copies/mL. A negative result does not preclude SARS-Cov-2 ?infection and should not be used as the sole basis for treatment or ?other patient management decisions. A negative result may occur with  ?improper specimen collection/handling, submission of specimen other ?than  nasopharyngeal swab, presence of viral mutation(s) within the ?areas targeted by this assay, and inadequate number of viral ?copies(<138 copies/mL). A negative result must be combined with ?clinical observations, pa

## 2021-07-31 LAB — BASIC METABOLIC PANEL
Anion gap: 7 (ref 5–15)
BUN: 5 mg/dL — ABNORMAL LOW (ref 8–23)
CO2: 24 mmol/L (ref 22–32)
Calcium: 8.7 mg/dL — ABNORMAL LOW (ref 8.9–10.3)
Chloride: 110 mmol/L (ref 98–111)
Creatinine, Ser: 0.46 mg/dL (ref 0.44–1.00)
GFR, Estimated: >60 mL/min (ref >60)
Glucose, Bld: 109 mg/dL — ABNORMAL HIGH (ref 70–99)
Potassium: 4.1 mmol/L (ref 3.5–5.1)
Sodium: 141 mmol/L (ref 135–145)

## 2021-07-31 LAB — MAGNESIUM: Magnesium: 1.9 mg/dL (ref 1.7–2.4)

## 2021-07-31 MED ORDER — FLUCONAZOLE 150 MG PO TABS
150.0000 mg | ORAL_TABLET | Freq: Every day | ORAL | Status: AC
  Administered 2021-07-31: 150 mg via ORAL
  Filled 2021-07-31: qty 1

## 2021-07-31 MED ORDER — SODIUM CHLORIDE 0.9 % IV SOLN
2.0000 g | INTRAVENOUS | Status: AC
  Administered 2021-08-01: 2 g via INTRAVENOUS
  Filled 2021-07-31: qty 20

## 2021-07-31 NOTE — Progress Notes (Signed)
Bladder scanned patient, showed 789 mL. MD made aware, order to insert [name] catheter again as patient failed voiding trial.  ? ?14 Fr [name] Catheter placed, clear, yellow urine returned. Will continue to monitor.  ?

## 2021-07-31 NOTE — Progress Notes (Signed)
[name] catheter removed around 1235. Purewick placed. MD order for bladder scan Q4 hours, if residual is >300 mL, re-insert [name] catheter.  ? ?Will continue to monitor and bladder scan around 1630.  ?

## 2021-07-31 NOTE — Progress Notes (Signed)
?PROGRESS NOTE ? ? ?ATALIE Gonzalez  IOE:[id number] DOB: 07/23/1955 DOA: 07/27/2021 ?PCP: Pcp, No  ? ?Chief Complaint  ?Patient presents with  ? Altered Mental Status  ? ?Level of care: Telemetry ? ?Brief Admission History:  ?66 y.o. Caucasian female with medical history significant for lung cancer status post right upper lobectomy, GERD, dyslipidemia bipolar disorder and major epression and drug abuse, who presented to the ER with acute onset of altered mental status.  The patient has been altered and hollering for the last 4 hours before coming to the ER.  EMS reported that she was soaked in urine at home and was having foul smell of her urine.  She has a known history of schizophrenia and therefore was a very poor historian.  She was slightly agitated and restless in the ER.  No reported nausea or vomiting.  No other history could be obtained. ? ?ED Course: Upon presentation to the emergency room, BP was 148/98 with a heart rate of 115 and respiratory rate of 26.  Temperature was 99.7.  Labs revealed glucose of 143 and albumin 3.4 with total bili of 1.3 and otherwise unremarkable CMP.  Lactic acid was 2.7.  CBC showed leukocytosis of 12.9 with neutrophilia and thrombocytosis of 401 showed 21-56.  30 protein and small leukocytes.  Alcohol was less than 10 and urine drug screen was negative.  Stopped ?EKG as reviewed by me : Initial EKG showed sinus tachycardia with a rate of 111 with nonspecific intraventricular conduction delay and borderline repolarization abnormality was suboptimal quality.  Repeat EKG showed sinus tachycardia with rate 128 with low voltage QRS. ?Imaging: Portable chest ray showed no acute cardiopulmonary disease. ? ?The patient was given IV vancomycin and cefepime, midodrine, Tylenol 650 mg p.o. and 2 L bolus of IV normal saline.  She will be admitted to a stepdown unit bed for further evaluation and management. ?  ?07/28/2021:  Pt remains very weak.  She is taking her medication at this time.   She has a wound on her left ankle and sent for xray.  ? ?07/29/2021: started on IV norepinephrine infusion for persistent low BPs.   ? ?07/30/2021:  Mentation improving.  Eating better today.  More alert and oriented today.  Trial off levophed.  ? ?07/31/2021: remains off levophed. Awaiting placement.  Midodrine for BP support.  Transfer to telemetry from stepdown ICU.   ? ?  ?Assessment and Plan: ?Acute urinary retention ?Pt has been I/O cathed for more than 24 hours and a foley had to be placed.  ?Planning voiding trial 3/19.   If fails would need to discharge with foley in place.  ? ?Hypotension ?This has been a longstanding issue since recent hospitalization at [facility]. Cause was never determined but thought secondary to psychiatric medications.  She has been discharged on midodrine. ?She has been briefly placed epinephrine infusion and we are planning to try and wean off as quickly as possible.  She is off levophed.  ?Continue midodrine 15 mg TID.   ?Encourage oral intake.   ? ?Acute metabolic encephalopathy ?- This likely secondary to her sepsis and UTI. ?- Management as above and follow mental status. ?-Mentation seems to be improved to baseline. ? ?Severe sepsis ([place]) ?- This is manifested by elevated lactic acid of 2.7 and later 3.3 as well as mild hypotension.  Improving with hydration.   ?SEPSIS PHYSIOLOGY RESOLVED ? ? ?Sepsis due to gram-negative UTI ([place]) ?- Sepsis is manifested by fever, tachycardia, tachypnea and leukocytosis. ?-  continue stepdown unit bed. ?- We will continue antibiotic therapy with IV Rocephin. ?- The patient will be hydrated with IV normal saline. ?- We will follow lactic acid level. ?- We will follow blood and urine cultures. NGTD ? ?Severe bipolar I disorder, most recent episode depressed ([place]) ?- We will continue lithium and Seroquel as well as Zoloft. ?-Spoke with Pharm.D. and he recommended rechecking lithium level in 2 to 3 days.  Will check in AM.  ? ?Hypernatremia ?Added free  water to IV fluid and this has resolved.  ? ?Ankle wound, left ?- X-rays done 07/28/2021 with no evidence of osteomyelitis ?-Continue protective foam and ask for WOC to evaluate with video equipment.  ? ?Dyslipidemia ?- We will continue statin therapy. ? ?Hypokalemia ?This has been repleted.  ? ?DVT prophylaxis: enoxaparin ?Code Status: Full  ?Family Communication:  ?Disposition: Status is: Inpatient ?Remains inpatient appropriate because: IV fluid management required ?  ?Consultants:  ? ?Procedures:  ? ?Antimicrobials:  ?  ?Subjective: ?Pt is having better sleep.  She is more alert and oriented and eating better. She can't get up and walk and severely debilitated.  She is willing to work with PT.    ?Objective: ?Vitals:  ? 07/31/21 0900 07/31/21 1000 07/31/21 1100 07/31/21 1126  ?BP: 127/63 134/71 (!) 140/37   ?Pulse: 94 91 84   ?Resp: (!) 22 19 (!) 23   ?Temp:    (!) 97.3 ?F (36.3 ?C)  ?TempSrc:    Axillary  ?SpO2: 96% 98% 98%   ?Weight:      ?Height:      ? ? ?Intake/Output Summary (Last 24 hours) at 07/31/2021 1215 ?Last data filed at 07/31/2021 1100 ?Gross per 24 hour  ?Intake 2746.95 ml  ?Output 3665 ml  ?Net -918.05 ml  ? ?Filed Weights  ? 07/29/21 0600 07/30/21 0636 07/31/21 0524  ?Weight: 69.8 kg 73.8 kg 70.9 kg  ? ?Examination: ? ?General exam: Appears calm and comfortable  ?Respiratory system: Clear to auscultation. Respiratory effort normal. ?Cardiovascular system: normal S1 & S2 heard. No JVD, murmurs, rubs, gallops or clicks. No pedal edema. ?Gastrointestinal system: Abdomen is nondistended, soft and nontender. No organomegaly or masses felt. Normal bowel sounds heard. ?Central nervous system: Alert and oriented. No focal neurological deficits. ?Extremities: unstageable left ankle wound. Symmetric 5 x 5 power. ?Skin: left ankle black scab seen, heel with pressure injury but no skin breakdown. ?Psychiatry: Judgement and insight appear normal. Mood & affect flat.  ? ?Pressure Injury 07/30/21 Ankle  Left;Medial Unstageable - Full thickness tissue loss in which the base of the injury is covered by slough (yellow, tan, gray, green or brown) and/or eschar (tan, brown or black) in the wound bed. (Active)  ?07/30/21 0935  ?Location: Ankle  ?Location Orientation: Left;Medial  ?Staging: Unstageable - Full thickness tissue loss in which the base of the injury is covered by slough (yellow, tan, gray, green or brown) and/or eschar (tan, brown or black) in the wound bed.  ?Wound Description (Comments):   ?Present on Admission: Yes  ? ?Data Reviewed: I have personally reviewed following labs and imaging studies ? ?CBC: ?Recent Labs  ?Lab 07/27/21 ?1903 07/28/21 ?0356 07/29/21 ?0359 07/30/21 ?0402  ?WBC 12.9* 8.9 8.6 7.9  ?NEUTROABS 10.9*  --   --   --   ?HGB 13.2 10.0* 9.1* 10.1*  ?HCT 41.2 33.1* 30.7* 34.2*  ?MCV 100.5* 103.8* 104.1* 103.6*  ?PLT 401* 288 258 269  ? ? ?Basic Metabolic Panel: ?Recent Labs  ?Lab 07/27/21 ?  1903 07/28/21 ?0158 07/29/21 ?6825 07/30/21 ?0402 07/31/21 ?7493  ?NA 137 140 144 146* 141  ?K 3.5 3.3* 2.9* 3.3* 4.1  ?CL 100 115* 113* 112* 110  ?CO2 24 19* 23 28 24   ?GLUCOSE 143* 106* 119* 126* 109*  ?BUN 13 14 6* <5* 5*  ?CREATININE 0.59 0.72 0.41* 0.45 0.46  ?CALCIUM 9.9 7.9* 8.6* 8.5* 8.7*  ?MG  --  1.8 1.8 2.2 1.9  ? ? ?CBG: ?No results for input(s): GLUCAP in the last 168 hours. ? ?Recent Results (from the past 240 hour(s))  ?Resp Panel by RT-PCR (Flu A&B, Covid) Nasopharyngeal Swab     Status: None  ? Collection Time: 07/27/21  8:50 PM  ? Specimen: Nasopharyngeal Swab; Nasopharyngeal(NP) swabs in vial transport medium  ?Result Value Ref Range Status  ? SARS Coronavirus 2 by RT PCR NEGATIVE NEGATIVE Final  ?  Comment: (NOTE) ?SARS-CoV-2 target nucleic acids are NOT DETECTED. ? ?The SARS-CoV-2 RNA is generally detectable in upper respiratory ?specimens during the acute phase of infection. The lowest ?concentration of SARS-CoV-2 viral copies this assay can detect is ?138 copies/mL. A negative result  does not preclude SARS-Cov-2 ?infection and should not be used as the sole basis for treatment or ?other patient management decisions. A negative result may occur with  ?improper specimen collection/handling, submi

## 2021-08-01 LAB — LITHIUM LEVEL: Lithium Lvl: 0.34 mmol/L — ABNORMAL LOW (ref 0.60–1.20)

## 2021-08-01 LAB — GLUCOSE, CAPILLARY: Glucose-Capillary: 116 mg/dL — ABNORMAL HIGH (ref 70–99)

## 2021-08-01 NOTE — TOC Initial Note (Signed)
Transition of Care (TOC) - Initial/Assessment Note  ? ? ?Patient Details  ?Name: [name] ?MRN: [id number] ?Date of Birth: Jul 22, 1955 ? ?Transition of Care (TOC) CM/SW Contact:    ?Boneta Lucks, RN ?Phone Number: ?08/01/2021, 3:27 PM ? ?Clinical Narrative:   Patient moved to 324, now medially ready for discharge. Unable to go home alone. Jeanine with APS is working on Insurance underwriter. She is on her way to get the last signature. Timothy her son ask the that [facility] speak with patient sister, Sheela Stack, number added to the chart.  ?Explained to [facility] we are waiting to get medicaid pending number and [facility] will check with Corporate to make a bed offer with an LOG. Melissa would accept that bed and appreciates any help we can give. Patient receives a check from retirement disability.  TOC to follow.  ? ?APS agrees patient can not discharge home alone. Team updated.  ? ? ?Expected Discharge Plan: [place] ?Barriers to Discharge: Continued Medical Work up ? ? ?Patient Goals and CMS Choice ?Patient states their goals for this hospitalization and ongoing recovery are:: want SNF/ LTC ?CMS Medicare.gov Compare Post Acute Care list provided to:: Patient Represenative (must comment) ?Choice offered to / list presented to : Adult Children ? ?Expected Discharge Plan and Services ?Expected Discharge Plan: [place] ?  ?  ?  ?Living arrangements for the past 2 months: [place] ?                ?  ?  ?  ?  ?  ?  ?  ?  ?  ?  ? ?Prior Living Arrangements/Services ?Living arrangements for the past 2 months: [place] ?Lives with:: Self ?  ?       ?  ?  ?  ?  ? ?Activities of Daily Living ?Home Assistive Devices/Equipment: None ?ADL Screening (condition at time of admission) ?Patient's cognitive ability adequate to safely complete daily activities?: No ?Is the patient deaf or have difficulty hearing?: No ?Does the patient have difficulty seeing, even when wearing  glasses/contacts?: No ?Does the patient have difficulty concentrating, remembering, or making decisions?: Yes ?Patient able to express need for assistance with ADLs?: No ?Does the patient have difficulty dressing or bathing?: Yes ?Independently performs ADLs?: No ?Communication: Needs assistance ?Is this a change from baseline?: Pre-admission baseline ?Dressing (OT): Needs assistance ?Is this a change from baseline?: Pre-admission baseline ?Grooming: Needs assistance ?Is this a change from baseline?: Pre-admission baseline ?Feeding: Needs assistance ?Is this a change from baseline?: Pre-admission baseline ?Bathing: Needs assistance ?Is this a change from baseline?: Pre-admission baseline ?Toileting: Needs assistance ?Is this a change from baseline?: Pre-admission baseline ?In/Out Bed: Needs assistance ?Is this a change from baseline?: Pre-admission baseline ?Walks in Home: Needs assistance ?Is this a change from baseline?: Pre-admission baseline ?Does the patient have difficulty walking or climbing stairs?: Yes ?Weakness of Legs: Both ?Weakness of Arms/Hands: None ? ?Permission Sought/Granted ?  ?  ?   ?   ?   ?   ? ?Emotional Assessment ?  ?  ?  ?Orientation: : Oriented to Self, Oriented to Place ?Alcohol / Substance Use: Not Applicable ?Psych Involvement: No (comment) ? ?Admission diagnosis:  Disorientation [R41.0] ?Sepsis ([place]) [A41.9] ?Sepsis with encephalopathy without septic shock, due to unspecified organism ([place]) [A41.9, R65.20, G93.40] ?Patient Active Problem List  ? Diagnosis Date Noted  ? Hypernatremia 07/30/2021  ? Acute urinary retention 07/30/2021  ? Hypokalemia 07/30/2021  ?  Hypotension 07/29/2021  ? Ankle wound, left 07/29/2021  ? Sepsis due to gram-negative UTI ([place]) 07/28/2021  ? Severe sepsis ([place]) 07/28/2021  ? Dyslipidemia 07/28/2021  ? Acute metabolic encephalopathy 48/47/2072  ? MDD (major depressive disorder), recurrent severe, without psychosis ([place]) 11/14/2017  ? Severe bipolar I disorder,  most recent episode depressed ([place])   ? 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 Lung cancer ([place]) 04/24/2011  ? ?PCP:  Pcp, No ?Pharmacy:   ?[facility], [address], [place] [address] ?Phone: [phone] Fax: [phone] ? ?[place], [place] ?[address] ?[place] [address] ?Phone: [phone] Fax: [phone] ? ? ? ? ?Social Determinants of Health (SDOH) Interventions ?  ? ?Readmission Risk Interventions ?No flowsheet data found. ? ? ?

## 2021-08-01 NOTE — Consult Note (Signed)
WOC Nurse Consult Note: ?Reason for Consult: left lateral malleolus Unstageable PI ?Wound type:Pressure ?Pressure Injury POA: Yes ?Measurement: 1.6cm x 1cm with depth obscured by the presence of nonviable tissue ?Wound CCE:QFDVO soft eschar vs blood blister ?Drainage (amount, consistency, odor) none ?Periwound:intact ?Dressing procedure/placement/frequency: I have provided Nursing with guidance for the care of this lesion using an antimicrobial nonadherent (xeroform) applied daily and used with pressure redistribution heel boots (Prevalon). She is to turn or be reminded to turn from side to side and a sacral silicone prophylactic dressing is in place.  ? ?[place] nursing team will not follow, but will remain available to this patient, the nursing and medical teams.  Please re-consult if needed. ?Thanks, ?Maudie Flakes, MSN, RN, [place], [place], CWON-AP, [place]  ?Pager# [phone]  ? ? ? ?  ?

## 2021-08-01 NOTE — Progress Notes (Signed)
?PROGRESS NOTE ? ? ?[name]  QIH:[id number] DOB: 16-Sep-1955 DOA: 07/27/2021 ?PCP: Pcp, No  ? ?Chief Complaint  ?Patient presents with  ? Altered Mental Status  ? ?Level of care: Med-Surg ? ?Brief Admission History:  ?66 y.o. Caucasian female with medical history significant for lung cancer status post right upper lobectomy, GERD, dyslipidemia bipolar disorder and major epression and drug abuse, who presented to the ER with acute onset of altered mental status.  The patient has been altered and hollering for the last 4 hours before coming to the ER.  EMS reported that she was soaked in urine at home and was having foul smell of her urine.  She has a known history of schizophrenia and therefore was a very poor historian.  She was slightly agitated and restless in the ER.  No reported nausea or vomiting.  No other history could be obtained. ? ?ED Course: Upon presentation to the emergency room, BP was 148/98 with a heart rate of 115 and respiratory rate of 26.  Temperature was 99.7.  Labs revealed glucose of 143 and albumin 3.4 with total bili of 1.3 and otherwise unremarkable CMP.  Lactic acid was 2.7.  CBC showed leukocytosis of 12.9 with neutrophilia and thrombocytosis of 401 showed 21-56.  30 protein and small leukocytes.  Alcohol was less than 10 and urine drug screen was negative.  Stopped ?EKG as reviewed by me : Initial EKG showed sinus tachycardia with a rate of 111 with nonspecific intraventricular conduction delay and borderline repolarization abnormality was suboptimal quality.  Repeat EKG showed sinus tachycardia with rate 128 with low voltage QRS. ?Imaging: Portable chest ray showed no acute cardiopulmonary disease. ? ?The patient was given IV vancomycin and cefepime, midodrine, Tylenol 650 mg p.o. and 2 L bolus of IV normal saline.  She will be admitted to a stepdown unit bed for further evaluation and management. ?  ?07/28/2021:  Pt remains very weak.  She is taking her medication at this time.  She  has a wound on her left ankle and sent for xray.  ? ?07/29/2021: started on IV norepinephrine infusion for persistent low BPs.   ? ?07/30/2021:  Mentation improving.  Eating better today.  More alert and oriented today.  Trial off levophed.  ? ?07/31/2021: remains off levophed. Awaiting placement.  Midodrine for BP support.  Transfer to telemetry from stepdown ICU.  Failed voiding trial and foley had to be re-inserted.   ? ?08/01/2021:  Working with PT. TOC working on disposition.  Medicaid pending.  DC cardiac monitoring.  ?  ?Assessment and Plan: ?Acute urinary retention ?Pt has been I/O cathed for more than 24 hours and a foley had to be placed.  ?Planning voiding trial 3/19.   PT FAILED VOIDING TRIAL. FOLEY RE-INSERTED 3/19.  ?Pt most likely will have to discharge with foley.  Follow up with urology outpatient.  ?Hopefully with improved ambulation she will have better voiding.  ? ?Hypotension ?This has been a longstanding issue since recent hospitalization at [facility]. Cause was never determined but thought secondary to psychiatric medications.  She had been discharged on low dose midodrine. ?She has been briefly placed epinephrine infusion during this hospitalization and weaned off.   ?Continue midodrine 15 mg TID as her BP is much improved.  Transfer to Med surg.    ?Encourage oral intake.   ? ?Acute metabolic encephalopathy ?- Secondary to her sepsis and UTI - TREATED AND RESOLVED ?-Mentation seems to be improved to baseline. ? ?Severe sepsis (  [place]) ?- This is manifested by elevated lactic acid of 2.7 and later 3.3 as well as mild hypotension.  Improving with hydration.   ?SEPSIS PHYSIOLOGY RESOLVED ? ? ?Sepsis due to gram-negative UTI ([place]) ?- Sepsis was manifested by fever, tachycardia, tachypnea and leukocytosis which is now RESOLVED.  ?- transfer to med surg ?- antibiotics completed.  ?- The patient was hydrated with IV normal saline. ? ? ?Severe bipolar I disorder, most recent episode depressed ([place]) ?- We will  continue lithium and Seroquel as well as Zoloft.  Pt NOT having behavioral issues.  ?-rechecked lithium level shows level is a bit low.  Continue current dosing.   ? ?Hypernatremia ?Added free water to IV fluid and this has resolved.  ? ?Ankle wound, left ?- X-rays done 07/28/2021 with no evidence of osteomyelitis ?-Continue protective foam and ask for WOC to evaluate with video equipment.  ? ?Dyslipidemia ?- Continue statin therapy. ? ?Hypokalemia ?This has been repleted.  ? ?DVT prophylaxis: enoxaparin ?Code Status: Full  ?Family Communication:  ?Disposition: Status is: Inpatient ?Remains inpatient appropriate because: IV fluid management required ?  ?Consultants:  ? ?Procedures:  ? ?Antimicrobials:  ?  ?Subjective: ?Pt is having better sleep.  She is more alert and oriented and eating better. She can't get up and walk and severely debilitated.  She is willing to work with PT.    ?Objective: ?Vitals:  ? 08/01/21 0600 08/01/21 0733 08/01/21 0800 08/01/21 1113  ?BP: (!) 119/56  123/77   ?Pulse: 99 95 98   ?Resp: 17 (!) 22 17   ?Temp:  99.4 ?F (37.4 ?C)  97.6 ?F (36.4 ?C)  ?TempSrc:  Oral  Oral  ?SpO2: 94% 95% 96%   ?Weight:      ?Height:      ? ? ?Intake/Output Summary (Last 24 hours) at 08/01/2021 1130 ?Last data filed at 08/01/2021 4580 ?Gross per 24 hour  ?Intake 1922.43 ml  ?Output 4125 ml  ?Net -2202.57 ml  ? ?Filed Weights  ? 07/30/21 0636 07/31/21 0524 08/01/21 0430  ?Weight: 73.8 kg 70.9 kg 68.3 kg  ? ?Examination: ? ?General exam: Appears calm and comfortable  ?Respiratory system: Clear to auscultation. Respiratory effort normal. ?Cardiovascular system: normal S1 & S2 heard. No JVD, murmurs, rubs, gallops or clicks. No pedal edema. ?Gastrointestinal system: Abdomen is nondistended, soft and nontender. No organomegaly or masses felt. Normal bowel sounds heard. ?Central nervous system: Alert and oriented. No focal neurological deficits. ?Extremities: unstageable left ankle wound. Symmetric 5 x 5 power. ?Skin:  left ankle black scab seen, heel with pressure injury but no skin breakdown. ?Psychiatry: Judgement and insight appear normal. Mood & affect flat.  ? ?Pressure Injury 07/30/21 Ankle Left;Medial Unstageable - Full thickness tissue loss in which the base of the injury is covered by slough (yellow, tan, gray, green or brown) and/or eschar (tan, brown or black) in the wound bed. (Active)  ?07/30/21 0935  ?Location: Ankle  ?Location Orientation: Left;Medial  ?Staging: Unstageable - Full thickness tissue loss in which the base of the injury is covered by slough (yellow, tan, gray, green or brown) and/or eschar (tan, brown or black) in the wound bed.  ?Wound Description (Comments):   ?Present on Admission: Yes  ? ?Data Reviewed: I have personally reviewed following labs and imaging studies ? ?CBC: ?Recent Labs  ?Lab 07/27/21 ?1903 07/28/21 ?0356 07/29/21 ?0359 07/30/21 ?0402  ?WBC 12.9* 8.9 8.6 7.9  ?NEUTROABS 10.9*  --   --   --   ?HGB 13.2  10.0* 9.1* 10.1*  ?HCT 41.2 33.1* 30.7* 34.2*  ?MCV 100.5* 103.8* 104.1* 103.6*  ?PLT 401* 288 258 269  ? ? ?Basic Metabolic Panel: ?Recent Labs  ?Lab 07/27/21 ?1903 07/28/21 ?0356 07/29/21 ?0359 07/30/21 ?0402 07/31/21 ?0419  ?NA 137 140 144 146* 141  ?K 3.5 3.3* 2.9* 3.3* 4.1  ?CL 100 115* 113* 112* 110  ?CO2 24 19* 23 28 24   ?GLUCOSE 143* 106* 119* 126* 109*  ?BUN 13 14 6* <5* 5*  ?CREATININE 0.59 0.72 0.41* 0.45 0.46  ?CALCIUM 9.9 7.9* 8.6* 8.5* 8.7*  ?MG  --  1.8 1.8 2.2 1.9  ? ? ?CBG: ?Recent Labs  ?Lab 08/01/21 ?0738  ?GLUCAP 116*  ? ? ?Recent Results (from the past 240 hour(s))  ?Resp Panel by RT-PCR (Flu A&B, Covid) Nasopharyngeal Swab     Status: None  ? Collection Time: 07/27/21  8:50 PM  ? Specimen: Nasopharyngeal Swab; Nasopharyngeal(NP) swabs in vial transport medium  ?Result Value Ref Range Status  ? SARS Coronavirus 2 by RT PCR NEGATIVE NEGATIVE Final  ?  Comment: (NOTE) ?SARS-CoV-2 target nucleic acids are NOT DETECTED. ? ?The SARS-CoV-2 RNA is generally detectable in  upper respiratory ?specimens during the acute phase of infection. The lowest ?concentration of SARS-CoV-2 viral copies this assay can detect is ?138 copies/mL. A negative result does not preclude SARS-Co

## 2021-08-02 LAB — CULTURE, BLOOD (SINGLE)
Culture: NO GROWTH
Special Requests: ADEQUATE

## 2021-08-02 MED ORDER — ALPRAZOLAM 0.5 MG PO TABS
0.5000 mg | ORAL_TABLET | Freq: Three times a day (TID) | ORAL | Status: DC | PRN
  Administered 2021-08-02 – 2021-08-03 (×2): 0.5 mg via ORAL
  Filled 2021-08-02 (×2): qty 1

## 2021-08-02 NOTE — Progress Notes (Signed)
Son at bedside at this time. Assisted in repositioning patient. ?

## 2021-08-02 NOTE — Plan of Care (Signed)

## 2021-08-02 NOTE — Progress Notes (Signed)
?PROGRESS NOTE ? ? ?[name]  FMB:[id number] DOB: 25-Jul-1955 DOA: 07/27/2021 ?PCP: Pcp, No  ? ?Chief Complaint  ?Patient presents with  ? Altered Mental Status  ? ?Level of care: Med-Surg ? ?Brief Admission History:  ?66 y.o. Caucasian female with medical history significant for lung cancer status post right upper lobectomy, GERD, dyslipidemia bipolar disorder and major epression and drug abuse, who presented to the ER with acute onset of altered mental status.  The patient has been altered and hollering for the last 4 hours before coming to the ER.  EMS reported that she was soaked in urine at home and was having foul smell of her urine.  She has a known history of schizophrenia and therefore was a very poor historian.  She was slightly agitated and restless in the ER.  No reported nausea or vomiting.  No other history could be obtained. ? ?ED Course: Upon presentation to the emergency room, BP was 148/98 with a heart rate of 115 and respiratory rate of 26.  Temperature was 99.7.  Labs revealed glucose of 143 and albumin 3.4 with total bili of 1.3 and otherwise unremarkable CMP.  Lactic acid was 2.7.  CBC showed leukocytosis of 12.9 with neutrophilia and thrombocytosis of 401 showed 21-56.  30 protein and small leukocytes.  Alcohol was less than 10 and urine drug screen was negative.  Stopped ?EKG as reviewed by me : Initial EKG showed sinus tachycardia with a rate of 111 with nonspecific intraventricular conduction delay and borderline repolarization abnormality was suboptimal quality.  Repeat EKG showed sinus tachycardia with rate 128 with low voltage QRS. ?Imaging: Portable chest ray showed no acute cardiopulmonary disease. ? ?The patient was given IV vancomycin and cefepime, midodrine, Tylenol 650 mg p.o. and 2 L bolus of IV normal saline.  She will be admitted to a stepdown unit bed for further evaluation and management. ?  ?07/28/2021:  Pt remains very weak.  She is taking her medication at this time.  She  has a wound on her left ankle and sent for xray.  ? ?07/29/2021: started on IV norepinephrine infusion for persistent low BPs.   ? ?07/30/2021:  Mentation improving.  Eating better today.  More alert and oriented today.  Trial off levophed.  ? ?07/31/2021: remains off levophed. Awaiting placement.  Midodrine for BP support.  Transfer to telemetry from stepdown ICU.  Failed voiding trial and foley had to be re-inserted.   ? ?08/01/2021:  Working with PT. TOC working on disposition.  Medicaid pending.  DC cardiac monitoring.  ? ?08/02/2021:  Pt medically stable to discharge from hospital but needs placement.  APS involved and cannot go back to home alone.  TOC working on some type of placement.  Continue current management for now.   ?  ?Assessment and Plan: ?Acute urinary retention ?Pt has been I/O cathed for more than 24 hours and a foley had to be placed.  ?Planning voiding trial 3/19.   PT FAILED VOIDING TRIAL. FOLEY RE-INSERTED 3/19.  ?Pt most likely will have to discharge with foley.  Follow up with urology outpatient.  ?Hopefully with improved ambulation she will have better voiding.  ? ?Hypotension ?This has been a longstanding issue since recent hospitalization at [facility]. Cause was never determined but thought secondary to psychiatric medications.  She had been discharged on low dose midodrine. ?She has been briefly placed epinephrine infusion during this hospitalization and weaned off.   ?Continue midodrine 15 mg TID as her BP is much  improved.  Transfer to Med surg.    ?Encourage oral intake.   ? ?Acute metabolic encephalopathy ?- Secondary to her sepsis and UTI - TREATED AND RESOLVED ?-Mentation seems to be improved to baseline. ? ?Severe sepsis ([place]) ?- This is manifested by elevated lactic acid of 2.7 and later 3.3 as well as mild hypotension.  Improving with hydration.   ?SEPSIS PHYSIOLOGY RESOLVED ? ? ?Sepsis due to gram-negative UTI ([place]) ?- Sepsis was manifested by fever, tachycardia, tachypnea and  leukocytosis which is now RESOLVED.  ?- transfer to med surg ?- antibiotics completed.  ?- The patient was hydrated with IV normal saline. ? ? ?Severe bipolar I disorder, most recent episode depressed ([place]) ?- We will continue lithium and Seroquel as well as Zoloft.  Pt NOT having behavioral issues.  ?-rechecked lithium level shows level is a bit low.  Continue current dosing.   ? ?Hypernatremia ?Added free water to IV fluid and this has resolved.  ? ?Ankle wound, left ?- X-rays done 07/28/2021 with no evidence of osteomyelitis ?-Continue protective foam and ask for WOC to evaluate with video equipment.  ? ?Dyslipidemia ?- Continue statin therapy. ? ?Hypokalemia ?This has been repleted.  ? ?DVT prophylaxis: enoxaparin ?Code Status: Full  ?Family Communication:  ?Disposition: Status is: Inpatient ?Remains inpatient appropriate because: IV fluid management required ?  ?Consultants:  ? ?Procedures:  ? ?Antimicrobials:  ?  ?Subjective: ?Pt reports ongoing weakness and reports willingness to try rehabilitation. No agitation or behavioral issues.     ?Objective: ?Vitals:  ? 08/01/21 1717 08/02/21 0021 08/02/21 0155 08/02/21 0421  ?BP: 112/60 (!) 101/59  (!) 109/47  ?Pulse: 95 (!) 107 (!) 104 (!) 104  ?Resp: 18 19  19   ?Temp: 98 ?F (36.7 ?C) (!) 97 ?F (36.1 ?C) 97.7 ?F (36.5 ?C) 98 ?F (36.7 ?C)  ?TempSrc: Oral  Oral   ?SpO2: 99% (!) 87% 95% 91%  ?Weight:      ?Height:      ? ? ?Intake/Output Summary (Last 24 hours) at 08/02/2021 1257 ?Last data filed at 08/02/2021 0749 ?Gross per 24 hour  ?Intake 928.95 ml  ?Output 3250 ml  ?Net -2321.05 ml  ? ?Filed Weights  ? 07/30/21 0636 07/31/21 0524 08/01/21 0430  ?Weight: 73.8 kg 70.9 kg 68.3 kg  ? ?Examination: ? ?General exam: Appears calm and comfortable  ?Respiratory system: Clear to auscultation. Respiratory effort normal. ?Cardiovascular system: normal S1 & S2 heard. No JVD, murmurs, rubs, gallops or clicks. No pedal edema. ?Gastrointestinal system: Abdomen is nondistended, soft  and nontender. No organomegaly or masses felt. Normal bowel sounds heard. ?Central nervous system: Alert and oriented. No focal neurological deficits. ?Extremities: unstageable left ankle wound. Symmetric 5 x 5 power. ?Skin: left ankle black scab seen, heel with pressure injury but no skin breakdown. ?Psychiatry: Judgement and insight appear normal. Mood & affect flat.  ? ?Pressure Injury 07/30/21 Ankle Left;Medial Unstageable - Full thickness tissue loss in which the base of the injury is covered by slough (yellow, tan, gray, green or brown) and/or eschar (tan, brown or black) in the wound bed. (Active)  ?07/30/21 0935  ?Location: Ankle  ?Location Orientation: Left;Medial  ?Staging: Unstageable - Full thickness tissue loss in which the base of the injury is covered by slough (yellow, tan, gray, green or brown) and/or eschar (tan, brown or black) in the wound bed.  ?Wound Description (Comments):   ?Present on Admission: Yes  ? ?Data Reviewed: I have personally reviewed following labs and imaging studies ? ?  CBC: ?Recent Labs  ?Lab 07/27/21 ?1903 07/28/21 ?0356 07/29/21 ?0359 07/30/21 ?0402  ?WBC 12.9* 8.9 8.6 7.9  ?NEUTROABS 10.9*  --   --   --   ?HGB 13.2 10.0* 9.1* 10.1*  ?HCT 41.2 33.1* 30.7* 34.2*  ?MCV 100.5* 103.8* 104.1* 103.6*  ?PLT 401* 288 258 269  ? ? ?Basic Metabolic Panel: ?Recent Labs  ?Lab 07/27/21 ?1903 07/28/21 ?0356 07/29/21 ?0359 07/30/21 ?0402 07/31/21 ?0419  ?NA 137 140 144 146* 141  ?K 3.5 3.3* 2.9* 3.3* 4.1  ?CL 100 115* 113* 112* 110  ?CO2 24 19* 23 28 24   ?GLUCOSE 143* 106* 119* 126* 109*  ?BUN 13 14 6* <5* 5*  ?CREATININE 0.59 0.72 0.41* 0.45 0.46  ?CALCIUM 9.9 7.9* 8.6* 8.5* 8.7*  ?MG  --  1.8 1.8 2.2 1.9  ? ? ?CBG: ?Recent Labs  ?Lab 08/01/21 ?0738  ?GLUCAP 116*  ? ? ?Recent Results (from the past 240 hour(s))  ?Resp Panel by RT-PCR (Flu A&B, Covid) Nasopharyngeal Swab     Status: None  ? Collection Time: 07/27/21  8:50 PM  ? Specimen: Nasopharyngeal Swab; Nasopharyngeal(NP) swabs in vial  transport medium  ?Result Value Ref Range Status  ? SARS Coronavirus 2 by RT PCR NEGATIVE NEGATIVE Final  ?  Comment: (NOTE) ?SARS-CoV-2 target nucleic acids are NOT DETECTED. ? ?The SARS-CoV-2 RNA is general

## 2021-08-02 NOTE — Plan of Care (Signed)
?  Problem: Acute Rehab OT Goals (only OT should resolve) ?Goal: Pt. Will Perform Grooming ?Flowsheets (Taken 08/02/2021 1045) ?Pt Will Perform Grooming: ? with modified independence ? sitting ?Goal: Pt. Will Perform Upper Body Dressing ?Flowsheets (Taken 08/02/2021 1045) ?Pt Will Perform Upper Body Dressing: ? with modified independence ? sitting ?Goal: Pt. Will Perform Lower Body Dressing ?Flowsheets (Taken 08/02/2021 1045) ?Pt Will Perform Lower Body Dressing: ? with mod assist ? sitting/lateral leans ? with adaptive equipment ?Goal: Pt. Will Transfer To Toilet ?Flowsheets (Taken 08/02/2021 1045) ?Pt Will Transfer to Toilet: ? with min assist ? with mod assist ? stand pivot transfer ? bedside commode ?Goal: Pt. Will Perform Toileting-Clothing Manipulation ?Flowsheets (Taken 08/02/2021 1045) ?Pt Will Perform Toileting - Clothing Manipulation and hygiene: ? with mod assist ? sitting/lateral leans ? with adaptive equipment ?Goal: Pt/Caregiver Will Perform Home Exercise Program ?Flowsheets (Taken 08/02/2021 1045) ?Pt/caregiver will Perform Home Exercise Program: ? Increased strength ? Both right and left upper extremity ? Independently ? Everitt Wenner OT, MOT ? ?

## 2021-08-02 NOTE — Progress Notes (Signed)
Patient is complaining of pain, she is very restless, writer has given her available PRNs and they have not assisted in patient's restlessness. She states she feels like she is going to start her period. Then she states her back hurts. Then she states she is having chest pain. Then she states her stomach is eating her up, earlier her legs were hurting, the food was bad, the phone didn't work. Then states her son is coming up here, but she doesn't want him. Then asked me to call her son so he could come up here. Now asking for ice cream.  Very anxious. Have notified Dr. Wynetta Emery with patient's multiple requests.  ?

## 2021-08-02 NOTE — TOC Progression Note (Signed)
Transition of Care (TOC) - Progression Note  ? ? ?Patient Details  ?Name: OTA EBERSOLE ?MRN: [id number] ?Date of Birth: 1956/02/23 ? ?Transition of Care (TOC) CM/SW Contact  ?Boneta Lucks, RN ?Phone Number: ?08/02/2021, 11:49 AM ? ?Clinical Narrative:   APS called to confirm medicaid paperwork is completed. Supervisor approved LOG and sent [employer]. TOC confirmed with Jackelyn Poling that it was received and sent to corporate.  ? ? ?Expected Discharge Plan: [place] ?Barriers to Discharge: Continued Medical Work up ? ?Expected Discharge Plan and Services ?Expected Discharge Plan: [place] ?  ?   ?Living arrangements for the past 2 months: [place] ?                ?  ?

## 2021-08-02 NOTE — Evaluation (Signed)
Occupational Therapy Evaluation ?Patient Details ?Name: [name] ?MRN: [id number] ?DOB: [birth year] ?Today's Date: 08/02/2021 ? ? ?History of Present Illness [name] is a 66 y.o. Caucasian female with medical history significant for lung cancer status post right upper lobectomy, GERD, dyslipidemia bipolar disorder and major epression and drug abuse, who presented to the ER with acute onset of altered mental status.  The patient has been altered and hollering for the last 4 hours before coming to the ER.  EMS reported that she was soaked in urine at home and was having foul smell of her urine.  She has a known history of schizophrenia and therefore was a very poor historian.  She was slightly agitated and restless in the ER.  No reported nausea or vomiting.  No other history could be obtained.  ? ?Clinical Impression ?  ?Pt agreeable to OT evaluation. Pt very weak with fair sitting balance and need for moderate assist overall for bed mobility to sit at EOB and return to supine. Mod to max A needed for sit to stand with pt demonstrating difficulty with L LE mobility to take steps to L side. Returned to sitting due to difficulty. Pt presents with decreased gross and fine motor skills for L UE with general weakness bilaterally. Pt also struggled with superior and inferior visual tracing at midline. Pt is very weak and requires much assistance at this time. Pt will benefit from continued OT in the hospital and recommended venue below to increase strength, balance, and endurance for safe ADL's.  ? ? ?   ? ?Recommendations for follow up therapy are one component of a multi-disciplinary discharge planning process, led by the attending physician.  Recommendations may be updated based on patient status, additional functional criteria and insurance authorization.  ? ?Follow Up Recommendations ? Skilled nursing-short term rehab (<3 hours/day)  ?  ?Assistance Recommended at Discharge Frequent or constant  Supervision/Assistance  ?Patient can return home with the following A lot of help with walking and/or transfers;A lot of help with bathing/dressing/bathroom;Assistance with cooking/housework;Assist for transportation;Help with stairs or ramp for entrance ? ?  ?Functional Status Assessment ? Patient has had a recent decline in their functional status and demonstrates the ability to make significant improvements in function in a reasonable and predictable amount of time.  ?Equipment Recommendations ? None recommended by OT  ?  ?Recommendations for Other Services Other (comment) (Possible optometrist evaluation due to pt deficits in visual tracking today.) ? ? ?  ?Precautions / Restrictions Precautions ?Precautions: Fall ?Restrictions ?Weight Bearing Restrictions: No  ? ?  ? ?Mobility Bed Mobility ?Overal bed mobility: Needs Assistance ?Bed Mobility: Rolling, Supine to Sit, Sit to Supine ?Rolling: Min assist ?  ?Supine to sit: Mod assist ?Sit to supine: Mod assist ?  ?General bed mobility comments: Slow labored movement; use of railing. ?  ? ?Transfers ?Overall transfer level: Needs assistance ?Equipment used: Rolling [place] (2 wheels) ?Transfers: Sit to/from Stand ?Sit to Stand: Mod assist, Max assist ?  ?  ?  ?  ?  ?General transfer comment: Pt unsteady instanding with difficulty lifting L LE to take side steps to L side. ?  ? ?  ?Balance Overall balance assessment: Needs assistance ?Sitting-balance support: Feet supported, No upper extremity supported ?Sitting balance-Leahy Scale: Fair ?Sitting balance - Comments: fair seated at EOB ?  ?Standing balance support: Bilateral upper extremity supported, During functional activity, Reliant on assistive device for balance ?Standing balance-Leahy Scale: Poor ?Standing balance comment: using RW ?  ?  ?  ?  ?  ?  ?  ?  ?  ?  ?  ?   ? ?  ADL either performed or assessed with clinical judgement  ? ?ADL Overall ADL's : Needs assistance/impaired ?  ?  ?Grooming:  Sitting;Supervision/safety ?  ?Upper Body Bathing: Set up;Supervision/ safety;Sitting ?  ?Lower Body Bathing: Maximal assistance;Sitting/lateral leans ?  ?Upper Body Dressing : Supervision/safety;Set up;Sitting ?  ?Lower Body Dressing: Maximal assistance;Bed level ?Lower Body Dressing Details (indicate cue type and reason): Pt attemtped to don socks supine in bed but required assist. ?Toilet Transfer: Maximal assistance;Stand-pivot ?Toilet Transfer Details (indicate cue type and reason): partially simulated via sit to stand from EOB. ?Toileting- Clothing Manipulation and Hygiene: Maximal assistance;Total assistance;Bed level ?  ?  ?  ?  ?General ADL Comments: Pt very weak with decreased coordination of L UE and L LE.  ? ? ? ?Vision Baseline Vision/History: 0 No visual deficits ?Ability to See in Adequate Light: 0 Adequate ?Patient Visual Report: No change from baseline ?Vision Assessment?: Yes ?Eye Alignment: Within Functional Limits ?Tracking/Visual Pursuits: Other (comment) (decreased tracking of superior and inferior fields at midline.)  ?   ?   ?  ?   ?  ? ?Pertinent Vitals/Pain Pain Assessment ?Pain Assessment: 0-10 ?Pain Score: 8  ?Pain Location: bilateral legs and feet ?Pain Descriptors / Indicators: Other (Comment) (excruciating) ?Pain Intervention(s): Limited activity within patient's tolerance, Monitored during session, Repositioned  ? ? ? ?Hand Dominance Right ?  ?Extremity/Trunk Assessment Upper Extremity Assessment ?Upper Extremity Assessment: Generalized weakness;LUE deficits/detail ?LUE Deficits / Details: L hand presents with decreased fine motor coordination and some signs of wasting. Pt demosntrates full mobility but with decreased gross and fine motor coordination. ?LUE Sensation: WNL ?LUE Coordination: decreased fine motor;decreased gross motor ?  ?Lower Extremity Assessment ?Lower Extremity Assessment: Defer to PT evaluation ?  ?Cervical / Trunk Assessment ?Cervical / Trunk Assessment: Normal ?   ?Communication Communication ?Communication: No difficulties ?  ?Cognition Arousal/Alertness: Awake/alert ?Behavior During Therapy: Anxious, Restless ?Overall Cognitive Status: Impaired/Different from baseline ?Area of Impairment: Orientation ?  ?  ?  ?  ?  ?  ?  ?  ?Orientation Level: Person, Place ?  ?  ?  ?  ?  ?  ?General Comments: Pt mildly confused. ?  ?  ?   ? ?  ?   ?  ?    ? ? ?Home Living Family/patient expects to be discharged to:: Private residence ?Living Arrangements: Alone ?Available Help at Discharge: Family;Available PRN/intermittently ?Type of Home: House ?Home Access: Stairs to enter ?Entrance Stairs-Number of Steps: 3 ?Entrance Stairs-Rails: None ?Home Layout: One level ?  ?  ?Bathroom Shower/Tub: Walk-in shower ?  ?Bathroom Toilet: Standard ?Bathroom Accessibility: Yes ?  ?Home Equipment: Conservation officer, nature (2 wheels) ?  ?Additional Comments: taken via PT ntoe ?  ? ?  ?Prior Functioning/Environment Prior Level of Function : Independent/Modified Independent ?  ?  ?  ?  ?  ?  ?Mobility Comments: Community ambulator without AD, drives ?ADLs Comments: Pt reports indepednence with ADL's and IADL's. ?  ? ?  ?  ?OT Problem List: Decreased strength;Decreased range of motion;Decreased activity tolerance;Impaired balance (sitting and/or standing);Impaired vision/perception;Decreased coordination;Decreased cognition;Impaired UE functional use;Pain ?  ?   ?OT Treatment/Interventions: Self-care/ADL training;Therapeutic exercise;DME and/or AE instruction;Therapeutic activities;Cognitive remediation/compensation;Visual/perceptual remediation/compensation;Patient/family education;Balance training  ?  ?OT Goals(Current goals can be found in the care plan section) Acute Rehab OT Goals ?Patient Stated Goal: Pt open to rehab. ?OT Goal Formulation: With patient ?Time For Goal Achievement: 08/16/21 ?Potential to Achieve Goals: Fair  ?OT Frequency: Min 2X/week ?  ? ?   ?  ?  ?  ?  ? ?  ?   ?  ?  ?  ?  ?  ?  ?  ?  End of  Session Equipment Utilized During Treatment: Rolling [place] (2 wheels) ? ?Activity Tolerance: Patient tolerated treatment well ?Patient left: in bed;with call bell/phone within reach;with bed alarm set ? ?OT Visit Diagnosis: Unsteadines

## 2021-08-03 DIAGNOSIS — E876 Hypokalemia: Secondary | ICD-10-CM

## 2021-08-03 DIAGNOSIS — E870 Hyperosmolality and hypernatremia: Secondary | ICD-10-CM

## 2021-08-03 DIAGNOSIS — N390 Urinary tract infection, site not specified: Secondary | ICD-10-CM

## 2021-08-03 DIAGNOSIS — R338 Other retention of urine: Secondary | ICD-10-CM

## 2021-08-03 LAB — CBC
HCT: 38.9 % (ref 36.0–46.0)
Hemoglobin: 12.2 g/dL (ref 12.0–15.0)
MCH: 30.9 pg (ref 26.0–34.0)
MCHC: 31.4 g/dL (ref 30.0–36.0)
MCV: 98.5 fL (ref 80.0–100.0)
Platelets: 311 10*3/uL (ref 150–400)
RBC: 3.95 MIL/uL (ref 3.87–5.11)
RDW: 13.7 % (ref 11.5–15.5)
WBC: 10.3 10*3/uL (ref 4.0–10.5)
nRBC: 0 % (ref 0.0–0.2)

## 2021-08-03 LAB — BASIC METABOLIC PANEL
Anion gap: 10 (ref 5–15)
BUN: 8 mg/dL (ref 8–23)
CO2: 25 mmol/L (ref 22–32)
Calcium: 9.2 mg/dL (ref 8.9–10.3)
Chloride: 99 mmol/L (ref 98–111)
Creatinine, Ser: 0.43 mg/dL — ABNORMAL LOW (ref 0.44–1.00)
GFR, Estimated: >60 mL/min (ref >60)
Glucose, Bld: 151 mg/dL — ABNORMAL HIGH (ref 70–99)
Potassium: 3.6 mmol/L (ref 3.5–5.1)
Sodium: 134 mmol/L — ABNORMAL LOW (ref 135–145)

## 2021-08-03 LAB — MAGNESIUM: Magnesium: 1.9 mg/dL (ref 1.7–2.4)

## 2021-08-03 MED ORDER — FLUCONAZOLE 100 MG PO TABS
200.0000 mg | ORAL_TABLET | Freq: Every day | ORAL | Status: DC
  Administered 2021-08-03: 200 mg via ORAL
  Filled 2021-08-03: qty 2

## 2021-08-03 MED ORDER — MIDODRINE HCL 10 MG PO TABS: 10.0000 mg | ORAL_TABLET | Freq: Three times a day (TID) | ORAL | Status: DC

## 2021-08-03 MED ORDER — CEFDINIR 300 MG PO CAPS
300.0000 mg | ORAL_CAPSULE | Freq: Two times a day (BID) | ORAL | Status: DC
Start: 2021-08-03 — End: 2021-08-04
  Administered 2021-08-03 (×2): 300 mg via ORAL
  Filled 2021-08-03 (×2): qty 1

## 2021-08-03 MED ORDER — FLUCONAZOLE 200 MG PO TABS: 200.0000 mg | ORAL_TABLET | Freq: Every day | ORAL | Status: DC

## 2021-08-03 MED ORDER — CEFDINIR 300 MG PO CAPS: 300.0000 mg | ORAL_CAPSULE | Freq: Two times a day (BID) | ORAL | Status: DC

## 2021-08-03 NOTE — Assessment & Plan Note (Addendum)
Culture = yeast ?UA 21-50 WBC ?Treated with IV ceftriaxone ?D/c with fluconazole x 7 days ? ?

## 2021-08-03 NOTE — Progress Notes (Signed)
Report called to [facility]. ?

## 2021-08-03 NOTE — Progress Notes (Signed)
Patient forgetful, reminded of several encounters today. Has required coaching for medication administration. Patient up in the chair at this time, placed by physical therapy. ?

## 2021-08-03 NOTE — Progress Notes (Signed)
Patient is awaiting pick up by EMS per social worker, packet is complete for d/c and iv is out. ?

## 2021-08-03 NOTE — Progress Notes (Signed)
Nsg Discharge Note ? ?Admit Date:  07/27/2021 ?Discharge date: 08/03/2021 ?  ?[name] to be D/C'd Skilled nursing facility per MD order.  AVS completed. ? ?Report called to [facility]. ? ?Discharge Medication: ?Allergies as of 08/03/2021   ? ?   Reactions  ? Sulfa Antibiotics Swelling, Rash  ? Neck swelling  ? ?  ? ?  ?Medication List  ?  ? ?STOP taking these medications   ? ?albuterol 108 (90 Base) MCG/ACT inhaler ?Commonly known as: VENTOLIN HFA ?  ?atorvastatin 20 MG tablet ?Commonly known as: LIPITOR ?  ? ?  ? ?TAKE these medications   ? ?cefdinir 300 MG capsule ?Commonly known as: OMNICEF ?Take 1 capsule (300 mg total) by mouth every 12 (twelve) hours. X 3 days ?  ?fluconazole 200 MG tablet ?Commonly known as: DIFLUCAN ?Take 1 tablet (200 mg total) by mouth daily. X 7 days ?  ?folic acid 1 MG tablet ?Commonly known as: FOLVITE ?Take 1 mg by mouth daily. ?  ?gabapentin 100 MG capsule ?Commonly known as: NEURONTIN ?Take 200 mg by mouth 3 (three) times daily. ?  ?lithium 300 MG tablet ?Take 300 mg by mouth See admin instructions. The 1 tablet in the morning and 2 tablets every evening. ?  ?MELATONIN PO ?Take 1 tablet by mouth every evening. ?  ?midodrine 10 MG tablet ?Commonly known as: PROAMATINE ?Take 1 tablet (10 mg total) by mouth 3 (three) times daily with meals. ?What changed:  ?medication strength ?how much to take ?when to take this ?  ?QUEtiapine 100 MG tablet ?Commonly known as: SEROQUEL ?Take 100 mg by mouth at bedtime. ?  ?QUEtiapine 25 MG tablet ?Commonly known as: SEROQUEL ?Take 25 mg by mouth in the morning. ?  ? ?  ? ? ?Discharge Assessment: ?Vitals:  ? 08/03/21 0509 08/03/21 1340  ?BP: 110/68 128/75  ?Pulse: (!) 105 89  ?Resp: 18 17  ?Temp: 98 ?F (36.7 ?C) 98.2 ?F (36.8 ?C)  ?SpO2: 95% 97%  ? Skin clean, dry and intact without evidence of skin break down, no evidence of skin tears noted. ?IV catheter discontinued intact. Site without signs and symptoms of complications - no redness or  edema noted at insertion site, patient denies c/o pain - only slight tenderness at site.  Dressing with slight pressure applied. ? ?D/c Instructions-Education: ?Spoken with nursing at [facility]. Son aware of transfer. ? ?  ? ?Alfonse Alpers, RN ?08/03/2021 7:23 PM   ?

## 2021-08-03 NOTE — Tx Team (Signed)
Patient alert and oriented (forgetful at times baseline). Discharged from hospital to facility. IV's taken out, foley in place per MD. No pain or distress. Vital signs stable.  ?

## 2021-08-03 NOTE — TOC Transition Note (Signed)
Transition of Care (TOC) - CM/SW Discharge Note ? ? ?Patient Details  ?Name: [name] ?MRN: [id number] ?Date of Birth: [birth year] ? ?Transition of Care (TOC) CM/SW Contact:  ?Boneta Lucks, RN ?Phone Number: ?08/03/2021, 1:28 PM ? ? ?Clinical Narrative:   Vida Roller from [facility] at [facility] accepted the [place] with Medicaid pending. Anderson Malta with DSS provided a medicaid # [id number] L. DC summary sent in the hub. RN to call report. EMS will be called when [facility] provides bed number. TOC called Tim, her son an updated.  ? ? ?Final next level of care: [place] ?Barriers to Discharge: Barriers Resolved ? ?Patient Goals and CMS Choice ?Patient states their goals for this hospitalization and ongoing recovery are:: want SNF/ LTC ?CMS Medicare.gov Compare Post Acute Care list provided to:: Patient Represenative (must comment) ?Choice offered to / list presented to : Adult Children ? ?Discharge Placement ?  ?  ?  ?Patient to be transferred to facility by: EMS ?Name of family member notified: TIm - Son ?Patient and family notified of of transfer: 08/03/21 ? ?Discharge Plan and Services ?  ?  ?Readmission Risk Interventions ? ?  08/03/2021  ?  1:25 PM  ?Readmission Risk Prevention Plan  ?Transportation Screening Complete  ?PCP or Specialist Appt within 5-7 Days Complete  ?Home Care Screening Complete  ?Medication Review (RN CM) Complete  ? ? ? ? ? ?

## 2021-08-03 NOTE — Progress Notes (Signed)
Physical Therapy Treatment ?Patient Details ?Name: [name] ?MRN: [id number] ?DOB: 1955/11/20 ?Today's Date: 08/03/2021 ? ? ?History of Present Illness [name] is a 66 y.o. Caucasian female with medical history significant for lung cancer status post right upper lobectomy, GERD, dyslipidemia bipolar disorder and major epression and drug abuse, who presented to the ER with acute onset of altered mental status.  The patient has been altered and hollering for the last 4 hours before coming to the ER.  EMS reported that she was soaked in urine at home and was having foul smell of her urine.  She has a known history of schizophrenia and therefore was a very poor historian.  She was slightly agitated and restless in the ER.  No reported nausea or vomiting.  No other history could be obtained. ? ?  ?PT Comments  ? ? Patient demonstrates slow labored movement for sitting up at bedside, fair sitting balance while completing  BLE ROM/strengthening exercises requiring verbal cueing, demonstration and active assistance to assist with moving LLE due to weakness.  Patient very unsteady on feet and limited to a few side steps mostly due to LLE weakness with constant scissoring requiring left foot blocked to avoid loss of balance during transfer to chair.  Patient tolerated sitting up in chair after therapy - nursing staff notified.  Patient will benefit from continued skilled physical therapy in hospital and recommended venue below to increase strength, balance, endurance for safe ADLs and gait.  ? ? ?   ?Recommendations for follow up therapy are one component of a multi-disciplinary discharge planning process, led by the attending physician.  Recommendations may be updated based on patient status, additional functional criteria and insurance authorization. ? ?Follow Up Recommendations ? Skilled nursing-short term rehab (<3 hours/day) ?  ?  ?Assistance Recommended at Discharge Intermittent Supervision/Assistance   ?Patient can return home with the following A lot of help with bathing/dressing/bathroom;A lot of help with walking and/or transfers;Assistance with feeding;Assistance with cooking/housework ?  ?Equipment Recommendations ? None recommended by PT  ?  ?Recommendations for Other Services   ? ? ?  ?Precautions / Restrictions Precautions ?Precautions: Fall ?Restrictions ?Weight Bearing Restrictions: No  ?  ? ?Mobility ? Bed Mobility ?Overal bed mobility: Needs Assistance ?Bed Mobility: Supine to Sit ?  ?  ?Supine to sit: Mod assist ?  ?  ?General bed mobility comments: increased time, labored movement ?  ? ?Transfers ?Overall transfer level: Needs assistance ?Equipment used: Rolling Botts (2 wheels) ?Transfers: Sit to/from Stand, Bed to chair/wheelchair/BSC ?Sit to Stand: Mod assist, Max assist ?  ?Step pivot transfers: Max assist ?  ?  ?  ?General transfer comment: slow labored movement with most diffiuclty moving LLE due to excessive adduction ?  ? ?Ambulation/Gait ?Ambulation/Gait assistance: Max assist ?Gait Distance (Feet): 3 Feet ?Assistive device: Rolling Kurdziel (2 wheels) ?Gait Pattern/deviations: Decreased step length - right, Decreased step length - left, Decreased stance time - left, Knees buckling, Scissoring ?Gait velocity: decreased ?  ?  ?General Gait Details: limited to a few slow labored side steps with frequent scissoring of LLE due to weakness ? ? ?Stairs ?  ?  ?  ?  ?  ? ? ?Wheelchair Mobility ?  ? ?Modified Rankin (Stroke Patients Only) ?  ? ? ?  ?Balance Overall balance assessment: Needs assistance ?Sitting-balance support: Feet supported, No upper extremity supported ?Sitting balance-Leahy Scale: Fair ?Sitting balance - Comments: seated at EOB ?  ?Standing balance support: During functional activity, [place]  on assistive device for balance, Bilateral upper extremity supported ?Standing balance-Leahy Scale: Poor ?Standing balance comment: using RW ?  ?  ?  ?  ?  ?  ?  ?  ?  ?  ?  ?  ? ?   ?Cognition Arousal/Alertness: Awake/alert ?Behavior During Therapy: [facility] for tasks assessed/performed ?Overall Cognitive Status: Within Functional Limits for tasks assessed ?  ?  ?  ?  ?  ?  ?  ?  ?  ?  ?  ?  ?  ?  ?  ?  ?  ?  ?  ? ?  ?Exercises General Exercises - Lower Extremity ?Long Arc Quad: Seated, AROM, Strengthening, Both, 10 reps ?Hip Flexion/Marching: Seated, AROM, Strengthening, Both, 10 reps ?Toe Raises: Seated, AROM, Strengthening, Both, 10 reps ?Heel Raises: Seated, AROM, Strengthening, Both, 10 reps ? ?  ?General Comments   ?  ?  ? ?Pertinent Vitals/Pain    ? ? ?Home Living   ?  ?  ?  ?  ?  ?  ?  ?  ?  ?   ?  ?Prior Function    ?  ?  ?   ? ?PT Goals (current goals can now be found in the care plan section) Acute Rehab PT Goals ?Patient Stated Goal: return home after rehab ?PT Goal Formulation: With patient/family ?Time For Goal Achievement: 08/11/21 ?Potential to Achieve Goals: Good ?Progress towards PT goals: Progressing toward goals ? ?  ?Frequency ? ? ? Min 3X/week ? ? ? ?  ?PT Plan Current plan remains appropriate  ? ? ?Co-evaluation   ?  ?  ?  ?  ? ?  ?AM-PAC PT "6 Clicks" Mobility   ?Outcome Measure ? Help needed turning from your back to your side while in a flat bed without using bedrails?: A Lot ?Help needed moving from lying on your back to sitting on the side of a flat bed without using bedrails?: A Lot ?Help needed moving to and from a bed to a chair (including a wheelchair)?: A Lot ?Help needed standing up from a chair using your arms (e.g., wheelchair or bedside chair)?: A Lot ?Help needed to walk in hospital room?: A Lot ?Help needed climbing 3-5 steps with a railing? : Total ?6 Click Score: 11 ? ?  ?End of Session   ?Activity Tolerance: Patient tolerated treatment well;Patient limited by fatigue ?Patient left: in chair;with call bell/phone within reach;with chair alarm set ?Nurse Communication: Mobility status ?PT Visit Diagnosis: Unsteadiness on feet (R26.81);Other abnormalities of  gait and mobility (R26.89);Muscle weakness (generalized) (M62.81) ?  ? ? ?Time: 3291-9166 ?PT Time Calculation (min) (ACUTE ONLY): 33 min ? ?Charges:  $Therapeutic Exercise: 8-22 mins ?$Therapeutic Activity: 8-22 mins          ?          ? ?3:18 PM, 08/03/21 ?Lonell Grandchild, MPT ?Physical Therapist with [place] ?[facility] ?[phone] office ?[phone] mobile phone ? ? ?

## 2021-08-03 NOTE — Discharge Summary (Signed)
?Physician Discharge Summary ?  ?Patient: [name] MRN: [id number] DOB: 06-25-1955  ?Admit date:     07/27/2021  ?Discharge date: 08/03/21  ?Discharge Physician: Shanon Brow Redell Nazir  ? ?PCP: Pcp, No  ? ?Recommendations at discharge:  ? Please follow up with primary care provider within 1-2 weeks ? Please repeat BMP and CBC in one week ?Please refer patient to urology for voiding trial ? ? ? ? ? ? ?Hospital Course: ?66 y.o. Caucasian female with medical history significant for lung cancer status post right upper lobectomy, GERD, dyslipidemia bipolar disorder and major epression and drug abuse, who presented to the ER with acute onset of altered mental status.  The patient has been altered and hollering for the last 4 hours before coming to the ER.  EMS reported that she was soaked in urine at home and was having foul smell of her urine.  She has a known history of schizophrenia and therefore was a very poor historian.  She was slightly agitated and restless in the ER.  No reported nausea or vomiting.  No other history could be obtained. ? ?ED Course: Upon presentation to the emergency room, BP was 148/98 with a heart rate of 115 and respiratory rate of 26.  Temperature was 99.7.  Labs revealed glucose of 143 and albumin 3.4 with total bili of 1.3 and otherwise unremarkable CMP.  Lactic acid was 2.7.  CBC showed leukocytosis of 12.9 with neutrophilia and thrombocytosis of 401 showed 21-56.  30 protein and small leukocytes.  Alcohol was less than 10 and urine drug screen was negative.  Stopped ?EKG as reviewed by me : Initial EKG showed sinus tachycardia with a rate of 111 with nonspecific intraventricular conduction delay and borderline repolarization abnormality was suboptimal quality.  Repeat EKG showed sinus tachycardia with rate 128 with low voltage QRS. ?Imaging: Portable chest ray showed no acute cardiopulmonary disease. ? ?The patient was given IV vancomycin and cefepime, midodrine, Tylenol 650 mg p.o. and 2 L bolus  of IV normal saline.  She will be admitted to a stepdown unit bed for further evaluation and management. ?  ?07/28/2021:  Pt remains very weak.  She is taking her medication at this time.  She has a wound on her left ankle and sent for xray.  ? ?07/29/2021: started on IV norepinephrine infusion for persistent low BPs.   ? ?07/30/2021:  Mentation improving.  Eating better today.  More alert and oriented today.  Trial off levophed.  ? ?07/31/2021: remains off levophed. Awaiting placement.  Midodrine for BP support.  Transfer to telemetry from stepdown ICU.  Failed voiding trial and foley had to be re-inserted.   ? ?08/01/2021:  Working with PT. TOC working on disposition.  Medicaid pending.  DC cardiac monitoring.  ? ?08/02/2021:  Pt medically stable to discharge from hospital but needs placement.  APS involved and cannot go back to home alone.  TOC working on some type of placement.  Continue current management for now.   ? ?Assessment and Plan: ?* Severe sepsis ([place]) ?- This is manifested by elevated lactic acid of 2.7 and later 3.3 as well as mild hypotension.  Improving with hydration.   ?SEPSIS PHYSIOLOGY RESOLVED ?-due to UTI ? ? ?UTI (urinary tract infection) ?Culture = yeast ?UA 21-50 WBC ?Treated with IV ceftriaxone ?D/c with fluconazole x 7 days ? ? ?Acute metabolic encephalopathy ?- Secondary to her sepsis and UTI - TREATED AND RESOLVED ?-Mentation seems to be improved to baseline. ? ?Dyslipidemia ?-  Continue statin therapy. ? ?Severe bipolar I disorder, most recent episode depressed ([place]) ?- continue lithium and Seroquel as well as Zoloft.  Pt NOT having behavioral issues.  ?-rechecked lithium level shows level-0.34  Continue current dosing.   ? ?Hypokalemia ?This has been repleted.  ? ?Acute urinary retention ?Pt has been I/O cathed for more than 24 hours and a foley had to be placed.  ?Planning voiding trial 3/19.   PT FAILED VOIDING TRIAL. FOLEY RE-INSERTED 3/19.  ?Follow up with urology outpatient.   ? ? ?Hypernatremia ?Added free water to IV fluid and this has resolved.  ? ?Ankle wound, left ?- X-rays done 07/28/2021 with no evidence of osteomyelitis ?-Continue protective foam and ask for WOC to evaluate with video equipment.  ? ?Hypotension ?This has been a longstanding issue since recent hospitalization at [facility]. Cause was never determined but thought secondary to psychiatric medications.  She had been discharged on low dose midodrine. ?She has been briefly placed norepinephrine infusion during this hospitalization and weaned off.   ?Continue midodrine 10 mg TID as her BP is much improved.   ?Transfered to Med surg. And remained stable ? ? ? ? ?  ? ? ?Consultants: none ?Procedures performed: none ?Disposition: Skilled nursing facility ?Diet recommendation:  ?Regular diet ?DISCHARGE MEDICATION: ?Allergies as of 08/03/2021   ? ?   Reactions  ? Sulfa Antibiotics Swelling, Rash  ? Neck swelling  ? ?  ? ?  ?Medication List  ?  ? ?STOP taking these medications   ? ?albuterol 108 (90 Base) MCG/ACT inhaler ?Commonly known as: VENTOLIN HFA ?  ?atorvastatin 20 MG tablet ?Commonly known as: LIPITOR ?  ? ?  ? ?TAKE these medications   ? ?cefdinir 300 MG capsule ?Commonly known as: OMNICEF ?Take 1 capsule (300 mg total) by mouth every 12 (twelve) hours. X 3 days ?  ?fluconazole 200 MG tablet ?Commonly known as: DIFLUCAN ?Take 1 tablet (200 mg total) by mouth daily. X 7 days ?  ?folic acid 1 MG tablet ?Commonly known as: FOLVITE ?Take 1 mg by mouth daily. ?  ?gabapentin 100 MG capsule ?Commonly known as: NEURONTIN ?Take 200 mg by mouth 3 (three) times daily. ?  ?lithium 300 MG tablet ?Take 300 mg by mouth See admin instructions. The 1 tablet in the morning and 2 tablets every evening. ?  ?MELATONIN PO ?Take 1 tablet by mouth every evening. ?  ?midodrine 10 MG tablet ?Commonly known as: PROAMATINE ?Take 1 tablet (10 mg total) by mouth 3 (three) times daily with meals. ?What changed:  ?medication strength ?how much to  take ?when to take this ?  ?QUEtiapine 100 MG tablet ?Commonly known as: SEROQUEL ?Take 100 mg by mouth at bedtime. ?  ?QUEtiapine 25 MG tablet ?Commonly known as: SEROQUEL ?Take 25 mg by mouth in the morning. ?  ? ?  ? ? ?Discharge Exam: ?Filed Weights  ? 07/30/21 0636 07/31/21 0524 08/01/21 0430  ?Weight: 73.8 kg 70.9 kg 68.3 kg  ? ?HEENT:  [place]/AT, No thrush, no icterus ?CV:  RRR, no rub, no S3, no S4 ?Lung:  bibasilar crackles.  No wheeze ?Abd:  soft/+BS, NT ?Ext:  No edema, no lymphangitis, no synovitis, no rash ? ? ?Condition at discharge: stable ? ?The results of significant diagnostics from this hospitalization (including imaging, microbiology, ancillary and laboratory) are listed below for reference.  ? ?Imaging Studies: ?DG Ankle Complete Left ? ?Result Date: 07/28/2021 ?CLINICAL DATA:  Soft tissue wound EXAM: LEFT ANKLE COMPLETE -  3+ VIEW COMPARISON:  None. FINDINGS: No acute fracture or dislocation identified. Postsurgical hardware with metallic plate and screws visualized in the distal fibula, hardware appears intact. Posterior and plantar calcaneal spurs noted. Mild soft tissue swelling of the ankle. IMPRESSION: No acute osseous abnormality visualized.  Mild soft tissue swelling. Electronically Signed   By: Ofilia Neas M.D.   On: 07/28/2021 13:59  ? ?DG Chest Port 1 View ? ?Result Date: 07/27/2021 ?CLINICAL DATA:  Altered level of consciousness, history of right upper lobe lung cancer EXAM: PORTABLE CHEST 1 VIEW COMPARISON:  07/08/2021 FINDINGS: Single frontal view of the chest demonstrates an unremarkable cardiac silhouette. Postsurgical changes identified consistent with prior right upper lobectomy. No acute airspace disease, effusion, or pneumothorax. No acute bony abnormalities. IMPRESSION: 1. No acute intrathoracic process. Electronically Signed   By: Randa Ngo M.D.   On: 07/27/2021 19:14   ? ?Microbiology: ?Results for orders placed or performed during the hospital encounter of 07/27/21   ?Resp Panel by RT-PCR (Flu A&B, Covid) Nasopharyngeal Swab     Status: None  ? Collection Time: 07/27/21  8:50 PM  ? Specimen: Nasopharyngeal Swab; Nasopharyngeal(NP) swabs in vial transport medium  ?Result Value Ref Ra

## 2021-08-15 ENCOUNTER — Encounter (HOSPITAL_COMMUNITY): Payer: Self-pay | Admitting: Emergency Medicine

## 2021-08-15 ENCOUNTER — Other Ambulatory Visit: Payer: Self-pay

## 2021-08-15 ENCOUNTER — Emergency Department (HOSPITAL_COMMUNITY): Payer: Self-pay

## 2021-08-15 ENCOUNTER — Emergency Department (HOSPITAL_COMMUNITY)
Admission: EM | Admit: 2021-08-15 | Discharge: 2021-08-15 | Disposition: A | Discharge status: Home / Self Care | Payer: Self-pay | Attending: Emergency Medicine | Admitting: Emergency Medicine

## 2021-08-15 DIAGNOSIS — E876 Hypokalemia: Secondary | ICD-10-CM | POA: Insufficient documentation

## 2021-08-15 DIAGNOSIS — L89529 Pressure ulcer of left ankle, unspecified stage: Secondary | ICD-10-CM | POA: Insufficient documentation

## 2021-08-15 DIAGNOSIS — L89520 Pressure ulcer of left ankle, unstageable: Secondary | ICD-10-CM

## 2021-08-15 LAB — CBC WITH DIFFERENTIAL/PLATELET
Abs Immature Granulocytes: 0.06 10*3/uL (ref 0.00–0.07)
Basophils Absolute: 0 10*3/uL (ref 0.0–0.1)
Basophils Relative: 0 %
Eosinophils Absolute: 0.1 10*3/uL (ref 0.0–0.5)
Eosinophils Relative: 1 %
HCT: 40.1 % (ref 36.0–46.0)
Hemoglobin: 12.3 g/dL (ref 12.0–15.0)
Immature Granulocytes: 1 %
Lymphocytes Relative: 10 %
Lymphs Abs: 1.2 10*3/uL (ref 0.7–4.0)
MCH: 29.4 pg (ref 26.0–34.0)
MCHC: 30.7 g/dL (ref 30.0–36.0)
MCV: 95.9 fL (ref 80.0–100.0)
Monocytes Absolute: 0.8 10*3/uL (ref 0.1–1.0)
Monocytes Relative: 7 %
Neutro Abs: 9.7 10*3/uL — ABNORMAL HIGH (ref 1.7–7.7)
Neutrophils Relative %: 81 %
Platelets: 273 10*3/uL (ref 150–400)
RBC: 4.18 MIL/uL (ref 3.87–5.11)
RDW: 13.4 % (ref 11.5–15.5)
WBC: 11.9 10*3/uL — ABNORMAL HIGH (ref 4.0–10.5)
nRBC: 0 % (ref 0.0–0.2)

## 2021-08-15 LAB — BASIC METABOLIC PANEL
Anion gap: 9 (ref 5–15)
BUN: 7 mg/dL — ABNORMAL LOW (ref 8–23)
CO2: 28 mmol/L (ref 22–32)
Calcium: 9.2 mg/dL (ref 8.9–10.3)
Chloride: 100 mmol/L (ref 98–111)
Creatinine, Ser: 0.37 mg/dL — ABNORMAL LOW (ref 0.44–1.00)
GFR, Estimated: >60 mL/min (ref >60)
Glucose, Bld: 134 mg/dL — ABNORMAL HIGH (ref 70–99)
Potassium: 3.1 mmol/L — ABNORMAL LOW (ref 3.5–5.1)
Sodium: 137 mmol/L (ref 135–145)

## 2021-08-15 LAB — LACTIC ACID, PLASMA: Lactic Acid, Venous: 1.1 mmol/L (ref 0.5–1.9)

## 2021-08-15 MED ORDER — POTASSIUM CHLORIDE CRYS ER 20 MEQ PO TBCR: 20.0000 meq | EXTENDED_RELEASE_TABLET | Freq: Every day | ORAL | 0 refills | Status: DC

## 2021-08-15 MED ORDER — POTASSIUM CHLORIDE CRYS ER 20 MEQ PO TBCR
40.0000 meq | EXTENDED_RELEASE_TABLET | Freq: Once | ORAL | Status: AC
Start: 2021-08-15 — End: 2021-08-15
  Administered 2021-08-15: 40 meq via ORAL
  Filled 2021-08-15: qty 2

## 2021-08-15 MED ORDER — SODIUM CHLORIDE 0.9 % IV BOLUS
500.0000 mL | Freq: Once | INTRAVENOUS | Status: AC
  Administered 2021-08-15: 500 mL via INTRAVENOUS

## 2021-08-15 NOTE — ED Triage Notes (Signed)
Pt to the ED via EMS from [facility] with complaints of a possible left ankle infection. ?NADN. ? ?

## 2021-08-15 NOTE — Discharge Instructions (Signed)
There is no sign of infection of the wound of the left ankle.  Unfortunately it is exposing an orthopedic appliance previously placed for fracture.  Keep the area clean and change the dressing, every day.  Keep an eye out for infection or other problems.  Use a device to keep pressure off this wound.  See prescription.  Call the orthopedic doctor for follow-up appointment to be seen next week.  This will have to be done in his office.  We are prescribing potassium to treat low potassium level.  Take these pills as prescribed.  Have your doctor recheck your potassium level in 4 to 5 days. ?

## 2021-08-15 NOTE — ED Provider Notes (Signed)
?[facility] ?Provider Note ? ? ?CSN: [id number] ?Arrival date & time: 08/15/21  1550 ? ?  ? ?History ? ?Chief Complaint  ?Patient presents with  ? Ankle Pain  ? ? ?[name] is a 66 y.o. female. ? ?HPI ?Patient presents from her facility for evaluation of an open wound of her left ankle with pain in her left ankle and foot.  We have been treating her with application of bandage for a pressure sore.  She is debilitated, and has been ambulatory for several weeks.  It is unclear why she cannot walk.  She is a poor historian. ?  ? ?Home Medications ?Prior to Admission medications   ?Medication Sig Start Date End Date Taking? Authorizing Provider  ?cyanocobalamin 1000 MCG tablet Take by mouth. 09/06/18  Yes [provider]  ?gabapentin (NEURONTIN) 100 MG capsule Take by mouth. 07/20/21 08/19/21 Yes [provider]  ?Melatonin 10 MG CAPS Take by mouth. 07/20/21 08/19/21 Yes [provider]  ?nicotine polacrilex (NICORETTE) 2 MG gum Take by mouth. 09/05/18  Yes [provider]  ?potassium chloride SA (KLOR-CON M) 20 MEQ tablet Take 1 tablet (20 mEq total) by mouth daily. 08/15/21  Yes Daleen Bo, MD  ?sertraline (ZOLOFT) 50 MG tablet Take 1 tablet by mouth daily. 05/26/15  Yes [provider]  ?thiamine 100 MG tablet Take 1 tablet by mouth daily. 07/21/21 08/20/21 Yes [provider]  ?acetaminophen (TYLENOL) 500 MG tablet Take by mouth.    [provider]  ?cefdinir (OMNICEF) 300 MG capsule Take 1 capsule (300 mg total) by mouth every 12 (twelve) hours. X 3 days 08/03/21   Orson Eva, MD  ?fluconazole (DIFLUCAN) 200 MG tablet Take 1 tablet (200 mg total) by mouth daily. X 7 days 08/03/21   Orson Eva, MD  ?folic acid (FOLVITE) 1 MG tablet Take 1 mg by mouth daily.    [provider]  ?gabapentin (NEURONTIN) 100 MG capsule Take 200 mg by mouth 3 (three) times daily. 07/20/21   [provider]  ?ivermectin (STROMECTOL) 3 MG TABS tablet  SMARTSIG:4.5 Tablet(s) By Mouth Once 05/30/21   [provider]  ?lithium 300 MG tablet Take 300 mg by mouth See admin instructions. The 1 tablet in the morning and 2 tablets every evening. 07/20/21   [provider]  ?MELATONIN PO Take 1 tablet by mouth every evening.    [provider]  ?midodrine (PROAMATINE) 10 MG tablet Take 1 tablet (10 mg total) by mouth 3 (three) times daily with meals. 08/03/21   Orson Eva, MD  ?QUEtiapine (SEROQUEL) 100 MG tablet Take 100 mg by mouth at bedtime. 07/20/21   [provider]  ?QUEtiapine (SEROQUEL) 25 MG tablet Take 25 mg by mouth in the morning. 07/21/21 08/20/21  [provider]  ?Study - CAPTIVA - aspirin 81 mg tablet (PI-Sethi) Chew by mouth.    [provider]  ?   ? ?Allergies    ?Sulfa antibiotics   ? ?Review of Systems   ?Review of Systems ? ?Physical Exam ?Updated Vital Signs ?BP 100/61 (BP Location: Left Arm)   Pulse (!) 119   Temp 98.7 ?F (37.1 ?C) (Oral)   Resp 14   Ht 5\' 4"  (1.626 m)   Wt 68.3 kg   SpO2 93%   BMI 25.85 kg/m?  ?Physical Exam ?Vitals and nursing note reviewed.  ?Constitutional:   ?   General: She is not in acute distress. ?   Appearance: She  is well-developed. She is not ill-appearing or diaphoretic.  ?HENT:  ?   Head: Normocephalic and atraumatic.  ?   Right Ear: External ear normal.  ?   Left Ear: External ear normal.  ?Eyes:  ?   Conjunctiva/sclera: Conjunctivae normal.  ?   Pupils: Pupils are equal, round, and reactive to light.  ?Neck:  ?   Trachea: Phonation normal.  ?Cardiovascular:  ?   Rate and Rhythm: Normal rate.  ?Pulmonary:  ?   Effort: Pulmonary effort is normal.  ?Abdominal:  ?   General: There is no distension.  ?Musculoskeletal:     ?   General: Normal range of motion.  ?   Cervical back: Normal range of motion and neck supple.  ?   Comments: Mild tenderness left foot and ankle, nonspecified location.  There is a wound over the left lateral malleolus, which is open, exposing  intact orthopedic hardware.  This area is actually not tender.  There is no associated drainage, bleeding or swelling.  ?Skin: ?   General: Skin is warm and dry.  ?Neurological:  ?   Mental Status: She is alert and oriented to person, place, and time.  ?   Cranial Nerves: No cranial nerve deficit.  ?   Sensory: No sensory deficit.  ?   Motor: No abnormal muscle tone.  ?   Coordination: Coordination normal.  ?Psychiatric:     ?   Mood and Affect: Mood normal.     ?   Behavior: Behavior normal.     ?   Thought Content: Thought content normal.     ?   Judgment: Judgment normal.  ? ? ?ED Results / Procedures / Treatments   ?Labs ?(all labs ordered are listed, but only abnormal results are displayed) ?Labs Reviewed  ?BASIC METABOLIC PANEL - Abnormal; Notable for the following components:  ?    Result Value  ? Potassium 3.1 (*)   ? Glucose, Bld 134 (*)   ? BUN 7 (*)   ? Creatinine, Ser 0.37 (*)   ? All other components within normal limits  ?CBC WITH DIFFERENTIAL/PLATELET - Abnormal; Notable for the following components:  ? WBC 11.9 (*)   ? Neutro Abs 9.7 (*)   ? All other components within normal limits  ?CULTURE, BLOOD (ROUTINE X 2)  ?CULTURE, BLOOD (ROUTINE X 2)  ?LACTIC ACID, PLASMA  ? ? ?EKG ?None ? ?Radiology ?DG Ankle Complete Left ? ?Result Date: 08/15/2021 ?CLINICAL DATA:  Pain left ankle EXAM: LEFT ANKLE COMPLETE - 3+ VIEW COMPARISON:  07/28/2021 FINDINGS: There is previous internal fixation with metallic sideplate and multiple screws in the distal left fibula. No recent fracture or dislocation is seen. There are no focal lytic lesions. Small plantar spur is seen in the calcaneus. There are calcifications at the attachment of Achilles tendon to the calcaneus suggesting possible calcific tendinosis. Overall, no significant interval changes are noted since 07/28/2021. IMPRESSION: No recent fracture or dislocation is seen in the left ankle. Other findings as described in the body of the report. Electronically Signed    By: Elmer Picker M.D.   On: 08/15/2021 17:21   ? ?Procedures ?Procedures  ? ? ?Medications Ordered in ED ?Medications  ?sodium chloride 0.9 % bolus 500 mL (0 mLs Intravenous Stopped 08/15/21 2019)  ?potassium chloride SA (KLOR-CON M) CR tablet 40 mEq (40 mEq Oral Given 08/15/21 2019)  ? ? ?ED Course/ Medical Decision Making/ A&P ?  ?                        ?  Medical Decision Making ?Patient presenting with open wound exposing surgical hardware, from pressure sore due to inactivity.  She is apparently nonambulatory. ? ?Problems Addressed: ?Hypokalemia: acute illness or injury ?   Details: Incidental finding, etiology not clear ?Pressure sore on ankle, left, unstageable ([place]): acute illness or injury ?   Details: Secondary to inactivity and bedbound status ? ?Amount and/or Complexity of Data Reviewed ?Independent Historian: EMS ?   Details: And data from nursing home. ?Labs: ordered. ?   Details: CBC, metabolic panel-normal except potassium low, white count high ?Radiology: ordered and independent interpretation performed. ?   Details: X-ray left ankle-intact orthopedic compliance, no fracture or evidence for osteomyelitis ?Discussion of management or test interpretation with external provider(s): Case discussed with orthopedics, Dr. Amedeo Kinsman; he states that if patient is not acutely infected, he can follow-up and treat her from the office, next week. ? ?Risk ?Prescription drug management. ?Decision regarding hospitalization. ?Risk Details: Patient presenting with open wound, complicated by presence of a surgical appliance at the base of the wound.  Pressure sore is small but likely needs to be managed with pressure day loading, wound management and consideration for wound closure surgically.  Doubt sepsis.  Doubt osteomyelitis.  No evidence for surgical appliance breakdown.  She does not have systemic symptoms.  She can be managed as an outpatient with orthopedic follow-up as planned.  Incidental hypokalemia noted  and will be treated as an outpatient with 3 doses of potassium.  First dose started in the ED.  Recommend follow-up potassium level later this week. ? ? ? ? ? ? ? ? ? ? ?Final Clinical Impression(s) / ED Diagnoses

## 2021-08-17 ENCOUNTER — Emergency Department (HOSPITAL_COMMUNITY): Payer: Self-pay

## 2021-08-17 ENCOUNTER — Encounter (HOSPITAL_COMMUNITY): Payer: Self-pay

## 2021-08-17 ENCOUNTER — Other Ambulatory Visit: Payer: Self-pay

## 2021-08-17 ENCOUNTER — Emergency Department (HOSPITAL_COMMUNITY)
Admission: EM | Admit: 2021-08-17 | Discharge: 2021-08-18 | Disposition: A | Discharge status: Skilled Nursing Facility | Payer: Self-pay | Attending: Emergency Medicine | Admitting: Emergency Medicine

## 2021-08-17 DIAGNOSIS — Z79899 Other long term (current) drug therapy: Secondary | ICD-10-CM | POA: Insufficient documentation

## 2021-08-17 DIAGNOSIS — R410 Disorientation, unspecified: Secondary | ICD-10-CM | POA: Insufficient documentation

## 2021-08-17 DIAGNOSIS — Z85118 Personal history of other malignant neoplasm of bronchus and lung: Secondary | ICD-10-CM | POA: Insufficient documentation

## 2021-08-17 DIAGNOSIS — N390 Urinary tract infection, site not specified: Secondary | ICD-10-CM | POA: Insufficient documentation

## 2021-08-17 DIAGNOSIS — T83091D Other mechanical complication of indwelling urethral catheter, subsequent encounter: Secondary | ICD-10-CM | POA: Insufficient documentation

## 2021-08-17 DIAGNOSIS — R451 Restlessness and agitation: Secondary | ICD-10-CM | POA: Insufficient documentation

## 2021-08-17 DIAGNOSIS — E870 Hyperosmolality and hypernatremia: Secondary | ICD-10-CM | POA: Insufficient documentation

## 2021-08-17 DIAGNOSIS — X58XXXA Exposure to other specified factors, initial encounter: Secondary | ICD-10-CM | POA: Insufficient documentation

## 2021-08-17 DIAGNOSIS — D72829 Elevated white blood cell count, unspecified: Secondary | ICD-10-CM | POA: Insufficient documentation

## 2021-08-17 LAB — CBC WITH DIFFERENTIAL/PLATELET
Abs Immature Granulocytes: 0.06 10*3/uL (ref 0.00–0.07)
Basophils Absolute: 0 10*3/uL (ref 0.0–0.1)
Basophils Relative: 0 %
Eosinophils Absolute: 0.1 10*3/uL (ref 0.0–0.5)
Eosinophils Relative: 1 %
HCT: 37.3 % (ref 36.0–46.0)
Hemoglobin: 11.4 g/dL — ABNORMAL LOW (ref 12.0–15.0)
Immature Granulocytes: 1 %
Lymphocytes Relative: 16 %
Lymphs Abs: 1.6 10*3/uL (ref 0.7–4.0)
MCH: 29.3 pg (ref 26.0–34.0)
MCHC: 30.6 g/dL (ref 30.0–36.0)
MCV: 95.9 fL (ref 80.0–100.0)
Monocytes Absolute: 0.9 10*3/uL (ref 0.1–1.0)
Monocytes Relative: 9 %
Neutro Abs: 7.3 10*3/uL (ref 1.7–7.7)
Neutrophils Relative %: 73 %
Platelets: 295 10*3/uL (ref 150–400)
RBC: 3.89 MIL/uL (ref 3.87–5.11)
RDW: 13.9 % (ref 11.5–15.5)
WBC: 10 10*3/uL (ref 4.0–10.5)
nRBC: 0 % (ref 0.0–0.2)

## 2021-08-17 LAB — COMPREHENSIVE METABOLIC PANEL
ALT: 33 U/L (ref 0–44)
AST: 17 U/L (ref 15–41)
Albumin: 2.6 g/dL — ABNORMAL LOW (ref 3.5–5.0)
Alkaline Phosphatase: 102 U/L (ref 38–126)
Anion gap: 8 (ref 5–15)
BUN: 8 mg/dL (ref 8–23)
CO2: 26 mmol/L (ref 22–32)
Calcium: 8.9 mg/dL (ref 8.9–10.3)
Chloride: 103 mmol/L (ref 98–111)
Creatinine, Ser: 0.3 mg/dL — ABNORMAL LOW (ref 0.44–1.00)
GFR, Estimated: >60 mL/min (ref >60)
Glucose, Bld: 131 mg/dL — ABNORMAL HIGH (ref 70–99)
Potassium: 3.3 mmol/L — ABNORMAL LOW (ref 3.5–5.1)
Sodium: 137 mmol/L (ref 135–145)
Total Bilirubin: 0.3 mg/dL (ref 0.3–1.2)
Total Protein: 6.1 g/dL — ABNORMAL LOW (ref 6.5–8.1)

## 2021-08-17 LAB — URINALYSIS, ROUTINE W REFLEX MICROSCOPIC
Bilirubin Urine: NEGATIVE
Bilirubin Urine: NEGATIVE
Glucose, UA: NEGATIVE mg/dL
Glucose, UA: NEGATIVE mg/dL
Ketones, ur: NEGATIVE mg/dL
Ketones, ur: 5 mg/dL — AB
Nitrite: POSITIVE — AB
Nitrite: POSITIVE — AB
Protein, ur: 100 mg/dL — AB
Protein, ur: 100 mg/dL — AB
RBC / HPF: >50 RBC/hpf — ABNORMAL HIGH (ref 0–5)
Specific Gravity, Urine: 1.019 (ref 1.005–1.030)
Specific Gravity, Urine: 1.02 (ref 1.005–1.030)
WBC, UA: >50 WBC/hpf — ABNORMAL HIGH (ref 0–5)
pH: 5 (ref 5.0–8.0)
pH: 5 (ref 5.0–8.0)

## 2021-08-17 LAB — LACTIC ACID, PLASMA: Lactic Acid, Venous: 1 mmol/L (ref 0.5–1.9)

## 2021-08-17 LAB — LITHIUM LEVEL: Lithium Lvl: 0.17 mmol/L — ABNORMAL LOW (ref 0.60–1.20)

## 2021-08-17 MED ORDER — QUETIAPINE FUMARATE 100 MG PO TABS
100.0000 mg | ORAL_TABLET | Freq: Every day | ORAL | Status: DC
  Administered 2021-08-17: 100 mg via ORAL
  Filled 2021-08-17: qty 1

## 2021-08-17 MED ORDER — SODIUM CHLORIDE 0.9 % IV SOLN
1.0000 g | Freq: Once | INTRAVENOUS | Status: AC
  Administered 2021-08-17: 1 g via INTRAVENOUS
  Filled 2021-08-17: qty 10

## 2021-08-17 MED ORDER — CEPHALEXIN 500 MG PO CAPS: 500.0000 mg | ORAL_CAPSULE | Freq: Four times a day (QID) | ORAL | 0 refills | Status: DC

## 2021-08-17 MED ORDER — CIPROFLOXACIN HCL 250 MG PO TABS
500.0000 mg | ORAL_TABLET | Freq: Once | ORAL | Status: AC
  Administered 2021-08-17: 500 mg via ORAL
  Filled 2021-08-17: qty 2

## 2021-08-17 MED ORDER — LACTATED RINGERS IV BOLUS
1000.0000 mL | Freq: Once | INTRAVENOUS | Status: AC
  Administered 2021-08-17: 1000 mL via INTRAVENOUS

## 2021-08-17 MED ORDER — CIPROFLOXACIN HCL 500 MG PO TABS: 500.0000 mg | ORAL_TABLET | Freq: Two times a day (BID) | ORAL | 0 refills | Status: AC

## 2021-08-17 NOTE — ED Triage Notes (Signed)
Patient sent from [facility]) for [place] for 2 days. Recently seen here and treated. Alert & oriented x3.  ?

## 2021-08-17 NOTE — ED Notes (Signed)
Convo called ?

## 2021-08-17 NOTE — ED Notes (Signed)
Patient soiled brief and change at this time with perineal care provided.  ?

## 2021-08-17 NOTE — ED Notes (Signed)
Son, tim, called to bring patient dinner due to the ED not having food available. ?

## 2021-08-17 NOTE — ED Notes (Signed)
This nurse was helping patient with her fish sandwich and patient was unable to hold fish sandwich to feed herself. Almyra Free, Pa made aware. Orders for head CT.  ?

## 2021-08-17 NOTE — Discharge Instructions (Addendum)
Take the entire course of the antibiotics prescribed.  Call Dr. Noland Fordyce office for an office visit as he will need to help wean you off of this Foley catheter.  Your lithium level is low today with your level being 0.17.  Continue your current lithium dosing, however you will need to let your primary prescriber of this medication no this blood test level so they can adjust the level if they feel it needs to be adjusted. ?

## 2021-08-17 NOTE — ED Provider Notes (Addendum)
?[facility] ?Provider Note ? ? ?CSN: [id number] ?Arrival date & time: 08/17/21  1246 ? ?  ? ?History ? ?Chief Complaint  ?Patient presents with  ?? Altered Mental Status  ? ? ?[name] is a 66 y.o. female with a history including GERD, dyslipidemia, bipolar disorder including schizoaffective disorder, history of drug abuse, history of lung cancer status post right upper lobectomy who was admitted to this hospital last month secondary to sepsis associated with urinary tract infection.  She was discharged to skilled care nursing facility for short-term rehab.  She presents today as the nursing staff noted her to have increased confusion for the past 2 days.  She has been more forgetful than normal with some mild agitation per nursing reports.  She presents with an indwelling Foley catheter.  Son at the bedside states that this was supposed to remain in place for 2 weeks and then be removed, however the nursing [facility] notes indicate that she has a history of urinary retention, it is unclear whether she has urology follow-up for this concern.  She did not pass her urinary retention test prior to being discharged from her recent hospitalization. ? ?The history is provided by medical records, a relative and the patient (son at bedside).  ? ?  ? ?Home Medications ?Prior to Admission medications   ?Medication Sig Start Date End Date Taking? Authorizing Provider  ?acetaminophen (TYLENOL) 325 MG tablet Take 650 mg by mouth every 4 (four) hours as needed for mild pain.   Yes [provider]  ?ciprofloxacin (CIPRO) 500 MG tablet Take 1 tablet (500 mg total) by mouth every 12 (twelve) hours for 7 days. 08/17/21 08/24/21 Yes Khalifa Knecht, Almyra Free, PA-C  ?folic acid (FOLVITE) 1 MG tablet Take 1 mg by mouth daily.   Yes [provider]  ?gabapentin (NEURONTIN) 100 MG capsule Take 200 mg by mouth 3 (three) times daily. 07/20/21  Yes [provider]  ?lithium 300 MG tablet Take 300-600 mg by mouth in  the morning and at bedtime. 300mg  in the am(0800), 600mg  in th evening(1800) 07/20/21  Yes [provider]  ?Melatonin 10 MG CAPS Take 10 mg by mouth at bedtime. 07/20/21 08/19/21 Yes [provider]  ?midodrine (PROAMATINE) 10 MG tablet Take 1 tablet (10 mg total) by mouth 3 (three) times daily with meals. 08/03/21  Yes Tat, Shanon Brow, MD  ?Multiple Vitamin (MULTIVITAMIN ADULT PO) Take 1 tablet by mouth daily.   Yes [provider]  ?potassium chloride SA (KLOR-CON M) 20 MEQ tablet Take 1 tablet (20 mEq total) by mouth daily. 08/15/21  Yes Daleen Bo, MD  ?QUEtiapine (SEROQUEL) 100 MG tablet Take 100 mg by mouth at bedtime. 07/20/21  Yes [provider]  ?QUEtiapine (SEROQUEL) 25 MG tablet Take 25 mg by mouth in the morning. 07/21/21 08/20/21 Yes [provider]  ?tamsulosin (FLOMAX) 0.4 MG CAPS capsule Take 0.4 mg by mouth daily.   Yes [provider]  ?cefdinir (OMNICEF) 300 MG capsule Take 1 capsule (300 mg total) by mouth every 12 (twelve) hours. X 3 days ?Patient not taking: Reported on 08/17/2021 08/03/21   Orson Eva, MD  ?fluconazole (DIFLUCAN) 200 MG tablet Take 1 tablet (200 mg total) by mouth daily. X 7 days ?Patient not taking: Reported on 08/17/2021 08/03/21   Orson Eva, MD  ?hydrOXYzine (ATARAX) 25 MG tablet Take 25 mg by mouth every 8 (eight) hours as needed for anxiety.    [provider]  ?Study - CAPTIVA -  aspirin 81 mg tablet (PI-Sethi) Chew by mouth.    [provider]  ?   ? ?Allergies    ?Sulfa antibiotics   ? ?Review of Systems   ?Review of Systems  ?Constitutional:  Negative for chills and fever.  ?HENT:  Negative for congestion and sore throat.   ?Eyes: Negative.   ?Respiratory:  Negative for chest tightness and shortness of breath.   ?Cardiovascular:  Negative for chest pain.  ?Gastrointestinal:  Negative for abdominal pain and nausea.  ?Genitourinary: Negative.   ?Musculoskeletal:  Negative for arthralgias, joint swelling and neck pain.   ?Skin: Negative.  Negative for rash and wound.  ?Neurological:  Negative for dizziness, weakness, light-headedness, numbness and headaches.  ?Psychiatric/Behavioral:  Negative for agitation. The patient is nervous/anxious.   ?     Anxious but cooperative.  ?All other systems reviewed and are negative. ? ?Physical Exam ?Updated Vital Signs ?BP 128/89   Pulse (!) 101   Temp 98.1 ?F (36.7 ?C)   Resp 20   Ht 5\' 4"  (1.626 m)   Wt 68.3 kg   SpO2 100%   BMI 25.85 kg/m?  ?Physical Exam ?Vitals and nursing note reviewed.  ?Constitutional:   ?   Appearance: She is well-developed.  ?HENT:  ?   Head: Normocephalic and atraumatic.  ?Eyes:  ?   Conjunctiva/sclera: Conjunctivae normal.  ?Cardiovascular:  ?   Rate and Rhythm: Normal rate and regular rhythm.  ?   Heart sounds: Normal heart sounds.  ?   Comments: Borderline tachycardia ?Pulmonary:  ?   Effort: Pulmonary effort is normal.  ?   Breath sounds: Normal breath sounds. No wheezing.  ?Abdominal:  ?   General: Bowel sounds are normal.  ?   Palpations: Abdomen is soft.  ?   Tenderness: There is no abdominal tenderness. There is no guarding.  ?Musculoskeletal:     ?   General: No swelling or deformity. Normal range of motion.  ?   Cervical back: Normal range of motion.  ?   Comments: Pressure sore left lateral ankle visit here on 4/3, no erythema or drainage, nontender.  Exposed hardware from prior ankle surgical intervention.    ?Skin: ?   General: Skin is warm and dry.  ?Neurological:  ?   Mental Status: She is alert.  ? ? ?ED Results / Procedures / Treatments   ?Labs ?(all labs ordered are listed, but only abnormal results are displayed) ?Labs Reviewed  ?CBC WITH DIFFERENTIAL/PLATELET - Abnormal; Notable for the following components:  ?    Result Value  ? Hemoglobin 11.4 (*)   ? All other components within normal limits  ?COMPREHENSIVE METABOLIC PANEL - Abnormal; Notable for the following components:  ? Potassium 3.3 (*)   ? Glucose, Bld 131 (*)   ? Creatinine, Ser  0.30 (*)   ? Total Protein 6.1 (*)   ? Albumin 2.6 (*)   ? All other components within normal limits  ?URINALYSIS, ROUTINE W REFLEX MICROSCOPIC - Abnormal; Notable for the following components:  ? APPearance HAZY (*)   ? Hgb urine dipstick SMALL (*)   ? Protein, ur 100 (*)   ? Nitrite POSITIVE (*)   ? Leukocytes,Ua MODERATE (*)   ? Bacteria, UA MANY (*)   ? All other components within normal limits  ?URINALYSIS, ROUTINE W REFLEX MICROSCOPIC - Abnormal; Notable for the following components:  ? Color, Urine AMBER (*)   ? APPearance CLOUDY (*)   ? Hgb urine dipstick LARGE (*)   ?  Ketones, ur 5 (*)   ? Protein, ur 100 (*)   ? Nitrite POSITIVE (*)   ? Leukocytes,Ua LARGE (*)   ? RBC / HPF >50 (*)   ? WBC, UA >50 (*)   ? Bacteria, UA MANY (*)   ? All other components within normal limits  ?LITHIUM LEVEL - Abnormal; Notable for the following components:  ? Lithium Lvl 0.17 (*)   ? All other components within normal limits  ?CULTURE, BLOOD (ROUTINE X 2)  ?CULTURE, BLOOD (ROUTINE X 2)  ?LACTIC ACID, PLASMA  ? ? ?EKG ?EKG Interpretation ? ?Date/Time:  Wednesday August 17 2021 19:12:10 EDT ?Ventricular Rate:  103 ?PR Interval:  137 ?QRS Duration: 96 ?QT Interval:  326 ?QTC Calculation: 427 ?R Axis:   59 ?Text Interpretation: Sinus tachycardia Low voltage, precordial leads Borderline T abnormalities, anterior leads Confirmed by Dorie Rank [phone]) on 08/17/2021 7:30:14 PM ? ?Radiology ?No results found. ? ?Procedures ?Procedures  ? ? ?Medications Ordered in ED ?Medications  ?lactated ringers bolus 1,000 mL (0 mLs Intravenous Stopped 08/17/21 1856)  ?cefTRIAXone (ROCEPHIN) 1 g in sodium chloride 0.9 % 100 mL IVPB (0 g Intravenous Stopped 08/17/21 1739)  ?ciprofloxacin (CIPRO) tablet 500 mg (500 mg Oral Given 08/17/21 1931)  ? ? ?ED Course/ Medical Decision Making/ A&P ?Clinical Course as of 08/17/21 2030  ?Wed Aug 17, 2021  ?2029 At time of discharge patient was given a sandwich and it was noted that she had difficulty holding the  sandwich in her right hand and she appears to have contractures of her fingers of her right hand.  Review of the chart indicating she had an OT evaluation prior to her discharge to the nursing home at which time th

## 2021-08-20 LAB — CULTURE, BLOOD (ROUTINE X 2)
Culture: NO GROWTH
Culture: NO GROWTH
Special Requests: ADEQUATE
Special Requests: ADEQUATE

## 2021-08-22 LAB — CULTURE, BLOOD (ROUTINE X 2)
Culture: NO GROWTH
Culture: NO GROWTH
Special Requests: ADEQUATE
Special Requests: ADEQUATE

## 2021-08-23 ENCOUNTER — Ambulatory Visit (INDEPENDENT_AMBULATORY_CARE_PROVIDER_SITE_OTHER): Payer: Self-pay | Admitting: Physician Assistant

## 2021-08-23 VITALS — BP 88/57 | HR 101 | Ht 68.0 in | Wt 140.0 lb

## 2021-08-23 DIAGNOSIS — S91002A Unspecified open wound, left ankle, initial encounter: Secondary | ICD-10-CM

## 2021-08-23 DIAGNOSIS — R338 Other retention of urine: Secondary | ICD-10-CM

## 2021-08-23 DIAGNOSIS — N390 Urinary tract infection, site not specified: Secondary | ICD-10-CM

## 2021-08-23 DIAGNOSIS — R319 Hematuria, unspecified: Secondary | ICD-10-CM

## 2021-08-23 NOTE — Progress Notes (Signed)
? ?08/23/2021 ?9:13 AM  ? ?[name] ?1956-04-21 ?[id number] ? ? ?Assessment: ? ?1. Acute urinary retention   ?2. Urinary tract infection with hematuria, site unspecified   ?3. Ankle wound, left   ? ?Plan: ?Findings and plan of care discussed at length with the patient.  As she remains essentially wheelchair-bound and is attempting physical therapy, a voiding trial is not appropriate at this time.  Her ankle wound is being managed in the nursing facility.  Orders are written for catheter changes once monthly and ongoing Foley catheter care.  Would recommend a voiding trial in the facility which the patient is ambulatory and able to at least get to a bedside commode when she needs to void.  We will see her for office visit and PVR after voiding trial.  Also of note, the patient's blood pressure was noted to be somewhat low on today's exam.  The patient reports that this is normal for her and she has no symptoms of dizziness or lightheadedness. ? ?Chief Complaint: Voiding trial ? ?Referring provider: Emergency department ? ? ?History of Present Illness: ? ?[name] is a 66 y.o. year old female, currently residing at [facility], a skilled facility, with h/o urosepsis and admission on 07/27/2021 who is seen in consultation from the ED for evaluation of urinary retention and recent treatment for urosepsis.  Urine culture on admission grew 40,000 colonies of yeast, no bacteria.  Medical records indicate the patient was treated with Diflucan x7 days. She has had an indwelling catheter since admission and failed voiding trial prior to discharge on 08/03/2021.  Patient was seen a second time in the emergency department for altered mental status, evaluated on 08/17/2021, and diagnosed with UTI. No culture results for this visit available in EMR.  She was treated with Cipro and discharged back to the nursing facility with instructions to follow-up here for voiding trial.  She has essentially been wheelchair-bound  since her original admission in mid March, but states she is working with physical therapy to attempt ambulation. Pt reports PT is difficult due to an open wound on her left ankle with exposed hardware from previous injury.  She reports that this is very painful for her and makes it very difficult to walk. ?Today, her Foley is draining well and contents are clear and light yellow.  She denies complaints of abdominal pain, gross hematuria, fever, chills.  She denies history of urinary retention or UTIs prior to this admission. ?Medical history is also significant for history of GERD, dyslipidemia, bipolar disorder, schizoaffective disorder, drug abuse, and history of lung cancer.  She is also currently wheelchair dependent secondary to open wound and exposed hardware of her left lower extremity. ?Patient's medical records including notes, laboratory results, imaging personally reviewed during office visit. ? ?08/17/21 ?[name] is a 66 y.o. female with a history including GERD, dyslipidemia, bipolar disorder including schizoaffective disorder, history of drug abuse, history of lung cancer status post right upper lobectomy who was admitted to this hospital last month secondary to sepsis associated with urinary tract infection.  She was discharged to skilled care nursing facility for short-term rehab.  She presents today as the nursing staff noted her to have increased confusion for the past 2 days.  She has been more forgetful than normal with some mild agitation per nursing reports.  She presents with an indwelling Foley catheter.  Son at the bedside states that this was supposed to remain in place for 2 weeks and  then be removed, however the nursing [facility] notes indicate that she has a history of urinary retention, it is unclear whether she has urology follow-up for this concern.  She did not pass her urinary retention test prior to being discharged from her recent hospitalization. ? ?Portions of the above  documentation were copied from ED visit for review purposes only. ?Past Medical History:  ?Past Medical History:  ?Diagnosis Date  ? Allergy   ? Depression   ? Drug abuse ([place])   ? history of, went to rehab 2015  ? GERD (gastroesophageal reflux disease)   ? Lung cancer ([place])   ? lung ca dx 11/11- right upper lobe  ? ? ?Past Surgical History:  ?Past Surgical History:  ?Procedure Laterality Date  ? ANKLE FRACTURE SURGERY Left   ? LUNG LOBECTOMY Right 2011  ? RUL removed for lung cancer  ? STYLOID PROCESS EXCISION Left 10/16/2014  ? Procedure: EXCISION LEFT STYLOID PROCESS;  Surgeon: Rozetta Nunnery, MD;  Location: [facility];  Service: ENT;  Laterality: Left;  ? TONSILLECTOMY Bilateral 10/16/2014  ? Procedure: BILATERAL TONSILLECTOMY;  Surgeon: Rozetta Nunnery, MD;  Location: [facility];  Service: ENT;  Laterality: Bilateral;  ? [place]  ? ? ?Allergies:  ?Allergies  ?Allergen Reactions  ? Sulfa Antibiotics Swelling and Rash  ?  Neck swelling  ? ? ?Family History:  ?Family History  ?Problem Relation Age of Onset  ? Cancer Mother   ?     breast cancer  ? Cancer Father   ?     stomach cancer  ? Diabetes Father   ? Cancer Sister   ?     breast cancer  ? Diabetes Brother   ? Diabetes Brother   ? Cancer Sister   ?     breast cancer  ? ? ?Social History:  ?Social History  ? ?Tobacco Use  ? Smoking status: Every Day  ?  Packs/day: 1.00  ?  Years: 40.00  ?  Pack years: 40.00  ?  Types: Cigarettes  ? Smokeless tobacco: Never  ?Vaping Use  ? Vaping Use: Former  ?Substance Use Topics  ? Alcohol use: Not Currently  ?  Comment: hx alcoholism.  none since 2017  ? Drug use: Not Currently  ?  Types: Benzodiazepines  ?  Comment: hx of xanax abuse. last use 2015  ? ? ?Review of symptoms:  ?Constitutional:  Negative for unexplained weight loss, night sweats, fever, chills ?ENT:  Negative for nose bleeds, sinus pain, painful swallowing ?CV:  Negative for chest pain, shortness of  breath ?Resp:  Negative for cough, wheezing, shortness of breath ?GI:  Negative for nausea, vomiting, diarrhea, bloody stools ?GU:  Positives noted in HPI; otherwise negative for gross hematuria ?Neuro:  Negative for seizures ?Endocrine:  Negative for polydipsia, polyuria, symptoms of hypoglycemia (dizziness, hunger, sweating) ?Hematologic:  Negative for petechia, prolonged or excessive bleeding ? ?Physical Exam: ?BP (!) 88/57   Pulse (!) 101   Ht 5\' 8"  (1.727 m)   Wt 140 lb (63.5 kg)   BMI 21.29 kg/m?   ?Constitutional:  Alert and oriented, No acute distress.  Patient is in wheelchair and it noted to have contractures of her right hand. ?HEENT: NCAT, moist mucus membranes.  Trachea midline, no masses. ?Cardiovascular: Regular rate and rhythm without murmur, rub, or gallops ?No clubbing, cyanosis, or edema. ?Respiratory: Normal respiratory effort, clear to auscultation bilaterally ?GI: Abdomen is soft, nontender, nondistended, no abdominal  masses ?BACK:  Non-tender to palpation.  No CVAT ?Skin: No obvious rashes, warm, dry, intact ?Neurologic: Alert and oriented ?Psychiatric: Appropriate. Normal mood and affect. ? ?Laboratory Data: ?No results found for this or any previous visit (from the past 24 hour(s)). ? ?Lab Results  ?Component Value Date  ? WBC 10.0 08/17/2021  ? HGB 11.4 (L) 08/17/2021  ? HCT 37.3 08/17/2021  ? MCV 95.9 08/17/2021  ? PLT 295 08/17/2021  ? ? ?Lab Results  ?Component Value Date  ? CREATININE 0.30 (L) 08/17/2021  ? ? ?Lab Results  ?Component Value Date  ? HGBA1C 5.4 11/22/2017  ? ? ?Urinalysis ?   ?Component Value Date/Time  ? [place] (A) 08/17/2021 1725  ? APPEARANCEUR CLOUDY (A) 08/17/2021 1725  ? LABSPEC 1.020 08/17/2021 1725  ? PHURINE 5.0 08/17/2021 1725  ? GLUCOSEU NEGATIVE 08/17/2021 1725  ? HGBUR LARGE (A) 08/17/2021 1725  ? [place] NEGATIVE 08/17/2021 1725  ? KETONESUR 5 (A) 08/17/2021 1725  ? PROTEINUR 100 (A) 08/17/2021 1725  ? UROBILINOGEN 0.2 03/11/2010 1057  ?  NITRITE POSITIVE (A) 08/17/2021 1725  ? LEUKOCYTESUR LARGE (A) 08/17/2021 1725  ? ? ?Lab Results  ?Component Value Date  ? BACTERIA MANY (A) 08/17/2021  ? ? ?Pertinent Imaging: ?[place], Berneice Heinrich, PA-C ?[place]

## 2021-09-13 ENCOUNTER — Telehealth: Payer: Self-pay | Admitting: Radiology

## 2021-09-13 NOTE — Telephone Encounter (Signed)
[name] called, asking to schedule an appt for this patient, stating she has an open wound ankle w/ hardware showing.  Per Dr Luna Glasgow, advised needs to go to the ED for eval.  I called Delcie Roch and advised.  ?

## 2021-09-14 ENCOUNTER — Telehealth: Payer: Self-pay | Admitting: Orthopaedic Surgery

## 2021-09-14 NOTE — Telephone Encounter (Signed)
I called back to facility; spoke again with Rinaldo Ratel, relayed per administrator Wendy's response. States will call as noted. ?

## 2021-09-14 NOTE — Telephone Encounter (Signed)
Call received today, 09/14/21, from facility, now known as [facility], formerly known [facility] and also [facility], phone [phone] - per Rinaldo Ratel, states as per note, that patient has open wound of ankle; relays had surgery (not by any of our providers) in 2015 or 2016.  States "has hardware exposed." * ?*I relayed as per Dr Luna Glasgow on previous note to go to Emergency room.  Juliann Pulse states "the last time we took her there, they said to follow up here"  ? -  same advice?   ?

## 2021-09-20 ENCOUNTER — Ambulatory Visit: Payer: Self-pay | Admitting: Physician Assistant

## 2021-09-20 DIAGNOSIS — R338 Other retention of urine: Secondary | ICD-10-CM

## 2021-09-26 ENCOUNTER — Ambulatory Visit (INDEPENDENT_AMBULATORY_CARE_PROVIDER_SITE_OTHER): Payer: Self-pay | Admitting: Physician Assistant

## 2021-09-26 ENCOUNTER — Telehealth: Payer: Self-pay

## 2021-09-26 VITALS — BP 112/62 | HR 103 | Ht 68.0 in | Wt 144.0 lb

## 2021-09-26 DIAGNOSIS — R338 Other retention of urine: Secondary | ICD-10-CM

## 2021-09-26 LAB — BLADDER SCAN AMB NON-IMAGING: Scan Result: 20

## 2021-09-26 NOTE — Progress Notes (Signed)
post void residual =8mL ?

## 2021-09-26 NOTE — Telephone Encounter (Signed)
Encounter made in error. 

## 2021-09-26 NOTE — Progress Notes (Signed)
? ?Assessment: ?1. Acute urinary retention ?- Urinalysis, Routine w reflex microscopic ?- BLADDER SCAN AMB NON-IMAGING ?  ? ?Plan: ?No indication for replacing foley today. She is a risk for UTI as she remains non-ambulatory and is doing poorly with ambulation due to ongoing health issues. FU in 6-8 weeks for UA/PVR ?Orders given for facility to run prn UA, PVR, foley placement if needed ? ?Chief Complaint: ?No chief complaint on file. ? ? ?HPI: ?[name] is a 66 y.o. female who presents for continued evaluation of urinary retention.  She states she woke up this morning and her catheter was lying in the bed.  She states this occurred after she felt and heard a pop sensation.  No pain at present.  She has been able to void easily since.  She is wearing diapers at all times.  No complaint of burning, dysuria, gross hematuria, urgency ?Bladder scan 33ml ?UA could not be performed as patient needs a [employer] fast streamer for transfers. ? ?08/23/21 ?Plan: ?Findings and plan of care discussed at length with the patient.  As she remains essentially wheelchair-bound and is attempting physical therapy, a voiding trial is not appropriate at this time.  Her ankle wound is being managed in the nursing facility.  Orders are written for catheter changes once monthly and ongoing Foley catheter care.  Would recommend a voiding trial in the facility which the patient is ambulatory and able to at least get to a bedside commode when she needs to void.  We will see her for office visit and PVR after voiding trial.  Also of note, the patient's blood pressure was noted to be somewhat low on today's exam.  The patient reports that this is normal for her and she has no symptoms of dizziness or lightheadedness. ?  ?  ?08/23/21  ?[name] is a 66 y.o. year old female, currently residing at [facility], a skilled facility, with h/o urosepsis and admission on 07/27/2021 who is seen in consultation from the ED for evaluation of urinary  retention and recent treatment for urosepsis.  Urine culture on admission grew 40,000 colonies of yeast, no bacteria.  Medical records indicate the patient was treated with Diflucan x7 days. She has had an indwelling catheter since admission and failed voiding trial prior to discharge on 08/03/2021.  Patient was seen a second time in the emergency department for altered mental status, evaluated on 08/17/2021, and diagnosed with UTI. No culture results for this visit available in EMR.  She was treated with Cipro and discharged back to the nursing facility with instructions to follow-up here for voiding trial.  She has essentially been wheelchair-bound since her original admission in mid March, but states she is working with physical therapy to attempt ambulation. Pt reports PT is difficult due to an open wound on her left ankle with exposed hardware from previous injury.  She reports that this is very painful for her and makes it very difficult to walk. ?Today, her Foley is draining well and contents are clear and light yellow.  She denies complaints of abdominal pain, gross hematuria, fever, chills.  She denies history of urinary retention or UTIs prior to this admission. ?Medical history is also significant for history of GERD, dyslipidemia, bipolar disorder, schizoaffective disorder, drug abuse, and history of lung cancer.  She is also currently wheelchair dependent secondary to open wound and exposed hardware of her left lower extremity. ?Patient's medical records including notes, laboratory results, imaging personally reviewed during office  visit. ?  ?08/17/21 ?[name] is a 66 y.o. female with a history including GERD, dyslipidemia, bipolar disorder including schizoaffective disorder, history of drug abuse, history of lung cancer status post right upper lobectomy who was admitted to this hospital last month secondary to sepsis associated with urinary tract infection.  She was discharged to skilled care nursing  facility for short-term rehab.  She presents today as the nursing staff noted her to have increased confusion for the past 2 days.  She has been more forgetful than normal with some mild agitation per nursing reports.  She presents with an indwelling Foley catheter.  Son at the bedside states that this was supposed to remain in place for 2 weeks and then be removed, however the nursing [facility] notes indicate that she has a history of urinary retention, it is unclear whether she has urology follow-up for this concern.  She did not pass her urinary retention test prior to being discharged from her recent hospitalization. ?Portions of the above documentation were copied from a prior visit for review purposes only. ? ?Allergies: ?Allergies  ?Allergen Reactions  ? Sulfa Antibiotics Swelling and Rash  ?  Neck swelling  ? ? ?PMH: ?Past Medical History:  ?Diagnosis Date  ? Allergy   ? Depression   ? Drug abuse ([place])   ? history of, went to rehab 2015  ? GERD (gastroesophageal reflux disease)   ? Lung cancer ([place])   ? lung ca dx 11/11- right upper lobe  ? ? ?PSH: ?Past Surgical History:  ?Procedure Laterality Date  ? ANKLE FRACTURE SURGERY Left   ? LUNG LOBECTOMY Right 2011  ? RUL removed for lung cancer  ? STYLOID PROCESS EXCISION Left 10/16/2014  ? Procedure: EXCISION LEFT STYLOID PROCESS;  Surgeon: Rozetta Nunnery, MD;  Location: [place];  Service: ENT;  Laterality: Left;  ? TONSILLECTOMY Bilateral 10/16/2014  ? Procedure: BILATERAL TONSILLECTOMY;  Surgeon: Rozetta Nunnery, MD;  Location: [place];  Service: ENT;  Laterality: Bilateral;  ? [place]  ? ? ?SH: ?Social History  ? ?Tobacco Use  ? Smoking status: Every Day  ?  Packs/day: 1.00  ?  Years: 40.00  ?  Pack years: 40.00  ?  Types: Cigarettes  ? Smokeless tobacco: Never  ?Vaping Use  ? Vaping Use: Former  ?Substance Use Topics  ? Alcohol use: Not Currently  ?  Comment: hx alcoholism.  none since 2017  ? Drug use: Not  Currently  ?  Types: Benzodiazepines  ?  Comment: hx of xanax abuse. last use 2015  ? ? ?ROS: ?See HPI ? ?PE: ?BP 112/62   Pulse (!) 103   Ht 5\' 8"  (1.727 m)   Wt 144 lb (65.3 kg)   BMI 21.90 kg/m?  ?GENERAL APPEARANCE: NAD ?HEENT:  Atraumatic, normocephalic ?NECK:  Supple. Trachea midline ?ABDOMEN:  Soft, non-tender, no masses ?EXTREMITIES: B heel protectors on, no edema ?NEUROLOGIC:  Alert and oriented x 3, in wheelchair ?MENTAL STATUS:  appropriate ?BACK:  Non-tender to palpation, No CVAT ?SKIN:  Warm, dry, and intact ? ?Results: ?Laboratory Data: ?Lab Results  ?Component Value Date  ? WBC 10.0 08/17/2021  ? HGB 11.4 (L) 08/17/2021  ? HCT 37.3 08/17/2021  ? MCV 95.9 08/17/2021  ? PLT 295 08/17/2021  ? ? ?Lab Results  ?Component Value Date  ? CREATININE 0.30 (L) 08/17/2021  ? ? ?Lab Results  ?Component Value Date  ? HGBA1C 5.4 11/22/2017  ? ? ?  Urinalysis ?   ?Component Value Date/Time  ? English (A) 08/17/2021 1725  ? APPEARANCEUR CLOUDY (A) 08/17/2021 1725  ? LABSPEC 1.020 08/17/2021 1725  ? PHURINE 5.0 08/17/2021 1725  ? GLUCOSEU NEGATIVE 08/17/2021 1725  ? HGBUR LARGE (A) 08/17/2021 1725  ? [place] NEGATIVE 08/17/2021 1725  ? KETONESUR 5 (A) 08/17/2021 1725  ? PROTEINUR 100 (A) 08/17/2021 1725  ? UROBILINOGEN 0.2 03/11/2010 1057  ? NITRITE POSITIVE (A) 08/17/2021 1725  ? LEUKOCYTESUR LARGE (A) 08/17/2021 1725  ? ? ?Lab Results  ?Component Value Date  ? BACTERIA MANY (A) 08/17/2021  ? ? ?Pertinent Imaging: ? ?No results found for this or any previous visit. ? ?No results found for this or any previous visit. ? ?No results found for this or any previous visit (from the past 24 hour(s)).  ?

## 2021-09-26 NOTE — Telephone Encounter (Signed)
Telephone E ?

## 2021-12-07 ENCOUNTER — Other Ambulatory Visit: Payer: Self-pay | Admitting: Orthopaedic Surgery

## 2021-12-07 NOTE — H&P (Signed)
[name] is an 66 y.o. female.   Chief Complaint: Left ankle pain HPI: [name] is here again about her left ankle.  She continues to reside at her nursing center in [place].  At her last visit I wanted her to get to a wound specialist.  I think what probably has happened is a nurse has just been changing her bandage on her ankle.  At this point she has exposed hardware.  She is here with her daughter who normally lives in [place].    Imaging/Tests: I reviewed some films on the [facility] system performed 08/15/21.  These show good placement of a fibular plate with screws including an interfragmentary screw.  I see no evidence of hardware failure.  Past Medical History:  Diagnosis Date   Allergy    Depression    Drug abuse ([facility])    history of, went to rehab 2015   GERD (gastroesophageal reflux disease)    Lung cancer ([place])    lung ca dx 11/11- right upper lobe    Past Surgical History:  Procedure Laterality Date   ANKLE FRACTURE SURGERY Left    LUNG LOBECTOMY Right 2011   RUL removed for lung cancer   STYLOID PROCESS EXCISION Left 10/16/2014   Procedure: EXCISION LEFT STYLOID PROCESS;  Surgeon: Rozetta Nunnery, MD;  Location: [place];  Service: ENT;  Laterality: Left;   TONSILLECTOMY Bilateral 10/16/2014   Procedure: BILATERAL TONSILLECTOMY;  Surgeon: Rozetta Nunnery, MD;  Location: [place];  Service: ENT;  Laterality: Bilateral;   TUBAL LIGATION  1984    Family History  Problem Relation Age of Onset   Cancer Mother        breast cancer   Cancer Father        stomach cancer   Diabetes Father    Cancer Sister        breast cancer   Diabetes Brother    Diabetes Brother    Cancer Sister        breast cancer   Social History:  reports that she has been smoking cigarettes. She has a 40.00 pack-year smoking history. She has never used smokeless tobacco. She reports that she does not currently use alcohol. She reports that she does not  currently use drugs after having used the following drugs: Benzodiazepines.  Allergies:  Allergies  Allergen Reactions   Sulfa Antibiotics Swelling and Rash    Neck swelling    No medications prior to admission.    No results found for this or any previous visit (from the past 48 hour(s)). No results found.  Review of Systems  Musculoskeletal:  Positive for arthralgias.       Left ankle  All other systems reviewed and are negative.   There were no vitals taken for this visit. Physical Exam Constitutional:      Appearance: Normal appearance.  HENT:     Head: Normocephalic and atraumatic.     Mouth/Throat:     Pharynx: Oropharynx is clear.  Eyes:     Extraocular Movements: Extraocular movements intact.  Cardiovascular:     Rate and Rhythm: Normal rate.  Pulmonary:     Effort: Pulmonary effort is normal.  Abdominal:     Palpations: Abdomen is soft.  Musculoskeletal:     Cervical back: Normal range of motion.     Comments: At this point at the left ankle she has about a 2 cm wound with exposed hardware.  There is no drainage.  Her foot does feel warm.    Skin:    General: Skin is warm and dry.  Neurological:     General: No focal deficit present.     Mental Status: She is alert and oriented to person, place, and time.  Psychiatric:        Mood and Affect: Mood normal.        Behavior: Behavior normal.        Thought Content: Thought content normal.        Judgment: Judgment normal.      Assessment/Plan Assessment:  Left ankle wound with exposed hardware  Plan: At this point there is not much of an alternative to removing the hardware about [name]'s ankle.  Ideally she would have met with a wound specialist but as far as I can tell that did not really happen.  I am going to remove the hardware and try to close her wound.  We will keep her on some antibiotics postoperatively.  I stressed with Redina and her daughter extensively that I really do not know if this will  heal as part of the problem is likely vascular insufficiency.  She may need to have vascular see her down the road if this does not heal.  I have also stressed that she could potentially lose her leg not related to this infection but related to poor perfusion.  Larwance Sachs Ramie Erman, PA-C 12/07/2021, 1:49 PM

## 2021-12-08 NOTE — Progress Notes (Signed)
COVID Vaccine Completed:  Date of COVID positive in last 90 days:  PCP -  Cardiologist -   Chest x-ray - 1V 07-27-21 Epic EKG - 08-17-21 Epic Stress Test -  ECHO - 07-07-21 [facility] Cardiac Cath -  Pacemaker/ICD device last checked: Spinal Cord Stimulator:  Bowel Prep -   Sleep Study -  CPAP -   Fasting Blood Sugar -  Checks Blood Sugar _____ times a day  Blood Thinner Instructions: Aspirin Instructions: Last Dose:  Activity level:  Can go up a flight of stairs and perform activities of daily living without stopping and without symptoms of chest pain or shortness of breath.  Able to exercise without symptoms  Unable to go up a flight of stairs without symptoms of     Anesthesia review:   Patient denies shortness of breath, fever, cough and chest pain at PAT appointment  Patient verbalized understanding of instructions that were given to them at the PAT appointment. Patient was also instructed that they will need to review over the PAT instructions again at home before surgery.

## 2021-12-12 ENCOUNTER — Encounter (HOSPITAL_COMMUNITY): Payer: Self-pay | Admitting: Orthopaedic Surgery

## 2021-12-12 NOTE — Anesthesia Preprocedure Evaluation (Signed)
Anesthesia Evaluation  Patient identified by MRN, date of birth, ID band Patient awake    Reviewed: Allergy & Precautions, NPO status , Patient's Chart, lab work & pertinent test results  Airway Mallampati: III  TM Distance: >3 FB Neck ROM: Full    Dental no notable dental hx. (+) Edentulous Upper, Missing, Poor Dentition, Dental Advisory Given,    Pulmonary Current Smoker, former smoker,  Lung CA  S/P  LLL   Pulmonary exam normal breath sounds clear to auscultation       Cardiovascular Exercise Tolerance: Good Normal cardiovascular exam Rhythm:Regular Rate:Normal     Neuro/Psych negative neurological ROS  negative psych ROS   GI/Hepatic GERD  ,  Endo/Other  negative endocrine ROS  Renal/GU      Musculoskeletal  (+) Arthritis ,   Abdominal   Peds  Hematology   Anesthesia Other Findings ALL: Sulfa  Reproductive/Obstetrics                            Anesthesia Physical Anesthesia Plan  ASA: 2  Anesthesia Plan: Regional and MAC   Post-op Pain Management: Regional block*   Induction:   PONV Risk Score and Plan: Treatment may vary due to age or medical condition, Midazolam and Ondansetron  Airway Management Planned: Nasal Cannula and Natural Airway  Additional Equipment: None  Intra-op Plan:   Post-operative Plan:   Informed Consent: I have reviewed the patients History and Physical, chart, labs and discussed the procedure including the risks, benefits and alternatives for the proposed anesthesia with the patient or authorized representative who has indicated his/her understanding and acceptance.     Dental advisory given  Plan Discussed with:   Anesthesia Plan Comments: (L. Popliteal  w MAC)       Anesthesia Quick Evaluation

## 2021-12-12 NOTE — Progress Notes (Signed)
Preop instructions for:     [name] Date of Birth:  2055-09-01                     Date of Procedure:   12-13-21 Procedure:     Left ankle hardware removal Surgeon:  [place] contact:    Phone: [place]: RN contact name/phone#:                          and Fax #:   [phone]   Transportation contact phone#:     Time to arrive at [facility]:  11:00 AM   Report to: Admitting (On your left hand side)    Do not eat or drink past midnight the night before your procedure.(To include any tube feedings-must be discontinued)   Take these morning medications only with sips of water.(or give through gastrostomy or feeding tube).  Tylenol, Gabapentin, Lithium, Midodrine, Seroquel, Tamsulosin  Note: No Insulin or Diabetic meds should be given or taken the morning of the procedure!    Please send day of procedure:current med list and meds last taken that day, confirm nothing by mouth status from what time, Patient Demographic info( to include DNR status, problem list, allergies)   Bring Insurance card and picture ID Leave all jewelry and other valuables at place where living( no metal or rings to be worn) No contact lens Women-no make-up, no lotions,perfumes,powders Men-no colognes,lotions   Any questions day of procedure,call  SHORT STAY-[phone]     Sent from :[facility]                   Phone:[phone]                   Fax:[phone]   Sent by :    Norvel Richards, RN

## 2021-12-13 ENCOUNTER — Ambulatory Visit (HOSPITAL_BASED_OUTPATIENT_CLINIC_OR_DEPARTMENT_OTHER): Payer: Medicaid Other | Admitting: Anesthesiology

## 2021-12-13 ENCOUNTER — Ambulatory Visit (HOSPITAL_COMMUNITY): Payer: Medicaid Other | Admitting: Anesthesiology

## 2021-12-13 ENCOUNTER — Encounter (HOSPITAL_COMMUNITY)
Admission: RE | Disposition: A | Discharge status: Home / Self Care | Payer: Self-pay | Source: Home / Self Care | Attending: Orthopaedic Surgery

## 2021-12-13 ENCOUNTER — Other Ambulatory Visit: Payer: Self-pay

## 2021-12-13 ENCOUNTER — Encounter (HOSPITAL_COMMUNITY): Payer: Self-pay | Admitting: Orthopaedic Surgery

## 2021-12-13 ENCOUNTER — Ambulatory Visit (HOSPITAL_COMMUNITY)
Admission: RE | Admit: 2021-12-13 | Discharge: 2021-12-13 | Disposition: A | Discharge status: Home / Self Care | Payer: Medicaid Other | Attending: Orthopaedic Surgery | Admitting: Orthopaedic Surgery

## 2021-12-13 DIAGNOSIS — T84629A Infection and inflammatory reaction due to internal fixation device of unspecified bone of leg, initial encounter: Secondary | ICD-10-CM | POA: Insufficient documentation

## 2021-12-13 DIAGNOSIS — Z85118 Personal history of other malignant neoplasm of bronchus and lung: Secondary | ICD-10-CM | POA: Diagnosis not present

## 2021-12-13 DIAGNOSIS — Y798 Miscellaneous orthopedic devices associated with adverse incidents, not elsewhere classified: Secondary | ICD-10-CM | POA: Insufficient documentation

## 2021-12-13 DIAGNOSIS — T8484XA Pain due to internal orthopedic prosthetic devices, implants and grafts, initial encounter: Secondary | ICD-10-CM | POA: Insufficient documentation

## 2021-12-13 DIAGNOSIS — M25572 Pain in left ankle and joints of left foot: Secondary | ICD-10-CM | POA: Diagnosis present

## 2021-12-13 DIAGNOSIS — M1990 Unspecified osteoarthritis, unspecified site: Secondary | ICD-10-CM | POA: Insufficient documentation

## 2021-12-13 DIAGNOSIS — F1910 Other psychoactive substance abuse, uncomplicated: Secondary | ICD-10-CM

## 2021-12-13 DIAGNOSIS — T8489XA Other specified complication of internal orthopedic prosthetic devices, implants and grafts, initial encounter: Secondary | ICD-10-CM | POA: Diagnosis not present

## 2021-12-13 HISTORY — PX: HARDWARE REMOVAL: SHX979

## 2021-12-13 HISTORY — DX: Bipolar disorder, unspecified: F31.9

## 2021-12-13 LAB — COMPREHENSIVE METABOLIC PANEL
ALT: 11 U/L (ref 0–44)
AST: 17 U/L (ref 15–41)
Albumin: 2.8 g/dL — ABNORMAL LOW (ref 3.5–5.0)
Alkaline Phosphatase: 90 U/L (ref 38–126)
Anion gap: 9 (ref 5–15)
BUN: 6 mg/dL — ABNORMAL LOW (ref 8–23)
CO2: 27 mmol/L (ref 22–32)
Calcium: 10.1 mg/dL (ref 8.9–10.3)
Chloride: 104 mmol/L (ref 98–111)
Creatinine, Ser: 0.34 mg/dL — ABNORMAL LOW (ref 0.44–1.00)
GFR, Estimated: >60 mL/min (ref >60)
Glucose, Bld: 138 mg/dL — ABNORMAL HIGH (ref 70–99)
Potassium: 4.2 mmol/L (ref 3.5–5.1)
Sodium: 140 mmol/L (ref 135–145)
Total Bilirubin: 0.4 mg/dL (ref 0.3–1.2)
Total Protein: 6.7 g/dL (ref 6.5–8.1)

## 2021-12-13 SURGERY — REMOVAL, HARDWARE
Anesthesia: Monitor Anesthesia Care | Laterality: Left

## 2021-12-13 MED ORDER — PROPOFOL 1000 MG/100ML IV EMUL
INTRAVENOUS | Status: AC
  Filled 2021-12-13: qty 100

## 2021-12-13 MED ORDER — LIDOCAINE HCL (CARDIAC) PF 100 MG/5ML IV SOSY
PREFILLED_SYRINGE | INTRAVENOUS | Status: DC | PRN
  Administered 2021-12-13: 20 mg via INTRAVENOUS

## 2021-12-13 MED ORDER — ONDANSETRON HCL 4 MG/2ML IJ SOLN: 4.0000 mg | Freq: Once | INTRAMUSCULAR | Status: DC | PRN

## 2021-12-13 MED ORDER — FENTANYL CITRATE (PF) 100 MCG/2ML IJ SOLN
INTRAMUSCULAR | Status: DC | PRN
  Administered 2021-12-13: 25 ug via INTRAVENOUS

## 2021-12-13 MED ORDER — PROPOFOL 10 MG/ML IV BOLUS
INTRAVENOUS | Status: AC
  Filled 2021-12-13: qty 20

## 2021-12-13 MED ORDER — LIDOCAINE HCL (PF) 2 % IJ SOLN
INTRAMUSCULAR | Status: AC
  Filled 2021-12-13: qty 5

## 2021-12-13 MED ORDER — ONDANSETRON HCL 4 MG/2ML IJ SOLN
INTRAMUSCULAR | Status: DC | PRN
  Administered 2021-12-13: 4 mg via INTRAVENOUS

## 2021-12-13 MED ORDER — CEFAZOLIN SODIUM-DEXTROSE 2-4 GM/100ML-% IV SOLN
2.0000 g | INTRAVENOUS | Status: AC
  Administered 2021-12-13: 2 g via INTRAVENOUS
  Filled 2021-12-13: qty 100

## 2021-12-13 MED ORDER — OXYCODONE HCL 5 MG PO TABS: 5.0000 mg | ORAL_TABLET | Freq: Once | ORAL | Status: DC | PRN

## 2021-12-13 MED ORDER — FENTANYL CITRATE (PF) 100 MCG/2ML IJ SOLN
INTRAMUSCULAR | Status: AC
  Filled 2021-12-13: qty 2

## 2021-12-13 MED ORDER — OXYCODONE HCL 5 MG/5ML PO SOLN: 5.0000 mg | Freq: Once | ORAL | Status: DC | PRN

## 2021-12-13 MED ORDER — ACETAMINOPHEN 10 MG/ML IV SOLN: 1000.0000 mg | Freq: Once | INTRAVENOUS | Status: DC | PRN

## 2021-12-13 MED ORDER — CHLORHEXIDINE GLUCONATE 0.12 % MT SOLN
15.0000 mL | Freq: Once | OROMUCOSAL | Status: AC
  Administered 2021-12-13: 15 mL via OROMUCOSAL

## 2021-12-13 MED ORDER — LACTATED RINGERS IV SOLN: INTRAVENOUS | Status: DC

## 2021-12-13 MED ORDER — DEXAMETHASONE SODIUM PHOSPHATE 10 MG/ML IJ SOLN
INTRAMUSCULAR | Status: AC
  Filled 2021-12-13: qty 1

## 2021-12-13 MED ORDER — FENTANYL CITRATE PF 50 MCG/ML IJ SOSY
100.0000 ug | PREFILLED_SYRINGE | INTRAMUSCULAR | Status: DC
  Administered 2021-12-13: 50 ug via INTRAVENOUS
  Filled 2021-12-13: qty 2

## 2021-12-13 MED ORDER — MIDAZOLAM HCL 2 MG/2ML IJ SOLN
2.0000 mg | INTRAMUSCULAR | Status: DC
  Filled 2021-12-13: qty 2

## 2021-12-13 MED ORDER — PHENYLEPHRINE 80 MCG/ML (10ML) SYRINGE FOR IV PUSH (FOR BLOOD PRESSURE SUPPORT)
PREFILLED_SYRINGE | INTRAVENOUS | Status: AC
  Filled 2021-12-13: qty 10

## 2021-12-13 MED ORDER — ORAL CARE MOUTH RINSE: 15.0000 mL | Freq: Once | OROMUCOSAL | Status: AC

## 2021-12-13 MED ORDER — PROPOFOL 500 MG/50ML IV EMUL
INTRAVENOUS | Status: DC | PRN
  Administered 2021-12-13: 100 ug/kg/min via INTRAVENOUS

## 2021-12-13 MED ORDER — HYDROMORPHONE HCL 1 MG/ML IJ SOLN: 0.2500 mg | INTRAMUSCULAR | Status: DC | PRN

## 2021-12-13 MED ORDER — 0.9 % SODIUM CHLORIDE (POUR BTL) OPTIME
TOPICAL | Status: DC | PRN
  Administered 2021-12-13: 1000 mL

## 2021-12-13 MED ORDER — ONDANSETRON HCL 4 MG/2ML IJ SOLN
INTRAMUSCULAR | Status: AC
  Filled 2021-12-13: qty 2

## 2021-12-13 MED ORDER — AMISULPRIDE (ANTIEMETIC) 5 MG/2ML IV SOLN: 10.0000 mg | Freq: Once | INTRAVENOUS | Status: DC | PRN

## 2021-12-13 MED ORDER — ROPIVACAINE HCL 5 MG/ML IJ SOLN
INTRAMUSCULAR | Status: DC | PRN
  Administered 2021-12-13: 30 mL via PERINEURAL

## 2021-12-13 MED ORDER — PHENYLEPHRINE 80 MCG/ML (10ML) SYRINGE FOR IV PUSH (FOR BLOOD PRESSURE SUPPORT)
PREFILLED_SYRINGE | INTRAVENOUS | Status: DC | PRN
  Administered 2021-12-13 (×3): 80 ug via INTRAVENOUS

## 2021-12-13 MED ORDER — PROPOFOL 10 MG/ML IV BOLUS
INTRAVENOUS | Status: DC | PRN
  Administered 2021-12-13: 20 mg via INTRAVENOUS

## 2021-12-13 SURGICAL SUPPLY — 33 items
BAG COUNTER SPONGE SURGICOUNT (BAG) IMPLANT
BAG SPNG CNTER NS LX DISP (BAG)
BNDG ELASTIC 4X5.8 VLCR STR LF (GAUZE/BANDAGES/DRESSINGS) ×1 IMPLANT
BNDG GAUZE DERMACEA FLUFF (GAUZE/BANDAGES/DRESSINGS) ×1
BNDG GAUZE DERMACEA FLUFF 4 (GAUZE/BANDAGES/DRESSINGS) IMPLANT
BNDG GZE DERMACEA 4 6PLY (GAUZE/BANDAGES/DRESSINGS) ×1
COVER MAYO STAND STRL (DRAPES) ×3 IMPLANT
COVER SURGICAL LIGHT HANDLE (MISCELLANEOUS) ×3 IMPLANT
CUFF TOURN SGL QUICK 18 (TOURNIQUET CUFF) ×1 IMPLANT
DRAPE C-ARM 42X120 X-RAY (DRAPES) ×2 IMPLANT
DRAPE EXTREMITY T 121X128X90 (DISPOSABLE) IMPLANT
DRAPE ORTHO 2.5IN SPLIT 77X108 (DRAPES) IMPLANT
DRAPE ORTHO SPLIT 77X108 STRL (DRAPES)
DRSG MEPILEX BORDER 4X8 (GAUZE/BANDAGES/DRESSINGS) IMPLANT
DRSG PAD ABDOMINAL 8X10 ST (GAUZE/BANDAGES/DRESSINGS) ×1 IMPLANT
GAUZE SPONGE 4X4 12PLY STRL (GAUZE/BANDAGES/DRESSINGS) ×1 IMPLANT
GAUZE XEROFORM 1X8 LF (GAUZE/BANDAGES/DRESSINGS) ×1 IMPLANT
GLOVE BIO SURGEON STRL SZ8.5 (GLOVE) ×3 IMPLANT
GLOVE BIOGEL M 6.5 STRL (GLOVE) ×1 IMPLANT
GLOVE BIOGEL M 8.0 STRL (GLOVE) ×1 IMPLANT
GLOVE BIOGEL PI IND STRL 7.0 (GLOVE) IMPLANT
GLOVE BIOGEL PI IND STRL 8 (GLOVE) IMPLANT
GLOVE BIOGEL PI INDICATOR 7.0 (GLOVE) ×1
GLOVE BIOGEL PI INDICATOR 8 (GLOVE) ×1
PACK GENERAL/GYN (CUSTOM PROCEDURE TRAY) ×2 IMPLANT
PACK ORTHO EXTREMITY (CUSTOM PROCEDURE TRAY) ×1 IMPLANT
PENCIL SMOKE EVACUATOR (MISCELLANEOUS) IMPLANT
PROTECTOR NERVE ULNAR (MISCELLANEOUS) ×3 IMPLANT
SUT ETHILON 2 0 PS N (SUTURE) ×1 IMPLANT
SUT MNCRL AB 3-0 PS2 18 (SUTURE) ×3 IMPLANT
SUT VIC AB 1 CT1 36 (SUTURE) ×3 IMPLANT
SUT VIC AB 2-0 CT1 27 (SUTURE) ×2
SUT VIC AB 2-0 CT1 TAPERPNT 27 (SUTURE) ×2 IMPLANT

## 2021-12-13 NOTE — Interval H&P Note (Signed)
History and Physical Interval Note:  12/13/2021 1:09 PM  [name]  has presented today for surgery, with the diagnosis of LEFT ANKLE INFECTED HARDWARE.  The various methods of treatment have been discussed with the patient and family. After consideration of risks, benefits and other options for treatment, the patient has consented to  Procedure(s): LEFT ANKLE HARDWARE REMOVAL (Left) as a surgical intervention.  The patient's history has been reviewed, patient examined, no change in status, stable for surgery.  I have reviewed the patient's chart and labs.  Questions were answered to the patient's satisfaction.     Hessie Dibble

## 2021-12-13 NOTE — Op Note (Unsigned)
NAMEJAZZLYN, [name] MEDICAL RECORD NO: [id number] ACCOUNT NO: [id number] DATE OF BIRTH: 05/09/1956 FACILITY: [facility] LOCATION: [facility] PHYSICIAN: Monico Blitz. Rhona Raider, MD  Operative Report   DATE OF PROCEDURE: 12/13/2021  PREOPERATIVE DIAGNOSES:  Left ankle painful infected hardware.  POSTOPERATIVE DIAGNOSIS:  Left ankle painful infected hardware.  PROCEDURE:  Left ankle hardware removal.  ANESTHESIA:  Block and MAC.  ATTENDING SURGEON:  Monico Blitz. Rhona Raider, MD  ASSISTANT:  None.  INDICATION FOR PROCEDURE:  The patient is a 66 year old woman who is a nursing home resident and non-ambulator.  She is many years out from ankle ORIF on both sides.  She has recently developed some breakdown on the lateral wound on her left foot.  She  also has a heel ulcer on the right.  She has exposed hardware on the left and is offered hardware removal and I and D on a semi-emergent basis.  Our hope is to eliminate this nidus of infection and hopefully get this wound to heal.  I did stress  extensively with the patient and her many family members that this was probably the first and many operations, which might ultimately lead to a below-knee amputation depending on ability of these wounds to heal and her vascular situation.  SUMMARY OF FINDINGS AND PROCEDURE:  Under block and sedation through an extension of her lateral aspect wound we removed a side plate and multiple screws.  The wound was then curetted and thoroughly irrigated.  I removed some devitalized tissues and best  to reapproximate things in a single layer with nylon.  Sterile dressing was applied and she was discharged back to the nursing home.  DESCRIPTION OF PROCEDURE:  The patient was taken to the operating suite where a block and sedation were administered.  She was positioned supine with a bump under the left hip.  She was prepped and draped in normal sterile fashion.  After the  administration of preoperative IV Kefzol and appropriate  time-out, the left leg was elevated, exsanguinated, and a tourniquet inflated about the calf.  She had two screw heads exposed on the lateral aspect of the ankle.  These were the two most distal  screws of her semi-tubular plate on the lateral malleolus.  I extended her old incision up proximally to the end of the plate and then removed multiple screws and the plate without much difficulty.  There did seem to be some infection about the distal 2  screws.  I curetted these holes and removed some devitalized tissue.  We irrigated the wound thoroughly several times.  I reapproximated the wound with 2-0 nylon in vertical mattress fashion.  We deflated the tourniquet and a small amount of bleeding was  easily controlled with some pressure.  I then placed Xeroform followed by dry gauze and a loose Ace wrap.  Estimated blood loss and intraoperative fluids can be obtained from anesthesia records as can accurate tourniquet time.  DISPOSITION:  The patient was taken to recovery room in stable condition.  Plans were for her to go back to her nursing care facility with followup in less than a week.   PUS D: 12/13/2021 2:28:32 pm T: 12/13/2021 8:15:00 pm  JOB: [id number]/ [id number]

## 2021-12-13 NOTE — Anesthesia Procedure Notes (Signed)
Anesthesia Regional Block: Popliteal block   Pre-Anesthetic Checklist: , timeout performed,  Correct Patient, Correct Site, Correct Laterality,  Correct Procedure, Correct Position, site marked,  Risks and benefits discussed,  Pre-op evaluation,  At surgeon's request and post-op pain management  Laterality: Lower and Left  Prep: Maximum Sterile Barrier Precautions used, chloraprep       Needles:  Injection technique: Single-shot  Needle Type: Echogenic Needle     Needle Length: 9cm  Needle Gauge: 21     Additional Needles:   Procedures:,,,, ultrasound used (permanent image in chart),,    Narrative:  Start time: 12/13/2021 1:01 PM End time: 12/13/2021 1:06 PM Injection made incrementally with aspirations every 5 mL.  Performed by: Personally  Anesthesiologist: Barnet Glasgow, MD  Additional Notes: Block assessed. Patient tolerated procedure well.

## 2021-12-13 NOTE — Transfer of Care (Signed)
Immediate Anesthesia Transfer of Care Note  Patient: [name]  Procedure(s) Performed: LEFT ANKLE HARDWARE REMOVAL (Left)  Patient Location: PACU  Anesthesia Type:MAC  Level of Consciousness: awake, alert  and oriented  Airway & Oxygen Therapy: Patient Spontanous Breathing and Patient connected to face mask oxygen  Post-op Assessment: Report given to RN and Post -op Vital signs reviewed and stable  Post vital signs: Reviewed and stable  Last Vitals:  Vitals Value Taken Time  BP 96/67 12/13/21 1424  Temp    Pulse 84 12/13/21 1427  Resp 16 12/13/21 1427  SpO2 100 % 12/13/21 1427  Vitals shown include unvalidated device data.  Last Pain:  Vitals:   12/13/21 1310  TempSrc:   PainSc: 5          Complications: No notable events documented.

## 2021-12-13 NOTE — Anesthesia Postprocedure Evaluation (Signed)
Anesthesia Post Note  Patient: [name]  Procedure(s) Performed: LEFT ANKLE HARDWARE REMOVAL (Left)     Patient location during evaluation: PACU Anesthesia Type: Regional Level of consciousness: awake and alert Pain management: pain level controlled Vital Signs Assessment: post-procedure vital signs reviewed and stable Respiratory status: spontaneous breathing, nonlabored ventilation, respiratory function stable and patient connected to nasal cannula oxygen Cardiovascular status: stable and blood pressure returned to baseline Postop Assessment: no apparent nausea or vomiting Anesthetic complications: no   No notable events documented.  Last Vitals:  Vitals:   12/13/21 1300 12/13/21 1310  BP: 113/67 113/67  Pulse:  97  Resp: 19 16  Temp:    SpO2: 95% 95%    Last Pain:  Vitals:   12/13/21 1310  TempSrc:   PainSc: 5                  Barnet Glasgow

## 2021-12-13 NOTE — Addendum Note (Signed)
Addendum  created 12/13/21 1642 by Barnet Glasgow, MD   Child order released for a procedure order, Clinical Note Signed, Intraprocedure Blocks edited, Intraprocedure Meds edited, SmartForm saved

## 2021-12-13 NOTE — Brief Op Note (Signed)
[name] [id number] 12/13/2021   PRE-OP DIAGNOSIS: left ankle infected hardware  POST-OP DIAGNOSIS: same  PROCEDURE: removal hardware left ankle  ANESTHESIA: block and [place]   Dictation #:  [id number]

## 2021-12-13 NOTE — Anesthesia Procedure Notes (Signed)
Procedure Name: MAC Date/Time: 12/13/2021 1:40 PM  Performed by: Maxwell Caul, CRNAPre-anesthesia Checklist: Patient identified, Emergency Drugs available, Suction available and Patient being monitored Oxygen Delivery Method: Simple face mask

## 2021-12-14 ENCOUNTER — Encounter (HOSPITAL_COMMUNITY): Payer: Self-pay | Admitting: Orthopaedic Surgery

## 2021-12-27 ENCOUNTER — Encounter (HOSPITAL_COMMUNITY): Payer: Self-pay

## 2021-12-28 ENCOUNTER — Emergency Department (HOSPITAL_COMMUNITY)
Admission: EM | Admit: 2021-12-28 | Discharge: 2021-12-29 | Disposition: A | Discharge status: Skilled Nursing Facility | Payer: Medicaid Other | Attending: Emergency Medicine | Admitting: Emergency Medicine

## 2021-12-28 ENCOUNTER — Emergency Department (HOSPITAL_COMMUNITY): Payer: Medicaid Other

## 2021-12-28 ENCOUNTER — Encounter (HOSPITAL_COMMUNITY): Payer: Self-pay

## 2021-12-28 DIAGNOSIS — M79604 Pain in right leg: Secondary | ICD-10-CM | POA: Diagnosis not present

## 2021-12-28 DIAGNOSIS — M7989 Other specified soft tissue disorders: Secondary | ICD-10-CM | POA: Insufficient documentation

## 2021-12-28 DIAGNOSIS — J181 Lobar pneumonia, unspecified organism: Secondary | ICD-10-CM | POA: Diagnosis not present

## 2021-12-28 DIAGNOSIS — E8809 Other disorders of plasma-protein metabolism, not elsewhere classified: Secondary | ICD-10-CM | POA: Diagnosis not present

## 2021-12-28 DIAGNOSIS — J189 Pneumonia, unspecified organism: Secondary | ICD-10-CM

## 2021-12-28 DIAGNOSIS — M79605 Pain in left leg: Secondary | ICD-10-CM | POA: Insufficient documentation

## 2021-12-28 LAB — CBC WITH DIFFERENTIAL/PLATELET
Abs Immature Granulocytes: 0.02 10*3/uL (ref 0.00–0.07)
Basophils Absolute: 0 10*3/uL (ref 0.0–0.1)
Basophils Relative: 0 %
Eosinophils Absolute: 0.2 10*3/uL (ref 0.0–0.5)
Eosinophils Relative: 3 %
HCT: 37.7 % (ref 36.0–46.0)
Hemoglobin: 11.5 g/dL — ABNORMAL LOW (ref 12.0–15.0)
Immature Granulocytes: 0 %
Lymphocytes Relative: 20 %
Lymphs Abs: 1.3 10*3/uL (ref 0.7–4.0)
MCH: 28.9 pg (ref 26.0–34.0)
MCHC: 30.5 g/dL (ref 30.0–36.0)
MCV: 94.7 fL (ref 80.0–100.0)
Monocytes Absolute: 0.4 10*3/uL (ref 0.1–1.0)
Monocytes Relative: 6 %
Neutro Abs: 4.7 10*3/uL (ref 1.7–7.7)
Neutrophils Relative %: 71 %
Platelets: 213 10*3/uL (ref 150–400)
RBC: 3.98 MIL/uL (ref 3.87–5.11)
RDW: 15.9 % — ABNORMAL HIGH (ref 11.5–15.5)
WBC: 6.7 10*3/uL (ref 4.0–10.5)
nRBC: 0 % (ref 0.0–0.2)

## 2021-12-28 LAB — COMPREHENSIVE METABOLIC PANEL
ALT: 12 U/L (ref 0–44)
AST: 12 U/L — ABNORMAL LOW (ref 15–41)
Albumin: 2.9 g/dL — ABNORMAL LOW (ref 3.5–5.0)
Alkaline Phosphatase: 65 U/L (ref 38–126)
Anion gap: 5 (ref 5–15)
BUN: 11 mg/dL (ref 8–23)
CO2: 28 mmol/L (ref 22–32)
Calcium: 9.1 mg/dL (ref 8.9–10.3)
Chloride: 107 mmol/L (ref 98–111)
Creatinine, Ser: 0.63 mg/dL (ref 0.44–1.00)
GFR, Estimated: >60 mL/min (ref >60)
Glucose, Bld: 137 mg/dL — ABNORMAL HIGH (ref 70–99)
Potassium: 3.8 mmol/L (ref 3.5–5.1)
Sodium: 140 mmol/L (ref 135–145)
Total Bilirubin: 0.3 mg/dL (ref 0.3–1.2)
Total Protein: 6.2 g/dL — ABNORMAL LOW (ref 6.5–8.1)

## 2021-12-28 LAB — PROTIME-INR
INR: 0.9 (ref 0.8–1.2)
Prothrombin Time: 11.8 seconds (ref 11.4–15.2)

## 2021-12-28 LAB — APTT: aPTT: 26 seconds (ref 24–36)

## 2021-12-28 NOTE — ED Triage Notes (Signed)
Patient  c/o pain all over body and  B/L lower extremities for several days. Swelling +1 b/l knee area. No pain medication taken today.

## 2021-12-29 LAB — URINALYSIS, ROUTINE W REFLEX MICROSCOPIC
Bilirubin Urine: NEGATIVE
Glucose, UA: NEGATIVE mg/dL
Ketones, ur: NEGATIVE mg/dL
Nitrite: NEGATIVE
Protein, ur: NEGATIVE mg/dL
Specific Gravity, Urine: 1.009 (ref 1.005–1.030)
pH: 7 (ref 5.0–8.0)

## 2021-12-29 MED ORDER — SODIUM CHLORIDE 0.9 % IV SOLN: 1.0000 g | Freq: Once | INTRAVENOUS | Status: DC

## 2021-12-29 MED ORDER — AZITHROMYCIN 250 MG PO TABS: 250.0000 mg | ORAL_TABLET | Freq: Every day | ORAL | 0 refills | Status: DC

## 2021-12-29 MED ORDER — OXYCODONE-ACETAMINOPHEN 5-325 MG PO TABS
1.0000 | ORAL_TABLET | Freq: Once | ORAL | Status: AC
  Administered 2021-12-29: 1 via ORAL
  Filled 2021-12-29: qty 1

## 2021-12-29 MED ORDER — MAGNESIUM CITRATE PO SOLN: 1.0000 | Freq: Once | ORAL | 0 refills | Status: AC

## 2021-12-29 MED ORDER — AZITHROMYCIN 250 MG PO TABS
500.0000 mg | ORAL_TABLET | Freq: Once | ORAL | Status: AC
  Administered 2021-12-29: 500 mg via ORAL
  Filled 2021-12-29: qty 2

## 2021-12-29 MED ORDER — CEFDINIR 300 MG PO CAPS: 300.0000 mg | ORAL_CAPSULE | Freq: Two times a day (BID) | ORAL | 0 refills | Status: DC

## 2021-12-29 MED ORDER — FUROSEMIDE 40 MG PO TABS
40.0000 mg | ORAL_TABLET | Freq: Once | ORAL | Status: AC
Start: 2021-12-29 — End: 2021-12-29
  Administered 2021-12-29: 40 mg via ORAL
  Filled 2021-12-29: qty 1

## 2021-12-29 NOTE — ED Provider Notes (Signed)
[facility] EMERGENCY DEPARTMENT Provider Note   CSN: [id number] Arrival date & time: 12/28/21  2233     History  Chief Complaint  Patient presents with   Leg Pain    [name] is a 66 y.o. female.  Patient presents to the emergency department for evaluation of bilateral lower extremity pain, swelling.  Patient reports that she had hardware removed from her left ankle recently and had the staples removed.  She is having pain at the site, is worried that the incision site is infected.  Patient reports that she is currently residing in a nursing home, has not walked in 6 months.       Home Medications Prior to Admission medications   Medication Sig Start Date End Date Taking? Authorizing Provider  acetaminophen (TYLENOL) 325 MG tablet Take 650 mg by mouth every 4 (four) hours as needed for mild pain.    [provider]  ascorbic acid (VITAMIN C) 500 MG tablet Take 500 mg by mouth 2 (two) times daily.    [provider]  folic acid (FOLVITE) 1 MG tablet Take 1 mg by mouth daily.    [provider]  gabapentin (NEURONTIN) 100 MG capsule Take 300 mg by mouth 3 (three) times daily. 07/20/21   [provider]  HYDROcodone-acetaminophen (NORCO/VICODIN) 5-325 MG tablet Take 1 tablet by mouth every 6 (six) hours as needed for moderate pain.    [provider]  lidocaine (LIDODERM) 5 % Place 1 patch onto the skin daily. Remove & Discard patch within 12 hours or as directed by MD; Apply to [place]    [provider]  lithium 300 MG tablet Take 300-600 mg by mouth See admin instructions. 300mg  in the am (0800), 600mg  in th evening (1800) 07/20/21   [provider]  Melatonin 10 MG TABS Take 10 mg by mouth at bedtime.    [provider]  midodrine (PROAMATINE) 10 MG tablet Take 1 tablet (10 mg total) by mouth 3 (three) times daily with meals. 08/03/21   Orson Eva, MD  Multiple Vitamin (MULTIVITAMIN ADULT PO) Take 1 tablet  by mouth daily.    [provider]  Nutritional Supplements (NUTRITIONAL SUPPLEMENT PO) Take 120 mLs by mouth in the morning, at noon, and at bedtime. Wound Healing    [provider]  QUEtiapine (SEROQUEL) 100 MG tablet Take 100 mg by mouth at bedtime. 07/20/21   [provider]  QUEtiapine (SEROQUEL) 25 MG tablet Take 25 mg by mouth every morning.    [provider]  saccharomyces boulardii (FLORASTOR) 250 MG capsule Take 250 mg by mouth daily.    [provider]  tamsulosin (FLOMAX) 0.4 MG CAPS capsule Take 0.4 mg by mouth daily.    [provider]      Allergies    Sulfa antibiotics    Review of Systems   Review of Systems  Physical Exam Updated Vital Signs BP (!) 107/46 (BP Location: Left Arm)   Pulse 84   Temp 97.8 F (36.6 C) (Oral)   Resp 16   Ht 5\' 7"  (1.702 m)   Wt 64.9 kg   SpO2 97%   BMI 22.40 kg/m  Physical Exam Vitals and nursing note reviewed.  Constitutional:      General: She is not in acute distress.    Appearance: She is well-developed.  HENT:     Head: Normocephalic and atraumatic.     Mouth/Throat:     Mouth: Mucous  membranes are moist.  Eyes:     General: Vision grossly intact. Gaze aligned appropriately.     Extraocular Movements: Extraocular movements intact.     Conjunctiva/sclera: Conjunctivae normal.  Cardiovascular:     Rate and Rhythm: Normal rate and regular rhythm.     Pulses: Normal pulses.     Heart sounds: Normal heart sounds, S1 normal and S2 normal. No murmur heard.    No friction rub. No gallop.  Pulmonary:     Effort: Pulmonary effort is normal. No respiratory distress.     Breath sounds: Normal breath sounds.  Abdominal:     General: Bowel sounds are normal.     Palpations: Abdomen is soft.     Tenderness: There is no abdominal tenderness. There is no guarding or rebound.     Hernia: No hernia is present.  Musculoskeletal:        General: No swelling.     Cervical back:  Full passive range of motion without pain, normal range of motion and neck supple. No spinous process tenderness or muscular tenderness. Normal range of motion.     Right lower leg: 1+ Edema present.     Left lower leg: 1+ Edema present.  Skin:    General: Skin is warm and dry.     Capillary Refill: Capillary refill takes less than 2 seconds.     Findings: Wound (Inferior aspect of prior ankle incision has sequela of dehiscence but granulation tissue present, no erythema, induration, drainage or signs of infection) present. No ecchymosis or rash.  Neurological:     General: No focal deficit present.     Mental Status: She is alert and oriented to person, place, and time.     GCS: GCS eye subscore is 4. GCS verbal subscore is 5. GCS motor subscore is 6.     Cranial Nerves: Cranial nerves 2-12 are intact.     Sensory: Sensation is intact.     Motor: Motor function is intact.     Coordination: Coordination is intact.  Psychiatric:        Attention and Perception: Attention normal.        Mood and Affect: Mood normal.        Speech: Speech normal.        Behavior: Behavior normal.     ED Results / Procedures / Treatments   Labs (all labs ordered are listed, but only abnormal results are displayed) Labs Reviewed  COMPREHENSIVE METABOLIC PANEL - Abnormal; Notable for the following components:      Result Value   Glucose, Bld 137 (*)    Total Protein 6.2 (*)    Albumin 2.9 (*)    AST 12 (*)    All other components within normal limits  CBC WITH DIFFERENTIAL/PLATELET - Abnormal; Notable for the following components:   Hemoglobin 11.5 (*)    RDW 15.9 (*)    All other components within normal limits  URINALYSIS, ROUTINE W REFLEX MICROSCOPIC - Abnormal; Notable for the following components:   APPearance HAZY (*)    Hgb urine dipstick SMALL (*)    Leukocytes,Ua TRACE (*)    Bacteria, UA RARE (*)    All other components within normal limits  PROTIME-INR  APTT     EKG None  Radiology DG Chest Port 1 View  Result Date: 12/28/2021 CLINICAL DATA:  Body pain, history of lung cancer and right upper lobe EXAM: PORTABLE CHEST 1 VIEW COMPARISON:  Radiographs 07/27/2021 FINDINGS: Postoperative changes right hilum  and right upper lung. Mild pulmonary vascular congestion. New airspace opacity in the left lower lung. Normal cardiomediastinal silhouette. No acute osseous abnormality. IMPRESSION: New focal airspace opacity in the left lower lung may represent atelectasis though pneumonia is not excluded. Follow-up in 4-6 weeks is recommended to ensure resolution. Electronically Signed   By: Placido Sou M.D.   On: 12/28/2021 23:38    Procedures Procedures    Medications Ordered in ED Medications - No data to display  ED Course/ Medical Decision Making/ A&P                           Medical Decision Making  Patient presents to the emergency department with multiple complaints.  Many of her complaints seem to be somewhat chronic in nature.  She reports pain all over.  Patient indicates that she is not getting her pain medication at the nursing home.  She has swelling of both of her legs.  She reports that they have been "working on it".  She does not appear to be on any diuretics currently, however.  No evidence of congestive heart failure.  Lungs do not show pulmonary edema but there is concern for possible pneumonia in the left lower lung field.  Lab work is unremarkable.  No signs of urinary tract infection.  No leukocytosis.  No fever, hypotension.  She is not hypoxic.  Swelling of her legs may be secondary to low protein and albumin. No proteinuria on UA.  At this point I do not find any results in her work-up that would indicate that she requires hospitalization, as she is in a skilled nursing facility.  We will treat pneumonia, give a dose of Lasix.  Patient complaining of pain, given analgesia.  She feels like she is constipated, abdominal exam is  benign.        Final Clinical Impression(s) / ED Diagnoses Final diagnoses:  Leg swelling  Hypoalbuminemia  Community acquired pneumonia of left lower lobe of lung    Rx / DC Orders ED Discharge Orders     None         Eriko Economos, Gwenyth Allegra, MD 12/29/21 [phone]

## 2022-01-02 ENCOUNTER — Encounter: Payer: Self-pay | Admitting: Surgery

## 2022-01-02 ENCOUNTER — Ambulatory Visit (HOSPITAL_COMMUNITY)
Admission: RE | Admit: 2022-01-02 | Discharge: 2022-01-02 | Disposition: A | Discharge status: Home / Self Care | Payer: Medicaid Other | Source: Ambulatory Visit | Attending: Surgery | Admitting: Surgery

## 2022-01-02 ENCOUNTER — Ambulatory Visit (INDEPENDENT_AMBULATORY_CARE_PROVIDER_SITE_OTHER): Payer: Medicaid Other | Admitting: Surgery

## 2022-01-02 ENCOUNTER — Other Ambulatory Visit: Payer: Self-pay | Admitting: *Deleted

## 2022-01-02 VITALS — BP 106/75 | HR 94 | Temp 98.1°F | Resp 20 | Ht 67.0 in | Wt 143.0 lb

## 2022-01-02 DIAGNOSIS — M79605 Pain in left leg: Secondary | ICD-10-CM

## 2022-01-02 DIAGNOSIS — I7025 Atherosclerosis of native arteries of other extremities with ulceration: Secondary | ICD-10-CM | POA: Diagnosis not present

## 2022-01-02 NOTE — Progress Notes (Signed)
[facility] [place]  Patient name: [name] MRN: [id number] DOB: 1956-02-23 Sex: female   REQUESTING PROVIDER:    Dr. Rhona Raider   REASON FOR CONSULT:    Poor wound healing  HISTORY OF PRESENT ILLNESS:   [name] is a 66 y.o. female, who is referred for a nonhealing wound on her left leg.  She recently had exposed hardware from a remote ankle fracture repair which required I&D.  This was done on 12/13/2021.  She is having continuous pain and a wound that has been slow to heal.  She is sent for vascular evaluation.  The patient is nonambulatory and lives in a nursing facility.  She is on antibiotics  She is bipolar and has a history of drug abuse.  She is status post lung cancer resection in 2011.  She is a former smoker.  PAST MEDICAL HISTORY    Past Medical History:  Diagnosis Date   Allergy    Bipolar disorder ([place])    Depression    Drug abuse ([place])    history of, went to rehab 2015   GERD (gastroesophageal reflux disease)    Lung cancer ([place])    lung ca dx 11/11- right upper lobe     FAMILY HISTORY   Family History  Problem Relation Age of Onset   Cancer Mother        breast cancer   Cancer Father        stomach cancer   Diabetes Father    Cancer Sister        breast cancer   Diabetes Brother    Diabetes Brother    Cancer Sister        breast cancer    SOCIAL HISTORY:   Social History   Socioeconomic History   Marital status: Single    Spouse name: Not on file   Number of children: Not on file   Years of education: Not on file   Highest education level: Not on file  Occupational History   Not on file  Tobacco Use   Smoking status: Former    Packs/day: 1.00    Years: 40.00    Total pack years: 40.00    Types: Cigarettes   Smokeless tobacco: Never  Vaping Use   Vaping Use: Former  Substance and Sexual Activity   Alcohol use: Not Currently    Comment: hx alcoholism.  none  since 2017   Drug use: Not Currently    Types: Benzodiazepines    Comment: hx of xanax abuse. last use 2015   Sexual activity: Not on file  Other Topics Concern   Not on file  Social History Narrative   Not on file   Social Determinants of Health   Financial Resource Strain: Not on file  Food Insecurity: Not on file  Transportation Needs: Not on file  Physical Activity: Not on file  Stress: Not on file  Social Connections: Not on file  Intimate Partner Violence: Not on file    ALLERGIES:    Allergies  Allergen Reactions   Sulfa Antibiotics Swelling and Rash    Neck swelling    CURRENT MEDICATIONS:    Current Outpatient Medications  Medication Sig Dispense Refill   acetaminophen (TYLENOL) 325 MG tablet Take 650 mg by mouth every 4 (four) hours as needed for mild pain.     ascorbic acid (VITAMIN C) 500 MG tablet Take 500 mg by mouth 2 (two) times daily.  azithromycin (ZITHROMAX) 250 MG tablet Take 1 tablet (250 mg total) by mouth daily. 4 tablet 0   cefdinir (OMNICEF) 300 MG capsule Take 1 capsule (300 mg total) by mouth 2 (two) times daily. 20 capsule 0   folic acid (FOLVITE) 1 MG tablet Take 1 mg by mouth daily.     gabapentin (NEURONTIN) 100 MG capsule Take 300 mg by mouth 3 (three) times daily.     HYDROcodone-acetaminophen (NORCO/VICODIN) 5-325 MG tablet Take 1 tablet by mouth every 6 (six) hours as needed for moderate pain.     lidocaine (LIDODERM) 5 % Place 1 patch onto the skin daily. Remove & Discard patch within 12 hours or as directed by MD; Apply to LOWER LEG     lithium 300 MG tablet Take 300-600 mg by mouth See admin instructions. 300mg  in the am (0800), 600mg  in th evening (1800)     Melatonin 10 MG TABS Take 10 mg by mouth at bedtime.     midodrine (PROAMATINE) 10 MG tablet Take 1 tablet (10 mg total) by mouth 3 (three) times daily with meals. 90 tablet    Multiple Vitamin (MULTIVITAMIN ADULT PO) Take 1 tablet by mouth daily.     Nutritional Supplements  (NUTRITIONAL SUPPLEMENT PO) Take 120 mLs by mouth in the morning, at noon, and at bedtime. Wound Healing     QUEtiapine (SEROQUEL) 100 MG tablet Take 100 mg by mouth at bedtime.     QUEtiapine (SEROQUEL) 25 MG tablet Take 25 mg by mouth every morning.     saccharomyces boulardii (FLORASTOR) 250 MG capsule Take 250 mg by mouth daily.     tamsulosin (FLOMAX) 0.4 MG CAPS capsule Take 0.4 mg by mouth daily.     No current facility-administered medications for this visit.    REVIEW OF SYSTEMS:   [X]  denotes positive finding, [ ]  denotes negative finding Cardiac  Comments:  Chest pain or chest pressure:    Shortness of breath upon exertion:    Short of breath when lying flat:    Irregular heart rhythm:        Vascular    Pain in calf, thigh, or hip brought on by ambulation:    Pain in feet at night that wakes you up from your sleep:     Blood clot in your veins:    Leg swelling:         Pulmonary    Oxygen at home:    Productive cough:     Wheezing:         Neurologic    Sudden weakness in arms or legs:     Sudden numbness in arms or legs:     Sudden onset of difficulty speaking or slurred speech:    Temporary loss of vision in one eye:     Problems with dizziness:         Gastrointestinal    Blood in stool:      Vomited blood:         Genitourinary    Burning when urinating:     Blood in urine:        Psychiatric    Major depression:         Hematologic    Bleeding problems:    Problems with blood clotting too easily:        Skin    Rashes or ulcers: x       Constitutional    Fever or chills:     PHYSICAL EXAM:  Vitals:   01/02/22 1455  BP: 106/75  Pulse: 94  Resp: 20  Temp: 98.1 F (36.7 C)  SpO2: 95%  Weight: 143 lb (64.9 kg)  Height: 5\' 7"  (1.702 m)    GENERAL: The patient is a well-nourished female, in no acute distress. The vital signs are documented above. CARDIAC: There is a regular rate and rhythm.  VASCULAR: Nonpalpable pedal pulses.   Lower extremity edema PULMONARY: Nonlabored respirations ABDOMEN: Soft and non-tender with normal pitched bowel sounds.  MUSCULOSKELETAL: There are no major deformities or cyanosis. NEUROLOGIC: No focal weakness or paresthesias are detected. SKIN: See photos below PSYCHIATRIC: The patient has a normal affect.       STUDIES:   I have reviewed the following: ABI Findings:  +---------+------------------+-----+-----------+----------------+  Right    Rt Pressure (mmHg)IndexWaveform   Comment           +---------+------------------+-----+-----------+----------------+  Brachial 115                    triphasic                    +---------+------------------+-----+-----------+----------------+  PTA      0                 0.00 absent     unable to detect  +---------+------------------+-----+-----------+----------------+  DP       139               1.21 multiphasic                  +---------+------------------+-----+-----------+----------------+  Great Toe113               0.98 Normal                       +---------+------------------+-----+-----------+----------------+   +---------+------------------+-----+-------------------+-------+  Left     Lt Pressure (mmHg)IndexWaveform           Comment  +---------+------------------+-----+-------------------+-------+  Brachial 112                    triphasic                   +---------+------------------+-----+-------------------+-------+  PTA      136               1.18 dampened monophasic         +---------+------------------+-----+-------------------+-------+  DP       162               1.41 monophasic                  +---------+------------------+-----+-------------------+-------+  Great Toe132               1.15 Normal                      +---------+------------------+-----+-------------------+-------+   ASSESSMENT and PLAN   Bilateral lower extremity ulcers in the  setting of atherosclerotic vascular disease: The patient has a ultrasound with her that suggest left popliteal and superficial femoral artery occlusion as well as iliac disease bilaterally.  Ultrasound today shows monophasic waveforms on the left with elevated toe pressures and tibial pressures secondary to medial calcification.  Therefore I do not think the absolute numbers on her ABIs are accurate.  I think she has severe atherosclerotic vascular disease and is at risk for amputation if we cannot improve her blood flow to heal  her wounds.  I recommended proceeding with angiography.  I offered doing this tomorrow or later in the week.  She does have transportation issues and has requested that we do it on Tuesday, August 29.  I will plan on a right femoral access, I will study both legs and intervene on the left if indicated.   Leia Alf, MD, FACS [facility] [phone] Pager [phone]

## 2022-01-02 NOTE — H&P (View-Only) (Signed)
[facility] [place]  Patient name: [name] MRN: [id number] DOB: Nov 25, 1955 Sex: female   REQUESTING PROVIDER:    Dr. Rhona Raider   REASON FOR CONSULT:    Poor wound healing  HISTORY OF PRESENT ILLNESS:   [name] is a 66 y.o. female, who is referred for a nonhealing wound on her left leg.  She recently had exposed hardware from a remote ankle fracture repair which required I&D.  This was done on 12/13/2021.  She is having continuous pain and a wound that has been slow to heal.  She is sent for vascular evaluation.  The patient is nonambulatory and lives in a nursing facility.  She is on antibiotics  She is bipolar and has a history of drug abuse.  She is status post lung cancer resection in 2011.  She is a former smoker.  PAST MEDICAL HISTORY    Past Medical History:  Diagnosis Date   Allergy    Bipolar disorder ([place])    Depression    Drug abuse ([place])    history of, went to rehab 2015   GERD (gastroesophageal reflux disease)    Lung cancer ([place])    lung ca dx 11/11- right upper lobe     FAMILY HISTORY   Family History  Problem Relation Age of Onset   Cancer Mother        breast cancer   Cancer Father        stomach cancer   Diabetes Father    Cancer Sister        breast cancer   Diabetes Brother    Diabetes Brother    Cancer Sister        breast cancer    SOCIAL HISTORY:   Social History   Socioeconomic History   Marital status: Single    Spouse name: Not on file   Number of children: Not on file   Years of education: Not on file   Highest education level: Not on file  Occupational History   Not on file  Tobacco Use   Smoking status: Former    Packs/day: 1.00    Years: 40.00    Total pack years: 40.00    Types: Cigarettes   Smokeless tobacco: Never  Vaping Use   Vaping Use: Former  Substance and Sexual Activity   Alcohol use: Not Currently    Comment: hx alcoholism.  none  since 2017   Drug use: Not Currently    Types: Benzodiazepines    Comment: hx of xanax abuse. last use 2015   Sexual activity: Not on file  Other Topics Concern   Not on file  Social History Narrative   Not on file   Social Determinants of Health   Financial Resource Strain: Not on file  Food Insecurity: Not on file  Transportation Needs: Not on file  Physical Activity: Not on file  Stress: Not on file  Social Connections: Not on file  Intimate Partner Violence: Not on file    ALLERGIES:    Allergies  Allergen Reactions   Sulfa Antibiotics Swelling and Rash    Neck swelling    CURRENT MEDICATIONS:    Current Outpatient Medications  Medication Sig Dispense Refill   acetaminophen (TYLENOL) 325 MG tablet Take 650 mg by mouth every 4 (four) hours as needed for mild pain.     ascorbic acid (VITAMIN C) 500 MG tablet Take 500 mg by mouth 2 (two) times daily.  azithromycin (ZITHROMAX) 250 MG tablet Take 1 tablet (250 mg total) by mouth daily. 4 tablet 0   cefdinir (OMNICEF) 300 MG capsule Take 1 capsule (300 mg total) by mouth 2 (two) times daily. 20 capsule 0   folic acid (FOLVITE) 1 MG tablet Take 1 mg by mouth daily.     gabapentin (NEURONTIN) 100 MG capsule Take 300 mg by mouth 3 (three) times daily.     HYDROcodone-acetaminophen (NORCO/VICODIN) 5-325 MG tablet Take 1 tablet by mouth every 6 (six) hours as needed for moderate pain.     lidocaine (LIDODERM) 5 % Place 1 patch onto the skin daily. Remove & Discard patch within 12 hours or as directed by MD; Apply to LOWER LEG     lithium 300 MG tablet Take 300-600 mg by mouth See admin instructions. 300mg  in the am (0800), 600mg  in th evening (1800)     Melatonin 10 MG TABS Take 10 mg by mouth at bedtime.     midodrine (PROAMATINE) 10 MG tablet Take 1 tablet (10 mg total) by mouth 3 (three) times daily with meals. 90 tablet    Multiple Vitamin (MULTIVITAMIN ADULT PO) Take 1 tablet by mouth daily.     Nutritional Supplements  (NUTRITIONAL SUPPLEMENT PO) Take 120 mLs by mouth in the morning, at noon, and at bedtime. Wound Healing     QUEtiapine (SEROQUEL) 100 MG tablet Take 100 mg by mouth at bedtime.     QUEtiapine (SEROQUEL) 25 MG tablet Take 25 mg by mouth every morning.     saccharomyces boulardii (FLORASTOR) 250 MG capsule Take 250 mg by mouth daily.     tamsulosin (FLOMAX) 0.4 MG CAPS capsule Take 0.4 mg by mouth daily.     No current facility-administered medications for this visit.    REVIEW OF SYSTEMS:   [X]  denotes positive finding, [ ]  denotes negative finding Cardiac  Comments:  Chest pain or chest pressure:    Shortness of breath upon exertion:    Short of breath when lying flat:    Irregular heart rhythm:        Vascular    Pain in calf, thigh, or hip brought on by ambulation:    Pain in feet at night that wakes you up from your sleep:     Blood clot in your veins:    Leg swelling:         Pulmonary    Oxygen at home:    Productive cough:     Wheezing:         Neurologic    Sudden weakness in arms or legs:     Sudden numbness in arms or legs:     Sudden onset of difficulty speaking or slurred speech:    Temporary loss of vision in one eye:     Problems with dizziness:         Gastrointestinal    Blood in stool:      Vomited blood:         Genitourinary    Burning when urinating:     Blood in urine:        Psychiatric    Major depression:         Hematologic    Bleeding problems:    Problems with blood clotting too easily:        Skin    Rashes or ulcers: x       Constitutional    Fever or chills:     PHYSICAL EXAM:  Vitals:   01/02/22 1455  BP: 106/75  Pulse: 94  Resp: 20  Temp: 98.1 F (36.7 C)  SpO2: 95%  Weight: 143 lb (64.9 kg)  Height: 5\' 7"  (1.702 m)    GENERAL: The patient is a well-nourished female, in no acute distress. The vital signs are documented above. CARDIAC: There is a regular rate and rhythm.  VASCULAR: Nonpalpable pedal pulses.   Lower extremity edema PULMONARY: Nonlabored respirations ABDOMEN: Soft and non-tender with normal pitched bowel sounds.  MUSCULOSKELETAL: There are no major deformities or cyanosis. NEUROLOGIC: No focal weakness or paresthesias are detected. SKIN: See photos below PSYCHIATRIC: The patient has a normal affect.       STUDIES:   I have reviewed the following: ABI Findings:  +---------+------------------+-----+-----------+----------------+  Right    Rt Pressure (mmHg)IndexWaveform   Comment           +---------+------------------+-----+-----------+----------------+  Brachial 115                    triphasic                    +---------+------------------+-----+-----------+----------------+  PTA      0                 0.00 absent     unable to detect  +---------+------------------+-----+-----------+----------------+  DP       139               1.21 multiphasic                  +---------+------------------+-----+-----------+----------------+  Great Toe113               0.98 Normal                       +---------+------------------+-----+-----------+----------------+   +---------+------------------+-----+-------------------+-------+  Left     Lt Pressure (mmHg)IndexWaveform           Comment  +---------+------------------+-----+-------------------+-------+  Brachial 112                    triphasic                   +---------+------------------+-----+-------------------+-------+  PTA      136               1.18 dampened monophasic         +---------+------------------+-----+-------------------+-------+  DP       162               1.41 monophasic                  +---------+------------------+-----+-------------------+-------+  Great Toe132               1.15 Normal                      +---------+------------------+-----+-------------------+-------+   ASSESSMENT and PLAN   Bilateral lower extremity ulcers in the  setting of atherosclerotic vascular disease: The patient has a ultrasound with her that suggest left popliteal and superficial femoral artery occlusion as well as iliac disease bilaterally.  Ultrasound today shows monophasic waveforms on the left with elevated toe pressures and tibial pressures secondary to medial calcification.  Therefore I do not think the absolute numbers on her ABIs are accurate.  I think she has severe atherosclerotic vascular disease and is at risk for amputation if we cannot improve her blood flow to heal  her wounds.  I recommended proceeding with angiography.  I offered doing this tomorrow or later in the week.  She does have transportation issues and has requested that we do it on Tuesday, August 29.  I will plan on a right femoral access, I will study both legs and intervene on the left if indicated.   Leia Alf, MD, FACS [facility] [phone] Pager [phone]

## 2022-01-03 ENCOUNTER — Other Ambulatory Visit: Payer: Self-pay

## 2022-01-03 DIAGNOSIS — I7025 Atherosclerosis of native arteries of other extremities with ulceration: Secondary | ICD-10-CM

## 2022-01-10 ENCOUNTER — Encounter (HOSPITAL_COMMUNITY)
Admission: RE | Disposition: A | Discharge status: Home / Self Care | Payer: Self-pay | Source: Ambulatory Visit | Attending: Surgery

## 2022-01-10 ENCOUNTER — Ambulatory Visit (HOSPITAL_COMMUNITY)
Admission: RE | Admit: 2022-01-10 | Discharge: 2022-01-10 | Disposition: A | Discharge status: Home / Self Care | Payer: Medicaid Other | Source: Ambulatory Visit | Attending: Surgery | Admitting: Surgery

## 2022-01-10 ENCOUNTER — Other Ambulatory Visit: Payer: Self-pay

## 2022-01-10 DIAGNOSIS — I70248 Atherosclerosis of native arteries of left leg with ulceration of other part of lower left leg: Secondary | ICD-10-CM | POA: Insufficient documentation

## 2022-01-10 DIAGNOSIS — F319 Bipolar disorder, unspecified: Secondary | ICD-10-CM | POA: Diagnosis not present

## 2022-01-10 DIAGNOSIS — L97929 Non-pressure chronic ulcer of unspecified part of left lower leg with unspecified severity: Secondary | ICD-10-CM | POA: Insufficient documentation

## 2022-01-10 DIAGNOSIS — Z87891 Personal history of nicotine dependence: Secondary | ICD-10-CM | POA: Diagnosis not present

## 2022-01-10 DIAGNOSIS — I70249 Atherosclerosis of native arteries of left leg with ulceration of unspecified site: Secondary | ICD-10-CM | POA: Diagnosis not present

## 2022-01-10 DIAGNOSIS — I7025 Atherosclerosis of native arteries of other extremities with ulceration: Secondary | ICD-10-CM

## 2022-01-10 HISTORY — PX: ABDOMINAL AORTOGRAM W/LOWER EXTREMITY: CATH118223

## 2022-01-10 LAB — POCT I-STAT, CHEM 8
BUN: 8 mg/dL (ref 8–23)
Calcium, Ion: 1.31 mmol/L (ref 1.15–1.40)
Chloride: 103 mmol/L (ref 98–111)
Creatinine, Ser: 0.5 mg/dL (ref 0.44–1.00)
Glucose, Bld: 131 mg/dL — ABNORMAL HIGH (ref 70–99)
HCT: 43 % (ref 36.0–46.0)
Hemoglobin: 14.6 g/dL (ref 12.0–15.0)
Potassium: 3.7 mmol/L (ref 3.5–5.1)
Sodium: 141 mmol/L (ref 135–145)
TCO2: 27 mmol/L (ref 22–32)

## 2022-01-10 SURGERY — ABDOMINAL AORTOGRAM W/LOWER EXTREMITY
Anesthesia: LOCAL | Laterality: Bilateral

## 2022-01-10 MED ORDER — SODIUM CHLORIDE 0.9 % IV SOLN: INTRAVENOUS | Status: DC

## 2022-01-10 MED ORDER — LIDOCAINE HCL (PF) 1 % IJ SOLN
INTRAMUSCULAR | Status: DC | PRN
  Administered 2022-01-10: 15 mL

## 2022-01-10 MED ORDER — ACETAMINOPHEN 325 MG PO TABS: 650.0000 mg | ORAL_TABLET | ORAL | Status: DC | PRN

## 2022-01-10 MED ORDER — SODIUM CHLORIDE 0.9 % IV SOLN: 250.0000 mL | INTRAVENOUS | Status: DC | PRN

## 2022-01-10 MED ORDER — SODIUM CHLORIDE 0.9% FLUSH: 3.0000 mL | Freq: Two times a day (BID) | INTRAVENOUS | Status: DC

## 2022-01-10 MED ORDER — LIDOCAINE HCL (PF) 1 % IJ SOLN
INTRAMUSCULAR | Status: AC
  Filled 2022-01-10: qty 30

## 2022-01-10 MED ORDER — HEPARIN (PORCINE) IN NACL 1000-0.9 UT/500ML-% IV SOLN
INTRAVENOUS | Status: AC
  Filled 2022-01-10: qty 1000

## 2022-01-10 MED ORDER — SODIUM CHLORIDE 0.9 % WEIGHT BASED INFUSION: 1.0000 mL/kg/h | INTRAVENOUS | Status: DC

## 2022-01-10 MED ORDER — OXYCODONE HCL 5 MG PO TABS: 5.0000 mg | ORAL_TABLET | ORAL | Status: DC | PRN

## 2022-01-10 MED ORDER — IODIXANOL 320 MG/ML IV SOLN
INTRAVENOUS | Status: DC | PRN
  Administered 2022-01-10: 100 mL

## 2022-01-10 MED ORDER — MIDAZOLAM HCL 2 MG/2ML IJ SOLN
INTRAMUSCULAR | Status: AC
  Filled 2022-01-10: qty 2

## 2022-01-10 MED ORDER — SODIUM CHLORIDE 0.9% FLUSH: 3.0000 mL | INTRAVENOUS | Status: DC | PRN

## 2022-01-10 MED ORDER — HEPARIN (PORCINE) IN NACL 1000-0.9 UT/500ML-% IV SOLN
INTRAVENOUS | Status: DC | PRN
  Administered 2022-01-10 (×2): 500 mL

## 2022-01-10 MED ORDER — LABETALOL HCL 5 MG/ML IV SOLN: 10.0000 mg | INTRAVENOUS | Status: DC | PRN

## 2022-01-10 MED ORDER — ONDANSETRON HCL 4 MG/2ML IJ SOLN
4.0000 mg | Freq: Four times a day (QID) | INTRAMUSCULAR | Status: DC | PRN
  Administered 2022-01-10: 4 mg via INTRAVENOUS
  Filled 2022-01-10: qty 2

## 2022-01-10 MED ORDER — HYDRALAZINE HCL 20 MG/ML IJ SOLN: 5.0000 mg | INTRAMUSCULAR | Status: DC | PRN

## 2022-01-10 MED ORDER — FENTANYL CITRATE (PF) 100 MCG/2ML IJ SOLN
INTRAMUSCULAR | Status: DC | PRN
Start: 2022-01-10 — End: 2022-01-10
  Administered 2022-01-10 (×2): 25 ug via INTRAVENOUS
  Administered 2022-01-10: 50 ug via INTRAVENOUS

## 2022-01-10 MED ORDER — FENTANYL CITRATE (PF) 100 MCG/2ML IJ SOLN
INTRAMUSCULAR | Status: AC
  Filled 2022-01-10: qty 2

## 2022-01-10 MED ORDER — MORPHINE SULFATE (PF) 2 MG/ML IV SOLN: 2.0000 mg | INTRAVENOUS | Status: DC | PRN

## 2022-01-10 MED ORDER — MIDAZOLAM HCL 2 MG/2ML IJ SOLN
INTRAMUSCULAR | Status: DC | PRN
  Administered 2022-01-10: 1 mg via INTRAVENOUS
  Administered 2022-01-10: 2 mg via INTRAVENOUS
  Administered 2022-01-10: 1 mg via INTRAVENOUS

## 2022-01-10 MED ORDER — HEPARIN (PORCINE) IN NACL 1000-0.9 UT/500ML-% IV SOLN
INTRAVENOUS | Status: AC
Start: 2022-01-10 — End: ?
  Filled 2022-01-10: qty 1000

## 2022-01-10 SURGICAL SUPPLY — 10 items
CATH OMNI FLUSH 5F 65CM (CATHETERS) IMPLANT
DEVICE VASC CLSR CELT ART 5 (Vascular Products) IMPLANT
KIT MICROPUNCTURE NIT STIFF (SHEATH) IMPLANT
KIT PV (KITS) ×2 IMPLANT
SHEATH PINNACLE 5F 10CM (SHEATH) IMPLANT
SHEATH PROBE COVER 6X72 (BAG) IMPLANT
SYR MEDRAD MARK V 150ML (SYRINGE) IMPLANT
TRANSDUCER W/STOPCOCK (MISCELLANEOUS) ×2 IMPLANT
TRAY PV CATH (CUSTOM PROCEDURE TRAY) ×2 IMPLANT
WIRE BENTSON .035X145CM (WIRE) IMPLANT

## 2022-01-10 NOTE — Interval H&P Note (Signed)
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  The various methods of treatment have been discussed with the patient and family. After consideration of risks, benefits and other options for treatment, the patient has consented to  Procedure(s): ABDOMINAL AORTOGRAM W/LOWER EXTREMITY (N/A) as a surgical intervention.  The patient's history has been reviewed, patient examined, no change in status, stable for surgery.  I have reviewed the patient's chart and labs.  Questions were answered to the patient's satisfaction.     Annamarie Major

## 2022-01-10 NOTE — Progress Notes (Signed)
Received report from [place] resting-NAD-purewick in place-pt aware that transport has been contacted and d/c time is 1800-R groin clean dry intact

## 2022-01-10 NOTE — Progress Notes (Signed)
Pt incontinent of hard stool-cleaned and new diaper applied-pure wick removed-pt from stretcher to own w/c with hoyer lift/3 staff assist-R femoral groin WNL at d/c-pt d/c with facility staff via transport Lucianne Lei

## 2022-01-10 NOTE — Op Note (Signed)
    Patient name: [name] MRN: [id number] DOB: March 12, 1956 Sex: female  01/10/2022 Pre-operative Diagnosis: Left leg ulcer Post-operative diagnosis:  Same Surgeon:  Annamarie Major Procedure Performed:  1.  Ultrasound guided access, right femoral artery  2.  Abdominal aortogram  3.  Bilateral lower extremity runoff  4.  Second-order catheterization  5.  Conscious sedation, 24 minutes    Indications: This is a 66 year old female with pressure wounds on the right and a surgical nonhealing wound on the left from hardware removal.  She is here for vascular valuation  Procedure:  The patient was identified in the holding area and taken to room 8.  The patient was then placed supine on the table and prepped and draped in the usual sterile fashion.  A time out was called.  Conscious sedation was administered with the use of IV fentanyl and Versed under continuous physician and nurse monitoring.  Heart rate, blood pressure, and oxygen saturation were continuously monitored.  Total sedation time was 24 minutes.  Ultrasound was used to evaluate the right common femoral artery.  It was patent .  A digital ultrasound image was acquired.  A micropuncture needle was used to access the right common femoral artery under ultrasound guidance.  An 018 wire was advanced without resistance and a micropuncture sheath was placed.  The 018 wire was removed and a benson wire was placed.  The micropuncture sheath was exchanged for a 5 french sheath.  An omniflush catheter was advanced over the wire to the level of L-1.  An abdominal angiogram was obtained.  Next, using the omniflush catheter and a benson wire, the aortic bifurcation was crossed and the catheter was placed into theleft external iliac artery and left runoff was obtained.  right runoff was performed via retrograde sheath injections.  Findings:   Aortogram: No significant renal artery stenosis.  The infrarenal abdominal aorta is widely patent.  Bilateral  common and external neck arteries widely patent.  Right Lower Extremity: The right common femoral, profundofemoral, and superficial femoral artery are widely patent without stenosis.  The popliteal arteries widely patent with three-vessel runoff.  Left Lower Extremity: The left common femoral, profundofemoral, and superficial femoral artery are widely patent without stenosis.  The popliteal artery is widely patent with three-vessel runoff  Intervention: None, the groin was closed with a Celt  Impression:  #1  No significant aortoiliac occlusive disease  #2  No significant femoral-popliteal disease  #3  Three-vessel runoff bilaterally   V. Annamarie Major, M.D., [facility] [place] Office: [phone] Pager:  [phone]

## 2022-01-10 NOTE — Interval H&P Note (Signed)
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  The various methods of treatment have been discussed with the patient and family. After consideration of risks, benefits and other options for treatment, the patient has consented to  Procedure(s): ABDOMINAL AORTOGRAM W/LOWER EXTREMITY (N/A) as a surgical intervention.  The patient's history has been reviewed, patient examined, no change in status, stable for surgery.  I have reviewed the patient's chart and labs.  Questions were answered to the patient's satisfaction.     Annamarie Major

## 2022-01-11 ENCOUNTER — Encounter (HOSPITAL_COMMUNITY): Payer: Self-pay | Admitting: Surgery

## 2022-02-01 ENCOUNTER — Encounter: Payer: Medicaid Other | Admitting: Vascular Surgery

## 2022-03-30 ENCOUNTER — Ambulatory Visit (INDEPENDENT_AMBULATORY_CARE_PROVIDER_SITE_OTHER): Payer: Medicaid Other | Admitting: Podiatry

## 2022-03-30 DIAGNOSIS — L89619 Pressure ulcer of right heel, unspecified stage: Secondary | ICD-10-CM | POA: Diagnosis not present

## 2022-03-30 DIAGNOSIS — L97412 Non-pressure chronic ulcer of right heel and midfoot with fat layer exposed: Secondary | ICD-10-CM | POA: Diagnosis not present

## 2022-03-30 DIAGNOSIS — I739 Peripheral vascular disease, unspecified: Secondary | ICD-10-CM | POA: Diagnosis not present

## 2022-03-30 NOTE — Progress Notes (Signed)
Subjective:  Patient ID: [name], female    DOB: 1956-01-08,  MRN: [id number]  Chief Complaint  Patient presents with   Wound Check    RIGHT HEEL PRESSURE SORE. Patient is not diabetic. Patient is staying at a facility in [place]. Patient dressing was being changed every three days.     66 y.o. female presents with concern for heel pressure injury.  She is wheelchair-bound at this point in time.  She does not have a history of diabetes.  She has been getting the dressing changed to her right foot every 3 days.  She has been told that she is at risk of losing her leg if the wound gets worse.  She denies any nausea or vomiting or fever does report some chills recently.  She also reports having a vascular procedure done to increase the blood flow in her right leg previously not sure who did that procedure for her.  She denies having any recent surgery done on her right foot.  Past Medical History:  Diagnosis Date   Allergy    Bipolar disorder ([place])    Depression    Drug abuse ([place])    history of, went to rehab 2015   GERD (gastroesophageal reflux disease)    Lung cancer ([place])    lung ca dx 11/11- right upper lobe    Allergies  Allergen Reactions   Sulfa Antibiotics Swelling and Rash    Neck swelling    ROS: Negative except as per HPI above  Objective:  General: AAO x3, NAD  Dermatological: With inspection of the plantar posterior aspect of the right heel there is noted to be circular decubitus ulceration to the depth of subcutaneous fat tissue approximately 1 to 1.5 cm in diameter.  There is no active drainage.  Majority of the ulceration has eschar overlying a small area of opening.  There is no drainage no surrounding erythema no malodor no fluctuance to indicate abscess.  Overall no signs of infection.  Overall the skin of the right foot has purplish discoloration related to chronic edema of the right lower extremity.  Vascular:  Dorsalis Pedis artery and Posterior  Tibial artery pedal pulses are 2/4 bilateral.  Capillary fill time < 3 sec to all digits.   Neruologic: Grossly intact via light touch bilateral. Protective threshold intact to all sites bilateral.   Musculoskeletal: No gross boney pedal deformities bilateral. No pain, crepitus, or limitation noted with foot and ankle range of motion bilateral. Muscular strength 5/5 in all groups tested bilateral.  Gait: Unassisted, Nonantalgic.     Radiographs:  Deferred as wound is very superficial at this time Assessment:   1. Ulcer of right heel, with fat layer exposed ([place])   2. Pressure injury of skin of right heel, unspecified injury stage   3. PAD (peripheral artery disease) ([place])      Plan:  Patient was evaluated and treated and all questions answered.  #Decubitus ulceration of the right posterior plantar heel without evidence of infection to the level of subcutaneous fat tissue  -We discussed the etiology and factors that are a part of the wound healing process.  We also discussed the risk of infection both soft tissue and osteomyelitis from open ulceration.  Discussed the risk of limb loss if this happens or worsens. -Debridement as below. -Dressed with Betadine, DSD. -Continue home dressing changes 3 times per week with Betadine and cushioned adhesive bandage or dry gauze -Continue off-loading with restricted weight bearing and pressure prevention  measures including Prevalon boots and floating heels when sleeping -Vascular testing deferred as patient has strong pedal pulses and has previously been revascularized -Last antibiotics: No antibiotics indicated at this time no sign of infection -Imaging: Deferred -I am going to place referral to the [place] wound care center as I believe the patient would benefit from more routine and long-term wound care for this right heel ulceration that does not appear of any infection at this time.  Procedure: Excisional Debridement of  Wound Rationale: Removal of non-viable soft tissue from the wound to promote healing.  Anesthesia: none Post-Debridement Wound Measurements: 1 cm x 1.2 cm x 0.2 cm  Type of Debridement: Sharp Excisional Tissue Removed: Non-viable soft tissue Depth of Debridement: subcutaneous tissue. Technique: Sharp excisional debridement to bleeding, viable wound base.  Dressing: Dry, sterile, compression dressing. Disposition: Patient tolerated procedure well.             Everitt Amber, DPM [facility]

## 2022-04-17 ENCOUNTER — Encounter (HOSPITAL_BASED_OUTPATIENT_CLINIC_OR_DEPARTMENT_OTHER): Payer: Medicaid Other | Attending: Internal Medicine | Admitting: Internal Medicine

## 2022-04-17 DIAGNOSIS — I739 Peripheral vascular disease, unspecified: Secondary | ICD-10-CM

## 2022-04-17 DIAGNOSIS — F314 Bipolar disorder, current episode depressed, severe, without psychotic features: Secondary | ICD-10-CM

## 2022-04-17 DIAGNOSIS — Z993 Dependence on wheelchair: Secondary | ICD-10-CM

## 2022-04-17 DIAGNOSIS — M6281 Muscle weakness (generalized): Secondary | ICD-10-CM | POA: Insufficient documentation

## 2022-04-17 DIAGNOSIS — L89613 Pressure ulcer of right heel, stage 3: Secondary | ICD-10-CM | POA: Diagnosis not present

## 2022-04-17 NOTE — Progress Notes (Signed)
[name], [name] ([id number]) [id number] Page 1 of 8 Visit Report for 04/17/2022 Chief Complaint Document Details Patient Name: Date of Service: [place], [place] NNA L. 04/17/2022 8:00 A M Medical Record Number: [id number] Patient Account Number: [id number] Date of Birth/Sex: Treating RN: 12/14/1955 (66 y.o. F) Primary Care Provider: PA Haig Prophet, NO Other Clinician: Referring Provider: Treating Provider/Extender: Tonita Cong in Treatment: 0 Information Obtained from: Patient Chief Complaint 04/17/2022; right heel wound Electronic Signature(s) Signed: 04/17/2022 11:10:27 AM By: Kalman Shan DO Entered By: Kalman Shan on 04/17/2022 08:54:14 -------------------------------------------------------------------------------- HPI Details Patient Name: Date of Service: Claudia Shire, DO NNA L. 04/17/2022 8:00 A M Medical Record Number: [id number] Patient Account Number: [id number] Date of Birth/Sex: Treating RN: [birth year] (66 y.o. F) Primary Care Provider: PA Haig Prophet, NO Other Clinician: Referring Provider: Treating Provider/Extender: Tonita Cong in Treatment: 0 History of Present Illness HPI Description: 04/17/2022 Ms. [name] is a 66 year old female with a past medical history of lung cancer, bipolar disorder, and currently wheelchair-bound due to muscle weakness however etiology is unclear that presents to the clinic for a 1 year history of right heel wound. She states that 1 year ago she developed muscle weakness and has been wheelchair-bound. The heel wound started once she stopped being ambulatory. She has been following with podiatry for this issue. She has been using Betadine to the wound bed 3 times weekly. She currently denies signs of infection. She states she has a Actor that she sporadically uses. She states in the bed however she has an offloading device that does not allow her heels to  touch the mattress. She had an abdominal aortogram on 01/10/2022 that showed no significant aorto iliac occlusive disease, no significant femoral-popliteal disease and there was 3 left vessel runoff bilaterally. Electronic Signature(s) Signed: 04/17/2022 11:10:27 AM By: Kalman Shan DO Entered By: Kalman Shan on 04/17/2022 09:00:31 -------------------------------------------------------------------------------- Physical Exam Details Patient Name: Date of Service: Claudia Shire, DO NNA L. 04/17/2022 8:00 A M Medical Record Number: [id number] Patient Account Number: [id number] Date of Birth/Sex: Treating RN: May 13, 1956 (66 y.o. F) Primary Care Provider: PA Darnelle Spangle Other ClinicianSHALIKA, ARNTZ ([id number]) [id number] Page 2 of 8 Referring Provider: Treating Provider/Extender: Tonita Cong in Treatment: 0 Constitutional respirations regular, non-labored and within target range for patient.. Cardiovascular 2+ dorsalis pedis/posterior tibialis pulses. Psychiatric pleasant and cooperative. Notes Right heel: Open wound with nonviable surface. No signs of infection including increased warmth, erythema or purulent drainage. Currently no bone exposed. Electronic Signature(s) Signed: 04/17/2022 11:10:27 AM By: Kalman Shan DO Entered By: Kalman Shan on 04/17/2022 09:00:37 -------------------------------------------------------------------------------- Physician Orders Details Patient Name: Date of Service: Claudia Shire, DO NNA L. 04/17/2022 8:00 A M Medical Record Number: [id number] Patient Account Number: [id number] Date of Birth/Sex: Treating RN: [birth year] (66 y.o. Tonita Phoenix, Lauren Primary Care Provider: PA TIENT, NO Other Clinician: Referring Provider: Treating Provider/Extender: Tonita Cong in Treatment: 0 Verbal / Phone Orders: No Diagnosis Coding ICD-10 Coding Code  Description [phone] Pressure ulcer of right heel, stage 3 I73.9 Peripheral vascular disease, unspecified F31.4 Bipolar disorder, current episode depressed, severe, without psychotic features C34.90 Malignant neoplasm of unspecified part of unspecified bronchus or lung Z99.3 Dependence on wheelchair Follow-up Appointments ppointment in 2 weeks. - w/ Dr. Heber Callaghan Return A Bathing/ Shower/ Hygiene May shower with protection but do not get wound dressing(s) wet. Edema Control - Lymphedema / SCD / Other Elevate legs to the level of the heart or above  for 30 minutes daily and/or when sitting, a frequency of: Avoid standing for long periods of time. Off-Loading Other: - keep pressure off of right heel Prevalon Boot - while in bed Wound Treatment Wound #1 - Calcaneus Wound Laterality: Right Cleanser: Soap and Water Discharge Instructions: May shower and wash wound with dial antibacterial soap and water prior to dressing change. Cleanser: Wound Cleanser Discharge Instructions: Cleanse the wound with wound cleanser prior to applying a clean dressing using gauze sponges, not tissue or cotton balls. Prim Dressing: Hydrofera Blue Ready Transfer Foam, 2.5x2.5 (in/in) ary Discharge Instructions: Apply directly to wound bed as directed Prim Dressing: MediHoney Gel, tube 1.5 (oz) Magalie Almon, Kassy L ([id number]) [id number] Page 3 of 8 Discharge Instructions: Apply to wound bed as instructed Secondary Dressing: ABD Pad, 5x9 Discharge Instructions: Apply over primary dressing as directed. Secured With: [employer], 4.5x3.1 (in/yd) Discharge Instructions: Secure with Kerlix as directed. Secured With: 54M Medipore H Soft Cloth Surgical T ape, 4 x 10 (in/yd) Discharge Instructions: Secure with tape as directed. Secured With: [employer] Size 5, 10 (yds) Secured With: Engineer, maintenance) Signed: 04/17/2022 11:10:27 AM By: Kalman Shan DO Entered By: Kalman Shan on 04/17/2022 09:07:53 -------------------------------------------------------------------------------- Problem List Details Patient Name: Date of Service: Claudia Shire, DO NNA L. 04/17/2022 8:00 A M Medical Record Number: [id number] Patient Account Number: [id number] Date of Birth/Sex: Treating RN: December 18, 1955 (66 y.o. F) Primary Care Provider: PA Haig Prophet, NO Other Clinician: Referring Provider: Treating Provider/Extender: Tonita Cong in Treatment: 0 Active Problems ICD-10 Encounter Code Description Active Date MDM Diagnosis L89.613 Pressure ulcer of right heel, stage 3 04/17/2022 No Yes I73.9 Peripheral vascular disease, unspecified 04/17/2022 No Yes F31.4 Bipolar disorder, current episode depressed, severe, without psychotic 04/17/2022 No Yes features C34.90 Malignant neoplasm of unspecified part of unspecified bronchus or lung 04/17/2022 No Yes Z99.3 Dependence on wheelchair 04/17/2022 No Yes Inactive Problems Resolved Problems Electronic Signature(s) Signed: 04/17/2022 11:10:27 AM By: Kalman Shan DO Entered By: Kalman Shan on 04/17/2022 08:53:04 Dulay, Eleanore L ([id number]) [id number] Page 4 of 8 -------------------------------------------------------------------------------- Progress Note Details Patient Name: Date of Service: [place], [place] NNA L. 04/17/2022 8:00 A M Medical Record Number: [id number] Patient Account Number: [id number] Date of Birth/Sex: Treating RN: Oct 04, 1955 (66 y.o. F) Primary Care Provider: PA Haig Prophet, NO Other Clinician: Referring Provider: Treating Provider/Extender: Tonita Cong in Treatment: 0 Subjective Chief Complaint Information obtained from Patient 04/17/2022; right heel wound History of Present Illness (HPI) 04/17/2022 Ms. Bob Eastwood is a 66 year old female with a past medical history of lung cancer, bipolar disorder, and currently  wheelchair-bound due to muscle weakness however etiology is unclear that presents to the clinic for a 1 year history of right heel wound. She states that 1 year ago she developed muscle weakness and has been wheelchair-bound. The heel wound started once she stopped being ambulatory. She has been following with podiatry for this issue. She has been using Betadine to the wound bed 3 times weekly. She currently denies signs of infection. She states she has a Actor that she sporadically uses. She states in the bed however she has an offloading device that does not allow her heels to touch the mattress. She had an abdominal aortogram on 01/10/2022 that showed no significant aorto iliac occlusive disease, no significant femoral-popliteal disease and there was 3 left vessel runoff bilaterally. Patient History Information obtained from Patient, Chart. Allergies Sulfa (Sulfonamide Antibiotics) Family History Unknown History. Social History Former smoker, Marital Status -  Single, Alcohol Use - Never, Drug Use - Prior History, Caffeine Use - Rarely. Medical History Hospitalization/Surgery History - abdominal aortogram w/ ble intervention. - left hardware removal. - tonsillectomy. - lung lobectomy. - tubal ligatrion. - ankle fx surgery. Medical A Surgical History Notes nd Gastrointestinal GERD Oncologic hx lung ca Psychiatric drug abuse, bipolar disorder, depression Review of Systems (ROS) Constitutional Symptoms (General Health) Denies complaints or symptoms of Fatigue, Fever, Chills, Marked Weight Change. Eyes Denies complaints or symptoms of Dry Eyes, Vision Changes, Glasses / Contacts. Ear/Nose/Mouth/Throat Denies complaints or symptoms of Chronic sinus problems or rhinitis. Respiratory Denies complaints or symptoms of Chronic or frequent coughs, Shortness of Breath. Cardiovascular Denies complaints or symptoms of Chest pain. Endocrine Denies complaints or symptoms of Heat/cold  intolerance. Genitourinary Denies complaints or symptoms of Frequent urination. Integumentary (Skin) Complains or has symptoms of Wounds. Musculoskeletal Denies complaints or symptoms of Muscle Pain, Muscle Weakness. Neurologic Denies complaints or symptoms of Numbness/parasthesias. KINJAL, NEITZKE ([id number]) [id number] Page 5 of 8 Objective Constitutional respirations regular, non-labored and within target range for patient.. Vitals Time Taken: 8:16 AM, Height: 68 in, Source: Stated, Weight: 145 lbs, Source: Stated, BMI: 22, Temperature: 98.7 F, Pulse: 86 bpm, Respiratory Rate: 20 breaths/min, Blood Pressure: 112/76 mmHg. Cardiovascular 2+ dorsalis pedis/posterior tibialis pulses. Psychiatric pleasant and cooperative. General Notes: Right heel: Open wound with nonviable surface. No signs of infection including increased warmth, erythema or purulent drainage. Currently no bone exposed. Integumentary (Hair, Skin) Wound #1 status is Open. Original cause of wound was Pressure Injury. The date acquired was: 04/14/2022. The wound is located on the Right Calcaneus. The wound measures 0.6cm length x 0.9cm width x 0.3cm depth; 0.424cm^2 area and 0.127cm^3 volume. There is Fat Layer (Subcutaneous Tissue) exposed. There is no tunneling or undermining noted. There is a medium amount of serosanguineous drainage noted. The wound margin is distinct with the outline attached to the wound base. There is medium (34-66%) red, pink granulation within the wound bed. There is a medium (34-66%) amount of necrotic tissue within the wound bed including Adherent Slough. The periwound skin appearance exhibited: Callus, Dry/Scaly. The periwound skin appearance did not exhibit: Crepitus, Excoriation, Induration, Rash, Scarring, Maceration, Atrophie Blanche, Cyanosis, Ecchymosis, Hemosiderin Staining, Mottled, Pallor, Rubor, Erythema. Periwound temperature was noted as No Abnormality.  The periwound has tenderness on palpation. Assessment Active Problems ICD-10 Pressure ulcer of right heel, stage 3 Peripheral vascular disease, unspecified Bipolar disorder, current episode depressed, severe, without psychotic features Malignant neoplasm of unspecified part of unspecified bronchus or lung Dependence on wheelchair Patient presents with a 1 year history of of stage III pressure ulcer to the right heel since becoming wheelchair/bedbound from unknown etiology. Her ABI and TBI on the right is within normal range. She has palpable pedal pulses. She should have adequate blood flow for healing.At this time I recommended Hydrofera Blue and Medihoney daily. Patient has a Actor and I recommended she use this when she is in the wheelchair. She has an offloading device in the bed and I recommended continuing this. No x-ray on file. May consider this in the near future. I offered her an appointment in 1 week. She would like to follow-up biweekly. Plan Follow-up Appointments: Return Appointment in 2 weeks. - w/ Dr. Heber Twin Bathing/ Shower/ Hygiene: May shower with protection but do not get wound dressing(s) wet. Edema Control - Lymphedema / SCD / Other: Elevate legs to the level of the heart or above for 30 minutes daily and/or when sitting, a  frequency of: Avoid standing for long periods of time. Off-Loading: Other: - keep pressure off of right heel Prevalon Boot - while in bed WOUND #1: - Calcaneus Wound Laterality: Right Cleanser: Soap and Water Discharge Instructions: May shower and wash wound with dial antibacterial soap and water prior to dressing change. Cleanser: Wound Cleanser Discharge Instructions: Cleanse the wound with wound cleanser prior to applying a clean dressing using gauze sponges, not tissue or cotton balls. Prim Dressing: Hydrofera Blue Ready Transfer Foam, 2.5x2.5 (in/in) ary Discharge Instructions: Apply directly to wound bed as directed Prim  Dressing: MediHoney Gel, tube 1.5 (oz) ary Discharge Instructions: Apply to wound bed as instructed Secondary Dressing: ABD Pad, 5x9 Discharge Instructions: Apply over primary dressing as directed. Secured With: [employer], 4.5x3.1 (in/yd) Discharge Instructions: Secure with Kerlix as directed. Secured With: 17M Medipore H Soft Cloth Surgical T ape, 4 x 10 (in/yd) Discharge Instructions: Secure with tape as directed. Secured With: Stretch Net Size 5, 10 (yds) Secured With: 1. [address] and [place], [place] L ([id number]) [id number] Page 6 of 8 2. Continue aggressive offloading 3. Follow-up in 2 weeks Electronic Signature(s) Signed: 04/17/2022 11:10:27 AM By: Kalman Shan DO Entered By: Kalman Shan on 04/17/2022 09:09:49 -------------------------------------------------------------------------------- HxROS Details Patient Name: Date of Service: Claudia Shire, DO NNA L. 04/17/2022 8:00 A M Medical Record Number: [id number] Patient Account Number: [id number] Date of Birth/Sex: Treating RN: [birth year] (66 y.o. Tonita Phoenix, Lauren Primary Care Provider: PA Haig Prophet, [place] Other Clinician: Referring Provider: Treating Provider/Extender: Tonita Cong in Treatment: 0 Information Obtained From Patient Chart Constitutional Symptoms (General Health) Complaints and Symptoms: Negative for: Fatigue; Fever; Chills; Marked Weight Change Eyes Complaints and Symptoms: Negative for: Dry Eyes; Vision Changes; Glasses / Contacts Ear/Nose/Mouth/Throat Complaints and Symptoms: Negative for: Chronic sinus problems or rhinitis Respiratory Complaints and Symptoms: Negative for: Chronic or frequent coughs; Shortness of Breath Cardiovascular Complaints and Symptoms: Negative for: Chest pain Endocrine Complaints and Symptoms: Negative for: Heat/cold intolerance Genitourinary Complaints and Symptoms: Negative for:  Frequent urination Integumentary (Skin) Complaints and Symptoms: Positive for: Wounds Musculoskeletal Complaints and Symptoms: Negative for: Muscle Pain; Muscle Weakness Neurologic Complaints and Symptoms: Negative for: Numbness/parasthesias Marsolek, Oaklie L ([id number]) [id number] Page 7 of 8 Hematologic/Lymphatic Gastrointestinal Medical History: Past Medical History Notes: GERD Immunological Oncologic Medical History: Past Medical History Notes: hx lung ca Psychiatric Medical History: Past Medical History Notes: drug abuse, bipolar disorder, depression Immunizations Pneumococcal Vaccine: Received Pneumococcal Vaccination: No Implantable Devices None Hospitalization / Surgery History Type of Hospitalization/Surgery abdominal aortogram w/ ble intervention left hardware removal tonsillectomy lung lobectomy tubal ligatrion ankle fx surgery Family and Social History Unknown History: Yes; Former smoker; Marital Status - Single; Alcohol Use: Never; Drug Use: Prior History; Caffeine Use: Rarely; Financial Concerns: No; Food, Clothing or Shelter Needs: No; Support System Lacking: No; Transportation Concerns: No Electronic Signature(s) Signed: 04/17/2022 11:10:27 AM By: Kalman Shan DO Signed: 04/17/2022 3:46:46 PM By: Rhae Hammock RN Entered By: Rhae Hammock on 04/14/2022 11:39:31 -------------------------------------------------------------------------------- SuperBill Details Patient Name: Date of Service: Claudia Shire, DO NNA L. 04/17/2022 Medical Record Number: [id number] Patient Account Number: [id number] Date of Birth/Sex: Treating RN: [birth year] (66 y.o. Tonita Phoenix, Lauren Primary Care Provider: PA Haig Prophet, [place] Other Clinician: Referring Provider: Treating Provider/Extender: Tonita Cong in Treatment: 0 Diagnosis Coding ICD-10 Codes Code Description [phone] Pressure ulcer of right heel, stage  3 I73.9 Peripheral vascular disease, unspecified F31.4 Bipolar disorder, current episode depressed, severe, without psychotic features KAYE, LUOMA L ([id number]) [id number]  Page 8 of 8 C34.90 Malignant neoplasm of unspecified part of unspecified bronchus or lung Z99.3 Dependence on wheelchair Facility Procedures : CPT4 Code: [id number] Description: [facility] VISIT-LEV 4 EST PT Modifier: Quantity: 1 Physician Procedures : CPT4 Code Description Modifier [id number] - WC PHYS LEVEL 4 - NEW PT ICD-10 Diagnosis Description L89.613 Pressure ulcer of right heel, stage 3 I73.9 Peripheral vascular disease, unspecified Z99.3 Dependence on wheelchair F31.4 Bipolar disorder,  current episode depressed, severe, without psychotic features Quantity: 1 Electronic Signature(s) Signed: 04/17/2022 11:10:27 AM By: Kalman Shan DO Entered By: Kalman Shan on 04/17/2022 09:10:08

## 2022-04-17 NOTE — Progress Notes (Signed)
JI, FAIRBURN L ([id number]) [id number] Page 1 of 4 Visit Report for 04/17/2022 Abuse Risk Screen Details Patient Name: Date of Service: [name], [name] NNA L. 04/17/2022 8:00 A M Medical Record Number: [id number] Patient Account Number: [id number] Date of Birth/Sex: Treating RN: [birth year] (66 y.o. Debby Bud Primary Care Rockney Grenz: PA Haig Prophet, NO Other Clinician: Referring Andrius Andrepont: Treating Audra Kagel/Extender: Tonita Cong in Treatment: 0 Abuse Risk Screen Items Answer ABUSE RISK SCREEN: Has anyone close to you tried to hurt or harm you recentlyo No [name] you feel uncomfortable with anyone in your familyo No Has anyone forced you [name] things that you didnt want to doo No Electronic Signature(s) Signed: 04/17/2022 4:59:17 PM By: Deon Pilling RN, BSN Entered By: Deon Pilling on 04/17/2022 08:18:53 -------------------------------------------------------------------------------- Activities of Daily Living Details Patient Name: Date of Service: [name], [name] NNA L. 04/17/2022 8:00 A M Medical Record Number: [id number] Patient Account Number: [id number] Date of Birth/Sex: Treating RN: 22-Apr-1956 (66 y.o. Debby Bud Primary Care Hiliana Eilts: PA Haig Prophet, [place] Other Clinician: Referring [name]: Treating Kearney Evitt/Extender: Tonita Cong in Treatment: 0 Activities of Daily Living Items Answer Activities of Daily Living (Please select one for each item) Drive Automobile Not Able T Medications ake Completely Able Use T elephone Completely Able Care for Appearance Need Assistance Use T oilet Need Assistance Bath / Shower Need Assistance Dress Self Need Assistance Feed Self Completely Able Walk Not Able Get In / Out Bed Need Assistance Housework Not Able Prepare Meals Not [place] Not Able Shop for Self Not Able Electronic Signature(s) Signed: 04/17/2022 4:59:17 PM By: Deon Pilling RN,  BSN Entered By: Deon Pilling on 04/17/2022 08:21:57 Klatt, Marletta L ([id number]) [id number] Page 2 of 4 -------------------------------------------------------------------------------- Education Screening Details Patient Name: Date of Service: [name], [name] NNA L. 04/17/2022 8:00 A M Medical Record Number: [id number] Patient Account Number: [id number] Date of Birth/Sex: Treating RN: 01/29/1956 (66 y.o. Debby Bud Primary Care Joni Colegrove: PA Haig Prophet, [place] Other Clinician: Referring [name]: Treating Eryn Marandola/Extender: Tonita Cong in Treatment: 0 Primary Learner Assessed: Patient Learning Preferences/Education Level/Primary Language Learning Preference: Explanation, Demonstration, Printed Material Highest Education Level: High School Preferred Language: English Cognitive Barrier Language Barrier: No Translator Needed: No Memory Deficit: No Emotional Barrier: No Cultural/Religious Beliefs Affecting Medical Care: No Physical Barrier Impaired Vision: No Impaired Hearing: No Decreased Hand dexterity: No Knowledge/Comprehension Knowledge Level: High Comprehension Level: High Ability to understand written instructions: High Ability to understand verbal instructions: High Motivation Anxiety Level: Calm Cooperation: Cooperative Education Importance: Acknowledges Need Interest in Health Problems: Asks Questions Perception: Coherent Willingness to Engage in Self-Management High Activities: Readiness to Engage in Self-Management High Activities: Electronic Signature(s) Signed: 04/17/2022 4:59:17 PM By: Deon Pilling RN, BSN Entered By: Deon Pilling on 04/17/2022 08:22:15 -------------------------------------------------------------------------------- Fall Risk Assessment Details Patient Name: Date of Service: Claudia Shire, [name] NNA L. 04/17/2022 8:00 A M Medical Record Number: [id number] Patient Account Number:  [id number] Date of Birth/Sex: Treating RN: May 21, 1955 (66 y.o. Debby Bud Primary Care Kolleen Ochsner: PA Haig Prophet, [place] Other Clinician: Referring [name]: Treating [name]/Extender: Tonita Cong in Treatment: 0 Fall Risk Assessment Items Have you had 2 or more falls in the last 12 monthso 0 No Gonzalez, Claudia L ([id number]) [id number] Page 3 of 4 Have you had any fall that resulted in injury in the last 12 monthso 0 No FALLS RISK SCREEN History of falling - immediate or within 3 months 0 No Secondary diagnosis ([name]  you have 2 or more medical diagnoseso) 0 No Ambulatory aid None/bed rest/wheelchair/nurse 0 Yes Crutches/cane/Clemence 0 No Furniture 0 No Intravenous therapy Access/Saline/Heparin Lock 0 No Gait/Transferring Normal/ bed rest/ wheelchair 0 Yes Weak (short steps with or without shuffle, stooped but able to lift head while walking, may seek 0 No support from furniture) Impaired (short steps with shuffle, may have difficulty arising from chair, head down, impaired 0 No balance) Mental Status Oriented to own ability 0 Yes Electronic Signature(s) Signed: 04/17/2022 4:59:17 PM By: Deon Pilling RN, BSN Entered By: Deon Pilling on 04/17/2022 08:22:24 -------------------------------------------------------------------------------- Foot Assessment Details Patient Name: Date of Service: Claudia Shire, [name] NNA L. 04/17/2022 8:00 A M Medical Record Number: [id number] Patient Account Number: [id number] Date of Birth/Sex: Treating RN: February 09, 1956 (66 y.o. Debby Bud Primary Care Kaevion Sinclair: PA TIENT, NO Other Clinician: Referring Melony Tenpas: Treating [name]/Extender: Tonita Cong in Treatment: 0 Foot Assessment Items Site Locations + = Sensation present, - = Sensation absent, C = Callus, U = Ulcer R = Redness, W = Warmth, M = Maceration, PU = Pre-ulcerative lesion F = Fissure, S = Swelling,  D = Dryness Assessment Right: Left: Other Deformity: No No Prior Foot Ulcer: No No Prior Amputation: No No Charcot Joint: No No Ambulatory Status: Non-ambulatory Assistance Device: Wheelchair Dodson, Meridian ([id number]) [id number] Page 4 of 4 Gait: Electronic Signature(s) Signed: 04/17/2022 4:59:17 PM By: Deon Pilling RN, BSN Entered By: Deon Pilling on 04/17/2022 08:28:46 -------------------------------------------------------------------------------- Nutrition Risk Screening Details Patient Name: Date of Service: Claudia Shire, [name] NNA L. 04/17/2022 8:00 A M Medical Record Number: [id number] Patient Account Number: [id number] Date of Birth/Sex: Treating RN: 05-20-1955 (66 y.o. Helene Shoe, Meta.Reding Primary Care Delores Edelstein: PA TIENT, NO Other Clinician: Referring Mccade Sullenberger: Treating Feige Lowdermilk/Extender: Tonita Cong in Treatment: 0 Height (in): 68 Weight (lbs): 145 Body Mass Index (BMI): 22 Nutrition Risk Screening Items Score Screening NUTRITION RISK SCREEN: I have an illness or condition that made me change the kind and/or amount of food I eat 2 Yes I eat fewer than two meals per day 0 No I eat few fruits and vegetables, or milk products 0 No I have three or more drinks of beer, liquor or wine almost every day 0 No I have tooth or mouth problems that make it hard for me to eat 0 No I don't always have enough money to buy the food I need 0 No I eat alone most of the time 0 No I take three or more different prescribed or over-the-counter drugs a day 1 Yes Without wanting to, I have lost or gained 10 pounds in the last six months 0 No I am not always physically able to shop, cook and/or feed myself 2 Yes Nutrition Protocols Good Risk Protocol Moderate Risk Protocol 0 Provide education on nutrition High Risk Proctocol Risk Level: Moderate Risk Score: 5 Electronic Signature(s) Signed: 04/17/2022 4:59:17 PM By: Deon Pilling  RN, BSN Entered By: Deon Pilling on 04/17/2022 08:22:36

## 2022-04-17 NOTE — Progress Notes (Addendum)
[name], [name] ([id number]) [id number] Page 1 of 8 Visit Report for 04/17/2022 Allergy List Details Patient Name: Date of Service: [name], [name] [name]. 04/17/2022 8:00 A M Medical Record Number: [id number] Patient Account Number: [id number] Date of Birth/Sex: Treating RN: [birth year] (66 y.o. [name], [name] Primary Care Deshante Cassell: PA Haig Prophet, [place] Other Clinician: Referring Dominick Zertuche: Treating Suleiman Finigan/Extender: [name] in Treatment: 0 Allergies Active Allergies Sulfa (Sulfonamide Antibiotics) Allergy Notes Electronic Signature(s) Signed: 04/17/2022 3:46:46 PM By: Claudia Hammock RN Entered By: [name] on 04/14/2022 11:37:20 -------------------------------------------------------------------------------- Arrival Information Details Patient Name: Date of Service: Claudia Shire, [name] [name]. 04/17/2022 8:00 A M Medical Record Number: [id number] Patient Account Number: [id number] Date of Birth/Sex: Treating RN: Jan 31, 1956 (66 y.o. [name] Primary Care Viriginia Amendola: PA Haig Prophet, [place] Other Clinician: Referring Adaisha Campise: Treating Colburn Asper/Extender: [name] in Treatment: 0 Visit Information Patient Arrived: Wheel Chair Arrival Time: 08:15 Accompanied By: caregiver Transfer Assistance: Manual Patient Identification Verified: Yes Secondary Verification Process Completed: Yes Patient Requires Transmission-Based Precautions: No Patient Has Alerts: Yes Patient Alerts: ABI: R 1.21 01/02/2022 TBI: R 0.98 01/02/2022 Electronic Signature(s) Signed: 04/17/2022 4:59:17 PM By: Claudia Pilling RN, BSN Entered By: [name] on 04/17/2022 08:33:38 Clinic Level of Care Assessment Details -------------------------------------------------------------------------------- [name] ([id number]) [id number] Page 2 of 8 Patient Name: Date of Service: [place], [name] [name].  04/17/2022 8:00 A M Medical Record Number: [id number] Patient Account Number: [id number] Date of Birth/Sex: Treating RN: March 05, 1956 (66 y.o. [name], [name] Primary Care Annalyn Blecher: PA Haig Prophet, NO Other Clinician: Referring Bernadean Saling: Treating Sahara Fujimoto/Extender: [name] in Treatment: 0 Clinic Level of Care Assessment Items TOOL 2 Quantity Score X- 1 0 Use when only an EandM is performed on the INITIAL visit ASSESSMENTS - Nursing Assessment / Reassessment X- 1 20 General Physical Exam (combine w/ comprehensive assessment (listed just below) when performed on new pt. evals) X- 1 25 Comprehensive Assessment (HX, ROS, Risk Assessments, Wounds Hx, etc.) ASSESSMENTS - Wound and Skin A ssessment / Reassessment X - Simple Wound Assessment / Reassessment - one wound 1 5 []  - 0 Complex Wound Assessment / Reassessment - multiple wounds []  - 0 Dermatologic / Skin Assessment (not related to wound area) ASSESSMENTS - Ostomy and/or Continence Assessment and Care []  - 0 Incontinence Assessment and Management []  - 0 Ostomy Care Assessment and Management (repouching, etc.) PROCESS - Coordination of Care X - Simple Patient / Family Education for ongoing care 1 15 []  - 0 Complex (extensive) Patient / Family Education for ongoing care X- 1 10 Staff obtains Programmer, systems, Records, T Results / Process Orders est []  - 0 Staff telephones HHA, Nursing Homes / Clarify orders / etc []  - 0 Routine Transfer to another Facility (non-emergent condition) []  - 0 Routine Hospital Admission (non-emergent condition) X- 1 15 New Admissions / Biomedical engineer / Ordering NPWT Apligraf, etc. , []  - 0 Emergency Hospital Admission (emergent condition) X- 1 10 Simple Discharge Coordination []  - 0 Complex (extensive) Discharge Coordination PROCESS - Special Needs []  - 0 Pediatric / Minor Patient Management []  - 0 Isolation Patient Management []  - 0 Hearing / Language  / Visual special needs []  - 0 Assessment of Community assistance (transportation, D/C planning, etc.) []  - 0 Additional assistance / Altered mentation []  - 0 Support Surface(s) Assessment (bed, cushion, seat, etc.) INTERVENTIONS - Wound Cleansing / Measurement X- 1 5 Wound Imaging (photographs - any number of wounds) []  - 0 Wound Tracing (  instead of photographs) X- 1 5 Simple Wound Measurement - one wound []  - 0 Complex Wound Measurement - multiple wounds X- 1 5 Simple Wound Cleansing - one wound []  - 0 Complex Wound Cleansing - multiple wounds INTERVENTIONS - Wound Dressings X - Small Wound Dressing one or multiple wounds 1 10 []  - 0 Medium Wound Dressing one or multiple wounds []  - 0 Large Wound Dressing one or multiple wounds [name], [name] ([id number]) [id number] Page 3 of 8 []  - 0 Application of Medications - injection INTERVENTIONS - Miscellaneous []  - 0 External ear exam []  - 0 Specimen Collection (cultures, biopsies, blood, body fluids, etc.) []  - 0 Specimen(s) / Culture(s) sent or taken to Lab for analysis []  - 0 Patient Transfer (multiple staff / Harrel Lemon Lift / Similar devices) []  - 0 Simple Staple / Suture removal (25 or less) []  - 0 Complex Staple / Suture removal (26 or more) []  - 0 Hypo / Hyperglycemic Management (close monitor of Blood Glucose) []  - 0 Ankle / Brachial Index (ABI) - [name] not check if billed separately Has the patient been seen at the hospital within the last three years: Yes Total Score: 125 Level Of Care: New/Established - Level 4 Electronic Signature(s) Signed: 04/17/2022 3:46:46 PM By: Claudia Hammock RN Entered By: [name] on 04/17/2022 09:06:22 -------------------------------------------------------------------------------- Encounter Discharge Information Details Patient Name: Date of Service: Claudia Shire, [name] [name]. 04/17/2022 8:00 A M Medical Record Number: [id number] Patient Account Number:  [id number] Date of Birth/Sex: Treating RN: 1955-11-28 (66 y.o. [name], [name] Primary Care Blondell Laperle: PA Haig Prophet, [place] Other Clinician: Referring Gabriell Casimir: Treating Anyelin Mogle/Extender: [name] in Treatment: 0 Encounter Discharge Information Items Discharge Condition: Stable Ambulatory Status: Wheelchair Discharge Destination: Home Transportation: Private Auto Accompanied By: transporter Schedule Follow-up Appointment: Yes Clinical Summary of Care: Patient Declined Electronic Signature(s) Signed: 04/17/2022 3:46:46 PM By: Claudia Hammock RN Entered By: [name] on 04/17/2022 09:06:56 -------------------------------------------------------------------------------- Lower Extremity Assessment Details Patient Name: Date of Service: Claudia Shire, [name] [name]. 04/17/2022 8:00 A M Medical Record Number: [id number] Patient Account Number: [id number] Date of Birth/Sex: Treating RN: July 16, 1955 (66 y.o. [name] Primary Care Sharnette Kitamura: PA Haig Prophet, [place] Other Clinician: Referring Nesa Distel: Treating Thorin Starner/Extender: [name] in Treatment: 0 Edema Assessment W[LeftTAMINA, [name] ([id number])] [Right: [id number] Page 4 of 8] Assessed: [Left: Yes] [Right: No] Edema: [Left: N] [Right: o] Calf Left: Right: Point of Measurement: 34 cm From Medial Instep 30 cm Ankle Left: Right: Point of Measurement: 11 cm From Medial Instep 20 cm Knee To Floor Left: Right: From Medial Instep 43 cm Vascular Assessment Pulses: Dorsalis Pedis Palpable: [Left:Yes] Electronic Signature(s) Signed: 04/17/2022 4:59:17 PM By: Claudia Pilling RN, BSN Entered By: [name] on 04/17/2022 08:29:42 -------------------------------------------------------------------------------- Multi Wound Chart Details Patient Name: Date of Service: Claudia Shire, [name] [name]. 04/17/2022 8:00 A M Medical Record Number:  [id number] Patient Account Number: [id number] Date of Birth/Sex: Treating RN: Jan 04, 1956 (66 y.o. F) Primary Care Milady Fleener: PA Haig Prophet, [place] Other Clinician: Referring Lanessa Shill: Treating Terriann Difonzo/Extender: [name] in Treatment: 0 Vital Signs Height(in): 68 Pulse(bpm): 86 Weight(lbs): 145 Blood Pressure(mmHg): 112/76 Body Mass Index(BMI): 22 Temperature(F): 98.7 Respiratory Rate(breaths/min): 20 [1:Photos:] [N/A:N/A] Right Calcaneus N/A N/A Wound Location: Pressure Injury N/A N/A Wounding Event: Pressure Ulcer N/A N/A Primary Etiology: 04/14/2022 N/A N/A Date Acquired: 0 N/A N/A Weeks of Treatment: Open N/A N/A Wound Status: No N/A N/A Wound Recurrence: 0.6x0.9x0.3 N/A N/A Measurements Gonzalez x W x  D (cm) 0.424 N/A N/A A (cm) : rea 0.127 N/A N/A Volume (cm) : Category/Stage III N/A N/A Classification: Medium N/A N/A Exudate A mount: Serosanguineous N/A N/A Exudate TypeKerrin Gonzalez, [name] ([id number]) [id number] Page 5 of 8 red, brown N/A N/A Exudate Color: Distinct, outline attached N/A N/A Wound Margin: Medium (34-66%) N/A N/A Granulation Amount: Red, Pink N/A N/A Granulation Quality: Medium (34-66%) N/A N/A Necrotic Amount: Fat Layer (Subcutaneous Tissue): Yes N/A N/A Exposed Structures: Fascia: No Tendon: No Muscle: No Joint: No Bone: No None N/A N/A Epithelialization: Callus: Yes N/A N/A Periwound Skin Texture: Excoriation: No Induration: No Crepitus: No Rash: No Scarring: No Dry/Scaly: Yes N/A N/A Periwound Skin Moisture: Maceration: No Atrophie Blanche: No N/A N/A Periwound Skin Color: Cyanosis: No Ecchymosis: No Erythema: No Hemosiderin Staining: No Mottled: No Pallor: No Rubor: No No Abnormality N/A N/A Temperature: Yes N/A N/A Tenderness on Palpation: Treatment Notes Electronic Signature(s) Signed: 04/17/2022 11:10:27 AM By: Geralyn Corwin [name] Entered By: Geralyn Corwin on 04/17/2022 08:53:09 -------------------------------------------------------------------------------- Multi-Disciplinary Care Plan Details Patient Name: Date of Service: Claudia Herald, [name] [name]. 04/17/2022 8:00 A M Medical Record Number: [id number] Patient Account Number: [id number] Date of Birth/Sex: Treating RN: 04-29-1956 (66 y.o. [name] Primary Care Shakina Choy: PA Zenovia Jordan, [place] Other Clinician: Referring Alon Mazor: Treating Arlayne Liggins/Extender: Rogelia Rohrer in Treatment: 0 Active Inactive Electronic Signature(s) Signed: 06/05/2022 5:09:28 PM By: Shawn Stall RN, BSN Signed: 06/07/2022 4:10:20 PM By: Fonnie Mu RN Previous Signature: 04/17/2022 3:46:46 PM Version By: Fonnie Mu RN Entered By: Shawn Stall on 06/05/2022 17:09:28 -------------------------------------------------------------------------------- Pain Assessment Details Patient Name: Date of Service: Claudia Herald, [name] [name]. 04/17/2022 8:00 A M Medical Record Number: [id number] Patient Account Number: [id number] Date of Birth/Sex: Treating RN: [birth year] (66 y.o. Arta Silence Primary Care Coulson Wehner: PA Fidela Juneau Other Clinician: ARLEAN, [name] ([id number]) [id number] Page 6 of 8 Referring Annalynn Centanni: Treating Hollis Tuller/Extender: Rogelia Rohrer in Treatment: 0 Active Problems Location of Pain Severity and Description of Pain Patient Has Paino Yes Site Locations Pain Location: Generalized Pain, Pain in Ulcers Rate the pain. Current Pain Level: 9 Pain Management and Medication Current Pain Management: Medication: No Cold Application: No Rest: No Massage: No Activity: No T.E.N.S.: No Heat Application: No Leg drop or elevation: No Is the Current Pain Management Adequate: Adequate How does your wound impact your activities of daily livingo Sleep: No Bathing: No Appetite: No Relationship With Others:  No Bladder Continence: No Emotions: No Bowel Continence: No Work: No Toileting: No Drive: No Dressing: No Hobbies: No Psychologist, prison and probation services) Signed: 04/17/2022 4:59:17 PM By: Shawn Stall RN, BSN Entered By: Shawn Stall on 04/17/2022 08:22:52 -------------------------------------------------------------------------------- Patient/Caregiver Education Details Patient Name: Date of Service: Claudia Herald, [name] [name]. 12/4/2023andnbsp8:00 A M Medical Record Number: [id number] Patient Account Number: [id number] Date of Birth/Gender: Treating RN: Feb 26, 1956 (66 y.o. [name] Primary Care Physician: PA Zenovia Jordan, [place] Other Clinician: Referring Physician: Treating Physician/Extender: Rogelia Rohrer in Treatment: 0 Education Assessment Education Provided To: Patient Education Topics Provided Welcome T [facility]: o Methods: Explain/Verbal Responses: Reinforcements needed, State content correctly Learned, [name] ([id number]) [id number] Page 7 of 8 Electronic Signature(s) Signed: 04/17/2022 3:46:46 PM By: Fonnie Mu RN Entered By: Fonnie Mu on 04/17/2022 09:05:11 -------------------------------------------------------------------------------- Wound Assessment Details Patient Name: Date of Service: Claudia Herald, [name] [name]. 04/17/2022 8:00 A M Medical Record Number: [id number] Patient Account Number: [id number] Date of Birth/Sex: Treating RN: 12/24/1955 (66 y.o. F) Gonzalez,  Claudia Primary Care Maverick Patman: PA TIENT, NO Other Clinician: Referring Elody Kleinsasser: Treating Greidys Deland/Extender: Rogelia Rohrer in Treatment: 0 Wound Status Wound Number: 1 Primary Etiology: Pressure Ulcer Wound Location: Right Calcaneus Wound Status: Open Wounding Event: Pressure Injury Date Acquired: 04/14/2022 Weeks Of Treatment: 0 Clustered Wound: No Photos Wound Measurements Length: (cm) 0.6 Width: (cm)  0.9 Depth: (cm) 0.3 Area: (cm) 0.424 Volume: (cm) 0.127 % Reduction in Area: % Reduction in Volume: Epithelialization: None Tunneling: No Undermining: No Wound Description Classification: Category/Stage III Wound Margin: Distinct, outline attached Exudate Amount: Medium Exudate Type: Serosanguineous Exudate Color: red, brown Foul Odor After Cleansing: No Slough/Fibrino Yes Wound Bed Granulation Amount: Medium (34-66%) Exposed Structure Granulation Quality: Red, Pink Fascia Exposed: No Necrotic Amount: Medium (34-66%) Fat Layer (Subcutaneous Tissue) Exposed: Yes Necrotic Quality: Adherent Slough Tendon Exposed: No Muscle Exposed: No Joint Exposed: No Bone Exposed: No Periwound Skin Texture Texture Color No Abnormalities Noted: No No Abnormalities Noted: No Callus: Yes Atrophie Blanche: No Crepitus: No Cyanosis: No Excoriation: No Ecchymosis: No [name], [name] ([id number]) [id number] Page 8 of 8 Induration: No Erythema: No Rash: No Hemosiderin Staining: No Scarring: No Mottled: No Pallor: No Moisture Rubor: No No Abnormalities Noted: No Dry / Scaly: Yes Temperature / Pain Maceration: No Temperature: No Abnormality Tenderness on Palpation: Yes Electronic Signature(s) Signed: 04/17/2022 4:59:17 PM By: Shawn Stall RN, BSN Entered By: Shawn Stall on 04/17/2022 08:34:50 -------------------------------------------------------------------------------- Vitals Details Patient Name: Date of Service: Claudia Herald, [name] [name]. 04/17/2022 8:00 A M Medical Record Number: [id number] Patient Account Number: [id number] Date of Birth/Sex: Treating RN: 1956-04-23 (66 y.o. Debara Pickett, Millard.Loa Primary Care Susa Bones: PA TIENT, NO Other Clinician: Referring Ludie Pavlik: Treating Kaetlin Bullen/Extender: Rogelia Rohrer in Treatment: 0 Vital Signs Time Taken: 08:16 Temperature (F): 98.7 Height (in): 68 Pulse (bpm): 86 Source:  Stated Respiratory Rate (breaths/min): 20 Weight (lbs): 145 Blood Pressure (mmHg): 112/76 Source: Stated Reference Range: 80 - 120 mg / dl Body Mass Index (BMI): 22 Electronic Signature(s) Signed: 04/17/2022 4:59:17 PM By: Shawn Stall RN, BSN Entered By: Shawn Stall on 04/17/2022 08:18:44

## 2022-05-01 ENCOUNTER — Encounter (HOSPITAL_BASED_OUTPATIENT_CLINIC_OR_DEPARTMENT_OTHER): Payer: Medicaid Other | Admitting: Internal Medicine

## 2022-05-16 ENCOUNTER — Encounter (HOSPITAL_BASED_OUTPATIENT_CLINIC_OR_DEPARTMENT_OTHER): Payer: Medicaid Other | Attending: Internal Medicine | Admitting: Internal Medicine

## 2022-07-13 ENCOUNTER — Encounter: Payer: Self-pay | Admitting: Radiology

## 2022-08-27 ENCOUNTER — Other Ambulatory Visit: Payer: Self-pay

## 2022-08-27 ENCOUNTER — Emergency Department (HOSPITAL_COMMUNITY)
Admission: EM | Admit: 2022-08-27 | Discharge: 2022-08-27 | Disposition: A | Discharge status: Skilled Nursing Facility | Payer: Medicare Other | Attending: Emergency Medicine | Admitting: Emergency Medicine

## 2022-08-27 DIAGNOSIS — R21 Rash and other nonspecific skin eruption: Secondary | ICD-10-CM | POA: Insufficient documentation

## 2022-08-27 DIAGNOSIS — R600 Localized edema: Secondary | ICD-10-CM | POA: Insufficient documentation

## 2022-08-27 DIAGNOSIS — Z85118 Personal history of other malignant neoplasm of bronchus and lung: Secondary | ICD-10-CM | POA: Diagnosis not present

## 2022-08-27 DIAGNOSIS — T501X5A Adverse effect of loop [high-ceiling] diuretics, initial encounter: Secondary | ICD-10-CM | POA: Insufficient documentation

## 2022-08-27 DIAGNOSIS — Z79899 Other long term (current) drug therapy: Secondary | ICD-10-CM | POA: Diagnosis not present

## 2022-08-27 DIAGNOSIS — Z889 Allergy status to unspecified drugs, medicaments and biological substances status: Secondary | ICD-10-CM

## 2022-08-27 LAB — CBC WITH DIFFERENTIAL/PLATELET
Abs Immature Granulocytes: 0.01 10*3/uL (ref 0.00–0.07)
Basophils Absolute: 0 10*3/uL (ref 0.0–0.1)
Basophils Relative: 0 %
Eosinophils Absolute: 0.1 10*3/uL (ref 0.0–0.5)
Eosinophils Relative: 2 %
HCT: 44.9 % (ref 36.0–46.0)
Hemoglobin: 14.8 g/dL (ref 12.0–15.0)
Immature Granulocytes: 0 %
Lymphocytes Relative: 24 %
Lymphs Abs: 1.7 10*3/uL (ref 0.7–4.0)
MCH: 29.8 pg (ref 26.0–34.0)
MCHC: 33 g/dL (ref 30.0–36.0)
MCV: 90.5 fL (ref 80.0–100.0)
Monocytes Absolute: 0.6 10*3/uL (ref 0.1–1.0)
Monocytes Relative: 8 %
Neutro Abs: 4.6 10*3/uL (ref 1.7–7.7)
Neutrophils Relative %: 66 %
Platelets: 204 10*3/uL (ref 150–400)
RBC: 4.96 MIL/uL (ref 3.87–5.11)
RDW: 12.4 % (ref 11.5–15.5)
WBC: 7.1 10*3/uL (ref 4.0–10.5)
nRBC: 0 % (ref 0.0–0.2)

## 2022-08-27 LAB — URINALYSIS, ROUTINE W REFLEX MICROSCOPIC
Bilirubin Urine: NEGATIVE
Glucose, UA: NEGATIVE mg/dL
Hgb urine dipstick: NEGATIVE
Ketones, ur: NEGATIVE mg/dL
Leukocytes,Ua: NEGATIVE
Nitrite: NEGATIVE
Protein, ur: NEGATIVE mg/dL
Specific Gravity, Urine: 1.014 (ref 1.005–1.030)
pH: 5 (ref 5.0–8.0)

## 2022-08-27 LAB — BASIC METABOLIC PANEL
Anion gap: 9 (ref 5–15)
BUN: 11 mg/dL (ref 8–23)
CO2: 27 mmol/L (ref 22–32)
Calcium: 9.2 mg/dL (ref 8.9–10.3)
Chloride: 100 mmol/L (ref 98–111)
Creatinine, Ser: 0.46 mg/dL (ref 0.44–1.00)
GFR, Estimated: >60 mL/min (ref >60)
Glucose, Bld: 147 mg/dL — ABNORMAL HIGH (ref 70–99)
Potassium: 3.8 mmol/L (ref 3.5–5.1)
Sodium: 136 mmol/L (ref 135–145)

## 2022-08-27 MED ORDER — ENOXAPARIN SODIUM 80 MG/0.8ML IJ SOSY
80.0000 mg | PREFILLED_SYRINGE | Freq: Once | INTRAMUSCULAR | Status: AC
  Administered 2022-08-27: 80 mg via SUBCUTANEOUS
  Filled 2022-08-27: qty 0.8

## 2022-08-27 MED ORDER — HYDROCHLOROTHIAZIDE 25 MG PO TABS: 25.0000 mg | ORAL_TABLET | Freq: Every day | ORAL | 0 refills | Status: DC

## 2022-08-27 MED ORDER — METHYLPREDNISOLONE SODIUM SUCC 125 MG IJ SOLR
125.0000 mg | Freq: Once | INTRAMUSCULAR | Status: AC
  Administered 2022-08-27: 125 mg via INTRAVENOUS
  Filled 2022-08-27: qty 2

## 2022-08-27 MED ORDER — FAMOTIDINE IN NACL 20-0.9 MG/50ML-% IV SOLN
20.0000 mg | Freq: Once | INTRAVENOUS | Status: AC
  Administered 2022-08-27: 20 mg via INTRAVENOUS
  Filled 2022-08-27: qty 50

## 2022-08-27 MED ORDER — MORPHINE SULFATE (PF) 4 MG/ML IV SOLN
4.0000 mg | Freq: Once | INTRAVENOUS | Status: AC
  Administered 2022-08-27: 4 mg via INTRAVENOUS
  Filled 2022-08-27: qty 1

## 2022-08-27 MED ORDER — DIPHENHYDRAMINE HCL 50 MG/ML IJ SOLN
25.0000 mg | Freq: Once | INTRAMUSCULAR | Status: AC
  Administered 2022-08-27: 25 mg via INTRAVENOUS
  Filled 2022-08-27: qty 1

## 2022-08-27 MED ORDER — HYDROCORTISONE 1 % EX CREA
TOPICAL_CREAM | Freq: Once | CUTANEOUS | Status: AC
  Filled 2022-08-27: qty 28

## 2022-08-27 MED ORDER — PREDNISONE 50 MG PO TABS: 50.0000 mg | ORAL_TABLET | Freq: Every day | ORAL | 0 refills | Status: DC

## 2022-08-27 MED ORDER — HYDROXYZINE HCL 25 MG PO TABS: 25.0000 mg | ORAL_TABLET | Freq: Four times a day (QID) | ORAL | 0 refills | Status: DC | PRN

## 2022-08-27 MED ORDER — SELSUN BLUE ITCHY DRY SCALP 1 % EX SHAM: MEDICATED_SHAMPOO | Freq: Every day | CUTANEOUS | 0 refills | Status: DC | PRN

## 2022-08-27 MED ORDER — DIAZEPAM 5 MG/ML IJ SOLN
2.5000 mg | Freq: Once | INTRAMUSCULAR | Status: AC
  Administered 2022-08-27: 2.5 mg via INTRAVENOUS
  Filled 2022-08-27: qty 2

## 2022-08-27 NOTE — ED Triage Notes (Signed)
Pt arrived via [facility] from [place] for a face rash and bilateral lower extremity that started yesterday when she started lasix

## 2022-08-27 NOTE — Discharge Instructions (Addendum)
Stop the lasix.  An ultrasound has been ordered for tomorrow to check for blood clots in you legs.

## 2022-08-27 NOTE — ED Notes (Signed)
Pitting edema noted in bilateral legs--MD made aware

## 2022-08-27 NOTE — ED Provider Notes (Signed)
[place] EMERGENCY DEPARTMENT AT [facility] Provider Note   CSN: [id number] Arrival date & time: 08/27/22  1208     History  Chief Complaint  Patient presents with   Rash    [name] is a 67 y.o. female.  Pt is a 67 yo female with pmhx significant for lung cancer, gerd, bipolar d/o, and ambulatory dysfunction.  Pt is wheelchair bound and has been having trouble with a nonhealing wound to her foot.  She has seen vascular and had an aortogram in August which showed no significant arterial disease.  She has been to podiatry and to wound care.  That wound looks like it is healed.  She has had some swelling in her legs, so she was started on lasix yesterday.  After taking that, she broke out in a rash.          Home Medications Prior to Admission medications   Medication Sig Start Date End Date Taking? Authorizing Provider  hydrochlorothiazide (HYDRODIURIL) 25 MG tablet Take 1 tablet (25 mg total) by mouth daily. 08/27/22  Yes Jacalyn Lefevre, MD  hydrOXYzine (ATARAX) 25 MG tablet Take 1 tablet (25 mg total) by mouth every 6 (six) hours as needed for itching. 08/27/22  Yes Jacalyn Lefevre, MD  predniSONE (DELTASONE) 50 MG tablet Take 1 tablet (50 mg total) by mouth daily with breakfast. 08/27/22  Yes Jacalyn Lefevre, MD  pyrithione zinc (SELSUN BLUE ITCHY DRY SCALP) 1 % shampoo Apply topically daily as needed for itching. 08/27/22  Yes Jacalyn Lefevre, MD  acetaminophen (TYLENOL) 325 MG tablet Take 650 mg by mouth every 4 (four) hours as needed for mild pain.    [provider]  ascorbic acid (VITAMIN C) 500 MG tablet Take 500 mg by mouth 2 (two) times daily.    [provider]  cefdinir (OMNICEF) 300 MG capsule Take 1 capsule (300 mg total) by mouth 2 (two) times daily. 12/29/21   Gilda Crease, MD  folic acid (FOLVITE) 1 MG tablet Take 1 mg by mouth daily.    [provider]  gabapentin (NEURONTIN) 100 MG capsule Take 300 mg by mouth 3  (three) times daily. 07/20/21   [provider]  HYDROcodone-acetaminophen (NORCO) 10-325 MG tablet Take 1 tablet by mouth every 6 (six) hours.    [provider]  lidocaine (LIDODERM) 5 % Place 1 patch onto the skin daily. Remove & Discard patch within 12 hours or as directed by MD; Apply to LOWER LEG    [provider]  lithium 300 MG tablet Take 300-600 mg by mouth See admin instructions.  in the am (0800),  in th evening (1800) 07/20/21   [provider]  Melatonin 10 MG TABS Take 10 mg by mouth at bedtime.    [provider]  midodrine (PROAMATINE) 10 MG tablet Take 1 tablet (10 mg total) by mouth 3 (three) times daily with meals. 08/03/21   Catarina Hartshorn, MD  Multiple Vitamin (MULTIVITAMIN ADULT PO) Take 1 tablet by mouth daily.    [provider]  Nutritional Supplements (NUTRITIONAL SUPPLEMENT PO) Take 120 mLs by mouth in the morning, at noon, and at bedtime. Wound Healing    [provider]  QUEtiapine (SEROQUEL) 100 MG tablet Take 100 mg by mouth at bedtime. 07/20/21   [provider]  QUEtiapine (SEROQUEL) 25 MG tablet Take 25 mg by mouth every morning.    [provider]  saccharomyces boulardii (FLORASTOR) 250 MG capsule Take  250 mg by mouth daily.    [provider]  tamsulosin (FLOMAX) 0.4 MG CAPS capsule Take 0.4 mg by mouth daily.    [provider]      Allergies    Sulfa antibiotics    Review of Systems   Review of Systems  Musculoskeletal:        B leg pain and swelling  Skin:  Positive for rash.  All other systems reviewed and are negative.   Physical Exam Updated Vital Signs BP 116/77   Pulse 87   Temp 98.8 F (37.1 C) (Oral)   Resp 12   SpO2 95%  Physical Exam Vitals and nursing note reviewed.  Constitutional:      Appearance: Normal appearance.  HENT:     Head: Normocephalic and atraumatic.     Comments: Rash to face around nose/lips (new) and to  scalp/right ear (chronic)    Right Ear: External ear normal.     Left Ear: External ear normal.     Nose: Nose normal.     Mouth/Throat:     Mouth: Mucous membranes are moist.     Pharynx: Oropharynx is clear.  Eyes:     Extraocular Movements: Extraocular movements intact.     Conjunctiva/sclera: Conjunctivae normal.     Pupils: Pupils are equal, round, and reactive to light.  Cardiovascular:     Rate and Rhythm: Normal rate and regular rhythm.     Pulses: Normal pulses.     Heart sounds: Normal heart sounds.  Pulmonary:     Effort: Pulmonary effort is normal.     Breath sounds: Normal breath sounds.  Abdominal:     General: Abdomen is flat. Bowel sounds are normal.     Palpations: Abdomen is soft.  Musculoskeletal:     Cervical back: Normal range of motion and neck supple.     Right lower leg: Edema present.     Left lower leg: Edema present.  Skin:    General: Skin is warm.     Capillary Refill: Capillary refill takes less than 2 seconds.     Comments: Wound has healed  Neurological:     General: No focal deficit present.     Mental Status: She is alert and oriented to person, place, and time.  Psychiatric:        Mood and Affect: Mood normal.        Behavior: Behavior normal.     ED Results / Procedures / Treatments   Labs (all labs ordered are listed, but only abnormal results are displayed) Labs Reviewed  BASIC METABOLIC PANEL - Abnormal; Notable for the following components:      Result Value   Glucose, Bld 147 (*)    All other components within normal limits  CBC WITH DIFFERENTIAL/PLATELET  URINALYSIS, ROUTINE W REFLEX MICROSCOPIC    EKG None  Radiology No results found.  Procedures Procedures    Medications Ordered in ED Medications  morphine (PF) 4 MG/ML injection 4 mg (has no administration in time range)  diazepam (VALIUM) injection 2.5 mg (has no administration in time range)  enoxaparin (LOVENOX) injection 75 mg (has no administration in  time range)  methylPREDNISolone sodium succinate (SOLU-MEDROL) 125 mg/2 mL injection 125 mg (125 mg Intravenous Given 08/27/22 1257)  diphenhydrAMINE (BENADRYL) injection 25 mg (25 mg Intravenous Given 08/27/22 1258)  famotidine (PEPCID) IVPB 20 mg premix (0 mg Intravenous Stopped 08/27/22 1330)  hydrocortisone cream 1 % ( Topical Given 08/27/22 1349)  morphine (  PF) 4 MG/ML injection 4 mg (4 mg Intravenous Given 08/27/22 1349)    ED Course/ Medical Decision Making/ A&P                             Medical Decision Making Amount and/or Complexity of Data Reviewed Labs: ordered.  Risk Prescription drug management.   This patient presents to the ED for concern of rash, this involves an extensive number of treatment options, and is a complaint that carries with it a high risk of complications and morbidity.  The differential diagnosis includes allergic rxn   Co morbidities that complicate the patient evaluation  lung cancer, gerd, bipolar d/o, and ambulatory dysfunction   Additional history obtained:  Additional history obtained from epic chart review External records from outside source obtained and reviewed including EMS report   Lab Tests:  I Ordered, and personally interpreted labs.  The pertinent results include:  bmp nl, cbc nl  Cardiac Monitoring:  The patient was maintained on a cardiac monitor.  I personally viewed and interpreted the cardiac monitored which showed an underlying rhythm of: nsr   Medicines ordered and prescription drug management:  I ordered medication including solumedrol, benadryl, pepcid  for allergic rxn  Reevaluation of the patient after these medicines showed that the patient improved I have reviewed the patients home medicines and have made adjustments as needed   Test Considered:  [place]   Critical Interventions:  Allergy tx   Problem List / ED Course:  BLE pain and swelling:  pt is at risk for DVT as she is wheelchair bound and has  pain.  She has good blood flow per aortogram in August.  No available now, so she's given a dose of lovenox and [place] ordered for tomorrow.  Pain treated with morphine.  She has hydrocodone 10s that she takes for chronic pain. Rash:  pt is allergic to sulfa, so may have a lasix allergy as well as there is some cross reaction to those drugs.  Lasix is to stop and she is to start hctz.   Reevaluation:  After the interventions noted above, I reevaluated the patient and found that they have :improved   Social Determinants of Health:  Lives at [facility]   Dispostion:  After consideration of the diagnostic results and the patients response to treatment, I feel that the patent would benefit from discharge with outpatient f/u.          Final Clinical Impression(s) / ED Diagnoses Final diagnoses:  Drug allergy  Peripheral edema    Rx / DC Orders ED Discharge Orders          Ordered    hydrochlorothiazide (HYDRODIURIL) 25 MG tablet  Daily        08/27/22 1521    predniSONE (DELTASONE) 50 MG tablet  Daily with breakfast        08/27/22 1521    hydrOXYzine (ATARAX) 25 MG tablet  Every 6 hours PRN        08/27/22 1521    pyrithione zinc (SELSUN BLUE ITCHY DRY SCALP) 1 % shampoo  Daily PRN        08/27/22 1521    US Venous Img Lower Bilateral        08/27/22 1525              Jacalyn Lefevre, MD 08/27/22 1531

## 2022-09-08 DIAGNOSIS — I82409 Acute embolism and thrombosis of unspecified deep veins of unspecified lower extremity: Secondary | ICD-10-CM | POA: Diagnosis present

## 2022-12-17 ENCOUNTER — Emergency Department (HOSPITAL_COMMUNITY)
Admission: EM | Admit: 2022-12-17 | Discharge: 2022-12-18 | Disposition: A | Discharge status: Skilled Nursing Facility | Payer: Medicare Other | Attending: Emergency Medicine | Admitting: Emergency Medicine

## 2022-12-17 ENCOUNTER — Other Ambulatory Visit: Payer: Self-pay

## 2022-12-17 ENCOUNTER — Encounter (HOSPITAL_COMMUNITY): Payer: Self-pay | Admitting: Pharmacy Technician

## 2022-12-17 DIAGNOSIS — R4182 Altered mental status, unspecified: Secondary | ICD-10-CM | POA: Insufficient documentation

## 2022-12-17 DIAGNOSIS — Z7984 Long term (current) use of oral hypoglycemic drugs: Secondary | ICD-10-CM | POA: Insufficient documentation

## 2022-12-17 DIAGNOSIS — I10 Essential (primary) hypertension: Secondary | ICD-10-CM | POA: Insufficient documentation

## 2022-12-17 DIAGNOSIS — Z79899 Other long term (current) drug therapy: Secondary | ICD-10-CM | POA: Diagnosis not present

## 2022-12-17 DIAGNOSIS — R45851 Suicidal ideations: Secondary | ICD-10-CM | POA: Insufficient documentation

## 2022-12-17 DIAGNOSIS — R7309 Other abnormal glucose: Secondary | ICD-10-CM

## 2022-12-17 DIAGNOSIS — E1165 Type 2 diabetes mellitus with hyperglycemia: Secondary | ICD-10-CM | POA: Diagnosis not present

## 2022-12-17 DIAGNOSIS — E876 Hypokalemia: Secondary | ICD-10-CM | POA: Diagnosis not present

## 2022-12-17 LAB — COMPREHENSIVE METABOLIC PANEL
ALT: 34 U/L (ref 0–44)
AST: 23 U/L (ref 15–41)
Albumin: 3.7 g/dL (ref 3.5–5.0)
Alkaline Phosphatase: 88 U/L (ref 38–126)
Anion gap: 10 (ref 5–15)
BUN: 5 mg/dL — ABNORMAL LOW (ref 8–23)
CO2: 29 mmol/L (ref 22–32)
Calcium: 9.1 mg/dL (ref 8.9–10.3)
Chloride: 94 mmol/L — ABNORMAL LOW (ref 98–111)
Creatinine, Ser: 0.43 mg/dL — ABNORMAL LOW (ref 0.44–1.00)
GFR, Estimated: >60 mL/min (ref >60)
Glucose, Bld: 291 mg/dL — ABNORMAL HIGH (ref 70–99)
Potassium: 2.9 mmol/L — ABNORMAL LOW (ref 3.5–5.1)
Sodium: 133 mmol/L — ABNORMAL LOW (ref 135–145)
Total Bilirubin: 0.7 mg/dL (ref 0.3–1.2)
Total Protein: 6.6 g/dL (ref 6.5–8.1)

## 2022-12-17 LAB — CBC WITH DIFFERENTIAL/PLATELET
Abs Immature Granulocytes: 0.01 10*3/uL (ref 0.00–0.07)
Basophils Absolute: 0 10*3/uL (ref 0.0–0.1)
Basophils Relative: 0 %
Eosinophils Absolute: 0.1 10*3/uL (ref 0.0–0.5)
Eosinophils Relative: 1 %
HCT: 48.6 % — ABNORMAL HIGH (ref 36.0–46.0)
Hemoglobin: 16.7 g/dL — ABNORMAL HIGH (ref 12.0–15.0)
Immature Granulocytes: 0 %
Lymphocytes Relative: 25 %
Lymphs Abs: 1.3 10*3/uL (ref 0.7–4.0)
MCH: 30.7 pg (ref 26.0–34.0)
MCHC: 34.4 g/dL (ref 30.0–36.0)
MCV: 89.3 fL (ref 80.0–100.0)
Monocytes Absolute: 0.4 10*3/uL (ref 0.1–1.0)
Monocytes Relative: 7 %
Neutro Abs: 3.4 10*3/uL (ref 1.7–7.7)
Neutrophils Relative %: 67 %
Platelets: 230 10*3/uL (ref 150–400)
RBC: 5.44 MIL/uL — ABNORMAL HIGH (ref 3.87–5.11)
RDW: 12.3 % (ref 11.5–15.5)
WBC: 5.1 10*3/uL (ref 4.0–10.5)
nRBC: 0 % (ref 0.0–0.2)

## 2022-12-17 LAB — CBG MONITORING, ED: Glucose-Capillary: 323 mg/dL — ABNORMAL HIGH (ref 70–99)

## 2022-12-17 LAB — ETHANOL: Alcohol, Ethyl (B): <10 mg/dL (ref <10)

## 2022-12-17 MED ORDER — POTASSIUM CHLORIDE ER 10 MEQ PO TBCR: 10.0000 meq | EXTENDED_RELEASE_TABLET | Freq: Every day | ORAL | 0 refills | Status: DC

## 2022-12-17 MED ORDER — METFORMIN HCL 500 MG PO TABS: ORAL_TABLET | ORAL | 0 refills | Status: AC

## 2022-12-17 MED ORDER — QUETIAPINE FUMARATE 100 MG PO TABS
100.0000 mg | ORAL_TABLET | Freq: Every day | ORAL | Status: DC
  Administered 2022-12-17: 100 mg via ORAL
  Filled 2022-12-17: qty 1

## 2022-12-17 MED ORDER — POTASSIUM CHLORIDE CRYS ER 20 MEQ PO TBCR
40.0000 meq | EXTENDED_RELEASE_TABLET | Freq: Once | ORAL | Status: AC
  Administered 2022-12-17: 40 meq via ORAL
  Filled 2022-12-17: qty 2

## 2022-12-17 MED ORDER — OXYCODONE-ACETAMINOPHEN 5-325 MG PO TABS
2.0000 | ORAL_TABLET | Freq: Once | ORAL | Status: AC
  Administered 2022-12-17: 2 via ORAL
  Filled 2022-12-17: qty 2

## 2022-12-17 MED ORDER — SODIUM CHLORIDE 0.9 % IV BOLUS
500.0000 mL | Freq: Once | INTRAVENOUS | Status: AC
  Administered 2022-12-17: 500 mL via INTRAVENOUS

## 2022-12-17 NOTE — Discharge Instructions (Signed)
Follow-up with your doctor next week to check your potassium and follow-up on your elevated sugar

## 2022-12-17 NOTE — ED Provider Notes (Signed)
[place] EMERGENCY DEPARTMENT AT [facility] Provider Note   CSN: [id number] Arrival date & time: 12/17/22  1324     History {Add pertinent medical, surgical, social history, OB history to HPI:1} Chief Complaint  Patient presents with   Psychiatric Evaluation    [name] is a 67 y.o. female.  Patient has a history of hypertension.  She was talking to her son on the phone and suggested that he go ahead and barrier.  She was sent over here for evaluation of suicidal ideations.  Patient tells me she is not suicidal and she does not want to hurt herself   Altered Mental Status      Home Medications Prior to Admission medications   Medication Sig Start Date End Date Taking? Authorizing Provider  metFORMIN (GLUCOPHAGE) 500 MG tablet Take 1 pill a day 12/17/22  Yes Bethann Berkshire, MD  potassium chloride (KLOR-CON) 10 MEQ tablet Take 1 tablet (10 mEq total) by mouth daily. 12/17/22  Yes Bethann Berkshire, MD  acetaminophen (TYLENOL) 325 MG tablet Take 650 mg by mouth every 4 (four) hours as needed for mild pain.    [provider]  ascorbic acid (VITAMIN C) 500 MG tablet Take 500 mg by mouth 2 (two) times daily.    [provider]  cefdinir (OMNICEF) 300 MG capsule Take 1 capsule (300 mg total) by mouth 2 (two) times daily. 12/29/21   Gilda Crease, MD  folic acid (FOLVITE) 1 MG tablet Take 1 mg by mouth daily.    [provider]  gabapentin (NEURONTIN) 100 MG capsule Take 300 mg by mouth 3 (three) times daily. 07/20/21   [provider]  hydrochlorothiazide (HYDRODIURIL) 25 MG tablet Take 1 tablet (25 mg total) by mouth daily. 08/27/22   Jacalyn Lefevre, MD  HYDROcodone-acetaminophen (NORCO) 10-325 MG tablet Take 1 tablet by mouth every 6 (six) hours.    [provider]  hydrOXYzine (ATARAX) 25 MG tablet Take 1 tablet (25 mg total) by mouth every 6 (six) hours as needed for itching. 08/27/22   Jacalyn Lefevre, MD  lidocaine  (LIDODERM) 5 % Place 1 patch onto the skin daily. Remove & Discard patch within 12 hours or as directed by MD; Apply to LOWER LEG    [provider]  lithium 300 MG tablet Take 300-600 mg by mouth See admin instructions. 300mg  in the am (0800), 600mg  in th evening (1800) 07/20/21   [provider]  Melatonin 10 MG TABS Take 10 mg by mouth at bedtime.    [provider]  midodrine (PROAMATINE) 10 MG tablet Take 1 tablet (10 mg total) by mouth 3 (three) times daily with meals. 08/03/21   Catarina Hartshorn, MD  Multiple Vitamin (MULTIVITAMIN ADULT PO) Take 1 tablet by mouth daily.    [provider]  Nutritional Supplements (NUTRITIONAL SUPPLEMENT PO) Take 120 mLs by mouth in the morning, at noon, and at bedtime. Wound Healing    [provider]  predniSONE (DELTASONE) 50 MG tablet Take 1 tablet (50 mg total) by mouth daily with breakfast. 08/27/22   Jacalyn Lefevre, MD  pyrithione zinc (SELSUN BLUE ITCHY DRY SCALP) 1 % shampoo Apply topically daily as needed for itching. 08/27/22   Jacalyn Lefevre, MD  QUEtiapine (SEROQUEL) 100 MG tablet Take 100 mg by mouth at bedtime. 07/20/21   [provider]  QUEtiapine (SEROQUEL) 25 MG tablet Take 25 mg by mouth every morning.    [provider]  saccharomyces boulardii (  FLORASTOR) 250 MG capsule Take 250 mg by mouth daily.    [provider]  tamsulosin (FLOMAX) 0.4 MG CAPS capsule Take 0.4 mg by mouth daily.    [provider]      Allergies    Sulfa antibiotics    Review of Systems   Review of Systems  Physical Exam Updated Vital Signs BP (!) 111/99   Pulse (!) 110   Temp 98.6 F (37 C)   Resp 18   SpO2 (!) 89%  Physical Exam  ED Results / Procedures / Treatments   Labs (all labs ordered are listed, but only abnormal results are displayed) Labs Reviewed  COMPREHENSIVE METABOLIC PANEL - Abnormal; Notable for the following components:      Result Value   Sodium 133 (*)     Potassium 2.9 (*)    Chloride 94 (*)    Glucose, Bld 291 (*)    BUN 5 (*)    Creatinine, Ser 0.43 (*)    All other components within normal limits  CBC WITH DIFFERENTIAL/PLATELET - Abnormal; Notable for the following components:   RBC 5.44 (*)    Hemoglobin 16.7 (*)    HCT 48.6 (*)    All other components within normal limits  CBG MONITORING, ED - Abnormal; Notable for the following components:   Glucose-Capillary 323 (*)    All other components within normal limits  ETHANOL  RAPID URINE DRUG SCREEN, HOSP PERFORMED  URINALYSIS, ROUTINE W REFLEX MICROSCOPIC    EKG None  Radiology No results found.  Procedures Procedures  {Document cardiac monitor, telemetry assessment procedure when appropriate:1}  Medications Ordered in ED Medications  potassium chloride SA (KLOR-CON M) CR tablet 40 mEq (has no administration in time range)  sodium chloride 0.9 % bolus 500 mL (500 mLs Intravenous New Bag/Given 12/17/22 1631)    ED Course/ Medical Decision Making/ A&P  Patient is not suicidal.  But she does have elevated glucose and low potassium. {   Click here for ABCD2, HEART and other calculatorsREFRESH Note before signing :1}                              Medical Decision Making Risk Prescription drug management.   Patient is started on potassium 10 mEq daily.  And she is also started on 500 mg of Glucophage for her elevated sugar.  {Document critical care time when appropriate:1} {Document review of labs and clinical decision tools ie heart score, Chads2Vasc2 etc:1}  {Document your independent review of radiology images, and any outside records:1} {Document your discussion with family members, caretakers, and with consultants:1} {Document social determinants of health affecting pt's care:1} {Document your decision making why or why not admission, treatments were needed:1} Final Clinical Impression(s) / ED Diagnoses Final diagnoses:  Hypokalemia  Elevated glucose    Rx / DC  Orders ED Discharge Orders          Ordered    metFORMIN (GLUCOPHAGE) 500 MG tablet        12/17/22 1632    potassium chloride (KLOR-CON) 10 MEQ tablet  Daily        12/17/22 1632

## 2022-12-17 NOTE — ED Notes (Signed)
Pt readjusted in stretcher. Peri care provided with new bed sheets and blanket. Provided with sandwich. Pt informed she continues to await transport back to facility.

## 2022-12-17 NOTE — ED Notes (Addendum)
Pt has been provided with a meal. Has personal cell phone within reach. Delay for transport explained to pt.

## 2022-12-17 NOTE — ED Triage Notes (Signed)
Pt bib ems from [facility] for a psych evaluation. Per ems, staff overheard pt arguing on the phone with her son and pt stated, just dig a hole and put me in it and let me just die. Pt found to have a CBG of 321 with no hx of diabetes. Denies SI/HI at present.

## 2023-01-24 ENCOUNTER — Emergency Department (HOSPITAL_COMMUNITY)
Admission: EM | Admit: 2023-01-24 | Discharge: 2023-01-25 | Disposition: A | Discharge status: Skilled Nursing Facility | Payer: Medicare Other | Attending: Emergency Medicine | Admitting: Emergency Medicine

## 2023-01-24 ENCOUNTER — Other Ambulatory Visit: Payer: Self-pay

## 2023-01-24 ENCOUNTER — Encounter (HOSPITAL_COMMUNITY): Payer: Self-pay

## 2023-01-24 ENCOUNTER — Emergency Department (HOSPITAL_COMMUNITY): Payer: Medicare Other

## 2023-01-24 DIAGNOSIS — E876 Hypokalemia: Secondary | ICD-10-CM | POA: Insufficient documentation

## 2023-01-24 DIAGNOSIS — K5900 Constipation, unspecified: Secondary | ICD-10-CM | POA: Diagnosis not present

## 2023-01-24 DIAGNOSIS — Z7984 Long term (current) use of oral hypoglycemic drugs: Secondary | ICD-10-CM | POA: Insufficient documentation

## 2023-01-24 DIAGNOSIS — R509 Fever, unspecified: Secondary | ICD-10-CM | POA: Diagnosis not present

## 2023-01-24 DIAGNOSIS — R4182 Altered mental status, unspecified: Secondary | ICD-10-CM | POA: Insufficient documentation

## 2023-01-24 DIAGNOSIS — U071 COVID-19: Secondary | ICD-10-CM | POA: Insufficient documentation

## 2023-01-24 DIAGNOSIS — Z7901 Long term (current) use of anticoagulants: Secondary | ICD-10-CM | POA: Insufficient documentation

## 2023-01-24 DIAGNOSIS — R300 Dysuria: Secondary | ICD-10-CM | POA: Insufficient documentation

## 2023-01-24 DIAGNOSIS — Z87891 Personal history of nicotine dependence: Secondary | ICD-10-CM | POA: Insufficient documentation

## 2023-01-24 DIAGNOSIS — M7989 Other specified soft tissue disorders: Secondary | ICD-10-CM | POA: Insufficient documentation

## 2023-01-24 DIAGNOSIS — R600 Localized edema: Secondary | ICD-10-CM | POA: Diagnosis present

## 2023-01-24 DIAGNOSIS — Z85118 Personal history of other malignant neoplasm of bronchus and lung: Secondary | ICD-10-CM | POA: Diagnosis not present

## 2023-01-24 LAB — CBC WITH DIFFERENTIAL/PLATELET
Abs Immature Granulocytes: 0.01 10*3/uL (ref 0.00–0.07)
Basophils Absolute: 0 10*3/uL (ref 0.0–0.1)
Basophils Relative: 0 %
Eosinophils Absolute: 0.1 10*3/uL (ref 0.0–0.5)
Eosinophils Relative: 2 %
HCT: 47.4 % — ABNORMAL HIGH (ref 36.0–46.0)
Hemoglobin: 16.1 g/dL — ABNORMAL HIGH (ref 12.0–15.0)
Immature Granulocytes: 0 %
Lymphocytes Relative: 23 %
Lymphs Abs: 1.5 10*3/uL (ref 0.7–4.0)
MCH: 30.3 pg (ref 26.0–34.0)
MCHC: 34 g/dL (ref 30.0–36.0)
MCV: 89.3 fL (ref 80.0–100.0)
Monocytes Absolute: 0.4 10*3/uL (ref 0.1–1.0)
Monocytes Relative: 6 %
Neutro Abs: 4.4 10*3/uL (ref 1.7–7.7)
Neutrophils Relative %: 69 %
Platelets: 255 10*3/uL (ref 150–400)
RBC: 5.31 MIL/uL — ABNORMAL HIGH (ref 3.87–5.11)
RDW: 12.6 % (ref 11.5–15.5)
WBC: 6.5 10*3/uL (ref 4.0–10.5)
nRBC: 0 % (ref 0.0–0.2)

## 2023-01-24 LAB — COMPREHENSIVE METABOLIC PANEL
ALT: 25 U/L (ref 0–44)
AST: 13 U/L — ABNORMAL LOW (ref 15–41)
Albumin: 3.6 g/dL (ref 3.5–5.0)
Alkaline Phosphatase: 113 U/L (ref 38–126)
Anion gap: 7 (ref 5–15)
BUN: 6 mg/dL — ABNORMAL LOW (ref 8–23)
CO2: 29 mmol/L (ref 22–32)
Calcium: 9 mg/dL (ref 8.9–10.3)
Chloride: 98 mmol/L (ref 98–111)
Creatinine, Ser: 0.44 mg/dL (ref 0.44–1.00)
GFR, Estimated: >60 mL/min (ref >60)
Glucose, Bld: 292 mg/dL — ABNORMAL HIGH (ref 70–99)
Potassium: 3.2 mmol/L — ABNORMAL LOW (ref 3.5–5.1)
Sodium: 134 mmol/L — ABNORMAL LOW (ref 135–145)
Total Bilirubin: 0.5 mg/dL (ref 0.3–1.2)
Total Protein: 6.8 g/dL (ref 6.5–8.1)

## 2023-01-24 LAB — URINALYSIS, ROUTINE W REFLEX MICROSCOPIC
Bilirubin Urine: NEGATIVE
Glucose, UA: 150 mg/dL — AB
Hgb urine dipstick: NEGATIVE
Ketones, ur: NEGATIVE mg/dL
Nitrite: NEGATIVE
Protein, ur: NEGATIVE mg/dL
Specific Gravity, Urine: >1.046 — ABNORMAL HIGH (ref 1.005–1.030)
Squamous Epithelial / HPF: >50 /HPF (ref 0–5)
WBC, UA: >50 WBC/hpf (ref 0–5)
pH: 6 (ref 5.0–8.0)

## 2023-01-24 LAB — LIPASE, BLOOD: Lipase: 36 U/L (ref 11–51)

## 2023-01-24 LAB — TROPONIN I (HIGH SENSITIVITY)
Troponin I (High Sensitivity): 3 ng/L (ref <18)
Troponin I (High Sensitivity): 3 ng/L (ref <18)

## 2023-01-24 LAB — SARS CORONAVIRUS 2 BY RT PCR: SARS Coronavirus 2 by RT PCR: POSITIVE — AB

## 2023-01-24 LAB — BRAIN NATRIURETIC PEPTIDE: B Natriuretic Peptide: 20 pg/mL (ref 0.0–100.0)

## 2023-01-24 MED ORDER — LORAZEPAM 1 MG PO TABS
1.0000 mg | ORAL_TABLET | Freq: Once | ORAL | Status: AC
  Administered 2023-01-24: 1 mg via ORAL
  Filled 2023-01-24: qty 1

## 2023-01-24 MED ORDER — POTASSIUM CHLORIDE CRYS ER 20 MEQ PO TBCR
40.0000 meq | EXTENDED_RELEASE_TABLET | Freq: Once | ORAL | Status: AC
  Administered 2023-01-24: 40 meq via ORAL
  Filled 2023-01-24: qty 2

## 2023-01-24 MED ORDER — CEPHALEXIN 500 MG PO CAPS: 500.0000 mg | ORAL_CAPSULE | Freq: Three times a day (TID) | ORAL | 0 refills | Status: AC

## 2023-01-24 MED ORDER — ONDANSETRON HCL 4 MG/2ML IJ SOLN
4.0000 mg | Freq: Once | INTRAMUSCULAR | Status: AC
  Administered 2023-01-24: 4 mg via INTRAVENOUS
  Filled 2023-01-24: qty 2

## 2023-01-24 MED ORDER — HYDROCODONE-ACETAMINOPHEN 5-325 MG PO TABS
2.0000 | ORAL_TABLET | Freq: Once | ORAL | Status: AC
  Administered 2023-01-24: 2 via ORAL
  Filled 2023-01-24: qty 2

## 2023-01-24 MED ORDER — CEPHALEXIN 500 MG PO CAPS: 500.0000 mg | ORAL_CAPSULE | Freq: Three times a day (TID) | ORAL | 0 refills | Status: DC

## 2023-01-24 MED ORDER — ALUM & MAG HYDROXIDE-SIMETH 200-200-20 MG/5ML PO SUSP
15.0000 mL | Freq: Once | ORAL | Status: AC
  Administered 2023-01-25: 15 mL via ORAL
  Filled 2023-01-24: qty 30

## 2023-01-24 MED ORDER — MORPHINE SULFATE (PF) 4 MG/ML IV SOLN
4.0000 mg | Freq: Once | INTRAVENOUS | Status: AC
  Administered 2023-01-24: 4 mg via INTRAVENOUS
  Filled 2023-01-24: qty 1

## 2023-01-24 MED ORDER — IOHEXOL 300 MG/ML  SOLN
100.0000 mL | Freq: Once | INTRAMUSCULAR | Status: AC | PRN
  Administered 2023-01-24: 100 mL via INTRAVENOUS

## 2023-01-24 NOTE — ED Notes (Signed)
Pt called out, Another Incontinent episode of Urine This NT waiting for assistance to chance pt  RN aware and will assist

## 2023-01-24 NOTE — ED Notes (Signed)
Patient transported to CT 

## 2023-01-24 NOTE — ED Triage Notes (Addendum)
EMS said "Patient was picked up at [facility] with concerns pt may have a UTI because of painful urination and lower abdomen pain. Patient has seemed more confused than normal. Patient also has left foot pain with pitting edema."

## 2023-01-24 NOTE — ED Notes (Signed)
Pt incontinent of urine. Pt cleaned up x2 NTs Clean brief applied and pad placed on bed

## 2023-01-24 NOTE — ED Provider Notes (Signed)
[place] EMERGENCY DEPARTMENT AT [facility] Provider Note  CSN: [id number] Arrival date & time: 01/24/23 1733  Chief Complaint(s) Dysuria and Foot Pain  HPI OSHA BORUTA is a 67 y.o. female with past medical history as below, significant for HLD, MDD, bipolar 1 disorder, Eagle syndrome, DJD, lung cancer who presents to the ED with complaint of concern for UTI, AMS, leg swelling  Patient arrives from [facility]  Patient with multiple complaints.  She reports progressively worsening coughing and mucus production, mild dyspnea worsened from baseline.  No chest pain.  She is compliant with her medications, no longer smoking she has history of lung cancer.  Patient also has lower extremity swelling, she feels is mildly worsened than prior, feels her legs intermittently swell throughout the day and subside, thinks it is mildly worse today  Also has suprapubic abdominal pain, urgency, dysuria, sensation of incomplete voiding, bilateral flank pain.  Subjective fever today.  No abnormal vaginal bleeding or discharge, no change in bowel habits.  She is also concerned about her chronic right ankle pain, she [facility] narcotics at [facility] is worried that she is become "immune" to her dose and needs it to be increased   Past Medical History Past Medical History:  Diagnosis Date   Allergy    Bipolar disorder (HCC)    Depression    Drug abuse (HCC)    history of, went to rehab 2015   GERD (gastroesophageal reflux disease)    Lung cancer (HCC)    lung ca dx 11/11- right upper lobe   Patient Active Problem List   Diagnosis Date Noted   Acute left ankle pain 12/13/2021   UTI (urinary tract infection) 08/03/2021   Hypernatremia 07/30/2021   Acute urinary retention 07/30/2021   Hypokalemia 07/30/2021   Hypotension 07/29/2021   Ankle wound, left 07/29/2021   Severe sepsis (HCC) 07/28/2021   Dyslipidemia 07/28/2021   Acute metabolic encephalopathy 07/28/2021   MDD (major  depressive disorder), recurrent severe, 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 Authorizing Provider  cetirizine (ZYRTEC) 10 MG tablet Take 10 mg by mouth daily.   Yes [provider]  folic acid (FOLVITE) 1 MG tablet Take 1 mg by mouth daily.   Yes [provider]  hydrochlorothiazide (HYDRODIURIL) 25 MG tablet Take 1 tablet (25 mg total) by mouth daily. 08/27/22  Yes Jacalyn Lefevre, MD  lactulose (CHRONULAC) 10 GM/15ML solution Take 30 mLs by mouth daily as needed for moderate constipation. 01/18/23  Yes [provider]  lidocaine (LIDODERM) 5 % Place 1 patch onto the skin daily. Remove & Discard patch within 12 hours or as directed by MD; Apply to LOWER LEG   Yes [provider]  lithium 600 MG capsule Take 600 mg by mouth daily. 01/15/23  Yes [provider]  Melatonin 10 MG TABS Take 10 mg by mouth at bedtime.   Yes [provider]  metFORMIN (GLUCOPHAGE) 500 MG tablet Take 1 pill a day 12/17/22  Yes Bethann Berkshire, MD  Multiple Vitamin (MULTIVITAMIN ADULT PO) Take 1 tablet by mouth daily.   Yes [provider]  OLANZapine (ZYPREXA) 5 MG tablet Take 5 mg by mouth at bedtime. 01/11/23  Yes [provider]  potassium chloride 20 MEQ/15ML (10%) SOLN Take 10 mEq by mouth daily. 12/20/22  Yes [provider]  QUEtiapine (SEROQUEL) 100 MG tablet Take 100 mg by mouth at bedtime. 07/20/21  Yes [provider]  QUEtiapine (SEROQUEL) 25 MG tablet Take 25 mg by mouth every morning.   Yes [provider]  saccharomyces boulardii (FLORASTOR) 250 MG capsule Take 250 mg by mouth daily.   Yes [provider]  tamsulosin (FLOMAX) 0.4 MG CAPS  capsule Take 0.4 mg by mouth daily.   Yes [provider]  zolpidem (AMBIEN) 5 MG tablet Take 5 mg by mouth at bedtime as needed. 01/15/23  Yes [provider]  acetaminophen (TYLENOL) 325 MG tablet Take 650 mg by mouth every 4 (four) hours as needed for mild pain.    [provider]  amoxicillin-clavulanate (AUGMENTIN) 875-125 MG tablet Take 1 tablet by mouth 2 (two) times daily. 10/16/22   [provider]  ascorbic acid (VITAMIN C) 500 MG tablet Take 500 mg by mouth 2 (two) times daily.    [provider]  busPIRone (BUSPAR) 5 MG tablet Take 5 mg by mouth 2 (two) times daily. 01/14/23   [provider]  cephALEXin (KEFLEX) 500 MG capsule Take 1 capsule (500 mg total) by mouth 3 (three) times daily for 10 days. 01/24/23 02/03/23  Sloan Leiter, DO  ELIQUIS 5 MG TABS tablet Take 5 mg by mouth 2 (two) times daily. 01/18/23   [provider]  furosemide (LASIX) 20 MG tablet Take 20 mg by mouth daily. 08/24/22   [provider]  gabapentin (NEURONTIN) 100 MG capsule Take 300 mg by mouth 3 (three) times daily. 07/20/21   [provider]  HYDROcodone-acetaminophen (NORCO) 10-325 MG tablet Take 1 tablet by mouth every 6 (six) hours.    [provider]  hydrOXYzine (ATARAX) 25 MG tablet Take 1 tablet (25 mg total) by mouth every 6 (six) hours as needed for itching. 08/27/22   Jacalyn Lefevre, MD  ipratropium-albuterol (DUONEB) 0.5-2.5 (3) MG/3ML SOLN Take by nebulization. 12/04/22   [provider]  Ketoconazole 2 % FOAM Apply topically. 01/12/23   [provider]  LAGEVRIO 200 MG CAPS capsule Take 4 capsules by mouth 2 (two) times daily. 01/09/23   [provider]  lithium 300 MG tablet Take 300-600 mg by mouth See admin instructions. 300mg  in the am (0800), 600mg  in th evening (1800) 07/20/21   [provider]  lithium carbonate 300 MG capsule Take 300 mg by mouth 2 (two) times daily. 11/24/22    [provider]  LORazepam (ATIVAN) 2 MG/ML injection Inject 2 mg into the vein every 4 (four) hours as needed for anxiety, seizure or sedation. 12/20/22   [provider]  midodrine (PROAMATINE) 10 MG tablet Take 1 tablet (10 mg total) by mouth 3 (three) times daily with meals. 08/03/21   Catarina Hartshorn, MD  nitrofurantoin, macrocrystal-monohydrate, (MACROBID) 100 MG capsule Take 100 mg by mouth 2 (two) times daily. 01/05/23   [provider]  Nutritional Supplements (NUTRITIONAL SUPPLEMENT PO) Take 120 mLs by mouth in the morning, at noon, and at bedtime. Wound Healing    [provider]  ondansetron (ZOFRAN) 4 MG tablet Take 4 mg by mouth every 8 (eight) hours as needed. 12/14/22   [provider]  oxyCODONE-acetaminophen (PERCOCET) 10-325 MG tablet Take 1 tablet by mouth every 4 (four) hours as needed. 01/23/23   [provider]  potassium chloride (  KLOR-CON) 10 MEQ tablet Take 1 tablet (10 mEq total) by mouth daily. 12/17/22   Bethann Berkshire, MD  Potassium Chloride ER 20 MEQ TBCR Take 1 tablet by mouth daily. 09/06/22   [provider]  predniSONE (DELTASONE) 50 MG tablet Take 1 tablet (50 mg total) by mouth daily with breakfast. 08/27/22   Jacalyn Lefevre, MD  pyrithione zinc (SELSUN BLUE ITCHY DRY SCALP) 1 % shampoo Apply topically daily as needed for itching. 08/27/22   Jacalyn Lefevre, MD  RELISTOR 12 MG/0.6ML SOLN injection Inject 8 mg into the skin every other day. 01/04/23   [provider]  triamcinolone cream (KENALOG) 0.5 % Apply 1 Application topically 2 (two) times daily. 10/07/22   [provider]                                                                                                                                    Past Surgical History Past Surgical History:  Procedure Laterality Date   ABDOMINAL AORTOGRAM W/LOWER EXTREMITY Bilateral 01/10/2022   Procedure: ABDOMINAL AORTOGRAM W/LOWER EXTREMITY;  Surgeon:  Nada Libman, MD;  Location: [facility];  Service: Cardiovascular;  Laterality: Bilateral;   ANKLE FRACTURE SURGERY Left    HARDWARE REMOVAL Left 12/13/2021   Procedure: LEFT ANKLE HARDWARE REMOVAL;  Surgeon: Marcene Corning, MD;  Location: [facility];  Service: Orthopedics;  Laterality: Left;   LUNG LOBECTOMY Right 2011   RUL removed for lung cancer   STYLOID PROCESS EXCISION Left 10/16/2014   Procedure: EXCISION LEFT STYLOID PROCESS;  Surgeon: Drema Halon, MD;  Location: [facility];  Service: ENT;  Laterality: Left;   TONSILLECTOMY Bilateral 10/16/2014   Procedure: BILATERAL TONSILLECTOMY;  Surgeon: Drema Halon, MD;  Location: [facility];  Service: ENT;  Laterality: Bilateral;   TUBAL LIGATION  1984   Family History Family History  Problem Relation Age of Onset   Cancer Mother        breast cancer   Cancer Father        stomach cancer   Diabetes Father    Cancer Sister        breast cancer   Diabetes Brother    Diabetes Brother    Cancer Sister        breast cancer    Social History Social History   Tobacco Use   Smoking status: Former    Current packs/day: 1.00    Average packs/day: 1 pack/day for 40.0 years (40.0 ttl pk-yrs)    Types: Cigarettes   Smokeless tobacco: Never  Vaping Use   Vaping status: Former  Substance Use Topics   Alcohol use: Not Currently    Comment: hx alcoholism.  none since 2017   Drug use: Not Currently    Types: Benzodiazepines    Comment: hx of xanax abuse. last use 2015   Allergies Sulfa antibiotics  Review of Systems Review of  Systems  Constitutional:  Negative for chills and fever.  Respiratory:  Positive for cough and shortness of breath.   Cardiovascular:  Positive for leg swelling. Negative for chest pain and palpitations.  Gastrointestinal:  Positive for abdominal pain. Negative for nausea and vomiting.  Genitourinary:  Positive for dysuria, flank pain and urgency.   Musculoskeletal:  Positive for arthralgias.  Neurological:  Negative for syncope and headaches.    Physical Exam Vital Signs  I have reviewed the triage vital signs BP 123/87   Pulse 85   Temp 99.8 F (37.7 C) (Oral)   Resp 18   Ht 5\' 9"  (1.753 m)   Wt 96.2 kg   SpO2 97%   BMI 31.31 kg/m  Physical Exam Vitals and nursing note reviewed.  Constitutional:      General: She is not in acute distress.    Appearance: Normal appearance.  HENT:     Head: Normocephalic and atraumatic.     Right Ear: External ear normal.     Left Ear: External ear normal.     Nose: Nose normal.     Mouth/Throat:     Mouth: Mucous membranes are moist.  Eyes:     General: No scleral icterus.       Right eye: No discharge.        Left eye: No discharge.  Cardiovascular:     Rate and Rhythm: Normal rate and regular rhythm.     Pulses: Normal pulses.     Heart sounds: Normal heart sounds.  Pulmonary:     Effort: Pulmonary effort is normal. No respiratory distress.     Breath sounds: Normal breath sounds. No stridor.  Abdominal:     General: Abdomen is flat. There is no distension.     Palpations: Abdomen is soft.     Tenderness: There is abdominal tenderness. There is no guarding or rebound.    Musculoskeletal:     Cervical back: No rigidity.     Right lower leg: Edema present.     Left lower leg: Edema present.     Comments: Apparent drop foot right   Skin:    General: Skin is warm and dry.     Capillary Refill: Capillary refill takes less than 2 seconds.  Neurological:     Mental Status: She is alert.  Psychiatric:        Mood and Affect: Mood normal.        Behavior: Behavior normal. Behavior is cooperative.     ED Results and Treatments Labs (all labs ordered are listed, but only abnormal results are displayed) Labs Reviewed  SARS CORONAVIRUS 2 BY RT PCR - Abnormal; Notable for the following components:      Result Value   SARS Coronavirus 2 by RT PCR POSITIVE (*)    All  other components within normal limits  CBC WITH DIFFERENTIAL/PLATELET - Abnormal; Notable for the following components:   RBC 5.31 (*)    Hemoglobin 16.1 (*)    HCT 47.4 (*)    All other components within normal limits  COMPREHENSIVE METABOLIC PANEL - Abnormal; Notable for the following components:   Sodium 134 (*)    Potassium 3.2 (*)    Glucose, Bld 292 (*)    BUN 6 (*)    AST 13 (*)    All other components within normal limits  URINALYSIS, ROUTINE W REFLEX MICROSCOPIC - Abnormal; Notable for the following components:   APPearance CLOUDY (*)    Specific Gravity, Urine >  1.046 (*)    Glucose, UA 150 (*)    Leukocytes,Ua LARGE (*)    Bacteria, UA FEW (*)    All other components within normal limits  URINE CULTURE  LIPASE, BLOOD  BRAIN NATRIURETIC PEPTIDE  TROPONIN I (HIGH SENSITIVITY)  TROPONIN I (HIGH SENSITIVITY)                                                                                                                          Radiology CT ABDOMEN PELVIS W CONTRAST  Result Date: 01/24/2023 CLINICAL DATA:  Lower abdominal pain with diarrhea and constipation. Suprapubic/flank pain with dysuria. EXAM: CT ABDOMEN AND PELVIS WITH CONTRAST TECHNIQUE: Multidetector CT imaging of the abdomen and pelvis was performed using the standard protocol following bolus administration of intravenous contrast. RADIATION DOSE REDUCTION: This exam was performed according to the departmental dose-optimization program which includes automated exposure control, adjustment of the mA and/or kV according to patient size and/or use of iterative reconstruction technique. CONTRAST:  OMNIPAQUE IOHEXOL 300 MG/ML  SOLN COMPARISON:  09/05/2013, 09/01/2011. FINDINGS: Lower chest: Strandy atelectasis is present at the left lung base. Hepatobiliary: No focal liver abnormality is seen. Fatty infiltration of the liver is noted. No gallstones, gallbladder wall thickening, or biliary dilatation. Pancreas: Pancreatic  atrophy is noted. No pancreatic ductal dilatation or surrounding inflammatory changes. Spleen: Normal in size without focal abnormality. Adrenals/Urinary Tract: The adrenal glands are within normal limits. A stable complex lesion is noted along the medial aspect of the left kidney measuring 2.7 cm. Subcentimeter hypodensity is noted in the left kidney which is too small to further characterize. The kidneys enhance symmetrically. Nonobstructive renal calculi are noted on the left. There is no obstructive uropathy. The bladder is unremarkable. Stomach/Bowel: There is a small hiatal hernia. Stomach is within normal limits. Appendix appears normal. No bowel obstruction, free air or pneumatosis. A moderate amount of retained stool is present in the colon and rectum. No acute inflammatory changes are seen. There is inferior displacement of the rectum below the pubococcygeal line suggesting pelvic floor dysfunction. Vascular/Lymphatic: Aortic atherosclerosis. No enlarged abdominal or pelvic lymph nodes. Reproductive: Uterus and bilateral adnexa are unremarkable. Other: No abdominopelvic ascites. A fat containing umbilical hernia is noted. Musculoskeletal: A lipoma is noted in the abductor muscles at the left hip. Degenerative changes are present in the thoracolumbar spine. No acute osseous abnormality is seen. IMPRESSION: 1. Moderate amount of retained stool in the colon and rectum, compatible with history of constipation. There is inferior subluxation of the rectum below the pubococcygeal line, compatible with pelvic floor dysfunction which may also be associated with constipation. 2. Hepatic steatosis. 3. Complex lesion in the mid left kidney which is unchanged from 2013 with history of previous biopsy proven angiomyolipoma. 4. Nonobstructive left renal calculi. 5. Small hiatal hernia. 6. Aortic atherosclerosis. Electronically Signed   By: Thornell Sartorius M.D.   On: 01/24/2023 21:51   DG Chest 2 View  Result Date:  01/24/2023 CLINICAL  DATA:  Cough. EXAM: CHEST - 2 VIEW COMPARISON:  December 28, 2021. FINDINGS: The heart size and mediastinal contours are within normal limits. Mild left basilar subsegmental atelectasis. Right lung is clear. The visualized skeletal structures are unremarkable. IMPRESSION: Mild left basilar subsegmental atelectasis. Electronically Signed   By: Lupita Raider M.D.   On: 01/24/2023 20:06    Pertinent labs & imaging results that were available during my care of the patient were reviewed by me and considered in my medical decision making (see MDM for details).  Medications Ordered in ED Medications  alum & mag hydroxide-simeth (MAALOX/MYLANTA) 200-200-20 MG/5ML suspension 15 mL (has no administration in time range)  morphine (PF) 4 MG/ML injection 4 mg (4 mg Intravenous Given 01/24/23 2028)  ondansetron (ZOFRAN) injection 4 mg (4 mg Intravenous Given 01/24/23 2028)  iohexol (OMNIPAQUE) 300 MG/ML solution 100 mL (100 mLs Intravenous Contrast Given 01/24/23 2056)  potassium chloride SA (KLOR-CON M) CR tablet 40 mEq (40 mEq Oral Given 01/24/23 2204)  HYDROcodone-acetaminophen (NORCO/VICODIN) 5-325 MG per tablet 2 tablet (2 tablets Oral Given 01/24/23 2216)  LORazepam (ATIVAN) tablet 1 mg (1 mg Oral Given 01/24/23 2338)                                                                                                                                     Procedures Procedures  (including critical care time)  Medical Decision Making / ED Course    Medical Decision Making:    LEILI KAYLOR is a 67 y.o. female with past medical history as below, significant for HLD, MDD, bipolar 1 disorder, Eagle syndrome, DJD, lung cancer who presents to the ED with complaint of concern for UTI, AMS, leg swelling. The complaint involves an extensive differential diagnosis and also carries with it a high risk of complications and morbidity.  Serious etiology was considered. Ddx includes but is not limited to:  Differential diagnosis includes but is not exclusive to ectopic pregnancy, ovarian cyst, ovarian torsion, acute appendicitis, urinary tract infection, endometriosis, bowel obstruction, hernia, colitis, renal colic, gastroenteritis, volvulus etc. In my evaluation of this patient's dyspnea my DDx includes, but is not limited to, pneumonia, pulmonary embolism, pneumothorax, pulmonary edema, metabolic acidosis, asthma, COPD, cardiac cause, anemia, anxiety, etc.   Complete initial physical exam performed, notably the patient  was no acute distress, no hypoxia, she does have a low-grade fever 9.8. Marland Kitchen    Reviewed and confirmed nursing documentation for past medical history, family history, social history.  Vital signs reviewed.    Clinical Course as of 01/24/23 2342  Wed Jan 24, 2023  2159 Potassium(!): 3.2 Replace orally [SG]  2159 SARS Coronavirus 2 by RT PCR(!): POSITIVE Per patient was diagnosed around 2 weeks ago [SG]  2159 CT imaging concerning for constipation, otherwise stable. [SG]    Clinical Course User Index [SG] Tanda Rockers A, DO     Feeling better  Dirty UA,  she is having symptoms, will send ctx and cover with abx  Tolerating PO  Workup o/w stable, feeling better, tolerating PO, symptoms appear chronic and perhaps exacerbated by recent covid 19 infection  She is edema to both of her lower extremities, appears chronic in nature.  Per patient chronic, she has very limited ambulation, favor insufficiency, recommend compression stockings, follow-up with podiatry/PCP  Given constipation instructions, favor this is likely due to her chronic opiate use; no evidence of obstruction  stable for dc back to facility    The patient improved significantly and was discharged in stable condition. Detailed discussions were had with the patient regarding current findings, and need for close f/u with PCP or on call doctor. The patient has been instructed to return immediately if the symptoms  worsen in any way for re-evaluation. Patient verbalized understanding and is in agreement with current care plan. All questions answered prior to discharge.                 Additional history obtained: -Additional history obtained from na -External records from outside source obtained and reviewed including: Chart review including previous notes, labs, imaging, consultation notes including  Prior ED visits, prior urine cultures, home medications, prior labs and imaging   Lab Tests: -I ordered, reviewed, and interpreted labs.   The pertinent results include:   Labs Reviewed  SARS CORONAVIRUS 2 BY RT PCR - Abnormal; Notable for the following components:      Result Value   SARS Coronavirus 2 by RT PCR POSITIVE (*)    All other components within normal limits  CBC WITH DIFFERENTIAL/PLATELET - Abnormal; Notable for the following components:   RBC 5.31 (*)    Hemoglobin 16.1 (*)    HCT 47.4 (*)    All other components within normal limits  COMPREHENSIVE METABOLIC PANEL - Abnormal; Notable for the following components:   Sodium 134 (*)    Potassium 3.2 (*)    Glucose, Bld 292 (*)    BUN 6 (*)    AST 13 (*)    All other components within normal limits  URINALYSIS, ROUTINE W REFLEX MICROSCOPIC - Abnormal; Notable for the following components:   APPearance CLOUDY (*)    Specific Gravity, Urine >1.046 (*)    Glucose, UA 150 (*)    Leukocytes,Ua LARGE (*)    Bacteria, UA FEW (*)    All other components within normal limits  URINE CULTURE  LIPASE, BLOOD  BRAIN NATRIURETIC PEPTIDE  TROPONIN I (HIGH SENSITIVITY)  TROPONIN I (HIGH SENSITIVITY)    Notable for questionable UTI, dirty catch.  EKG   EKG Interpretation Date/Time:    Ventricular Rate:    PR Interval:    QRS Duration:    QT Interval:    QTC Calculation:   R Axis:      Text Interpretation:           Imaging Studies ordered: I ordered imaging studies including chest x-ray, CT abdomen pelvis I  independently visualized the following imaging with scope of interpretation limited to determining acute life threatening conditions related to emergency care; findings noted above, significant for stable imaging I independently visualized and interpreted imaging. I agree with the radiologist interpretation   Medicines ordered and prescription drug management: Meds ordered this encounter  Medications   morphine (PF) 4 MG/ML injection 4 mg   ondansetron (ZOFRAN) injection 4 mg   iohexol (OMNIPAQUE) 300 MG/ML solution 100 mL   potassium chloride SA (KLOR-CON M) CR tablet  40 mEq   HYDROcodone-acetaminophen (NORCO/VICODIN) 5-325 MG per tablet 2 tablet   LORazepam (ATIVAN) tablet 1 mg   DISCONTD: cephALEXin (KEFLEX) 500 MG capsule    Sig: Take 1 capsule (500 mg total) by mouth 3 (three) times daily for 7 days.    Dispense:  21 capsule    Refill:  0   cephALEXin (KEFLEX) 500 MG capsule    Sig: Take 1 capsule (500 mg total) by mouth 3 (three) times daily for 10 days.    Dispense:  30 capsule    Refill:  0   alum & mag hydroxide-simeth (MAALOX/MYLANTA) 200-200-20 MG/5ML suspension 15 mL    -I have reviewed the patients home medicines and have made adjustments as needed   Consultations Obtained: na   Cardiac Monitoring: The patient was maintained on a cardiac monitor.  I personally viewed and interpreted the cardiac monitored which showed an underlying rhythm of: NSR  Social Determinants of Health:  Diagnosis or treatment significantly limited by social determinants of health: former smoker and obesity   Reevaluation: After the interventions noted above, I reevaluated the patient and found that they have improved  Co morbidities that complicate the patient evaluation  Past Medical History:  Diagnosis Date   Allergy    Bipolar disorder (HCC)    Depression    Drug abuse (HCC)    history of, went to rehab 2015   GERD (gastroesophageal reflux disease)    Lung cancer (HCC)     lung ca dx 11/11- right upper lobe      Dispostion: Disposition decision including need for hospitalization was considered, and patient discharged from emergency department.    Final Clinical Impression(s) / ED Diagnoses Final diagnoses:  Dysuria  Constipation, unspecified constipation type        Sloan Leiter, DO 01/24/23 2343

## 2023-01-24 NOTE — ED Notes (Signed)
ED Provider at bedside. 

## 2023-01-24 NOTE — Discharge Instructions (Addendum)
Please follow up with your primary care doctor within 2-3 days. For constipation we also recommend a diet high in fiber (beans, fruits, vegetables, whole grains). Take Colace 100-200 mg up to three times per day. You may take along with Senokot 1-2 tabs, ingest with full glass of water.  You may also take MiraLAX 1-2 capfuls 1-2 times a day until stools become soft and then slowly decrease the amount of MiraLAX used.  Maintain fluid intake 6-8 glasses per day. Please increase fibers in your diet. You may also take Milk of Magnesia 30 mL as needed for constipation, you may repeat in 2 hours again if no bowl movement.  It was a pleasure caring for you today in the emergency department.  Please return to the emergency department for any worsening or worrisome symptoms.  

## 2023-01-24 NOTE — ED Notes (Signed)
Pt given turkey sandwich and sprite.  

## 2023-01-24 NOTE — ED Notes (Signed)
Notified [place] [facility] of patient needing transportation back to [facility]. 

## 2023-01-25 DIAGNOSIS — R300 Dysuria: Secondary | ICD-10-CM | POA: Diagnosis not present

## 2023-01-26 LAB — URINE CULTURE

## 2023-01-29 ENCOUNTER — Other Ambulatory Visit: Payer: Self-pay

## 2023-01-29 ENCOUNTER — Encounter (HOSPITAL_COMMUNITY): Payer: Self-pay | Admitting: *Deleted

## 2023-01-29 ENCOUNTER — Emergency Department (HOSPITAL_COMMUNITY)
Admission: EM | Admit: 2023-01-29 | Discharge: 2023-01-29 | Disposition: A | Discharge status: Home / Self Care | Payer: Medicare Other | Attending: Emergency Medicine | Admitting: Emergency Medicine

## 2023-01-29 DIAGNOSIS — X58XXXA Exposure to other specified factors, initial encounter: Secondary | ICD-10-CM | POA: Diagnosis not present

## 2023-01-29 DIAGNOSIS — I2510 Atherosclerotic heart disease of native coronary artery without angina pectoris: Secondary | ICD-10-CM | POA: Insufficient documentation

## 2023-01-29 DIAGNOSIS — Z7901 Long term (current) use of anticoagulants: Secondary | ICD-10-CM | POA: Insufficient documentation

## 2023-01-29 DIAGNOSIS — H60501 Unspecified acute noninfective otitis externa, right ear: Secondary | ICD-10-CM

## 2023-01-29 DIAGNOSIS — H9201 Otalgia, right ear: Secondary | ICD-10-CM | POA: Diagnosis present

## 2023-01-29 DIAGNOSIS — T161XXA Foreign body in right ear, initial encounter: Secondary | ICD-10-CM | POA: Insufficient documentation

## 2023-01-29 DIAGNOSIS — Z85118 Personal history of other malignant neoplasm of bronchus and lung: Secondary | ICD-10-CM | POA: Diagnosis not present

## 2023-01-29 DIAGNOSIS — H6091 Unspecified otitis externa, right ear: Secondary | ICD-10-CM | POA: Insufficient documentation

## 2023-01-29 MED ORDER — CIPRO HC 0.2-1 % OT SUSP: 3.0000 [drp] | Freq: Two times a day (BID) | OTIC | 0 refills | Status: AC

## 2023-01-29 NOTE — ED Notes (Signed)
Pt a/o. Pa pulled black ear piece out of Right ear. Nad. Ems called for transport back to [facility], pt aware

## 2023-01-29 NOTE — ED Notes (Signed)
Attempted report x 3 calls to [facility], no one answered. Pt dc with [facility] with dc papers. Nad.

## 2023-01-29 NOTE — Discharge Instructions (Addendum)
You were seen in the emergency room today for a foreign body in your right ear canal which was removed.  You do have some swelling and redness of the canal and are being treated with antibiotic drops for your ear.  This should help with the swelling and pain.  If you are not having improvement over the next several days follow-up with ENT.  Come back to ER if you have severe pain, fever, drainage from the ear.

## 2023-01-29 NOTE — ED Provider Notes (Signed)
[place] EMERGENCY DEPARTMENT AT [facility] Provider Note   CSN: [id number] Arrival date & time: 01/29/23  7829     History  Chief Complaint  Patient presents with   Foreign Body in Ear    [name] is a 67 y.o. female.  She has PMH of CAD, lung cancer, bipolar disorder, presents the ER for right ear pain and muffled hearing x 1 month.  Has been complaining about at her nursing facility, noticed to have the tip of an earbud stuck in her ear and was not able to be removed by the PA at the facility.  Denies fever or chills or drainage but does have soreness to that ear to the touch.  Of note this was removed with forceps by nursing prior to my evaluation  HPI     Home Medications Prior to Admission medications   Medication Sig Start Date End Date Taking? Authorizing Provider  acetaminophen (TYLENOL) 325 MG tablet Take 650 mg by mouth every 4 (four) hours as needed for mild pain.    [provider]  amoxicillin-clavulanate (AUGMENTIN) 875-125 MG tablet Take 1 tablet by mouth 2 (two) times daily. 10/16/22   [provider]  ascorbic acid (VITAMIN C) 500 MG tablet Take 500 mg by mouth 2 (two) times daily.    [provider]  busPIRone (BUSPAR) 5 MG tablet Take 5 mg by mouth 2 (two) times daily. 01/14/23   [provider]  cephALEXin (KEFLEX) 500 MG capsule Take 1 capsule (500 mg total) by mouth 3 (three) times daily for 10 days. 01/24/23 02/03/23  Sloan Leiter, DO  cetirizine (ZYRTEC) 10 MG tablet Take 10 mg by mouth daily.    [provider]  ciprofloxacin-hydrocortisone (CIPRO HC) OTIC suspension Place 3 drops into the right ear 2 (two) times daily for 7 days. 01/29/23 02/05/23 Yes Lona Six A, PA-C  ELIQUIS 5 MG TABS tablet Take 5 mg by mouth 2 (two) times daily. 01/18/23   [provider]  folic acid (FOLVITE) 1 MG tablet Take 1 mg by mouth daily.    [provider]  furosemide (LASIX) 20 MG tablet Take 20  mg by mouth daily. 08/24/22   [provider]  gabapentin (NEURONTIN) 100 MG capsule Take 300 mg by mouth 3 (three) times daily. 07/20/21   [provider]  hydrochlorothiazide (HYDRODIURIL) 25 MG tablet Take 1 tablet (25 mg total) by mouth daily. 08/27/22   Jacalyn Lefevre, MD  HYDROcodone-acetaminophen (NORCO) 10-325 MG tablet Take 1 tablet by mouth every 6 (six) hours.    [provider]  hydrOXYzine (ATARAX) 25 MG tablet Take 1 tablet (25 mg total) by mouth every 6 (six) hours as needed for itching. 08/27/22   Jacalyn Lefevre, MD  ipratropium-albuterol (DUONEB) 0.5-2.5 (3) MG/3ML SOLN Take by nebulization. 12/04/22   [provider]  Ketoconazole 2 % FOAM Apply topically. 01/12/23   [provider]  lactulose (CHRONULAC) 10 GM/15ML solution Take 30 mLs by mouth daily as needed for moderate constipation. 01/18/23   [provider]  LAGEVRIO 200 MG CAPS capsule Take 4 capsules by mouth 2 (two) times daily. 01/09/23   [provider]  lidocaine (LIDODERM) 5 % Place 1 patch onto the skin daily. Remove & Discard patch within 12 hours or as directed by MD; Apply to LOWER LEG    [provider]  lithium 300 MG tablet Take 300-600 mg by mouth See admin instructions. 300mg  in the am (0800),  600mg  in th evening (1800) 07/20/21   [provider]  lithium 600 MG capsule Take 600 mg by mouth daily. 01/15/23   [provider]  lithium carbonate 300 MG capsule Take 300 mg by mouth 2 (two) times daily. 11/24/22   [provider]  LORazepam (ATIVAN) 2 MG/ML injection Inject 2 mg into the vein every 4 (four) hours as needed for anxiety, seizure or sedation. 12/20/22   [provider]  Melatonin 10 MG TABS Take 10 mg by mouth at bedtime.    [provider]  metFORMIN (GLUCOPHAGE) 500 MG tablet Take 1 pill a day 12/17/22   Bethann Berkshire, MD  midodrine (PROAMATINE) 10 MG tablet Take 1 tablet (10 mg total) by mouth 3  (three) times daily with meals. 08/03/21   Catarina Hartshorn, MD  Multiple Vitamin (MULTIVITAMIN ADULT PO) Take 1 tablet by mouth daily.    [provider]  nitrofurantoin, macrocrystal-monohydrate, (MACROBID) 100 MG capsule Take 100 mg by mouth 2 (two) times daily. 01/05/23   [provider]  Nutritional Supplements (NUTRITIONAL SUPPLEMENT PO) Take 120 mLs by mouth in the morning, at noon, and at bedtime. Wound Healing    [provider]  OLANZapine (ZYPREXA) 5 MG tablet Take 5 mg by mouth at bedtime. 01/11/23   [provider]  ondansetron (ZOFRAN) 4 MG tablet Take 4 mg by mouth every 8 (eight) hours as needed. 12/14/22   [provider]  oxyCODONE-acetaminophen (PERCOCET) 10-325 MG tablet Take 1 tablet by mouth every 4 (four) hours as needed. 01/23/23   [provider]  potassium chloride (KLOR-CON) 10 MEQ tablet Take 1 tablet (10 mEq total) by mouth daily. 12/17/22   Bethann Berkshire, MD  potassium chloride 20 MEQ/15ML (10%) SOLN Take 10 mEq by mouth daily. 12/20/22   [provider]  Potassium Chloride ER 20 MEQ TBCR Take 1 tablet by mouth daily. 09/06/22   [provider]  predniSONE (DELTASONE) 50 MG tablet Take 1 tablet (50 mg total) by mouth daily with breakfast. 08/27/22   Jacalyn Lefevre, MD  pyrithione zinc (SELSUN BLUE ITCHY DRY SCALP) 1 % shampoo Apply topically daily as needed for itching. 08/27/22   Jacalyn Lefevre, MD  QUEtiapine (SEROQUEL) 100 MG tablet Take 100 mg by mouth at bedtime. 07/20/21   [provider]  QUEtiapine (SEROQUEL) 25 MG tablet Take 25 mg by mouth every morning.    [provider]  RELISTOR 12 MG/0.6ML SOLN injection Inject 8 mg into the skin every other day. 01/04/23   [provider]  saccharomyces boulardii (FLORASTOR) 250 MG capsule Take 250 mg by mouth daily.    [provider]  tamsulosin (FLOMAX) 0.4 MG CAPS capsule Take 0.4 mg by mouth daily.    [provider]   triamcinolone cream (KENALOG) 0.5 % Apply 1 Application topically 2 (two) times daily. 10/07/22   [provider]  zolpidem (AMBIEN) 5 MG tablet Take 5 mg by mouth at bedtime as needed. 01/15/23   [provider]      Allergies    Sulfa antibiotics    Review of Systems   Review of Systems  Physical Exam Updated Vital Signs BP 103/79 (BP Location: Left Arm)   Pulse 89   Temp 98.1 F (36.7 C) (Oral)   Resp 16   Ht 5\' 9"  (1.753 m)   Wt 96.2 kg   SpO2 96%   BMI 31.31 kg/m  Physical Exam Vitals and nursing note reviewed.  Constitutional:  General: She is not in acute distress.    Appearance: She is well-developed.  HENT:     Head: Normocephalic and atraumatic.     Right Ear: Tympanic membrane normal. Swelling and tenderness present. No drainage. There is no impacted cerumen. No mastoid tenderness.     Left Ear: Tympanic membrane normal. There is no impacted cerumen. No foreign body. No mastoid tenderness.     Ears:     Comments: Swelling noted to right ear canal with erythema, tenderness to the tragus and pinna. Eyes:     Conjunctiva/sclera: Conjunctivae normal.  Cardiovascular:     Rate and Rhythm: Normal rate and regular rhythm.     Heart sounds: No murmur heard. Pulmonary:     Effort: Pulmonary effort is normal. No respiratory distress.     Breath sounds: Normal breath sounds.  Abdominal:     Palpations: Abdomen is soft.     Tenderness: There is no abdominal tenderness.  Musculoskeletal:        General: No swelling.     Cervical back: Neck supple.  Skin:    General: Skin is warm and dry.     Capillary Refill: Capillary refill takes less than 2 seconds.  Neurological:     Mental Status: She is alert.  Psychiatric:        Mood and Affect: Mood normal.   f  ED Results / Procedures / Treatments   Labs (all labs ordered are listed, but only abnormal results are displayed) Labs Reviewed - No data to display  EKG None  Radiology No results  found.  Procedures Procedures    Medications Ordered in ED Medications - No data to display  ED Course/ Medical Decision Making/ A&P                                 Medical Decision Making Ddx: Otitis media, otitis externa, malignant otitis externa, mastoiditis, foreign body of ear canal, other ED course: Patient presents for foreign body to the right ear, has noted for the past month she has been having muffled hearing, noted at the nursing home to have foreign body not able to be removed by the physician assistant there.  It was removed here by nursing before I was able to evaluate the patient, was a small black rubber tip of an earbud patient uses for hearing aid.  On inspection of her ear canal is erythematous and mildly swollen with no drainage, TM intact bilaterally.  Will treat with supportive HC drops for otitis externa and have her follow-up with primary care, given ENT follow-up if not improving.  Instructed on return precautions.           Final Clinical Impression(s) / ED Diagnoses Final diagnoses:  Acute foreign body of right ear canal, initial encounter  Acute otitis externa of right ear, unspecified type    Rx / DC Orders ED Discharge Orders          Ordered    ciprofloxacin-hydrocortisone (CIPRO HC) OTIC suspension  2 times daily        01/29/23 0920              Ma Rings, PA-C 01/29/23 0924    Loetta Rough, MD 01/29/23 1554

## 2023-01-29 NOTE — ED Triage Notes (Signed)
Pt BIB [facility] from [facility] for foreign object in right ear; pt states x one month she has felt a fullness in her ear; pt states she has been missing her earbud x one month and thinks she has been pushing it further in her ear by using Q-tips  Pt states the NP at the facility saw her this am but was unable to remove the object

## 2023-02-13 ENCOUNTER — Encounter (HOSPITAL_COMMUNITY): Payer: Self-pay | Admitting: *Deleted

## 2023-02-13 ENCOUNTER — Emergency Department (HOSPITAL_COMMUNITY): Payer: Medicare Other

## 2023-02-13 ENCOUNTER — Other Ambulatory Visit: Payer: Self-pay

## 2023-02-13 ENCOUNTER — Inpatient Hospital Stay (HOSPITAL_COMMUNITY)
Admission: EM | Admit: 2023-02-13 | Discharge: 2023-02-16 | DRG: 871 | Disposition: A | Discharge status: Skilled Nursing Facility | Payer: Medicare Other | Source: Skilled Nursing Facility | Attending: Family Medicine | Admitting: Family Medicine

## 2023-02-13 DIAGNOSIS — Z1612 Extended spectrum beta lactamase (ESBL) resistance: Secondary | ICD-10-CM | POA: Diagnosis present

## 2023-02-13 DIAGNOSIS — G2581 Restless legs syndrome: Secondary | ICD-10-CM | POA: Diagnosis present

## 2023-02-13 DIAGNOSIS — F314 Bipolar disorder, current episode depressed, severe, without psychotic features: Secondary | ICD-10-CM | POA: Diagnosis present

## 2023-02-13 DIAGNOSIS — R6520 Severe sepsis without septic shock: Secondary | ICD-10-CM | POA: Diagnosis not present

## 2023-02-13 DIAGNOSIS — K219 Gastro-esophageal reflux disease without esophagitis: Secondary | ICD-10-CM | POA: Diagnosis present

## 2023-02-13 DIAGNOSIS — A419 Sepsis, unspecified organism: Principal | ICD-10-CM | POA: Diagnosis present

## 2023-02-13 DIAGNOSIS — N390 Urinary tract infection, site not specified: Secondary | ICD-10-CM | POA: Diagnosis present

## 2023-02-13 DIAGNOSIS — E66811 Obesity, class 1: Secondary | ICD-10-CM | POA: Diagnosis present

## 2023-02-13 DIAGNOSIS — G9340 Encephalopathy, unspecified: Secondary | ICD-10-CM

## 2023-02-13 DIAGNOSIS — Z7901 Long term (current) use of anticoagulants: Secondary | ICD-10-CM | POA: Diagnosis not present

## 2023-02-13 DIAGNOSIS — Z9851 Tubal ligation status: Secondary | ICD-10-CM

## 2023-02-13 DIAGNOSIS — D61818 Other pancytopenia: Secondary | ICD-10-CM | POA: Diagnosis present

## 2023-02-13 DIAGNOSIS — M21371 Foot drop, right foot: Secondary | ICD-10-CM | POA: Diagnosis present

## 2023-02-13 DIAGNOSIS — E1165 Type 2 diabetes mellitus with hyperglycemia: Secondary | ICD-10-CM | POA: Diagnosis present

## 2023-02-13 DIAGNOSIS — G9341 Metabolic encephalopathy: Principal | ICD-10-CM | POA: Diagnosis present

## 2023-02-13 DIAGNOSIS — Z833 Family history of diabetes mellitus: Secondary | ICD-10-CM

## 2023-02-13 DIAGNOSIS — Z85118 Personal history of other malignant neoplasm of bronchus and lung: Secondary | ICD-10-CM

## 2023-02-13 DIAGNOSIS — A4151 Sepsis due to Escherichia coli [E. coli]: Secondary | ICD-10-CM | POA: Diagnosis present

## 2023-02-13 DIAGNOSIS — Z882 Allergy status to sulfonamides status: Secondary | ICD-10-CM

## 2023-02-13 DIAGNOSIS — Z86718 Personal history of other venous thrombosis and embolism: Secondary | ICD-10-CM

## 2023-02-13 DIAGNOSIS — Z6833 Body mass index (BMI) 33.0-33.9, adult: Secondary | ICD-10-CM | POA: Diagnosis not present

## 2023-02-13 DIAGNOSIS — I959 Hypotension, unspecified: Secondary | ICD-10-CM | POA: Diagnosis present

## 2023-02-13 DIAGNOSIS — Z7984 Long term (current) use of oral hypoglycemic drugs: Secondary | ICD-10-CM

## 2023-02-13 DIAGNOSIS — M25512 Pain in left shoulder: Secondary | ICD-10-CM | POA: Diagnosis present

## 2023-02-13 DIAGNOSIS — Z1152 Encounter for screening for COVID-19: Secondary | ICD-10-CM

## 2023-02-13 DIAGNOSIS — Z23 Encounter for immunization: Secondary | ICD-10-CM

## 2023-02-13 DIAGNOSIS — E876 Hypokalemia: Secondary | ICD-10-CM | POA: Diagnosis present

## 2023-02-13 DIAGNOSIS — I82409 Acute embolism and thrombosis of unspecified deep veins of unspecified lower extremity: Secondary | ICD-10-CM | POA: Diagnosis present

## 2023-02-13 DIAGNOSIS — E872 Acidosis, unspecified: Secondary | ICD-10-CM | POA: Diagnosis present

## 2023-02-13 DIAGNOSIS — R6521 Severe sepsis with septic shock: Secondary | ICD-10-CM | POA: Diagnosis present

## 2023-02-13 DIAGNOSIS — I9589 Other hypotension: Secondary | ICD-10-CM | POA: Diagnosis not present

## 2023-02-13 DIAGNOSIS — Z7952 Long term (current) use of systemic steroids: Secondary | ICD-10-CM

## 2023-02-13 DIAGNOSIS — R319 Hematuria, unspecified: Secondary | ICD-10-CM | POA: Diagnosis not present

## 2023-02-13 DIAGNOSIS — Z79899 Other long term (current) drug therapy: Secondary | ICD-10-CM

## 2023-02-13 DIAGNOSIS — G4700 Insomnia, unspecified: Secondary | ICD-10-CM | POA: Diagnosis present

## 2023-02-13 DIAGNOSIS — Z902 Acquired absence of lung [part of]: Secondary | ICD-10-CM

## 2023-02-13 DIAGNOSIS — E0590 Thyrotoxicosis, unspecified without thyrotoxic crisis or storm: Secondary | ICD-10-CM | POA: Diagnosis present

## 2023-02-13 DIAGNOSIS — Z87891 Personal history of nicotine dependence: Secondary | ICD-10-CM

## 2023-02-13 LAB — PROTIME-INR
INR: 1.3 — ABNORMAL HIGH (ref 0.8–1.2)
Prothrombin Time: 16.4 s — ABNORMAL HIGH (ref 11.4–15.2)

## 2023-02-13 LAB — CBC WITH DIFFERENTIAL/PLATELET
Abs Immature Granulocytes: 0.05 10*3/uL (ref 0.00–0.07)
Basophils Absolute: 0 10*3/uL (ref 0.0–0.1)
Basophils Relative: 0 %
Eosinophils Absolute: 0 10*3/uL (ref 0.0–0.5)
Eosinophils Relative: 0 %
HCT: 47.8 % — ABNORMAL HIGH (ref 36.0–46.0)
Hemoglobin: 15.8 g/dL — ABNORMAL HIGH (ref 12.0–15.0)
Immature Granulocytes: 1 %
Lymphocytes Relative: 5 %
Lymphs Abs: 0.5 10*3/uL — ABNORMAL LOW (ref 0.7–4.0)
MCH: 30 pg (ref 26.0–34.0)
MCHC: 33.1 g/dL (ref 30.0–36.0)
MCV: 90.9 fL (ref 80.0–100.0)
Monocytes Absolute: 0.7 10*3/uL (ref 0.1–1.0)
Monocytes Relative: 7 %
Neutro Abs: 8.7 10*3/uL — ABNORMAL HIGH (ref 1.7–7.7)
Neutrophils Relative %: 87 %
Platelets: 145 10*3/uL — ABNORMAL LOW (ref 150–400)
RBC: 5.26 MIL/uL — ABNORMAL HIGH (ref 3.87–5.11)
RDW: 13.1 % (ref 11.5–15.5)
WBC: 9.9 10*3/uL (ref 4.0–10.5)
nRBC: 0 % (ref 0.0–0.2)

## 2023-02-13 LAB — URINALYSIS, W/ REFLEX TO CULTURE (INFECTION SUSPECTED)
Bilirubin Urine: NEGATIVE
Glucose, UA: >=500 mg/dL — AB
Ketones, ur: 20 mg/dL — AB
Nitrite: NEGATIVE
Protein, ur: 30 mg/dL — AB
Specific Gravity, Urine: 1.015 (ref 1.005–1.030)
WBC, UA: >50 WBC/hpf (ref 0–5)
pH: 6 (ref 5.0–8.0)

## 2023-02-13 LAB — COMPREHENSIVE METABOLIC PANEL
ALT: 30 U/L (ref 0–44)
AST: 23 U/L (ref 15–41)
Albumin: 3.6 g/dL (ref 3.5–5.0)
Alkaline Phosphatase: 99 U/L (ref 38–126)
Anion gap: 14 (ref 5–15)
BUN: 10 mg/dL (ref 8–23)
CO2: 24 mmol/L (ref 22–32)
Calcium: 9.3 mg/dL (ref 8.9–10.3)
Chloride: 96 mmol/L — ABNORMAL LOW (ref 98–111)
Creatinine, Ser: 0.59 mg/dL (ref 0.44–1.00)
GFR, Estimated: >60 mL/min (ref >60)
Glucose, Bld: 426 mg/dL — ABNORMAL HIGH (ref 70–99)
Potassium: 3.4 mmol/L — ABNORMAL LOW (ref 3.5–5.1)
Sodium: 134 mmol/L — ABNORMAL LOW (ref 135–145)
Total Bilirubin: 1.7 mg/dL — ABNORMAL HIGH (ref 0.3–1.2)
Total Protein: 7.2 g/dL (ref 6.5–8.1)

## 2023-02-13 LAB — RESP PANEL BY RT-PCR (RSV, FLU A&B, COVID)  RVPGX2
Influenza A by PCR: NEGATIVE
Influenza B by PCR: NEGATIVE
Resp Syncytial Virus by PCR: NEGATIVE
SARS Coronavirus 2 by RT PCR: NEGATIVE

## 2023-02-13 LAB — TROPONIN I (HIGH SENSITIVITY)
Troponin I (High Sensitivity): 4 ng/L (ref <18)
Troponin I (High Sensitivity): 7 ng/L (ref <18)

## 2023-02-13 LAB — LACTIC ACID, PLASMA
Lactic Acid, Venous: 2.7 mmol/L (ref 0.5–1.9)
Lactic Acid, Venous: 2 mmol/L (ref 0.5–1.9)

## 2023-02-13 LAB — LITHIUM LEVEL: Lithium Lvl: 0.08 mmol/L — ABNORMAL LOW (ref 0.60–1.20)

## 2023-02-13 LAB — LIPASE, BLOOD: Lipase: 19 U/L (ref 11–51)

## 2023-02-13 LAB — APTT: aPTT: 33 s (ref 24–36)

## 2023-02-13 MED ORDER — SODIUM CHLORIDE 0.9 % IV SOLN
2.0000 g | INTRAVENOUS | Status: DC
  Administered 2023-02-13: 2 g via INTRAVENOUS
  Filled 2023-02-13: qty 20

## 2023-02-13 MED ORDER — LACTATED RINGERS IV BOLUS
1000.0000 mL | Freq: Once | INTRAVENOUS | Status: AC
  Administered 2023-02-13: 1000 mL via INTRAVENOUS

## 2023-02-13 MED ORDER — SODIUM CHLORIDE 0.9 % IV BOLUS
2000.0000 mL | Freq: Once | INTRAVENOUS | Status: AC
  Administered 2023-02-13: 2000 mL via INTRAVENOUS

## 2023-02-13 MED ORDER — IBUPROFEN 400 MG PO TABS
ORAL_TABLET | ORAL | Status: AC
  Filled 2023-02-13: qty 1

## 2023-02-13 MED ORDER — OXYCODONE HCL 5 MG PO TABS
5.0000 mg | ORAL_TABLET | Freq: Once | ORAL | Status: AC
  Administered 2023-02-13: 5 mg via ORAL
  Filled 2023-02-13: qty 1

## 2023-02-13 MED ORDER — SODIUM CHLORIDE 0.9 % IV BOLUS
1000.0000 mL | Freq: Once | INTRAVENOUS | Status: AC
  Administered 2023-02-13: 1000 mL via INTRAVENOUS

## 2023-02-13 MED ORDER — ACETAMINOPHEN 325 MG PO TABS
650.0000 mg | ORAL_TABLET | Freq: Once | ORAL | Status: AC
  Administered 2023-02-13: 650 mg via ORAL
  Filled 2023-02-13: qty 2

## 2023-02-13 MED ORDER — IBUPROFEN 400 MG PO TABS
400.0000 mg | ORAL_TABLET | Freq: Once | ORAL | Status: AC
  Administered 2023-02-13: 400 mg via ORAL

## 2023-02-13 MED ORDER — OXYCODONE-ACETAMINOPHEN 5-325 MG PO TABS
1.0000 | ORAL_TABLET | Freq: Once | ORAL | Status: AC
  Administered 2023-02-13: 1 via ORAL
  Filled 2023-02-13: qty 1

## 2023-02-13 NOTE — Consult Note (Signed)
CODE SEPSIS - PHARMACY COMMUNICATION  **Broad-spectrum antimicrobials should be administered within one hour of sepsis diagnosis**  Time Code Sepsis call or page was received: 1443  Antibiotics ordered: Ceftriaxone  Time of first antibiotic administration: 1518  Additional action taken by pharmacy: N/A  If necessary, name of provider/nurse contacted: N/A    Will M. Dareen Piano, PharmD Clinical Pharmacist 02/13/2023 4:57 PM

## 2023-02-13 NOTE — ED Notes (Signed)
[facility] updated to pt's disposition

## 2023-02-13 NOTE — ED Notes (Signed)
Pt called out and needed to be changed. Pt has been changed and cleaned up, placed a new brief and purewick. Resting at this time with call light in reach.

## 2023-02-13 NOTE — ED Notes (Signed)
No changes. Alert, NAD, calm, interactive, intermittently calling out.

## 2023-02-13 NOTE — ED Notes (Signed)
Intermittent calling out, appears behavioral. Easily redirectable. NAD, calm, interactive, resps e/u, polite, apologetic, speaking clearly.

## 2023-02-13 NOTE — Sepsis Progress Note (Signed)
eLink is following this Code Sepsis. °

## 2023-02-13 NOTE — ED Provider Notes (Signed)
[place] EMERGENCY DEPARTMENT AT [facility] Provider Note   CSN: [id number] Arrival date & time: 02/13/23  1418     History {Add pertinent medical, surgical, social history, OB history to HPI:1} Chief Complaint  Patient presents with   Altered Mental Status    [name] is a 67 y.o. female.  She is brought in by EMS from home for decreased responsiveness.  Code sepsis was activated.  She said she started feeling bad the day before yesterday but cannot tell me specifically what is bothering her.  She does endorse cough productive of brown sputum, pain in her chest, pain in her abdomen, diarrhea.  She was not aware if she had a fever.  She does not know what day it is but she thinks it is October.  She has a history of lung cancer.  She was given Narcan x 2 by EMS.   The history is provided by the patient.  Altered Mental Status Progression:  Unchanged Chronicity:  New Associated symptoms: abdominal pain and fever        Home Medications Prior to Admission medications   Medication Sig Start Date End Date Taking? Authorizing Provider  acetaminophen (TYLENOL) 325 MG tablet Take 650 mg by mouth every 4 (four) hours as needed for mild pain.    [provider]  amoxicillin-clavulanate (AUGMENTIN) 875-125 MG tablet Take 1 tablet by mouth 2 (two) times daily. 10/16/22   [provider]  ascorbic acid (VITAMIN C) 500 MG tablet Take 500 mg by mouth 2 (two) times daily.    [provider]  busPIRone (BUSPAR) 5 MG tablet Take 5 mg by mouth 2 (two) times daily. 01/14/23   [provider]  cetirizine (ZYRTEC) 10 MG tablet Take 10 mg by mouth daily.    [provider]  ELIQUIS 5 MG TABS tablet Take 5 mg by mouth 2 (two) times daily. 01/18/23   [provider]  folic acid (FOLVITE) 1 MG tablet Take 1 mg by mouth daily.    [provider]  furosemide (LASIX) 20 MG tablet Take 20 mg by mouth daily. 08/24/22   [provider]  gabapentin (NEURONTIN) 100 MG capsule Take 300 mg by mouth 3 (three) times daily. 07/20/21   [provider]  hydrochlorothiazide (HYDRODIURIL) 25 MG tablet Take 1 tablet (25 mg total) by mouth daily. 08/27/22   Jacalyn Lefevre, MD  HYDROcodone-acetaminophen (NORCO) 10-325 MG tablet Take 1 tablet by mouth every 6 (six) hours.    [provider]  hydrOXYzine (ATARAX) 25 MG tablet Take 1 tablet (25 mg total) by mouth every 6 (six) hours as needed for itching. 08/27/22   Jacalyn Lefevre, MD  ipratropium-albuterol (DUONEB) 0.5-2.5 (3) MG/3ML SOLN Take by nebulization. 12/04/22   [provider]  Ketoconazole 2 % FOAM Apply topically. 01/12/23   [provider]  lactulose (CHRONULAC) 10 GM/15ML solution Take 30 mLs by mouth daily as needed for moderate constipation. 01/18/23   [provider]  LAGEVRIO 200 MG CAPS capsule Take 4 capsules by mouth 2 (two) times daily. 01/09/23   [provider]  lidocaine (LIDODERM) 5 % Place 1 patch onto the skin daily. Remove & Discard patch within 12 hours or as directed by MD; Apply to LOWER LEG    [provider]  lithium 300 MG tablet Take 300-600 mg by mouth See admin instructions. 300mg  in the am (0800), 600mg  in th evening (1800) 07/20/21   [provider]  lithium 600 MG capsule Take 600 mg by mouth daily. 01/15/23   [provider]  lithium carbonate 300 MG capsule Take 300 mg by mouth 2 (two) times daily. 11/24/22   [provider]  LORazepam (ATIVAN) 2 MG/ML injection Inject 2 mg into the vein every 4 (four) hours as needed for anxiety, seizure or sedation. 12/20/22   [provider]  Melatonin 10 MG TABS Take 10 mg by mouth at bedtime.    [provider]  metFORMIN (GLUCOPHAGE) 500 MG tablet Take 1 pill a day 12/17/22   Bethann Berkshire, MD  midodrine (PROAMATINE) 10 MG tablet Take 1 tablet (10 mg total) by mouth 3 (three) times daily with meals. 08/03/21    Catarina Hartshorn, MD  Multiple Vitamin (MULTIVITAMIN ADULT PO) Take 1 tablet by mouth daily.    [provider]  nitrofurantoin, macrocrystal-monohydrate, (MACROBID) 100 MG capsule Take 100 mg by mouth 2 (two) times daily. 01/05/23   [provider]  Nutritional Supplements (NUTRITIONAL SUPPLEMENT PO) Take 120 mLs by mouth in the morning, at noon, and at bedtime. Wound Healing    [provider]  OLANZapine (ZYPREXA) 5 MG tablet Take 5 mg by mouth at bedtime. 01/11/23   [provider]  ondansetron (ZOFRAN) 4 MG tablet Take 4 mg by mouth every 8 (eight) hours as needed. 12/14/22   [provider]  oxyCODONE-acetaminophen (PERCOCET) 10-325 MG tablet Take 1 tablet by mouth every 4 (four) hours as needed. 01/23/23   [provider]  potassium chloride (KLOR-CON) 10 MEQ tablet Take 1 tablet (10 mEq total) by mouth daily. 12/17/22   Bethann Berkshire, MD  potassium chloride 20 MEQ/15ML (10%) SOLN Take 10 mEq by mouth daily. 12/20/22   [provider]  Potassium Chloride ER 20 MEQ TBCR Take 1 tablet by mouth daily. 09/06/22   [provider]  predniSONE (DELTASONE) 50 MG tablet Take 1 tablet (50 mg total) by mouth daily with breakfast. 08/27/22   Jacalyn Lefevre, MD  pyrithione zinc (SELSUN BLUE ITCHY DRY SCALP) 1 % shampoo Apply topically daily as needed for itching. 08/27/22   Jacalyn Lefevre, MD  QUEtiapine (SEROQUEL) 100 MG tablet Take 100 mg by mouth at bedtime. 07/20/21   [provider]  QUEtiapine (SEROQUEL) 25 MG tablet Take 25 mg by mouth every morning.    [provider]  RELISTOR 12 MG/0.6ML SOLN injection Inject 8 mg into the skin every other day. 01/04/23   [provider]  saccharomyces boulardii (FLORASTOR) 250 MG capsule Take 250 mg by mouth daily.    [provider]  tamsulosin (FLOMAX) 0.4 MG CAPS capsule Take 0.4 mg by mouth daily.    [provider]  triamcinolone cream (KENALOG) 0.5 % Apply 1  Application topically 2 (two) times daily. 10/07/22   [provider]  zolpidem (AMBIEN) 5 MG tablet Take 5 mg by mouth at bedtime as needed. 01/15/23   [provider]      Allergies    Sulfa antibiotics    Review of Systems   Review of Systems  Unable to perform ROS: Mental status change  Constitutional:  Positive for fever.  Respiratory:  Positive for cough and shortness of breath.   Cardiovascular:  Positive for chest pain.  Gastrointestinal:  Positive for abdominal pain.    Physical Exam Updated Vital Signs BP 104/82 (BP Location: Right Arm)   Pulse (!) 142   Temp (!) 102.7 F (39.3 C) (Axillary)   Resp (!) 22  Wt 96.2 kg   SpO2 93%   BMI 31.31 kg/m  Physical Exam Vitals and nursing note reviewed.  Constitutional:      General: She is not in acute distress.    Appearance: She is well-developed. She is obese.  HENT:     Head: Normocephalic and atraumatic.  Eyes:     Conjunctiva/sclera: Conjunctivae normal.  Cardiovascular:     Rate and Rhythm: Regular rhythm. Tachycardia present.     Heart sounds: No murmur heard. Pulmonary:     Effort: Pulmonary effort is normal. No respiratory distress.     Breath sounds: Rhonchi present.  Abdominal:     Palpations: Abdomen is soft.     Tenderness: There is no abdominal tenderness. There is no guarding or rebound.  Musculoskeletal:        General: No deformity.     Cervical back: Neck supple.     Right lower leg: No edema.     Left lower leg: No edema.  Skin:    General: Skin is warm and dry.     Capillary Refill: Capillary refill takes less than 2 seconds.  Neurological:     General: No focal deficit present.     Mental Status: She is disoriented.     Motor: No weakness.     ED Results / Procedures / Treatments   Labs (all labs ordered are listed, but only abnormal results are displayed) Labs Reviewed  RESP PANEL BY RT-PCR (RSV, FLU A&B, COVID)  [id number]  CULTURE, BLOOD (ROUTINE X 2)  CULTURE,  BLOOD (ROUTINE X 2)  LACTIC ACID, PLASMA  LACTIC ACID, PLASMA  COMPREHENSIVE METABOLIC PANEL  CBC WITH DIFFERENTIAL/PLATELET  PROTIME-INR  APTT  URINALYSIS, W/ REFLEX TO CULTURE (INFECTION SUSPECTED)  LITHIUM LEVEL    EKG None  Radiology No results found.  Procedures Procedures  {Document cardiac monitor, telemetry assessment procedure when appropriate:1}  Medications Ordered in ED Medications  cefTRIAXone (ROCEPHIN) 2 g in sodium chloride 0.9 % 100 mL IVPB (has no administration in time range)  sodium chloride 0.9 % bolus 2,000 mL (has no administration in time range)  acetaminophen (TYLENOL) tablet 650 mg (has no administration in time range)    ED Course/ Medical Decision Making/ A&P Clinical Course as of 02/13/23 1502  Tue Feb 13, 2023  1501 Chest x-ray interpreted by me as increased haziness on left side awaiting radiology reading [MB]    Clinical Course User Index [MB] Terrilee Files, MD   {   Click here for ABCD2, HEART and other calculatorsREFRESH Note before signing :1}                              Medical Decision Making Amount and/or Complexity of Data Reviewed Labs: ordered.  Risk OTC drugs.   This patient complains of ***; this involves an extensive number of treatment Options and is a complaint that carries with it a high risk of complications and morbidity. The differential includes ***  I ordered, reviewed and interpreted labs, which included *** I ordered medication *** and reviewed PMP when indicated. I ordered imaging studies which included *** and I independently    visualized and interpreted imaging which showed *** Additional history obtained from *** Previous records obtained and reviewed *** I consulted *** and discussed lab and imaging findings and discussed disposition.  Cardiac monitoring reviewed, *** Social determinants considered, *** Critical Interventions: ***  After the interventions stated above, I  reevaluated the  patient and found *** Admission and further testing considered, ***   {Document critical care time when appropriate:1} {Document review of labs and clinical decision tools ie heart score, Chads2Vasc2 etc:1}  {Document your independent review of radiology images, and any outside records:1} {Document your discussion with family members, caretakers, and with consultants:1} {Document social determinants of health affecting pt's care:1} {Document your decision making why or why not admission, treatments were needed:1} Final Clinical Impression(s) / ED Diagnoses Final diagnoses:  None    Rx / DC Orders ED Discharge Orders     None

## 2023-02-13 NOTE — ED Notes (Addendum)
Xray at [facility]. Lab at [facility]

## 2023-02-13 NOTE — ED Notes (Signed)
Family x2 at [facility]

## 2023-02-13 NOTE — ED Triage Notes (Addendum)
BIB [facility] from [facility] for unresponsiveness, narcan  x2 given by facility, code sepsis called by EMS. EMS reports tachy 145, low BP sBP 80s, febrile 103.1 axillary. No IV. BS 369. Reports smell of UTI. Pt alert, anxious, fidgety, restless, resisting some care and assessment by EMS. Arrives alert, NAD, restless, agitated, calling out, clammy, diaphoretic, 102.7 Axillary on arrival, follows some commands. LS decreased. Airway patent/ yelling. H/o schizophrenia.

## 2023-02-14 ENCOUNTER — Inpatient Hospital Stay (HOSPITAL_COMMUNITY): Payer: Medicare Other

## 2023-02-14 DIAGNOSIS — R6520 Severe sepsis without septic shock: Secondary | ICD-10-CM

## 2023-02-14 DIAGNOSIS — E1165 Type 2 diabetes mellitus with hyperglycemia: Secondary | ICD-10-CM | POA: Diagnosis present

## 2023-02-14 DIAGNOSIS — G9341 Metabolic encephalopathy: Secondary | ICD-10-CM | POA: Diagnosis not present

## 2023-02-14 DIAGNOSIS — I9589 Other hypotension: Secondary | ICD-10-CM

## 2023-02-14 DIAGNOSIS — F314 Bipolar disorder, current episode depressed, severe, without psychotic features: Secondary | ICD-10-CM

## 2023-02-14 DIAGNOSIS — A419 Sepsis, unspecified organism: Secondary | ICD-10-CM | POA: Diagnosis not present

## 2023-02-14 DIAGNOSIS — N390 Urinary tract infection, site not specified: Secondary | ICD-10-CM | POA: Diagnosis not present

## 2023-02-14 DIAGNOSIS — R319 Hematuria, unspecified: Secondary | ICD-10-CM

## 2023-02-14 DIAGNOSIS — R6521 Severe sepsis with septic shock: Secondary | ICD-10-CM | POA: Diagnosis present

## 2023-02-14 DIAGNOSIS — E876 Hypokalemia: Secondary | ICD-10-CM

## 2023-02-14 LAB — BLOOD CULTURE ID PANEL (REFLEXED) - BCID2

## 2023-02-14 LAB — COMPREHENSIVE METABOLIC PANEL
ALT: 22 U/L (ref 0–44)
AST: 15 U/L (ref 15–41)
Albumin: 2.8 g/dL — ABNORMAL LOW (ref 3.5–5.0)
Alkaline Phosphatase: 72 U/L (ref 38–126)
Anion gap: 10 (ref 5–15)
BUN: 9 mg/dL (ref 8–23)
CO2: 24 mmol/L (ref 22–32)
Calcium: 8.5 mg/dL — ABNORMAL LOW (ref 8.9–10.3)
Chloride: 104 mmol/L (ref 98–111)
Creatinine, Ser: 0.46 mg/dL (ref 0.44–1.00)
GFR, Estimated: >60 mL/min (ref >60)
Glucose, Bld: 221 mg/dL — ABNORMAL HIGH (ref 70–99)
Potassium: 3 mmol/L — ABNORMAL LOW (ref 3.5–5.1)
Sodium: 138 mmol/L (ref 135–145)
Total Bilirubin: 0.7 mg/dL (ref 0.3–1.2)
Total Protein: 5.8 g/dL — ABNORMAL LOW (ref 6.5–8.1)

## 2023-02-14 LAB — GLUCOSE, CAPILLARY
Glucose-Capillary: 231 mg/dL — ABNORMAL HIGH (ref 70–99)
Glucose-Capillary: 232 mg/dL — ABNORMAL HIGH (ref 70–99)
Glucose-Capillary: 213 mg/dL — ABNORMAL HIGH (ref 70–99)
Glucose-Capillary: 249 mg/dL — ABNORMAL HIGH (ref 70–99)
Glucose-Capillary: 241 mg/dL — ABNORMAL HIGH (ref 70–99)
Glucose-Capillary: 269 mg/dL — ABNORMAL HIGH (ref 70–99)

## 2023-02-14 LAB — CBC WITH DIFFERENTIAL/PLATELET
Abs Immature Granulocytes: 0.07 10*3/uL (ref 0.00–0.07)
Basophils Absolute: 0 10*3/uL (ref 0.0–0.1)
Basophils Relative: 0 %
Eosinophils Absolute: 0 10*3/uL (ref 0.0–0.5)
Eosinophils Relative: 0 %
HCT: 40 % (ref 36.0–46.0)
Hemoglobin: 12.8 g/dL (ref 12.0–15.0)
Immature Granulocytes: 1 %
Lymphocytes Relative: 7 %
Lymphs Abs: 0.9 10*3/uL (ref 0.7–4.0)
MCH: 29.6 pg (ref 26.0–34.0)
MCHC: 32 g/dL (ref 30.0–36.0)
MCV: 92.6 fL (ref 80.0–100.0)
Monocytes Absolute: 1 10*3/uL (ref 0.1–1.0)
Monocytes Relative: 8 %
Neutro Abs: 10 10*3/uL — ABNORMAL HIGH (ref 1.7–7.7)
Neutrophils Relative %: 84 %
Platelets: 117 10*3/uL — ABNORMAL LOW (ref 150–400)
RBC: 4.32 MIL/uL (ref 3.87–5.11)
RDW: 13.3 % (ref 11.5–15.5)
WBC: 12 10*3/uL — ABNORMAL HIGH (ref 4.0–10.5)
nRBC: 0 % (ref 0.0–0.2)

## 2023-02-14 LAB — HEMOGLOBIN A1C
Hgb A1c MFr Bld: 10.9 % — ABNORMAL HIGH (ref 4.8–5.6)
Mean Plasma Glucose: 266.13 mg/dL

## 2023-02-14 LAB — T4, FREE: Free T4: 1.43 ng/dL — ABNORMAL HIGH (ref 0.61–1.12)

## 2023-02-14 LAB — MRSA NEXT GEN BY PCR, NASAL: MRSA by PCR Next Gen: DETECTED — AB

## 2023-02-14 LAB — PHOSPHORUS: Phosphorus: 1.5 mg/dL — ABNORMAL LOW (ref 2.5–4.6)

## 2023-02-14 LAB — AMMONIA: Ammonia: 25 umol/L (ref 9–35)

## 2023-02-14 LAB — LACTIC ACID, PLASMA
Lactic Acid, Venous: 1.3 mmol/L (ref 0.5–1.9)
Lactic Acid, Venous: 1.1 mmol/L (ref 0.5–1.9)

## 2023-02-14 LAB — TSH: TSH: 0.102 u[IU]/mL — ABNORMAL LOW (ref 0.350–4.500)

## 2023-02-14 LAB — CBG MONITORING, ED: Glucose-Capillary: 323 mg/dL — ABNORMAL HIGH (ref 70–99)

## 2023-02-14 LAB — HIV ANTIBODY (ROUTINE TESTING W REFLEX): HIV Screen 4th Generation wRfx: NONREACTIVE

## 2023-02-14 LAB — MAGNESIUM: Magnesium: 1.6 mg/dL — ABNORMAL LOW (ref 1.7–2.4)

## 2023-02-14 MED ORDER — SODIUM CHLORIDE 0.9 % IV SOLN
2.0000 g | INTRAVENOUS | Status: DC
  Administered 2023-02-14: 2 g via INTRAVENOUS
  Filled 2023-02-14: qty 20

## 2023-02-14 MED ORDER — K PHOS MONO-SOD PHOS DI & MONO 155-852-130 MG PO TABS: 500.0000 mg | ORAL_TABLET | Freq: Four times a day (QID) | ORAL | Status: DC

## 2023-02-14 MED ORDER — LORAZEPAM 1 MG PO TABS
1.0000 mg | ORAL_TABLET | Freq: Every day | ORAL | Status: DC
  Administered 2023-02-14 (×2): 1 mg via ORAL
  Filled 2023-02-14 (×3): qty 1

## 2023-02-14 MED ORDER — ONDANSETRON HCL 4 MG/2ML IJ SOLN
4.0000 mg | Freq: Four times a day (QID) | INTRAMUSCULAR | Status: DC | PRN
  Administered 2023-02-14: 4 mg via INTRAVENOUS
  Filled 2023-02-14: qty 2

## 2023-02-14 MED ORDER — ONDANSETRON HCL 4 MG PO TABS: 4.0000 mg | ORAL_TABLET | Freq: Four times a day (QID) | ORAL | Status: DC | PRN

## 2023-02-14 MED ORDER — MAGNESIUM SULFATE 2 GM/50ML IV SOLN
2.0000 g | Freq: Once | INTRAVENOUS | Status: AC
  Administered 2023-02-14: 2 g via INTRAVENOUS
  Filled 2023-02-14: qty 50

## 2023-02-14 MED ORDER — SODIUM PHOSPHATES 45 MMOLE/15ML IV SOLN
30.0000 mmol | Freq: Once | INTRAVENOUS | Status: AC
  Administered 2023-02-14: 30 mmol via INTRAVENOUS
  Filled 2023-02-14: qty 10

## 2023-02-14 MED ORDER — LITHIUM CARBONATE 150 MG PO CAPS
600.0000 mg | ORAL_CAPSULE | Freq: Every day | ORAL | Status: DC
  Administered 2023-02-14 – 2023-02-16 (×3): 600 mg via ORAL
  Filled 2023-02-14: qty 2
  Filled 2023-02-14 (×2): qty 4
  Filled 2023-02-14 (×2): qty 2
  Filled 2023-02-14: qty 4

## 2023-02-14 MED ORDER — LACTULOSE 10 GM/15ML PO SOLN: 20.0000 g | Freq: Every day | ORAL | Status: DC | PRN

## 2023-02-14 MED ORDER — GABAPENTIN 300 MG PO CAPS
300.0000 mg | ORAL_CAPSULE | Freq: Three times a day (TID) | ORAL | Status: DC
  Administered 2023-02-14 – 2023-02-16 (×7): 300 mg via ORAL
  Filled 2023-02-14 (×7): qty 1

## 2023-02-14 MED ORDER — SODIUM CHLORIDE 0.9 % IV SOLN: 250.0000 mL | INTRAVENOUS | Status: DC

## 2023-02-14 MED ORDER — POTASSIUM CHLORIDE CRYS ER 20 MEQ PO TBCR
40.0000 meq | EXTENDED_RELEASE_TABLET | Freq: Once | ORAL | Status: AC
  Administered 2023-02-14: 40 meq via ORAL
  Filled 2023-02-14: qty 2

## 2023-02-14 MED ORDER — MUPIROCIN 2 % EX OINT
TOPICAL_OINTMENT | Freq: Two times a day (BID) | CUTANEOUS | Status: DC
  Administered 2023-02-16: 1 via NASAL
  Filled 2023-02-14: qty 22

## 2023-02-14 MED ORDER — MIDODRINE HCL 5 MG PO TABS
10.0000 mg | ORAL_TABLET | Freq: Three times a day (TID) | ORAL | Status: DC
  Administered 2023-02-14 (×3): 10 mg via ORAL
  Filled 2023-02-14 (×3): qty 2

## 2023-02-14 MED ORDER — MORPHINE SULFATE (PF) 2 MG/ML IV SOLN
2.0000 mg | INTRAVENOUS | Status: DC | PRN
  Administered 2023-02-14 – 2023-02-16 (×5): 2 mg via INTRAVENOUS
  Filled 2023-02-14 (×5): qty 1

## 2023-02-14 MED ORDER — INSULIN ASPART 100 UNIT/ML IJ SOLN
0.0000 [IU] | INTRAMUSCULAR | Status: DC
  Administered 2023-02-14 (×3): 5 [IU] via SUBCUTANEOUS
  Administered 2023-02-14: 11 [IU] via SUBCUTANEOUS
  Administered 2023-02-14 – 2023-02-15 (×2): 8 [IU] via SUBCUTANEOUS
  Administered 2023-02-15 (×2): 3 [IU] via SUBCUTANEOUS
  Administered 2023-02-15: 5 [IU] via SUBCUTANEOUS
  Administered 2023-02-15: 3 [IU] via SUBCUTANEOUS
  Administered 2023-02-15 – 2023-02-16 (×2): 5 [IU] via SUBCUTANEOUS
  Administered 2023-02-16 (×3): 3 [IU] via SUBCUTANEOUS
  Filled 2023-02-14: qty 1

## 2023-02-14 MED ORDER — APIXABAN 5 MG PO TABS
5.0000 mg | ORAL_TABLET | Freq: Two times a day (BID) | ORAL | Status: DC
  Administered 2023-02-14 – 2023-02-16 (×6): 5 mg via ORAL
  Filled 2023-02-14 (×6): qty 1

## 2023-02-14 MED ORDER — METHIMAZOLE 5 MG PO TABS
15.0000 mg | ORAL_TABLET | Freq: Every day | ORAL | Status: DC
  Administered 2023-02-14 – 2023-02-16 (×3): 15 mg via ORAL
  Filled 2023-02-14 (×3): qty 3

## 2023-02-14 MED ORDER — SODIUM CHLORIDE 0.9 % IV SOLN
1.0000 g | Freq: Three times a day (TID) | INTRAVENOUS | Status: DC
  Administered 2023-02-14 – 2023-02-16 (×7): 1 g via INTRAVENOUS
  Filled 2023-02-14 (×7): qty 20

## 2023-02-14 MED ORDER — CHLORHEXIDINE GLUCONATE CLOTH 2 % EX PADS
6.0000 | MEDICATED_PAD | Freq: Every day | CUTANEOUS | Status: DC
  Administered 2023-02-14 – 2023-02-16 (×3): 6 via TOPICAL

## 2023-02-14 MED ORDER — MIDODRINE HCL 5 MG PO TABS: 10.0000 mg | ORAL_TABLET | Freq: Three times a day (TID) | ORAL | Status: DC

## 2023-02-14 MED ORDER — ROPINIROLE HCL 0.25 MG PO TABS
0.5000 mg | ORAL_TABLET | Freq: Every day | ORAL | Status: DC
  Administered 2023-02-14 – 2023-02-15 (×2): 0.5 mg via ORAL
  Filled 2023-02-14 (×2): qty 2

## 2023-02-14 MED ORDER — INFLUENZA VAC A&B SURF ANT ADJ 0.5 ML IM SUSY
0.5000 mL | PREFILLED_SYRINGE | INTRAMUSCULAR | Status: AC
  Administered 2023-02-15: 0.5 mL via INTRAMUSCULAR
  Filled 2023-02-14: qty 0.5

## 2023-02-14 MED ORDER — ACETAMINOPHEN 325 MG PO TABS
650.0000 mg | ORAL_TABLET | Freq: Four times a day (QID) | ORAL | Status: DC | PRN
  Administered 2023-02-14 – 2023-02-16 (×4): 650 mg via ORAL
  Filled 2023-02-14 (×4): qty 2

## 2023-02-14 MED ORDER — HYDROXYZINE HCL 25 MG PO TABS: 25.0000 mg | ORAL_TABLET | Freq: Four times a day (QID) | ORAL | Status: DC | PRN

## 2023-02-14 MED ORDER — MIDODRINE HCL 5 MG PO TABS: 5.0000 mg | ORAL_TABLET | Freq: Three times a day (TID) | ORAL | Status: DC | PRN

## 2023-02-14 MED ORDER — BUSPIRONE HCL 5 MG PO TABS
5.0000 mg | ORAL_TABLET | Freq: Two times a day (BID) | ORAL | Status: DC
  Administered 2023-02-14 – 2023-02-16 (×5): 5 mg via ORAL
  Filled 2023-02-14 (×5): qty 1

## 2023-02-14 MED ORDER — TRAZODONE HCL 50 MG PO TABS
50.0000 mg | ORAL_TABLET | Freq: Every day | ORAL | Status: DC
  Administered 2023-02-14: 50 mg via ORAL
  Filled 2023-02-14 (×2): qty 1

## 2023-02-14 MED ORDER — POTASSIUM CHLORIDE 20 MEQ PO PACK
40.0000 meq | PACK | Freq: Once | ORAL | Status: AC
  Administered 2023-02-14: 40 meq via ORAL
  Filled 2023-02-14: qty 2

## 2023-02-14 MED ORDER — SODIUM CHLORIDE 0.9 % IV SOLN: INTRAVENOUS | Status: DC

## 2023-02-14 MED ORDER — ACETAMINOPHEN 650 MG RE SUPP: 650.0000 mg | Freq: Four times a day (QID) | RECTAL | Status: DC | PRN

## 2023-02-14 MED ORDER — OLANZAPINE 5 MG PO TABS
10.0000 mg | ORAL_TABLET | Freq: Every day | ORAL | Status: DC
  Administered 2023-02-14 – 2023-02-15 (×3): 10 mg via ORAL
  Filled 2023-02-14 (×3): qty 2

## 2023-02-14 MED ORDER — NOREPINEPHRINE 4 MG/250ML-% IV SOLN
2.0000 ug/min | INTRAVENOUS | Status: DC
  Filled 2023-02-14: qty 250

## 2023-02-14 MED ORDER — OXYCODONE HCL 5 MG PO TABS
5.0000 mg | ORAL_TABLET | ORAL | Status: DC | PRN
  Administered 2023-02-14 – 2023-02-16 (×12): 5 mg via ORAL
  Filled 2023-02-14 (×12): qty 1

## 2023-02-14 NOTE — ED Notes (Signed)
ED TO INPATIENT HANDOFF REPORT  ED Nurse Name and Phone #: Jacques Earthly Name/Age/Gender [name] 67 y.o. female Room/Bed: APA03/APA03  Code Status   Code Status: Full Code  Home/SNF/Other [facility] Patient oriented to: self, place, time, and situation Is this baseline? Yes   Triage Complete: Triage complete  Chief Complaint Acute metabolic encephalopathy [G93.41] Septic shock (HCC) [A41.9, R65.21] Sepsis (HCC) [A41.9]  Triage Note BIB [facility] from [facility] for unresponsiveness, narcan  x2 given by facility, code sepsis called by EMS. EMS reports tachy 145, low BP sBP 80s, febrile 103.1 axillary. No IV. BS 369. Reports smell of UTI. Pt alert, anxious, fidgety, restless, resisting some care and assessment by EMS. Arrives alert, NAD, restless, agitated, calling out, clammy, diaphoretic, 102.7 Axillary on arrival, follows some commands. LS decreased. Airway patent/ yelling. H/o schizophrenia.    Allergies Allergies  Allergen Reactions   Sulfa Antibiotics Swelling and Rash    Neck swelling    Level of Care/Admitting Diagnosis ED Disposition     ED Disposition  Admit   Condition  --   Comment  Hospital Area: [facility] [[id number]]  Level of Care: Stepdown [14]  Covid Evaluation: Asymptomatic - no recent exposure (last 10 days) testing not required  Diagnosis: Sepsis [facility]) [[id number]]  Admitting Physician: Lilyan Gilford [[id number]]  Attending Physician: Lilyan Gilford [[id number]]  Certification:: I certify this patient will need inpatient services for at least 2 midnights          B Medical/Surgery History Past Medical History:  Diagnosis Date   Allergy    Bipolar disorder (HCC)    Depression    Drug abuse (HCC)    history of, went to rehab 2015   GERD (gastroesophageal reflux disease)    Lung cancer (HCC)    lung ca dx 11/11- right upper lobe   Past Surgical History:  Procedure Laterality Date   ABDOMINAL AORTOGRAM W/LOWER  EXTREMITY Bilateral 01/10/2022   Procedure: ABDOMINAL AORTOGRAM W/LOWER EXTREMITY;  Surgeon: Nada Libman, MD;  Location: [facility];  Service: Cardiovascular;  Laterality: Bilateral;   ANKLE FRACTURE SURGERY Left    HARDWARE REMOVAL Left 12/13/2021   Procedure: LEFT ANKLE HARDWARE REMOVAL;  Surgeon: Marcene Corning, MD;  Location: [facility];  Service: Orthopedics;  Laterality: Left;   LUNG LOBECTOMY Right 2011   RUL removed for lung cancer   STYLOID PROCESS EXCISION Left 10/16/2014   Procedure: EXCISION LEFT STYLOID PROCESS;  Surgeon: Drema Halon, MD;  Location: [facility];  Service: ENT;  Laterality: Left;   TONSILLECTOMY Bilateral 10/16/2014   Procedure: BILATERAL TONSILLECTOMY;  Surgeon: Drema Halon, MD;  Location: [facility];  Service: ENT;  Laterality: Bilateral;   TUBAL LIGATION  1984     A IV Location/Drains/Wounds Patient Lines/Drains/Airways Status     Active Line/Drains/Airways     Name Placement date Placement time Site Days   Peripheral IV 02/13/23 18 G 1" Right Antecubital 02/13/23  1500  Antecubital  1   External Urinary Catheter 02/13/23  2112  --  1            Intake/Output Last 24 hours  Intake/Output Summary (Last 24 hours) at 02/14/2023 0307 Last data filed at 02/14/2023 0305 Gross per 24 hour  Intake 3100 ml  Output 600 ml  Net 2500 ml    Labs/Imaging Results for orders placed or performed during the hospital encounter of 02/13/23 (from the past 48 hour(s))  Resp panel by RT-PCR (RSV, Flu A&B, Covid) Anterior Nasal Swab     Status: None   Collection Time: 02/13/23  2:56 PM   Specimen: Anterior Nasal Swab  Result Value Ref Range   SARS Coronavirus 2 by RT PCR NEGATIVE NEGATIVE    Comment: (NOTE) SARS-CoV-2 target nucleic acids are NOT DETECTED.  The SARS-CoV-2 RNA is generally detectable in upper respiratory specimens during the acute phase of infection. The lowest concentration of SARS-CoV-2 viral  copies this assay can detect is 138 copies/mL. A negative result does not preclude SARS-Cov-2 infection and should not be used as the sole basis for treatment or other patient management decisions. A negative result may occur with  improper specimen collection/handling, submission of specimen other than nasopharyngeal swab, presence of viral mutation(s) within the areas targeted by this assay, and inadequate number of viral copies(<138 copies/mL). A negative result must be combined with clinical observations, patient history, and epidemiological information. The expected result is Negative.  Fact Sheet for Patients:  BloggerCourse.com  Fact Sheet for Healthcare Providers:  SeriousBroker.it  This test is no t yet approved or cleared by the [place] FDA and  has been authorized for detection and/or diagnosis of SARS-CoV-2 by FDA under an Emergency Use Authorization (EUA). This EUA will remain  in effect (meaning this test can be used) for the duration of the COVID-19 declaration under Section 564(b)(1) of the Act, 21 U.S.C.section 360bbb-3(b)(1), unless the authorization is terminated  or revoked sooner.       Influenza A by PCR NEGATIVE NEGATIVE   Influenza B by PCR NEGATIVE NEGATIVE    Comment: (NOTE) The Xpert Xpress SARS-CoV-2/FLU/RSV plus assay is intended as an aid in the diagnosis of influenza from Nasopharyngeal swab specimens and should not be used as a sole basis for treatment. Nasal washings and aspirates are unacceptable for Xpert Xpress SARS-CoV-2/FLU/RSV testing.  Fact Sheet for Patients: BloggerCourse.com  Fact Sheet for Healthcare Providers: SeriousBroker.it  This test is not yet approved or cleared by the [place] FDA and has been authorized for detection and/or diagnosis of SARS-CoV-2 by FDA under an Emergency Use Authorization (EUA). This EUA will  remain in effect (meaning this test can be used) for the duration of the COVID-19 declaration under Section 564(b)(1) of the Act, 21 U.S.C. section 360bbb-3(b)(1), unless the authorization is terminated or revoked.     Resp Syncytial Virus by PCR NEGATIVE NEGATIVE    Comment: (NOTE) Fact Sheet for Patients: BloggerCourse.com  Fact Sheet for Healthcare Providers: SeriousBroker.it  This test is not yet approved or cleared by the [place] FDA and has been authorized for detection and/or diagnosis of SARS-CoV-2 by FDA under an Emergency Use Authorization (EUA). This EUA will remain in effect (meaning this test can be used) for the duration of the COVID-19 declaration under Section 564(b)(1) of the Act, 21 U.S.C. section 360bbb-3(b)(1), unless the authorization is terminated or revoked.  Performed at [facility], [address], [place], [place] [address]   Lactic acid, plasma     Status: Abnormal   Collection Time: 02/13/23  3:04 PM  Result Value Ref Range   Lactic Acid, Venous 2.7 (HH) 0.5 - 1.9 mmol/L    Comment: CRITICAL RESULT CALLED TO, READ BACK BY AND VERIFIED WITH COOK,J AT 1543 ON 02/13/23 BY PURDIE,J Performed at [facility], [address], [place], [place] [address]   Comprehensive metabolic panel     Status: Abnormal   Collection Time: 02/13/23  3:04 PM  Result  Value Ref Range   Sodium 134 (L) 135 - 145 mmol/L   Potassium 3.4 (L) 3.5 - 5.1 mmol/L   Chloride 96 (L) 98 - 111 mmol/L   CO2 24 22 - 32 mmol/L   Glucose, Bld 426 (H) 70 - 99 mg/dL    Comment: Glucose reference range applies only to samples taken after fasting for at least 8 hours.   BUN 10 8 - 23 mg/dL   Creatinine, Ser 1.61 0.44 - 1.00 mg/dL   Calcium 9.3 8.9 - 09.6 mg/dL   Total Protein 7.2 6.5 - 8.1 g/dL   Albumin 3.6 3.5 - 5.0 g/dL   AST 23 15 - 41 U/L   ALT 30 0 - 44 U/L   Alkaline Phosphatase 99 38 - 126 U/L   Total Bilirubin 1.7 (H) 0.3 -  1.2 mg/dL   GFR, Estimated >04 >54 mL/min    Comment: (NOTE) Calculated using the CKD-EPI Creatinine Equation (2021)    Anion gap 14 5 - 15    Comment: Performed at [facility], [address], [place], [place] [address]  CBC with Differential     Status: Abnormal   Collection Time: 02/13/23  3:04 PM  Result Value Ref Range   WBC 9.9 4.0 - 10.5 K/uL   RBC 5.26 (H) 3.87 - 5.11 MIL/uL   Hemoglobin 15.8 (H) 12.0 - 15.0 g/dL   HCT 91.4 (H) 78.2 - 95.6 %   MCV 90.9 80.0 - 100.0 fL   MCH 30.0 26.0 - 34.0 pg   MCHC 33.1 30.0 - 36.0 g/dL   RDW 21.3 08.6 - 57.8 %   Platelets 145 (L) 150 - 400 K/uL   nRBC 0.0 0.0 - 0.2 %   Neutrophils Relative % 87 %   Neutro Abs 8.7 (H) 1.7 - 7.7 K/uL   Lymphocytes Relative 5 %   Lymphs Abs 0.5 (L) 0.7 - 4.0 K/uL   Monocytes Relative 7 %   Monocytes Absolute 0.7 0.1 - 1.0 K/uL   Eosinophils Relative 0 %   Eosinophils Absolute 0.0 0.0 - 0.5 K/uL   Basophils Relative 0 %   Basophils Absolute 0.0 0.0 - 0.1 K/uL   Immature Granulocytes 1 %   Abs Immature Granulocytes 0.05 0.00 - 0.07 K/uL    Comment: Performed at [facility], [address] [place], [place] [address]  Protime-INR     Status: Abnormal   Collection Time: 02/13/23  3:04 PM  Result Value Ref Range   Prothrombin Time 16.4 (H) 11.4 - 15.2 seconds   INR 1.3 (H) 0.8 - 1.2    Comment: (NOTE) INR goal varies based on device and disease states. Performed at [facility], [address], [place], [place] [address]   APTT     Status: None   Collection Time: 02/13/23  3:04 PM  Result Value Ref Range   aPTT 33 24 - 36 seconds    Comment: Performed at [facility], [address] [place] [place], [place] [address]  Lithium level     Status: Abnormal   Collection Time: 02/13/23  3:04 PM  Result Value Ref Range   Lithium Lvl 0.08 (L) 0.60 - 1.20 mmol/L    Comment: Performed at [facility], [address], [place], [place] [address]  Troponin I (High Sensitivity)     Status: None   Collection Time:  02/13/23  3:04 PM  Result Value Ref Range   Troponin I (High Sensitivity) 4 <18 ng/L    Comment: (NOTE) Elevated  high sensitivity troponin I (hsTnI) values and significant  changes across serial measurements may suggest ACS but many other  chronic and acute conditions are known to elevate hsTnI results.  Refer to the "Links" section for chest pain algorithms and additional  guidance. Performed at [facility], [address], [place], [place] [address]   Lipase, blood     Status: None   Collection Time: 02/13/23  3:04 PM  Result Value Ref Range   Lipase 19 11 - 51 U/L    Comment: Performed at [facility], [address] [place], [place] [address]  Troponin I (High Sensitivity)     Status: None   Collection Time: 02/13/23  5:00 PM  Result Value Ref Range   Troponin I (High Sensitivity) 7 <18 ng/L    Comment: (NOTE) Elevated high sensitivity troponin I (hsTnI) values and significant  changes across serial measurements may suggest ACS but many other  chronic and acute conditions are known to elevate hsTnI results.  Refer to the "Links" section for chest pain algorithms and additional  guidance. Performed at [facility], [address], [place], [place] [address]   Lactic acid, plasma     Status: Abnormal   Collection Time: 02/13/23  5:00 PM  Result Value Ref Range   Lactic Acid, Venous 2.0 (HH) 0.5 - 1.9 mmol/L    Comment: CRITICAL VALUE NOTED.  VALUE IS CONSISTENT WITH PREVIOUSLY REPORTED AND CALLED VALUE. Performed at [facility], [address], [place], [place] [address]   Urinalysis, w/ Reflex to Culture (Infection Suspected) -Urine, Catheterized     Status: Abnormal   Collection Time: 02/13/23  5:25 PM  Result Value Ref Range   Specimen Source URINE, CATHETERIZED    Color, Urine YELLOW YELLOW   APPearance HAZY (A) CLEAR   Specific Gravity, Urine 1.015 1.005 - 1.030   pH 6.0 5.0 - 8.0   Glucose, UA >=500 (A) NEGATIVE mg/dL   Hgb urine dipstick MODERATE (A) NEGATIVE    Bilirubin Urine NEGATIVE NEGATIVE   Ketones, ur 20 (A) NEGATIVE mg/dL   Protein, ur 30 (A) NEGATIVE mg/dL   Nitrite NEGATIVE NEGATIVE   Leukocytes,Ua MODERATE (A) NEGATIVE   RBC / HPF 11-20 0 - 5 RBC/hpf   WBC, UA >50 0 - 5 WBC/hpf    Comment:        Reflex urine culture not performed if WBC <=10, OR if Squamous epithelial cells >5. If Squamous epithelial cells >5 suggest recollection.    Bacteria, UA FEW (A) NONE SEEN   Squamous Epithelial / HPF 0-5 0 - 5 /HPF   WBC Clumps PRESENT     Comment: Performed at [facility], [address], [place], [place] [address]  CBG monitoring, ED     Status: Abnormal   Collection Time: 02/14/23  1:35 AM  Result Value Ref Range   Glucose-Capillary 323 (H) 70 - 99 mg/dL    Comment: Glucose reference range applies only to samples taken after fasting for at least 8 hours.   MR Brain Wo Contrast (neuro protocol)  Result Date: 02/13/2023 CLINICAL DATA:  Altered mental status EXAM: MRI HEAD WITHOUT CONTRAST TECHNIQUE: Multiplanar, multiecho pulse sequences of the brain and surrounding structures were obtained without intravenous contrast. COMPARISON:  CT head 08/17/2021 FINDINGS: The study was not completed. Sagittal T1, axial and coronal DWI, and axial T2 images were obtained. The sagittal T1 sequence is essentially nondiagnostic to motion artifact due. Brain: There is no evidence of acute infarct on the diffusion-weighted  images. There is no definite acute intracranial hemorrhage on the provided sequences. Parenchymal volume is normal. The ventricles are normal in size. Parenchymal signal is grossly unremarkable on the provided sequences. There is no definite mass lesion.  There is no midline shift. Vascular: Normal flow voids. Skull and upper cervical spine: Not well assessed on the provided sequences. Sinuses/Orbits: The paranasal sinuses are clear. The globes and orbits are grossly unremarkable. Other: There is possible fluid in the right mastoid air cells.  IMPRESSION: Incomplete study with limited sequences obtained as above. No definite evidence of acute intracranial pathology on the provided sequences, which do include diagnostic quality DWI sequences. Standard repeat MRI when the patient can tolerate as indicated. Electronically Signed   By: Lesia Hausen M.D.   On: 02/13/2023 21:56   DG Chest Port 1 View  Result Date: 02/13/2023 CLINICAL DATA:  Questionable sepsis - evaluate for abnormality. Decreased lung sounds. Diaphoretic. EXAM: PORTABLE CHEST 1 VIEW COMPARISON:  01/24/2023. FINDINGS: Redemonstration of linear area of scarring/atelectasis at the left lung base. Bilateral lung fields are otherwise clear. No acute consolidation or lung collapse. Bilateral lateral costophrenic angles are clear. Stable cardio-mediastinal silhouette. No acute osseous abnormalities. The soft tissues are within normal limits. IMPRESSION: No acute cardiopulmonary process. Electronically Signed   By: Jules Schick M.D.   On: 02/13/2023 16:36    Pending Labs Unresulted Labs (From admission, onward)     Start     Ordered   02/13/23 1725  Urine Culture  Once,   R        02/13/23 1725   02/13/23 1443  Lactic acid, plasma  (Septic presentation on arrival (screening labs, nursing and treatment orders for obvious sepsis))  STAT Now then every 2 hours,   R (with STAT occurrences)      02/13/23 1443   02/13/23 1443  Blood Culture (routine x 2)  (Septic presentation on arrival (screening labs, nursing and treatment orders for obvious sepsis))  BLOOD CULTURE X 2,   STAT      02/13/23 1443   Signed and Held  Ammonia  Add-on,   R        Signed and Held   Signed and Held  HIV Antibody (routine testing w rflx)  (HIV Antibody (Routine testing w reflex) panel)  Once,   R        Signed and Held   Signed and Held  Comprehensive metabolic panel  Tomorrow morning,   R        Signed and Held   Signed and Held  Magnesium  Tomorrow morning,   R        Signed and Held   Signed and Held   CBC with Differential/Platelet  Tomorrow morning,   R        Signed and Held   Signed and Held  TSH  Tomorrow morning,   R        Signed and Held   Signed and Held  Hemoglobin A1c  (Glycemic Control (SSI)  Q 4 Hours / Glycemic Control (SSI)  AC +/- HS)  Tomorrow morning,   R       Comments: To assess prior glycemic control    Signed and Held            Vitals/Pain Today's Vitals   02/14/23 0230 02/14/23 0240 02/14/23 0250 02/14/23 0300  BP: (!) 143/126 (!) 109/96 (!) 87/61 (!) 112/46  Pulse: (!) 127 (!) 121  (!) 117  Resp: Marland Kitchen)  21 (!) 23 18 (!) 21  Temp:      TempSrc:      SpO2: 96% 94%  94%  Weight:      PainSc:        Isolation Precautions No active isolations  Medications Medications  cefTRIAXone (ROCEPHIN) 2 g in sodium chloride 0.9 % 100 mL IVPB (0 g Intravenous Stopped 02/13/23 1600)  midodrine (PROAMATINE) tablet 10 mg (has no administration in time range)  hydrOXYzine (ATARAX) tablet 25 mg (has no administration in time range)  LORazepam (ATIVAN) tablet 1 mg (1 mg Oral Given 02/14/23 0147)  OLANZapine (ZYPREXA) tablet 10 mg (10 mg Oral Given 02/14/23 0147)  lactulose (CHRONULAC) 10 GM/15ML solution 20 g (has no administration in time range)  apixaban (ELIQUIS) tablet 5 mg (5 mg Oral Given 02/14/23 0158)  gabapentin (NEURONTIN) capsule 300 mg (has no administration in time range)  acetaminophen (TYLENOL) tablet 650 mg (has no administration in time range)    Or  acetaminophen (TYLENOL) suppository 650 mg (has no administration in time range)  oxyCODONE (Oxy IR/ROXICODONE) immediate release tablet 5 mg (has no administration in time range)  morphine (PF) 2 MG/ML injection 2 mg (2 mg Intravenous Given 02/14/23 0150)  ondansetron (ZOFRAN) tablet 4 mg ( Oral See Alternative 02/14/23 0149)    Or  ondansetron (ZOFRAN) injection 4 mg (4 mg Intravenous Given 02/14/23 0149)  insulin aspart (novoLOG) injection 0-15 Units (11 Units Subcutaneous Given 02/14/23 0200)  0.9 %  sodium  chloride infusion (0 mLs Intravenous Hold 02/14/23 0127)  norepinephrine (LEVOPHED) 4mg  in (0.016 mg/mL) premix infusion (0 mcg/min Intravenous Hold 02/14/23 0128)  sodium chloride 0.9 % bolus 2,000 mL (0 mLs Intravenous Stopped 02/13/23 1639)  acetaminophen (TYLENOL) tablet 650 mg (650 mg Oral Given 02/13/23 1504)  ibuprofen (ADVIL) tablet 400 mg (400 mg Oral Given 02/13/23 1656)  sodium chloride 0.9 % bolus 1,000 mL (0 mLs Intravenous Stopped 02/13/23 1851)  acetaminophen (TYLENOL) tablet 650 mg (650 mg Oral Given 02/13/23 1842)  oxyCODONE-acetaminophen (PERCOCET/ROXICET) 5-325 MG per tablet 1 tablet (1 tablet Oral Given 02/13/23 2037)    And  oxyCODONE (Oxy IR/ROXICODONE) immediate release tablet 5 mg (5 mg Oral Given 02/13/23 2037)  lactated ringers bolus 1,000 mL (0 mLs Intravenous Stopped 02/14/23 0109)    Mobility non-ambulatory     Focused Assessments    R Recommendations: See Admitting Provider Note  Report given to:   Additional Notes: A&O; 18G RAC; 2L [place]

## 2023-02-14 NOTE — Progress Notes (Signed)
PHARMACY - PHYSICIAN COMMUNICATION CRITICAL VALUE ALERT - BLOOD CULTURE IDENTIFICATION (BCID)  [name] is an 67 y.o. female who presented to [facility] on 02/13/2023 with a chief complaint of altered mental status  Assessment:  67 y.o. female with medical history significant of bipolar disorder, depression, drug abuse, GERD, lung cancer, diabetes mellitus type 2, on Eliquis for an unknown reason and patient is not able to elaborate, presents to ED with a chief complaint of altered mental status.  BCID cx + E.Coli with CTX resistance=> Likely ESBL E. Coli in 4/4 bottles.  Tmax 102.7. Likely urinary source.   Name of physician (or Provider) Contacted: Dr. Lucianne Muss  Current antibiotics: ceftriaxone 2gm IV q24h  Changes to prescribed antibiotics recommended:  Plan to change ceftriaxone to Merrem 1gm IV q8h  Results for orders placed or performed during the hospital encounter of 02/13/23  Blood Culture ID Panel (Reflexed) (Collected: 02/13/2023  3:04 PM)  Result Value Ref Range   Enterococcus faecalis NOT DETECTED NOT DETECTED   Enterococcus Faecium NOT DETECTED NOT DETECTED   Listeria monocytogenes NOT DETECTED NOT DETECTED   Staphylococcus species NOT DETECTED NOT DETECTED   Staphylococcus aureus (BCID) NOT DETECTED NOT DETECTED   Staphylococcus epidermidis NOT DETECTED NOT DETECTED   Staphylococcus lugdunensis NOT DETECTED NOT DETECTED   Streptococcus species NOT DETECTED NOT DETECTED   Streptococcus agalactiae NOT DETECTED NOT DETECTED   Streptococcus pneumoniae NOT DETECTED NOT DETECTED   Streptococcus pyogenes NOT DETECTED NOT DETECTED   A.calcoaceticus-baumannii NOT DETECTED NOT DETECTED   Bacteroides fragilis NOT DETECTED NOT DETECTED   Enterobacterales DETECTED (A) NOT DETECTED   Enterobacter cloacae complex NOT DETECTED NOT DETECTED   Escherichia coli DETECTED (A) NOT DETECTED   Klebsiella aerogenes NOT DETECTED NOT DETECTED   Klebsiella oxytoca NOT DETECTED NOT DETECTED    Klebsiella pneumoniae NOT DETECTED NOT DETECTED   Proteus species NOT DETECTED NOT DETECTED   Salmonella species NOT DETECTED NOT DETECTED   Serratia marcescens NOT DETECTED NOT DETECTED   Haemophilus influenzae NOT DETECTED NOT DETECTED   Neisseria meningitidis NOT DETECTED NOT DETECTED   Pseudomonas aeruginosa NOT DETECTED NOT DETECTED   Stenotrophomonas maltophilia NOT DETECTED NOT DETECTED   Candida albicans NOT DETECTED NOT DETECTED   Candida auris NOT DETECTED NOT DETECTED   Candida glabrata NOT DETECTED NOT DETECTED   Candida krusei NOT DETECTED NOT DETECTED   Candida parapsilosis NOT DETECTED NOT DETECTED   Candida tropicalis NOT DETECTED NOT DETECTED   Cryptococcus neoformans/gattii NOT DETECTED NOT DETECTED   CTX-M ESBL DETECTED (A) NOT DETECTED   Carbapenem resistance IMP NOT DETECTED NOT DETECTED   Carbapenem resistance KPC NOT DETECTED NOT DETECTED   Carbapenem resistance NDM NOT DETECTED NOT DETECTED   Carbapenem resist OXA 48 LIKE NOT DETECTED NOT DETECTED   Carbapenem resistance VIM NOT DETECTED NOT DETECTED    Tera Mater 02/14/2023  10:38 AM

## 2023-02-14 NOTE — Assessment & Plan Note (Signed)
-   Lithium level low - Continue lithium, continue Zyprexa, continue BuSpar, continue Ativan - Patient reportedly has been agitated in the ER and in the ICU - May end up needing extra medication as needed for agitation

## 2023-02-14 NOTE — Progress Notes (Addendum)
[facility] Progress Note  Patient: [name]    QIH:[id number]  DOA: 02/13/2023     Date of Service: the patient was seen and examined on 02/14/2023  Chief Complaint  Patient presents with   Altered Mental Status   Brief hospital course: [name] is a 67 y.o. female with medical history significant of bipolar disorder, depression, drug abuse, GERD, lung cancer s/p right upper lobectomy, diabetes mellitus type 2, on Eliquis for an unknown reason and patient is not able to elaborate, presents to ED with a chief complaint of altered mental status.  Patient reports she does not remember why she is here.  She says that she was told that she had convulsions.  Then she starts to explain to me that she is here because the staff at the nursing home put feces inside her.  This is clearly not why she is here, and she remains confused.  She reports she is in pain at the time of my exam.  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  She takes Percocet tens, 4 times per day at home.  I have advised her that that is too much opiate medication especially when she was already having altered mental status and is confused, so she will have reduced dose of opiate medication here.  Patient seems agitated about this but accepts it.  No further history could be obtained at this time.   Patient will remain full code by default as she has no DNR at bedside and she is too confused to have this conversation.   Assessment and Plan: Acute metabolic encephalopathy - Patient brought in unresponsive from [facility] - MRI is incomplete due to motion, but had no definite evidence of acute intracranial abnormality - Patient maintaining oxygen sats on 2 L nasal cannula - Lithium level is low - She does have a UTI which is likely contributing - She also has polypharmacy with Ativan and large doses of opiates at baseline - Continue to monitor 10/2 AME resolved, patient is back to her baseline.  Continue to  monitor.   Sepsis due to UTI (urinary tract infection) ESBL E. coli bacteremia - Fever at 102.7, tachycardia 146, tachypnea 33, lactic acidosis at 2.7, hypotension 86/58 Lactic acid 1.1, trended down to normal level - Patient received 3 L of fluid and then was still hypotensive - She then received another liter of fluid and had intermittent hypotension - Pressors were ordered but not started - Patient is COVID-negative - Chest x-ray is without acute findings Renal sonogram negative for any obstruction or fluid collection  UA indicative of UTI - Blood cultures growing ESBL E. coli urine culture pending s/p Rocephin, started meropenem 1 g IV every 8 hourly - Continue to monitor Follow complete blood culture report and consider ID consult tomorrow a.m. - Monitor closely for hypotension - s/p Midodrine 10 mg p.o. 3 times daily, BP elevated, changed midodrine as needed if SBP less than 100  Hypokalemia, potassium repleted Hypophosphatemia, Phos elevated. Hypomagnesemia, mag repleted. Monitor electrolytes and replete as needed.   Severe bipolar I disorder, most recent episode depressed (HCC) - Lithium level low - Continue lithium, continue Zyprexa, continue BuSpar, continue Ativan - Patient reportedly has been agitated in the ER and in the ICU - May end up needing extra medication as needed for agitation 10/2 currently stable, back to her baseline.  Continue current medication and supportive care  Controlled type 2 diabetes mellitus with hyperglycemia (HCC) - Uncontrolled with  hyperglycemia 426 at arrival, hemoglobin A1c 10.9 - Hold metformin - Sliding scale coverage - Continue to monitor     Hypotension due to sepsis, resolved  Monitor BP and titrate medications accordingly   Insomnia and restless leg syndrome Started trazodone 50 mg p.o. nightly and Requip 0.5 mg p.o. nightly  Hyperthyroidism 10/2 started methimazole 15 mg p.o. daily TSH 0.102 very low, free T4 level 1.43  Slightly elevated Follow-up free T4 level daily Follow free T3 level, pending Follow-up thyroid sonogram and thyroid antibodies Follow-up with endocrinologist as an outpatient for further workup and management   Right foot drop, chronic Patient will benefit from referral to orthopedic surgery as an outpatient  Body mass index is 32.85 kg/m.  Interventions:  Diet: Carb modified and heart healthy diet  DVT Prophylaxis: Therapeutic Anticoagulation with Eliquis    Advance goals of care discussion: Full code  Family Communication: family was not present at bedside, at the time of interview.  The pt provided permission to discuss medical plan with the family. Opportunity was given to ask question and all questions were answered satisfactorily.   Disposition:  Pt is from [facility], admitted with AMS, found to have sepsis, ESBL E. coli bacteremia UTI, hypokalemia, electrolyte imbalance, still on IV antibiotics and has electrolyte imbalance, which precludes a safe discharge. Discharge to SNF, when stable, may need few days more.  Subjective: Patient was admitted overnight due to altered mental status.  In the morning time patient is back to her baseline, she is AOx3, does not know why she is here in the hospital.  But she is able to keep the conversation.  Complaining of pain in the bilateral feet, patient has right foot drop. Patient stated that she has insomnia, did not sleep well last night, also has restless leg syndrome.  Sometimes she takes Xanax or Ativan for sleep.  Physical Exam: General: NAD, lying comfortably Appear in no distress, affect appropriate Eyes: PERRLA ENT: Oral Mucosa Clear, moist  Neck: no JVD,  Cardiovascular: S1 and S2 Present, no Murmur,  Respiratory: good respiratory effort, Bilateral Air entry equal and Decreased, no Crackles, no wheezes Abdomen: Bowel Sound present, Soft and no tenderness,  Skin: no rashes Extremities: 2+ Pedal edema, no calf  tenderness, right foot drop (chronic) Neurologic: without any new focal findings Gait not checked due to patient safety concerns  Vitals:   02/14/23 1100 02/14/23 1200 02/14/23 1300 02/14/23 1400  BP:  (!) 150/58    Pulse: 94 (!) 103 (!) 111 95  Resp: (!) 22 (!) 24 (!) 23 (!) 24  Temp:      TempSrc:      SpO2: 100% 100% 96% 93%  Weight:      Height:        Intake/Output Summary (Last 24 hours) at 02/14/2023 1440 Last data filed at 02/14/2023 1248 Gross per 24 hour  Intake 4820 ml  Output 1700 ml  Net 3120 ml   Filed Weights   02/13/23 1443 02/14/23 0352  Weight: 96.2 kg 100.9 kg    Data Reviewed: I have personally reviewed and interpreted daily labs, tele strips, imagings as discussed above. I reviewed all nursing notes, pharmacy notes, vitals, pertinent old records I have discussed plan of care as described above with RN and patient/family.  CBC: Recent Labs  Lab 02/13/23 1504 02/14/23 0510  WBC 9.9 12.0*  NEUTROABS 8.7* 10.0*  HGB 15.8* 12.8  HCT 47.8* 40.0  MCV 90.9 92.6  PLT 145* 117*  Basic Metabolic Panel: Recent Labs  Lab 02/13/23 1504 02/14/23 0510 02/14/23 0841  NA 134* 138  --   K 3.4* 3.0*  --   CL 96* 104  --   CO2 24 24  --   GLUCOSE 426* 221*  --   BUN 10 9  --   CREATININE 0.59 0.46  --   CALCIUM 9.3 8.5*  --   MG  --  1.6*  --   PHOS  --   --  1.5*    Studies: US RENAL  Result Date: 02/14/2023 CLINICAL DATA:  Acute kidney injury. EXAM: RENAL / URINARY TRACT ULTRASOUND COMPLETE COMPARISON:  CT of the abdomen and pelvis 01/24/2023 FINDINGS: Right Kidney: Renal measurements: 13.4 x 5.0 x 7.4 cm = volume: 259.9 mL. Echogenicity within normal limits. No mass or hydronephrosis visualized. Left Kidney: Renal measurements: 14.8 x 6.6 x 6.9 cm = volume: 353.2 mL. Echogenicity within normal limits. No mass or hydronephrosis visualized. Bladder: Appears normal for degree of bladder distention. Other: A 9 mm shadowing stone is incidentally noted  within a contracted gallbladder. IMPRESSION: Normal renal ultrasound. Electronically Signed   By: Marin Roberts M.D.   On: 02/14/2023 13:46   MR Brain Wo Contrast (neuro protocol)  Result Date: 02/13/2023 CLINICAL DATA:  Altered mental status EXAM: MRI HEAD WITHOUT CONTRAST TECHNIQUE: Multiplanar, multiecho pulse sequences of the brain and surrounding structures were obtained without intravenous contrast. COMPARISON:  CT head 08/17/2021 FINDINGS: The study was not completed. Sagittal T1, axial and coronal DWI, and axial T2 images were obtained. The sagittal T1 sequence is essentially nondiagnostic to motion artifact due. Brain: There is no evidence of acute infarct on the diffusion-weighted images. There is no definite acute intracranial hemorrhage on the provided sequences. Parenchymal volume is normal. The ventricles are normal in size. Parenchymal signal is grossly unremarkable on the provided sequences. There is no definite mass lesion.  There is no midline shift. Vascular: Normal flow voids. Skull and upper cervical spine: Not well assessed on the provided sequences. Sinuses/Orbits: The paranasal sinuses are clear. The globes and orbits are grossly unremarkable. Other: There is possible fluid in the right mastoid air cells. IMPRESSION: Incomplete study with limited sequences obtained as above. No definite evidence of acute intracranial pathology on the provided sequences, which do include diagnostic quality DWI sequences. Standard repeat MRI when the patient can tolerate as indicated. Electronically Signed   By: Lesia Hausen M.D.   On: 02/13/2023 21:56   DG Chest Port 1 View  Result Date: 02/13/2023 CLINICAL DATA:  Questionable sepsis - evaluate for abnormality. Decreased lung sounds. Diaphoretic. EXAM: PORTABLE CHEST 1 VIEW COMPARISON:  01/24/2023. FINDINGS: Redemonstration of linear area of scarring/atelectasis at the left lung base. Bilateral lung fields are otherwise clear. No acute  consolidation or lung collapse. Bilateral lateral costophrenic angles are clear. Stable cardio-mediastinal silhouette. No acute osseous abnormalities. The soft tissues are within normal limits. IMPRESSION: No acute cardiopulmonary process. Electronically Signed   By: Jules Schick M.D.   On: 02/13/2023 16:36    Scheduled Meds:  apixaban  5 mg Oral BID   busPIRone  5 mg Oral BID   Chlorhexidine Gluconate Cloth  6 each Topical Daily   gabapentin  300 mg Oral TID   [START ON 02/15/2023] influenza vaccine adjuvanted  0.5 mL Intramuscular Tomorrow-1000   insulin aspart  0-15 Units Subcutaneous Q4H   lithium carbonate  600 mg Oral Daily   LORazepam  1 mg Oral QHS   midodrine  10 mg Oral TID WC   mupirocin ointment   Nasal BID   OLANZapine  10 mg Oral QHS   Continuous Infusions:  sodium chloride Stopped (02/14/23 0127)   sodium chloride 75 mL/hr at 02/14/23 0944   meropenem (MERREM) IV 1 g (02/14/23 1151)   norepinephrine (LEVOPHED) Adult infusion Stopped (02/14/23 0128)   PRN Meds: acetaminophen **OR** acetaminophen, hydrOXYzine, lactulose, morphine injection, ondansetron **OR** ondansetron (ZOFRAN) IV, oxyCODONE  Time spent: 55 minutes  Author: Gillis Santa. MD [facility] 02/14/2023 2:40 PM  To reach On-call, see care teams to locate the attending and reach out to them via [email or web address]. If 7PM-7AM, please contact night-coverage If you still have difficulty reaching the attending provider, please page the [facility] (Director on Call) for [facility] on amion for assistance.

## 2023-02-14 NOTE — Assessment & Plan Note (Signed)
-   Patient brought in unresponsive from [facility] - MRI is incomplete due to motion, but had no definite evidence of acute intracranial abnormality - Patient maintaining oxygen sats on 2 L nasal cannula - Lithium level is low - She does have a UTI which is likely contributing - She also has polypharmacy with Ativan and large doses of opiates at baseline - Continue to monitor

## 2023-02-14 NOTE — Assessment & Plan Note (Signed)
-   Potassium 3.4 - Replace and recheck - Continue to monitor

## 2023-02-14 NOTE — Progress Notes (Signed)
Came to patient room at RN request.  Patient BS were clear and diminished.  Patient is complaining of being denied water or milk.  RN and this RT explained to patient why.  Patient did have emesis episode while we were in room.

## 2023-02-14 NOTE — Progress Notes (Signed)
Date and time results received: 02/14/23 0500   Test: blood cultures  Critical Value: positive blood cultures gram negative rods in both anaerobic bottles   Name of Provider Notified: Do. Zierle-ghosh

## 2023-02-14 NOTE — Evaluation (Signed)
Physical Therapy Evaluation Patient Details Name: [name] MRN: [id number] DOB: 1956-05-15 Today's Date: 02/14/2023  History of Present Illness  [name] is a 67 y.o. female with medical history significant of bipolar disorder, depression, drug abuse, GERD, lung cancer, diabetes mellitus type 2, on Eliquis for an unknown reason and patient is not able to elaborate, presents to ED with a chief complaint of altered mental status.  Patient reports she does not remember why she is here.  She says that she was told that she had convulsions.  Then she starts to explain to me that she is here because the staff at the nursing home put feces inside her.  This is clearly not why she is here, and she remains confused.  She reports she is in pain at the time of my exam.  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  She takes Percocet tens, 4 times per day at home.  I have advised her that that is too much opiate medication especially when she was already having altered mental status and is confused, so she will have reduced dose of opiate medication here.  Patient seems agitated about this but accepts it.  No further history could be obtained at this time.   Clinical Impression  Patient demonstrates slow labored movement for sitting up at bedside with poor carryover for moving RLE due to weakness, contractures in ankle, once seated able to maintain sitting balance, but unable to stand after repeated attempts due to BLE weakness.  Patient unable to scoot laterally due to limited use of BUE due to shoulder pain and required Max assist to reposition when put back to bed.  Patient will benefit from continued skilled physical therapy in hospital and recommended venue below to increase strength, balance, endurance for safe ADLs and gait.          If plan is discharge home, recommend the following: A lot of help with bathing/dressing/bathroom;A lot of help with walking and/or transfers;Help with stairs or ramp  for entrance;Assistance with cooking/housework   Can travel by private vehicle   No    Equipment Recommendations None recommended by PT  Recommendations for Other Services       Functional Status Assessment Patient has had a recent decline in their functional status and/or demonstrates limited ability to make significant improvements in function in a reasonable and predictable amount of time     Precautions / Restrictions Precautions Precautions: Fall Restrictions Weight Bearing Restrictions: No      Mobility  Bed Mobility Overal bed mobility: Needs Assistance Bed Mobility: Supine to Sit, Sit to Supine     Supine to sit: Mod assist Sit to supine: Mod assist, Max assist   General bed mobility comments: slow labored movement with difficulty moving RLE due to weakness/contractures    Transfers                        Ambulation/Gait                  Stairs            Wheelchair Mobility     Tilt Bed    Modified Rankin (Stroke Patients Only)       Balance Overall balance assessment: Needs assistance Sitting-balance support: Feet supported, No upper extremity supported Sitting balance-Leahy Scale: Fair Sitting balance - Comments: seated at EOB  Pertinent Vitals/Pain Pain Assessment Pain Assessment: Faces Faces Pain Scale: Hurts little more Pain Location: left foot Pain Descriptors / Indicators: Sore, Guarding, Grimacing Pain Intervention(s): Limited activity within patient's tolerance, Monitored during session, Repositioned    Home Living Family/patient expects to be discharged to:: Skilled nursing facility                        Prior Function Prior Level of Function : Needs assist       Physical Assist : Mobility (physical);ADLs (physical) Mobility (physical): Bed mobility;Transfers;Gait;Stairs   Mobility Comments: Mechanical lift transfers per patient, able to  propell self in wheelchair ADLs Comments: Assisted by SNF staff     Extremity/Trunk Assessment   Upper Extremity Assessment Upper Extremity Assessment: Generalized weakness    Lower Extremity Assessment Lower Extremity Assessment: Generalized weakness;RLE deficits/detail RLE Deficits / Details: grossly 1/5, 0/5 ankle dorsiflexion, plantar flexor contractures RLE Sensation: decreased light touch;decreased proprioception RLE Coordination: decreased fine motor;decreased gross motor    Cervical / Trunk Assessment Cervical / Trunk Assessment: Normal  Communication   Communication Communication: No apparent difficulties  Cognition Arousal: Alert Behavior During Therapy: WFL for tasks assessed/performed Overall Cognitive Status: Within Functional Limits for tasks assessed                                          General Comments      Exercises     Assessment/Plan    PT Assessment Patient needs continued PT services  PT Problem List Decreased strength;Decreased activity tolerance;Decreased balance;Decreased mobility       PT Treatment Interventions DME instruction;Functional mobility training;Therapeutic activities;Therapeutic exercise;Balance training;Wheelchair mobility training;Patient/family education    PT Goals (Current goals can be found in the Care Plan section)  Acute Rehab PT Goals Patient Stated Goal: return home PT Goal Formulation: With patient Time For Goal Achievement: 02/28/23 Potential to Achieve Goals: Good    Frequency Min 2X/week     Co-evaluation               AM-PAC PT "6 Clicks" Mobility  Outcome Measure Help needed turning from your back to your side while in a flat bed without using bedrails?: A Little Help needed moving from lying on your back to sitting on the side of a flat bed without using bedrails?: A Lot Help needed moving to and from a bed to a chair (including a wheelchair)?: A Lot Help needed standing up  from a chair using your arms (e.g., wheelchair or bedside chair)?: Total Help needed to walk in hospital room?: Total Help needed climbing 3-5 steps with a railing? : Total 6 Click Score: 10    End of Session   Activity Tolerance: Patient tolerated treatment well;Patient limited by fatigue Patient left: in bed;with call bell/phone within reach Nurse Communication: Mobility status PT Visit Diagnosis: Unsteadiness on feet (R26.81);Other abnormalities of gait and mobility (R26.89);Muscle weakness (generalized) (M62.81)    Time: 2956-2130 PT Time Calculation (min) (ACUTE ONLY): 26 min   Charges:   PT Evaluation $PT Eval Moderate Complexity: 1 Mod PT Treatments $Therapeutic Activity: 23-37 mins PT General Charges $$ ACUTE PT VISIT: 1 Visit         1:41 PM, 02/14/23 Ocie Bob, MPT Physical Therapist with [facility] [phone] office [phone] mobile phone

## 2023-02-14 NOTE — Plan of Care (Signed)

## 2023-02-14 NOTE — Assessment & Plan Note (Addendum)
-   Fever at 102.7, tachycardia 146, tachypnea 33, lactic acidosis at 2.7, hypotension 86/58 - Patient received 3 L of fluid and then was still hypotensive - She then received another liter of fluid and had intermittent hypotension - Pressors were ordered but not started - Patient is COVID-negative - Chest x-ray is without acute findings - Urine is likely the source with the UA indicative of UTI - Final read on blood cultures and urine culture pending, but patient did grow gram-negative rods in both anaerobic bottles for her blood cultures - Continue Rocephin - Continue fluids - Monitor closely for hypotension - Continue midodrine

## 2023-02-14 NOTE — Progress Notes (Signed)
Noted improvement in septic shock.  PCCM available as needed.

## 2023-02-14 NOTE — ED Notes (Signed)
Patient woke up complaining of being cold; temperature checked to be 97.9 oral. Patient provided multiple blankets. Patient asked to be rolled onto side. Approx later, patient very anxious, increased respirations and work of breathing. This nurse and another nurse provided patient with emotional support and provided instructions to help calm breathing. Patient not cooperative with instructions. ED provider notified of change in patient condition. Patient administered due medications including Ativan. Respiratory called for assessment. ED provider at bedside to assess patient condition.

## 2023-02-14 NOTE — Assessment & Plan Note (Signed)
-   Uncontrolled with hyperglycemia 426 at arrival - Hold metformin - Sliding scale coverage - Continue to monitor

## 2023-02-14 NOTE — TOC Initial Note (Signed)
Transition of Care [facility]) - Initial/Assessment Note    Patient Details  Name: [name] MRN: [id number] Date of Birth: Jul 22, 1955  Transition of Care [facility]) CM/SW Contact:    Villa Herb, LCSWA Phone Number: 02/14/2023, 9:58 AM  Clinical Narrative:                 Pt is high risk for readmission. CSW noted per chart review that pt arrived to hospital from [facility]. CSW spoke with Debbie at [facility] who states that pt is a long term resident at their facility. Debbie states pt can return at D/C once medically stable. CSW updated by Eunice Blase that pt was supposed to appear in court this morning 10/2 at 9am. CSW completed letter that proves pt was admitted to the hospital, CSW spoke to clerk of court and faxed over at their request. TOC to follow.   Expected Discharge Plan: Skilled Nursing Facility Barriers to Discharge: Continued Medical Work up   Patient Goals and CMS Choice Patient states their goals for this hospitalization and ongoing recovery are:: return to [email or web address] Compare Post Acute Care list provided to:: Patient Choice offered to / list presented to : Patient      Expected Discharge Plan and Services In-house Referral: Clinical Social Work Discharge Planning Services: CM Consult Post Acute Care Choice: Skilled Nursing Facility Living arrangements for the past 2 months: Skilled Nursing Facility                                      Prior Living Arrangements/Services Living arrangements for the past 2 months: Skilled Nursing Facility Lives with:: Facility Resident Patient language and need for interpreter reviewed:: Yes Do you feel safe going back to the place where you live?: Yes      Need for Family Participation in Patient Care: Yes (Comment) Care giver support system in place?: Yes (comment)   Criminal Activity/Legal Involvement Pertinent to Current Situation/Hospitalization: No - Comment as needed  Activities of Daily Living    ADL Screening (condition at time of admission) Independently performs ADLs?: No Does the patient have a NEW difficulty with bathing/dressing/toileting/self-feeding that is expected to last >3 days?: No Does the patient have a NEW difficulty with getting in/out of bed, walking, or climbing stairs that is expected to last >3 days?: No Does the patient have a NEW difficulty with communication that is expected to last >3 days?: No Is the patient deaf or have difficulty hearing?: No Does the patient have difficulty seeing, even when wearing glasses/contacts?: No Does the patient have difficulty concentrating, remembering, or making decisions?: Yes  Permission Sought/Granted                  Emotional Assessment Appearance:: Appears stated age       Alcohol / Substance Use: Not Applicable Psych Involvement: No (comment)  Admission diagnosis:  Acute metabolic encephalopathy [G93.41] Septic shock (HCC) [A41.9, R65.21] Sepsis (HCC) [A41.9] Patient Active Problem List   Diagnosis Date Noted   Septic shock (HCC) 02/14/2023   Sepsis (HCC) 02/14/2023   Controlled type 2 diabetes mellitus with hyperglycemia (HCC) 02/14/2023   Acute left ankle pain 12/13/2021   UTI (urinary tract infection) 08/03/2021   Hypernatremia 07/30/2021   Acute urinary retention 07/30/2021   Hypokalemia 07/30/2021   Hypotension 07/29/2021   Ankle wound, left 07/29/2021   Severe sepsis (HCC) 07/28/2021   Dyslipidemia 07/28/2021  Acute metabolic encephalopathy 07/28/2021   MDD (major depressive disorder), recurrent severe, without psychosis (HCC) 11/14/2017   Severe bipolar I disorder, most recent episode depressed (HCC)    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 [place] [address] Phone: [phone] Fax: [phone]  [facility] [place] [facility] [place], [place] - [address] [place] [address] Phone: [phone] Fax: [phone]     Social Determinants of Health (SDOH) Social History: SDOH Screenings   Food Insecurity: No Food Insecurity (02/14/2023)  Housing: Low Risk  (02/14/2023)  Transportation Needs: No Transportation Needs (02/14/2023)  Utilities: Not At Risk (02/14/2023)  Alcohol Screen: Low Risk  (11/14/2017)  Social Connections: Unknown (07/05/2021)   Received from [facility]  Tobacco Use: Medium Risk (02/13/2023)   SDOH Interventions:     Readmission Risk Interventions    02/14/2023    9:57 AM 08/03/2021    1:25 PM  Readmission Risk Prevention Plan  Transportation Screening Complete Complete  PCP or Specialist Appt within 5-7 Days  Complete  Home Care Screening  Complete  Medication Review (RN CM)  Complete  Medication Review Oceanographer) Complete   HRI or Home Care Consult Complete   SW Recovery Care/Counseling Consult Complete   Palliative Care Screening Not Applicable   Skilled Nursing Facility Complete

## 2023-02-14 NOTE — Plan of Care (Signed)
  Problem: Acute Rehab PT Goals(only PT should resolve) Goal: Pt will Roll Supine to Side Outcome: Progressing Flowsheets (Taken 02/14/2023 1343) Pt will Roll Supine to Side: with min assist Goal: Pt Will Go Supine/Side To Sit Outcome: Progressing Flowsheets (Taken 02/14/2023 1343) Pt will go Supine/Side to Sit: with minimal assist Goal: Pt Will Go Sit To Supine/Side Outcome: Progressing Flowsheets (Taken 02/14/2023 1343) Pt will go Sit to Supine/Side: with minimal assist Goal: Patient Will Perform Sitting Balance Outcome: Progressing Flowsheets (Taken 02/14/2023 1343) Patient will perform sitting balance:  with modified independence  with supervision Goal: Patient Will Transfer Sit To/From Stand Outcome: Progressing Flowsheets (Taken 02/14/2023 1343) Patient will transfer sit to/from stand:  with moderate assist  with maximum assist   1:43 PM, 02/14/23 Ocie Bob, MPT Physical Therapist with [facility] [phone] office [phone] mobile phone

## 2023-02-14 NOTE — Assessment & Plan Note (Signed)
-   UA indicative of UTI - Blood cultures and urine culture pending - Previous urine culture grew multiple species and recommended recollection - Meets sepsis criteria - Continue Rocephin - Continue to monitor

## 2023-02-14 NOTE — Inpatient Diabetes Management (Signed)
Inpatient Diabetes Program Recommendations  AACE/ADA: New Consensus Statement on Inpatient Glycemic Control   Target Ranges:  Prepandial:   less than 140 mg/dL      Peak postprandial:   less than 180 mg/dL (1-2 hours)      Critically ill patients:  140 - 180 mg/dL    Latest Reference Range & Units 02/14/23 01:35 02/14/23 03:52 02/14/23 04:15 02/14/23 07:53  Glucose-Capillary 70 - 99 mg/dL 161 (H) 096 (H) 045 (H) 213 (H)    Latest Reference Range & Units 02/13/23 15:04 02/14/23 05:10  Glucose 70 - 99 mg/dL 409 (H) 811 (H)   Review of Glycemic Control  Diabetes history: DM2 Outpatient Diabetes medications: Metformin 500 mg QAM Current orders for Inpatient glycemic control: Novolog 0-15 units Q4H  Inpatient Diabetes Program Recommendations:    Insulin: Please consider ordering Semglee 10 units Q24H.   HbgA1C: A1C 10.9% on 02/14/23 indicating an average glucose 266 mg/dl over the past 2-3 months.  Outpatient DM: Patient will likely need additional DM medications prescribed at discharge.  Thanks, Orlando Penner, RN, MSN, CDCES Diabetes Coordinator Inpatient Diabetes Program [phone] (Team Pager from 8am to 5pm)

## 2023-02-14 NOTE — Progress Notes (Signed)
Date and time results received: 02/14/23 1300 (use smartphrase ".now" to insert current time)  Test: MRSA Critical Value: Positive  Name of Provider Notified: DR. Lucianne Muss  Orders Received? Or Actions Taken?: Actions Taken: Bactroban ordered, doctor made aware

## 2023-02-14 NOTE — Assessment & Plan Note (Signed)
-   See plan for sepsis - I believe there is a component of chronic soft pressures here, as she is on midodrine at home - Sepsis likely also contributing

## 2023-02-14 NOTE — H&P (Signed)
History and Physical    Patient: [name] OZH:[id number] DOB: [birth year] DOA: 02/13/2023 DOS: the patient was seen and examined on 02/14/2023 PCP: Patient, [name] Per  Patient coming from: SNF  Chief Complaint:  Chief Complaint  Patient presents with   Altered Mental Status   HPI: [name] is a 67 y.o. female with medical history significant of bipolar disorder, depression, drug abuse, GERD, lung cancer, diabetes mellitus type 2, on Eliquis for an unknown reason and patient is not able to elaborate, presents to ED with a chief complaint of altered mental status.  Patient reports she does not remember why she is here.  She says that she was told that she had convulsions.  Then she starts to explain to me that she is here because the staff at the nursing home put feces inside her.  This is clearly not why she is here, and she remains confused.  She reports she is in pain at the time of my exam.  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  She takes Percocet tens, 4 times per day at home.  I have advised her that that is too much opiate medication especially when she was already having altered mental status and is confused, so she will have reduced dose of opiate medication here.  Patient seems agitated about this but accepts it.  No further history could be obtained at this time.  Patient will remain full code by default as she has no DNR at bedside and she is too confused to have this conversation. Review of Systems: unable to review all systems due to the inability of the patient to answer questions. Past Medical History:  Diagnosis Date   Allergy    Bipolar disorder (HCC)    Depression    Drug abuse (HCC)    history of, went to rehab 2015   GERD (gastroesophageal reflux disease)    Lung cancer (HCC)    lung ca dx 11/11- right upper lobe   Past Surgical History:  Procedure Laterality Date   ABDOMINAL AORTOGRAM W/LOWER EXTREMITY Bilateral 01/10/2022   Procedure: ABDOMINAL  AORTOGRAM W/LOWER EXTREMITY;  Surgeon: Nada Libman, MD;  Location: [facility];  Service: Cardiovascular;  Laterality: Bilateral;   ANKLE FRACTURE SURGERY Left    HARDWARE REMOVAL Left 12/13/2021   Procedure: LEFT ANKLE HARDWARE REMOVAL;  Surgeon: Marcene Corning, MD;  Location: [facility];  Service: Orthopedics;  Laterality: Left;   LUNG LOBECTOMY Right 2011   RUL removed for lung cancer   STYLOID PROCESS EXCISION Left 10/16/2014   Procedure: EXCISION LEFT STYLOID PROCESS;  Surgeon: Drema Halon, MD;  Location: [facility];  Service: ENT;  Laterality: Left;   TONSILLECTOMY Bilateral 10/16/2014   Procedure: BILATERAL TONSILLECTOMY;  Surgeon: Drema Halon, MD;  Location: [facility];  Service: ENT;  Laterality: Bilateral;   TUBAL LIGATION  1984   Social History:  reports that she has quit smoking. Her smoking use included cigarettes. She has a 40 pack-year smoking history. She has never used smokeless tobacco. She reports that she does not currently use alcohol. She reports that she does not currently use drugs after having used the following drugs: Benzodiazepines.  Allergies  Allergen Reactions   Sulfa Antibiotics Swelling and Rash    Neck swelling    Family History  Problem Relation Age of Onset   Cancer Mother        breast cancer   Cancer Father  stomach cancer   Diabetes Father    Cancer Sister        breast cancer   Diabetes Brother    Diabetes Brother    Cancer Sister        breast cancer    Prior to Admission medications   Medication Sig Start Date End Date Taking? Authorizing Provider  acetaminophen (TYLENOL) 325 MG tablet Take 650 mg by mouth every 4 (four) hours as needed for mild pain.   Yes [provider]  ascorbic acid (VITAMIN C) 500 MG tablet Take 500 mg by mouth 2 (two) times daily.   Yes [provider]  busPIRone (BUSPAR) 5 MG tablet Take 5 mg by mouth 2 (two) times daily. 01/14/23  Yes  [provider]  calcium carbonate (TUMS - DOSED IN MG ELEMENTAL CALCIUM) 500 MG chewable tablet Chew 1 tablet by mouth every 5 (five) hours as needed for indigestion or heartburn.   Yes [provider]  cetirizine (ZYRTEC) 10 MG tablet Take 10 mg by mouth daily.   Yes [provider]  docusate sodium (COLACE) 100 MG capsule Take 100 mg by mouth daily as needed for mild constipation.   Yes [provider]  ELIQUIS 5 MG TABS tablet Take 5 mg by mouth 2 (two) times daily. 01/18/23  Yes [provider]  folic acid (FOLVITE) 1 MG tablet Take 1 mg by mouth daily.   Yes [provider]  gabapentin (NEURONTIN) 100 MG capsule Take 300 mg by mouth 3 (three) times daily. 07/20/21  Yes [provider]  guaiFENesin (ROBITUSSIN) 100 MG/5ML liquid Take 5 mLs by mouth every 4 (four) hours as needed for cough.   Yes [provider]  hydrochlorothiazide (HYDRODIURIL) 25 MG tablet Take 1 tablet (25 mg total) by mouth daily. 08/27/22  Yes Jacalyn Lefevre, MD  hydrOXYzine (ATARAX) 25 MG tablet Take 1 tablet (25 mg total) by mouth every 6 (six) hours as needed for itching. 08/27/22  Yes Jacalyn Lefevre, MD  Ketoconazole 2 % FOAM Apply topically. 01/12/23  Yes [provider]  lactulose (CHRONULAC) 10 GM/15ML solution Take 30 mLs by mouth daily as needed for moderate constipation. 01/18/23  Yes [provider]  lidocaine (LIDODERM) 5 % Place 1 patch onto the skin daily. Remove & Discard patch within 12 hours or as directed by MD; Apply to LOWER LEG   Yes [provider]  lithium carbonate 300 MG capsule Take 600 mg by mouth daily. 11/24/22  Yes [provider]  loperamide (IMODIUM A-D) 2 MG tablet Take 2 mg by mouth every 6 (six) hours as needed for diarrhea or loose stools.   Yes [provider]  LORazepam (ATIVAN) 1 MG tablet Take 1 mg by mouth at bedtime. 02/12/23  Yes [provider]  Melatonin 10 MG TABS  Take 10 mg by mouth at bedtime.   Yes [provider]  metFORMIN (GLUCOPHAGE) 500 MG tablet Take 1 pill a day Patient taking differently: Take 500 mg by mouth daily with breakfast. Take 1 pill a day 12/17/22  Yes Bethann Berkshire, MD  midodrine (PROAMATINE) 10 MG tablet Take 1 tablet (10 mg total) by mouth 3 (three) times daily with meals. 08/03/21  Yes Tat, Onalee Hua, MD  Multiple Vitamin (MULTIVITAMIN ADULT PO) Take 1 tablet by mouth daily.   Yes [provider]  OLANZapine (ZYPREXA) 10 MG tablet Take 10 mg by mouth at bedtime. 01/31/23  Yes [provider]  ondansetron (ZOFRAN) 4 MG  tablet Take 4 mg by mouth every 8 (eight) hours as needed. 12/14/22  Yes [provider]  oxyCODONE-acetaminophen (PERCOCET) 10-325 MG tablet Take 1 tablet by mouth every 4 (four) hours as needed. 01/23/23  Yes [provider]  potassium chloride 20 MEQ/15ML (10%) SOLN Take 10 mEq by mouth daily. 12/20/22  Yes [provider]  pyrithione zinc (SELSUN BLUE ITCHY DRY SCALP) 1 % shampoo Apply topically daily as needed for itching. 08/27/22  Yes Jacalyn Lefevre, MD  saccharomyces boulardii (FLORASTOR) 250 MG capsule Take 250 mg by mouth daily.   Yes [provider]  senna-docusate (SENOKOT-S) 8.6-50 MG tablet Take 2 tablets by mouth 2 (two) times daily. May hold for loose stools   Yes [provider]  SENNOSIDES PO Take 8.8 mg by mouth every 12 (twelve) hours as needed (constipation).   Yes [provider]  tamsulosin (FLOMAX) 0.4 MG CAPS capsule Take 0.4 mg by mouth daily.   Yes [provider]  zolpidem (AMBIEN) 5 MG tablet Take 5 mg by mouth at bedtime as needed. 01/15/23  Yes [provider]    Physical Exam: Vitals:   02/14/23 0352 02/14/23 0400 02/14/23 0405 02/14/23 0500  BP: (!) 103/42 (!) 124/96 (!) 125/58 117/75  Pulse: (!) 104 (!) 102 (!) 102 (!) 116  Resp: (!) 21 (!) 25 20 17   Temp: 99.5 F (37.5 C)   100.2 F (37.9 C)   TempSrc: Oral   Oral  SpO2: 98% 97% 97% 91%  Weight: 100.9 kg     Height: 5\' 9"  (1.753 m)      1.  General: Patient lying supine in bed,  no acute distress   2. Psychiatric: Alert and oriented x person and place, but not to context, mood is agitated and behavior is normal for situation, pleasant and cooperative with exam   3. Neurologic: Speech and language are normal, face is symmetric, moves all 4 extremities voluntarily, right dropfoot, at baseline without acute deficits on limited exam   4. HEENMT:  Head is atraumatic, normocephalic, pupils reactive to light, neck is supple, trachea is midline, mucous membranes are moist   5. Respiratory : Lungs are clear to auscultation bilaterally without wheezing, rhonchi, rales, no cyanosis, no increase in work of breathing or accessory muscle use   6. Cardiovascular : Heart rate normal, rhythm is regular, no murmurs, rubs or gallops, no peripheral edema, peripheral pulses palpated   7. Gastrointestinal:  Abdomen is soft, nondistended, tenderness without guarding in the periumbilical region, bowel sounds active, no masses or organomegaly palpated   8. Skin:  Skin is warm, dry and intact without rashes, acute lesions, or ulcers on limited exam   9.Musculoskeletal:  No acute deformities or trauma, no asymmetry in tone, no peripheral edema, peripheral pulses palpated, no tenderness to palpation in the extremities  Data Reviewed: In the ED Patient met sepsis criteria, source was determined to be UTI Blood cultures grew gram-negative rods in the anaerobic bottles Chemistry revealed a hyperglycemia she was started on sliding scale coverage Several times in the ED she was hypotensive, after 3 L of fluid she remained hypotensive and was given 1/4 L of fluid.  She was briefly excepted to the ICU at [facility], but then as patient woke up her blood pressure stabilized and she never actually had to start pressors Admission requested for  sepsis  Assessment and Plan: * Acute metabolic encephalopathy - Patient brought in unresponsive from [facility] - MRI is incomplete due  to motion, but had no definite evidence of acute intracranial abnormality - Patient maintaining oxygen sats on 2 L nasal cannula - Lithium level is low - She does have a UTI which is likely contributing - She also has polypharmacy with Ativan and large doses of opiates at baseline - Continue to monitor  UTI (urinary tract infection) - UA indicative of UTI - Blood cultures and urine culture pending - Previous urine culture grew multiple species and recommended recollection - Meets sepsis criteria - Continue Rocephin - Continue to monitor  Severe bipolar I disorder, most recent episode depressed (HCC) - Lithium level low - Continue lithium, continue Zyprexa, continue BuSpar, continue Ativan - Patient reportedly has been agitated in the ER and in the ICU - May end up needing extra medication as needed for agitation  Controlled type 2 diabetes mellitus with hyperglycemia (HCC) - Uncontrolled with hyperglycemia 426 at arrival - Hold metformin - Sliding scale coverage - Continue to monitor  Sepsis (HCC) - Fever at 102.7, tachycardia 146, tachypnea 33, lactic acidosis at 2.7, hypotension 86/58 - Patient received 3 L of fluid and then was still hypotensive - She then received another liter of fluid and had intermittent hypotension - Pressors were ordered but not started - Patient is COVID-negative - Chest x-ray is without acute findings - Urine is likely the source with the UA indicative of UTI - Final read on blood cultures and urine culture pending, but patient did grow gram-negative rods in both anaerobic bottles for her blood cultures - Continue Rocephin - Continue fluids - Monitor closely for hypotension - Continue midodrine  Hypokalemia - Potassium 3.4 - Replace and recheck - Continue to monitor  Hypotension - See plan for  sepsis - I believe there is a component of chronic soft pressures here, as she is on midodrine at home - Sepsis likely also contributing      Advance Care Planning:   Code Status: Full Code  Consults: None at this time  Family Communication: No family at bedside  Severity of Illness: The appropriate patient status for this patient is INPATIENT. Inpatient status is judged to be reasonable and necessary in order to provide the required intensity of service to ensure the patient's safety. The patient's presenting symptoms, physical exam findings, and initial radiographic and laboratory data in the context of their chronic comorbidities is felt to place them at high risk for further clinical deterioration. Furthermore, it is not anticipated that the patient will be medically stable for discharge from the hospital within 2 midnights of admission.   * I certify that at the point of admission it is my clinical judgment that the patient will require inpatient hospital care spanning beyond 2 midnights from the point of admission due to high intensity of service, high risk for further deterioration and high frequency of surveillance required.*  Author: Lilyan Gilford, DO 02/14/2023 5:13 AM  For on call review [email or web address].

## 2023-02-15 ENCOUNTER — Inpatient Hospital Stay (HOSPITAL_COMMUNITY): Payer: Medicare Other

## 2023-02-15 DIAGNOSIS — G9341 Metabolic encephalopathy: Secondary | ICD-10-CM | POA: Diagnosis not present

## 2023-02-15 LAB — BASIC METABOLIC PANEL
Anion gap: 7 (ref 5–15)
BUN: 7 mg/dL — ABNORMAL LOW (ref 8–23)
CO2: 24 mmol/L (ref 22–32)
Calcium: 8.3 mg/dL — ABNORMAL LOW (ref 8.9–10.3)
Chloride: 108 mmol/L (ref 98–111)
Creatinine, Ser: 0.48 mg/dL (ref 0.44–1.00)
GFR, Estimated: >60 mL/min (ref >60)
Glucose, Bld: 192 mg/dL — ABNORMAL HIGH (ref 70–99)
Potassium: 3.3 mmol/L — ABNORMAL LOW (ref 3.5–5.1)
Sodium: 139 mmol/L (ref 135–145)

## 2023-02-15 LAB — CBC
HCT: 36.6 % (ref 36.0–46.0)
Hemoglobin: 11.9 g/dL — ABNORMAL LOW (ref 12.0–15.0)
MCH: 30.4 pg (ref 26.0–34.0)
MCHC: 32.5 g/dL (ref 30.0–36.0)
MCV: 93.6 fL (ref 80.0–100.0)
Platelets: 105 10*3/uL — ABNORMAL LOW (ref 150–400)
RBC: 3.91 MIL/uL (ref 3.87–5.11)
RDW: 13.6 % (ref 11.5–15.5)
WBC: 6.2 10*3/uL (ref 4.0–10.5)
nRBC: 0 % (ref 0.0–0.2)

## 2023-02-15 LAB — GLUCOSE, CAPILLARY
Glucose-Capillary: 167 mg/dL — ABNORMAL HIGH (ref 70–99)
Glucose-Capillary: 168 mg/dL — ABNORMAL HIGH (ref 70–99)
Glucose-Capillary: 177 mg/dL — ABNORMAL HIGH (ref 70–99)
Glucose-Capillary: 187 mg/dL — ABNORMAL HIGH (ref 70–99)
Glucose-Capillary: 201 mg/dL — ABNORMAL HIGH (ref 70–99)
Glucose-Capillary: 242 mg/dL — ABNORMAL HIGH (ref 70–99)
Glucose-Capillary: 248 mg/dL — ABNORMAL HIGH (ref 70–99)
Glucose-Capillary: 264 mg/dL — ABNORMAL HIGH (ref 70–99)

## 2023-02-15 LAB — MAGNESIUM: Magnesium: 1.9 mg/dL (ref 1.7–2.4)

## 2023-02-15 LAB — T3, FREE: T3, Free: 3.5 pg/mL (ref 2.0–4.4)

## 2023-02-15 LAB — PHOSPHORUS: Phosphorus: 2.2 mg/dL — ABNORMAL LOW (ref 2.5–4.6)

## 2023-02-15 MED ORDER — POTASSIUM PHOSPHATES 15 MMOLE/5ML IV SOLN
15.0000 mmol | Freq: Once | INTRAVENOUS | Status: AC
  Administered 2023-02-15: 15 mmol via INTRAVENOUS
  Filled 2023-02-15: qty 5

## 2023-02-15 NOTE — Progress Notes (Addendum)
[facility] Progress Note  Patient: [name]    KGM:[id number]  DOA: 02/13/2023     Date of Service: the patient was seen and examined on 02/15/2023  Chief Complaint  Patient presents with   Altered Mental Status   Brief hospital course: CHARLEEN MADERA is a 67 y.o. female with medical history significant of bipolar disorder, depression, drug abuse, GERD, lung cancer s/p right upper lobectomy, diabetes mellitus type 2, on Eliquis for an unknown reason and patient is not able to elaborate, presents to ED with a chief complaint of altered mental status.  Patient reports she does not remember why she is here.  She says that she was told that she had convulsions.  Then she starts to explain to me that she is here because the staff at the nursing home put feces inside her.  This is clearly not why she is here, and she remains confused.  She reports she is in pain at the time of my exam.  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  She takes Percocet tens, 4 times per day at home.  I have advised her that that is too much opiate medication especially when she was already having altered mental status and is confused, so she will have reduced dose of opiate medication here.  Patient seems agitated about this but accepts it.  No further history could be obtained at this time.   Patient will remain full code by default as she has no DNR at bedside and she is too confused to have this conversation.   Assessment and Plan: Acute metabolic encephalopathy due to E. coli UTI - Patient brought in unresponsive from [facility] - MRI is incomplete due to motion, but had no definite evidence of acute intracranial abnormality - Patient maintaining oxygen sats on 2 L nasal cannula - Lithium level is low - She also has polypharmacy with Ativan and large doses of opiates at baseline - Continue to monitor -Mentation is back to baseline, patient is coherent -Sensitivity of E. coli from urine  pending   Sepsis due to UTI (urinary tract infection) ESBL E. coli bacteremia--POA - Fever at 102.7, tachycardia 146, tachypnea 33, lactic acidosis at 2.7, hypotension 86/58 -Blood and urine cultures from 02/13/2023 with E. coli sensitivities are pending -Continue meropenem for now pending sensitivities - - Chest x-ray is without acute findings Renal sonogram negative for any obstruction or fluid collection - s/p Midodrine 10 mg p.o. 3 times daily, BP elevated, changed midodrine as needed if SBP less than 100 mmhg  Hypokalemia/Hypophosphatemia/Hypomagnesemia-- -Multiple electrolyte derangement --Additional potassium and phosphorus given on 02/15/2023  Severe bipolar I disorder, most recent episode depressed (HCC) - Lithium level low - Continue lithium,  Zyprexa, BuSpar, and  Ativan -Mentation appears to be back to baseline -No significant behavioral concerns at this time  Controlled type 2 diabetes mellitus with hyperglycemia (HCC) - Uncontrolled with hyperglycemia 426 at arrival, hemoglobin A1c 10.9 - Hold metformin Use Novolog/Humalog Sliding scale insulin with Accu-Cheks/Fingersticks as ordered   Insomnia and restless leg syndrome Started trazodone 50 mg p.o. nightly and Requip 0.5 mg p.o. nightly  Hyperthyroidism 10/2 started methimazole 15 mg p.o. daily TSH 0.102 very low, free T4 level 1.43 Slightly elevated Follow-up free T4 level daily Follow free T3 level, pending Follow-up thyroid sonogram and thyroid antibodies Follow-up with endocrinologist as an outpatient for further workup and management  Right foot drop, chronic Patient will benefit from referral to orthopedic surgery as an outpatient  Left shoulder pain--- with range of motion -Left shoulder x-rays without acute findings patient does have some AC joint arthritis  Body mass index is 33.24 kg/m.  Interventions:  Diet: Carb modified and heart healthy diet  DVT Prophylaxis: Therapeutic Anticoagulation with  Eliquis    Advance goals of care discussion: Full code  Family Communication: family was not present at bedside, at the time of interview.    Disposition:  Pt is from [facility], admitted with AMS, found to have sepsis, ESBL E. coli bacteremia UTI, hypokalemia, electrolyte imbalance, still on IV antibiotics and has electrolyte imbalance, which precludes a safe discharge. - continue to improve and I anticipate discharge to SNF hopefully in 24 to 48 hours  Subjective:  -Resting  comfortably comfortably, complains of left shoulder pain with range of motion No fever  Or chills   Physical Exam: Gen:- Awake Alert, in no acute distress  HEENT:- Amo.AT, No sclera icterus Neck-Supple Neck,No JVD,.  Lungs-  CTAB , fair air movement bilaterally  CV- S1, S2 normal, RRR Abd-  +ve B.Sounds, Abd Soft, No tenderness,    Extremity/Skin:- +ve edema,   good pedal pulses , left shoulder discomfort with range of motion Psych-affect is appropriate, oriented x3 Neuro/Extre-Bil foot drop, generalized weakness, no new focal deficits, no tremors    Vitals:   02/15/23 1600 02/15/23 1643 02/15/23 1700 02/15/23 1754  BP: 107/61  113/72   Pulse: 96  (!) 107   Resp: (!) 21 20 20    Temp:  99 F (37.2 C)  99.1 F (37.3 C)  TempSrc:  Oral  Oral  SpO2: 97%  98%   Weight:      Height:        Intake/Output Summary (Last 24 hours) at 02/15/2023 1804 Last data filed at 02/15/2023 1234 Gross per 24 hour  Intake 1015.23 ml  Output 2800 ml  Net -1784.77 ml   Filed Weights   02/13/23 1443 02/14/23 0352 02/15/23 0500  Weight: 96.2 kg 100.9 kg 102.1 kg    CBC: Recent Labs  Lab 02/13/23 1504 02/14/23 0510 02/15/23 0319  WBC 9.9 12.0* 6.2  NEUTROABS 8.7* 10.0*  --   HGB 15.8* 12.8 11.9*  HCT 47.8* 40.0 36.6  MCV 90.9 92.6 93.6  PLT 145* 117* 105*   Basic Metabolic Panel: Recent Labs  Lab 02/13/23 1504 02/14/23 0510 02/14/23 0841 02/15/23 0319  NA 134* 138  --  139  K 3.4* 3.0*  --   3.3*  CL 96* 104  --  108  CO2 24 24  --  24  GLUCOSE 426* 221*  --  192*  BUN 10 9  --  7*  CREATININE 0.59 0.46  --  0.48  CALCIUM 9.3 8.5*  --  8.3*  MG  --  1.6*  --  1.9  PHOS  --   --  1.5* 2.2*    Studies: DG Shoulder Left  Result Date: 02/15/2023 CLINICAL DATA:  Shoulder pain injury EXAM: LEFT SHOULDER - 2+ VIEW COMPARISON:  None Available. FINDINGS: Moderate AC joint degenerative change. No obvious fracture or dislocation. IMPRESSION: Moderate AC joint degenerative change. Electronically Signed   By: Jasmine Pang M.D.   On: 02/15/2023 17:11   US THYROID  Result Date: 02/15/2023 CLINICAL DATA:  Hyperthyroid. EXAM: THYROID ULTRASOUND TECHNIQUE: Ultrasound examination of the thyroid gland and adjacent soft tissues was attempted. Very limited imaging obtained. Patient refused exam and asked the technologist to stop so that she could sleep. COMPARISON:  None Available. FINDINGS: Very limited evaluation of the right aspect of the thyroid gland. A nodule is evident measuring at least 3.0 x 2.5 cm. IMPRESSION: Extremely limited exam. Patient requested the sonographer to stops just after beginning the examination. Partially imaged 3 cm nodule in the region of the right mid thyroid. Recommend full thyroid ultrasound when the patient is able to cooperate. Electronically Signed   By: Malachy Moan M.D.   On: 02/15/2023 10:07    Scheduled Meds:  apixaban  5 mg Oral BID   busPIRone  5 mg Oral BID   Chlorhexidine Gluconate Cloth  6 each Topical Daily   gabapentin  300 mg Oral TID   insulin aspart  0-15 Units Subcutaneous Q4H   lithium carbonate  600 mg Oral Daily   LORazepam  1 mg Oral QHS   methIMAzole  15 mg Oral Daily   mupirocin ointment   Nasal BID   OLANZapine  10 mg Oral QHS   rOPINIRole  0.5 mg Oral QHS   traZODone  50 mg Oral QHS   Continuous Infusions:  sodium chloride Stopped (02/14/23 0127)   sodium chloride 75 mL/hr at 02/15/23 1753   meropenem (MERREM) IV 1 g  (02/15/23 1341)   PRN Meds: acetaminophen **OR** acetaminophen, hydrOXYzine, lactulose, midodrine, morphine injection, ondansetron **OR** ondansetron (ZOFRAN) IV, oxyCODONE  Author: Shon Hale, MD [facility] 02/15/2023 6:04 PM  To reach On-call, see care teams to locate the attending and reach out to them via [email or web address]. If 7PM-7AM, please contact night-coverage If you still have difficulty reaching the attending provider, please page the [facility] (Director on Call) for [facility] on amion for assistance.

## 2023-02-15 NOTE — Inpatient Diabetes Management (Signed)
Inpatient Diabetes Program Recommendations  AACE/ADA: New Consensus Statement on Inpatient Glycemic Control  Target Ranges:  Prepandial:   less than 140 mg/dL      Peak postprandial:   less than 180 mg/dL (1-2 hours)      Critically ill patients:  140 - 180 mg/dL    Latest Reference Range & Units 02/15/23 00:06 02/15/23 04:40 02/15/23 07:31 02/15/23 09:01  Glucose-Capillary 70 - 99 mg/dL 782 (H) 956 (H) 213 (H) 264 (H)    Latest Reference Range & Units 02/14/23 07:53 02/14/23 10:39 02/14/23 16:24 02/14/23 20:32 02/14/23 23:34  Glucose-Capillary 70 - 99 mg/dL 086 (H) 578 (H) 469 (H) 269 (H) 248 (H)    Latest Reference Range & Units 02/14/23 05:10  Hemoglobin A1C 4.8 - 5.6 % 10.9 (H)   Review of Glycemic Control  Diabetes history: DM2 Outpatient Diabetes medications: Metformin 500 mg QAM Current orders for Inpatient glycemic control: Novolog 0-15 units Q4H   Inpatient Diabetes Program Recommendations:     Insulin: Please consider ordering Semglee 10 units Q24H.    HbgA1C: A1C 10.9% on 02/14/23 indicating an average glucose 266 mg/dl over the past 2-3 months.   Outpatient DM: Patient will likely need additional DM medications prescribed at discharge.   Thanks, Orlando Penner, RN, MSN, CDCES Diabetes Coordinator Inpatient Diabetes Program [phone] (Team Pager from 8am to 5pm)

## 2023-02-15 NOTE — Progress Notes (Signed)
PHARMACY CONSULT NOTE FOR:  OUTPATIENT  PARENTERAL ANTIBIOTIC THERAPY (OPAT)  Indication: ESBL bacteremia Regimen: Ertapenem 1 gm every 24 hours  End date: 02/21/23  IV antibiotic discharge orders are pended. To discharging provider:  please sign these orders via discharge navigator,  Select New Orders & click on the button choice - Manage This Unsigned Work.     Thank you for allowing pharmacy to be a part of this patient's care.  Sharin Mons, PharmD, BCPS, BCIDP Infectious Diseases Clinical Pharmacist Phone: [phone] 02/15/2023, 1:19 PM

## 2023-02-15 NOTE — TOC Progression Note (Signed)
Transition of Care [facility]) - Progression Note    Patient Details  Name: [name] MRN: [id number] Date of Birth: Nov 15, 1955  [facility] CM/SW Contact  Erin Sons, [place] Phone Number: 02/15/2023, 4:09 PM  Clinical Narrative:     CSW spoke with [facility] and confirmed they can take pt with a midline and are okay with either Invanz or Merrem.  They do not do piggybacking. Tx team updated on this. Plan is for pt to get midline and DC tomorrow.    Expected Discharge Plan: Skilled Nursing Facility Barriers to Discharge: Continued Medical Work up  Expected Discharge Plan and Services In-house Referral: Clinical Social Work Discharge Planning Services: CM Consult Post Acute Care Choice: Skilled Nursing Facility Living arrangements for the past 2 months: Skilled Nursing Facility                                       Social Determinants of Health (SDOH) Interventions SDOH Screenings   Food Insecurity: No Food Insecurity (02/14/2023)  Housing: Low Risk  (02/14/2023)  Transportation Needs: No Transportation Needs (02/14/2023)  Utilities: Not At Risk (02/14/2023)  Alcohol Screen: Low Risk  (11/14/2017)  Social Connections: Unknown (07/05/2021)   Received from [facility], [facility]  Tobacco Use: Medium Risk (02/13/2023)    Readmission Risk Interventions    02/14/2023    9:57 AM 08/03/2021    1:25 PM  Readmission Risk Prevention Plan  Transportation Screening Complete Complete  PCP or Specialist Appt within 5-7 Days  Complete  Home Care Screening  Complete  Medication Review (RN CM)  Complete  Medication Review Oceanographer) Complete   HRI or Home Care Consult Complete   SW Recovery Care/Counseling Consult Complete   Palliative Care Screening Not Applicable   Skilled Nursing Facility Complete

## 2023-02-15 NOTE — Plan of Care (Signed)

## 2023-02-16 DIAGNOSIS — G9341 Metabolic encephalopathy: Secondary | ICD-10-CM | POA: Diagnosis not present

## 2023-02-16 LAB — CBC
HCT: 37.3 % (ref 36.0–46.0)
Hemoglobin: 11.6 g/dL — ABNORMAL LOW (ref 12.0–15.0)
MCH: 29.8 pg (ref 26.0–34.0)
MCHC: 31.1 g/dL (ref 30.0–36.0)
MCV: 95.9 fL (ref 80.0–100.0)
Platelets: 106 10*3/uL — ABNORMAL LOW (ref 150–400)
RBC: 3.89 MIL/uL (ref 3.87–5.11)
RDW: 13.6 % (ref 11.5–15.5)
WBC: 3.8 10*3/uL — ABNORMAL LOW (ref 4.0–10.5)
nRBC: 0 % (ref 0.0–0.2)

## 2023-02-16 LAB — CULTURE, BLOOD (ROUTINE X 2): Special Requests: ADEQUATE

## 2023-02-16 LAB — RENAL FUNCTION PANEL
Albumin: 2.4 g/dL — ABNORMAL LOW (ref 3.5–5.0)
Anion gap: 6 (ref 5–15)
BUN: 6 mg/dL — ABNORMAL LOW (ref 8–23)
CO2: 24 mmol/L (ref 22–32)
Calcium: 8.2 mg/dL — ABNORMAL LOW (ref 8.9–10.3)
Chloride: 108 mmol/L (ref 98–111)
Creatinine, Ser: 0.43 mg/dL — ABNORMAL LOW (ref 0.44–1.00)
GFR, Estimated: >60 mL/min (ref >60)
Glucose, Bld: 188 mg/dL — ABNORMAL HIGH (ref 70–99)
Phosphorus: 2.2 mg/dL — ABNORMAL LOW (ref 2.5–4.6)
Potassium: 3.6 mmol/L (ref 3.5–5.1)
Sodium: 138 mmol/L (ref 135–145)

## 2023-02-16 LAB — GLUCOSE, CAPILLARY
Glucose-Capillary: 175 mg/dL — ABNORMAL HIGH (ref 70–99)
Glucose-Capillary: 175 mg/dL — ABNORMAL HIGH (ref 70–99)
Glucose-Capillary: 199 mg/dL — ABNORMAL HIGH (ref 70–99)
Glucose-Capillary: 237 mg/dL — ABNORMAL HIGH (ref 70–99)

## 2023-02-16 LAB — MAGNESIUM: Magnesium: 2 mg/dL (ref 1.7–2.4)

## 2023-02-16 LAB — URINE CULTURE: Culture: 80000 — AB

## 2023-02-16 MED ORDER — ZOLPIDEM TARTRATE 5 MG PO TABS: 5.0000 mg | ORAL_TABLET | Freq: Every evening | ORAL | 0 refills | Status: DC | PRN

## 2023-02-16 MED ORDER — ALBUTEROL SULFATE (2.5 MG/3ML) 0.083% IN NEBU: 2.5000 mg | INHALATION_SOLUTION | RESPIRATORY_TRACT | Status: DC | PRN

## 2023-02-16 MED ORDER — ALBUTEROL SULFATE (2.5 MG/3ML) 0.083% IN NEBU
INHALATION_SOLUTION | RESPIRATORY_TRACT | Status: AC
  Administered 2023-02-16: 5 mg
  Filled 2023-02-16: qty 6

## 2023-02-16 MED ORDER — SODIUM CHLORIDE 0.9% FLUSH
10.0000 mL | INTRAVENOUS | Status: DC | PRN
  Administered 2023-02-16: 10 mL

## 2023-02-16 MED ORDER — METHIMAZOLE 5 MG PO TABS: 5.0000 mg | ORAL_TABLET | Freq: Two times a day (BID) | ORAL | 2 refills | Status: DC

## 2023-02-16 MED ORDER — ERTAPENEM IV (FOR PTA / DISCHARGE USE ONLY): 1.0000 g | INTRAVENOUS | 0 refills | Status: DC

## 2023-02-16 MED ORDER — ATENOLOL 25 MG PO TABS
25.0000 mg | ORAL_TABLET | Freq: Every day | ORAL | 3 refills | Status: DC
Start: 2023-02-16 — End: 2023-02-22

## 2023-02-16 MED ORDER — SODIUM CHLORIDE 0.9% FLUSH
10.0000 mL | Freq: Two times a day (BID) | INTRAVENOUS | Status: DC
  Administered 2023-02-16: 10 mL

## 2023-02-16 MED ORDER — OXYCODONE-ACETAMINOPHEN 10-325 MG PO TABS: 1.0000 | ORAL_TABLET | ORAL | 0 refills | Status: DC | PRN

## 2023-02-16 MED ORDER — MIDODRINE HCL 5 MG PO TABS: 5.0000 mg | ORAL_TABLET | Freq: Three times a day (TID) | ORAL | 3 refills | Status: DC

## 2023-02-16 MED ORDER — FIASP FLEXTOUCH 100 UNIT/ML ~~LOC~~ SOPN: 0.0000 [IU] | PEN_INJECTOR | Freq: Three times a day (TID) | SUBCUTANEOUS | 1 refills | Status: AC

## 2023-02-16 MED ORDER — LORAZEPAM 0.5 MG PO TABS: 0.5000 mg | ORAL_TABLET | Freq: Every evening | ORAL | 0 refills | Status: DC | PRN

## 2023-02-16 MED ORDER — HYDROCHLOROTHIAZIDE 25 MG PO TABS: 12.5000 mg | ORAL_TABLET | Freq: Every day | ORAL | 2 refills | Status: DC

## 2023-02-16 NOTE — TOC Transition Note (Signed)
Transition of Care [facility]) - CM/SW Discharge Note   Patient Details  Name: [name] MRN: [id number] Date of Birth: 1955-11-11  Transition of Care [facility]) CM/SW Contact:  Erin Sons, LCSW Phone Number: 02/16/2023, 12:26 PM   Clinical Narrative:      Per MD patient ready for DC to [facility]. RN, patient, patient's family, and facility notified of DC. Discharge Summary and FL2 sent to facility. RN to call report prior to discharge [phone]. Room A30-2). DC packet on chart. Ambulance transport requested for patient.   CSW will sign off for now as social work intervention is no longer needed. Please consult [place] again if new needs arise.   Final next level of care: Skilled Nursing Facility Barriers to Discharge: No Barriers Identified   Patient Goals and CMS Choice CMS Medicare.gov Compare Post Acute Care list provided to:: Patient Choice offered to / list presented to : Patient  Discharge Placement                Patient chooses bed at:  [facility]) Patient to be transferred to facility by: [facility] Name of family member notified: Arnell Sieving (Sister)  [phone] (Mobile) Patient and family notified of of transfer: 02/16/23  Discharge Plan and Services Additional resources added to the After Visit Summary for   In-house Referral: Clinical Social Work Discharge Planning Services: CM Consult Post Acute Care Choice: Skilled Nursing Facility                               Social Determinants of Health (SDOH) Interventions SDOH Screenings   Food Insecurity: No Food Insecurity (02/14/2023)  Housing: Low Risk  (02/14/2023)  Transportation Needs: No Transportation Needs (02/14/2023)  Utilities: Not At Risk (02/14/2023)  Alcohol Screen: Low Risk  (11/14/2017)  Social Connections: Unknown (07/05/2021)   Received from [facility], [facility]  Tobacco Use: Medium Risk (02/13/2023)     Readmission Risk Interventions    02/14/2023    9:57 AM  08/03/2021    1:25 PM  Readmission Risk Prevention Plan  Transportation Screening Complete Complete  PCP or Specialist Appt within 5-7 Days  Complete  Home Care Screening  Complete  Medication Review (RN CM)  Complete  Medication Review Oceanographer) Complete   HRI or Home Care Consult Complete   SW Recovery Care/Counseling Consult Complete   Palliative Care Screening Not Applicable   Skilled Nursing Facility Complete

## 2023-02-16 NOTE — Care Management Important Message (Signed)
Important Message  Patient Details  Name: [name] MRN: [id number] Date of Birth: 09-12-1955   Important Message Given:  N/A - LOS <3 / Initial given by admissions     Corey Harold 02/16/2023, 10:24 AM

## 2023-02-16 NOTE — Plan of Care (Signed)

## 2023-02-16 NOTE — Inpatient Diabetes Management (Addendum)
Inpatient Diabetes Program Recommendations  AACE/ADA: New Consensus Statement on Inpatient Glycemic Control   Target Ranges:  Prepandial:   less than 140 mg/dL      Peak postprandial:   less than 180 mg/dL (1-2 hours)      Critically ill patients:  140 - 180 mg/dL     Latest Reference Range & Units 02/16/23 00:24 02/16/23 03:23 02/16/23 07:56  Glucose-Capillary 70 - 99 mg/dL 161 (H) 096 (H) 045 (H)    Latest Reference Range & Units 02/15/23 07:31 02/15/23 09:01 02/15/23 11:55 02/15/23 16:15 02/15/23 19:35  Glucose-Capillary 70 - 99 mg/dL 409 (H) 811 (H) 914 (H) 187 (H) 168 (H)   Review of Glycemic Control  Diabetes history: DM2 Outpatient Diabetes medications: Metformin 500 mg QAM Current orders for Inpatient glycemic control: Novolog 0-15 units Q4H   Inpatient Diabetes Program Recommendations:     Insulin: Over the past 24 hours, CBGs have ranged from 167-264 and patient has received a total of Novolog 28 units for correction. Please consider ordering Semglee 10 units Q24H.    HbgA1C: A1C 10.9% on 02/14/23 indicating an average glucose 266 mg/dl over the past 2-3 months.   Outpatient DM: Patient will likely need additional DM medications prescribed at discharge.   Addendum 02/16/23@11 :15-Spoke with patient about diabetes and home regimen for diabetes control. Patient reports being followed by PA at [facility] for diabetes management. Patient states she use to go to [facility] and see Bunnie Pion, PA for primary care and she would like to get re-established with him. She states she last seen him about 1 1/2 years ago when she went to SNF.  Encouraged patient to call [facility] and see if she can get an appointment for follow up. Patient states that the SNF will provide transportation to appointments.  Patient states that the staff at the SNF check her CBGs twice a day and her glucose is usually high; has been over 500 mg/dl recently. Patient states that she only receives Metformin for DM at the SNF  and when it is in the 400-500's that nursing staff call the PA and they give her insulin to get it down at the time.  Discussed A1C results (10.9% on 02/14/23) and explained that current A1C indicates an average glucose of 266 mg/dl over the past 2-3 months. Discussed glucose and A1C goals. Discussed importance of checking CBGs and maintaining good CBG control to prevent long-term and short-term complications. Explained how hyperglycemia leads to damage within blood vessels which lead to the common complications seen with uncontrolled diabetes. Stressed to the patient the importance of improving glycemic control to prevent further complications from uncontrolled diabetes. Explained that she will need more than Metformin to get DM under better control. Asked patient to be sure to call [facility] and get an appointment to see Bunnie Pion, PA for follow up. Patient verbalized understanding of information discussed and reports no further questions at this time related to diabetes.  Thanks, Orlando Penner, RN, MSN, CDCES Diabetes Coordinator Inpatient Diabetes Program [phone] (Team Pager from 8am to 5pm)

## 2023-02-16 NOTE — Discharge Summary (Addendum)
[name], is a 67 y.o. female  DOB 07-13-1955  MRN [id number].  Admission date:  02/13/2023  Admitting Physician  Lilyan Gilford, DO  Discharge Date:  02/16/2023   Primary MD  Patient, [name] Per  Recommendations for primary care physician for things to follow:  1)Repeat TSH , free T 3, T4 blood tests in 6 weeks  2)Indication: ESBL Bacteremia -Antibiotic-- iv Invanz 1 GM daily via Midline/Picc Line First Dose: Yes Last Day of Therapy:  02/21/23  Labs - Once weekly:  CBC/D and BMP every Monday x 2 weeks starting 02/19/23, Labs - Once weekly: ESR and CRP every Monday x 2 weeks starting 02/19/23 3)Short acting insulin (Aspart) per sliding scale 0-10 ---:-- Insulin injection 0-10 Units 0-10 Units Subcutaneous, 3 times daily with meals CBG 70 - 120: 0 unit CBG 121 - 150: 0 unit  CBG 151 - 200: 1 unit CBG 201 - 250: 2 units CBG 251 - 300: 4 units CBG 301 - 350: 6 units  CBG 351 - 400: 8 units  CBG > 400: 10 units  Admission Diagnosis  Acute metabolic encephalopathy [G93.41] Septic shock (HCC) [A41.9, R65.21] Sepsis (HCC) [A41.9]   Discharge Diagnosis  Acute metabolic encephalopathy [G93.41] Septic shock (HCC) [A41.9, R65.21] Sepsis (HCC) [A41.9]    Principal Problem:   Acute metabolic encephalopathy Active Problems:   UTI (urinary tract infection)   Severe bipolar I disorder, most recent episode depressed (HCC)   Hypotension   Hypokalemia   Septic shock (HCC)   Sepsis (HCC)   Controlled type 2 diabetes mellitus with hyperglycemia (HCC)      Past Medical History:  Diagnosis Date   Allergy    Bipolar disorder (HCC)    Depression    Drug abuse (HCC)    history of, went to rehab 2015   GERD (gastroesophageal reflux disease)    Lung cancer (HCC)    lung ca dx 11/11- right upper lobe    Past Surgical History:  Procedure Laterality Date   ABDOMINAL AORTOGRAM W/LOWER EXTREMITY Bilateral  01/10/2022   Procedure: ABDOMINAL AORTOGRAM W/LOWER EXTREMITY;  Surgeon: Nada Libman, MD;  Location: [facility];  Service: Cardiovascular;  Laterality: Bilateral;   ANKLE FRACTURE SURGERY Left    HARDWARE REMOVAL Left 12/13/2021   Procedure: LEFT ANKLE HARDWARE REMOVAL;  Surgeon: Marcene Corning, MD;  Location: [facility];  Service: Orthopedics;  Laterality: Left;   LUNG LOBECTOMY Right 2011   RUL removed for lung cancer   STYLOID PROCESS EXCISION Left 10/16/2014   Procedure: EXCISION LEFT STYLOID PROCESS;  Surgeon: Drema Halon, MD;  Location: [facility];  Service: ENT;  Laterality: Left;   TONSILLECTOMY Bilateral 10/16/2014   Procedure: BILATERAL TONSILLECTOMY;  Surgeon: Drema Halon, MD;  Location: [facility];  Service: ENT;  Laterality: Bilateral;   TUBAL LIGATION  1984    HPI  from the history and physical done on the day of admission:  HPI: [name] is a 67 y.o. female with  medical history significant of bipolar disorder, depression, drug abuse, GERD, lung cancer, diabetes mellitus type 2, on Eliquis for an unknown reason and patient is not able to elaborate, presents to ED with a chief complaint of altered mental status.  Patient reports she does not remember why she is here.  She says that she was told that she had convulsions.  Then she starts to explain to me that she is here because the staff at the nursing home put feces inside her.  This is clearly not why she is here, and she remains confused.  She reports she is in pain at the time of my exam.  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  She takes Percocet tens, 4 times per day at home.  I have advised her that that is too much opiate medication especially when she was already having altered mental status and is confused, so she will have reduced dose of opiate medication here.  Patient seems agitated about this but accepts it.  No further history could be obtained at this time.    Patient will remain full code by default as she has no DNR at bedside and she is too confused to have this conversation. Review of Systems: unable to review all systems due to the inability of the patient to answer questions.   Hospital Course:   1)Acute metabolic encephalopathy due to E. coli UTI - Patient brought in unresponsive from [facility] - MRI is incomplete due to motion, but had no definite evidence of acute intracranial abnormality -No further hypoxia, weaned off oxygen completely - Lithium level was low - She also has polypharmacy with Ativan and large doses of opiates at baseline -Mentation is back to baseline,  -patient is coherent and appropriate -Blood and urine cultures from 02/13/2023 with ESBL E. coli -Treated with meropenem here in the hospital, okay to discharge on IV Invanz last dose 02/21/2023   2)Sepsis due to UTI (urinary tract infection) ESBL E. coli bacteremia--POA - Fever at 102.7, tachycardia 146, tachypnea 33, lactic acidosis at 2.7, hypotension 86/58 -Blood and urine cultures from 02/13/2023 with ESBL E. coli  - - Chest x-ray is without acute findings Renal sonogram negative for any obstruction or fluid collection -BP improved midodrine reduced to 5 mg 3 times daily --Treated with meropenem here in the hospital, okay to discharge on IV Invanz last dose 02/21/2023   3)Hypokalemia/Hypophosphatemia/Hypomagnesemia-- -Multiple electrolyte derangement -Replaced -Reduce HCTZ to 12.5 mg along with potassium supplementation   4)Severe bipolar I disorder, most recent episode depressed (HCC) - Lithium level low - Continue lithium,  Zyprexa, BuSpar, and  Ativan -Mentation is back to baseline -No significant behavioral concerns at this time   5)Controlled type 2 diabetes mellitus with hyperglycemia (HCC) - Uncontrolled with hyperglycemia 426 at arrival, hemoglobin A1c 10.9 -Okay to restart metformin -Use sliding scale Aspart Sliding scale insulin with  Accu-Cheks/Fingersticks as ordered    6)Hyperthyroidism TSH 0.102 very low, free T4 level 1.43 Slightly elevated -Free T3 is 3.5 -thyroid antibodies pending -Discharge on atenolol and methimazole -Repeat TSH T3 and T4 in about 6 weeks -Thyroid ultrasound study noted, repeat thyroid ultrasound will be required as outpatient as patient was not very cooperative with the ultrasound tech here Follow-up with endocrinologist as an outpatient for further workup and management   7)Bilateral foot drop, chronic-right more than left As per patient this began not for couple of years,  -Patient unable to ambulate, continue supportive care  8)Left shoulder pain--- with range  of motion -Left shoulder x-rays without acute findings patient does have some AC joint arthritis -Supportive and symptomatic care   9)Class I obesity- -Low calorie diet, portion control and increase physical activity discussed with patient -Body mass index is 33.24 kg/m.  10)Pancytopenia--- patient is on chronic anticoagulation with Eliquis as below #11 --Repeat CBC as above #1 -PCP at [facility] facility to clarify when Eliquis can be discontinued or reduced  11)H/o DVT--diagnosed with DVT at [facility] on 09/08/2022...Marland Kitchen - Currently on therapeutic dose of Eliquis -PCP at [facility] facility to decide on timing of discontinuation of Eliquis, or at least consider reducing dose to 2.5 mg twice daily prophylactically   Diet: Carb modified and heart healthy diet  DVT Prophylaxis: Therapeutic Anticoagulation with Eliquis     Advance goals of care discussion: Full code, SNF facility to readdress advanced directives with patient when her sister [name] or her son Marcial Pacas are present  ,  Family Communication: family was not present at bedside, at the time of interview.    Disposition: -Discharge back to [facility] SN[facility]  Discharge Condition: stable  Follow UP--- PCP at SNF facility   Consults obtained - ID  Team  Diet and Activity recommendation:  As advised  Discharge Instructions    Discharge Instructions     Call MD for:  difficulty breathing, headache or visual disturbances   Complete by: As directed    Call MD for:  persistant dizziness or light-headedness   Complete by: As directed    Call MD for:  persistant nausea and vomiting   Complete by: As directed    Call MD for:  temperature >100.4   Complete by: As directed    Change dressing on IV access line weekly and PRN   Complete by: As directed    Indication:  ESBL Bacteremia First Dose: Yes Last Day of Therapy:  02/21/23  Labs - Once weekly:  CBC/D and BMP, Labs - Once weekly: ESR and CRP   Diet - low sodium heart healthy   Complete by: As directed    Diet Carb Modified   Complete by: As directed    Discharge instructions   Complete by: As directed    1)Repeat TSH , free T 3, T4 blood tests in 6 weeks  2)Indication: ESBL Bacteremia -Antibiotic-- iv Invanz 1 GM daily via Midline/Picc Line First Dose: Yes Last Day of Therapy:  02/21/23  Labs - Once weekly:  CBC/D and BMP every Monday x 2 weeks starting 02/19/23, Labs - Once weekly: ESR and CRP every Monday x 2 weeks starting 02/19/23 3)Short acting insulin (Aspart) per sliding scale 0-10 ---:-- Insulin injection 0-10 Units 0-10 Units Subcutaneous, 3 times daily with meals CBG 70 - 120: 0 unit CBG 121 - 150: 0 unit  CBG 151 - 200: 1 unit CBG 201 - 250: 2 units CBG 251 - 300: 4 units CBG 301 - 350: 6 units  CBG 351 - 400: 8 units  CBG > 400: 10 units   Increase activity slowly   Complete by: As directed        Discharge Medications     Allergies as of 02/16/2023       Reactions   Sulfa Antibiotics Swelling, Rash   Neck swelling        Medication List     STOP taking these medications    cetirizine 10 MG tablet Commonly known as: ZYRTEC   docusate sodium 100 MG capsule Commonly known as: COLACE  guaiFENesin 100 MG/5ML liquid Commonly known as:  ROBITUSSIN   SENNOSIDES PO       TAKE these medications    acetaminophen 325 MG tablet Commonly known as: TYLENOL Take 650 mg by mouth every 4 (four) hours as needed for mild pain.   ascorbic acid 500 MG tablet Commonly known as: VITAMIN C Take 500 mg by mouth 2 (two) times daily.   atenolol 25 MG tablet Commonly known as: Tenormin Take 1 tablet (25 mg total) by mouth daily.   busPIRone 5 MG tablet Commonly known as: BUSPAR Take 5 mg by mouth 2 (two) times daily.   calcium carbonate 500 MG chewable tablet Commonly known as: TUMS - dosed in mg elemental calcium Chew 1 tablet by mouth every 5 (five) hours as needed for indigestion or heartburn.   Eliquis 5 MG Tabs tablet Generic drug: apixaban Take 5 mg by mouth 2 (two) times daily.   ertapenem IVPB Commonly known as: INVANZ Inject 1 g into the vein daily for 6 days. Indication:  ESBL Bacteremia First Dose: Yes Last Day of Therapy:  02/21/23  Labs - Once weekly:  CBC/D and BMP, Labs - Once weekly: ESR and CRP   Fiasp FlexTouch 100 UNIT/ML FlexTouch Pen Generic drug: insulin aspart Inject 0-10 Units into the skin 4 (four) times daily -  before meals and at bedtime. Short acting insulin (Aspart) per sliding scale 0-10 ---:-- Insulin injection 0-10 Units 0-10 Units Subcutaneous, 3 times daily with meals CBG 70 - 120: 0 unit CBG 121 - 150: 0 unit  CBG 151 - 200: 1 unit CBG 201 - 250: 2 units CBG 251 - 300: 4 units CBG 301 - 350: 6 units  CBG 351 - 400: 8 units  CBG > 400: 10 units   folic acid 1 MG tablet Commonly known as: FOLVITE Take 1 mg by mouth daily.   gabapentin 100 MG capsule Commonly known as: NEURONTIN Take 300 mg by mouth 3 (three) times daily.   hydrochlorothiazide 25 MG tablet Commonly known as: HYDRODIURIL Take 0.5 tablets (12.5 mg total) by mouth daily. What changed: how much to take   hydrOXYzine 25 MG tablet Commonly known as: ATARAX Take 1 tablet (25 mg total) by mouth every 6 (six) hours as  needed for itching.   Ketoconazole 2 % Foam Apply topically.   lactulose 10 GM/15ML solution Commonly known as: CHRONULAC Take 30 mLs by mouth daily as needed for moderate constipation.   lidocaine 5 % Commonly known as: LIDODERM Place 1 patch onto the skin daily. Remove & Discard patch within 12 hours or as directed by MD; Apply to LOWER LEG   lithium carbonate 300 MG capsule Take 600 mg by mouth daily.   loperamide 2 MG tablet Commonly known as: IMODIUM A-D Take 2 mg by mouth every 6 (six) hours as needed for diarrhea or loose stools.   LORazepam 0.5 MG tablet Commonly known as: ATIVAN Take 1 tablet (0.5 mg total) by mouth at bedtime as needed for anxiety or sleep. What changed:  medication strength how much to take when to take this reasons to take this   Melatonin 10 MG Tabs Take 10 mg by mouth at bedtime.   metFORMIN 500 MG tablet Commonly known as: GLUCOPHAGE Take 1 pill a day What changed:  how much to take how to take this when to take this   methimazole 5 MG tablet Commonly known as: TAPAZOLE Take 1 tablet (5 mg total) by mouth 2 (two)  times daily.   midodrine 5 MG tablet Commonly known as: PROAMATINE Take 1 tablet (5 mg total) by mouth 3 (three) times daily with meals. What changed:  medication strength how much to take   MULTIVITAMIN ADULT PO Take 1 tablet by mouth daily.   OLANZapine 10 MG tablet Commonly known as: ZYPREXA Take 10 mg by mouth at bedtime.   ondansetron 4 MG tablet Commonly known as: ZOFRAN Take 4 mg by mouth every 8 (eight) hours as needed.   oxyCODONE-acetaminophen 10-325 MG tablet Commonly known as: PERCOCET Take 1 tablet by mouth every 4 (four) hours as needed.   potassium chloride 20 MEQ/15ML (10%) Soln Take 10 mEq by mouth daily.   saccharomyces boulardii 250 MG capsule Commonly known as: FLORASTOR Take 250 mg by mouth daily.   Selsun Blue Itchy Dry Scalp 1 % shampoo Generic drug: pyrithione zinc Apply  topically daily as needed for itching.   senna-docusate 8.6-50 MG tablet Commonly known as: Senokot-S Take 2 tablets by mouth 2 (two) times daily. May hold for loose stools   tamsulosin 0.4 MG Caps capsule Commonly known as: FLOMAX Take 0.4 mg by mouth daily.   zolpidem 5 MG tablet Commonly known as: AMBIEN Take 1 tablet (5 mg total) by mouth at bedtime as needed for sleep. What changed: reasons to take this               Discharge Care Instructions  (From admission, onward)           Start     Ordered   02/16/23 0000  Change dressing on IV access line weekly and PRN  (Home infusion instructions - [facility] )       Comments: Indication:  ESBL Bacteremia First Dose: Yes Last Day of Therapy:  02/21/23  Labs - Once weekly:  CBC/D and BMP, Labs - Once weekly: ESR and CRP   02/16/23 1022           Major procedures and Radiology Reports - PLEASE review detailed and final reports for all details, in brief -   DG Shoulder Left  Result Date: 02/15/2023 CLINICAL DATA:  Shoulder pain injury EXAM: LEFT SHOULDER - 2+ VIEW COMPARISON:  None Available. FINDINGS: Moderate AC joint degenerative change. No obvious fracture or dislocation. IMPRESSION: Moderate AC joint degenerative change. Electronically Signed   By: Jasmine Pang M.D.   On: 02/15/2023 17:11   US THYROID  Result Date: 02/15/2023 CLINICAL DATA:  Hyperthyroid. EXAM: THYROID ULTRASOUND TECHNIQUE: Ultrasound examination of the thyroid gland and adjacent soft tissues was attempted. Very limited imaging obtained. Patient refused exam and asked the technologist to stop so that she could sleep. COMPARISON:  None Available. FINDINGS: Very limited evaluation of the right aspect of the thyroid gland. A nodule is evident measuring at least 3.0 x 2.5 cm. IMPRESSION: Extremely limited exam. Patient requested the sonographer to stops just after beginning the examination. Partially imaged 3 cm nodule in the region of the  right mid thyroid. Recommend full thyroid ultrasound when the patient is able to cooperate. Electronically Signed   By: Malachy Moan M.D.   On: 02/15/2023 10:07   US RENAL  Result Date: 02/14/2023 CLINICAL DATA:  Acute kidney injury. EXAM: RENAL / URINARY TRACT ULTRASOUND COMPLETE COMPARISON:  CT of the abdomen and pelvis 01/24/2023 FINDINGS: Right Kidney: Renal measurements: 13.4 x 5.0 x 7.4 cm = volume: 259.9 mL. Echogenicity within normal limits. No mass or hydronephrosis visualized. Left Kidney: Renal measurements: 14.8 x  6.6 x 6.9 cm = volume: 353.2 mL. Echogenicity within normal limits. No mass or hydronephrosis visualized. Bladder: Appears normal for degree of bladder distention. Other: A 9 mm shadowing stone is incidentally noted within a contracted gallbladder. IMPRESSION: Normal renal ultrasound. Electronically Signed   By: Marin Roberts M.D.   On: 02/14/2023 13:46   MR Brain Wo Contrast (neuro protocol)  Result Date: 02/13/2023 CLINICAL DATA:  Altered mental status EXAM: MRI HEAD WITHOUT CONTRAST TECHNIQUE: Multiplanar, multiecho pulse sequences of the brain and surrounding structures were obtained without intravenous contrast. COMPARISON:  CT head 08/17/2021 FINDINGS: The study was not completed. Sagittal T1, axial and coronal DWI, and axial T2 images were obtained. The sagittal T1 sequence is essentially nondiagnostic to motion artifact due. Brain: There is no evidence of acute infarct on the diffusion-weighted images. There is no definite acute intracranial hemorrhage on the provided sequences. Parenchymal volume is normal. The ventricles are normal in size. Parenchymal signal is grossly unremarkable on the provided sequences. There is no definite mass lesion.  There is no midline shift. Vascular: Normal flow voids. Skull and upper cervical spine: Not well assessed on the provided sequences. Sinuses/Orbits: The paranasal sinuses are clear. The globes and orbits are grossly  unremarkable. Other: There is possible fluid in the right mastoid air cells. IMPRESSION: Incomplete study with limited sequences obtained as above. No definite evidence of acute intracranial pathology on the provided sequences, which do include diagnostic quality DWI sequences. Standard repeat MRI when the patient can tolerate as indicated. Electronically Signed   By: Lesia Hausen M.D.   On: 02/13/2023 21:56   DG Chest Port 1 View  Result Date: 02/13/2023 CLINICAL DATA:  Questionable sepsis - evaluate for abnormality. Decreased lung sounds. Diaphoretic. EXAM: PORTABLE CHEST 1 VIEW COMPARISON:  01/24/2023. FINDINGS: Redemonstration of linear area of scarring/atelectasis at the left lung base. Bilateral lung fields are otherwise clear. No acute consolidation or lung collapse. Bilateral lateral costophrenic angles are clear. Stable cardio-mediastinal silhouette. No acute osseous abnormalities. The soft tissues are within normal limits. IMPRESSION: No acute cardiopulmonary process. Electronically Signed   By: Jules Schick M.D.   On: 02/13/2023 16:36   CT ABDOMEN PELVIS W CONTRAST  Result Date: 01/24/2023 CLINICAL DATA:  Lower abdominal pain with diarrhea and constipation. Suprapubic/flank pain with dysuria. EXAM: CT ABDOMEN AND PELVIS WITH CONTRAST TECHNIQUE: Multidetector CT imaging of the abdomen and pelvis was performed using the standard protocol following bolus administration of intravenous contrast. RADIATION DOSE REDUCTION: This exam was performed according to the departmental dose-optimization program which includes automated exposure control, adjustment of the mA and/or kV according to patient size and/or use of iterative reconstruction technique. CONTRAST:  OMNIPAQUE IOHEXOL 300 MG/ML  SOLN COMPARISON:  09/05/2013, 09/01/2011. FINDINGS: Lower chest: Strandy atelectasis is present at the left lung base. Hepatobiliary: No focal liver abnormality is seen. Fatty infiltration of the liver is noted.  No gallstones, gallbladder wall thickening, or biliary dilatation. Pancreas: Pancreatic atrophy is noted. No pancreatic ductal dilatation or surrounding inflammatory changes. Spleen: Normal in size without focal abnormality. Adrenals/Urinary Tract: The adrenal glands are within normal limits. A stable complex lesion is noted along the medial aspect of the left kidney measuring 2.7 cm. Subcentimeter hypodensity is noted in the left kidney which is too small to further characterize. The kidneys enhance symmetrically. Nonobstructive renal calculi are noted on the left. There is no obstructive uropathy. The bladder is unremarkable. Stomach/Bowel: There is a small hiatal hernia. Stomach is within normal limits. Appendix  appears normal. No bowel obstruction, free air or pneumatosis. A moderate amount of retained stool is present in the colon and rectum. No acute inflammatory changes are seen. There is inferior displacement of the rectum below the pubococcygeal line suggesting pelvic floor dysfunction. Vascular/Lymphatic: Aortic atherosclerosis. No enlarged abdominal or pelvic lymph nodes. Reproductive: Uterus and bilateral adnexa are unremarkable. Other: No abdominopelvic ascites. A fat containing umbilical hernia is noted. Musculoskeletal: A lipoma is noted in the abductor muscles at the left hip. Degenerative changes are present in the thoracolumbar spine. No acute osseous abnormality is seen. IMPRESSION: 1. Moderate amount of retained stool in the colon and rectum, compatible with history of constipation. There is inferior subluxation of the rectum below the pubococcygeal line, compatible with pelvic floor dysfunction which may also be associated with constipation. 2. Hepatic steatosis. 3. Complex lesion in the mid left kidney which is unchanged from 2013 with history of previous biopsy proven angiomyolipoma. 4. Nonobstructive left renal calculi. 5. Small hiatal hernia. 6. Aortic atherosclerosis. Electronically Signed    By: Thornell Sartorius M.D.   On: 01/24/2023 21:51   DG Chest 2 View  Result Date: 01/24/2023 CLINICAL DATA:  Cough. EXAM: CHEST - 2 VIEW COMPARISON:  December 28, 2021. FINDINGS: The heart size and mediastinal contours are within normal limits. Mild left basilar subsegmental atelectasis. Right lung is clear. The visualized skeletal structures are unremarkable. IMPRESSION: Mild left basilar subsegmental atelectasis. Electronically Signed   By: Lupita Raider M.D.   On: 01/24/2023 20:06    Micro Results  Recent Results (from the past 240 hour(s))  Resp panel by RT-PCR (RSV, Flu A&B, Covid) Anterior Nasal Swab     Status: None   Collection Time: 02/13/23  2:56 PM   Specimen: Anterior Nasal Swab  Result Value Ref Range Status   SARS Coronavirus 2 by RT PCR NEGATIVE NEGATIVE Final    Comment: (NOTE) SARS-CoV-2 target nucleic acids are NOT DETECTED.  The SARS-CoV-2 RNA is generally detectable in upper respiratory specimens during the acute phase of infection. The lowest concentration of SARS-CoV-2 viral copies this assay can detect is 138 copies/mL. A negative result does not preclude SARS-Cov-2 infection and should not be used as the sole basis for treatment or other patient management decisions. A negative result may occur with  improper specimen collection/handling, submission of specimen other than nasopharyngeal swab, presence of viral mutation(s) within the areas targeted by this assay, and inadequate number of viral copies(<138 copies/mL). A negative result must be combined with clinical observations, patient history, and epidemiological information. The expected result is Negative.  Fact Sheet for Patients:  BloggerCourse.com  Fact Sheet for Healthcare Providers:  SeriousBroker.it  This test is no t yet approved or cleared by the [place] FDA and  has been authorized for detection and/or diagnosis of SARS-CoV-2 by FDA under an  Emergency Use Authorization (EUA). This EUA will remain  in effect (meaning this test can be used) for the duration of the COVID-19 declaration under Section 564(b)(1) of the Act, 21 U.S.C.section 360bbb-3(b)(1), unless the authorization is terminated  or revoked sooner.       Influenza A by PCR NEGATIVE NEGATIVE Final   Influenza B by PCR NEGATIVE NEGATIVE Final    Comment: (NOTE) The Xpert Xpress SARS-CoV-2/FLU/RSV plus assay is intended as an aid in the diagnosis of influenza from Nasopharyngeal swab specimens and should not be used as a sole basis for treatment. Nasal washings and aspirates are unacceptable for Xpert Xpress SARS-CoV-2/FLU/RSV testing.  Fact Sheet for Patients: BloggerCourse.com  Fact Sheet for Healthcare Providers: SeriousBroker.it  This test is not yet approved or cleared by the [place] FDA and has been authorized for detection and/or diagnosis of SARS-CoV-2 by FDA under an Emergency Use Authorization (EUA). This EUA will remain in effect (meaning this test can be used) for the duration of the COVID-19 declaration under Section 564(b)(1) of the Act, 21 U.S.C. section 360bbb-3(b)(1), unless the authorization is terminated or revoked.     Resp Syncytial Virus by PCR NEGATIVE NEGATIVE Final    Comment: (NOTE) Fact Sheet for Patients: BloggerCourse.com  Fact Sheet for Healthcare Providers: SeriousBroker.it  This test is not yet approved or cleared by the [place] FDA and has been authorized for detection and/or diagnosis of SARS-CoV-2 by FDA under an Emergency Use Authorization (EUA). This EUA will remain in effect (meaning this test can be used) for the duration of the COVID-19 declaration under Section 564(b)(1) of the Act, 21 U.S.C. section 360bbb-3(b)(1), unless the authorization is terminated or revoked.  Performed at [facility],  [address] [place], [place] [address]   Blood Culture (routine x 2)     Status: Abnormal   Collection Time: 02/13/23  3:04 PM   Specimen: BLOOD  Result Value Ref Range Status   Specimen Description   Final    BLOOD BLOOD RIGHT ARM Performed at [facility], [address], [place], [place] [address]    Special Requests   Final    BOTTLES DRAWN AEROBIC AND ANAEROBIC Blood Culture results may not be optimal due to an excessive volume of blood received in culture bottles Performed at [facility], [address] [place], [place] [address]    Culture  Setup Time   Final    GRAM NEGATIVE RODS IN BOTH AEROBIC AND ANAEROBIC BOTTLES CRITICAL RESULT CALLED TO, READ BACK BY AND VERIFIED WITH: CODY KINDLEY [phone] BY VIRAY, J CRITICAL RESULT CALLED TO, READ BACK BY AND VERIFIED WITH: PHARMD L.POOLE AT 1033 ON 02/14/2023 BY T.SAAD. Performed at [facility], [address] [place], [place] [address]    Culture (A)  Final    ESCHERICHIA COLI Confirmed Extended Spectrum Beta-Lactamase Producer (ESBL).  In bloodstream infections from ESBL organisms, carbapenems are preferred over piperacillin/tazobactam. They are shown to have a lower risk of mortality.    Report Status 02/16/2023 FINAL  Final   Organism ID, Bacteria ESCHERICHIA COLI  Final      Susceptibility   Escherichia coli - MIC*    AMPICILLIN >=32 RESISTANT Resistant     CEFEPIME 16 RESISTANT Resistant     CEFTAZIDIME RESISTANT Resistant     CEFTRIAXONE >=64 RESISTANT Resistant     CIPROFLOXACIN >=4 RESISTANT Resistant     GENTAMICIN <=1 SENSITIVE Sensitive     IMIPENEM <=0.25 SENSITIVE Sensitive     TRIMETH/SULFA <=20 SENSITIVE Sensitive     AMPICILLIN/SULBACTAM >=32 RESISTANT Resistant     PIP/TAZO 8 SENSITIVE Sensitive     * ESCHERICHIA COLI  Blood Culture ID Panel (Reflexed)     Status: Abnormal   Collection Time: 02/13/23  3:04 PM  Result Value Ref Range Status   Enterococcus faecalis NOT DETECTED NOT DETECTED Final    Enterococcus Faecium NOT DETECTED NOT DETECTED Final   Listeria monocytogenes NOT DETECTED NOT DETECTED Final   Staphylococcus species NOT DETECTED NOT DETECTED Final   Staphylococcus aureus (BCID) NOT DETECTED NOT DETECTED Final   Staphylococcus epidermidis NOT DETECTED NOT DETECTED Final  Staphylococcus lugdunensis NOT DETECTED NOT DETECTED Final   Streptococcus species NOT DETECTED NOT DETECTED Final   Streptococcus agalactiae NOT DETECTED NOT DETECTED Final   Streptococcus pneumoniae NOT DETECTED NOT DETECTED Final   Streptococcus pyogenes NOT DETECTED NOT DETECTED Final   A.calcoaceticus-baumannii NOT DETECTED NOT DETECTED Final   Bacteroides fragilis NOT DETECTED NOT DETECTED Final   Enterobacterales DETECTED (A) NOT DETECTED Final    Comment: Enterobacterales represent a large order of gram negative bacteria, not a single organism. CRITICAL RESULT CALLED TO, READ BACK BY AND VERIFIED WITH: PHARMD L.POOLE AT 1033 ON 02/14/2023 BY T.SAAD.    Enterobacter cloacae complex NOT DETECTED NOT DETECTED Final   Escherichia coli DETECTED (A) NOT DETECTED Final    Comment: CRITICAL RESULT CALLED TO, READ BACK BY AND VERIFIED WITH: PHARMD L.POOLE AT 1033 ON 02/14/2023 BY T.SAAD.    Klebsiella aerogenes NOT DETECTED NOT DETECTED Final   Klebsiella oxytoca NOT DETECTED NOT DETECTED Final   Klebsiella pneumoniae NOT DETECTED NOT DETECTED Final   Proteus species NOT DETECTED NOT DETECTED Final   Salmonella species NOT DETECTED NOT DETECTED Final   Serratia marcescens NOT DETECTED NOT DETECTED Final   Haemophilus influenzae NOT DETECTED NOT DETECTED Final   Neisseria meningitidis NOT DETECTED NOT DETECTED Final   Pseudomonas aeruginosa NOT DETECTED NOT DETECTED Final   Stenotrophomonas maltophilia NOT DETECTED NOT DETECTED Final   Candida albicans NOT DETECTED NOT DETECTED Final   Candida auris NOT DETECTED NOT DETECTED Final   Candida glabrata NOT DETECTED NOT DETECTED Final   Candida  krusei NOT DETECTED NOT DETECTED Final   Candida parapsilosis NOT DETECTED NOT DETECTED Final   Candida tropicalis NOT DETECTED NOT DETECTED Final   Cryptococcus neoformans/gattii NOT DETECTED NOT DETECTED Final   CTX-M ESBL DETECTED (A) NOT DETECTED Final    Comment: CRITICAL RESULT CALLED TO, READ BACK BY AND VERIFIED WITH: PHARMD L.POOLE AT 1033 ON 02/14/2023 BY T.SAAD. (NOTE) Extended spectrum beta-lactamase detected. Recommend a carbapenem as initial therapy.      Carbapenem resistance IMP NOT DETECTED NOT DETECTED Final   Carbapenem resistance KPC NOT DETECTED NOT DETECTED Final   Carbapenem resistance NDM NOT DETECTED NOT DETECTED Final   Carbapenem resist OXA 48 LIKE NOT DETECTED NOT DETECTED Final   Carbapenem resistance VIM NOT DETECTED NOT DETECTED Final    Comment: Performed at [facility], [address] [place], [place] [address]  Blood Culture (routine x 2)     Status: Abnormal   Collection Time: 02/13/23  3:16 PM   Specimen: BLOOD  Result Value Ref Range Status   Specimen Description   Final    BLOOD RIGHT ASSIST CONTROL Performed at [facility], [address], [place], [place] [address]    Special Requests   Final    BOTTLES DRAWN AEROBIC AND ANAEROBIC Blood Culture adequate volume Performed at [facility], [address] [place], [place] [address]    Culture  Setup Time   Final    GRAM NEGATIVE RODS IN BOTH AEROBIC AND ANAEROBIC BOTTLES CRITICAL RESULT CALLED TO, READ BACK BY AND VERIFIED WITH: CODY KINDLEY [id number] BY VIRAY, J CRITICAL VALUE NOTED.  VALUE IS CONSISTENT WITH PREVIOUSLY REPORTED AND CALLED VALUE.    Culture (A)  Final    ESCHERICHIA COLI SUSCEPTIBILITIES PERFORMED ON PREVIOUS CULTURE WITHIN THE LAST 5 DAYS. Performed at [facility], [address], [place], [place] [address]    Report Status 02/16/2023 FINAL  Final  Urine Culture  Status: Abnormal   Collection Time: 02/13/23  5:25 PM   Specimen: Urine, Random   Result Value Ref Range Status   Specimen Description   Final    URINE, RANDOM Performed at [facility], [address] [place], [place] [address]    Special Requests   Final    NONE Reflexed from [id number] Performed at [facility], [address] [place], [place] [address]    Culture (A)  Final    80,000 COLONIES/mL ESCHERICHIA COLI Confirmed Extended Spectrum Beta-Lactamase Producer (ESBL).  In bloodstream infections from ESBL organisms, carbapenems are preferred over piperacillin/tazobactam. They are shown to have a lower risk of mortality.    Report Status 02/16/2023 FINAL  Final   Organism ID, Bacteria ESCHERICHIA COLI (A)  Final      Susceptibility   Escherichia coli - MIC*    AMPICILLIN >=32 RESISTANT Resistant     CEFAZOLIN >=64 RESISTANT Resistant     CEFEPIME >=32 RESISTANT Resistant     CEFTRIAXONE >=64 RESISTANT Resistant     CIPROFLOXACIN >=4 RESISTANT Resistant     GENTAMICIN <=1 SENSITIVE Sensitive     IMIPENEM <=0.25 SENSITIVE Sensitive     NITROFURANTOIN <=16 SENSITIVE Sensitive     TRIMETH/SULFA <=20 SENSITIVE Sensitive     AMPICILLIN/SULBACTAM >=32 RESISTANT Resistant     PIP/TAZO 8 SENSITIVE Sensitive     * 80,000 COLONIES/mL ESCHERICHIA COLI  MRSA Next Gen by PCR, Nasal     Status: Abnormal   Collection Time: 02/14/23 10:20 AM   Specimen: Nasal Mucosa; Nasal Swab  Result Value Ref Range Status   MRSA by PCR Next Gen DETECTED (A) NOT DETECTED Final    Comment: RESULT CALLED TO, READ BACK BY AND VERIFIED WITH: MCBRIDE,CASSIDY AT 1252 ON 02/14/23 BY FRATTO,ASHLEY        The GeneXpert MRSA Assay (FDA approved for NASAL specimens only), is one component of a comprehensive MRSA colonization surveillance program. It is not intended to diagnose MRSA infection nor to guide or monitor treatment for MRSA infections. Performed at [facility], [address] [place], [place] [address]    Today   Subjective    [name] today has no new concerns  No  Nausea, Vomiting or Diarrhea -No bleeding concerns -Cooperative and appropriate         Patient has been seen and examined prior to discharge   Objective   Blood pressure 112/66, pulse 94, temperature 98.3 F (36.8 C), temperature source Oral, resp. rate 20, height 5\' 9"  (1.753 m), weight 102.1 kg, SpO2 96%.   Intake/Output Summary (Last 24 hours) at 02/16/2023 1323 Last data filed at 02/16/2023 1303 Gross per 24 hour  Intake 1668.82 ml  Output 3050 ml  Net -1381.18 ml   Exam Gen:- Awake Alert, in no acute distress  HEENT:- [place].AT, No sclera icterus Neck-Supple Neck,No JVD,.  Lungs-  CTAB , fair air movement bilaterally  CV- S1, S2 normal, RRR Abd-  +ve B.Sounds, Abd Soft, No tenderness,    Extremity/Skin:-Chronic +ve edema with chronic foot drop,   good pedal pulses , much improved left shoulder discomfort with range of motion Psych-affect is appropriate, oriented x3 Neuro/Extre-Bil foot drop, generalized weakness, no new focal deficits, no tremors MSK--Rt Arm Midline in situ   Data Review   CBC w Diff:  Lab Results  Component Value Date   WBC 3.8 (L) 02/16/2023   HGB 11.6 (L) 02/16/2023   HGB 14.1 11/13/2014   HCT 37.3 02/16/2023   HCT 41.1  11/13/2014   PLT 106 (L) 02/16/2023   PLT 196 11/13/2014   LYMPHOPCT 7 02/14/2023   LYMPHOPCT 32.7 11/13/2014   MONOPCT 8 02/14/2023   MONOPCT 10.2 11/13/2014   EOSPCT 0 02/14/2023   EOSPCT 2.2 11/13/2014   BASOPCT 0 02/14/2023   BASOPCT 0.5 11/13/2014   CMP:  Lab Results  Component Value Date   NA 138 02/16/2023   NA 143 11/13/2014   K 3.6 02/16/2023   K 3.3 (L) 11/13/2014   CL 108 02/16/2023   CL 101 09/01/2011   CO2 24 02/16/2023   CO2 27 11/13/2014   BUN 6 (L) 02/16/2023   BUN 10.0 11/13/2014   CREATININE 0.43 (L) 02/16/2023   CREATININE 0.7 11/13/2014   PROT 5.8 (L) 02/14/2023   PROT 6.2 (L) 11/13/2014   ALBUMIN 2.4 (L) 02/16/2023   ALBUMIN 3.4 (L) 11/13/2014   BILITOT 0.7 02/14/2023   BILITOT 0.32  11/13/2014   ALKPHOS 72 02/14/2023   ALKPHOS 90 11/13/2014   AST 15 02/14/2023   AST 13 11/13/2014   ALT 22 02/14/2023   ALT 18 11/13/2014   Total Discharge time is about 33 minutes  Shon Hale M.D on 02/16/2023 at 1:24 PM  Go to [email or web address] -  for contact info  [facility] - Office  [phone]

## 2023-02-16 NOTE — Progress Notes (Signed)
Nurse to nurse report called and given to [facility] (nurse at [facility]). All questions answered and Revonda Standard expressed full understanding of discharge summary and nurse report. Patient aware she is being discharged back to [facility] and currently in agreement. Paper scripts (Percocet, Ambien and IV Invanz.) Midline intact along with dressing is clean, dry and intact. Midline flushing well. IV in right AC was removed by IV team nurses, no active bleeding noted and dressing is clean dry and intact. Patient to be discharged to [facility] via EMS transport at 1415.

## 2023-02-16 NOTE — Discharge Instructions (Signed)
1)Repeat TSH , free T 3, T4 blood tests in 6 weeks  2)Indication: ESBL Bacteremia -Antibiotic-- iv Invanz 1 GM daily via Midline/Picc Line First Dose: Yes Last Day of Therapy:  02/21/23  Labs - Once weekly:  CBC/D and BMP every Monday x 2 weeks starting 02/19/23, Labs - Once weekly: ESR and CRP every Monday x 2 weeks starting 02/19/23 3)Short acting insulin (Aspart) per sliding scale 0-10 ---:-- Insulin injection 0-10 Units 0-10 Units Subcutaneous, 3 times daily with meals CBG 70 - 120: 0 unit CBG 121 - 150: 0 unit  CBG 151 - 200: 1 unit CBG 201 - 250: 2 units CBG 251 - 300: 4 units CBG 301 - 350: 6 units  CBG 351 - 400: 8 units  CBG > 400: 10 units

## 2023-02-17 ENCOUNTER — Emergency Department (HOSPITAL_COMMUNITY)
Admission: EM | Admit: 2023-02-17 | Discharge: 2023-02-18 | Disposition: A | Discharge status: Home / Self Care | Payer: Medicare Other | Source: Home / Self Care | Attending: Emergency Medicine | Admitting: Emergency Medicine

## 2023-02-17 ENCOUNTER — Other Ambulatory Visit: Payer: Self-pay

## 2023-02-17 DIAGNOSIS — A499 Bacterial infection, unspecified: Secondary | ICD-10-CM | POA: Diagnosis not present

## 2023-02-17 DIAGNOSIS — Z794 Long term (current) use of insulin: Secondary | ICD-10-CM | POA: Insufficient documentation

## 2023-02-17 DIAGNOSIS — N390 Urinary tract infection, site not specified: Secondary | ICD-10-CM | POA: Diagnosis not present

## 2023-02-17 DIAGNOSIS — Z452 Encounter for adjustment and management of vascular access device: Secondary | ICD-10-CM | POA: Insufficient documentation

## 2023-02-17 DIAGNOSIS — Z7901 Long term (current) use of anticoagulants: Secondary | ICD-10-CM | POA: Insufficient documentation

## 2023-02-17 DIAGNOSIS — T82898A Other specified complication of vascular prosthetic devices, implants and grafts, initial encounter: Secondary | ICD-10-CM | POA: Insufficient documentation

## 2023-02-17 DIAGNOSIS — T82594A Other mechanical complication of infusion catheter, initial encounter: Secondary | ICD-10-CM | POA: Diagnosis not present

## 2023-02-17 DIAGNOSIS — F314 Bipolar disorder, current episode depressed, severe, without psychotic features: Secondary | ICD-10-CM | POA: Diagnosis not present

## 2023-02-17 MED ORDER — OXYCODONE HCL 5 MG PO TABS
5.0000 mg | ORAL_TABLET | Freq: Once | ORAL | Status: AC
  Administered 2023-02-17: 5 mg via ORAL
  Filled 2023-02-17: qty 1

## 2023-02-17 MED ORDER — OXYCODONE-ACETAMINOPHEN 5-325 MG PO TABS
1.0000 | ORAL_TABLET | Freq: Once | ORAL | Status: AC
  Administered 2023-02-17: 1 via ORAL
  Filled 2023-02-17: qty 1

## 2023-02-17 NOTE — ED Triage Notes (Signed)
Pt arrived [facility] from [facility] due to line obstructed.

## 2023-02-17 NOTE — ED Provider Notes (Signed)
[place] EMERGENCY DEPARTMENT AT [facility] Provider Note   CSN: [id number] Arrival date & time: 02/17/23  1823     History  Chief Complaint  Patient presents with   Vascular Access Problem    [name] is a 67 y.o. female.  HPI   This patient is a 67 year old female, currently being treated for urinary tract infection receiving Invanz intravenously, she is currently at [facility] nursing facility.  She has no complaints but the staff at the facility said that her right upper extremity midline catheter was not flushing and seem to be leaking.  Patient has no other complaints  Home Medications Prior to Admission medications   Medication Sig Start Date End Date Taking? Authorizing Provider  acetaminophen (TYLENOL) 325 MG tablet Take 650 mg by mouth every 4 (four) hours as needed for mild pain.    [provider]  ascorbic acid (VITAMIN C) 500 MG tablet Take 500 mg by mouth 2 (two) times daily.    [provider]  atenolol (TENORMIN) 25 MG tablet Take 1 tablet (25 mg total) by mouth daily. 02/16/23 02/16/24  Shon Hale, MD  busPIRone (BUSPAR) 5 MG tablet Take 5 mg by mouth 2 (two) times daily. 01/14/23   [provider]  calcium carbonate (TUMS - DOSED IN MG ELEMENTAL CALCIUM) 500 MG chewable tablet Chew 1 tablet by mouth every 5 (five) hours as needed for indigestion or heartburn.    [provider]  ELIQUIS 5 MG TABS tablet Take 5 mg by mouth 2 (two) times daily. 01/18/23   [provider]  ertapenem (INVANZ) IVPB Inject 1 g into the vein daily for 6 days. Indication:  ESBL Bacteremia First Dose: Yes Last Day of Therapy:  02/21/23  Labs - Once weekly:  CBC/D and BMP, Labs - Once weekly: ESR and CRP 02/16/23 02/22/23  Shon Hale, MD  folic acid (FOLVITE) 1 MG tablet Take 1 mg by mouth daily.    [provider]  gabapentin (NEURONTIN) 100 MG capsule Take 300 mg by mouth 3 (three) times daily. 07/20/21    [provider]  hydrochlorothiazide (HYDRODIURIL) 25 MG tablet Take 0.5 tablets (12.5 mg total) by mouth daily. 02/16/23   Shon Hale, MD  hydrOXYzine (ATARAX) 25 MG tablet Take 1 tablet (25 mg total) by mouth every 6 (six) hours as needed for itching. 08/27/22   Jacalyn Lefevre, MD  insulin aspart (FIASP FLEXTOUCH) 100 UNIT/ML FlexTouch Pen Inject 0-10 Units into the skin 4 (four) times daily -  before meals and at bedtime. Short acting insulin (Aspart) per sliding scale 0-10 ---:-- Insulin injection 0-10 Units 0-10 Units Subcutaneous, 3 times daily with meals CBG 70 - 120: 0 unit CBG 121 - 150: 0 unit  CBG 151 - 200: 1 unit CBG 201 - 250: 2 units CBG 251 - 300: 4 units CBG 301 - 350: 6 units  CBG 351 - 400: 8 units  CBG > 400: 10 units 02/16/23   Emokpae, Courage, MD  Ketoconazole 2 % FOAM Apply topically. 01/12/23   [provider]  lactulose (CHRONULAC) 10 GM/15ML solution Take 30 mLs by mouth daily as needed for moderate constipation. 01/18/23   [provider]  lidocaine (LIDODERM) 5 % Place 1 patch onto the skin daily. Remove & Discard patch within 12 hours or as directed by MD; Apply to LOWER LEG    [provider]  lithium carbonate 300 MG capsule Take 600 mg by mouth daily. 11/24/22  [provider]  loperamide (IMODIUM A-D) 2 MG tablet Take 2 mg by mouth every 6 (six) hours as needed for diarrhea or loose stools.    [provider]  LORazepam (ATIVAN) 0.5 MG tablet Take 1 tablet (0.5 mg total) by mouth at bedtime as needed for anxiety or sleep. 02/16/23   Shon Hale, MD  Melatonin 10 MG TABS Take 10 mg by mouth at bedtime.    [provider]  metFORMIN (GLUCOPHAGE) 500 MG tablet Take 1 pill a day Patient taking differently: Take 500 mg by mouth daily with breakfast. Take 1 pill a day 12/17/22   Bethann Berkshire, MD  methimazole (TAPAZOLE) 5 MG tablet Take 1 tablet (5 mg total) by mouth 2 (two) times daily. 02/16/23   Shon Hale, MD  midodrine (PROAMATINE) 5 MG tablet Take 1 tablet (5 mg total) by mouth 3 (three) times daily with meals. 02/16/23   Shon Hale, MD  Multiple Vitamin (MULTIVITAMIN ADULT PO) Take 1 tablet by mouth daily.    [provider]  OLANZapine (ZYPREXA) 10 MG tablet Take 10 mg by mouth at bedtime. 01/31/23   [provider]  ondansetron (ZOFRAN) 4 MG tablet Take 4 mg by mouth every 8 (eight) hours as needed. 12/14/22   [provider]  oxyCODONE-acetaminophen (PERCOCET) 10-325 MG tablet Take 1 tablet by mouth every 4 (four) hours as needed. 02/16/23   Shon Hale, MD  potassium chloride 20 MEQ/15ML (10%) SOLN Take 10 mEq by mouth daily. 12/20/22   [provider]  pyrithione zinc (SELSUN BLUE ITCHY DRY SCALP) 1 % shampoo Apply topically daily as needed for itching. 08/27/22   Jacalyn Lefevre, MD  saccharomyces boulardii (FLORASTOR) 250 MG capsule Take 250 mg by mouth daily.    [provider]  senna-docusate (SENOKOT-S) 8.6-50 MG tablet Take 2 tablets by mouth 2 (two) times daily. May hold for loose stools    [provider]  tamsulosin (FLOMAX) 0.4 MG CAPS capsule Take 0.4 mg by mouth daily.    [provider]  zolpidem (AMBIEN) 5 MG tablet Take 1 tablet (5 mg total) by mouth at bedtime as needed for sleep. 02/16/23   Shon Hale, MD      Allergies    Sulfa antibiotics    Review of Systems   Review of Systems  All other systems reviewed and are negative.   Physical Exam Updated Vital Signs BP (!) 121/103   Pulse 94   Temp 98.3 F (36.8 C) (Oral)   Resp 19   Ht 1.753 m (5\' 9" )   Wt 102.1 kg   SpO2 96%   BMI 33.24 kg/m  Physical Exam Vitals and nursing note reviewed.  Constitutional:      General: She is not in acute distress.    Appearance: She is well-developed.  HENT:     Head: Normocephalic and atraumatic.     Mouth/Throat:     Pharynx: No oropharyngeal exudate.  Eyes:     General: No scleral  icterus.       Right eye: No discharge.        Left eye: No discharge.     Conjunctiva/sclera: Conjunctivae normal.     Pupils: Pupils are equal, round, and reactive to light.  Neck:     Thyroid: No thyromegaly.     Vascular: No JVD.  Cardiovascular:     Rate and Rhythm: Normal rate and regular rhythm.     Heart sounds: Normal heart sounds. No murmur  heard.    No friction rub. No gallop.  Pulmonary:     Effort: Pulmonary effort is normal. No respiratory distress.     Breath sounds: Normal breath sounds. No wheezing or rales.  Abdominal:     General: Bowel sounds are normal. There is no distension.     Palpations: Abdomen is soft. There is no mass.     Tenderness: There is no abdominal tenderness.  Musculoskeletal:        General: No tenderness. Normal range of motion.     Cervical back: Normal range of motion and neck supple.  Lymphadenopathy:     Cervical: No cervical adenopathy.  Skin:    General: Skin is warm and dry.     Findings: No erythema or rash.  Neurological:     Mental Status: She is alert.     Coordination: Coordination normal.  Psychiatric:        Behavior: Behavior normal.     ED Results / Procedures / Treatments   Labs (all labs ordered are listed, but only abnormal results are displayed) Labs Reviewed - No data to display  EKG None  Radiology No results found.  Procedures .Central Line  Date/Time: 02/17/2023 11:39 PM  Performed by: Eber Hong, MD Authorized by: Eber Hong, MD   Consent:    Consent obtained:  Verbal   Consent given by:  Patient   Risks, benefits, and alternatives were discussed: yes     Risks discussed:  Arterial puncture, incorrect placement, nerve damage, bleeding and infection   Alternatives discussed:  No treatment Universal protocol:    Procedure explained and questions answered to patient or proxy's satisfaction: yes     Immediately prior to procedure, a time out was called: yes     Patient identity confirmed:   Verbally with patient Pre-procedure details:    Indication(s): needed after discharge for ongoing care and insufficient peripheral access     Hand hygiene: Hand hygiene performed prior to insertion     Sterile barrier technique: All elements of maximal sterile technique followed     Skin preparation:  Chlorhexidine   Skin preparation agent: Skin preparation agent completely dried prior to procedure   Sedation:    Sedation type:  None Anesthesia:    Anesthesia method:  None Procedure details:    Location: left upper arm.   Patient position:  Reverse Trendelenburg   Procedural supplies:  Single lumen   Catheter size: midline catheter.   Landmarks identified: yes     Ultrasound guidance: yes     Ultrasound guidance timing: prior to insertion and real time     Sterile ultrasound techniques: Sterile gel and sterile probe covers were used     Number of attempts:  1   Successful placement: yes   Post-procedure details:    Post-procedure:  Dressing applied and line sutured   Assessment:  Blood return through all ports and free fluid flow   Procedure completion:  Tolerated well, no immediate complications Comments:           Medications Ordered in ED Medications  oxyCODONE-acetaminophen (PERCOCET/ROXICET) 5-325 MG per tablet 1 tablet (1 tablet Oral Given 02/17/23 2140)    And  oxyCODONE (Oxy IR/ROXICODONE) immediate release tablet 5 mg (5 mg Oral Given 02/17/23 2140)    ED Course/ Medical Decision Making/ A&P  Medical Decision Making Risk Prescription drug management.   I have examined the catheter at the bedside, there does not appear to be any kinks there is no redness there is no drainage or discharge, it will not flush.  Catheter removed, I was able to successfully place an ultrasound-guided midline catheter in the left upper extremity, see procedure note        Final Clinical Impression(s) / ED Diagnoses Final diagnoses:  Occlusion  of peripherally inserted central catheter (PICC) line, initial encounter [facility])    Rx / DC Orders ED Discharge Orders     None         Eber Hong, MD 02/17/23 2340

## 2023-02-17 NOTE — Discharge Instructions (Signed)
We were able to replace your PICC line with a new PICC line or midline catheter in your left arm.  Please make sure that it is getting flushed every time that it is used.  You may use this for all of your antibiotics, return to the ER for severe or worsening symptoms otherwise have your family doctor recheck you on Tuesday to make sure that the site looks okay.

## 2023-02-17 NOTE — ED Notes (Signed)
Dr Hyacinth Meeker at bedside to assess site prior to triage. Right upper arm line appears to be clogged and unable to flow any fluid.

## 2023-02-18 ENCOUNTER — Inpatient Hospital Stay (HOSPITAL_COMMUNITY)
Admission: EM | Admit: 2023-02-18 | Discharge: 2023-02-22 | DRG: 315 | Disposition: A | Discharge status: Skilled Nursing Facility | Payer: Medicare Other | Source: Skilled Nursing Facility | Attending: Internal Medicine | Admitting: Internal Medicine

## 2023-02-18 ENCOUNTER — Other Ambulatory Visit: Payer: Self-pay

## 2023-02-18 ENCOUNTER — Emergency Department (HOSPITAL_COMMUNITY): Payer: Medicare Other

## 2023-02-18 ENCOUNTER — Encounter (HOSPITAL_COMMUNITY): Payer: Self-pay

## 2023-02-18 DIAGNOSIS — Z7901 Long term (current) use of anticoagulants: Secondary | ICD-10-CM

## 2023-02-18 DIAGNOSIS — I10 Essential (primary) hypertension: Secondary | ICD-10-CM | POA: Diagnosis present

## 2023-02-18 DIAGNOSIS — T82594A Other mechanical complication of infusion catheter, initial encounter: Principal | ICD-10-CM | POA: Diagnosis present

## 2023-02-18 DIAGNOSIS — E876 Hypokalemia: Secondary | ICD-10-CM | POA: Diagnosis not present

## 2023-02-18 DIAGNOSIS — Z882 Allergy status to sulfonamides status: Secondary | ICD-10-CM

## 2023-02-18 DIAGNOSIS — E669 Obesity, unspecified: Secondary | ICD-10-CM | POA: Diagnosis present

## 2023-02-18 DIAGNOSIS — Z79899 Other long term (current) drug therapy: Secondary | ICD-10-CM

## 2023-02-18 DIAGNOSIS — G4700 Insomnia, unspecified: Secondary | ICD-10-CM | POA: Diagnosis present

## 2023-02-18 DIAGNOSIS — B9629 Other Escherichia coli [E. coli] as the cause of diseases classified elsewhere: Secondary | ICD-10-CM | POA: Diagnosis present

## 2023-02-18 DIAGNOSIS — Z79890 Hormone replacement therapy (postmenopausal): Secondary | ICD-10-CM

## 2023-02-18 DIAGNOSIS — I82409 Acute embolism and thrombosis of unspecified deep veins of unspecified lower extremity: Secondary | ICD-10-CM | POA: Diagnosis present

## 2023-02-18 DIAGNOSIS — K219 Gastro-esophageal reflux disease without esophagitis: Secondary | ICD-10-CM | POA: Diagnosis present

## 2023-02-18 DIAGNOSIS — G8929 Other chronic pain: Secondary | ICD-10-CM | POA: Diagnosis present

## 2023-02-18 DIAGNOSIS — Z1612 Extended spectrum beta lactamase (ESBL) resistance: Secondary | ICD-10-CM | POA: Diagnosis not present

## 2023-02-18 DIAGNOSIS — E0590 Thyrotoxicosis, unspecified without thyrotoxic crisis or storm: Secondary | ICD-10-CM | POA: Diagnosis present

## 2023-02-18 DIAGNOSIS — B9620 Unspecified Escherichia coli [E. coli] as the cause of diseases classified elsewhere: Secondary | ICD-10-CM | POA: Diagnosis present

## 2023-02-18 DIAGNOSIS — Z7401 Bed confinement status: Secondary | ICD-10-CM

## 2023-02-18 DIAGNOSIS — Z86718 Personal history of other venous thrombosis and embolism: Secondary | ICD-10-CM

## 2023-02-18 DIAGNOSIS — N390 Urinary tract infection, site not specified: Principal | ICD-10-CM | POA: Diagnosis present

## 2023-02-18 DIAGNOSIS — E1165 Type 2 diabetes mellitus with hyperglycemia: Secondary | ICD-10-CM | POA: Diagnosis present

## 2023-02-18 DIAGNOSIS — Z833 Family history of diabetes mellitus: Secondary | ICD-10-CM

## 2023-02-18 DIAGNOSIS — Z6833 Body mass index (BMI) 33.0-33.9, adult: Secondary | ICD-10-CM

## 2023-02-18 DIAGNOSIS — Z803 Family history of malignant neoplasm of breast: Secondary | ICD-10-CM

## 2023-02-18 DIAGNOSIS — Y712 Prosthetic and other implants, materials and accessory cardiovascular devices associated with adverse incidents: Secondary | ICD-10-CM | POA: Diagnosis present

## 2023-02-18 DIAGNOSIS — M21371 Foot drop, right foot: Secondary | ICD-10-CM | POA: Diagnosis present

## 2023-02-18 DIAGNOSIS — Z66 Do not resuscitate: Secondary | ICD-10-CM | POA: Diagnosis present

## 2023-02-18 DIAGNOSIS — Z7984 Long term (current) use of oral hypoglycemic drugs: Secondary | ICD-10-CM

## 2023-02-18 DIAGNOSIS — Z800 Family history of malignant neoplasm of digestive organs: Secondary | ICD-10-CM

## 2023-02-18 DIAGNOSIS — F919 Conduct disorder, unspecified: Secondary | ICD-10-CM | POA: Diagnosis present

## 2023-02-18 DIAGNOSIS — G2581 Restless legs syndrome: Secondary | ICD-10-CM | POA: Diagnosis present

## 2023-02-18 DIAGNOSIS — A499 Bacterial infection, unspecified: Principal | ICD-10-CM

## 2023-02-18 DIAGNOSIS — F314 Bipolar disorder, current episode depressed, severe, without psychotic features: Secondary | ICD-10-CM | POA: Diagnosis present

## 2023-02-18 DIAGNOSIS — M25512 Pain in left shoulder: Secondary | ICD-10-CM | POA: Diagnosis present

## 2023-02-18 DIAGNOSIS — Z85118 Personal history of other malignant neoplasm of bronchus and lung: Secondary | ICD-10-CM

## 2023-02-18 DIAGNOSIS — F17210 Nicotine dependence, cigarettes, uncomplicated: Secondary | ICD-10-CM | POA: Diagnosis present

## 2023-02-18 DIAGNOSIS — Z902 Acquired absence of lung [part of]: Secondary | ICD-10-CM

## 2023-02-18 LAB — BASIC METABOLIC PANEL
Anion gap: 10 (ref 5–15)
BUN: 9 mg/dL (ref 8–23)
CO2: 27 mmol/L (ref 22–32)
Calcium: 8.7 mg/dL — ABNORMAL LOW (ref 8.9–10.3)
Chloride: 97 mmol/L — ABNORMAL LOW (ref 98–111)
Creatinine, Ser: 0.5 mg/dL (ref 0.44–1.00)
GFR, Estimated: >60 mL/min (ref >60)
Glucose, Bld: 263 mg/dL — ABNORMAL HIGH (ref 70–99)
Potassium: 3.7 mmol/L (ref 3.5–5.1)
Sodium: 134 mmol/L — ABNORMAL LOW (ref 135–145)

## 2023-02-18 LAB — CBC
HCT: 40.5 % (ref 36.0–46.0)
Hemoglobin: 13 g/dL (ref 12.0–15.0)
MCH: 29.6 pg (ref 26.0–34.0)
MCHC: 32.1 g/dL (ref 30.0–36.0)
MCV: 92.3 fL (ref 80.0–100.0)
Platelets: 200 10*3/uL (ref 150–400)
RBC: 4.39 MIL/uL (ref 3.87–5.11)
RDW: 12.8 % (ref 11.5–15.5)
WBC: 5.6 10*3/uL (ref 4.0–10.5)
nRBC: 0 % (ref 0.0–0.2)

## 2023-02-18 LAB — CBG MONITORING, ED: Glucose-Capillary: 281 mg/dL — ABNORMAL HIGH (ref 70–99)

## 2023-02-18 LAB — TROPONIN I (HIGH SENSITIVITY)
Troponin I (High Sensitivity): <2 ng/L (ref <18)
Troponin I (High Sensitivity): <2 ng/L (ref <18)

## 2023-02-18 MED ORDER — MIDODRINE HCL 5 MG PO TABS
5.0000 mg | ORAL_TABLET | Freq: Three times a day (TID) | ORAL | Status: DC
  Administered 2023-02-19 – 2023-02-22 (×12): 5 mg via ORAL
  Filled 2023-02-18 (×11): qty 1

## 2023-02-18 MED ORDER — LACTULOSE 10 GM/15ML PO SOLN: 20.0000 g | Freq: Every day | ORAL | Status: DC | PRN

## 2023-02-18 MED ORDER — APIXABAN 5 MG PO TABS
5.0000 mg | ORAL_TABLET | Freq: Two times a day (BID) | ORAL | Status: DC
  Administered 2023-02-18 – 2023-02-22 (×8): 5 mg via ORAL
  Filled 2023-02-18 (×8): qty 1

## 2023-02-18 MED ORDER — HYDROCHLOROTHIAZIDE 25 MG PO TABS: 12.5000 mg | ORAL_TABLET | Freq: Every day | ORAL | Status: DC

## 2023-02-18 MED ORDER — INSULIN ASPART 100 UNIT/ML IJ SOLN
0.0000 [IU] | Freq: Three times a day (TID) | INTRAMUSCULAR | Status: DC
  Administered 2023-02-19 – 2023-02-20 (×2): 8 [IU] via SUBCUTANEOUS
  Administered 2023-02-21: 15 [IU] via SUBCUTANEOUS
  Administered 2023-02-22: 3 [IU] via SUBCUTANEOUS
  Administered 2023-02-22: 5 [IU] via SUBCUTANEOUS
  Administered 2023-02-22: 2 [IU] via SUBCUTANEOUS

## 2023-02-18 MED ORDER — LITHIUM CARBONATE 150 MG PO CAPS
600.0000 mg | ORAL_CAPSULE | Freq: Every day | ORAL | Status: DC
  Administered 2023-02-19 – 2023-02-22 (×4): 600 mg via ORAL
  Filled 2023-02-18 (×3): qty 4

## 2023-02-18 MED ORDER — OLANZAPINE 5 MG PO TABS
10.0000 mg | ORAL_TABLET | Freq: Every day | ORAL | Status: DC
  Administered 2023-02-18 – 2023-02-21 (×4): 10 mg via ORAL
  Filled 2023-02-18 (×4): qty 2

## 2023-02-18 MED ORDER — OXYCODONE-ACETAMINOPHEN 5-325 MG PO TABS
2.0000 | ORAL_TABLET | Freq: Once | ORAL | Status: AC
  Administered 2023-02-18: 2 via ORAL
  Filled 2023-02-18: qty 2

## 2023-02-18 MED ORDER — INSULIN ASPART 100 UNIT/ML IJ SOLN
0.0000 [IU] | Freq: Every day | INTRAMUSCULAR | Status: DC
  Administered 2023-02-18 – 2023-02-20 (×2): 3 [IU] via SUBCUTANEOUS
  Filled 2023-02-18: qty 1

## 2023-02-18 MED ORDER — MELATONIN 3 MG PO TABS
9.0000 mg | ORAL_TABLET | Freq: Every day | ORAL | Status: DC
  Administered 2023-02-18 – 2023-02-21 (×4): 9 mg via ORAL
  Filled 2023-02-18 (×4): qty 3

## 2023-02-18 MED ORDER — ATENOLOL 25 MG PO TABS
25.0000 mg | ORAL_TABLET | Freq: Every day | ORAL | Status: DC
  Filled 2023-02-18: qty 1

## 2023-02-18 MED ORDER — SACCHAROMYCES BOULARDII 250 MG PO CAPS
250.0000 mg | ORAL_CAPSULE | Freq: Every day | ORAL | Status: DC
  Administered 2023-02-19 – 2023-02-22 (×4): 250 mg via ORAL
  Filled 2023-02-18 (×4): qty 1

## 2023-02-18 MED ORDER — SODIUM CHLORIDE 0.9 % IV SOLN
1.0000 g | Freq: Three times a day (TID) | INTRAVENOUS | Status: AC
  Administered 2023-02-18 – 2023-02-21 (×10): 1 g via INTRAVENOUS
  Filled 2023-02-18 (×10): qty 20

## 2023-02-18 MED ORDER — OXYCODONE HCL 5 MG PO TABS
5.0000 mg | ORAL_TABLET | Freq: Four times a day (QID) | ORAL | Status: DC | PRN
  Administered 2023-02-18 – 2023-02-21 (×7): 5 mg via ORAL
  Filled 2023-02-18 (×7): qty 1

## 2023-02-18 MED ORDER — METHIMAZOLE 5 MG PO TABS
5.0000 mg | ORAL_TABLET | Freq: Two times a day (BID) | ORAL | Status: DC
  Administered 2023-02-18 – 2023-02-22 (×8): 5 mg via ORAL
  Filled 2023-02-18 (×8): qty 1

## 2023-02-18 MED ORDER — OXYCODONE-ACETAMINOPHEN 5-325 MG PO TABS
1.0000 | ORAL_TABLET | Freq: Four times a day (QID) | ORAL | Status: DC | PRN
  Administered 2023-02-18 – 2023-02-21 (×8): 1 via ORAL
  Filled 2023-02-18 (×8): qty 1

## 2023-02-18 MED ORDER — OXYCODONE-ACETAMINOPHEN 5-325 MG PO TABS
1.0000 | ORAL_TABLET | Freq: Once | ORAL | Status: AC
  Administered 2023-02-18: 1 via ORAL
  Filled 2023-02-18: qty 1

## 2023-02-18 MED ORDER — BUSPIRONE HCL 5 MG PO TABS
5.0000 mg | ORAL_TABLET | Freq: Two times a day (BID) | ORAL | Status: DC
  Administered 2023-02-18 – 2023-02-22 (×8): 5 mg via ORAL
  Filled 2023-02-18 (×8): qty 1

## 2023-02-18 MED ORDER — OXYCODONE HCL 5 MG PO TABS
5.0000 mg | ORAL_TABLET | Freq: Once | ORAL | Status: AC
  Administered 2023-02-18: 5 mg via ORAL
  Filled 2023-02-18: qty 1

## 2023-02-18 MED ORDER — OXYCODONE-ACETAMINOPHEN 10-325 MG PO TABS: 1.0000 | ORAL_TABLET | Freq: Four times a day (QID) | ORAL | Status: DC | PRN

## 2023-02-18 MED ORDER — GABAPENTIN 300 MG PO CAPS
300.0000 mg | ORAL_CAPSULE | Freq: Three times a day (TID) | ORAL | Status: DC
  Administered 2023-02-18 – 2023-02-22 (×12): 300 mg via ORAL
  Filled 2023-02-18 (×12): qty 1

## 2023-02-18 MED ORDER — TAMSULOSIN HCL 0.4 MG PO CAPS
0.4000 mg | ORAL_CAPSULE | Freq: Every day | ORAL | Status: DC
  Administered 2023-02-19 – 2023-02-22 (×4): 0.4 mg via ORAL
  Filled 2023-02-18 (×5): qty 1

## 2023-02-18 MED ORDER — LORAZEPAM 0.5 MG PO TABS
0.5000 mg | ORAL_TABLET | Freq: Every evening | ORAL | Status: DC | PRN
  Administered 2023-02-18 – 2023-02-21 (×4): 0.5 mg via ORAL
  Filled 2023-02-18 (×4): qty 1

## 2023-02-18 NOTE — Progress Notes (Signed)
Pharmacy Antibiotic Note  [name] is a 67 y.o. female with PMH of T2DM, BPD, and SUD. Patient presenting to ED for issues with PICC line. Recently hospitalized from 10/1-10/4 for ESBL E. Coli bacteremia and discharged on ertapenem 1 g every 24 hours (end date of 02/21/23 per ID). Pharmacy has been consulted to transition to meropenem while patient is admitted.  Plan: Start meropenem 1 g IV every 8 hours Monitor renal function, clinical status, culture data F/u LOT  Height: 5\' 9"  (175.3 cm) Weight: 102.1 kg (225 lb 1.4 oz) IBW/kg (Calculated) : 66.2  Temp (24hrs), Avg:98.2 F (36.8 C), Min:98.1 F (36.7 C), Max:98.3 F (36.8 C)  Recent Labs  Lab 02/13/23 1504 02/13/23 1700 02/14/23 0510 02/14/23 0841 02/14/23 1107 02/15/23 0319 02/16/23 0503 02/18/23 1851  WBC 9.9  --  12.0*  --   --  6.2 3.8* 5.6  CREATININE 0.59  --  0.46  --   --  0.48 0.43* 0.50  LATICACIDVEN 2.7* 2.0*  --  1.3 1.1  --   --   --     Estimated Creatinine Clearance: 89.2 mL/min (by C-G formula based on SCr of 0.5 mg/dL).    Allergies  Allergen Reactions   Sulfa Antibiotics Swelling and Rash    Neck swelling    Antimicrobials this admission: meropenem 10/06 >>   Dose adjustments this admission:  N/A  Microbiology results: N/A  Thank you for involving pharmacy in this patient's care.   Rockwell Alexandria, PharmD Clinical Pharmacist 02/18/2023 9:59 PM

## 2023-02-18 NOTE — ED Notes (Signed)
Attempted calling report x 3 to [facility]

## 2023-02-18 NOTE — ED Triage Notes (Signed)
Pt BIB [facility] for PICC line issues per [facility] states it is not working right. Pt is complaining of left chest pain and bilateral foot pain.

## 2023-02-18 NOTE — Assessment & Plan Note (Signed)
Resume Eliquis 

## 2023-02-18 NOTE — Assessment & Plan Note (Signed)
Stable.  Not sure why home med list has midodrine, atenolol, HCTZ. -Resume midodrine and atenolol for now.

## 2023-02-18 NOTE — Assessment & Plan Note (Addendum)
Uncontrolled.  A1c 10.9.   - Blood glucose 263. - SSI- M -Hold home metformin while inpatient

## 2023-02-18 NOTE — ED Notes (Signed)
Patient verbalizes understanding of discharge instructions. Opportunity for questioning and answers were provided. Armband removed by staff, pt discharged from ED with staff.

## 2023-02-18 NOTE — H&P (Addendum)
History and Physical    [name] HKV:[id number] DOB: [birth year] DOA: 02/18/2023  PCP: Patient, [name] Per   Patient coming from: [facility]  I have personally briefly reviewed patient's old medical records in [facility]  Chief Complaint: PICC line issues  HPI: [name] is a 67 y.o. female with medical history significant for BPD, Drug abuse, DM.  Patient was sent to the ED reports of problems with her PICC line. Patient was recently hospitalized 10/1 to 10/4 for septic shock secondary to ESBL E. coli UTI and bacteremia, with hyperglycemia, and encephalopathy.  She was treated with IV meropenem in the hospital and discharged on Invanz via PICC line, last dose was to be 10/9. She was in the ED yesterday due to problems with her PICC line, and the new PICC line was placed yesterday in the ED.  Patient was discharged back to nursing home. Patient was brought back today, because PICC line was not functioning.  She has not gotten a dose of medication today.  Nursing home staff, patient was picking on the PICC line.  At the time of my evaluation patient is awake alert oriented x 4.  Able to answer questions appropriately.  She reports acute on chronic pain to her left foot, due to hitting it while on the wheelchair.  She otherwise has no other complaints.  ED Course: Stable vitals.  Chest x-ray negative for acute abnormality.  Left foot x-ray shows mild soft tissue edema. Hospitalist to admit for antibiotics.  Review of Systems: As per HPI all other systems reviewed and negative.  Past Medical History:  Diagnosis Date   Allergy    Bipolar disorder (HCC)    Depression    Drug abuse (HCC)    history of, went to rehab 2015   GERD (gastroesophageal reflux disease)    Lung cancer (HCC)    lung ca dx 11/11- right upper lobe    Past Surgical History:  Procedure Laterality Date   ABDOMINAL AORTOGRAM W/LOWER EXTREMITY Bilateral 01/10/2022   Procedure: ABDOMINAL AORTOGRAM  W/LOWER EXTREMITY;  Surgeon: Nada Libman, MD;  Location: [facility];  Service: Cardiovascular;  Laterality: Bilateral;   ANKLE FRACTURE SURGERY Left    HARDWARE REMOVAL Left 12/13/2021   Procedure: LEFT ANKLE HARDWARE REMOVAL;  Surgeon: Marcene Corning, MD;  Location: [facility];  Service: Orthopedics;  Laterality: Left;   LUNG LOBECTOMY Right 2011   RUL removed for lung cancer   STYLOID PROCESS EXCISION Left 10/16/2014   Procedure: EXCISION LEFT STYLOID PROCESS;  Surgeon: Drema Halon, MD;  Location: [facility];  Service: ENT;  Laterality: Left;   TONSILLECTOMY Bilateral 10/16/2014   Procedure: BILATERAL TONSILLECTOMY;  Surgeon: Drema Halon, MD;  Location: [facility];  Service: ENT;  Laterality: Bilateral;   TUBAL LIGATION  1984     reports that she has quit smoking. Her smoking use included cigarettes. She has a 40 pack-year smoking history. She has never used smokeless tobacco. She reports that she does not currently use alcohol. She reports that she does not currently use drugs after having used the following drugs: Benzodiazepines.  Allergies  Allergen Reactions   Sulfa Antibiotics Swelling and Rash    Neck swelling    Family History  Problem Relation Age of Onset   Cancer Mother        breast cancer   Cancer Father        stomach cancer   Diabetes Father  Cancer Sister        breast cancer   Diabetes Brother    Diabetes Brother    Cancer Sister        breast cancer    Prior to Admission medications   Medication Sig Start Date End Date Taking? Authorizing Provider  acetaminophen (TYLENOL) 325 MG tablet Take 650 mg by mouth every 4 (four) hours as needed for mild pain.    [provider]  ascorbic acid (VITAMIN C) 500 MG tablet Take 500 mg by mouth 2 (two) times daily.    [provider]  atenolol (TENORMIN) 25 MG tablet Take 1 tablet (25 mg total) by mouth daily. 02/16/23 02/16/24  Shon Hale, MD   busPIRone (BUSPAR) 5 MG tablet Take 5 mg by mouth 2 (two) times daily. 01/14/23   [provider]  calcium carbonate (TUMS - DOSED IN MG ELEMENTAL CALCIUM) 500 MG chewable tablet Chew 1 tablet by mouth every 5 (five) hours as needed for indigestion or heartburn.    [provider]  ELIQUIS 5 MG TABS tablet Take 5 mg by mouth 2 (two) times daily. 01/18/23   [provider]  ertapenem (INVANZ) IVPB Inject 1 g into the vein daily for 6 days. Indication:  ESBL Bacteremia First Dose: Yes Last Day of Therapy:  02/21/23  Labs - Once weekly:  CBC/D and BMP, Labs - Once weekly: ESR and CRP 02/16/23 02/22/23  Shon Hale, MD  folic acid (FOLVITE) 1 MG tablet Take 1 mg by mouth daily.    [provider]  gabapentin (NEURONTIN) 100 MG capsule Take 300 mg by mouth 3 (three) times daily. 07/20/21   [provider]  hydrochlorothiazide (HYDRODIURIL) 25 MG tablet Take 0.5 tablets (12.5 mg total) by mouth daily. 02/16/23   Shon Hale, MD  hydrOXYzine (ATARAX) 25 MG tablet Take 1 tablet (25 mg total) by mouth every 6 (six) hours as needed for itching. 08/27/22   Jacalyn Lefevre, MD  insulin aspart (FIASP FLEXTOUCH) 100 UNIT/ML FlexTouch Pen Inject 0-10 Units into the skin 4 (four) times daily -  before meals and at bedtime. Short acting insulin (Aspart) per sliding scale 0-10 ---:-- Insulin injection 0-10 Units 0-10 Units Subcutaneous, 3 times daily with meals CBG 70 - 120: 0 unit CBG 121 - 150: 0 unit  CBG 151 - 200: 1 unit CBG 201 - 250: 2 units CBG 251 - 300: 4 units CBG 301 - 350: 6 units  CBG 351 - 400: 8 units  CBG > 400: 10 units 02/16/23   Quintez Maselli, Courage, MD  Ketoconazole 2 % FOAM Apply topically. 01/12/23   [provider]  lactulose (CHRONULAC) 10 GM/15ML solution Take 30 mLs by mouth daily as needed for moderate constipation. 01/18/23   [provider]  lidocaine (LIDODERM) 5 % Place 1 patch onto the skin daily. Remove & Discard patch within 12  hours or as directed by MD; Apply to LOWER LEG    [provider]  lithium carbonate 300 MG capsule Take 600 mg by mouth daily. 11/24/22   [provider]  loperamide (IMODIUM A-D) 2 MG tablet Take 2 mg by mouth every 6 (six) hours as needed for diarrhea or loose stools.    [provider]  LORazepam (ATIVAN) 0.5 MG tablet Take 1 tablet (0.5 mg total) by mouth at bedtime as needed for anxiety or sleep. 02/16/23   Shon Hale, MD  Melatonin 10 MG TABS Take 10 mg by mouth at bedtime.  [provider]  metFORMIN (GLUCOPHAGE) 500 MG tablet Take 1 pill a day Patient taking differently: Take 500 mg by mouth daily with breakfast. Take 1 pill a day 12/17/22   Bethann Berkshire, MD  methimazole (TAPAZOLE) 5 MG tablet Take 1 tablet (5 mg total) by mouth 2 (two) times daily. 02/16/23   Shon Hale, MD  midodrine (PROAMATINE) 5 MG tablet Take 1 tablet (5 mg total) by mouth 3 (three) times daily with meals. 02/16/23   Shon Hale, MD  Multiple Vitamin (MULTIVITAMIN ADULT PO) Take 1 tablet by mouth daily.    [provider]  OLANZapine (ZYPREXA) 10 MG tablet Take 10 mg by mouth at bedtime. 01/31/23   [provider]  ondansetron (ZOFRAN) 4 MG tablet Take 4 mg by mouth every 8 (eight) hours as needed. 12/14/22   [provider]  oxyCODONE-acetaminophen (PERCOCET) 10-325 MG tablet Take 1 tablet by mouth every 4 (four) hours as needed. 02/16/23   Shon Hale, MD  potassium chloride 20 MEQ/15ML (10%) SOLN Take 10 mEq by mouth daily. 12/20/22   [provider]  pyrithione zinc (SELSUN BLUE ITCHY DRY SCALP) 1 % shampoo Apply topically daily as needed for itching. 08/27/22   Jacalyn Lefevre, MD  saccharomyces boulardii (FLORASTOR) 250 MG capsule Take 250 mg by mouth daily.    [provider]  senna-docusate (SENOKOT-S) 8.6-50 MG tablet Take 2 tablets by mouth 2 (two) times daily. May hold for loose stools    [provider]   tamsulosin (FLOMAX) 0.4 MG CAPS capsule Take 0.4 mg by mouth daily.    [provider]  zolpidem (AMBIEN) 5 MG tablet Take 1 tablet (5 mg total) by mouth at bedtime as needed for sleep. 02/16/23   Shon Hale, MD    Physical Exam: Vitals:   02/18/23 1812 02/18/23 1813  BP: (!) 119/54   Pulse: 95   Resp: 18   Temp: 98.3 F (36.8 C)   TempSrc: Oral   SpO2: 96%   Weight:  102.1 kg  Height:  5\' 9"  (1.753 m)    Constitutional: NAD, calm, comfortable Vitals:   02/18/23 1812 02/18/23 1813  BP: (!) 119/54   Pulse: 95   Resp: 18   Temp: 98.3 F (36.8 C)   TempSrc: Oral   SpO2: 96%   Weight:  102.1 kg  Height:  5\' 9"  (1.753 m)   Eyes: PERRL, lids and conjunctivae normal ENMT: Mucous membranes are moist.  Neck: normal, supple, no masses, no thyromegaly Respiratory: clear to auscultation bilaterally, no wheezing, no crackles. Normal respiratory effort. No accessory muscle use.  Cardiovascular: Regular rate and rhythm, no murmurs / rubs / gallops. Trace extremity edema around foot.  Abdomen: no tenderness, no masses palpated. No hepatosplenomegaly. Bowel sounds positive.  Musculoskeletal: no clubbing / cyanosis.  Foot drop bilat lower extremities. Skin: no rashes, lesions, ulcers. No induration Neurologic: Bilateral foot drop- chronic, unchanged.  No facial asymmetry, moving extremities spontaneously.  Speech fluent.  Psychiatric: Normal judgment and insight. Alert and oriented x 3. Normal mood.   Labs on Admission: I have personally reviewed following labs and imaging studies  CBC: Recent Labs  Lab 02/13/23 1504 02/14/23 0510 02/15/23 0319 02/16/23 0503 02/18/23 1851  WBC 9.9 12.0* 6.2 3.8* 5.6  NEUTROABS 8.7* 10.0*  --   --   --   HGB 15.8* 12.8 11.9* 11.6* 13.0  HCT 47.8* 40.0 36.6 37.3 40.5  MCV 90.9 92.6 93.6 95.9 92.3  PLT 145* 117* 105* 106*  200   Basic Metabolic Panel: Recent Labs  Lab 02/13/23 1504 02/14/23 0510 02/14/23 0841 02/15/23 0319  02/16/23 0503 02/18/23 1851  NA 134* 138  --  139 138 134*  K 3.4* 3.0*  --  3.3* 3.6 3.7  CL 96* 104  --  108 108 97*  CO2 24 24  --  24 24 27   GLUCOSE 426* 221*  --  192* 188* 263*  BUN 10 9  --  7* 6* 9  CREATININE 0.59 0.46  --  0.48 0.43* 0.50  CALCIUM 9.3 8.5*  --  8.3* 8.2* 8.7*  MG  --  1.6*  --  1.9 2.0  --   PHOS  --   --  1.5* 2.2* 2.2*  --    GFR: Estimated Creatinine Clearance: 89.2 mL/min (by C-G formula based on SCr of 0.5 mg/dL). Liver Function Tests: Recent Labs  Lab 02/13/23 1504 02/14/23 0510 02/16/23 0503  AST 23 15  --   ALT 30 22  --   ALKPHOS 99 72  --   BILITOT 1.7* 0.7  --   PROT 7.2 5.8*  --   ALBUMIN 3.6 2.8* 2.4*   Recent Labs  Lab 02/13/23 1504  LIPASE 19   Recent Labs  Lab 02/14/23 0510  AMMONIA 25   Coagulation Profile: Recent Labs  Lab 02/13/23 1504  INR 1.3*   CBG: Recent Labs  Lab 02/15/23 1935 02/16/23 0024 02/16/23 0323 02/16/23 0756 02/16/23 1143  GLUCAP 168* 199* 175* 175* 237*   Urine analysis:    Component Value Date/Time   COLORURINE YELLOW 02/13/2023 1725   APPEARANCEUR HAZY (A) 02/13/2023 1725   LABSPEC 1.015 02/13/2023 1725   PHURINE 6.0 02/13/2023 1725   GLUCOSEU >=500 (A) 02/13/2023 1725   HGBUR MODERATE (A) 02/13/2023 1725   BILIRUBINUR NEGATIVE 02/13/2023 1725   KETONESUR 20 (A) 02/13/2023 1725   PROTEINUR 30 (A) 02/13/2023 1725   UROBILINOGEN 0.2 03/11/2010 1057   NITRITE NEGATIVE 02/13/2023 1725   LEUKOCYTESUR MODERATE (A) 02/13/2023 1725    Radiological Exams on Admission: DG Foot 2 Views Left  Result Date: 02/18/2023 CLINICAL DATA:  Foot pain. EXAM: LEFT FOOT - 2 VIEW COMPARISON:  None Available. FINDINGS: Subjective osteopenia/osteoporosis. No fracture or dislocation. There are hammertoe deformities of the fourth and fifth toes. No erosive change or periostitis. No significant arthropathy. Small plantar calcaneal spur. Surgical screw in the distal fibula. Mild soft tissue edema. No soft  tissue gas or radiopaque foreign body. IMPRESSION: 1. Mild soft tissue edema. No acute osseous abnormality. 2. Subjective osteopenia/osteoporosis. Electronically Signed   By: Narda Rutherford M.D.   On: 02/18/2023 18:57   DG Chest Port 1 View  Result Date: 02/18/2023 CLINICAL DATA:  Chest pain. EXAM: PORTABLE CHEST 1 VIEW COMPARISON:  Radiograph 02/13/2023 FINDINGS: Patient is rotated. Stable heart size and mediastinal contours. Postsurgical change in the right lung. Stable linear atelectasis/scarring at the left lung base. No acute airspace disease, large pleural effusion or pneumothorax. On limited assessment, no acute osseous findings. IMPRESSION: Stable radiographic appearance of the chest.  No acute findings. Electronically Signed   By: Narda Rutherford M.D.   On: 02/18/2023 18:55    EKG: Independently reviewed.  Sinus rhythm, rate 94, QTc 426.  No significant change in V2 through V4.  Assessment/Plan Principal Problem:   UTI due to extended-spectrum beta lactamase (ESBL) producing Escherichia coli Active Problems:   Controlled type 2 diabetes mellitus with hyperglycemia (HCC)   DVT (deep venous thrombosis) 09/08/22  Assessment and Plan: * UTI due to extended-spectrum beta lactamase (ESBL) producing Escherichia coli Recent hospitalization for ESBL UTI and bacteremia, septic shock.  Patient was to complete IV ertapenem through 10/9.  She was in the ED yesterday and today again with problems with her PICC line.  Per nursing home staff patient was picking on the PICC line.  She is afebrile without leukocytosis and appears to have improved clinically. -May just require hospitalization to complete the full course of antibiotics -IV meropenem  HTN (hypertension) Stable.  Not sure why home med list has midodrine, atenolol, HCTZ. -Resume midodrine and atenolol for now.  DVT (deep venous thrombosis) 09/08/22 Resume Eliquis  Controlled type 2 diabetes mellitus with hyperglycemia  (HCC) Uncontrolled.  A1c 10.9.   - Blood glucose 263. - SSI- M -Hold home metformin while inpatient   The rest of patient's medical problems appear stable.  DVT prophylaxis: Eliquis Code Status: DNR.   I confirmed with patient at bedside.  Documentation from nursing home at bedside states DNR. Family Communication: None at bedside Disposition Plan: ~ 4 days Consults called: None Admission status:  Obs med surg    Author: Onnie Boer, MD 02/18/2023 10:40 PM  For on call review [email or web address].

## 2023-02-18 NOTE — ED Provider Notes (Signed)
[place] EMERGENCY DEPARTMENT AT [facility] Provider Note   CSN: [id number] Arrival date & time: 02/18/23  1755     History  Chief Complaint  Patient presents with   Vascular Access Problem    [name] is a 67 y.o. female with past medical history significant for lung cancer, bipolar disorder, history of drug abuse, depression, acid reflux, sepsis, UTI who is currently receiving IV ertapenem for urinary tract infection is resistant to oral antibiotics.  She presents by EMS for PICC line issues, her care facility reports that it is "not working right".  She is additionally endorsing some left-sided chest pain as well as left-sided foot pain.  Patient reports that the nurses "dragged me across the street back to her care facility and injured my foot".  She denies any shortness of breath.  She reports she takes chronic pain medication and has not had it in several hours.  HPI     Home Medications Prior to Admission medications   Medication Sig Start Date End Date Taking? Authorizing Provider  acetaminophen (TYLENOL) 325 MG tablet Take 650 mg by mouth every 4 (four) hours as needed for mild pain.   Yes [provider]  ascorbic acid (VITAMIN C) 500 MG tablet Take 500 mg by mouth 2 (two) times daily.   Yes [provider]  atenolol (TENORMIN) 25 MG tablet Take 1 tablet (25 mg total) by mouth daily. 02/16/23 02/16/24 Yes Emokpae, Courage, MD  busPIRone (BUSPAR) 5 MG tablet Take 5 mg by mouth 2 (two) times daily. 01/14/23  Yes [provider]  calcium carbonate (TUMS - DOSED IN MG ELEMENTAL CALCIUM) 500 MG chewable tablet Chew 1 tablet by mouth every 5 (five) hours as needed for indigestion or heartburn.   Yes [provider]  cetirizine (ZYRTEC) 10 MG tablet Take 10 mg by mouth daily. For allergies   Yes [provider]  ELIQUIS 5 MG TABS tablet Take 5 mg by mouth 2 (two) times daily. 01/18/23  Yes [provider]  ertapenem  (INVANZ) IVPB Inject 1 g into the vein daily for 6 days. Indication:  ESBL Bacteremia First Dose: Yes Last Day of Therapy:  02/21/23  Labs - Once weekly:  CBC/D and BMP, Labs - Once weekly: ESR and CRP 02/16/23 02/22/23 Yes Emokpae, Courage, MD  folic acid (FOLVITE) 1 MG tablet Take 1 mg by mouth daily.   Yes [provider]  gabapentin (NEURONTIN) 100 MG capsule Take 300 mg by mouth 3 (three) times daily. 07/20/21  Yes [provider]  Heparin Sod, Pork, Lock Flush (HEPARIN LOCK FLUSH IV) Inject 5 mLs into the vein daily. Heparin Flush Solution 5 mls of 10 units/ml; use for ABT PICC line flush after medication administration   Yes [provider]  hydrochlorothiazide (HYDRODIURIL) 25 MG tablet Take 0.5 tablets (12.5 mg total) by mouth daily. 02/16/23  Yes Shon Hale, MD  hydrOXYzine (ATARAX) 25 MG tablet Take 1 tablet (25 mg total) by mouth every 6 (six) hours as needed for itching. 08/27/22  Yes Jacalyn Lefevre, MD  insulin aspart (FIASP FLEXTOUCH) 100 UNIT/ML FlexTouch Pen Inject 0-10 Units into the skin 4 (four) times daily -  before meals and at bedtime. Short acting insulin (Aspart) per sliding scale 0-10 ---:-- Insulin injection 0-10 Units 0-10 Units Subcutaneous, 3 times daily with meals CBG 70 - 120: 0 unit CBG 121 - 150: 0 unit  CBG 151 - 200: 1 unit CBG 201 - 250: 2  units CBG 251 - 300: 4 units CBG 301 - 350: 6 units  CBG 351 - 400: 8 units  CBG > 400: 10 units 02/16/23  Yes Emokpae, Courage, MD  Ketoconazole 2 % FOAM Apply 1 Application topically daily. To scalp, for seborrheic dermatitis. 01/12/23  Yes [provider]  lactulose (CHRONULAC) 10 GM/15ML solution Take 30 mLs by mouth daily as needed for moderate constipation. 01/18/23  Yes [provider]  lidocaine (LIDODERM) 5 % Place 1 patch onto the skin daily. Remove & Discard patch within 12 hours or as directed by MD; Apply to LOWER LEG   Yes [provider]  lidocaine 4 % dressing  Apply 1 Application topically every 12 (twelve) hours as needed (pain).   Yes [provider]  lithium carbonate 300 MG capsule Take 600 mg by mouth daily. 11/24/22  Yes [provider]  loperamide (IMODIUM A-D) 2 MG tablet Take 2 mg by mouth every 6 (six) hours as needed for diarrhea or loose stools.   Yes [provider]  LORazepam (ATIVAN) 0.5 MG tablet Take 1 tablet (0.5 mg total) by mouth at bedtime as needed for anxiety or sleep. 02/16/23  Yes Emokpae, Courage, MD  Melatonin 10 MG TABS Take 10 mg by mouth at bedtime.   Yes [provider]  metFORMIN (GLUCOPHAGE) 500 MG tablet Take 1 pill a day Patient taking differently: Take 500 mg by mouth daily with breakfast. Take 1 pill a day 12/17/22  Yes Bethann Berkshire, MD  methimazole (TAPAZOLE) 5 MG tablet Take 1 tablet (5 mg total) by mouth 2 (two) times daily. 02/16/23  Yes Emokpae, Courage, MD  midodrine (PROAMATINE) 5 MG tablet Take 1 tablet (5 mg total) by mouth 3 (three) times daily with meals. 02/16/23  Yes Emokpae, Courage, MD  Multiple Vitamin (MULTIVITAMIN ADULT PO) Take 1 tablet by mouth daily.   Yes [provider]  OLANZapine (ZYPREXA) 10 MG tablet Take 10 mg by mouth at bedtime. 01/31/23  Yes [provider]  ondansetron (ZOFRAN) 4 MG tablet Take 4 mg by mouth every 8 (eight) hours as needed. 12/14/22  Yes [provider]  oxyCODONE-acetaminophen (PERCOCET) 10-325 MG tablet Take 1 tablet by mouth every 4 (four) hours as needed. 02/16/23  Yes Emokpae, Courage, MD  potassium chloride 20 MEQ/15ML (10%) SOLN Take 10 mEq by mouth daily. 12/20/22  Yes [provider]  pyrithione zinc (SELSUN BLUE ITCHY DRY SCALP) 1 % shampoo Apply topically daily as needed for itching. Patient taking differently: Apply 1 Application topically daily as needed for itching. 08/27/22  Yes Jacalyn Lefevre, MD  saccharomyces boulardii (FLORASTOR) 250 MG capsule Take 250 mg by mouth daily.   Yes [provider]  senna-docusate (SENOKOT-S) 8.6-50 MG tablet Take 2 tablets by mouth 2 (two) times daily. May hold for loose stools   Yes [provider]  tamsulosin (FLOMAX) 0.4 MG CAPS capsule Take 0.4 mg by mouth daily.   Yes [provider]  zolpidem (AMBIEN) 5 MG tablet Take 1 tablet (5 mg total) by mouth at bedtime as needed for sleep. 02/16/23  Yes Shon Hale, MD      Allergies    Sulfa antibiotics    Review of Systems   Review of Systems  All other systems reviewed and are negative.   Physical Exam Updated Vital Signs BP 107/87   Pulse 97   Temp 98.3 F (36.8 C) (Oral)   Resp (!) 21   Ht 5\' 9"  (1.753  m)   Wt 102.1 kg   SpO2 95%   BMI 33.24 kg/m  Physical Exam Vitals and nursing note reviewed.  Constitutional:      General: She is not in acute distress.    Appearance: Normal appearance.  HENT:     Head: Normocephalic and atraumatic.  Eyes:     General:        Right eye: No discharge.        Left eye: No discharge.  Cardiovascular:     Rate and Rhythm: Normal rate and regular rhythm.     Pulses: Normal pulses.     Heart sounds: No murmur heard.    No friction rub. No gallop.  Pulmonary:     Effort: Pulmonary effort is normal.     Breath sounds: Normal breath sounds.  Abdominal:     General: Bowel sounds are normal.     Palpations: Abdomen is soft.  Musculoskeletal:     Comments: Some tenderness to palpation of the left foot with no step-off, deformity, or abnormality noted on exam  Skin:    General: Skin is warm and dry.     Capillary Refill: Capillary refill takes less than 2 seconds.  Neurological:     Mental Status: She is alert and oriented to person, place, and time.  Psychiatric:        Mood and Affect: Mood normal.        Behavior: Behavior normal.     ED Results / Procedures / Treatments   Labs (all labs ordered are listed, but only abnormal results are displayed) Labs Reviewed  BASIC METABOLIC PANEL - Abnormal;  Notable for the following components:      Result Value   Sodium 134 (*)    Chloride 97 (*)    Glucose, Bld 263 (*)    Calcium 8.7 (*)    All other components within normal limits  CBC  TROPONIN I (HIGH SENSITIVITY)  TROPONIN I (HIGH SENSITIVITY)    EKG None  Radiology DG Foot 2 Views Left  Result Date: 02/18/2023 CLINICAL DATA:  Foot pain. EXAM: LEFT FOOT - 2 VIEW COMPARISON:  None Available. FINDINGS: Subjective osteopenia/osteoporosis. No fracture or dislocation. There are hammertoe deformities of the fourth and fifth toes. No erosive change or periostitis. No significant arthropathy. Small plantar calcaneal spur. Surgical screw in the distal fibula. Mild soft tissue edema. No soft tissue gas or radiopaque foreign body. IMPRESSION: 1. Mild soft tissue edema. No acute osseous abnormality. 2. Subjective osteopenia/osteoporosis. Electronically Signed   By: Narda Rutherford M.D.   On: 02/18/2023 18:57   DG Chest Port 1 View  Result Date: 02/18/2023 CLINICAL DATA:  Chest pain. EXAM: PORTABLE CHEST 1 VIEW COMPARISON:  Radiograph 02/13/2023 FINDINGS: Patient is rotated. Stable heart size and mediastinal contours. Postsurgical change in the right lung. Stable linear atelectasis/scarring at the left lung base. No acute airspace disease, large pleural effusion or pneumothorax. On limited assessment, no acute osseous findings. IMPRESSION: Stable radiographic appearance of the chest.  No acute findings. Electronically Signed   By: Narda Rutherford M.D.   On: 02/18/2023 18:55    Procedures Procedures    Medications Ordered in ED Medications  meropenem (MERREM) 1 g in sodium chloride 0.9 % 100 mL IVPB (has no administration in time range)  atenolol (TENORMIN) tablet 25 mg (has no administration in time range)  busPIRone (BUSPAR) tablet 5 mg (has no administration in time range)  apixaban (ELIQUIS) tablet 5 mg (has no administration in  time range)  gabapentin (NEURONTIN) capsule 300 mg (has no  administration in time range)  lactulose (CHRONULAC) 10 GM/15ML solution 20 g (has no administration in time range)  lithium carbonate capsule 600 mg (has no administration in time range)  LORazepam (ATIVAN) tablet 0.5 mg (has no administration in time range)  melatonin tablet 9 mg (has no administration in time range)  methimazole (TAPAZOLE) tablet 5 mg (has no administration in time range)  midodrine (PROAMATINE) tablet 5 mg (has no administration in time range)  OLANZapine (ZYPREXA) tablet 10 mg (has no administration in time range)  saccharomyces boulardii (FLORASTOR) capsule 250 mg (has no administration in time range)  tamsulosin (FLOMAX) capsule 0.4 mg (has no administration in time range)  oxyCODONE-acetaminophen (PERCOCET/ROXICET) 5-325 MG per tablet 1 tablet (has no administration in time range)    And  oxyCODONE (Oxy IR/ROXICODONE) immediate release tablet 5 mg (has no administration in time range)  insulin aspart (novoLOG) injection 0-15 Units (has no administration in time range)  insulin aspart (novoLOG) injection 0-5 Units (has no administration in time range)  oxyCODONE-acetaminophen (PERCOCET/ROXICET) 5-325 MG per tablet 2 tablet (2 tablets Oral Given 02/18/23 1843)    ED Course/ Medical Decision Making/ A&P                                 Medical Decision Making Amount and/or Complexity of Data Reviewed Labs: ordered. Radiology: ordered.  Risk Prescription drug management.   This patient is a 67 y.o. female  who presents to the ED for concern of chest pain, foot pain, with primary concern for clogged PICC line on left chest placed yesterday, patient has 3 days left for ertapenem secondary to ESBL resistant UTI during recent admission, discharged 2 days ago.   Differential diagnoses prior to evaluation: The emergent differential diagnosis includes, but is not limited to,  ACS, AAS, PE, Mallory-Weiss, Boerhaave's, Pneumonia, acute bronchitis, asthma or COPD  exacerbation, anxiety, MSK pain or traumatic injury to the chest, acid reflux versus other, consider fracture, dislocation versus soft tissue injury of the affected left foot. This is not an exhaustive differential.   Past Medical History / Co-morbidities / Social History: Diabetes, hypertension, lung cancer, bipolar disorder  Additional history: Chart reviewed. Pertinent results include: Extensively reviewed lab work, imaging from previous emergency department visits, patient was recently admitted with ESBL resistant UTI, discharged on ertapenem with PICC line, but has lost access twice, midline placed yesterday and not functioning today.  Physical Exam: Physical exam performed. The pertinent findings include: She is overall well-appearing, she has a wrap on her right foot, she reports history of foot drop.  She has some tenderness over the left foot without step-off, deformity.  Vital signs stable in the emergency department, PICC line in left upper extremity nonfunctional.  Lab Tests/Imaging studies: I personally interpreted labs/imaging and the pertinent results include: BMP notable for hyperglycemia, glucose 263, pseudohyponatremia, sodium 134.  CBC unremarkable, initial troponin less than 2.  [place] interpreted plain film radiograph of the left foot as well as chest which shows no evidence of acute intrathoracic abnormality, no evidence of fracture or dislocation of the left foot.. I agree with the radiologist interpretation.  Cardiac monitoring: EKG obtained and interpreted by myself and attending physician which shows: NSR   Medications: I ordered medication including home oxycodone for pain.  I have reviewed the patients home medicines and have made adjustments as needed.   Consults:  I spoke with the hospitalist, Dr. Mariea Clonts, and after discussion patient's case, we reached a decision that hospital admission for peripheral IV administration of her meropenem for the next 3 days to  complete her course is more reasonable than trying to transfer for PICC line replacement and then getting her back to her care facility.  No evidence of new acute abnormality today.  No evidence of return of sepsis.  Disposition: After consideration of the diagnostic results and the patients response to treatment, I feel that patient would benefit from admission for above.   Final Clinical Impression(s) / ED Diagnoses Final diagnoses:  None    Rx / DC Orders ED Discharge Orders     None         [place] 02/18/23 2222    Pricilla Loveless, MD 02/26/23 [phone]

## 2023-02-18 NOTE — Assessment & Plan Note (Signed)
Recent hospitalization for ESBL UTI and bacteremia, septic shock.  Patient was to complete IV ertapenem through 10/9.  She was in the ED yesterday and today again with problems with her PICC line.  Per nursing home staff patient was picking on the PICC line.  She is afebrile without leukocytosis and appears to have improved clinically. -May just require hospitalization to complete the full course of antibiotics -IV meropenem

## 2023-02-18 NOTE — ED Notes (Signed)
Notified [facility] of patient needing transportation back to [facility] care facility.

## 2023-02-19 ENCOUNTER — Encounter (HOSPITAL_COMMUNITY): Payer: Self-pay | Admitting: Internal Medicine

## 2023-02-19 DIAGNOSIS — E0590 Thyrotoxicosis, unspecified without thyrotoxic crisis or storm: Secondary | ICD-10-CM | POA: Diagnosis present

## 2023-02-19 DIAGNOSIS — E669 Obesity, unspecified: Secondary | ICD-10-CM | POA: Diagnosis present

## 2023-02-19 DIAGNOSIS — F314 Bipolar disorder, current episode depressed, severe, without psychotic features: Secondary | ICD-10-CM | POA: Diagnosis present

## 2023-02-19 DIAGNOSIS — Z66 Do not resuscitate: Secondary | ICD-10-CM | POA: Diagnosis present

## 2023-02-19 DIAGNOSIS — Z86718 Personal history of other venous thrombosis and embolism: Secondary | ICD-10-CM | POA: Diagnosis not present

## 2023-02-19 DIAGNOSIS — Z7984 Long term (current) use of oral hypoglycemic drugs: Secondary | ICD-10-CM | POA: Diagnosis not present

## 2023-02-19 DIAGNOSIS — Z803 Family history of malignant neoplasm of breast: Secondary | ICD-10-CM | POA: Diagnosis not present

## 2023-02-19 DIAGNOSIS — E876 Hypokalemia: Secondary | ICD-10-CM | POA: Diagnosis not present

## 2023-02-19 DIAGNOSIS — Z7901 Long term (current) use of anticoagulants: Secondary | ICD-10-CM | POA: Diagnosis not present

## 2023-02-19 DIAGNOSIS — G8929 Other chronic pain: Secondary | ICD-10-CM | POA: Diagnosis present

## 2023-02-19 DIAGNOSIS — Z800 Family history of malignant neoplasm of digestive organs: Secondary | ICD-10-CM | POA: Diagnosis not present

## 2023-02-19 DIAGNOSIS — Z79890 Hormone replacement therapy (postmenopausal): Secondary | ICD-10-CM | POA: Diagnosis not present

## 2023-02-19 DIAGNOSIS — E1165 Type 2 diabetes mellitus with hyperglycemia: Secondary | ICD-10-CM | POA: Diagnosis present

## 2023-02-19 DIAGNOSIS — G2581 Restless legs syndrome: Secondary | ICD-10-CM | POA: Diagnosis present

## 2023-02-19 DIAGNOSIS — B9629 Other Escherichia coli [E. coli] as the cause of diseases classified elsewhere: Secondary | ICD-10-CM | POA: Diagnosis not present

## 2023-02-19 DIAGNOSIS — F17210 Nicotine dependence, cigarettes, uncomplicated: Secondary | ICD-10-CM | POA: Diagnosis present

## 2023-02-19 DIAGNOSIS — B9620 Unspecified Escherichia coli [E. coli] as the cause of diseases classified elsewhere: Secondary | ICD-10-CM | POA: Diagnosis present

## 2023-02-19 DIAGNOSIS — Z1612 Extended spectrum beta lactamase (ESBL) resistance: Secondary | ICD-10-CM | POA: Diagnosis present

## 2023-02-19 DIAGNOSIS — T82594A Other mechanical complication of infusion catheter, initial encounter: Secondary | ICD-10-CM | POA: Diagnosis present

## 2023-02-19 DIAGNOSIS — G4700 Insomnia, unspecified: Secondary | ICD-10-CM | POA: Diagnosis present

## 2023-02-19 DIAGNOSIS — Z6833 Body mass index (BMI) 33.0-33.9, adult: Secondary | ICD-10-CM | POA: Diagnosis not present

## 2023-02-19 DIAGNOSIS — N390 Urinary tract infection, site not specified: Secondary | ICD-10-CM | POA: Diagnosis present

## 2023-02-19 DIAGNOSIS — M21371 Foot drop, right foot: Secondary | ICD-10-CM | POA: Diagnosis present

## 2023-02-19 DIAGNOSIS — I10 Essential (primary) hypertension: Secondary | ICD-10-CM | POA: Diagnosis present

## 2023-02-19 DIAGNOSIS — Z833 Family history of diabetes mellitus: Secondary | ICD-10-CM | POA: Diagnosis not present

## 2023-02-19 DIAGNOSIS — A499 Bacterial infection, unspecified: Secondary | ICD-10-CM | POA: Diagnosis present

## 2023-02-19 DIAGNOSIS — Y712 Prosthetic and other implants, materials and accessory cardiovascular devices associated with adverse incidents: Secondary | ICD-10-CM | POA: Diagnosis present

## 2023-02-19 LAB — BASIC METABOLIC PANEL
Anion gap: 7 (ref 5–15)
BUN: 9 mg/dL (ref 8–23)
CO2: 31 mmol/L (ref 22–32)
Calcium: 8.7 mg/dL — ABNORMAL LOW (ref 8.9–10.3)
Chloride: 98 mmol/L (ref 98–111)
Creatinine, Ser: 0.46 mg/dL (ref 0.44–1.00)
GFR, Estimated: >60 mL/min (ref >60)
Glucose, Bld: 250 mg/dL — ABNORMAL HIGH (ref 70–99)
Potassium: 3.2 mmol/L — ABNORMAL LOW (ref 3.5–5.1)
Sodium: 136 mmol/L (ref 135–145)

## 2023-02-19 LAB — CBC
HCT: 37.6 % (ref 36.0–46.0)
Hemoglobin: 12.3 g/dL (ref 12.0–15.0)
MCH: 29.8 pg (ref 26.0–34.0)
MCHC: 32.7 g/dL (ref 30.0–36.0)
MCV: 91 fL (ref 80.0–100.0)
Platelets: 208 10*3/uL (ref 150–400)
RBC: 4.13 MIL/uL (ref 3.87–5.11)
RDW: 13.1 % (ref 11.5–15.5)
WBC: 4.3 10*3/uL (ref 4.0–10.5)
nRBC: 0 % (ref 0.0–0.2)

## 2023-02-19 LAB — GLUCOSE, CAPILLARY
Glucose-Capillary: 263 mg/dL — ABNORMAL HIGH (ref 70–99)
Glucose-Capillary: 284 mg/dL — ABNORMAL HIGH (ref 70–99)
Glucose-Capillary: 110 mg/dL — ABNORMAL HIGH (ref 70–99)
Glucose-Capillary: 159 mg/dL — ABNORMAL HIGH (ref 70–99)

## 2023-02-19 MED ORDER — ONDANSETRON HCL 4 MG PO TABS: 4.0000 mg | ORAL_TABLET | Freq: Four times a day (QID) | ORAL | Status: DC | PRN

## 2023-02-19 MED ORDER — ONDANSETRON HCL 4 MG/2ML IJ SOLN
4.0000 mg | Freq: Four times a day (QID) | INTRAMUSCULAR | Status: DC | PRN
  Filled 2023-02-19: qty 2

## 2023-02-19 MED ORDER — POTASSIUM CHLORIDE CRYS ER 20 MEQ PO TBCR
40.0000 meq | EXTENDED_RELEASE_TABLET | ORAL | Status: AC
  Administered 2023-02-19 (×2): 40 meq via ORAL
  Filled 2023-02-19 (×2): qty 2

## 2023-02-19 MED ORDER — ACETAMINOPHEN 650 MG RE SUPP: 650.0000 mg | Freq: Four times a day (QID) | RECTAL | Status: DC | PRN

## 2023-02-19 MED ORDER — ACETAMINOPHEN 325 MG PO TABS: 650.0000 mg | ORAL_TABLET | Freq: Four times a day (QID) | ORAL | Status: DC | PRN

## 2023-02-19 NOTE — Inpatient Diabetes Management (Signed)
Inpatient Diabetes Program Recommendations  AACE/ADA: New Consensus Statement on Inpatient Glycemic Control (2015)  Target Ranges:  Prepandial:   less than 140 mg/dL      Peak postprandial:   less than 180 mg/dL (1-2 hours)      Critically ill patients:  140 - 180 mg/dL   Lab Results  Component Value Date   GLUCAP 110 (H) 02/19/2023   HGBA1C 10.9 (H) 02/14/2023    Review of Glycemic Control  Latest Reference Range & Units 02/19/23 08:01 02/19/23 12:20 02/19/23 16:46  Glucose-Capillary 70 - 99 mg/dL 409 (H) 811 (H) 914 (H)   Diabetes history: DM 2 Outpatient Diabetes medications: Fiasp 0-10 units 4x/day, metformin 500 mg Daily Current orders for Inpatient glycemic control:  Novolog 0-15 units tid + hs  From SNF  Will return to SNF  Glucose trends coming down with Novolog correction scale. Will follow glucose trends.  Thanks,  Christena Deem RN, MSN, BC-ADM Inpatient Diabetes Coordinator Team Pager [phone] (8a-5p)

## 2023-02-19 NOTE — TOC Initial Note (Signed)
Transition of Care [facility]) - Initial/Assessment Note    Patient Details  Name: KYMIA SIMI MRN: [id number] Date of Birth: 02/17/1956  [facility] CM/SW Contact:    Leitha Bleak, RN Phone Number: 02/19/2023, 3:34 PM  Clinical Narrative:        Patient discharged 3 days ago, from [facility]. Admitted with UTI, patient has a PICC line an non - ambulatory. TOC following.            Expected Discharge Plan: Skilled Nursing Facility Barriers to Discharge: Continued Medical Work up   Patient Goals and CMS Choice Patient states their goals for this hospitalization and ongoing recovery are:: return to [facility].gov Compare Post Acute Care list provided to:: Patient Choice offered to / list presented to : Patient     Expected Discharge Plan and Services      Living arrangements for the past 2 months: Skilled Nursing Facility                Prior Living Arrangements/Services Living arrangements for the past 2 months: Skilled Nursing Facility Lives with:: Facility Resident     Activities of Daily Living   ADL Screening (condition at time of admission) Independently performs ADLs?: No Does the patient have a NEW difficulty with bathing/dressing/toileting/self-feeding that is expected to last >3 days?: No Does the patient have a NEW difficulty with getting in/out of bed, walking, or climbing stairs that is expected to last >3 days?: No Does the patient have a NEW difficulty with communication that is expected to last >3 days?: No Is the patient deaf or have difficulty hearing?: No Does the patient have difficulty seeing, even when wearing glasses/contacts?: No Does the patient have difficulty concentrating, remembering, or making decisions?: No  Permission Sought/Granted                  Emotional Assessment              Admission diagnosis:  ESBL (extended spectrum beta-lactamase) producing bacteria infection [A49.9,  Z16.12] UTI due to extended-spectrum beta lactamase (ESBL) producing Escherichia coli [N39.0, B96.29, Z16.12] UTI (urinary tract infection) [N39.0] Patient Active Problem List   Diagnosis Date Noted   UTI due to extended-spectrum beta lactamase (ESBL) producing Escherichia coli 02/18/2023   HTN (hypertension) 02/18/2023   Septic shock (HCC) 02/14/2023   Sepsis (HCC) 02/14/2023   Controlled type 2 diabetes mellitus with hyperglycemia (HCC) 02/14/2023   DVT (deep venous thrombosis) 09/08/22 09/08/2022   Acute left ankle pain 12/13/2021   UTI (urinary tract infection) 08/03/2021   Hypernatremia 07/30/2021   Acute urinary retention 07/30/2021   Hypokalemia 07/30/2021   Hypotension 07/29/2021   Ankle wound, left 07/29/2021   Severe sepsis (HCC) 07/28/2021   Dyslipidemia 07/28/2021   Acute metabolic encephalopathy 07/28/2021   MDD (major depressive disorder), recurrent severe, 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 [place] [address] Phone: [phone] Fax: [phone]  [facility] [place] [facility] [place], [place] - [address]  [place] [address] Phone: [phone] Fax: [phone]   Social Determinants of Health (SDOH) Social History: SDOH Screenings   Food Insecurity: No Food Insecurity (02/18/2023)  Housing: Low Risk  (02/18/2023)  Transportation Needs: No Transportation Needs (02/18/2023)  Utilities: Not At Risk (02/18/2023)  Alcohol Screen: Low Risk  (11/14/2017)  Social Connections: Unknown  (07/05/2021)   Received from [facility]  Tobacco Use: Medium Risk (02/19/2023)   SDOH Interventions:     Readmission Risk Interventions    02/14/2023    9:57 AM 08/03/2021    1:25 PM  Readmission Risk Prevention Plan  Transportation Screening Complete Complete  PCP or Specialist Appt within 5-7 Days  Complete  Home Care Screening  Complete  Medication Review (RN CM)  Complete  Medication Review (RN Care Manager) Complete   HRI or Home Care Consult Complete   SW Recovery Care/Counseling Consult Complete   Palliative Care Screening Not Applicable   Skilled Nursing Facility Complete

## 2023-02-19 NOTE — Progress Notes (Signed)
Has been yelling at staff multiple times throughout shift when answering call light and has been accusing staff of refusing to answer her call light, calling staff names, and accusing nurse of with holding pain medication because "I normally take it whenever I need it even if it's every couple hours".  Has removed external urinary catheter when staff unable to immediately answer her call light and when asked what happened, states "I pulled it because you wouldn't get in here".  When patient reports she has had a bowel movement, states "I can't roll from side to side" when single staff member in room and then when told that a second staff member needs to be present to assist, then states "I can roll, I just didn't want to".  When she has had the external cath replaced and has been cleaned up, accused staff of intentionally "pinching and crushing my boob" and pinching staff's arm on that side asking "how does that feel" and then apologizes immediately afterwards for "accusing you of hurting me".  Repeatedly refuses to remain on either side, demands to have her legs flat and on the bed, and accusing staff of "neglecting to answer my call light".  Has been on call light multiple times throughout shift to have staff change TV channel, hand her the cup of water, pull the blanket up, talk with her, kicking covers off, scooting to the end and edge of the bed, wanting help with her cell phone despite talking/texting on it throughout the shift, and to alert staff that the external cath "fell out" or she "pulled it when trying to work the cell phone".

## 2023-02-19 NOTE — Progress Notes (Signed)
PROGRESS NOTE [name]  XBM:[id number] DOB: 12/05/1955 DOA: 02/18/2023 PCP: Patient, [name] Per  Brief Narrative/Hospital Course: 67 y.o.f w/ history of BPD, Drug abuse, DM who was sent to the ED  due to problems with her PICC line. Patient was recently hospitalized 10/1 to 10/4 for septic shock secondary to ESBL E. coli UTI and bacteremia, with hyperglycemia, and encephalopathy., and was treated with IV meropenem > discharged on Invanz via PICC line, last dose was to be 02/21/23.  She was seen in the ED 1 day PTA with PICC line problem-new PICC line was placed and discharged back Again PICC line not functioning so sent to the ED . As per Nursing home staff, patient was picking on the PICC line. She reports acute on chronic pain to her left foot, due to hitting it while on the wheelchair.  She otherwise has no other complaints.  In ED: Alert awake oriented vitals are stable stable vitals.  Chest x-ray negative for acute abnormality.  Left foot x-ray shows mild soft tissue edema. Hospitalist was requested to admit the patient to complete antibiotics.    Subjective: Seen and examined this morning Alert awake oriented not in distress complaints of chronic pain and requesting for pain meds No other issues has chronic constipation but no complaint   Assessment and Plan: Principal Problem:   UTI due to extended-spectrum beta lactamase (ESBL) producing Escherichia coli Active Problems:   Controlled type 2 diabetes mellitus with hyperglycemia (HCC)   DVT (deep venous thrombosis) 09/08/22   HTN (hypertension)   ESBL E. coli UTI POA: Recent hospitalization for septic shock UTI and on IV Invanz THROUGH 10/9. She is admitted here with PICC line problem, will continue hospitalization until antibiotics completed.Monitor labs   HTN Hypertension: BP Soft at times.Not sure why home med list has midodrine, atenolol, HCTZ.  Holding antihypertensive continue midodrine alone   DVT  Cont  Eliquis  Hypokalemia: Will replace   Diabetes mellitus with uncontrolled A1c and uncontrolled hyperglycemia  A1c 10.9.  Continue SSI holding metformin for now  Hyperthyroidism: Continue her methimazole.  She was recently started on it on 10/2.  She has to follow-up with endocrinology as outpatient as per previous plan  Restless leg syndrome Insomnia Severe bipolar 1 disorder: Mood stable, continue on BuSpar, olanzapine, lithium, Neurontin  Chronically bedbound-nursing home resident last walk was a year ago Left shoulder pain chronic Chronic right foot drop: Will need outpatient orthopedic referral  Obesity:Patient's Body mass index is 33.24 kg/m. : Will benefit with PCP follow-up, weight loss  healthy lifestyle and outpatient sleep evaluation.   DVT prophylaxis:  Code Status:   Code Status: Do not attempt resuscitation (DNR) PRE-ARREST INTERVENTIONS DESIRED Family Communication: plan of care discussed with patient at bedside. Patient status is: Inpatient because of UTI Level of care: Med-Surg   Dispo: The patient is from: SNF            Anticipated disposition: SNF after completing IV antibiotics  Objective: Vitals last 24 hrs: Vitals:   02/19/23 0145 02/19/23 0231 02/19/23 0232 02/19/23 0624  BP: 97/81  (!) 86/45 (!) 107/52  Pulse: 80  86 74  Resp: 15  16 16   Temp:   98.6 F (37 C) 98.2 F (36.8 C)  TempSrc:   Oral Axillary  SpO2: 96% 100% 100% 93%  Weight:      Height:       Weight change:   Physical Examination: General exam: alert awake, older than stated age HEENT:Oral  mucosa moist, Ear/Nose WNL grossly Respiratory system: bilaterally clear BS, no use of accessory muscle Cardiovascular system: S1 & S2 +, No JVD. Gastrointestinal system: Abdomen soft,NT,ND, BS+ Nervous System:Alert, awake, moving upper symmetries well, chronic condition legs  Extremities: LE edema +,distal peripheral pulses palpable.  Skin: No rashes,no icterus. MSK: Normal muscle  bulk,tone, power  Medications reviewed:  Scheduled Meds:  apixaban  5 mg Oral BID   busPIRone  5 mg Oral BID   gabapentin  300 mg Oral TID   insulin aspart  0-15 Units Subcutaneous TID WC   insulin aspart  0-5 Units Subcutaneous QHS   lithium carbonate  600 mg Oral Daily   melatonin  9 mg Oral QHS   methimazole  5 mg Oral BID   midodrine  5 mg Oral TID WC   OLANZapine  10 mg Oral QHS   potassium chloride  40 mEq Oral Q3H   saccharomyces boulardii  250 mg Oral Daily   tamsulosin  0.4 mg Oral Daily   Continuous Infusions:  meropenem (MERREM) IV 1 g (02/19/23 0530)      Diet Order             Diet Carb Modified Fluid consistency: Thin; Room service appropriate? Yes  Diet effective now                            Intake/Output Summary (Last 24 hours) at 02/19/2023 1030 Last data filed at 02/19/2023 0954 Gross per 24 hour  Intake 590 ml  Output --  Net 590 ml   Net IO Since Admission: 590 mL [02/19/23 1030]  Wt Readings from Last 3 Encounters:  02/18/23 102.1 kg  02/17/23 102.1 kg  02/16/23 102.1 kg     Unresulted Labs (From admission, onward)     Start     Ordered   02/20/23 0500  Basic metabolic panel  Daily,   R      02/19/23 0911          Data Reviewed: I have personally reviewed following labs and imaging studies CBC: Recent Labs  Lab 02/13/23 1504 02/14/23 0510 02/15/23 0319 02/16/23 0503 02/18/23 1851 02/19/23 0559  WBC 9.9 12.0* 6.2 3.8* 5.6 4.3  NEUTROABS 8.7* 10.0*  --   --   --   --   HGB 15.8* 12.8 11.9* 11.6* 13.0 12.3  HCT 47.8* 40.0 36.6 37.3 40.5 37.6  MCV 90.9 92.6 93.6 95.9 92.3 91.0  PLT 145* 117* 105* 106* 200 208   Basic Metabolic Panel: Recent Labs  Lab 02/14/23 0510 02/14/23 0841 02/15/23 0319 02/16/23 0503 02/18/23 1851 02/19/23 0559  NA 138  --  139 138 134* 136  K 3.0*  --  3.3* 3.6 3.7 3.2*  CL 104  --  108 108 97* 98  CO2 24  --  24 24 27 31   GLUCOSE 221*  --  192* 188* 263* 250*  BUN 9  --  7* 6* 9 9   CREATININE 0.46  --  0.48 0.43* 0.50 0.46  CALCIUM 8.5*  --  8.3* 8.2* 8.7* 8.7*  MG 1.6*  --  1.9 2.0  --   --   PHOS  --  1.5* 2.2* 2.2*  --   --    GFR: Estimated Creatinine Clearance: 89.2 mL/min (by C-G formula based on SCr of 0.46 mg/dL). Liver Function Tests: Recent Labs  Lab 02/13/23 1504 02/14/23 0510 02/16/23 0503  AST 23 15  --  ALT 30 22  --   ALKPHOS 99 72  --   BILITOT 1.7* 0.7  --   PROT 7.2 5.8*  --   ALBUMIN 3.6 2.8* 2.4*   Recent Labs  Lab 02/13/23 1504  LIPASE 19   Recent Labs  Lab 02/14/23 0510  AMMONIA 25   Coagulation Profile: Recent Labs  Lab 02/13/23 1504  INR 1.3*   BNP (last 3 results) No results for input(s): "PROBNP" in the last 8760 hours. HbA1C: No results for input(s): "HGBA1C" in the last 72 hours. CBG: Recent Labs  Lab 02/16/23 0323 02/16/23 0756 02/16/23 1143 02/18/23 2252 02/19/23 0801  GLUCAP 175* 175* 237* 281* 263*   Lipid Profile: No results for input(s): "CHOL", "HDL", "LDLCALC", "TRIG", "CHOLHDL", "LDLDIRECT" in the last 72 hours. Thyroid Function Tests: No results for input(s): "TSH", "T4TOTAL", "FREET4", "T3FREE", "THYROIDAB" in the last 72 hours. Sepsis Labs: Recent Labs  Lab 02/13/23 1504 02/13/23 1700 02/14/23 0841 02/14/23 1107  LATICACIDVEN 2.7* 2.0* 1.3 1.1    Recent Results (from the past 240 hour(s))  Resp panel by RT-PCR (RSV, Flu A&B, Covid) Anterior Nasal Swab     Status: None   Collection Time: 02/13/23  2:56 PM   Specimen: Anterior Nasal Swab  Result Value Ref Range Status   SARS Coronavirus 2 by RT PCR NEGATIVE NEGATIVE Final    Comment: (NOTE) SARS-CoV-2 target nucleic acids are NOT DETECTED.  The SARS-CoV-2 RNA is generally detectable in upper respiratory specimens during the acute phase of infection. The lowest concentration of SARS-CoV-2 viral copies this assay can detect is 138 copies/mL. A negative result does not preclude SARS-Cov-2 infection and should not be used as the  sole basis for treatment or other patient management decisions. A negative result may occur with  improper specimen collection/handling, submission of specimen other than nasopharyngeal swab, presence of viral mutation(s) within the areas targeted by this assay, and inadequate number of viral copies(<138 copies/mL). A negative result must be combined with clinical observations, patient history, and epidemiological information. The expected result is Negative.  Fact Sheet for Patients:  BloggerCourse.com  Fact Sheet for Healthcare Providers:  SeriousBroker.it  This test is no t yet approved or cleared by the [place] FDA and  has been authorized for detection and/or diagnosis of SARS-CoV-2 by FDA under an Emergency Use Authorization (EUA). This EUA will remain  in effect (meaning this test can be used) for the duration of the COVID-19 declaration under Section 564(b)(1) of the Act, 21 U.S.C.section 360bbb-3(b)(1), unless the authorization is terminated  or revoked sooner.       Influenza A by PCR NEGATIVE NEGATIVE Final   Influenza B by PCR NEGATIVE NEGATIVE Final    Comment: (NOTE) The Xpert Xpress SARS-CoV-2/FLU/RSV plus assay is intended as an aid in the diagnosis of influenza from Nasopharyngeal swab specimens and should not be used as a sole basis for treatment. Nasal washings and aspirates are unacceptable for Xpert Xpress SARS-CoV-2/FLU/RSV testing.  Fact Sheet for Patients: BloggerCourse.com  Fact Sheet for Healthcare Providers: SeriousBroker.it  This test is not yet approved or cleared by the [place] FDA and has been authorized for detection and/or diagnosis of SARS-CoV-2 by FDA under an Emergency Use Authorization (EUA). This EUA will remain in effect (meaning this test can be used) for the duration of the COVID-19 declaration under Section 564(b)(1) of the  Act, 21 U.S.C. section 360bbb-3(b)(1), unless the authorization is terminated or revoked.     Resp Syncytial Virus by PCR  NEGATIVE NEGATIVE Final    Comment: (NOTE) Fact Sheet for Patients: BloggerCourse.com  Fact Sheet for Healthcare Providers: SeriousBroker.it  This test is not yet approved or cleared by the [place] FDA and has been authorized for detection and/or diagnosis of SARS-CoV-2 by FDA under an Emergency Use Authorization (EUA). This EUA will remain in effect (meaning this test can be used) for the duration of the COVID-19 declaration under Section 564(b)(1) of the Act, 21 U.S.C. section 360bbb-3(b)(1), unless the authorization is terminated or revoked.  Performed at [facility], [address], [place], [place] [address]   Blood Culture (routine x 2)     Status: Abnormal   Collection Time: 02/13/23  3:04 PM   Specimen: BLOOD  Result Value Ref Range Status   Specimen Description   Final    BLOOD BLOOD RIGHT ARM Performed at [facility], [address], [place], [place] [address]    Special Requests   Final    BOTTLES DRAWN AEROBIC AND ANAEROBIC Blood Culture results may not be optimal due to an excessive volume of blood received in culture bottles Performed at [facility], [address], [place], [place] [address]    Culture  Setup Time   Final    GRAM NEGATIVE RODS IN BOTH AEROBIC AND ANAEROBIC BOTTLES CRITICAL RESULT CALLED TO, READ BACK BY AND VERIFIED WITH: CODY KINDLEY [phone] BY VIRAY, J CRITICAL RESULT CALLED TO, READ BACK BY AND VERIFIED WITH: PHARMD L.POOLE AT 1033 ON 02/14/2023 BY T.SAAD. Performed at [facility], [address], [place], [place] [address]    Culture (A)  Final    ESCHERICHIA COLI Confirmed Extended Spectrum Beta-Lactamase Producer (ESBL).  In bloodstream infections from ESBL organisms, carbapenems are preferred over piperacillin/tazobactam. They are shown to have a  lower risk of mortality.    Report Status 02/16/2023 FINAL  Final   Organism ID, Bacteria ESCHERICHIA COLI  Final      Susceptibility   Escherichia coli - MIC*    AMPICILLIN >=32 RESISTANT Resistant     CEFEPIME 16 RESISTANT Resistant     CEFTAZIDIME RESISTANT Resistant     CEFTRIAXONE >=64 RESISTANT Resistant     CIPROFLOXACIN >=4 RESISTANT Resistant     GENTAMICIN <=1 SENSITIVE Sensitive     IMIPENEM <=0.25 SENSITIVE Sensitive     TRIMETH/SULFA <=20 SENSITIVE Sensitive     AMPICILLIN/SULBACTAM >=32 RESISTANT Resistant     PIP/TAZO 8 SENSITIVE Sensitive     * ESCHERICHIA COLI  Blood Culture ID Panel (Reflexed)     Status: Abnormal   Collection Time: 02/13/23  3:04 PM  Result Value Ref Range Status   Enterococcus faecalis NOT DETECTED NOT DETECTED Final   Enterococcus Faecium NOT DETECTED NOT DETECTED Final   Listeria monocytogenes NOT DETECTED NOT DETECTED Final   Staphylococcus species NOT DETECTED NOT DETECTED Final   Staphylococcus aureus (BCID) NOT DETECTED NOT DETECTED Final   Staphylococcus epidermidis NOT DETECTED NOT DETECTED Final   Staphylococcus lugdunensis NOT DETECTED NOT DETECTED Final   Streptococcus species NOT DETECTED NOT DETECTED Final   Streptococcus agalactiae NOT DETECTED NOT DETECTED Final   Streptococcus pneumoniae NOT DETECTED NOT DETECTED Final   Streptococcus pyogenes NOT DETECTED NOT DETECTED Final   A.calcoaceticus-baumannii NOT DETECTED NOT DETECTED Final   Bacteroides fragilis NOT DETECTED NOT DETECTED Final   Enterobacterales DETECTED (A) NOT DETECTED Final    Comment: Enterobacterales represent a large order of gram negative bacteria, not a single organism. CRITICAL RESULT CALLED TO, READ  BACK BY AND VERIFIED WITH: PHARMD L.POOLE AT 1033 ON 02/14/2023 BY T.SAAD.    Enterobacter cloacae complex NOT DETECTED NOT DETECTED Final   Escherichia coli DETECTED (A) NOT DETECTED Final    Comment: CRITICAL RESULT CALLED TO, READ BACK BY AND VERIFIED  WITH: PHARMD L.POOLE AT 1033 ON 02/14/2023 BY T.SAAD.    Klebsiella aerogenes NOT DETECTED NOT DETECTED Final   Klebsiella oxytoca NOT DETECTED NOT DETECTED Final   Klebsiella pneumoniae NOT DETECTED NOT DETECTED Final   Proteus species NOT DETECTED NOT DETECTED Final   Salmonella species NOT DETECTED NOT DETECTED Final   Serratia marcescens NOT DETECTED NOT DETECTED Final   Haemophilus influenzae NOT DETECTED NOT DETECTED Final   Neisseria meningitidis NOT DETECTED NOT DETECTED Final   Pseudomonas aeruginosa NOT DETECTED NOT DETECTED Final   Stenotrophomonas maltophilia NOT DETECTED NOT DETECTED Final   Candida albicans NOT DETECTED NOT DETECTED Final   Candida auris NOT DETECTED NOT DETECTED Final   Candida glabrata NOT DETECTED NOT DETECTED Final   Candida krusei NOT DETECTED NOT DETECTED Final   Candida parapsilosis NOT DETECTED NOT DETECTED Final   Candida tropicalis NOT DETECTED NOT DETECTED Final   Cryptococcus neoformans/gattii NOT DETECTED NOT DETECTED Final   CTX-M ESBL DETECTED (A) NOT DETECTED Final    Comment: CRITICAL RESULT CALLED TO, READ BACK BY AND VERIFIED WITH: PHARMD L.POOLE AT 1033 ON 02/14/2023 BY T.SAAD. (NOTE) Extended spectrum beta-lactamase detected. Recommend a carbapenem as initial therapy.      Carbapenem resistance IMP NOT DETECTED NOT DETECTED Final   Carbapenem resistance KPC NOT DETECTED NOT DETECTED Final   Carbapenem resistance NDM NOT DETECTED NOT DETECTED Final   Carbapenem resist OXA 48 LIKE NOT DETECTED NOT DETECTED Final   Carbapenem resistance VIM NOT DETECTED NOT DETECTED Final    Comment: Performed at [facility], [address] [place], [place] [address]  Blood Culture (routine x 2)     Status: Abnormal   Collection Time: 02/13/23  3:16 PM   Specimen: BLOOD  Result Value Ref Range Status   Specimen Description   Final    BLOOD RIGHT ASSIST CONTROL Performed at [facility], [address] [place], [place] [address]     Special Requests   Final    BOTTLES DRAWN AEROBIC AND ANAEROBIC Blood Culture adequate volume Performed at [facility], [address], [place], [place] [address]    Culture  Setup Time   Final    GRAM NEGATIVE RODS IN BOTH AEROBIC AND ANAEROBIC BOTTLES CRITICAL RESULT CALLED TO, READ BACK BY AND VERIFIED WITH: CODY KINDLEY [phone] BY VIRAY, J CRITICAL VALUE NOTED.  VALUE IS CONSISTENT WITH PREVIOUSLY REPORTED AND CALLED VALUE.    Culture (A)  Final    ESCHERICHIA COLI SUSCEPTIBILITIES PERFORMED ON PREVIOUS CULTURE WITHIN THE LAST 5 DAYS. Performed at [facility], [address] [place], [place] [address]    Report Status 02/16/2023 FINAL  Final  Urine Culture     Status: Abnormal   Collection Time: 02/13/23  5:25 PM   Specimen: Urine, Random  Result Value Ref Range Status   Specimen Description   Final    URINE, RANDOM Performed at [facility], [address] [place], [place] [address]    Special Requests   Final    NONE Reflexed from [id number] Performed at [facility], [address], [place], [place] [address]    Culture (A)  Final    80,000 COLONIES/mL ESCHERICHIA COLI Confirmed Extended  Spectrum Beta-Lactamase Producer (ESBL).  In bloodstream infections from ESBL organisms, carbapenems are preferred over piperacillin/tazobactam. They are shown to have a lower risk of mortality.    Report Status 02/16/2023 FINAL  Final   Organism ID, Bacteria ESCHERICHIA COLI (A)  Final      Susceptibility   Escherichia coli - MIC*    AMPICILLIN >=32 RESISTANT Resistant     CEFAZOLIN >=64 RESISTANT Resistant     CEFEPIME >=32 RESISTANT Resistant     CEFTRIAXONE >=64 RESISTANT Resistant     CIPROFLOXACIN >=4 RESISTANT Resistant     GENTAMICIN <=1 SENSITIVE Sensitive     IMIPENEM <=0.25 SENSITIVE Sensitive     NITROFURANTOIN <=16 SENSITIVE Sensitive     TRIMETH/SULFA <=20 SENSITIVE Sensitive     AMPICILLIN/SULBACTAM >=32 RESISTANT Resistant     PIP/TAZO 8 SENSITIVE Sensitive      * 80,000 COLONIES/mL ESCHERICHIA COLI  MRSA Next Gen by PCR, Nasal     Status: Abnormal   Collection Time: 02/14/23 10:20 AM   Specimen: Nasal Mucosa; Nasal Swab  Result Value Ref Range Status   MRSA by PCR Next Gen DETECTED (A) NOT DETECTED Final    Comment: RESULT CALLED TO, READ BACK BY AND VERIFIED WITH: MCBRIDE,CASSIDY AT 1252 ON 02/14/23 BY FRATTO,ASHLEY        The GeneXpert MRSA Assay (FDA approved for NASAL specimens only), is one component of a comprehensive MRSA colonization surveillance program. It is not intended to diagnose MRSA infection nor to guide or monitor treatment for MRSA infections. Performed at [facility], [address] [place], [place] [address]     Antimicrobials: Anti-infectives (From admission, onward)    Start     Dose/Rate Route Frequency Ordered Stop   02/18/23 2215  meropenem (MERREM) 1 g in sodium chloride 0.9 % 100 mL IVPB        1 g 200 mL/hr over 30 Minutes Intravenous Every 8 hours 02/18/23 2201        Culture/Microbiology    Component Value Date/Time   SDES  02/13/2023 1725    URINE, RANDOM Performed at [facility], [address], [place], [place] [address]    SPECREQUEST  02/13/2023 1725    NONE Reflexed from [id number] Performed at [facility], [address] [place], [place] [address]    CULT (A) 02/13/2023 1725    80,000 COLONIES/mL ESCHERICHIA COLI Confirmed Extended Spectrum Beta-Lactamase Producer (ESBL).  In bloodstream infections from ESBL organisms, carbapenems are preferred over piperacillin/tazobactam. They are shown to have a lower risk of mortality.    REPTSTATUS 02/16/2023 FINAL 02/13/2023 1725    Other culture-see note  Radiology Studies: DG Foot 2 Views Left  Result Date: 02/18/2023 CLINICAL DATA:  Foot pain. EXAM: LEFT FOOT - 2 VIEW COMPARISON:  None Available. FINDINGS: Subjective osteopenia/osteoporosis. No fracture or dislocation. There are hammertoe deformities of the fourth and fifth toes. No  erosive change or periostitis. No significant arthropathy. Small plantar calcaneal spur. Surgical screw in the distal fibula. Mild soft tissue edema. No soft tissue gas or radiopaque foreign body. IMPRESSION: 1. Mild soft tissue edema. No acute osseous abnormality. 2. Subjective osteopenia/osteoporosis. Electronically Signed   By: Narda Rutherford M.D.   On: 02/18/2023 18:57   DG Chest Port 1 View  Result Date: 02/18/2023 CLINICAL DATA:  Chest pain. EXAM: PORTABLE CHEST 1 VIEW COMPARISON:  Radiograph 02/13/2023 FINDINGS: Patient is rotated. Stable heart size and mediastinal contours. Postsurgical change in the right lung. Stable linear atelectasis/scarring at the left lung  base. No acute airspace disease, large pleural effusion or pneumothorax. On limited assessment, no acute osseous findings. IMPRESSION: Stable radiographic appearance of the chest.  No acute findings. Electronically Signed   By: Narda Rutherford M.D.   On: 02/18/2023 18:55     LOS: 0 days   Lanae Boast, MD [facility]  02/19/2023, 10:30 AM

## 2023-02-19 NOTE — Hospital Course (Addendum)
67 y.o.f w/ history of BPD, Drug abuse, DM who was sent to the ED  due to problems with her PICC line. Patient was recently hospitalized 10/1 to 10/4 for septic shock secondary to ESBL E. coli UTI and bacteremia, with hyperglycemia, and encephalopathy., and was treated with IV meropenem > discharged on Invanz via PICC line, last dose was to be 02/21/23.  She was seen in the ED 1 day PTA with PICC line problem-new PICC line was placed and discharged back Again PICC line not functioning so sent to the ED . As per Nursing home staff, patient was picking on the PICC line. She reports acute on chronic pain to her left foot, due to hitting it while on the wheelchair.  She otherwise has no other complaints.  In ED: Alert awake oriented vitals are stable stable vitals.  Chest x-ray negative for acute abnormality.  Left foot x-ray shows mild soft tissue edema. Hospitalist was requested to admit the patient to complete antibiotics.

## 2023-02-20 DIAGNOSIS — Z1612 Extended spectrum beta lactamase (ESBL) resistance: Secondary | ICD-10-CM | POA: Diagnosis not present

## 2023-02-20 DIAGNOSIS — B9629 Other Escherichia coli [E. coli] as the cause of diseases classified elsewhere: Secondary | ICD-10-CM | POA: Diagnosis not present

## 2023-02-20 DIAGNOSIS — N390 Urinary tract infection, site not specified: Secondary | ICD-10-CM | POA: Diagnosis not present

## 2023-02-20 LAB — GLUCOSE, CAPILLARY
Glucose-Capillary: 267 mg/dL — ABNORMAL HIGH (ref 70–99)
Glucose-Capillary: 217 mg/dL — ABNORMAL HIGH (ref 70–99)
Glucose-Capillary: 252 mg/dL — ABNORMAL HIGH (ref 70–99)

## 2023-02-20 LAB — BASIC METABOLIC PANEL
Anion gap: 9 (ref 5–15)
BUN: 11 mg/dL (ref 8–23)
CO2: 25 mmol/L (ref 22–32)
Calcium: 8.7 mg/dL — ABNORMAL LOW (ref 8.9–10.3)
Chloride: 101 mmol/L (ref 98–111)
Creatinine, Ser: 0.53 mg/dL (ref 0.44–1.00)
GFR, Estimated: >60 mL/min (ref >60)
Glucose, Bld: 351 mg/dL — ABNORMAL HIGH (ref 70–99)
Potassium: 3.7 mmol/L (ref 3.5–5.1)
Sodium: 135 mmol/L (ref 135–145)

## 2023-02-20 MED ORDER — PSYLLIUM 95 % PO PACK: 1.0000 | PACK | Freq: Every day | ORAL | Status: DC

## 2023-02-20 MED ORDER — INSULIN GLARGINE-YFGN 100 UNIT/ML ~~LOC~~ SOLN
10.0000 [IU] | Freq: Every day | SUBCUTANEOUS | Status: DC
  Administered 2023-02-20 – 2023-02-22 (×3): 10 [IU] via SUBCUTANEOUS
  Filled 2023-02-20 (×4): qty 0.1

## 2023-02-20 NOTE — Progress Notes (Signed)
Throughout shift patient has repeatedly yelled out for help, accused staff of "neglecting her" or "intentionally withholding her pain medication", refused to sit on bedpan, refused to reposition off buttocks, refuse to speak to staff calmly and/or without being mean/hateful with intermittent verbal aggression.  Staff has repeatedly offered her covers however she repeatedly refuses to allow staff to put covers on her stating "I'm too hot and I never use the covers" but then curses and yells for staff to come in the room because she can't reach her covers.  Repeatedly yells at staff that she needs help or "hey come here I want to tell you something" or degrade previous staff of color calling them "[place]" or "that bug eyed black girl".  Has accused this nurse multiple times during the shift of being mean, refusing to give her pain medication, refusing to give her any of her medication, neglecting her, and "not wanting to help me or let me ask a question" despite having questions answered repeatedly through call light, nurse tech, or face to face.  Has yelled repeatedly "y'all don't care about me, you think it's funny that I'm in here in pain and the doctor told me that he would change my medication to how I take it at home which is when I need it".  Has been repeatedly informed that orders have not changed, medication is being given as ordered, staff is unable to give pain medication more often than ordered, and that she is being taken care of in addition to nobody neglecting her.  She has made multiple calls to other departments on the phone asking for them to "call the nurses and get them to help me cause they don't care about me".  She has accused staff of pinching her left breast, "scraping my butt raw to where it burns", and making her "poop in the bed" but patient adamantly refuses to sit on the bedpan for any period of time.

## 2023-02-20 NOTE — Progress Notes (Signed)
PROGRESS NOTE [name]  ZOX:[id number] DOB: 11-30-1955 DOA: 02/18/2023 PCP: Patient, [name] Per  Brief Narrative/Hospital Course: 67 y.o.f w/ history of BPD, Drug abuse, DM who was sent to the ED  due to problems with her PICC line. Patient was recently hospitalized 10/1 to 10/4 for septic shock secondary to ESBL E. coli UTI and bacteremia, with hyperglycemia, and encephalopathy., and was treated with IV meropenem > discharged on Invanz via PICC line, last dose was to be 02/21/23.  She was seen in the ED 1 day PTA with PICC line problem-new PICC line was placed and discharged back Again PICC line not functioning so sent to the ED . As per Nursing home staff, patient was picking on the PICC line. She reports acute on chronic pain to her left foot, due to hitting it while on the wheelchair.  She otherwise has no other complaints.  In ED: Alert awake oriented vitals are stable stable vitals.  Chest x-ray negative for acute abnormality.  Left foot x-ray shows mild soft tissue edema. Hospitalist was requested to admit the patient to complete antibiotics.    Subjective: Patient seen and examined this morning Past 24 hours patient has been afebrile BP at times soft but is stable on recheck blood sugar remains poorly controlled  Patient has been complaining of multiple issues-pain all over, restless leg Nursing staff has informed that patient has been threatening apparently she was threatening at the nursing facility and they have filed a complaint. Patient tells me that -she apologized for making threatening and unpleasant behaviors  Assessment and Plan: Principal Problem:   UTI due to extended-spectrum beta lactamase (ESBL) producing Escherichia coli Active Problems:   UTI (urinary tract infection)   Controlled type 2 diabetes mellitus with hyperglycemia (HCC)   DVT (deep venous thrombosis) 09/08/22   HTN (hypertension)   ESBL E. coli UTI POA: Recent hospitalization for septic shock UTI and  on IV Invanz THROUGH 02/21/23. She is admitted here with PICC line problem, will continue hospitalization until antibiotics completed which is until 10/9. PICC line will be removed for discharge   Hypertension: BP Soft at times.Not sure why home med list has midodrine, atenolol, HCTZ.  Holding antihypertensive. Continue midodrine alone and BP is controlled   DVT  Cont home Eliquis  Hypokalemia: Resolved.   Diabetes mellitus with uncontrolled A1c and uncontrolled hyperglycemia  A1c 10.9.  Continue SSI holding metformin for now.  Added Semglee 10 units. DM coordinator following Recent Labs  Lab 02/14/23 0510 02/14/23 0753 02/19/23 1220 02/19/23 1646 02/19/23 1932 02/20/23 0731 02/20/23 1127  GLUCAP  --    < > 284* 110* 159* 267* 217*  HGBA1C 10.9*  --   --   --   --   --   --    < > = values in this interval not displayed.     Hyperthyroidism: Continue her methimazole.  She was recently started on it on 10/2.  She has to follow-up with endocrinology as outpatient as per previous plan  Restless leg syndrome Insomnia Severe bipolar 1 disorder: Mood stable, continue on BuSpar, olanzapine, lithium, Neurontin.  Apparently says in making threats in the facility and also with nursing staff and with behavioral issues facility has requested psychiatry evaluation.  Chronically bedbound-nursing home resident last walk was a year ago Left shoulder pain chronic Chronic right foot drop: Will need outpatient orthopedic referral.    Obesity:Patient's Body mass index is 33.24 kg/m. : Will benefit with PCP follow-up, weight loss  healthy lifestyle and outpatient sleep evaluation.   DVT prophylaxis:  Code Status:   Code Status: Do not attempt resuscitation (DNR) PRE-ARREST INTERVENTIONS DESIRED Family Communication: plan of care discussed with patient at bedside. Patient status is: Inpatient because of UTI Level of care: Med-Surg   Dispo: The patient is from: SNF             Anticipated disposition: SNF after completing IV antibiotics on 10/9  Objective: Vitals last 24 hrs: Vitals:   02/19/23 1039 02/19/23 1500 02/19/23 1925 02/20/23 0333  BP: (!) 79/49 (!) 106/56 101/87 134/66  Pulse: 99 85 87 100  Resp: 18 16 16 16   Temp: (!) 97.5 F (36.4 C) 97.6 F (36.4 C) 98.7 F (37.1 C) 98.4 F (36.9 C)  TempSrc: Oral Oral Oral Oral  SpO2: 98% 96% 92% 97%  Weight:      Height:       Weight change:   Physical Examination: General exam: alert awake, oriented  HEENT:Oral mucosa moist, Ear/Nose WNL grossly Respiratory system: Bilaterally clear BS,no use of accessory muscle Cardiovascular system: S1 & S2 +, No JVD. Gastrointestinal system: Abdomen soft,NT,ND, BS+ Nervous System: Alert, awake, moving all extremities,and following commands. Extremities: LE edema neg,distal peripheral pulses palpable and warm.  Skin: No rashes,no icterus. MSK: Normal muscle bulk,tone, power   Medications reviewed:  Scheduled Meds:  apixaban  5 mg Oral BID   busPIRone  5 mg Oral BID   gabapentin  300 mg Oral TID   insulin aspart  0-15 Units Subcutaneous TID WC   insulin aspart  0-5 Units Subcutaneous QHS   insulin glargine-yfgn  10 Units Subcutaneous Daily   lithium carbonate  600 mg Oral Daily   melatonin  9 mg Oral QHS   methimazole  5 mg Oral BID   midodrine  5 mg Oral TID WC   OLANZapine  10 mg Oral QHS   psyllium  1 packet Oral Daily   saccharomyces boulardii  250 mg Oral Daily   tamsulosin  0.4 mg Oral Daily   Continuous Infusions:  meropenem (MERREM) IV 1 g (02/20/23 8657)      Diet Order             Diet Carb Modified Fluid consistency: Thin; Room service appropriate? Yes  Diet effective now                            Intake/Output Summary (Last 24 hours) at 02/20/2023 1152 Last data filed at 02/20/2023 0800 Gross per 24 hour  Intake 920 ml  Output 2400 ml  Net -1480 ml   Net IO Since Admission: -890 mL [02/20/23 1152]  Wt Readings from  Last 3 Encounters:  02/18/23 102.1 kg  02/17/23 102.1 kg  02/16/23 102.1 kg     Unresulted Labs (From admission, onward)     Start     Ordered   02/20/23 0500  Basic metabolic panel  Daily,   R      02/19/23 0911          Data Reviewed: I have personally reviewed following labs and imaging studies CBC: Recent Labs  Lab 02/13/23 1504 02/14/23 0510 02/15/23 0319 02/16/23 0503 02/18/23 1851 02/19/23 0559  WBC 9.9 12.0* 6.2 3.8* 5.6 4.3  NEUTROABS 8.7* 10.0*  --   --   --   --   HGB 15.8* 12.8 11.9* 11.6* 13.0 12.3  HCT 47.8* 40.0 36.6 37.3 40.5 37.6  MCV  90.9 92.6 93.6 95.9 92.3 91.0  PLT 145* 117* 105* 106* 200 208   Basic Metabolic Panel: Recent Labs  Lab 02/14/23 0510 02/14/23 0841 02/15/23 0319 02/16/23 0503 02/18/23 1851 02/19/23 0559 02/20/23 0501  NA 138  --  139 138 134* 136 135  K 3.0*  --  3.3* 3.6 3.7 3.2* 3.7  CL 104  --  108 108 97* 98 101  CO2 24  --  24 24 27 31 25   GLUCOSE 221*  --  192* 188* 263* 250* 351*  BUN 9  --  7* 6* 9 9 11   CREATININE 0.46  --  0.48 0.43* 0.50 0.46 0.53  CALCIUM 8.5*  --  8.3* 8.2* 8.7* 8.7* 8.7*  MG 1.6*  --  1.9 2.0  --   --   --   PHOS  --  1.5* 2.2* 2.2*  --   --   --    GFR: Estimated Creatinine Clearance: 89.2 mL/min (by C-G formula based on SCr of 0.53 mg/dL). Liver Function Tests: Recent Labs  Lab 02/13/23 1504 02/14/23 0510 02/16/23 0503  AST 23 15  --   ALT 30 22  --   ALKPHOS 99 72  --   BILITOT 1.7* 0.7  --   PROT 7.2 5.8*  --   ALBUMIN 3.6 2.8* 2.4*   Recent Labs  Lab 02/13/23 1504  LIPASE 19   Recent Labs  Lab 02/14/23 0510  AMMONIA 25   Coagulation Profile: Recent Labs  Lab 02/13/23 1504  INR 1.3*   BNP (last 3 results) No results for input(s): "PROBNP" in the last 8760 hours. HbA1C: No results for input(s): "HGBA1C" in the last 72 hours. CBG: Recent Labs  Lab 02/19/23 1220 02/19/23 1646 02/19/23 1932 02/20/23 0731 02/20/23 1127  GLUCAP 284* 110* 159* 267* 217*    Lipid Profile: No results for input(s): "CHOL", "HDL", "LDLCALC", "TRIG", "CHOLHDL", "LDLDIRECT" in the last 72 hours. Thyroid Function Tests: No results for input(s): "TSH", "T4TOTAL", "FREET4", "T3FREE", "THYROIDAB" in the last 72 hours. Sepsis Labs: Recent Labs  Lab 02/13/23 1504 02/13/23 1700 02/14/23 0841 02/14/23 1107  LATICACIDVEN 2.7* 2.0* 1.3 1.1    Recent Results (from the past 240 hour(s))  Resp panel by RT-PCR (RSV, Flu A&B, Covid) Anterior Nasal Swab     Status: None   Collection Time: 02/13/23  2:56 PM   Specimen: Anterior Nasal Swab  Result Value Ref Range Status   SARS Coronavirus 2 by RT PCR NEGATIVE NEGATIVE Final    Comment: (NOTE) SARS-CoV-2 target nucleic acids are NOT DETECTED.  The SARS-CoV-2 RNA is generally detectable in upper respiratory specimens during the acute phase of infection. The lowest concentration of SARS-CoV-2 viral copies this assay can detect is 138 copies/mL. A negative result does not preclude SARS-Cov-2 infection and should not be used as the sole basis for treatment or other patient management decisions. A negative result may occur with  improper specimen collection/handling, submission of specimen other than nasopharyngeal swab, presence of viral mutation(s) within the areas targeted by this assay, and inadequate number of viral copies(<138 copies/mL). A negative result must be combined with clinical observations, patient history, and epidemiological information. The expected result is Negative.  Fact Sheet for Patients:  BloggerCourse.com  Fact Sheet for Healthcare Providers:  SeriousBroker.it  This test is no t yet approved or cleared by the [place] FDA and  has been authorized for detection and/or diagnosis of SARS-CoV-2 by FDA under an Emergency Use Authorization (EUA). This  EUA will remain  in effect (meaning this test can be used) for the duration of the COVID-19  declaration under Section 564(b)(1) of the Act, 21 U.S.C.section 360bbb-3(b)(1), unless the authorization is terminated  or revoked sooner.       Influenza A by PCR NEGATIVE NEGATIVE Final   Influenza B by PCR NEGATIVE NEGATIVE Final    Comment: (NOTE) The Xpert Xpress SARS-CoV-2/FLU/RSV plus assay is intended as an aid in the diagnosis of influenza from Nasopharyngeal swab specimens and should not be used as a sole basis for treatment. Nasal washings and aspirates are unacceptable for Xpert Xpress SARS-CoV-2/FLU/RSV testing.  Fact Sheet for Patients: BloggerCourse.com  Fact Sheet for Healthcare Providers: SeriousBroker.it  This test is not yet approved or cleared by the [place] FDA and has been authorized for detection and/or diagnosis of SARS-CoV-2 by FDA under an Emergency Use Authorization (EUA). This EUA will remain in effect (meaning this test can be used) for the duration of the COVID-19 declaration under Section 564(b)(1) of the Act, 21 U.S.C. section 360bbb-3(b)(1), unless the authorization is terminated or revoked.     Resp Syncytial Virus by PCR NEGATIVE NEGATIVE Final    Comment: (NOTE) Fact Sheet for Patients: BloggerCourse.com  Fact Sheet for Healthcare Providers: SeriousBroker.it  This test is not yet approved or cleared by the [place] FDA and has been authorized for detection and/or diagnosis of SARS-CoV-2 by FDA under an Emergency Use Authorization (EUA). This EUA will remain in effect (meaning this test can be used) for the duration of the COVID-19 declaration under Section 564(b)(1) of the Act, 21 U.S.C. section 360bbb-3(b)(1), unless the authorization is terminated or revoked.  Performed at [facility], [address], [place], [place] [address]   Blood Culture (routine x 2)     Status: Abnormal   Collection Time: 02/13/23  3:04 PM    Specimen: BLOOD  Result Value Ref Range Status   Specimen Description   Final    BLOOD BLOOD RIGHT ARM Performed at [facility], [address], [place], [place] [address]    Special Requests   Final    BOTTLES DRAWN AEROBIC AND ANAEROBIC Blood Culture results may not be optimal due to an excessive volume of blood received in culture bottles Performed at [facility], [address], [place], [place] [address]    Culture  Setup Time   Final    GRAM NEGATIVE RODS IN BOTH AEROBIC AND ANAEROBIC BOTTLES CRITICAL RESULT CALLED TO, READ BACK BY AND VERIFIED WITH: CODY KINDLEY [id number] BY VIRAY, J CRITICAL RESULT CALLED TO, READ BACK BY AND VERIFIED WITH: PHARMD L.POOLE AT 1033 ON 02/14/2023 BY T.SAAD. Performed at [facility], [address], [place], [place] [address]    Culture (A)  Final    ESCHERICHIA COLI Confirmed Extended Spectrum Beta-Lactamase Producer (ESBL).  In bloodstream infections from ESBL organisms, carbapenems are preferred over piperacillin/tazobactam. They are shown to have a lower risk of mortality.    Report Status 02/16/2023 FINAL  Final   Organism ID, Bacteria ESCHERICHIA COLI  Final      Susceptibility   Escherichia coli - MIC*    AMPICILLIN >=32 RESISTANT Resistant     CEFEPIME 16 RESISTANT Resistant     CEFTAZIDIME RESISTANT Resistant     CEFTRIAXONE >=64 RESISTANT Resistant     CIPROFLOXACIN >=4 RESISTANT Resistant     GENTAMICIN <=1 SENSITIVE Sensitive     IMIPENEM <=0.25 SENSITIVE Sensitive     TRIMETH/SULFA <=20 SENSITIVE  Sensitive     AMPICILLIN/SULBACTAM >=32 RESISTANT Resistant     PIP/TAZO 8 SENSITIVE Sensitive     * ESCHERICHIA COLI  Blood Culture ID Panel (Reflexed)     Status: Abnormal   Collection Time: 02/13/23  3:04 PM  Result Value Ref Range Status   Enterococcus faecalis NOT DETECTED NOT DETECTED Final   Enterococcus Faecium NOT DETECTED NOT DETECTED Final   Listeria monocytogenes NOT DETECTED NOT DETECTED Final   Staphylococcus  species NOT DETECTED NOT DETECTED Final   Staphylococcus aureus (BCID) NOT DETECTED NOT DETECTED Final   Staphylococcus epidermidis NOT DETECTED NOT DETECTED Final   Staphylococcus lugdunensis NOT DETECTED NOT DETECTED Final   Streptococcus species NOT DETECTED NOT DETECTED Final   Streptococcus agalactiae NOT DETECTED NOT DETECTED Final   Streptococcus pneumoniae NOT DETECTED NOT DETECTED Final   Streptococcus pyogenes NOT DETECTED NOT DETECTED Final   A.calcoaceticus-baumannii NOT DETECTED NOT DETECTED Final   Bacteroides fragilis NOT DETECTED NOT DETECTED Final   Enterobacterales DETECTED (A) NOT DETECTED Final    Comment: Enterobacterales represent a large order of gram negative bacteria, not a single organism. CRITICAL RESULT CALLED TO, READ BACK BY AND VERIFIED WITH: PHARMD L.POOLE AT 1033 ON 02/14/2023 BY T.SAAD.    Enterobacter cloacae complex NOT DETECTED NOT DETECTED Final   Escherichia coli DETECTED (A) NOT DETECTED Final    Comment: CRITICAL RESULT CALLED TO, READ BACK BY AND VERIFIED WITH: PHARMD L.POOLE AT 1033 ON 02/14/2023 BY T.SAAD.    Klebsiella aerogenes NOT DETECTED NOT DETECTED Final   Klebsiella oxytoca NOT DETECTED NOT DETECTED Final   Klebsiella pneumoniae NOT DETECTED NOT DETECTED Final   Proteus species NOT DETECTED NOT DETECTED Final   Salmonella species NOT DETECTED NOT DETECTED Final   Serratia marcescens NOT DETECTED NOT DETECTED Final   Haemophilus influenzae NOT DETECTED NOT DETECTED Final   Neisseria meningitidis NOT DETECTED NOT DETECTED Final   Pseudomonas aeruginosa NOT DETECTED NOT DETECTED Final   Stenotrophomonas maltophilia NOT DETECTED NOT DETECTED Final   Candida albicans NOT DETECTED NOT DETECTED Final   Candida auris NOT DETECTED NOT DETECTED Final   Candida glabrata NOT DETECTED NOT DETECTED Final   Candida krusei NOT DETECTED NOT DETECTED Final   Candida parapsilosis NOT DETECTED NOT DETECTED Final   Candida tropicalis NOT DETECTED NOT  DETECTED Final   Cryptococcus neoformans/gattii NOT DETECTED NOT DETECTED Final   CTX-M ESBL DETECTED (A) NOT DETECTED Final    Comment: CRITICAL RESULT CALLED TO, READ BACK BY AND VERIFIED WITH: PHARMD L.POOLE AT 1033 ON 02/14/2023 BY T.SAAD. (NOTE) Extended spectrum beta-lactamase detected. Recommend a carbapenem as initial therapy.      Carbapenem resistance IMP NOT DETECTED NOT DETECTED Final   Carbapenem resistance KPC NOT DETECTED NOT DETECTED Final   Carbapenem resistance NDM NOT DETECTED NOT DETECTED Final   Carbapenem resist OXA 48 LIKE NOT DETECTED NOT DETECTED Final   Carbapenem resistance VIM NOT DETECTED NOT DETECTED Final    Comment: Performed at [facility], [address] [place], [place] [address]  Blood Culture (routine x 2)     Status: Abnormal   Collection Time: 02/13/23  3:16 PM   Specimen: BLOOD  Result Value Ref Range Status   Specimen Description   Final    BLOOD RIGHT ASSIST CONTROL Performed at [facility], [address], [place], [place] [address]    Special Requests   Final    BOTTLES DRAWN AEROBIC AND ANAEROBIC Blood Culture adequate volume Performed at [facility], [address], [place], [place] [address]    Culture  Setup Time   Final    GRAM NEGATIVE RODS IN BOTH AEROBIC AND ANAEROBIC BOTTLES CRITICAL RESULT CALLED TO, READ BACK BY AND VERIFIED WITH: CODY KINDLEY [id number] BY VIRAY, J CRITICAL VALUE NOTED.  VALUE IS CONSISTENT WITH PREVIOUSLY REPORTED AND CALLED VALUE.    Culture (A)  Final    ESCHERICHIA COLI SUSCEPTIBILITIES PERFORMED ON PREVIOUS CULTURE WITHIN THE LAST 5 DAYS. Performed at [facility], [address], [place], [place] [address]    Report Status 02/16/2023 FINAL  Final  Urine Culture     Status: Abnormal   Collection Time: 02/13/23  5:25 PM   Specimen: Urine, Random  Result Value Ref Range Status   Specimen Description   Final    URINE, RANDOM Performed at [facility], [address],  [place], [place] [address]    Special Requests   Final    NONE Reflexed from [id number] Performed at [facility], [address], [place], [place] [address]    Culture (A)  Final    80,000 COLONIES/mL ESCHERICHIA COLI Confirmed Extended Spectrum Beta-Lactamase Producer (ESBL).  In bloodstream infections from ESBL organisms, carbapenems are preferred over piperacillin/tazobactam. They are shown to have a lower risk of mortality.    Report Status 02/16/2023 FINAL  Final   Organism ID, Bacteria ESCHERICHIA COLI (A)  Final      Susceptibility   Escherichia coli - MIC*    AMPICILLIN >=32 RESISTANT Resistant     CEFAZOLIN >=64 RESISTANT Resistant     CEFEPIME >=32 RESISTANT Resistant     CEFTRIAXONE >=64 RESISTANT Resistant     CIPROFLOXACIN >=4 RESISTANT Resistant     GENTAMICIN <=1 SENSITIVE Sensitive     IMIPENEM <=0.25 SENSITIVE Sensitive     NITROFURANTOIN <=16 SENSITIVE Sensitive     TRIMETH/SULFA <=20 SENSITIVE Sensitive     AMPICILLIN/SULBACTAM >=32 RESISTANT Resistant     PIP/TAZO 8 SENSITIVE Sensitive     * 80,000 COLONIES/mL ESCHERICHIA COLI  MRSA Next Gen by PCR, Nasal     Status: Abnormal   Collection Time: 02/14/23 10:20 AM   Specimen: Nasal Mucosa; Nasal Swab  Result Value Ref Range Status   MRSA by PCR Next Gen DETECTED (A) NOT DETECTED Final    Comment: RESULT CALLED TO, READ BACK BY AND VERIFIED WITH: MCBRIDE,CASSIDY AT 1252 ON 02/14/23 BY FRATTO,ASHLEY        The GeneXpert MRSA Assay (FDA approved for NASAL specimens only), is one component of a comprehensive MRSA colonization surveillance program. It is not intended to diagnose MRSA infection nor to guide or monitor treatment for MRSA infections. Performed at [facility], [address], [place], [place] [address]     Antimicrobials: Anti-infectives (From admission, onward)    Start     Dose/Rate Route Frequency Ordered Stop   02/18/23 2215  meropenem (MERREM) 1 g in sodium chloride 0.9 % 100 mL IVPB        1  g 200 mL/hr over 30 Minutes Intravenous Every 8 hours 02/18/23 2201        Culture/Microbiology    Component Value Date/Time   SDES  02/13/2023 1725    URINE, RANDOM Performed at [facility], [address] [place], [place] [address]    SPECREQUEST  02/13/2023 1725    NONE Reflexed from [id number] Performed at [facility], [address], [place], [place] [address]    CULT (A) 02/13/2023 1725  80,000 COLONIES/mL ESCHERICHIA COLI Confirmed Extended Spectrum Beta-Lactamase Producer (ESBL).  In bloodstream infections from ESBL organisms, carbapenems are preferred over piperacillin/tazobactam. They are shown to have a lower risk of mortality.    REPTSTATUS 02/16/2023 FINAL 02/13/2023 1725    Other culture-see note  Radiology Studies: DG Foot 2 Views Left  Result Date: 02/18/2023 CLINICAL DATA:  Foot pain. EXAM: LEFT FOOT - 2 VIEW COMPARISON:  None Available. FINDINGS: Subjective osteopenia/osteoporosis. No fracture or dislocation. There are hammertoe deformities of the fourth and fifth toes. No erosive change or periostitis. No significant arthropathy. Small plantar calcaneal spur. Surgical screw in the distal fibula. Mild soft tissue edema. No soft tissue gas or radiopaque foreign body. IMPRESSION: 1. Mild soft tissue edema. No acute osseous abnormality. 2. Subjective osteopenia/osteoporosis. Electronically Signed   By: Narda Rutherford M.D.   On: 02/18/2023 18:57   DG Chest Port 1 View  Result Date: 02/18/2023 CLINICAL DATA:  Chest pain. EXAM: PORTABLE CHEST 1 VIEW COMPARISON:  Radiograph 02/13/2023 FINDINGS: Patient is rotated. Stable heart size and mediastinal contours. Postsurgical change in the right lung. Stable linear atelectasis/scarring at the left lung base. No acute airspace disease, large pleural effusion or pneumothorax. On limited assessment, no acute osseous findings. IMPRESSION: Stable radiographic appearance of the chest.  No acute findings. Electronically Signed   By:  Narda Rutherford M.D.   On: 02/18/2023 18:55     LOS: 1 day   Lanae Boast, MD [facility]  02/20/2023, 11:52 AM

## 2023-02-20 NOTE — TOC Progression Note (Signed)
Transition of Care [facility]) - Progression Note    Patient Details  Name: ANWEN CANNEDY MRN: [id number] Date of Birth: 08/20/1955  Transition of Care [facility]) CM/SW Contact  Leitha Bleak, RN Phone Number: 02/20/2023, 11:39 AM  Clinical Narrative:   CM called [facility] to discuss discharge planning. They states she can not return until IV medication are complete. She will just pull out the IV. They are requesting psy eval. Patient has behavioral issues, threaten to kill staff or have someone come in to kill them. They have filled a report and she will have to go to court. They feel her behavior could be better controlled with medication changes and therapy. RN on floor state they also have been have issues. MD ordered TTS. TOC following.    Expected Discharge Plan: Skilled Nursing Facility Barriers to Discharge: Continued Medical Work up  Expected Discharge Plan and Services     Living arrangements for the past 2 months: Skilled Nursing Facility      Social Determinants of Health (SDOH) Interventions SDOH Screenings   Food Insecurity: No Food Insecurity (02/18/2023)  Housing: Low Risk  (02/18/2023)  Transportation Needs: No Transportation Needs (02/18/2023)  Utilities: Not At Risk (02/18/2023)  Alcohol Screen: Low Risk  (11/14/2017)  Social Connections: Unknown (07/05/2021)   [facility] from [facility], [facility]  Tobacco Use: Medium Risk (02/19/2023)    Readmission Risk Interventions    02/14/2023    9:57 AM 08/03/2021    1:25 PM  Readmission Risk Prevention Plan  Transportation Screening Complete Complete  PCP or Specialist Appt within 5-7 Days  Complete  Home Care Screening  Complete  Medication Review (RN CM)  Complete  Medication Review Ocean[employer]) Complete   HRI or Home Care Consult Complete   SW Recovery Care/Counseling Consult Complete   Palliative Care Screening Not Applicable   Skilled Nursing Facility Complete

## 2023-02-20 NOTE — Inpatient Diabetes Management (Signed)
Inpatient Diabetes Program Recommendations  AACE/ADA: New Consensus Statement on Inpatient Glycemic Control (2015)  Target Ranges:  Prepandial:   less than 140 mg/dL      Peak postprandial:   less than 180 mg/dL (1-2 hours)      Critically ill patients:  140 - 180 mg/dL   Lab Results  Component Value Date   GLUCAP 267 (H) 02/20/2023   HGBA1C 10.9 (H) 02/14/2023    Review of Glycemic Control  Latest Reference Range & Units 02/19/23 08:01 02/19/23 12:20 02/19/23 16:46 02/19/23 19:32 02/20/23 07:31  Glucose-Capillary 70 - 99 mg/dL 478 (H) 295 (H) 621 (H) 159 (H) 267 (H)   Diabetes history: DM 2 Outpatient Diabetes medications: Fiasp 0-10 units 4x/day, metformin 500 mg Daily Current orders for Inpatient glycemic control:  Novolog 0-15 units tid + hs  Note: glucose trends rebound back up to the 260's fasting glucose yesterday and today.  -  Consider add Semglee 10 units Q24 hours  Thanks,  Christena Deem RN, MSN, BC-ADM Inpatient Diabetes Coordinator Team Pager [phone] (8a-5p)

## 2023-02-21 DIAGNOSIS — I10 Essential (primary) hypertension: Secondary | ICD-10-CM

## 2023-02-21 DIAGNOSIS — N390 Urinary tract infection, site not specified: Secondary | ICD-10-CM | POA: Diagnosis not present

## 2023-02-21 DIAGNOSIS — E1165 Type 2 diabetes mellitus with hyperglycemia: Secondary | ICD-10-CM | POA: Diagnosis not present

## 2023-02-21 DIAGNOSIS — B9629 Other Escherichia coli [E. coli] as the cause of diseases classified elsewhere: Secondary | ICD-10-CM | POA: Diagnosis not present

## 2023-02-21 LAB — BASIC METABOLIC PANEL
Anion gap: 8 (ref 5–15)
BUN: 10 mg/dL (ref 8–23)
CO2: 27 mmol/L (ref 22–32)
Calcium: 9.1 mg/dL (ref 8.9–10.3)
Chloride: 103 mmol/L (ref 98–111)
Creatinine, Ser: 0.46 mg/dL (ref 0.44–1.00)
GFR, Estimated: >60 mL/min (ref >60)
Glucose, Bld: 234 mg/dL — ABNORMAL HIGH (ref 70–99)
Potassium: 4.2 mmol/L (ref 3.5–5.1)
Sodium: 138 mmol/L (ref 135–145)

## 2023-02-21 LAB — GLUCOSE, CAPILLARY
Glucose-Capillary: 182 mg/dL — ABNORMAL HIGH (ref 70–99)
Glucose-Capillary: 383 mg/dL — ABNORMAL HIGH (ref 70–99)
Glucose-Capillary: 122 mg/dL — ABNORMAL HIGH (ref 70–99)
Glucose-Capillary: 139 mg/dL — ABNORMAL HIGH (ref 70–99)

## 2023-02-21 MED ORDER — OXYCODONE HCL 5 MG PO TABS
5.0000 mg | ORAL_TABLET | ORAL | Status: DC | PRN
  Administered 2023-02-21 – 2023-02-22 (×8): 5 mg via ORAL
  Filled 2023-02-21 (×8): qty 1

## 2023-02-21 MED ORDER — OXYCODONE-ACETAMINOPHEN 5-325 MG PO TABS
1.0000 | ORAL_TABLET | ORAL | Status: DC | PRN
  Administered 2023-02-21 – 2023-02-22 (×8): 1 via ORAL
  Filled 2023-02-21 (×8): qty 1

## 2023-02-21 NOTE — Progress Notes (Signed)
PROGRESS NOTE [name]  ZOX:[id number] DOB: 08-06-1955 DOA: 02/18/2023 PCP: Patient, [name] Per  Brief Narrative/Hospital Course: 67 y.o.f w/ history of BPD, Drug abuse, DM who was sent to the ED  due to problems with her PICC line. Patient was recently hospitalized 10/1 to 10/4 for septic shock secondary to ESBL E. coli UTI and bacteremia, with hyperglycemia, and encephalopathy., and was treated with IV meropenem > discharged on Invanz via PICC line, last dose was to be 02/21/23.  She was seen in the ED 1 day PTA with PICC line problem-new PICC line was placed and discharged back Again PICC line not functioning so sent to the ED . As per Nursing home staff, patient was picking on the PICC line. She reports acute on chronic pain to her left foot, due to hitting it while on the wheelchair.  She otherwise has no other complaints.  In ED: Alert awake oriented vitals are stable stable vitals.  Chest x-ray negative for acute abnormality.  Left foot x-ray shows mild soft tissue edema. Hospitalist was requested to admit the patient to complete antibiotics.    Subjective: Overnight events noted.  Patient noted to be anxious this morning.  Complains of pain in her left foot which is chronic.  Wants to go back to her nursing facility.    Assessment and Plan:  ESBL E. coli UTI POA: Recent hospitalization for septic shock UTI and on IV Invanz THROUGH 02/21/23. She is admitted here with PICC line problem.  Subsequently she was hospitalized to complete treatment of antibiotics.  She will complete her treatment tonight.  PICC line can be removed tomorrow.  Agitation in the setting of bipolar disorder Patient with inappropriate behavior at times.  Apparently she has had behavioral issues in her nursing facility as well.  Psychiatry has been consulted to assist with management. She is currently noted to be on BuSpar, gabapentin, lithium, olanzapine.  She is also on melatonin for insomnia. Lithium level was  noted to be 0.08 when checked on October 1.   Essential hypertension: Patient was noted to be on midodrine and atenolol and HCTZ.  Atenolol and HCTZ were discontinued.  Midodrine was continued.  Monitor blood pressure trends.     History of DVT  Continue Eliquis  Hypokalemia: Resolved.   Diabetes mellitus with uncontrolled A1c and uncontrolled hyperglycemia  A1c 10.9.  Continue SSI holding metformin for now.  Added Semglee 10 units.    Hyperthyroidism: Continue her methimazole.  She was recently started on it on 10/2.  She has to follow-up with endocrinology as outpatient as per previous plan.  On 02/14/2023 her TSH was 0.102, free T3 was 3.5 and free T4 was 1.43.  Restless leg syndrome Insomnia Continue home medications  Chronically bedbound-nursing home resident last walk was a year ago Left shoulder pain chronic Chronic right foot drop: Will need outpatient orthopedic referral. Patient noted to be on oxycodone.  Home medication list reviewed.  Looks like she was on Percocet 10/325 every 4 hours as needed.  Will increase her medications back to her usual dose.  Obesity: Estimated body mass index is 33.24 kg/m as calculated from the following:   Height as of this encounter: 5\' 9"  (1.753 m).   Weight as of this encounter: 102.1 kg.   DVT prophylaxis: On Eliquis Code Status:   Code Status: Do not attempt resuscitation (DNR) PRE-ARREST INTERVENTIONS DESIRED Family Communication: plan of care discussed with patient at bedside. Disposition: Back to SNF when medically cleared.  Objective: Vitals last 24 hrs: Vitals:   02/20/23 0333 02/20/23 1256 02/20/23 2003 02/21/23 0304  BP: 134/66 (!) 106/49 98/63 (!) 143/91  Pulse: 100 83 82 91  Resp: 16 18 18 16   Temp: 98.4 F (36.9 C) 97.8 F (36.6 C) 98.4 F (36.9 C) 98.3 F (36.8 C)  TempSrc: Oral Oral Oral Oral  SpO2: 97% 100% 96% 97%  Weight:      Height:       Weight change:   Physical Examination:  General  appearance: Awake alert.  In no distress.  Somewhat anxious. Resp: Clear to auscultation bilaterally.  Normal effort Cardio: S1-S2 is normal regular.  No S3-S4.  No rubs murmurs or bruit GI: Abdomen is soft.  Nontender nondistended.  Bowel sounds are present normal.  No masses organomegaly   Medications reviewed:  Scheduled Meds:  apixaban  5 mg Oral BID   busPIRone  5 mg Oral BID   gabapentin  300 mg Oral TID   insulin aspart  0-15 Units Subcutaneous TID WC   insulin aspart  0-5 Units Subcutaneous QHS   insulin glargine-yfgn  10 Units Subcutaneous Daily   lithium carbonate  600 mg Oral Daily   melatonin  9 mg Oral QHS   methimazole  5 mg Oral BID   midodrine  5 mg Oral TID WC   OLANZapine  10 mg Oral QHS   psyllium  1 packet Oral Daily   saccharomyces boulardii  250 mg Oral Daily   tamsulosin  0.4 mg Oral Daily   Continuous Infusions:  meropenem (MERREM) IV 1 g (02/21/23 0800)     Data Reviewed: CBC: Recent Labs  Lab 02/15/23 0319 02/16/23 0503 02/18/23 1851 02/19/23 0559  WBC 6.2 3.8* 5.6 4.3  HGB 11.9* 11.6* 13.0 12.3  HCT 36.6 37.3 40.5 37.6  MCV 93.6 95.9 92.3 91.0  PLT 105* 106* 200 208   Basic Metabolic Panel: Recent Labs  Lab 02/15/23 0319 02/16/23 0503 02/18/23 1851 02/19/23 0559 02/20/23 0501 02/21/23 0510  NA 139 138 134* 136 135 138  K 3.3* 3.6 3.7 3.2* 3.7 4.2  CL 108 108 97* 98 101 103  CO2 24 24 27 31 25 27   GLUCOSE 192* 188* 263* 250* 351* 234*  BUN 7* 6* 9 9 11 10   CREATININE 0.48 0.43* 0.50 0.46 0.53 0.46  CALCIUM 8.3* 8.2* 8.7* 8.7* 8.7* 9.1  MG 1.9 2.0  --   --   --   --   PHOS 2.2* 2.2*  --   --   --   --    GFR: Estimated Creatinine Clearance: 89.2 mL/min (by C-G formula based on SCr of 0.46 mg/dL). Liver Function Tests: Recent Labs  Lab 02/16/23 0503  ALBUMIN 2.4*    CBG: Recent Labs  Lab 02/19/23 1932 02/20/23 0731 02/20/23 1127 02/20/23 2006 02/21/23 0756  GLUCAP 159* 267* 217* 252* 182*    Sepsis Labs: Recent  Labs  Lab 02/14/23 1107  LATICACIDVEN 1.1    Recent Results (from the past 240 hour(s))  Resp panel by RT-PCR (RSV, Flu A&B, Covid) Anterior Nasal Swab     Status: None   Collection Time: 02/13/23  2:56 PM   Specimen: Anterior Nasal Swab  Result Value Ref Range Status   SARS Coronavirus 2 by RT PCR NEGATIVE NEGATIVE Final    Comment: (NOTE) SARS-CoV-2 target nucleic acids are NOT DETECTED.  The SARS-CoV-2 RNA is generally detectable in upper respiratory specimens during the acute phase of infection. The lowest  concentration of SARS-CoV-2 viral copies this assay can detect is 138 copies/mL. A negative result does not preclude SARS-Cov-2 infection and should not be used as the sole basis for treatment or other patient management decisions. A negative result may occur with  improper specimen collection/handling, submission of specimen other than nasopharyngeal swab, presence of viral mutation(s) within the areas targeted by this assay, and inadequate number of viral copies(<138 copies/mL). A negative result must be combined with clinical observations, patient history, and epidemiological information. The expected result is Negative.  Fact Sheet for Patients:  BloggerCourse.com  Fact Sheet for Healthcare Providers:  SeriousBroker.it  This test is no t yet approved or cleared by the [place] FDA and  has been authorized for detection and/or diagnosis of SARS-CoV-2 by FDA under an Emergency Use Authorization (EUA). This EUA will remain  in effect (meaning this test can be used) for the duration of the COVID-19 declaration under Section 564(b)(1) of the Act, 21 U.S.C.section 360bbb-3(b)(1), unless the authorization is terminated  or revoked sooner.       Influenza A by PCR NEGATIVE NEGATIVE Final   Influenza B by PCR NEGATIVE NEGATIVE Final    Comment: (NOTE) The Xpert Xpress SARS-CoV-2/FLU/RSV plus assay is intended as an  aid in the diagnosis of influenza from Nasopharyngeal swab specimens and should not be used as a sole basis for treatment. Nasal washings and aspirates are unacceptable for Xpert Xpress SARS-CoV-2/FLU/RSV testing.  Fact Sheet for Patients: BloggerCourse.com  Fact Sheet for Healthcare Providers: SeriousBroker.it  This test is not yet approved or cleared by the [place] FDA and has been authorized for detection and/or diagnosis of SARS-CoV-2 by FDA under an Emergency Use Authorization (EUA). This EUA will remain in effect (meaning this test can be used) for the duration of the COVID-19 declaration under Section 564(b)(1) of the Act, 21 U.S.C. section 360bbb-3(b)(1), unless the authorization is terminated or revoked.     Resp Syncytial Virus by PCR NEGATIVE NEGATIVE Final    Comment: (NOTE) Fact Sheet for Patients: BloggerCourse.com  Fact Sheet for Healthcare Providers: SeriousBroker.it  This test is not yet approved or cleared by the [place] FDA and has been authorized for detection and/or diagnosis of SARS-CoV-2 by FDA under an Emergency Use Authorization (EUA). This EUA will remain in effect (meaning this test can be used) for the duration of the COVID-19 declaration under Section 564(b)(1) of the Act, 21 U.S.C. section 360bbb-3(b)(1), unless the authorization is terminated or revoked.  Performed at [facility], [address] [place], [place] [address]   Blood Culture (routine x 2)     Status: Abnormal   Collection Time: 02/13/23  3:04 PM   Specimen: BLOOD  Result Value Ref Range Status   Specimen Description   Final    BLOOD BLOOD RIGHT ARM Performed at [facility], [address], [place], [place] [address]    Special Requests   Final    BOTTLES DRAWN AEROBIC AND ANAEROBIC Blood Culture results may not be optimal due to an excessive volume of blood received  in culture bottles Performed at [facility], [address] [place], [place] [address]    Culture  Setup Time   Final    GRAM NEGATIVE RODS IN BOTH AEROBIC AND ANAEROBIC BOTTLES CRITICAL RESULT CALLED TO, READ BACK BY AND VERIFIED WITH: CODY KINDLEY [id number] BY VIRAY, J CRITICAL RESULT CALLED TO, READ BACK BY AND VERIFIED WITH: PHARMD L.POOLE AT 1033 ON 02/14/2023 BY T.SAAD. Performed at [facility], [address], [place], [place] [address]    Culture (A)  Final    ESCHERICHIA COLI Confirmed Extended Spectrum Beta-Lactamase Producer (ESBL).  In bloodstream infections from ESBL organisms, carbapenems are preferred over piperacillin/tazobactam. They are shown to have a lower risk of mortality.    Report Status 02/16/2023 FINAL  Final   Organism ID, Bacteria ESCHERICHIA COLI  Final      Susceptibility   Escherichia coli - MIC*    AMPICILLIN >=32 RESISTANT Resistant     CEFEPIME 16 RESISTANT Resistant     CEFTAZIDIME RESISTANT Resistant     CEFTRIAXONE >=64 RESISTANT Resistant     CIPROFLOXACIN >=4 RESISTANT Resistant     GENTAMICIN <=1 SENSITIVE Sensitive     IMIPENEM <=0.25 SENSITIVE Sensitive     TRIMETH/SULFA <=20 SENSITIVE Sensitive     AMPICILLIN/SULBACTAM >=32 RESISTANT Resistant     PIP/TAZO 8 SENSITIVE Sensitive     * ESCHERICHIA COLI  Blood Culture ID Panel (Reflexed)     Status: Abnormal   Collection Time: 02/13/23  3:04 PM  Result Value Ref Range Status   Enterococcus faecalis NOT DETECTED NOT DETECTED Final   Enterococcus Faecium NOT DETECTED NOT DETECTED Final   Listeria monocytogenes NOT DETECTED NOT DETECTED Final   Staphylococcus species NOT DETECTED NOT DETECTED Final   Staphylococcus aureus (BCID) NOT DETECTED NOT DETECTED Final   Staphylococcus epidermidis NOT DETECTED NOT DETECTED Final   Staphylococcus lugdunensis NOT DETECTED NOT DETECTED Final   Streptococcus species NOT DETECTED NOT DETECTED Final   Streptococcus agalactiae NOT DETECTED NOT  DETECTED Final   Streptococcus pneumoniae NOT DETECTED NOT DETECTED Final   Streptococcus pyogenes NOT DETECTED NOT DETECTED Final   A.calcoaceticus-baumannii NOT DETECTED NOT DETECTED Final   Bacteroides fragilis NOT DETECTED NOT DETECTED Final   Enterobacterales DETECTED (A) NOT DETECTED Final    Comment: Enterobacterales represent a large order of gram negative bacteria, not a single organism. CRITICAL RESULT CALLED TO, READ BACK BY AND VERIFIED WITH: PHARMD L.POOLE AT 1033 ON 02/14/2023 BY T.SAAD.    Enterobacter cloacae complex NOT DETECTED NOT DETECTED Final   Escherichia coli DETECTED (A) NOT DETECTED Final    Comment: CRITICAL RESULT CALLED TO, READ BACK BY AND VERIFIED WITH: PHARMD L.POOLE AT 1033 ON 02/14/2023 BY T.SAAD.    Klebsiella aerogenes NOT DETECTED NOT DETECTED Final   Klebsiella oxytoca NOT DETECTED NOT DETECTED Final   Klebsiella pneumoniae NOT DETECTED NOT DETECTED Final   Proteus species NOT DETECTED NOT DETECTED Final   Salmonella species NOT DETECTED NOT DETECTED Final   Serratia marcescens NOT DETECTED NOT DETECTED Final   Haemophilus influenzae NOT DETECTED NOT DETECTED Final   Neisseria meningitidis NOT DETECTED NOT DETECTED Final   Pseudomonas aeruginosa NOT DETECTED NOT DETECTED Final   Stenotrophomonas maltophilia NOT DETECTED NOT DETECTED Final   Candida albicans NOT DETECTED NOT DETECTED Final   Candida auris NOT DETECTED NOT DETECTED Final   Candida glabrata NOT DETECTED NOT DETECTED Final   Candida krusei NOT DETECTED NOT DETECTED Final   Candida parapsilosis NOT DETECTED NOT DETECTED Final   Candida tropicalis NOT DETECTED NOT DETECTED Final   Cryptococcus neoformans/gattii NOT DETECTED NOT DETECTED Final   CTX-M ESBL DETECTED (A) NOT DETECTED Final    Comment: CRITICAL RESULT CALLED TO, READ BACK BY AND VERIFIED WITH: PHARMD L.POOLE AT 1033 ON 02/14/2023 BY T.SAAD. (NOTE) Extended spectrum beta-lactamase detected. Recommend a carbapenem  as initial therapy.      Carbapenem resistance IMP NOT DETECTED  NOT DETECTED Final   Carbapenem resistance KPC NOT DETECTED NOT DETECTED Final   Carbapenem resistance NDM NOT DETECTED NOT DETECTED Final   Carbapenem resist OXA 48 LIKE NOT DETECTED NOT DETECTED Final   Carbapenem resistance VIM NOT DETECTED NOT DETECTED Final    Comment: Performed at [facility], [address] [place], [place] [address]  Blood Culture (routine x 2)     Status: Abnormal   Collection Time: 02/13/23  3:16 PM   Specimen: BLOOD  Result Value Ref Range Status   Specimen Description   Final    BLOOD RIGHT ASSIST CONTROL Performed at [facility], [address], [place], [place] [address]    Special Requests   Final    BOTTLES DRAWN AEROBIC AND ANAEROBIC Blood Culture adequate volume Performed at [facility], [address], [place], [place] [address]    Culture  Setup Time   Final    GRAM NEGATIVE RODS IN BOTH AEROBIC AND ANAEROBIC BOTTLES CRITICAL RESULT CALLED TO, READ BACK BY AND VERIFIED WITH: CODY KINDLEY [id number] BY VIRAY, J CRITICAL VALUE NOTED.  VALUE IS CONSISTENT WITH PREVIOUSLY REPORTED AND CALLED VALUE.    Culture (A)  Final    ESCHERICHIA COLI SUSCEPTIBILITIES PERFORMED ON PREVIOUS CULTURE WITHIN THE LAST 5 DAYS. Performed at [facility], [address] [place], [place] [address]    Report Status 02/16/2023 FINAL  Final  Urine Culture     Status: Abnormal   Collection Time: 02/13/23  5:25 PM   Specimen: Urine, Random  Result Value Ref Range Status   Specimen Description   Final    URINE, RANDOM Performed at [facility], [address], [place], [place] [address]    Special Requests   Final    NONE Reflexed from [id number] Performed at [facility], [address] [place], [place] [address]    Culture (A)  Final    80,000 COLONIES/mL ESCHERICHIA COLI Confirmed Extended Spectrum Beta-Lactamase Producer (ESBL).  In bloodstream infections from ESBL organisms,  carbapenems are preferred over piperacillin/tazobactam. They are shown to have a lower risk of mortality.    Report Status 02/16/2023 FINAL  Final   Organism ID, Bacteria ESCHERICHIA COLI (A)  Final      Susceptibility   Escherichia coli - MIC*    AMPICILLIN >=32 RESISTANT Resistant     CEFAZOLIN >=64 RESISTANT Resistant     CEFEPIME >=32 RESISTANT Resistant     CEFTRIAXONE >=64 RESISTANT Resistant     CIPROFLOXACIN >=4 RESISTANT Resistant     GENTAMICIN <=1 SENSITIVE Sensitive     IMIPENEM <=0.25 SENSITIVE Sensitive     NITROFURANTOIN <=16 SENSITIVE Sensitive     TRIMETH/SULFA <=20 SENSITIVE Sensitive     AMPICILLIN/SULBACTAM >=32 RESISTANT Resistant     PIP/TAZO 8 SENSITIVE Sensitive     * 80,000 COLONIES/mL ESCHERICHIA COLI  MRSA Next Gen by PCR, Nasal     Status: Abnormal   Collection Time: 02/14/23 10:20 AM   Specimen: Nasal Mucosa; Nasal Swab  Result Value Ref Range Status   MRSA by PCR Next Gen DETECTED (A) NOT DETECTED Final    Comment: RESULT CALLED TO, READ BACK BY AND VERIFIED WITH: MCBRIDE,CASSIDY AT 1252 ON 02/14/23 BY FRATTO,ASHLEY        The GeneXpert MRSA Assay (FDA approved for NASAL specimens only), is one component of a comprehensive MRSA colonization surveillance program. It is not intended to diagnose MRSA infection nor to guide or monitor treatment for MRSA infections.  Performed at [facility], [address], [place], [place] [address]       Culture/Microbiology    Component Value Date/Time   SDES  02/13/2023 1725    URINE, RANDOM Performed at [facility], [address] [place], [place] [address]    SPECREQUEST  02/13/2023 1725    NONE Reflexed from [id number] Performed at [facility], [address] [place], [place] [address]    CULT (A) 02/13/2023 1725    80,000 COLONIES/mL ESCHERICHIA COLI Confirmed Extended Spectrum Beta-Lactamase Producer (ESBL).  In bloodstream infections from ESBL organisms, carbapenems are preferred over  piperacillin/tazobactam. They are shown to have a lower risk of mortality.    REPTSTATUS 02/16/2023 FINAL 02/13/2023 1725   Radiology Studies: No results found.   LOS: 2 days   Osvaldo Shipper, MD [facility]  02/21/2023, 10:03 AM

## 2023-02-21 NOTE — Inpatient Diabetes Management (Signed)
Inpatient Diabetes Program Recommendations  AACE/ADA: New Consensus Statement on Inpatient Glycemic Control (2015)  Target Ranges:  Prepandial:   less than 140 mg/dL      Peak postprandial:   less than 180 mg/dL (1-2 hours)      Critically ill patients:  140 - 180 mg/dL   Lab Results  Component Value Date   GLUCAP 182 (H) 02/21/2023   HGBA1C 10.9 (H) 02/14/2023    Review of Glycemic Control  Latest Reference Range & Units 02/19/23 08:01 02/19/23 12:20 02/19/23 16:46 02/19/23 19:32 02/20/23 07:31  Glucose-Capillary 70 - 99 mg/dL 401 (H) 027 (H) 253 (H) 159 (H) 267 (H)   Diabetes history: DM 2 Outpatient Diabetes medications: Fiasp 0-10 units 4x/day, metformin 500 mg Daily Current orders for Inpatient glycemic control:  Novolog 0-15 units tid + hs Semglee 10 units  A1c 10.9% on 10/2, Diabetes Coordinator spoke with pt on 10/4  -  Increase Semglee 15 units Q24 hours  Thanks,  Christena Deem RN, MSN, BC-ADM Inpatient Diabetes Coordinator Team Pager [phone] (8a-5p)

## 2023-02-21 NOTE — Progress Notes (Signed)
Throughout shift patient has been argumentive with nursing staff.  About noon today, she informed the nurse tech that she wasn't concerned about the "psych consult because I know what to say to keep from being committed as this isn't the first time I have done this".  Has repeatedly yelled at nursing staff for covering her up with blankets despite requesting them moments before, has attempted several times to manipulate staff and start arguments between them while in the room accusing one staff or the other of supposedly saying that she did/didn't ask/demand something and smile when staff attempt to clarify/confirm what was said/done with patient adding in "that's not what I heard/saw" while smiling.  When her pastor was visiting early in the shift she was overly pleasant/compliant with staff regarding care however shortly after he left, she became argumentive and non compliant with care and staff.  Repeatedly refusing to turn despite c/o pain to right hip and buttocks, new loose non productive cough, and not comfortable.  Has been refusing to follow diabetic diet and when education attempted states "I have only been diabetic for 2 months and I know what to do because my family are all diabetics and they've told me what I can/can't eat" but late in shift was c/o that son and most of her family hasn't been talking to her and she "doesn't know why".  Has continued to use her phone to call other departments and nursing station extensions to get help and when asked states "nobody cares about me, y'all are neglecting me, and when you do come in you refuse to answer any questions or talk to me or do what I ask plus you're mean".

## 2023-02-22 DIAGNOSIS — Z1612 Extended spectrum beta lactamase (ESBL) resistance: Secondary | ICD-10-CM | POA: Diagnosis not present

## 2023-02-22 DIAGNOSIS — F314 Bipolar disorder, current episode depressed, severe, without psychotic features: Secondary | ICD-10-CM

## 2023-02-22 DIAGNOSIS — B9629 Other Escherichia coli [E. coli] as the cause of diseases classified elsewhere: Secondary | ICD-10-CM | POA: Diagnosis not present

## 2023-02-22 DIAGNOSIS — N390 Urinary tract infection, site not specified: Secondary | ICD-10-CM | POA: Diagnosis not present

## 2023-02-22 LAB — BASIC METABOLIC PANEL
Anion gap: 5 (ref 5–15)
BUN: 10 mg/dL (ref 8–23)
CO2: 26 mmol/L (ref 22–32)
Calcium: 8.7 mg/dL — ABNORMAL LOW (ref 8.9–10.3)
Chloride: 111 mmol/L (ref 98–111)
Creatinine, Ser: 0.39 mg/dL — ABNORMAL LOW (ref 0.44–1.00)
GFR, Estimated: >60 mL/min (ref >60)
Glucose, Bld: 167 mg/dL — ABNORMAL HIGH (ref 70–99)
Potassium: 4.2 mmol/L (ref 3.5–5.1)
Sodium: 142 mmol/L (ref 135–145)

## 2023-02-22 LAB — GLUCOSE, CAPILLARY
Glucose-Capillary: 175 mg/dL — ABNORMAL HIGH (ref 70–99)
Glucose-Capillary: 130 mg/dL — ABNORMAL HIGH (ref 70–99)
Glucose-Capillary: 228 mg/dL — ABNORMAL HIGH (ref 70–99)
Glucose-Capillary: 233 mg/dL — ABNORMAL HIGH (ref 70–99)

## 2023-02-22 MED ORDER — ZOLPIDEM TARTRATE 5 MG PO TABS: 5.0000 mg | ORAL_TABLET | Freq: Every evening | ORAL | 0 refills | Status: DC | PRN

## 2023-02-22 MED ORDER — OXYCODONE-ACETAMINOPHEN 10-325 MG PO TABS: 1.0000 | ORAL_TABLET | ORAL | 0 refills | Status: DC | PRN

## 2023-02-22 MED ORDER — LORAZEPAM 0.5 MG PO TABS: 0.5000 mg | ORAL_TABLET | Freq: Every evening | ORAL | 0 refills | Status: DC | PRN

## 2023-02-22 MED ORDER — BASAGLAR KWIKPEN 100 UNIT/ML ~~LOC~~ SOPN: 10.0000 [IU] | PEN_INJECTOR | Freq: Every day | SUBCUTANEOUS | Status: DC

## 2023-02-22 NOTE — Progress Notes (Signed)
Report called to RN Rayfield Citizen  at [facility] . EMS has been called for transportation

## 2023-02-22 NOTE — Care Management Important Message (Signed)
Important Message  Patient Details  Name: [name] MRN: [id number] Date of Birth: [birth year]   Important Message Given:  Yes - Medicare IM (spoke with Ardis Hughs by phone, no additional copy needed)     Corey Harold 02/22/2023, 3:34 PM

## 2023-02-22 NOTE — Discharge Summary (Signed)
[facility]  Physician Discharge Summary   Patient ID: [name] MRN: [id number] DOB/AGE: Oct 16, 1955 67 y.o.  Admit date: 02/18/2023 Discharge date:   02/22/2023   PCP: Patient, [name] Per  DISCHARGE DIAGNOSES:    UTI due to extended-spectrum beta lactamase (ESBL) producing Escherichia coli   Controlled type 2 diabetes mellitus with hyperglycemia (HCC)   DVT (deep venous thrombosis) 09/08/22   HTN (hypertension)   RECOMMENDATIONS FOR OUTPATIENT FOLLOW UP: Please check CBC and basic metabolic panel in 1 week    Home Health: Going to SNF Equipment/Devices: None  CODE STATUS: MOST form completed.  Limited resuscitation  DISCHARGE CONDITION: fair  Diet recommendation: As before  INITIAL HISTORY: 67 y.o.f w/ history of BPD, Drug abuse, DM who was sent to the ED  due to problems with her PICC line. Patient was recently hospitalized 10/1 to 10/4 for septic shock secondary to ESBL E. coli UTI and bacteremia, with hyperglycemia, and encephalopathy., and was treated with IV meropenem > discharged on Invanz via PICC line, last dose was to be 02/21/23.  She was seen in the ED 1 day PTA with PICC line problem-new PICC line was placed and discharged back Again PICC line not functioning so sent to the ED . As per Nursing home staff, patient was picking on the PICC line. She reports acute on chronic pain to her left foot, due to hitting it while on the wheelchair.  She otherwise has no other complaints.   In ED: Alert awake oriented vitals are stable stable vitals.  Chest x-ray negative for acute abnormality.  Left foot x-ray shows mild soft tissue edema. Hospitalist was requested to admit the patient to complete antibiotics.    HOSPITAL COURSE:   ESBL E. coli UTI POA: Recent hospitalization for septic shock UTI and on IV Invanz THROUGH 02/21/23. She is admitted here with PICC line problem.  Subsequently she was hospitalized to complete treatment of antibiotics.  She  completed treatment on 10/9.  PICC line can be discontinued.     Agitation in the setting of bipolar disorder Patient with inappropriate behavior at times.  Apparently she has had behavioral issues in her nursing facility as well.  Psychiatry has been consulted to assist with management. She is currently noted to be on BuSpar, gabapentin, lithium, olanzapine.  She is also on melatonin for insomnia. Lithium level was noted to be 0.08 when checked on October 1. Await psychiatry input prior to discharge.   Essential hypertension: Patient was noted to be on midodrine and atenolol and HCTZ.  Atenolol and HCTZ were discontinued.  Midodrine was continued.  Monitor blood pressure trends.     History of DVT  Continue Eliquis   Hypokalemia: Resolved.   Diabetes mellitus with uncontrolled A1c and uncontrolled hyperglycemia  A1c 10.9.  Continue home medications.  Glargine has been added.   Hyperthyroidism: Continue her methimazole.  She was recently started on it on 10/2.  She has to follow-up with endocrinology as outpatient as per previous plan.   On 02/14/2023 her TSH was 0.102, free T3 was 3.5 and free T4 was 1.43.   Restless leg syndrome Insomnia Continue home medications   Chronically bedbound-nursing home resident last walk was a year ago Left shoulder pain chronic Chronic right foot drop: Continue with medications as taken prior to admission.   Obesity: Estimated body mass index is 33.24 kg/m as calculated from the following:   Height as of this encounter: 5\' 9"  (1.753 m).   Weight as  of this encounter: 102.1 kg.    Patient is stable.  Waiting on psychiatry input.  Okay for discharge otherwise back to her skilled nursing facility.   PERTINENT LABS:  The results of significant diagnostics from this hospitalization (including imaging, microbiology, ancillary and laboratory) are listed below for reference.    Microbiology: Recent Results (from the past 240 hour(s))  Resp  panel by RT-PCR (RSV, Flu A&B, Covid) Anterior Nasal Swab     Status: None   Collection Time: 02/13/23  2:56 PM   Specimen: Anterior Nasal Swab  Result Value Ref Range Status   SARS Coronavirus 2 by RT PCR NEGATIVE NEGATIVE Final    Comment: (NOTE) SARS-CoV-2 target nucleic acids are NOT DETECTED.  The SARS-CoV-2 RNA is generally detectable in upper respiratory specimens during the acute phase of infection. The lowest concentration of SARS-CoV-2 viral copies this assay can detect is 138 copies/mL. A negative result does not preclude SARS-Cov-2 infection and should not be used as the sole basis for treatment or other patient management decisions. A negative result may occur with  improper specimen collection/handling, submission of specimen other than nasopharyngeal swab, presence of viral mutation(s) within the areas targeted by this assay, and inadequate number of viral copies(<138 copies/mL). A negative result must be combined with clinical observations, patient history, and epidemiological information. The expected result is Negative.  Fact Sheet for Patients:  BloggerCourse.com  Fact Sheet for Healthcare Providers:  SeriousBroker.it  This test is no t yet approved or cleared by the [place] FDA and  has been authorized for detection and/or diagnosis of SARS-CoV-2 by FDA under an Emergency Use Authorization (EUA). This EUA will remain  in effect (meaning this test can be used) for the duration of the COVID-19 declaration under Section 564(b)(1) of the Act, 21 U.S.C.section 360bbb-3(b)(1), unless the authorization is terminated  or revoked sooner.       Influenza A by PCR NEGATIVE NEGATIVE Final   Influenza B by PCR NEGATIVE NEGATIVE Final    Comment: (NOTE) The Xpert Xpress SARS-CoV-2/FLU/RSV plus assay is intended as an aid in the diagnosis of influenza from Nasopharyngeal swab specimens and should not be used as a  sole basis for treatment. Nasal washings and aspirates are unacceptable for Xpert Xpress SARS-CoV-2/FLU/RSV testing.  Fact Sheet for Patients: BloggerCourse.com  Fact Sheet for Healthcare Providers: SeriousBroker.it  This test is not yet approved or cleared by the [place] FDA and has been authorized for detection and/or diagnosis of SARS-CoV-2 by FDA under an Emergency Use Authorization (EUA). This EUA will remain in effect (meaning this test can be used) for the duration of the COVID-19 declaration under Section 564(b)(1) of the Act, 21 U.S.C. section 360bbb-3(b)(1), unless the authorization is terminated or revoked.     Resp Syncytial Virus by PCR NEGATIVE NEGATIVE Final    Comment: (NOTE) Fact Sheet for Patients: BloggerCourse.com  Fact Sheet for Healthcare Providers: SeriousBroker.it  This test is not yet approved or cleared by the [place] FDA and has been authorized for detection and/or diagnosis of SARS-CoV-2 by FDA under an Emergency Use Authorization (EUA). This EUA will remain in effect (meaning this test can be used) for the duration of the COVID-19 declaration under Section 564(b)(1) of the Act, 21 U.S.C. section 360bbb-3(b)(1), unless the authorization is terminated or revoked.  Performed at [facility], [address] [place], [place] [address]   Blood Culture (routine x 2)     Status: Abnormal   Collection Time: 02/13/23  3:04  PM   Specimen: BLOOD  Result Value Ref Range Status   Specimen Description   Final    BLOOD BLOOD RIGHT ARM Performed at [facility], [address], [place], [place] [address]    Special Requests   Final    BOTTLES DRAWN AEROBIC AND ANAEROBIC Blood Culture results may not be optimal due to an excessive volume of blood received in culture bottles Performed at [facility], [address] [place], [place] [address]     Culture  Setup Time   Final    GRAM NEGATIVE RODS IN BOTH AEROBIC AND ANAEROBIC BOTTLES CRITICAL RESULT CALLED TO, READ BACK BY AND VERIFIED WITH: CODY KINDLEY [id number] BY VIRAY, J CRITICAL RESULT CALLED TO, READ BACK BY AND VERIFIED WITH: PHARMD L.POOLE AT 1033 ON 02/14/2023 BY T.SAAD. Performed at [facility], [address], [place], [place] [address]    Culture (A)  Final    ESCHERICHIA COLI Confirmed Extended Spectrum Beta-Lactamase Producer (ESBL).  In bloodstream infections from ESBL organisms, carbapenems are preferred over piperacillin/tazobactam. They are shown to have a lower risk of mortality.    Report Status 02/16/2023 FINAL  Final   Organism ID, Bacteria ESCHERICHIA COLI  Final      Susceptibility   Escherichia coli - MIC*    AMPICILLIN >=32 RESISTANT Resistant     CEFEPIME 16 RESISTANT Resistant     CEFTAZIDIME RESISTANT Resistant     CEFTRIAXONE >=64 RESISTANT Resistant     CIPROFLOXACIN >=4 RESISTANT Resistant     GENTAMICIN <=1 SENSITIVE Sensitive     IMIPENEM <=0.25 SENSITIVE Sensitive     TRIMETH/SULFA <=20 SENSITIVE Sensitive     AMPICILLIN/SULBACTAM >=32 RESISTANT Resistant     PIP/TAZO 8 SENSITIVE Sensitive     * ESCHERICHIA COLI  Blood Culture ID Panel (Reflexed)     Status: Abnormal   Collection Time: 02/13/23  3:04 PM  Result Value Ref Range Status   Enterococcus faecalis NOT DETECTED NOT DETECTED Final   Enterococcus Faecium NOT DETECTED NOT DETECTED Final   Listeria monocytogenes NOT DETECTED NOT DETECTED Final   Staphylococcus species NOT DETECTED NOT DETECTED Final   Staphylococcus aureus (BCID) NOT DETECTED NOT DETECTED Final   Staphylococcus epidermidis NOT DETECTED NOT DETECTED Final   Staphylococcus lugdunensis NOT DETECTED NOT DETECTED Final   Streptococcus species NOT DETECTED NOT DETECTED Final   Streptococcus agalactiae NOT DETECTED NOT DETECTED Final   Streptococcus pneumoniae NOT DETECTED NOT DETECTED Final   Streptococcus  pyogenes NOT DETECTED NOT DETECTED Final   A.calcoaceticus-baumannii NOT DETECTED NOT DETECTED Final   Bacteroides fragilis NOT DETECTED NOT DETECTED Final   Enterobacterales DETECTED (A) NOT DETECTED Final    Comment: Enterobacterales represent a large order of gram negative bacteria, not a single organism. CRITICAL RESULT CALLED TO, READ BACK BY AND VERIFIED WITH: PHARMD L.POOLE AT 1033 ON 02/14/2023 BY T.SAAD.    Enterobacter cloacae complex NOT DETECTED NOT DETECTED Final   Escherichia coli DETECTED (A) NOT DETECTED Final    Comment: CRITICAL RESULT CALLED TO, READ BACK BY AND VERIFIED WITH: PHARMD L.POOLE AT 1033 ON 02/14/2023 BY T.SAAD.    Klebsiella aerogenes NOT DETECTED NOT DETECTED Final   Klebsiella oxytoca NOT DETECTED NOT DETECTED Final   Klebsiella pneumoniae NOT DETECTED NOT DETECTED Final   Proteus species NOT DETECTED NOT DETECTED Final   Salmonella species NOT DETECTED NOT DETECTED Final   Serratia marcescens NOT DETECTED NOT DETECTED Final   Haemophilus influenzae NOT DETECTED NOT DETECTED Final  Neisseria meningitidis NOT DETECTED NOT DETECTED Final   Pseudomonas aeruginosa NOT DETECTED NOT DETECTED Final   Stenotrophomonas maltophilia NOT DETECTED NOT DETECTED Final   Candida albicans NOT DETECTED NOT DETECTED Final   Candida auris NOT DETECTED NOT DETECTED Final   Candida glabrata NOT DETECTED NOT DETECTED Final   Candida krusei NOT DETECTED NOT DETECTED Final   Candida parapsilosis NOT DETECTED NOT DETECTED Final   Candida tropicalis NOT DETECTED NOT DETECTED Final   Cryptococcus neoformans/gattii NOT DETECTED NOT DETECTED Final   CTX-M ESBL DETECTED (A) NOT DETECTED Final    Comment: CRITICAL RESULT CALLED TO, READ BACK BY AND VERIFIED WITH: PHARMD L.POOLE AT 1033 ON 02/14/2023 BY T.SAAD. (NOTE) Extended spectrum beta-lactamase detected. Recommend a carbapenem as initial therapy.      Carbapenem resistance IMP NOT DETECTED NOT DETECTED Final    Carbapenem resistance KPC NOT DETECTED NOT DETECTED Final   Carbapenem resistance NDM NOT DETECTED NOT DETECTED Final   Carbapenem resist OXA 48 LIKE NOT DETECTED NOT DETECTED Final   Carbapenem resistance VIM NOT DETECTED NOT DETECTED Final    Comment: Performed at [facility], [address], [place], [place] [address]  Blood Culture (routine x 2)     Status: Abnormal   Collection Time: 02/13/23  3:16 PM   Specimen: BLOOD  Result Value Ref Range Status   Specimen Description   Final    BLOOD RIGHT ASSIST CONTROL Performed at [facility], [address] [place], [place] [address]    Special Requests   Final    BOTTLES DRAWN AEROBIC AND ANAEROBIC Blood Culture adequate volume Performed at [facility], [address] [place], [place] [address]    Culture  Setup Time   Final    GRAM NEGATIVE RODS IN BOTH AEROBIC AND ANAEROBIC BOTTLES CRITICAL RESULT CALLED TO, READ BACK BY AND VERIFIED WITH: CODY KINDLEY [id number] BY VIRAY, J CRITICAL VALUE NOTED.  VALUE IS CONSISTENT WITH PREVIOUSLY REPORTED AND CALLED VALUE.    Culture (A)  Final    ESCHERICHIA COLI SUSCEPTIBILITIES PERFORMED ON PREVIOUS CULTURE WITHIN THE LAST 5 DAYS. Performed at [facility], [address], [place], [place] [address]    Report Status 02/16/2023 FINAL  Final  Urine Culture     Status: Abnormal   Collection Time: 02/13/23  5:25 PM   Specimen: Urine, Random  Result Value Ref Range Status   Specimen Description   Final    URINE, RANDOM Performed at [facility], [address], [place], [place] [address]    Special Requests   Final    NONE Reflexed from [phone] Performed at [facility], [address], [place], [place] [address]    Culture (A)  Final    80,000 COLONIES/mL ESCHERICHIA COLI Confirmed Extended Spectrum Beta-Lactamase Producer (ESBL).  In bloodstream infections from ESBL organisms, carbapenems are preferred over piperacillin/tazobactam. They are shown to have a lower risk of  mortality.    Report Status 02/16/2023 FINAL  Final   Organism ID, Bacteria ESCHERICHIA COLI (A)  Final      Susceptibility   Escherichia coli - MIC*    AMPICILLIN >=32 RESISTANT Resistant     CEFAZOLIN >=64 RESISTANT Resistant     CEFEPIME >=32 RESISTANT Resistant     CEFTRIAXONE >=64 RESISTANT Resistant     CIPROFLOXACIN >=4 RESISTANT Resistant     GENTAMICIN <=1 SENSITIVE Sensitive     IMIPENEM <=0.25 SENSITIVE Sensitive     NITROFURANTOIN <=16 SENSITIVE Sensitive  TRIMETH/SULFA <=20 SENSITIVE Sensitive     AMPICILLIN/SULBACTAM >=32 RESISTANT Resistant     PIP/TAZO 8 SENSITIVE Sensitive     * 80,000 COLONIES/mL ESCHERICHIA COLI  MRSA Next Gen by PCR, Nasal     Status: Abnormal   Collection Time: 02/14/23 10:20 AM   Specimen: Nasal Mucosa; Nasal Swab  Result Value Ref Range Status   MRSA by PCR Next Gen DETECTED (A) NOT DETECTED Final    Comment: RESULT CALLED TO, READ BACK BY AND VERIFIED WITH: MCBRIDE,CASSIDY AT 1252 ON 02/14/23 BY FRATTO,ASHLEY        The GeneXpert MRSA Assay (FDA approved for NASAL specimens only), is one component of a comprehensive MRSA colonization surveillance program. It is not intended to diagnose MRSA infection nor to guide or monitor treatment for MRSA infections. Performed at [facility], [address] [place], [place] [address]      Labs:   Basic Metabolic Panel: Recent Labs  Lab 02/16/23 0503 02/18/23 1851 02/19/23 0559 02/20/23 0501 02/21/23 0510 02/22/23 0505  NA 138 134* 136 135 138 142  K 3.6 3.7 3.2* 3.7 4.2 4.2  CL 108 97* 98 101 103 111  CO2 24 27 31 25 27 26   GLUCOSE 188* 263* 250* 351* 234* 167*  BUN 6* 9 9 11 10 10   CREATININE 0.43* 0.50 0.46 0.53 0.46 0.39*  CALCIUM 8.2* 8.7* 8.7* 8.7* 9.1 8.7*  MG 2.0  --   --   --   --   --   PHOS 2.2*  --   --   --   --   --    Liver Function Tests: Recent Labs  Lab 02/16/23 0503  ALBUMIN 2.4*    CBC: Recent Labs  Lab 02/16/23 0503 02/18/23 1851  02/19/23 0559  WBC 3.8* 5.6 4.3  HGB 11.6* 13.0 12.3  HCT 37.3 40.5 37.6  MCV 95.9 92.3 91.0  PLT 106* 200 208     CBG: Recent Labs  Lab 02/21/23 0756 02/21/23 1137 02/21/23 1550 02/21/23 2112 02/22/23 0730  GLUCAP 182* 383* 122* 139* 175*     IMAGING STUDIES DG Foot 2 Views Left  Result Date: 02/18/2023 CLINICAL DATA:  Foot pain. EXAM: LEFT FOOT - 2 VIEW COMPARISON:  None Available. FINDINGS: Subjective osteopenia/osteoporosis. No fracture or dislocation. There are hammertoe deformities of the fourth and fifth toes. No erosive change or periostitis. No significant arthropathy. Small plantar calcaneal spur. Surgical screw in the distal fibula. Mild soft tissue edema. No soft tissue gas or radiopaque foreign body. IMPRESSION: 1. Mild soft tissue edema. No acute osseous abnormality. 2. Subjective osteopenia/osteoporosis. Electronically Signed   By: Narda Rutherford M.D.   On: 02/18/2023 18:57   DG Chest Port 1 View  Result Date: 02/18/2023 CLINICAL DATA:  Chest pain. EXAM: PORTABLE CHEST 1 VIEW COMPARISON:  Radiograph 02/13/2023 FINDINGS: Patient is rotated. Stable heart size and mediastinal contours. Postsurgical change in the right lung. Stable linear atelectasis/scarring at the left lung base. No acute airspace disease, large pleural effusion or pneumothorax. On limited assessment, no acute osseous findings. IMPRESSION: Stable radiographic appearance of the chest.  No acute findings. Electronically Signed   By: Narda Rutherford M.D.   On: 02/18/2023 18:55     DISCHARGE EXAMINATION: Vitals:   02/21/23 0304 02/21/23 1556 02/21/23 2022 02/22/23 0459  BP: (!) 143/91 (!) 120/94 103/72 (!) 96/25  Pulse: 91 89 87 85  Resp: 16 20 20 18   Temp: 98.3 F (36.8 C) 98.5 F (36.9 C) 98.7 F (  37.1 C) 97.7 F (36.5 C)  TempSrc: Oral Oral Oral Oral  SpO2: 97% 100% 99% 95%  Weight:      Height:       General appearance: Awake alert.  In no distress Resp: Clear to auscultation  bilaterally.  Normal effort Cardio: S1-S2 is normal regular.  No S3-S4.  No rubs murmurs or bruit GI: Abdomen is soft.  Nontender nondistended.  Bowel sounds are present normal.  No masses organomegaly   DISPOSITION: SNF  Discharge Instructions     Call MD for:  difficulty breathing, headache or visual disturbances   Complete by: As directed    Call MD for:  extreme fatigue   Complete by: As directed    Call MD for:  persistant dizziness or light-headedness   Complete by: As directed    Call MD for:  persistant nausea and vomiting   Complete by: As directed    Call MD for:  severe uncontrolled pain   Complete by: As directed    Call MD for:  temperature >100.4   Complete by: As directed    Diet - low sodium heart healthy   Complete by: As directed    Discharge instructions   Complete by: As directed    Please review instructions on the discharge summary.  You were cared for by a hospitalist during your hospital stay. If you have any questions about your discharge medications or the care you received while you were in the hospital after you are discharged, you can call the unit and asked to speak with the hospitalist on call if the hospitalist that took care of you is not available. Once you are discharged, your primary care physician will handle any further medical issues. Please note that NO REFILLS for any discharge medications will be authorized once you are discharged, as it is imperative that you return to your primary care physician (or establish a relationship with a primary care physician if you do not have one) for your aftercare needs so that they can reassess your need for medications and monitor your lab values. If you do not have a primary care physician, you can call [phone] for a physician referral.   Increase activity slowly   Complete by: As directed         Allergies as of 02/22/2023       Reactions   Sulfa Antibiotics Swelling, Rash   Neck swelling         Medication List     STOP taking these medications    atenolol 25 MG tablet Commonly known as: Tenormin   ertapenem IVPB Commonly known as: INVANZ   HEPARIN LOCK FLUSH IV   hydrochlorothiazide 25 MG tablet Commonly known as: HYDRODIURIL       TAKE these medications    acetaminophen 325 MG tablet Commonly known as: TYLENOL Take 650 mg by mouth every 4 (four) hours as needed for mild pain.   ascorbic acid 500 MG tablet Commonly known as: VITAMIN C Take 500 mg by mouth 2 (two) times daily.   Basaglar KwikPen 100 UNIT/ML Inject 10 Units into the skin daily.   busPIRone 5 MG tablet Commonly known as: BUSPAR Take 5 mg by mouth 2 (two) times daily.   calcium carbonate 500 MG chewable tablet Commonly known as: TUMS - dosed in mg elemental calcium Chew 1 tablet by mouth every 5 (five) hours as needed for indigestion or heartburn.   cetirizine 10 MG tablet Commonly known as: ZYRTEC Take  10 mg by mouth daily. For allergies   Eliquis 5 MG Tabs tablet Generic drug: apixaban Take 5 mg by mouth 2 (two) times daily.   Fiasp FlexTouch 100 UNIT/ML FlexTouch Pen Generic drug: insulin aspart Inject 0-10 Units into the skin 4 (four) times daily -  before meals and at bedtime. Short acting insulin (Aspart) per sliding scale 0-10 ---:-- Insulin injection 0-10 Units 0-10 Units Subcutaneous, 3 times daily with meals CBG 70 - 120: 0 unit CBG 121 - 150: 0 unit  CBG 151 - 200: 1 unit CBG 201 - 250: 2 units CBG 251 - 300: 4 units CBG 301 - 350: 6 units  CBG 351 - 400: 8 units  CBG > 400: 10 units   folic acid 1 MG tablet Commonly known as: FOLVITE Take 1 mg by mouth daily.   gabapentin 100 MG capsule Commonly known as: NEURONTIN Take 300 mg by mouth 3 (three) times daily.   hydrOXYzine 25 MG tablet Commonly known as: ATARAX Take 1 tablet (25 mg total) by mouth every 6 (six) hours as needed for itching.   Ketoconazole 2 % Foam Apply 1 Application topically daily. To scalp, for  seborrheic dermatitis.   lactulose 10 GM/15ML solution Commonly known as: CHRONULAC Take 30 mLs by mouth daily as needed for moderate constipation.   lidocaine 4 % dressing Apply 1 Application topically every 12 (twelve) hours as needed (pain).   lidocaine 5 % Commonly known as: LIDODERM Place 1 patch onto the skin daily. Remove & Discard patch within 12 hours or as directed by MD; Apply to LOWER LEG   lithium carbonate 300 MG capsule Take 600 mg by mouth daily.   loperamide 2 MG tablet Commonly known as: IMODIUM A-D Take 2 mg by mouth every 6 (six) hours as needed for diarrhea or loose stools.   LORazepam 0.5 MG tablet Commonly known as: ATIVAN Take 1 tablet (0.5 mg total) by mouth at bedtime as needed for anxiety. What changed: reasons to take this   Melatonin 10 MG Tabs Take 10 mg by mouth at bedtime.   metFORMIN 500 MG tablet Commonly known as: GLUCOPHAGE Take 1 pill a day What changed:  how much to take how to take this when to take this   methimazole 5 MG tablet Commonly known as: TAPAZOLE Take 1 tablet (5 mg total) by mouth 2 (two) times daily.   midodrine 5 MG tablet Commonly known as: PROAMATINE Take 1 tablet (5 mg total) by mouth 3 (three) times daily with meals.   MULTIVITAMIN ADULT PO Take 1 tablet by mouth daily.   OLANZapine 10 MG tablet Commonly known as: ZYPREXA Take 10 mg by mouth at bedtime.   ondansetron 4 MG tablet Commonly known as: ZOFRAN Take 4 mg by mouth every 8 (eight) hours as needed.   oxyCODONE-acetaminophen 10-325 MG tablet Commonly known as: PERCOCET Take 1 tablet by mouth every 4 (four) hours as needed.   potassium chloride 20 MEQ/15ML (10%) Soln Take 10 mEq by mouth daily.   saccharomyces boulardii 250 MG capsule Commonly known as: FLORASTOR Take 250 mg by mouth daily.   Selsun Blue Itchy Dry Scalp 1 % shampoo Generic drug: pyrithione zinc Apply topically daily as needed for itching. What changed: how much to take    senna-docusate 8.6-50 MG tablet Commonly known as: Senokot-S Take 2 tablets by mouth 2 (two) times daily. May hold for loose stools   tamsulosin 0.4 MG Caps capsule Commonly known as: FLOMAX Take 0.4  mg by mouth daily.   zolpidem 5 MG tablet Commonly known as: AMBIEN Take 1 tablet (5 mg total) by mouth at bedtime as needed for sleep.           TOTAL DISCHARGE TIME: 35 minutes  Dakisha Schoof [employer] on [email or web address]  02/22/2023, 10:17 AM

## 2023-02-22 NOTE — TOC Transition Note (Signed)
Transition of Care [facility]) - CM/SW Discharge Note   Patient Details  Name: MAN BONNEAU MRN: [id number] Date of Birth: 1956-04-15  Transition of Care [facility]) CM/SW Contact:  Leitha Bleak, RN Phone Number: 02/22/2023, 3:05 PM   Clinical Narrative:   [facility] cleared patient. [facility] provided room number A30-Bed 2 - RN calling report. EMS scheduled. CM called her sister, [name].  Melissa will follow up with [facility]. She states when patient is on the right medications and mood stabilizer she is calm, a completely different person than we are seeing here in the hospital.     Final next level of care: Skilled Nursing Facility Barriers to Discharge: Barriers Resolved   Patient Goals and CMS Choice CMS Medicare.gov Compare Post Acute Care list provided to:: Patient Choice offered to / list presented to : Patient  Discharge Placement   [facility]   Patient and family notified of of transfer: 02/22/23    Social Determinants of Health (SDOH) Interventions SDOH Screenings   Food Insecurity: No Food Insecurity (02/18/2023)  Housing: Low Risk  (02/18/2023)  Transportation Needs: No Transportation Needs (02/18/2023)  Utilities: Not At Risk (02/18/2023)  Alcohol Screen: Low Risk  (11/14/2017)  Social Connections: Unknown (07/05/2021)   Received from [facility], [facility]  Tobacco Use: Medium Risk (02/19/2023)     Readmission Risk Interventions    02/14/2023    9:57 AM 08/03/2021    1:25 PM  Readmission Risk Prevention Plan  Transportation Screening Complete Complete  PCP or Specialist Appt within 5-7 Days  Complete  Home Care Screening  Complete  Medication Review (RN CM)  Complete  Medication Review Oceanographer) Complete   HRI or Home Care Consult Complete   SW Recovery Care/Counseling Consult Complete   Palliative Care Screening Not Applicable   Skilled Nursing Facility Complete

## 2023-02-22 NOTE — Consult Note (Signed)
[facility] ED ASSESSMENT    Reason for Consult:  agitation, h/o bipolar d/o, making threat at her SNF Referring Physician:  Dr. Rito Ehrlich Patient Identification: [name] MRN:  [id number] ED Chief Complaint: Severe bipolar I disorder, most recent episode depressed (HCC)  Diagnosis:  Principal Problem:   Severe bipolar I disorder, most recent episode depressed (HCC) Active Problems:   UTI (urinary tract infection)   Controlled type 2 diabetes mellitus with hyperglycemia (HCC)   DVT (deep venous thrombosis) 09/08/22   UTI due to extended-spectrum beta lactamase (ESBL) producing Escherichia coli   HTN (hypertension)   ED Assessment Time Calculation: Start Time: 1330 Stop Time: 1345 Total Time in Minutes (Assessment Completion): 15   Subjective: ANELA BENSMAN is a 67 y.o. female patient with a history of BPD, Drug abuse, DM who was sent to the ED due to problems with her PICC line.    HPI:  ANALIYAH Gonzalez, 67 y.o., female patient seen face to face by this provider, consulted with Dr. Rebecca Eaton; and chart reviewed on 02/22/23.  On evaluation [name] reports that she is feeling good and ready to return to her nursing facility [facility].  She currently states that she does not have any suicidal thoughts, denies HI/AVH.  She states that she likes living at [facility], states that she gets along well with her roommate, and for the most part gets along well with staff, has no complaints about her facility.  She also states that her appetite and sleep are good, states she has been having some issues with her foot to where has been hard for her to get out of bed, and feels that sometimes the staff do not hear her and she has to stay in bed all day due to not being assisted in getting out of bed.  She does state that she has a history of bipolar, and does not feel she needs to be admitted to an inpatient facility, states she is compliant with her medications.  During evaluation [name]  is laying in bed in no acute distress. She is alert, oriented x 3, calm, cooperative and attentive. Her mood is euthymic with congruent affect.  She has normal speech, and behavior.  Objectively there is no evidence of psychosis/mania or delusional thinking.  Patient is able to converse coherently, goal directed thoughts, no distractibility, or pre-occupation. She also denies suicidal/self-harm/homicidal ideation, psychosis, and paranoia.  Patient answered question appropriately.    Patient sister [name] states when patient is on the right medications and mood stabilizer she is calm, a completely different person than we are seeing here in the hospital.    Past Psychiatric History: bipolar  Risk to Self or Others: Risk to Self: No Risk to Others: No  Prior Inpatient Therapy: Yes  Prior Outpatient Therapy: No   [place] Scale:  Flowsheet Row ED to Hosp-Admission (Current) from 02/18/2023 in [facility] ED from 02/17/2023 in [facility] Department at [facility] ED to Hosp-Admission (Discharged) from 02/13/2023 in [place] [facility]  C-SSRS RISK CATEGORY No Risk No Risk No Risk       AIMS:  , , ,  ,   ASAM:    Substance Abuse:     Past Medical History:  Past Medical History:  Diagnosis Date   Allergy    Bipolar disorder (HCC)    Depression    Drug abuse (HCC)    history of, went to rehab 2015  GERD (gastroesophageal reflux disease)    Lung cancer (HCC)    lung ca dx 11/11- right upper lobe    Past Surgical History:  Procedure Laterality Date   ABDOMINAL AORTOGRAM W/LOWER EXTREMITY Bilateral 01/10/2022   Procedure: ABDOMINAL AORTOGRAM W/LOWER EXTREMITY;  Surgeon: Nada Libman, MD;  Location: [facility];  Service: Cardiovascular;  Laterality: Bilateral;   ANKLE FRACTURE SURGERY Left    HARDWARE REMOVAL Left 12/13/2021   Procedure: LEFT ANKLE HARDWARE REMOVAL;  Surgeon: Marcene Corning, MD;  Location: [facility];  Service:  Orthopedics;  Laterality: Left;   LUNG LOBECTOMY Right 2011   RUL removed for lung cancer   STYLOID PROCESS EXCISION Left 10/16/2014   Procedure: EXCISION LEFT STYLOID PROCESS;  Surgeon: Drema Halon, MD;  Location: [facility];  Service: ENT;  Laterality: Left;   TONSILLECTOMY Bilateral 10/16/2014   Procedure: BILATERAL TONSILLECTOMY;  Surgeon: Drema Halon, MD;  Location: [facility];  Service: ENT;  Laterality: Bilateral;   TUBAL LIGATION  1984   Family History:  Family History  Problem Relation Age of Onset   Cancer Mother        breast cancer   Cancer Father        stomach cancer   Diabetes Father    Cancer Sister        breast cancer   Diabetes Brother    Diabetes Brother    Cancer Sister        breast cancer    Social History:  Social History   Substance and Sexual Activity  Alcohol Use Not Currently   Comment: hx alcoholism.  none since 2017     Social History   Substance and Sexual Activity  Drug Use Not Currently   Types: Benzodiazepines   Comment: hx of xanax abuse. last use 2015    Social History   Socioeconomic History   Marital status: Single    Spouse name: Not on file   Number of children: Not on file   Years of education: Not on file   Highest education level: Not on file  Occupational History   Not on file  Tobacco Use   Smoking status: Former    Current packs/day: 1.00    Average packs/day: 1 pack/day for 40.0 years (40.0 ttl pk-yrs)    Types: Cigarettes   Smokeless tobacco: Never  Vaping Use   Vaping status: Former  Substance and Sexual Activity   Alcohol use: Not Currently    Comment: hx alcoholism.  none since 2017   Drug use: Not Currently    Types: Benzodiazepines    Comment: hx of xanax abuse. last use 2015   Sexual activity: Not on file  Other Topics Concern   Not on file  Social History Narrative   Not on file   Social Determinants of Health   Financial Resource Strain: Not on  file  Food Insecurity: No Food Insecurity (02/18/2023)   Hunger Vital Sign    Worried About Running Out of Food in the Last Year: Never true    Ran Out of Food in the Last Year: Never true  Transportation Needs: No Transportation Needs (02/18/2023)   PRAPARE - Administrator, Civil Service (Medical): No    Lack of Transportation (Non-Medical): No  Physical Activity: Not on file  Stress: Not on file  Social Connections: Unknown (07/05/2021)   Received from [facility], [facility]   Social Connection and Isolation Panel [  NHANES]    Frequency of Communication with Friends and Family: Never    Frequency of Social Gatherings with Friends and Family: Never    Attends Religious Services: Never    Database administrator or Organizations: No    Attends Banker Meetings: Never    Marital Status: Patient declined     Allergies:   Allergies  Allergen Reactions   Sulfa Antibiotics Swelling and Rash    Neck swelling    Labs:  Results for orders placed or performed during the hospital encounter of 02/18/23 (from the past 48 hour(s))  Glucose, capillary     Status: Abnormal   Collection Time: 02/20/23  8:06 PM  Result Value Ref Range   Glucose-Capillary 252 (H) 70 - 99 mg/dL    Comment: Glucose reference range applies only to samples taken after fasting for at least 8 hours.  Basic metabolic panel     Status: Abnormal   Collection Time: 02/21/23  5:10 AM  Result Value Ref Range   Sodium 138 135 - 145 mmol/L   Potassium 4.2 3.5 - 5.1 mmol/L   Chloride 103 98 - 111 mmol/L   CO2 27 22 - 32 mmol/L   Glucose, Bld 234 (H) 70 - 99 mg/dL    Comment: Glucose reference range applies only to samples taken after fasting for at least 8 hours.   BUN 10 8 - 23 mg/dL   Creatinine, Ser 1.61 0.44 - 1.00 mg/dL   Calcium 9.1 8.9 - 09.6 mg/dL   GFR, Estimated >04 >54 mL/min    Comment: (NOTE) Calculated using the CKD-EPI Creatinine Equation (2021)    Anion gap 8 5 - 15     Comment: Performed at [facility], [address] [place], [place] [address]  Glucose, capillary     Status: Abnormal   Collection Time: 02/21/23  7:56 AM  Result Value Ref Range   Glucose-Capillary 182 (H) 70 - 99 mg/dL    Comment: Glucose reference range applies only to samples taken after fasting for at least 8 hours.  Glucose, capillary     Status: Abnormal   Collection Time: 02/21/23 11:37 AM  Result Value Ref Range   Glucose-Capillary 383 (H) 70 - 99 mg/dL    Comment: Glucose reference range applies only to samples taken after fasting for at least 8 hours.  Glucose, capillary     Status: Abnormal   Collection Time: 02/21/23  3:50 PM  Result Value Ref Range   Glucose-Capillary 122 (H) 70 - 99 mg/dL    Comment: Glucose reference range applies only to samples taken after fasting for at least 8 hours.   Comment 1 Notify RN    Comment 2 Document in Chart   Glucose, capillary     Status: Abnormal   Collection Time: 02/21/23  9:12 PM  Result Value Ref Range   Glucose-Capillary 139 (H) 70 - 99 mg/dL    Comment: Glucose reference range applies only to samples taken after fasting for at least 8 hours.  Basic metabolic panel     Status: Abnormal   Collection Time: 02/22/23  5:05 AM  Result Value Ref Range   Sodium 142 135 - 145 mmol/L   Potassium 4.2 3.5 - 5.1 mmol/L   Chloride 111 98 - 111 mmol/L   CO2 26 22 - 32 mmol/L   Glucose, Bld 167 (H) 70 - 99 mg/dL    Comment: Glucose reference range applies only to samples taken after fasting for at  least 8 hours.   BUN 10 8 - 23 mg/dL   Creatinine, Ser 1.61 (L) 0.44 - 1.00 mg/dL   Calcium 8.7 (L) 8.9 - 10.3 mg/dL   GFR, Estimated >09 >60 mL/min    Comment: (NOTE) Calculated using the CKD-EPI Creatinine Equation (2021)    Anion gap 5 5 - 15    Comment: Performed at [facility] [place], [address], [place], [place] [address]  Glucose, capillary     Status: Abnormal   Collection Time: 02/22/23  7:30 AM  Result Value Ref Range    Glucose-Capillary 175 (H) 70 - 99 mg/dL    Comment: Glucose reference range applies only to samples taken after fasting for at least 8 hours.  Glucose, capillary     Status: Abnormal   Collection Time: 02/22/23 11:28 AM  Result Value Ref Range   Glucose-Capillary 130 (H) 70 - 99 mg/dL    Comment: Glucose reference range applies only to samples taken after fasting for at least 8 hours.    Current Facility-Administered Medications  Medication Dose Route Frequency Provider Last Rate Last Admin   acetaminophen (TYLENOL) tablet 650 mg  650 mg Oral Q6H PRN Emokpae, Ejiroghene E, MD       Or   acetaminophen (TYLENOL) suppository 650 mg  650 mg Rectal Q6H PRN Emokpae, Ejiroghene E, MD       apixaban (ELIQUIS) tablet 5 mg  5 mg Oral BID Emokpae, Ejiroghene E, MD   5 mg at 02/22/23 0825   busPIRone (BUSPAR) tablet 5 mg  5 mg Oral BID Emokpae, Ejiroghene E, MD   5 mg at 02/22/23 0823   gabapentin (NEURONTIN) capsule 300 mg  300 mg Oral TID Emokpae, Ejiroghene E, MD   300 mg at 02/22/23 0823   insulin aspart (novoLOG) injection 0-15 Units  0-15 Units Subcutaneous TID WC Emokpae, Ejiroghene E, MD   2 Units at 02/22/23 1235   insulin aspart (novoLOG) injection 0-5 Units  0-5 Units Subcutaneous QHS Emokpae, Ejiroghene E, MD   3 Units at 02/20/23 2230   insulin glargine-yfgn (SEMGLEE) injection 10 Units  10 Units Subcutaneous Daily Kc, Ramesh, MD   10 Units at 02/22/23 1036   lactulose (CHRONULAC) 10 GM/15ML solution 20 g  20 g Oral Daily PRN Emokpae, Ejiroghene E, MD       lithium carbonate capsule 600 mg  600 mg Oral Daily Emokpae, Ejiroghene E, MD   600 mg at 02/22/23 0824   LORazepam (ATIVAN) tablet 0.5 mg  0.5 mg Oral QHS PRN Emokpae, Ejiroghene E, MD   0.5 mg at 02/21/23 2206   melatonin tablet 9 mg  9 mg Oral QHS Emokpae, Ejiroghene E, MD   9 mg at 02/21/23 2206   methimazole (TAPAZOLE) tablet 5 mg  5 mg Oral BID Emokpae, Ejiroghene E, MD   5 mg at 02/21/23 2206   midodrine (PROAMATINE) tablet 5 mg   5 mg Oral TID WC Emokpae, Ejiroghene E, MD   5 mg at 02/22/23 1235   OLANZapine (ZYPREXA) tablet 10 mg  10 mg Oral QHS Emokpae, Ejiroghene E, MD   10 mg at 02/21/23 2206   ondansetron (ZOFRAN) tablet 4 mg  4 mg Oral Q6H PRN Emokpae, Ejiroghene E, MD       Or   ondansetron (ZOFRAN) injection 4 mg  4 mg Intravenous Q6H PRN Emokpae, Ejiroghene E, MD       oxyCODONE-acetaminophen (PERCOCET/ROXICET) 5-325 MG per tablet 1 tablet  1 tablet Oral Q4H PRN Rito Ehrlich,  Gokul, MD   1 tablet at 02/22/23 1435   And   oxyCODONE (Oxy IR/ROXICODONE) immediate release tablet 5 mg  5 mg Oral Q4H PRN Osvaldo Shipper, MD   5 mg at 02/22/23 1435   psyllium (HYDROCIL/METAMUCIL) 1 packet  1 packet Oral Daily Kc, Ramesh, MD       saccharomyces boulardii (FLORASTOR) capsule 250 mg  250 mg Oral Daily Emokpae, Ejiroghene E, MD   250 mg at 02/22/23 0823   tamsulosin (FLOMAX) capsule 0.4 mg  0.4 mg Oral Daily Emokpae, Ejiroghene E, MD   0.4 mg at 02/22/23 1610    Musculoskeletal:  Observed patient resting in bed.   Psychiatric Specialty Exam: Presentation  General Appearance:  Appropriate for Environment  Eye Contact: Good  Speech: Clear and Coherent  Speech Volume: Normal  Handedness: Right   Mood and Affect  Mood: Euthymic  Affect: Appropriate   Thought Process  Thought Processes: Coherent  Descriptions of Associations:Intact  Orientation:Full (Time, Place and Person)  Thought Content:WDL  History of Schizophrenia/Schizoaffective disorder:No data recorded Duration of Psychotic Symptoms:No data recorded Hallucinations:Hallucinations: None  Ideas of Reference:None  Suicidal Thoughts:Suicidal Thoughts: No  Homicidal Thoughts:Homicidal Thoughts: No   Sensorium  Memory: Immediate Good; Recent Good  Judgment: Good  Insight: Good   Executive Functions  Concentration: Good  Attention Span: Good  Recall: Good  Fund of Knowledge: Good  Language: Good   Psychomotor  Activity  Psychomotor Activity: Psychomotor Activity: Normal   Assets  Assets: Communication Skills; Desire for Improvement; Social Support    Sleep  Sleep: Sleep: Fair   Physical Exam: Physical Exam Vitals and nursing note reviewed. Exam conducted with a chaperone present.  Neurological:     Mental Status: She is alert.  Psychiatric:        Attention and Perception: Attention normal.        Mood and Affect: Mood normal.        Speech: Speech normal.        Behavior: Behavior is cooperative.        Thought Content: Thought content normal.        Cognition and Memory: Memory normal.        Judgment: Judgment normal.    Review of Systems  Psychiatric/Behavioral: Negative.     Blood pressure (!) 96/25, pulse 85, temperature 97.7 F (36.5 C), temperature source Oral, resp. rate 18, height 5\' 9"  (1.753 m), weight 102.1 kg, SpO2 95%. Body mass index is 33.24 kg/m.   Medical Decision Making: Patient is psychiatrically cleared. Patient case review and discussed with Dr. Rebecca Eaton, and patient does not meet inpatient criteria for inpatient psychiatric treatment. At time of discharge, patient denies SI, HI, AVH and can contract for safety. She demonstrated no overt evidence of psychosis or mania. Prior to discharge, she verbalized that they understood warning signs, triggers, and symptoms of worsening mental health and how to access emergency mental health care if they felt it was needed. Patient was instructed to call 911 or return to the emergency room if they experienced any concerning symptoms after discharge. Patient voiced understanding and agreed to the above.  Patient given resources to follow up with behavioral health urgent care for therapy and medication management. Patient denies access to weapons. Safety planning completed. Patient will follow up with outpatient psychiatry.    Neri Samek MOTLEY-MANGRUM, PMHNP 02/22/2023 3:09 PM

## 2023-02-22 NOTE — Discharge Summary (Deleted)
[facility] ED Discharge  02/22/2023 1:52 PM [name]  MRN:  [id number]  Principal Problem: Severe bipolar I disorder, most recent episode depressed [facility]) Discharge Diagnoses: Principal Problem:   Severe bipolar I disorder, most recent episode depressed (HCC) Active Problems:   UTI (urinary tract infection)   Controlled type 2 diabetes mellitus with hyperglycemia (HCC)   DVT (deep venous thrombosis) 09/08/22   UTI due to extended-spectrum beta lactamase (ESBL) producing Escherichia coli   HTN (hypertension)  Clinical Impression:  Final diagnoses:  ESBL (extended spectrum beta-lactamase) producing bacteria infection    Subjective:      ED Assessment Time Calculation: Start Time: 1330 Stop Time: 1345 Total Time in Minutes (Assessment Completion): 15   Past Psychiatric History:   Past Medical History:  Past Medical History:  Diagnosis Date   Allergy    Bipolar disorder (HCC)    Depression    Drug abuse (HCC)    history of, went to rehab 2015   GERD (gastroesophageal reflux disease)    Lung cancer (HCC)    lung ca dx 11/11- right upper lobe    Past Surgical History:  Procedure Laterality Date   ABDOMINAL AORTOGRAM W/LOWER EXTREMITY Bilateral 01/10/2022   Procedure: ABDOMINAL AORTOGRAM W/LOWER EXTREMITY;  Surgeon: Nada Libman, MD;  Location: [facility];  Service: Cardiovascular;  Laterality: Bilateral;   ANKLE FRACTURE SURGERY Left    HARDWARE REMOVAL Left 12/13/2021   Procedure: LEFT ANKLE HARDWARE REMOVAL;  Surgeon: Marcene Corning, MD;  Location: [facility];  Service: Orthopedics;  Laterality: Left;   LUNG LOBECTOMY Right 2011   RUL removed for lung cancer   STYLOID PROCESS EXCISION Left 10/16/2014   Procedure: EXCISION LEFT STYLOID PROCESS;  Surgeon: Drema Halon, MD;  Location: [facility];  Service: ENT;  Laterality: Left;   TONSILLECTOMY Bilateral 10/16/2014   Procedure: BILATERAL TONSILLECTOMY;  Surgeon: Drema Halon, MD;   Location: [facility];  Service: ENT;  Laterality: Bilateral;   TUBAL LIGATION  1984   Family History:  Family History  Problem Relation Age of Onset   Cancer Mother        breast cancer   Cancer Father        stomach cancer   Diabetes Father    Cancer Sister        breast cancer   Diabetes Brother    Diabetes Brother    Cancer Sister        breast cancer   Family Psychiatric  History:  Social History:  Social History   Substance and Sexual Activity  Alcohol Use Not Currently   Comment: hx alcoholism.  none since 2017     Social History   Substance and Sexual Activity  Drug Use Not Currently   Types: Benzodiazepines   Comment: hx of xanax abuse. last use 2015    Social History   Socioeconomic History   Marital status: Single    Spouse name: Not on file   Number of children: Not on file   Years of education: Not on file   Highest education level: Not on file  Occupational History   Not on file  Tobacco Use   Smoking status: Former    Current packs/day: 1.00    Average packs/day: 1 pack/day for 40.0 years (40.0 ttl pk-yrs)    Types: Cigarettes   Smokeless tobacco: Never  Vaping Use   Vaping status: Former  Substance and Sexual Activity   Alcohol use: Not  Currently    Comment: hx alcoholism.  none since 2017   Drug use: Not Currently    Types: Benzodiazepines    Comment: hx of xanax abuse. last use 2015   Sexual activity: Not on file  Other Topics Concern   Not on file  Social History Narrative   Not on file   Social Determinants of Health   Financial Resource Strain: Not on file  Food Insecurity: No Food Insecurity (02/18/2023)   Hunger Vital Sign    Worried About Running Out of Food in the Last Year: Never true    Ran Out of Food in the Last Year: Never true  Transportation Needs: No Transportation Needs (02/18/2023)   PRAPARE - Administrator, Civil Service (Medical): No    Lack of Transportation (Non-Medical): No   Physical Activity: Not on file  Stress: Not on file  Social Connections: Unknown (07/05/2021)   [facility]   Social Connection and Isolation Panel [NHANES]    Frequency of Communication with Friends and Family: Never    Frequency of Social Gatherings with Friends and Family: Never    Attends Religious Services: Never    Database administrator or Organizations: No    Attends Engineer, structural: Never    Marital Status: Patient declined      Current Medications: Current Facility-Administered Medications  Medication Dose Route Frequency Provider Last Rate Last Admin   acetaminophen (TYLENOL) tablet 650 mg  650 mg Oral Q6H PRN Emokpae, Ejiroghene E, MD       Or   acetaminophen (TYLENOL) suppository 650 mg  650 mg Rectal Q6H PRN Emokpae, Ejiroghene E, MD       apixaban (ELIQUIS) tablet 5 mg  5 mg Oral BID Emokpae, Ejiroghene E, MD   5 mg at 02/22/23 0825   busPIRone (BUSPAR) tablet 5 mg  5 mg Oral BID Emokpae, Ejiroghene E, MD   5 mg at 02/22/23 0823   gabapentin (NEURONTIN) capsule 300 mg  300 mg Oral TID Emokpae, Ejiroghene E, MD   300 mg at 02/22/23 0823   insulin aspart (novoLOG) injection 0-15 Units  0-15 Units Subcutaneous TID WC Emokpae, Ejiroghene E, MD   2 Units at 02/22/23 1235   insulin aspart (novoLOG) injection 0-5 Units  0-5 Units Subcutaneous QHS Emokpae, Ejiroghene E, MD   3 Units at 02/20/23 2230   insulin glargine-yfgn (SEMGLEE) injection 10 Units  10 Units Subcutaneous Daily Kc, Ramesh, MD   10 Units at 02/22/23 1036   lactulose (CHRONULAC) 10 GM/15ML solution 20 g  20 g Oral Daily PRN Emokpae, Ejiroghene E, MD       lithium carbonate capsule 600 mg  600 mg Oral Daily Emokpae, Ejiroghene E, MD   600 mg at 02/22/23 0824   LORazepam (ATIVAN) tablet 0.5 mg  0.5 mg Oral QHS PRN Emokpae, Ejiroghene E, MD   0.5 mg at 02/21/23 2206   melatonin tablet 9 mg  9 mg Oral QHS Emokpae, Ejiroghene E, MD   9 mg at 02/21/23 2206    methimazole (TAPAZOLE) tablet 5 mg  5 mg Oral BID Emokpae, Ejiroghene E, MD   5 mg at 02/21/23 2206   midodrine (PROAMATINE) tablet 5 mg  5 mg Oral TID WC Emokpae, Ejiroghene E, MD   5 mg at 02/22/23 1235   OLANZapine (ZYPREXA) tablet 10 mg  10 mg Oral QHS Emokpae, Ejiroghene E, MD   10 mg at 02/21/23 2206  ondansetron (ZOFRAN) tablet 4 mg  4 mg Oral Q6H PRN Emokpae, Ejiroghene E, MD       Or   ondansetron (ZOFRAN) injection 4 mg  4 mg Intravenous Q6H PRN Emokpae, Ejiroghene E, MD       oxyCODONE-acetaminophen (PERCOCET/ROXICET) 5-325 MG per tablet 1 tablet  1 tablet Oral Q4H PRN Osvaldo Shipper, MD   1 tablet at 02/22/23 1035   And   oxyCODONE (Oxy IR/ROXICODONE) immediate release tablet 5 mg  5 mg Oral Q4H PRN Osvaldo Shipper, MD   5 mg at 02/22/23 1035   psyllium (HYDROCIL/METAMUCIL) 1 packet  1 packet Oral Daily Kc, Dayna Barker, MD       saccharomyces boulardii (FLORASTOR) capsule 250 mg  250 mg Oral Daily Emokpae, Ejiroghene E, MD   250 mg at 02/22/23 2952   tamsulosin (FLOMAX) capsule 0.4 mg  0.4 mg Oral Daily Emokpae, Ejiroghene E, MD   0.4 mg at 02/22/23 8413   PTA Medications: Medications Prior to Admission  Medication Sig Dispense Refill Last Dose   acetaminophen (TYLENOL) 325 MG tablet Take 650 mg by mouth every 4 (four) hours as needed for mild pain.   02/18/2023 at 0645   ascorbic acid (VITAMIN C) 500 MG tablet Take 500 mg by mouth 2 (two) times daily.   02/18/2023   atenolol (TENORMIN) 25 MG tablet Take 1 tablet (25 mg total) by mouth daily. 30 tablet 3 02/17/2023   busPIRone (BUSPAR) 5 MG tablet Take 5 mg by mouth 2 (two) times daily.   02/18/2023   calcium carbonate (TUMS - DOSED IN MG ELEMENTAL CALCIUM) 500 MG chewable tablet Chew 1 tablet by mouth every 5 (five) hours as needed for indigestion or heartburn.   unknown   cetirizine (ZYRTEC) 10 MG tablet Take 10 mg by mouth daily. For allergies   02/13/2023   ELIQUIS 5 MG TABS tablet Take 5 mg by mouth 2 (two) times daily.   02/18/2023    ertapenem (INVANZ) IVPB Inject 1 g into the vein daily for 6 days. Indication:  ESBL Bacteremia First Dose: Yes Last Day of Therapy:  02/21/23  Labs - Once weekly:  CBC/D and BMP, Labs - Once weekly: ESR and CRP 6 Units 0 unknown   folic acid (FOLVITE) 1 MG tablet Take 1 mg by mouth daily.   02/18/2023   gabapentin (NEURONTIN) 100 MG capsule Take 300 mg by mouth 3 (three) times daily.   02/18/2023   Heparin Sod, Pork, Lock Flush (HEPARIN LOCK FLUSH IV) Inject 5 mLs into the vein daily. Heparin Flush Solution 5 mls of 10 units/ml; use for ABT PICC line flush after medication administration   unknown   hydrochlorothiazide (HYDRODIURIL) 25 MG tablet Take 0.5 tablets (12.5 mg total) by mouth daily. 45 tablet 2 02/18/2023   hydrOXYzine (ATARAX) 25 MG tablet Take 1 tablet (25 mg total) by mouth every 6 (six) hours as needed for itching. 12 tablet 0 unknown   insulin aspart (FIASP FLEXTOUCH) 100 UNIT/ML FlexTouch Pen Inject 0-10 Units into the skin 4 (four) times daily -  before meals and at bedtime. Short acting insulin (Aspart) per sliding scale 0-10 ---:-- Insulin injection 0-10 Units 0-10 Units Subcutaneous, 3 times daily with meals CBG 70 - 120: 0 unit CBG 121 - 150: 0 unit  CBG 151 - 200: 1 unit CBG 201 - 250: 2 units CBG 251 - 300: 4 units CBG 301 - 350: 6 units  CBG 351 - 400: 8 units  CBG > 400:  10 units 15 mL 1 02/18/2023 at 1130   Ketoconazole 2 % FOAM Apply 1 Application topically daily. To scalp, for seborrheic dermatitis.   02/18/2023   lactulose (CHRONULAC) 10 GM/15ML solution Take 30 mLs by mouth daily as needed for moderate constipation.   unknown   lidocaine (LIDODERM) 5 % Place 1 patch onto the skin daily. Remove & Discard patch within 12 hours or as directed by MD; Apply to LOWER LEG   02/13/2023   lidocaine 4 % dressing Apply 1 Application topically every 12 (twelve) hours as needed (pain).   unknown   lithium carbonate 300 MG capsule Take 600 mg by mouth daily.   02/18/2023   loperamide  (IMODIUM A-D) 2 MG tablet Take 2 mg by mouth every 6 (six) hours as needed for diarrhea or loose stools.   unknown   Melatonin 10 MG TABS Take 10 mg by mouth at bedtime.   02/16/2023   metFORMIN (GLUCOPHAGE) 500 MG tablet Take 1 pill a day (Patient taking differently: Take 500 mg by mouth daily with breakfast. Take 1 pill a day) 30 tablet 0 02/18/2023   methimazole (TAPAZOLE) 5 MG tablet Take 1 tablet (5 mg total) by mouth 2 (two) times daily. 180 tablet 2 02/18/2023   midodrine (PROAMATINE) 5 MG tablet Take 1 tablet (5 mg total) by mouth 3 (three) times daily with meals. 90 tablet 3 02/18/2023 at 1130   Multiple Vitamin (MULTIVITAMIN ADULT PO) Take 1 tablet by mouth daily.   02/18/2023   OLANZapine (ZYPREXA) 10 MG tablet Take 10 mg by mouth at bedtime.   02/16/2023   ondansetron (ZOFRAN) 4 MG tablet Take 4 mg by mouth every 8 (eight) hours as needed.   02/18/2023 at 0947   potassium chloride 20 MEQ/15ML (10%) SOLN Take 10 mEq by mouth daily.   unknown   pyrithione zinc (SELSUN BLUE ITCHY DRY SCALP) 1 % shampoo Apply topically daily as needed for itching. (Patient taking differently: Apply 1 Application topically daily as needed for itching.) 400 mL 0 unknown   saccharomyces boulardii (FLORASTOR) 250 MG capsule Take 250 mg by mouth daily.   02/18/2023   senna-docusate (SENOKOT-S) 8.6-50 MG tablet Take 2 tablets by mouth 2 (two) times daily. May hold for loose stools   02/18/2023   tamsulosin (FLOMAX) 0.4 MG CAPS capsule Take 0.4 mg by mouth daily.   02/18/2023   [DISCONTINUED] LORazepam (ATIVAN) 0.5 MG tablet Take 1 tablet (0.5 mg total) by mouth at bedtime as needed for anxiety or sleep. 15 tablet 0 02/16/2023   [DISCONTINUED] oxyCODONE-acetaminophen (PERCOCET) 10-325 MG tablet Take 1 tablet by mouth every 4 (four) hours as needed. 15 tablet 0 02/18/2023 at 0922   [DISCONTINUED] zolpidem (AMBIEN) 5 MG tablet Take 1 tablet (5 mg total) by mouth at bedtime as needed for sleep. 30 tablet 0 unknown    [place]  Scale:  Flowsheet Row ED to Hosp-Admission (Current) from 02/18/2023 in [facility] from 02/17/2023 in [facility] ED to Hosp-Admission (Discharged) from 02/13/2023 in [facility]  C-SSRS RISK CATEGORY No Risk No Risk No Risk       Musculoskeletal:  Psychiatric Specialty Exam: Presentation  General Appearance:  Appropriate for Environment  Eye Contact: Good  Speech: Clear and Coherent  Speech Volume: Normal  Handedness: Right   Mood and Affect  Mood: Euthymic  Affect: Appropriate   Thought Process  Thought Processes: Coherent  Descriptions of Associations:Intact  Orientation:Full (Time, Place and Person)  Thought Content:WDL  History of Schizophrenia/Schizoaffective disorder:No data recorded Duration of Psychotic Symptoms:No data recorded Hallucinations:Hallucinations: None  Ideas of Reference:None  Suicidal Thoughts:Suicidal Thoughts: No  Homicidal Thoughts:Homicidal Thoughts: No   Sensorium  Memory: Immediate Good; Recent Good  Judgment: Good  Insight: Good   Executive Functions  Concentration: Good  Attention Span: Good  Recall: Good  Fund of Knowledge: Good  Language: Good   Psychomotor Activity  Psychomotor Activity: Psychomotor Activity: Normal   Assets  Assets: Communication Skills; Desire for Improvement; Social Support   Sleep  Sleep: Sleep: Fair    Physical Exam: Physical Exam ROS Blood pressure (!) 96/25, pulse 85, temperature 97.7 F (36.5 C), temperature source Oral, resp. rate 18, height 5\' 9"  (1.753 m), weight 102.1 kg, SpO2 95%. Body mass index is 33.24 kg/m.      Medical Decision Making: Patient is psychiatrically cleared. Patient case review and discussed with Dr. Lucianne Muss, and patient does not meet inpatient criteria for inpatient psychiatric treatment. At time of discharge, patient denies SI, HI, AVH and can  contract for safety. He demonstrated no overt evidence of psychosis or mania. Prior to discharge, he verbalized that they understood warning signs, triggers, and symptoms of worsening mental health and how to access emergency mental health care if they felt it was needed. Patient was instructed to call 911 or return to the emergency room if they experienced any concerning symptoms after discharge. Patient voiced understanding and agreed to the above.  Patient given resources to follow up with behavioral health urgent care for therapy and medication management. Patient denies access to weapons. Safety planning completed.     Jolly Bleicher MOTLEY-MANGRUM, PMHNP 02/22/2023, 1:52 PM

## 2023-02-22 NOTE — BH Assessment (Signed)
[facility] attempted to assess pt but was referred pt back to TTS due to pt being on the medical floor. TTS attempted to meet pt at 0506 and was informed by [facility], RN pt was appeared drowsy. Pt to be seen by psychiatry in AM.

## 2023-02-22 NOTE — Plan of Care (Signed)
  Problem: Health Behavior/Discharge Planning: Goal: Ability to manage health-related needs will improve Outcome: Progressing   

## 2023-02-22 NOTE — Discharge Instructions (Signed)
Please have patient follow up with her outpatient psychiatrist for medication management. Safety plan in place.    Discharge recommendations:  Patient is to take medications as prescribed. Please see information for follow-up appointment with psychiatry and therapy. Please follow up with your primary care provider for all medical related needs.   Therapy: We recommend that patient participate in individual therapy to address mental health concerns.  Medications: The patient or guardian is to contact a medical professional and/or outpatient provider to address any new side effects that develop. The patient or guardian should update outpatient providers of any new medications and/or medication changes.   Atypical antipsychotics: If you are prescribed an atypical antipsychotic, it is recommended that your height, weight, BMI, blood pressure, fasting lipid panel, and fasting blood sugar be monitored by your outpatient providers.  Safety:  The patient should abstain from use of illicit substances/drugs and abuse of any medications. If symptoms worsen or do not continue to improve or if the patient becomes actively suicidal or homicidal then it is recommended that the patient return to the closest hospital emergency department, the [facility], or call 911 for further evaluation and treatment. National Suicide Prevention Lifeline 1-800-SUICIDE or (415)284-6676.  About 988 988 offers 24/7 access to trained crisis counselors who can help people experiencing mental health-related distress. People can call or text 988 or chat 988lifeline.org for themselves or if they are worried about a loved one who may need crisis support.  Crisis Mobile: [facility]:                     (321)683-9474 (for crisis response 24 hours a day) [facility] Hotline:                                            9144227485

## 2023-02-22 NOTE — Progress Notes (Signed)
Pt transported to [place] [facility] via EMS. IV and pur wick removed.

## 2023-02-23 LAB — THYROID ANTIBODIES
Thyroglobulin Antibody: <1 [IU]/mL (ref 0.0–0.9)
Thyroperoxidase Ab SerPl-aCnc: 25 [IU]/mL (ref 0–34)

## 2023-05-01 ENCOUNTER — Emergency Department (HOSPITAL_COMMUNITY): Payer: Medicare Other

## 2023-05-01 ENCOUNTER — Other Ambulatory Visit: Payer: Self-pay

## 2023-05-01 ENCOUNTER — Inpatient Hospital Stay (HOSPITAL_COMMUNITY)
Admission: EM | Admit: 2023-05-01 | Discharge: 2023-05-07 | DRG: 872 | Disposition: A | Discharge status: Skilled Nursing Facility | Payer: Medicare Other | Source: Skilled Nursing Facility | Attending: Family Medicine | Admitting: Family Medicine

## 2023-05-01 ENCOUNTER — Encounter (HOSPITAL_COMMUNITY): Payer: Self-pay | Admitting: Emergency Medicine

## 2023-05-01 DIAGNOSIS — Z79899 Other long term (current) drug therapy: Secondary | ICD-10-CM

## 2023-05-01 DIAGNOSIS — Z1612 Extended spectrum beta lactamase (ESBL) resistance: Secondary | ICD-10-CM | POA: Diagnosis present

## 2023-05-01 DIAGNOSIS — N3091 Cystitis, unspecified with hematuria: Secondary | ICD-10-CM | POA: Diagnosis present

## 2023-05-01 DIAGNOSIS — Z7401 Bed confinement status: Secondary | ICD-10-CM | POA: Diagnosis not present

## 2023-05-01 DIAGNOSIS — Z66 Do not resuscitate: Secondary | ICD-10-CM | POA: Diagnosis present

## 2023-05-01 DIAGNOSIS — A419 Sepsis, unspecified organism: Secondary | ICD-10-CM | POA: Diagnosis present

## 2023-05-01 DIAGNOSIS — E041 Nontoxic single thyroid nodule: Secondary | ICD-10-CM | POA: Diagnosis present

## 2023-05-01 DIAGNOSIS — Z7901 Long term (current) use of anticoagulants: Secondary | ICD-10-CM | POA: Diagnosis not present

## 2023-05-01 DIAGNOSIS — K5900 Constipation, unspecified: Secondary | ICD-10-CM | POA: Diagnosis not present

## 2023-05-01 DIAGNOSIS — E0590 Thyrotoxicosis, unspecified without thyrotoxic crisis or storm: Secondary | ICD-10-CM | POA: Diagnosis present

## 2023-05-01 DIAGNOSIS — G8929 Other chronic pain: Secondary | ICD-10-CM | POA: Diagnosis present

## 2023-05-01 DIAGNOSIS — N3001 Acute cystitis with hematuria: Secondary | ICD-10-CM | POA: Diagnosis not present

## 2023-05-01 DIAGNOSIS — R6520 Severe sepsis without septic shock: Secondary | ICD-10-CM | POA: Diagnosis present

## 2023-05-01 DIAGNOSIS — K5289 Other specified noninfective gastroenteritis and colitis: Secondary | ICD-10-CM | POA: Diagnosis present

## 2023-05-01 DIAGNOSIS — K219 Gastro-esophageal reflux disease without esophagitis: Secondary | ICD-10-CM | POA: Diagnosis present

## 2023-05-01 DIAGNOSIS — R7881 Bacteremia: Secondary | ICD-10-CM | POA: Diagnosis not present

## 2023-05-01 DIAGNOSIS — K5909 Other constipation: Secondary | ICD-10-CM | POA: Diagnosis not present

## 2023-05-01 DIAGNOSIS — Z800 Family history of malignant neoplasm of digestive organs: Secondary | ICD-10-CM

## 2023-05-01 DIAGNOSIS — N390 Urinary tract infection, site not specified: Secondary | ICD-10-CM | POA: Diagnosis not present

## 2023-05-01 DIAGNOSIS — F319 Bipolar disorder, unspecified: Secondary | ICD-10-CM | POA: Diagnosis present

## 2023-05-01 DIAGNOSIS — E785 Hyperlipidemia, unspecified: Secondary | ICD-10-CM | POA: Diagnosis present

## 2023-05-01 DIAGNOSIS — Z87440 Personal history of urinary (tract) infections: Secondary | ICD-10-CM

## 2023-05-01 DIAGNOSIS — E1165 Type 2 diabetes mellitus with hyperglycemia: Secondary | ICD-10-CM | POA: Diagnosis present

## 2023-05-01 DIAGNOSIS — Z79891 Long term (current) use of opiate analgesic: Secondary | ICD-10-CM

## 2023-05-01 DIAGNOSIS — Z882 Allergy status to sulfonamides status: Secondary | ICD-10-CM | POA: Diagnosis not present

## 2023-05-01 DIAGNOSIS — Z7984 Long term (current) use of oral hypoglycemic drugs: Secondary | ICD-10-CM

## 2023-05-01 DIAGNOSIS — Z794 Long term (current) use of insulin: Secondary | ICD-10-CM

## 2023-05-01 DIAGNOSIS — Z85118 Personal history of other malignant neoplasm of bronchus and lung: Secondary | ICD-10-CM

## 2023-05-01 DIAGNOSIS — Z902 Acquired absence of lung [part of]: Secondary | ICD-10-CM

## 2023-05-01 DIAGNOSIS — F1120 Opioid dependence, uncomplicated: Secondary | ICD-10-CM | POA: Diagnosis present

## 2023-05-01 DIAGNOSIS — Z833 Family history of diabetes mellitus: Secondary | ICD-10-CM

## 2023-05-01 DIAGNOSIS — F17210 Nicotine dependence, cigarettes, uncomplicated: Secondary | ICD-10-CM | POA: Diagnosis present

## 2023-05-01 DIAGNOSIS — K5903 Drug induced constipation: Secondary | ICD-10-CM | POA: Diagnosis present

## 2023-05-01 DIAGNOSIS — B9620 Unspecified Escherichia coli [E. coli] as the cause of diseases classified elsewhere: Secondary | ICD-10-CM | POA: Diagnosis not present

## 2023-05-01 DIAGNOSIS — T402X5A Adverse effect of other opioids, initial encounter: Secondary | ICD-10-CM | POA: Diagnosis present

## 2023-05-01 DIAGNOSIS — A4151 Sepsis due to Escherichia coli [E. coli]: Principal | ICD-10-CM | POA: Diagnosis present

## 2023-05-01 DIAGNOSIS — Z803 Family history of malignant neoplasm of breast: Secondary | ICD-10-CM

## 2023-05-01 DIAGNOSIS — Z86718 Personal history of other venous thrombosis and embolism: Secondary | ICD-10-CM

## 2023-05-01 LAB — CBC WITH DIFFERENTIAL/PLATELET
Abs Immature Granulocytes: 0.02 10*3/uL (ref 0.00–0.07)
Basophils Absolute: 0 10*3/uL (ref 0.0–0.1)
Basophils Relative: 0 %
Eosinophils Absolute: 0 10*3/uL (ref 0.0–0.5)
Eosinophils Relative: 0 %
HCT: 45.6 % (ref 36.0–46.0)
Hemoglobin: 14.7 g/dL (ref 12.0–15.0)
Immature Granulocytes: 0 %
Lymphocytes Relative: 4 %
Lymphs Abs: 0.3 10*3/uL — ABNORMAL LOW (ref 0.7–4.0)
MCH: 30.1 pg (ref 26.0–34.0)
MCHC: 32.2 g/dL (ref 30.0–36.0)
MCV: 93.4 fL (ref 80.0–100.0)
Monocytes Absolute: 0.1 10*3/uL (ref 0.1–1.0)
Monocytes Relative: 2 %
Neutro Abs: 6.5 10*3/uL (ref 1.7–7.7)
Neutrophils Relative %: 94 %
Platelets: 195 10*3/uL (ref 150–400)
RBC: 4.88 MIL/uL (ref 3.87–5.11)
RDW: 13.9 % (ref 11.5–15.5)
WBC: 7 10*3/uL (ref 4.0–10.5)
nRBC: 0 % (ref 0.0–0.2)

## 2023-05-01 LAB — COMPREHENSIVE METABOLIC PANEL
ALT: 28 U/L (ref 0–44)
AST: 18 U/L (ref 15–41)
Albumin: 3.7 g/dL (ref 3.5–5.0)
Alkaline Phosphatase: 104 U/L (ref 38–126)
Anion gap: 12 (ref 5–15)
BUN: 15 mg/dL (ref 8–23)
CO2: 23 mmol/L (ref 22–32)
Calcium: 9.7 mg/dL (ref 8.9–10.3)
Chloride: 103 mmol/L (ref 98–111)
Creatinine, Ser: 0.54 mg/dL (ref 0.44–1.00)
GFR, Estimated: >60 mL/min (ref >60)
Glucose, Bld: 184 mg/dL — ABNORMAL HIGH (ref 70–99)
Potassium: 3.8 mmol/L (ref 3.5–5.1)
Sodium: 138 mmol/L (ref 135–145)
Total Bilirubin: 0.7 mg/dL (ref <1.2)
Total Protein: 7.5 g/dL (ref 6.5–8.1)

## 2023-05-01 LAB — LACTIC ACID, PLASMA
Lactic Acid, Venous: 2.6 mmol/L (ref 0.5–1.9)
Lactic Acid, Venous: 1.8 mmol/L (ref 0.5–1.9)

## 2023-05-01 LAB — URINALYSIS, W/ REFLEX TO CULTURE (INFECTION SUSPECTED)
Bilirubin Urine: NEGATIVE
Glucose, UA: NEGATIVE mg/dL
Ketones, ur: NEGATIVE mg/dL
Nitrite: NEGATIVE
Protein, ur: 30 mg/dL — AB
Specific Gravity, Urine: 1.012 (ref 1.005–1.030)
WBC, UA: >50 WBC/hpf (ref 0–5)
pH: 6 (ref 5.0–8.0)

## 2023-05-01 LAB — PROTIME-INR
INR: 1 (ref 0.8–1.2)
Prothrombin Time: 13.5 s (ref 11.4–15.2)

## 2023-05-01 LAB — APTT: aPTT: 27 s (ref 24–36)

## 2023-05-01 MED ORDER — METHIMAZOLE 5 MG PO TABS: 5.0000 mg | ORAL_TABLET | Freq: Two times a day (BID) | ORAL | Status: DC

## 2023-05-01 MED ORDER — ACETAMINOPHEN 325 MG PO TABS
650.0000 mg | ORAL_TABLET | Freq: Four times a day (QID) | ORAL | Status: DC | PRN
  Administered 2023-05-01 – 2023-05-06 (×5): 650 mg via ORAL
  Filled 2023-05-01 (×5): qty 2

## 2023-05-01 MED ORDER — APIXABAN 5 MG PO TABS
5.0000 mg | ORAL_TABLET | Freq: Two times a day (BID) | ORAL | Status: DC
Start: 2023-05-01 — End: 2023-05-02

## 2023-05-01 MED ORDER — LIDOCAINE 5 % EX PTCH
1.0000 | MEDICATED_PATCH | CUTANEOUS | Status: DC
  Administered 2023-05-01 – 2023-05-06 (×6): 1 via TRANSDERMAL
  Filled 2023-05-01 (×6): qty 1

## 2023-05-01 MED ORDER — HYDROXYZINE HCL 25 MG PO TABS: 25.0000 mg | ORAL_TABLET | Freq: Four times a day (QID) | ORAL | Status: DC | PRN

## 2023-05-01 MED ORDER — SACCHAROMYCES BOULARDII 250 MG PO CAPS
250.0000 mg | ORAL_CAPSULE | Freq: Every day | ORAL | Status: DC
  Administered 2023-05-02 – 2023-05-07 (×6): 250 mg via ORAL
  Filled 2023-05-01 (×6): qty 1

## 2023-05-01 MED ORDER — ZOLPIDEM TARTRATE 5 MG PO TABS
5.0000 mg | ORAL_TABLET | Freq: Every evening | ORAL | Status: DC | PRN
  Administered 2023-05-01 – 2023-05-06 (×5): 5 mg via ORAL
  Filled 2023-05-01 (×5): qty 1

## 2023-05-01 MED ORDER — LACTULOSE 10 GM/15ML PO SOLN: 20.0000 g | Freq: Every day | ORAL | Status: DC | PRN

## 2023-05-01 MED ORDER — ORAL CARE MOUTH RINSE: 15.0000 mL | OROMUCOSAL | Status: DC | PRN

## 2023-05-01 MED ORDER — CALCIUM CARBONATE ANTACID 500 MG PO CHEW: 1.0000 | CHEWABLE_TABLET | ORAL | Status: DC | PRN

## 2023-05-01 MED ORDER — GABAPENTIN 300 MG PO CAPS
300.0000 mg | ORAL_CAPSULE | Freq: Three times a day (TID) | ORAL | Status: DC
Start: 2023-05-01 — End: 2023-05-02

## 2023-05-01 MED ORDER — OXYCODONE-ACETAMINOPHEN 5-325 MG PO TABS
2.0000 | ORAL_TABLET | Freq: Once | ORAL | Status: AC
  Administered 2023-05-01: 2 via ORAL
  Filled 2023-05-01: qty 2

## 2023-05-01 MED ORDER — TAMSULOSIN HCL 0.4 MG PO CAPS
0.4000 mg | ORAL_CAPSULE | Freq: Every day | ORAL | Status: DC
  Administered 2023-05-02 – 2023-05-07 (×6): 0.4 mg via ORAL
  Filled 2023-05-01 (×6): qty 1

## 2023-05-01 MED ORDER — ACETAMINOPHEN 650 MG RE SUPP: 650.0000 mg | Freq: Four times a day (QID) | RECTAL | Status: DC | PRN

## 2023-05-01 MED ORDER — POLYETHYLENE GLYCOL 3350 17 G PO PACK: 17.0000 g | PACK | Freq: Every day | ORAL | Status: DC | PRN

## 2023-05-01 MED ORDER — LORATADINE 10 MG PO TABS
10.0000 mg | ORAL_TABLET | Freq: Every day | ORAL | Status: DC
  Administered 2023-05-02 – 2023-05-07 (×6): 10 mg via ORAL
  Filled 2023-05-01 (×6): qty 1

## 2023-05-01 MED ORDER — MORPHINE SULFATE (PF) 4 MG/ML IV SOLN
4.0000 mg | Freq: Once | INTRAVENOUS | Status: AC
Start: 2023-05-01 — End: 2023-05-01
  Administered 2023-05-01: 4 mg via INTRAVENOUS
  Filled 2023-05-01: qty 1

## 2023-05-01 MED ORDER — ONDANSETRON HCL 4 MG/2ML IJ SOLN
4.0000 mg | Freq: Four times a day (QID) | INTRAMUSCULAR | Status: DC | PRN
  Administered 2023-05-01: 4 mg via INTRAVENOUS
  Filled 2023-05-01: qty 2

## 2023-05-01 MED ORDER — OXYCODONE-ACETAMINOPHEN 10-325 MG PO TABS: 1.0000 | ORAL_TABLET | ORAL | Status: DC | PRN

## 2023-05-01 MED ORDER — OXYCODONE HCL 5 MG PO TABS
10.0000 mg | ORAL_TABLET | ORAL | Status: DC | PRN
  Administered 2023-05-01 – 2023-05-07 (×28): 10 mg via ORAL
  Filled 2023-05-01 (×29): qty 2

## 2023-05-01 MED ORDER — OLANZAPINE 5 MG PO TABS: 10.0000 mg | ORAL_TABLET | Freq: Every day | ORAL | Status: DC

## 2023-05-01 MED ORDER — BASAGLAR KWIKPEN 100 UNIT/ML ~~LOC~~ SOPN: 10.0000 [IU] | PEN_INJECTOR | Freq: Every day | SUBCUTANEOUS | Status: DC

## 2023-05-01 MED ORDER — FOLIC ACID 1 MG PO TABS
1.0000 mg | ORAL_TABLET | Freq: Every day | ORAL | Status: DC
  Administered 2023-05-02 – 2023-05-07 (×6): 1 mg via ORAL
  Filled 2023-05-01 (×6): qty 1

## 2023-05-01 MED ORDER — INSULIN ASPART 100 UNIT/ML IJ SOLN
0.0000 [IU] | Freq: Three times a day (TID) | INTRAMUSCULAR | Status: DC
  Administered 2023-05-02 (×2): 2 [IU] via SUBCUTANEOUS
  Administered 2023-05-02 – 2023-05-03 (×2): 1 [IU] via SUBCUTANEOUS
  Administered 2023-05-03 (×2): 2 [IU] via SUBCUTANEOUS
  Administered 2023-05-04: 1 [IU] via SUBCUTANEOUS
  Administered 2023-05-04 (×2): 2 [IU] via SUBCUTANEOUS
  Administered 2023-05-05 (×2): 1 [IU] via SUBCUTANEOUS
  Administered 2023-05-06: 2 [IU] via SUBCUTANEOUS
  Administered 2023-05-06: 1 [IU] via SUBCUTANEOUS
  Administered 2023-05-07: 5 [IU] via SUBCUTANEOUS
  Administered 2023-05-07: 1 [IU] via SUBCUTANEOUS

## 2023-05-01 MED ORDER — ACETAMINOPHEN 325 MG PO TABS
325.0000 mg | ORAL_TABLET | ORAL | Status: DC | PRN
  Administered 2023-05-04 – 2023-05-07 (×4): 325 mg via ORAL
  Filled 2023-05-01 (×5): qty 1

## 2023-05-01 MED ORDER — SODIUM CHLORIDE 0.9 % IV SOLN
1.0000 g | Freq: Three times a day (TID) | INTRAVENOUS | Status: DC
  Administered 2023-05-01 – 2023-05-04 (×9): 1 g via INTRAVENOUS
  Filled 2023-05-01 (×9): qty 20

## 2023-05-01 MED ORDER — LITHIUM CARBONATE 150 MG PO CAPS
600.0000 mg | ORAL_CAPSULE | Freq: Every day | ORAL | Status: DC
  Administered 2023-05-03 – 2023-05-07 (×5): 600 mg via ORAL
  Filled 2023-05-01 (×6): qty 4

## 2023-05-01 MED ORDER — MIDODRINE HCL 5 MG PO TABS
5.0000 mg | ORAL_TABLET | Freq: Three times a day (TID) | ORAL | Status: DC
Start: 2023-05-02 — End: 2023-05-02

## 2023-05-01 MED ORDER — BUSPIRONE HCL 5 MG PO TABS
5.0000 mg | ORAL_TABLET | Freq: Two times a day (BID) | ORAL | Status: DC
Start: 2023-05-01 — End: 2023-05-02
  Administered 2023-05-01: 5 mg via ORAL
  Filled 2023-05-01: qty 1

## 2023-05-01 MED ORDER — ONDANSETRON HCL 4 MG PO TABS: 4.0000 mg | ORAL_TABLET | Freq: Four times a day (QID) | ORAL | Status: DC | PRN

## 2023-05-01 MED ORDER — IOHEXOL 300 MG/ML  SOLN
100.0000 mL | Freq: Once | INTRAMUSCULAR | Status: AC | PRN
  Administered 2023-05-01: 100 mL via INTRAVENOUS

## 2023-05-01 MED ORDER — MELATONIN 3 MG PO TABS
9.0000 mg | ORAL_TABLET | Freq: Every day | ORAL | Status: DC
  Administered 2023-05-01 – 2023-05-06 (×6): 9 mg via ORAL
  Filled 2023-05-01 (×6): qty 3

## 2023-05-01 NOTE — ED Notes (Signed)
Pt has been cleaned up and changed. Pt resting in the bed at this time.

## 2023-05-01 NOTE — ED Triage Notes (Signed)
Per facility pt has been constipated and tried using the restroom. She had near syncopal episode on toilet and then started c/o sob. Facility states they could not get o2 reading or blood pressure reading on pt so they sent her here.

## 2023-05-01 NOTE — ED Provider Notes (Signed)
[place] EMERGENCY DEPARTMENT AT [facility] Provider Note  CSN: [id number] Arrival date & time: 05/01/23 1553  Chief Complaint(s) Near Syncope  HPI [name] is a 67 y.o. female with PMH bipolar disorder, drug abuse, T2DM, recurrent hospitalization for urosepsis secondary to ESBL E. coli who presents emergency department for evaluation of a syncopal episode and abdominal pain.  States that today, she was using the restroom and had a syncopal episode off of the toilet.  Staff at her skilled nursing facility were unable to obtain vital signs and called an ambulance.  Here in the emergency room, patient arrives tachycardic, borderline febrile with complaints of dysuria and increased frequency and suprapubic abdominal pain.  Denies associated nausea, vomiting, chest pain or other systemic symptoms.   Past Medical History Past Medical History:  Diagnosis Date   Allergy    Bipolar disorder (HCC)    Depression    Drug abuse (HCC)    history of, went to rehab 2015   GERD (gastroesophageal reflux disease)    Lung cancer (HCC)    lung ca dx 11/11- right upper lobe   Patient Active Problem List   Diagnosis Date Noted   UTI due to extended-spectrum beta lactamase (ESBL) producing Escherichia coli 02/18/2023   HTN (hypertension) 02/18/2023   Septic shock (HCC) 02/14/2023   Sepsis (HCC) 02/14/2023   Controlled type 2 diabetes mellitus with hyperglycemia (HCC) 02/14/2023   DVT (deep venous thrombosis) 09/08/22 09/08/2022   Acute left ankle pain 12/13/2021   UTI (urinary tract infection) 08/03/2021   Hypernatremia 07/30/2021   Acute urinary retention 07/30/2021   Hypokalemia 07/30/2021   Hypotension 07/29/2021   Ankle wound, left 07/29/2021   Severe sepsis (HCC) 07/28/2021   Dyslipidemia 07/28/2021   Acute metabolic encephalopathy 07/28/2021   MDD (major depressive disorder), recurrent severe, 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 Authorizing Provider  acetaminophen (TYLENOL) 325 MG tablet Take 650 mg by mouth every 4 (four) hours as needed for mild pain.    [provider]  ascorbic acid (VITAMIN C) 500 MG tablet Take 500 mg by mouth 2 (two) times daily.    [provider]  busPIRone (BUSPAR) 5 MG tablet Take 5 mg by mouth 2 (two) times daily. 01/14/23   [provider]  calcium carbonate (TUMS - DOSED IN MG ELEMENTAL CALCIUM) 500 MG chewable tablet Chew 1 tablet by mouth every 5 (five) hours as needed for indigestion or heartburn.    [provider]  cetirizine (ZYRTEC) 10 MG tablet Take 10 mg by mouth daily. For allergies    [provider]  ELIQUIS 5 MG TABS tablet Take 5 mg by mouth 2 (two) times daily. 01/18/23   [provider]  folic acid (FOLVITE) 1 MG tablet Take 1 mg by mouth daily.    [provider]  gabapentin (NEURONTIN) 100 MG capsule Take 300 mg by mouth 3 (three) times daily. 07/20/21   [provider]  hydrOXYzine (ATARAX) 25 MG tablet Take 1 tablet (25 mg total) by mouth every 6 (six) hours as needed for itching. 08/27/22   Jacalyn Lefevre, MD  insulin aspart (FIASP FLEXTOUCH) 100 UNIT/ML FlexTouch Pen Inject 0-10 Units into the skin 4 (four)  times daily -  before meals and at bedtime. Short acting insulin (Aspart) per sliding scale 0-10 ---:-- Insulin injection 0-10 Units 0-10 Units Subcutaneous, 3 times daily with meals CBG 70 - 120: 0 unit CBG 121 - 150: 0 unit  CBG 151 - 200: 1 unit CBG 201 - 250: 2 units CBG 251 - 300: 4 units CBG 301 - 350: 6 units  CBG 351 - 400: 8 units  CBG > 400: 10 units 02/16/23   Emokpae, Courage, MD  Insulin Glargine (BASAGLAR KWIKPEN) 100 UNIT/ML Inject 10 Units into the skin  daily. 02/22/23   Osvaldo Shipper, MD  Ketoconazole 2 % FOAM Apply 1 Application topically daily. To scalp, for seborrheic dermatitis. 01/12/23   [provider]  lactulose (CHRONULAC) 10 GM/15ML solution Take 30 mLs by mouth daily as needed for moderate constipation. 01/18/23   [provider]  lidocaine (LIDODERM) 5 % Place 1 patch onto the skin daily. Remove & Discard patch within 12 hours or as directed by MD; Apply to LOWER LEG    [provider]  lidocaine 4 % dressing Apply 1 Application topically every 12 (twelve) hours as needed (pain).    [provider]  lithium carbonate 300 MG capsule Take 600 mg by mouth daily. 11/24/22   [provider]  loperamide (IMODIUM A-D) 2 MG tablet Take 2 mg by mouth every 6 (six) hours as needed for diarrhea or loose stools.    [provider]  LORazepam (ATIVAN) 0.5 MG tablet Take 1 tablet (0.5 mg total) by mouth at bedtime as needed for anxiety. 02/22/23   Osvaldo Shipper, MD  Melatonin 10 MG TABS Take 10 mg by mouth at bedtime.    [provider]  metFORMIN (GLUCOPHAGE) 500 MG tablet Take 1 pill a day Patient taking differently: Take 500 mg by mouth daily with breakfast. Take 1 pill a day 12/17/22   Bethann Berkshire, MD  methimazole (TAPAZOLE) 5 MG tablet Take 1 tablet (5 mg total) by mouth 2 (two) times daily. 02/16/23   Shon Hale, MD  midodrine (PROAMATINE) 5 MG tablet Take 1 tablet (5 mg total) by mouth 3 (three) times daily with meals. 02/16/23   Shon Hale, MD  Multiple Vitamin (MULTIVITAMIN ADULT PO) Take 1 tablet by mouth daily.    [provider]  OLANZapine (ZYPREXA) 10 MG tablet Take 10 mg by mouth at bedtime. 01/31/23   [provider]  ondansetron (ZOFRAN) 4 MG tablet Take 4 mg by mouth every 8 (eight) hours as needed. 12/14/22   [provider]  oxyCODONE-acetaminophen (PERCOCET) 10-325 MG tablet Take 1 tablet by mouth every 4 (four) hours as needed.  02/22/23   Osvaldo Shipper, MD  potassium chloride 20 MEQ/15ML (10%) SOLN Take 10 mEq by mouth daily. 12/20/22   [provider]  pyrithione zinc (SELSUN BLUE ITCHY DRY SCALP) 1 % shampoo Apply topically daily as needed for itching. Patient taking differently: Apply 1 Application topically daily as needed for itching. 08/27/22   Jacalyn Lefevre, MD  saccharomyces boulardii (FLORASTOR) 250 MG capsule Take 250 mg by mouth daily.    [provider]  senna-docusate (SENOKOT-S) 8.6-50 MG tablet Take 2 tablets by mouth 2 (two) times daily. May hold for loose stools    [provider]  tamsulosin (FLOMAX) 0.4 MG CAPS capsule Take 0.4 mg by mouth daily.    [provider]  zolpidem (AMBIEN) 5 MG tablet Take 1 tablet (5 mg total) by mouth at bedtime  as needed for sleep. 02/22/23   Osvaldo Shipper, MD                                                                                                                                    Past Surgical History Past Surgical History:  Procedure Laterality Date   ABDOMINAL AORTOGRAM W/LOWER EXTREMITY Bilateral 01/10/2022   Procedure: ABDOMINAL AORTOGRAM W/LOWER EXTREMITY;  Surgeon: Nada Libman, MD;  Location: [facility];  Service: Cardiovascular;  Laterality: Bilateral;   ANKLE FRACTURE SURGERY Left    HARDWARE REMOVAL Left 12/13/2021   Procedure: LEFT ANKLE HARDWARE REMOVAL;  Surgeon: Marcene Corning, MD;  Location: [facility];  Service: Orthopedics;  Laterality: Left;   LUNG LOBECTOMY Right 2011   RUL removed for lung cancer   STYLOID PROCESS EXCISION Left 10/16/2014   Procedure: EXCISION LEFT STYLOID PROCESS;  Surgeon: Drema Halon, MD;  Location: [facility];  Service: ENT;  Laterality: Left;   TONSILLECTOMY Bilateral 10/16/2014   Procedure: BILATERAL TONSILLECTOMY;  Surgeon: Drema Halon, MD;  Location:  SURGERY CENTER;  Service: ENT;  Laterality: Bilateral;   TUBAL LIGATION  1984    Family History Family History  Problem Relation Age of Onset   Cancer Mother        breast cancer   Cancer Father        stomach cancer   Diabetes Father    Cancer Sister        breast cancer   Diabetes Brother    Diabetes Brother    Cancer Sister        breast cancer    Social History Social History   Tobacco Use   Smoking status: Former    Current packs/day: 1.00    Average packs/day: 1 pack/day for 40.0 years (40.0 ttl pk-yrs)    Types: Cigarettes   Smokeless tobacco: Never  Vaping Use   Vaping status: Former  Substance Use Topics   Alcohol use: Not Currently    Comment: hx alcoholism.  none since 2017   Drug use: Not Currently    Types: Benzodiazepines    Comment: hx of xanax abuse. last use 2015   Allergies Sulfa antibiotics  Review of Systems Review of Systems  Genitourinary:  Positive for dysuria and urgency.  Neurological:  Positive for syncope.    Physical Exam Vital Signs  I have reviewed the triage vital signs BP (!) 138/100   Pulse (!) 123   Temp 99.6 F (37.6 C) (Oral)   Resp 18   Ht 5\' 9"  (1.753 m)   Wt 102 kg   SpO2 95%   BMI 33.21 kg/m   Physical Exam Vitals and nursing note reviewed.  Constitutional:      General: She is not in acute distress.    Appearance: She is well-developed. She is ill-appearing.  HENT:     Head: Normocephalic and atraumatic.  Eyes:  Conjunctiva/sclera: Conjunctivae normal.  Cardiovascular:     Rate and Rhythm: Regular rhythm. Tachycardia present.     Heart sounds: No murmur heard. Pulmonary:     Effort: Pulmonary effort is normal. No respiratory distress.     Breath sounds: Normal breath sounds.  Abdominal:     Palpations: Abdomen is soft.     Tenderness: There is abdominal tenderness.  Musculoskeletal:        General: No swelling.     Cervical back: Neck supple.  Skin:    General: Skin is warm and dry.     Capillary Refill: Capillary refill takes less than 2 seconds.  Neurological:      Mental Status: She is alert.  Psychiatric:        Mood and Affect: Mood normal.     ED Results and Treatments Labs (all labs ordered are listed, but only abnormal results are displayed) Labs Reviewed  CULTURE, BLOOD (ROUTINE X 2)  CULTURE, BLOOD (ROUTINE X 2)  COMPREHENSIVE METABOLIC PANEL  CBC WITH DIFFERENTIAL/PLATELET  LACTIC ACID, PLASMA  LACTIC ACID, PLASMA  PROTIME-INR  APTT  URINALYSIS, W/ REFLEX TO CULTURE (INFECTION SUSPECTED)                                                                                                                          Radiology No results found.  Pertinent labs & imaging results that were available during my care of the patient were reviewed by me and considered in my medical decision making (see MDM for details).  Medications Ordered in ED Medications  oxyCODONE-acetaminophen (PERCOCET/ROXICET) 5-325 MG per tablet 2 tablet (2 tablets Oral Given 05/01/23 1643)                                                                                                                                     Procedures Procedures  (including critical care time)  Medical Decision Making / ED Course   This patient presents to the ED for concern of near syncope, abdominal pain, this involves an extensive number of treatment options, and is a complaint that carries with it a high risk of complications and morbidity.  The differential diagnosis includes UTI, bacteremia, diverticulitis, obstruction, pyelonephritis, cardiogenic near syncope, orthostatic near syncope, dehydration  MDM: Patient seen emergency room for evaluation of dysuria, increased frequency, near syncope and abdominal pain.  Physical exam with tenderness in the suprapubic  region as well as tachycardia and mild diaphoresis.  Laboratory evaluation with no significant leukocytosis, urinalysis is concerning for infection.  Given known history of ESBL UTIs patient started on meropenem.  Imaging  concerning for a large volume of stool in the rectum but no additional sources of sepsis identified.  Patient require hospital admission for ESBL UTI and tachycardia.   Additional history obtained:  -External records from outside source obtained and reviewed including: Chart review including previous notes, labs, imaging, consultation notes   Lab Tests: -I ordered, reviewed, and interpreted labs.   The pertinent results include:   Labs Reviewed  CULTURE, BLOOD (ROUTINE X 2)  CULTURE, BLOOD (ROUTINE X 2)  COMPREHENSIVE METABOLIC PANEL  CBC WITH DIFFERENTIAL/PLATELET  LACTIC ACID, PLASMA  LACTIC ACID, PLASMA  PROTIME-INR  APTT  URINALYSIS, W/ REFLEX TO CULTURE (INFECTION SUSPECTED)      EKG   EKG Interpretation Date/Time:  Tuesday May 01 2023 16:05:17 EST Ventricular Rate:  123 PR Interval:  140 QRS Duration:  89 QT Interval:  319 QTC Calculation: 457 R Axis:   46  Text Interpretation: Sinus tachycardia Low voltage, precordial leads Nonspecific T abnormalities, diffuse leads Confirmed by Pricilla Loveless [phone]) on 05/02/2023 8:01:44 AM         Imaging Studies ordered: I ordered imaging studies including chest x-ray, CT abdomen pelvis I independently visualized and interpreted imaging. I agree with the radiologist interpretation   Medicines ordered and prescription drug management: Meds ordered this encounter  Medications   oxyCODONE-acetaminophen (PERCOCET/ROXICET) 5-325 MG per tablet 2 tablet    Refill:  0    -I have reviewed the patients home medicines and have made adjustments as needed  Critical interventions none    Cardiac Monitoring: The patient was maintained on a cardiac monitor.  I personally viewed and interpreted the cardiac monitored which showed an underlying rhythm of: Sinus tachycardia  Social Determinants of Health:  Factors impacting patients care include: none   Reevaluation: After the interventions noted above, I reevaluated  the patient and found that they have :improved  Co morbidities that complicate the patient evaluation  Past Medical History:  Diagnosis Date   Allergy    Bipolar disorder (HCC)    Depression    Drug abuse (HCC)    history of, went to rehab 2015   GERD (gastroesophageal reflux disease)    Lung cancer (HCC)    lung ca dx 11/11- right upper lobe      Dispostion: I considered admission for this patient, and patient require hospital admission for ESBL UTI     Final Clinical Impression(s) / ED Diagnoses Final diagnoses:  None     @PCDICTATION @    Glendora Score, MD 05/02/23 1131

## 2023-05-01 NOTE — H&P (Signed)
History and Physical    BECKEY HEIFETZ ZOX:[id number] DOB: [birth year] DOA: 05/01/2023 PCP: Patient, [name] Per  Chief Complaint: Pain Historian: patient  HPI:  [name] is a 67 y.o. female with a PMH significant for frequent ESBL UTIs, HLD, severe bipolar 1 disorder, lung cancer, DJD, MDD, acute urinary retention, type 2 diabetes mellitus, DVT. At baseline, they live at a nursing facility and are dependent for other ADLs.  She is bedbound.  They presented from nursing facility to the ED on 05/01/2023 with reported episode of syncope while having a bowel movement.  Patient denies this episode.  Her only complaint today is pain in her right foot.  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  She requests pain medication oral and topical.  She also requests a trachea which. Discussed patient's laboratory results of having a urinary tract infection and she states that she has difficulty initiating her urine stream and has to strain but this is not new.  She endorses suprapubic pain.  Endorses history of ESBL UTIs. States that she has had chills and felt febrile today.  Denies nausea, vomiting, diarrhea.   In the ED, it was found that they had tachycardia to 120s, and otherwise stable vital signs.  Significant findings included: urinalysis:small hgb, large leukocytes, negative nitrite, >50 WBC, few bacteria. Unremarkable metabolic panel, and CBC. LA 2.6.  CT chest, abdomen, pelvis: 1. Moderate-large volume stool throughout the colon. Rectum is dilated with a large stool ball measuring 9 cm in diameter. Mild circumferential rectal wall thickening and mild fat stranding, suggestive of stercoral colitis. 2. No acute intrathoracic findings. 3. Tiny nonobstructing left renal stone. 4. A 2.6 cm low-attenuation nodule in the right thyroid lobe. Recommend thyroid US (ref: J Am Coll Radiol. 2015 Feb;12(2): 143-50). 5. Hepatomegaly. 6. Aortic atherosclerosis (ICD10-I70.0). Unremarkable chest xray.    They were initially treated with meropenem. Urine cultures pending.   Patient was admitted to medicine service for further workup and management of urosepsis as outlined in detail below.  Assessment/Plan Principal Problem:   Sepsis due to urinary tract infection (HCC)   Sepsis 2/2 UTI- meets criteria with tachycardia, tachypnea, elevated LA, urine source. Her temp is 99.6 but is visually sweating and endorsed sweating. H/o ESBL - continue meropenem  - follow blood and urine cultures - encourage PO hydration - repeat LA - continue home flomax for urinary retention and h/o nephroliths  - continue home midodrine  Constipation- as seen on CT abdomen - home lactulose and miralax ordered  Bipolar  MDD- - continue home medications  Type II DM - sSSI  Chronic pain- R foot. Appears to be at baseline - continue home pain regimen  H/o DVT - continue eliquis  Hyperthyroidism- thyroid nodule seen on CT - continue home methimazole  - TSH  Past Medical History:  Diagnosis Date   Allergy    Bipolar disorder (HCC)    Depression    Drug abuse (HCC)    history of, went to rehab 2015   GERD (gastroesophageal reflux disease)    Lung cancer (HCC)    lung ca dx 11/11- right upper lobe    Past Surgical History:  Procedure Laterality Date   ABDOMINAL AORTOGRAM W/LOWER EXTREMITY Bilateral 01/10/2022   Procedure: ABDOMINAL AORTOGRAM W/LOWER EXTREMITY;  Surgeon: Nada Libman, MD;  Location: [facility];  Service: Cardiovascular;  Laterality: Bilateral;   ANKLE FRACTURE SURGERY Left    HARDWARE REMOVAL Left 12/13/2021   Procedure: LEFT ANKLE  HARDWARE REMOVAL;  Surgeon: Marcene Corning, MD;  Location: [facility];  Service: Orthopedics;  Laterality: Left;   LUNG LOBECTOMY Right 2011   RUL removed for lung cancer   STYLOID PROCESS EXCISION Left 10/16/2014   Procedure: EXCISION LEFT STYLOID PROCESS;  Surgeon: Drema Halon, MD;  Location: [facility];  Service:  ENT;  Laterality: Left;   TONSILLECTOMY Bilateral 10/16/2014   Procedure: BILATERAL TONSILLECTOMY;  Surgeon: Drema Halon, MD;  Location: [facility];  Service: ENT;  Laterality: Bilateral;   TUBAL LIGATION  1984     reports that she has quit smoking. Her smoking use included cigarettes. She has a 40 pack-year smoking history. She has never used smokeless tobacco. She reports that she does not currently use alcohol. She reports that she does not currently use drugs after having used the following drugs: Benzodiazepines.  Allergies  Allergen Reactions   Sulfa Antibiotics Swelling and Rash    Neck swelling    Family History  Problem Relation Age of Onset   Cancer Mother        breast cancer   Cancer Father        stomach cancer   Diabetes Father    Cancer Sister        breast cancer   Diabetes Brother    Diabetes Brother    Cancer Sister        breast cancer    Prior to Admission medications   Medication Sig Start Date End Date Taking? Authorizing Provider  acetaminophen (TYLENOL) 325 MG tablet Take 650 mg by mouth every 4 (four) hours as needed for mild pain.    [provider]  ascorbic acid (VITAMIN C) 500 MG tablet Take 500 mg by mouth 2 (two) times daily.    [provider]  busPIRone (BUSPAR) 5 MG tablet Take 5 mg by mouth 2 (two) times daily. 01/14/23   [provider]  calcium carbonate (TUMS - DOSED IN MG ELEMENTAL CALCIUM) 500 MG chewable tablet Chew 1 tablet by mouth every 5 (five) hours as needed for indigestion or heartburn.    [provider]  cetirizine (ZYRTEC) 10 MG tablet Take 10 mg by mouth daily. For allergies    [provider]  ELIQUIS 5 MG TABS tablet Take 5 mg by mouth 2 (two) times daily. 01/18/23   [provider]  folic acid (FOLVITE) 1 MG tablet Take 1 mg by mouth daily.    [provider]  gabapentin (NEURONTIN) 100 MG capsule Take 300 mg by mouth 3 (three) times daily.  07/20/21   [provider]  hydrOXYzine (ATARAX) 25 MG tablet Take 1 tablet (25 mg total) by mouth every 6 (six) hours as needed for itching. 08/27/22   Jacalyn Lefevre, MD  insulin aspart (FIASP FLEXTOUCH) 100 UNIT/ML FlexTouch Pen Inject 0-10 Units into the skin 4 (four) times daily -  before meals and at bedtime. Short acting insulin (Aspart) per sliding scale 0-10 ---:-- Insulin injection 0-10 Units 0-10 Units Subcutaneous, 3 times daily with meals CBG 70 - 120: 0 unit CBG 121 - 150: 0 unit  CBG 151 - 200: 1 unit CBG 201 - 250: 2 units CBG 251 - 300: 4 units CBG 301 - 350: 6 units  CBG 351 - 400: 8 units  CBG > 400: 10 units 02/16/23   Emokpae, Courage, MD  Insulin Glargine (BASAGLAR KWIKPEN) 100 UNIT/ML Inject 10 Units into the skin daily. 02/22/23   Osvaldo Shipper,  MD  Ketoconazole 2 % FOAM Apply 1 Application topically daily. To scalp, for seborrheic dermatitis. 01/12/23   [provider]  lactulose (CHRONULAC) 10 GM/15ML solution Take 30 mLs by mouth daily as needed for moderate constipation. 01/18/23   [provider]  lidocaine (LIDODERM) 5 % Place 1 patch onto the skin daily. Remove & Discard patch within 12 hours or as directed by MD; Apply to LOWER LEG    [provider]  lidocaine 4 % dressing Apply 1 Application topically every 12 (twelve) hours as needed (pain).    [provider]  lithium carbonate 300 MG capsule Take 600 mg by mouth daily. 11/24/22   [provider]  loperamide (IMODIUM A-D) 2 MG tablet Take 2 mg by mouth every 6 (six) hours as needed for diarrhea or loose stools.    [provider]  LORazepam (ATIVAN) 0.5 MG tablet Take 1 tablet (0.5 mg total) by mouth at bedtime as needed for anxiety. 02/22/23   Osvaldo Shipper, MD  Melatonin 10 MG TABS Take 10 mg by mouth at bedtime.    [provider]  metFORMIN (GLUCOPHAGE) 500 MG tablet Take 1 pill a day Patient taking differently: Take 500 mg by mouth daily with  breakfast. Take 1 pill a day 12/17/22   Bethann Berkshire, MD  methimazole (TAPAZOLE) 5 MG tablet Take 1 tablet (5 mg total) by mouth 2 (two) times daily. 02/16/23   Shon Hale, MD  midodrine (PROAMATINE) 5 MG tablet Take 1 tablet (5 mg total) by mouth 3 (three) times daily with meals. 02/16/23   Shon Hale, MD  Multiple Vitamin (MULTIVITAMIN ADULT PO) Take 1 tablet by mouth daily.    [provider]  OLANZapine (ZYPREXA) 10 MG tablet Take 10 mg by mouth at bedtime. 01/31/23   [provider]  ondansetron (ZOFRAN) 4 MG tablet Take 4 mg by mouth every 8 (eight) hours as needed. 12/14/22   [provider]  oxyCODONE-acetaminophen (PERCOCET) 10-325 MG tablet Take 1 tablet by mouth every 4 (four) hours as needed. 02/22/23   Osvaldo Shipper, MD  potassium chloride 20 MEQ/15ML (10%) SOLN Take 10 mEq by mouth daily. 12/20/22   [provider]  pyrithione zinc (SELSUN BLUE ITCHY DRY SCALP) 1 % shampoo Apply topically daily as needed for itching. Patient taking differently: Apply 1 Application topically daily as needed for itching. 08/27/22   Jacalyn Lefevre, MD  saccharomyces boulardii (FLORASTOR) 250 MG capsule Take 250 mg by mouth daily.    [provider]  senna-docusate (SENOKOT-S) 8.6-50 MG tablet Take 2 tablets by mouth 2 (two) times daily. May hold for loose stools    [provider]  tamsulosin (FLOMAX) 0.4 MG CAPS capsule Take 0.4 mg by mouth daily.    [provider]  zolpidem (AMBIEN) 5 MG tablet Take 1 tablet (5 mg total) by mouth at bedtime as needed for sleep. 02/22/23   Osvaldo Shipper, MD   I have personally, briefly reviewed patient's prior medical records in [facility]  Objective: Blood pressure 118/75, pulse (!) 114, temperature 98.1 F (36.7 C), temperature source Oral, resp. rate 15, height 5\' 9"  (1.753 m), weight 102 kg, SpO2 94%.   Constitutional: NAD, calm, comfortable. Diaphoretic.  HEENT: lids and conjunctivae  normal. MMM. Posterior pharynx clear of any exudate or lesions. Poor dentition.  Respiratory: CTAB, no wheezing, no crackles. Normal respiratory effort. No accessory muscle use.  Cardiovascular: RRR, no murmurs / rubs / gallops. Abdomen: soft, tender to  palpation of suprapubic area Musculoskeletal: deformity of right foot Skin: dry, intact, normal color, normal temperature on exposed skin Neurologic: Alert and oriented x 3. Normal speech. Grossly non-focal exam. PERRL Psychiatric: Normal mood. Congruent affect.  Labs on Admission: I have personally reviewed admission labs and imaging studies  CBC    Component Value Date/Time   WBC 7.0 05/01/2023 1750   RBC 4.88 05/01/2023 1750   HGB 14.7 05/01/2023 1750   HGB 14.1 11/13/2014 0902   HCT 45.6 05/01/2023 1750   HCT 41.1 11/13/2014 0902   PLT 195 05/01/2023 1750   PLT 196 11/13/2014 0902   MCV 93.4 05/01/2023 1750   MCV 93.6 11/13/2014 0902   MCH 30.1 05/01/2023 1750   MCHC 32.2 05/01/2023 1750   RDW 13.9 05/01/2023 1750   RDW 13.5 11/13/2014 0902   LYMPHSABS 0.3 (L) 05/01/2023 1750   LYMPHSABS 1.3 11/13/2014 0902   MONOABS 0.1 05/01/2023 1750   MONOABS 0.4 11/13/2014 0902   EOSABS 0.0 05/01/2023 1750   EOSABS 0.1 11/13/2014 0902   BASOSABS 0.0 05/01/2023 1750   BASOSABS 0.0 11/13/2014 0902   CMP     Component Value Date/Time   NA 138 05/01/2023 1750   NA 143 11/13/2014 0902   K 3.8 05/01/2023 1750   K 3.3 (L) 11/13/2014 0902   CL 103 05/01/2023 1750   CL 101 09/01/2011 0909   CO2 23 05/01/2023 1750   CO2 27 11/13/2014 0902   GLUCOSE 184 (H) 05/01/2023 1750   GLUCOSE 124 11/13/2014 0902   GLUCOSE 101 09/01/2011 0909   BUN 15 05/01/2023 1750   BUN 10.0 11/13/2014 0902   CREATININE 0.54 05/01/2023 1750   CREATININE 0.7 11/13/2014 0902   CALCIUM 9.7 05/01/2023 1750   CALCIUM 9.2 11/13/2014 0902   PROT 7.5 05/01/2023 1750   PROT 6.2 (L) 11/13/2014 0902   ALBUMIN 3.7 05/01/2023 1750   ALBUMIN 3.4 (L) 11/13/2014 0902    AST 18 05/01/2023 1750   AST 13 11/13/2014 0902   ALT 28 05/01/2023 1750   ALT 18 11/13/2014 0902   ALKPHOS 104 05/01/2023 1750   ALKPHOS 90 11/13/2014 0902   BILITOT 0.7 05/01/2023 1750   BILITOT 0.32 11/13/2014 0902   GFRNONAA >60 05/01/2023 1750   GFRAA >60 02/12/2018 1111    Radiological Exams on Admission: DG Chest Port 1 View Result Date: 05/01/2023 CLINICAL DATA:  Sepsis.  Constipation. EXAM: PORTABLE CHEST 1 VIEW COMPARISON:  X-ray 02/18/2023. FINDINGS: Kyphotic x-ray obscures the apices. Film is also rotated. Stable enlarged cardiopericardial silhouette with tortuous ectatic aorta. No pneumothorax or effusion. No edema. There is some linear opacity left lung base likely scar or atelectasis. Overlapping cardiac leads. Film is under penetrated. IMPRESSION: Limited radiograph.  Stable enlarged cardiopericardial silhouette. Left basilar scar or atelectasis.  No consolidation Electronically Signed   By: Karen Kays M.D.   On: 05/01/2023 18:11    EKG: Independently reviewed.sinus tachycardia  DVT prophylaxis:  apixaban (ELIQUIS) tablet 5 mg   Code Status: DNR, patient states she is ready to go home to [place] and does not want to be resuscitated   Family Communication: none at bedside   Disposition Plan: admit to med-surg  Consults called: none    Leeroy Bock, DO [facility]  05/01/2023, 8:35 PM    To contact the appropriate [facility] Attending or Consulting provider: Check [email or web address] for coverage from 7pm-7am

## 2023-05-02 ENCOUNTER — Inpatient Hospital Stay (HOSPITAL_COMMUNITY): Payer: Medicare Other

## 2023-05-02 DIAGNOSIS — K5900 Constipation, unspecified: Secondary | ICD-10-CM | POA: Diagnosis not present

## 2023-05-02 DIAGNOSIS — K5289 Other specified noninfective gastroenteritis and colitis: Secondary | ICD-10-CM

## 2023-05-02 DIAGNOSIS — N390 Urinary tract infection, site not specified: Secondary | ICD-10-CM | POA: Diagnosis not present

## 2023-05-02 DIAGNOSIS — A419 Sepsis, unspecified organism: Secondary | ICD-10-CM | POA: Diagnosis not present

## 2023-05-02 LAB — BLOOD CULTURE ID PANEL (REFLEXED) - BCID2

## 2023-05-02 LAB — BASIC METABOLIC PANEL
Anion gap: 8 (ref 5–15)
BUN: 13 mg/dL (ref 8–23)
CO2: 26 mmol/L (ref 22–32)
Calcium: 9.1 mg/dL (ref 8.9–10.3)
Chloride: 103 mmol/L (ref 98–111)
Creatinine, Ser: 0.46 mg/dL (ref 0.44–1.00)
GFR, Estimated: >60 mL/min (ref >60)
Glucose, Bld: 144 mg/dL — ABNORMAL HIGH (ref 70–99)
Potassium: 3.9 mmol/L (ref 3.5–5.1)
Sodium: 137 mmol/L (ref 135–145)

## 2023-05-02 LAB — MRSA NEXT GEN BY PCR, NASAL: MRSA by PCR Next Gen: DETECTED — AB

## 2023-05-02 LAB — GLUCOSE, CAPILLARY
Glucose-Capillary: 151 mg/dL — ABNORMAL HIGH (ref 70–99)
Glucose-Capillary: 137 mg/dL — ABNORMAL HIGH (ref 70–99)
Glucose-Capillary: 174 mg/dL — ABNORMAL HIGH (ref 70–99)

## 2023-05-02 LAB — CBC
HCT: 41 % (ref 36.0–46.0)
Hemoglobin: 13 g/dL (ref 12.0–15.0)
MCH: 29.5 pg (ref 26.0–34.0)
MCHC: 31.7 g/dL (ref 30.0–36.0)
MCV: 93.2 fL (ref 80.0–100.0)
Platelets: 197 10*3/uL (ref 150–400)
RBC: 4.4 MIL/uL (ref 3.87–5.11)
RDW: 14 % (ref 11.5–15.5)
WBC: 12 10*3/uL — ABNORMAL HIGH (ref 4.0–10.5)
nRBC: 0 % (ref 0.0–0.2)

## 2023-05-02 MED ORDER — METHIMAZOLE 5 MG PO TABS
5.0000 mg | ORAL_TABLET | Freq: Two times a day (BID) | ORAL | Status: DC
  Administered 2023-05-02 – 2023-05-07 (×11): 5 mg via ORAL
  Filled 2023-05-02 (×12): qty 1

## 2023-05-02 MED ORDER — MUPIROCIN 2 % EX OINT
1.0000 | TOPICAL_OINTMENT | Freq: Two times a day (BID) | CUTANEOUS | Status: AC
  Administered 2023-05-02 – 2023-05-07 (×10): 1 via NASAL
  Filled 2023-05-02 (×2): qty 22

## 2023-05-02 MED ORDER — GABAPENTIN 300 MG PO CAPS
300.0000 mg | ORAL_CAPSULE | Freq: Three times a day (TID) | ORAL | Status: DC
  Administered 2023-05-02 – 2023-05-07 (×17): 300 mg via ORAL
  Filled 2023-05-02 (×18): qty 1

## 2023-05-02 MED ORDER — QUETIAPINE FUMARATE 25 MG PO TABS
25.0000 mg | ORAL_TABLET | Freq: Every day | ORAL | Status: DC
  Administered 2023-05-03 – 2023-05-07 (×5): 25 mg via ORAL
  Filled 2023-05-02 (×6): qty 1

## 2023-05-02 MED ORDER — QUETIAPINE FUMARATE 100 MG PO TABS
100.0000 mg | ORAL_TABLET | Freq: Every day | ORAL | Status: DC
  Administered 2023-05-02 – 2023-05-06 (×6): 100 mg via ORAL
  Filled 2023-05-02 (×6): qty 1

## 2023-05-02 MED ORDER — DICLOFENAC SODIUM 1 % EX GEL
2.0000 g | Freq: Four times a day (QID) | CUTANEOUS | Status: DC
Start: 2023-05-02 — End: 2023-05-07
  Administered 2023-05-02 – 2023-05-07 (×23): 2 g via TOPICAL
  Filled 2023-05-02 (×2): qty 100

## 2023-05-02 MED ORDER — MILK AND MOLASSES ENEMA
1.0000 | Freq: Once | RECTAL | Status: AC
  Administered 2023-05-02: 240 mL via RECTAL

## 2023-05-02 MED ORDER — CHLORHEXIDINE GLUCONATE CLOTH 2 % EX PADS
6.0000 | MEDICATED_PAD | Freq: Every day | CUTANEOUS | Status: AC
  Administered 2023-05-03 – 2023-05-07 (×5): 6 via TOPICAL

## 2023-05-02 MED ORDER — LITHIUM CARBONATE 150 MG PO CAPS
300.0000 mg | ORAL_CAPSULE | Freq: Every day | ORAL | Status: DC
  Administered 2023-05-02 – 2023-05-06 (×6): 300 mg via ORAL
  Filled 2023-05-02 (×6): qty 2

## 2023-05-02 MED ORDER — INSULIN GLARGINE-YFGN 100 UNIT/ML ~~LOC~~ SOLN
10.0000 [IU] | Freq: Every day | SUBCUTANEOUS | Status: DC
  Administered 2023-05-02 – 2023-05-07 (×6): 10 [IU] via SUBCUTANEOUS
  Filled 2023-05-02 (×8): qty 0.1

## 2023-05-02 MED ORDER — POLYETHYLENE GLYCOL 3350 17 G PO PACK
17.0000 g | PACK | Freq: Two times a day (BID) | ORAL | Status: DC
  Administered 2023-05-02 – 2023-05-03 (×2): 17 g via ORAL
  Filled 2023-05-02 (×3): qty 1

## 2023-05-02 MED ORDER — APIXABAN 5 MG PO TABS
5.0000 mg | ORAL_TABLET | Freq: Two times a day (BID) | ORAL | Status: DC
  Administered 2023-05-02 – 2023-05-07 (×11): 5 mg via ORAL
  Filled 2023-05-02 (×12): qty 1

## 2023-05-02 NOTE — Progress Notes (Signed)
Date and time results received: 05/02/23 0858 (use smartphrase ".now" to insert current time)  Test: Blood cultures Critical Value: Gram negative rods  Name of Provider Notified: Laural Benes

## 2023-05-02 NOTE — Progress Notes (Signed)
PROGRESS NOTE   [name]  BJY:[id number] DOB: [birth year] DOA: 05/01/2023 PCP: Patient, [name] Per   Chief Complaint  Patient presents with   Near Syncope   Level of care: Med-Surg  Brief Admission History:  67 y.o. female with a PMH significant for frequent ESBL UTIs, HLD, severe bipolar 1 disorder, lung cancer, DJD, MDD, acute urinary retention, type 2 diabetes mellitus, DVT.  At baseline, they live at a nursing facility and are dependent for other ADLs.  She is bedbound.   They presented from nursing facility to the ED on 05/01/2023 with reported episode of syncope while having a bowel movement.  Patient denies this episode.  Her only complaint today is pain in her right foot.  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  She requests pain medication oral and topical.  She also requests a trachea which.  Discussed patient's laboratory results of having a urinary tract infection and she states that she has difficulty initiating her urine stream and has to strain but this is not new.  She endorses suprapubic pain.  Endorses history of ESBL UTIs.  States that she has had chills and felt febrile today.  Denies nausea, vomiting, diarrhea.     They were initially treated with meropenem. Urine cultures pending.    Patient was admitted to medicine service for further workup and management of urosepsis as outlined in detail below.   Assessment and Plan:  Sepsis secondary to ESBL E coli UTI  - recurrent infection  - continue meropenem  - blood cultures positive for gram negative rods - follow up C&S data - urinary retention most likely from severe constipation (opioid induced)  Obstipation  Stercoral colitis  - continue miralax BID, plus nightly senna - molasses enema ordered 12/18 - intensify therapy if needed - last BM was at least 1 week ago per patient  - if no success may need GI consultation   Urinary Retention  - continue flomax - treating severe constipation   Chronic  pain / opioid dependence  - she is back on home pain management regimen  Type 2 DM  - continue SSI coverage CBG (last 3)  Recent Labs    05/02/23 0741 05/02/23 1139  GLUCAP 151* 137*   History of DVT  - continue apixaban   Hyperthyroidism  - home methimazole resumed  Right thyroid nodule  - US thyroid ordered  Bipolar and MDD - resumed home medication    DVT prophylaxis: apixaban Code Status: DNR  Family Communication:  Disposition: anticipate return to LTC   Consultants:   Procedures:   Antimicrobials:  Meropenem 12/17>>   Subjective: Pt reports having abdominal pain and distension and difficulty defecating  Objective: Vitals:   05/02/23 0138 05/02/23 0450 05/02/23 0455 05/02/23 0948  BP: (!) 120/51 102/70  108/68  Pulse: 89 80  91  Resp: 19 19  18   Temp: 98.3 F (36.8 C) 98.2 F (36.8 C)    TempSrc:      SpO2: 97% (!) 78% 95% 95%  Weight:      Height:        Intake/Output Summary (Last 24 hours) at 05/02/2023 1310 Last data filed at 05/02/2023 0900 Gross per 24 hour  Intake 480 ml  Output 600 ml  Net -120 ml   Filed Weights   05/01/23 1555 05/01/23 2131  Weight: 102 kg 104.2 kg   Examination:  General exam: Appears calm and comfortable  Respiratory system: Clear to auscultation. Respiratory effort normal.  Cardiovascular system: normal S1 & S2 heard. No JVD, murmurs, rubs, gallops or clicks. No pedal edema. Gastrointestinal system: Abdomen is distended, tense and tender. No organomegaly or masses felt. Normal bowel sounds heard. Central nervous system: Alert and oriented. No focal neurological deficits. Extremities: Symmetric 5 x 5 power. Skin: No rashes, lesions or ulcers. Psychiatry: Judgement and insight appear normal. Mood & affect appropriate.   Data Reviewed: I have personally reviewed following labs and imaging studies  CBC: Recent Labs  Lab 05/01/23 1750 05/02/23 0435  WBC 7.0 12.0*  NEUTROABS 6.5  --   HGB 14.7 13.0  HCT  45.6 41.0  MCV 93.4 93.2  PLT 195 197    Basic Metabolic Panel: Recent Labs  Lab 05/01/23 1750 05/02/23 0435  NA 138 137  K 3.8 3.9  CL 103 103  CO2 23 26  GLUCOSE 184* 144*  BUN 15 13  CREATININE 0.54 0.46  CALCIUM 9.7 9.1    CBG: Recent Labs  Lab 05/02/23 0741 05/02/23 1139  GLUCAP 151* 137*    Recent Results (from the past 240 hours)  Blood Culture (routine x 2)     Status: None (Preliminary result)   Collection Time: 05/01/23  5:50 PM   Specimen: BLOOD RIGHT ARM  Result Value Ref Range Status   Specimen Description   Final    BLOOD RIGHT ARM Performed at [facility], [address] [place], [place] [address]    Special Requests   Final    BOTTLES DRAWN AEROBIC AND ANAEROBIC Blood Culture adequate volume Performed at [facility], [address] [place], [place] [address]    Culture  Setup Time   Final    GRAM NEGATIVE RODS IN BOTH AEROBIC AND ANAEROBIC BOTTLES CRITICAL RESULT CALLED TO, READ BACK BY AND VERIFIED WITH: [facility] AT 8546 05/02/2023 BY T KENNEDY Organism ID to follow Performed at [facility], [address], [place], [place] [address]    Culture GRAM NEGATIVE RODS  Final   Report Status PENDING  Incomplete  Blood Culture (routine x 2)     Status: None (Preliminary result)   Collection Time: 05/01/23  5:50 PM   Specimen: BLOOD  Result Value Ref Range Status   Specimen Description BLOOD BLOOD LEFT ARM  Final   Special Requests   Final    BOTTLES DRAWN AEROBIC AND ANAEROBIC Blood Culture adequate volume   Culture   Final    NO GROWTH < 24 HOURS Performed at [facility], [address] [place], [place] [address]    Report Status PENDING  Incomplete  Blood Culture ID Panel (Reflexed)     Status: Abnormal   Collection Time: 05/01/23  5:50 PM  Result Value Ref Range Status   Enterococcus faecalis NOT DETECTED NOT DETECTED Final   Enterococcus Faecium NOT DETECTED NOT DETECTED Final   Listeria monocytogenes NOT  DETECTED NOT DETECTED Final   Staphylococcus species NOT DETECTED NOT DETECTED Final   Staphylococcus aureus (BCID) NOT DETECTED NOT DETECTED Final   Staphylococcus epidermidis NOT DETECTED NOT DETECTED Final   Staphylococcus lugdunensis NOT DETECTED NOT DETECTED Final   Streptococcus species NOT DETECTED NOT DETECTED Final   Streptococcus agalactiae NOT DETECTED NOT DETECTED Final   Streptococcus pneumoniae NOT DETECTED NOT DETECTED Final   Streptococcus pyogenes NOT DETECTED NOT DETECTED Final   A.calcoaceticus-baumannii NOT DETECTED NOT DETECTED Final   Bacteroides fragilis NOT DETECTED NOT DETECTED Final   Enterobacterales DETECTED (A) NOT DETECTED Final  Comment: Enterobacterales represent a large order of gram negative bacteria, not a single organism. CRITICAL RESULT CALLED TO, READ BACK BY AND VERIFIED WITH: F. Andrey Campanile PHARMD, AT 1255 05/02/23 D. VANHOOK    Enterobacter cloacae complex NOT DETECTED NOT DETECTED Final   Escherichia coli DETECTED (A) NOT DETECTED Final    Comment: CRITICAL RESULT CALLED TO, READ BACK BY AND VERIFIED WITH: F. Andrey Campanile PHARMD, AT 1255 05/02/23 D. VANHOOK    Klebsiella aerogenes NOT DETECTED NOT DETECTED Final   Klebsiella oxytoca NOT DETECTED NOT DETECTED Final   Klebsiella pneumoniae NOT DETECTED NOT DETECTED Final   Proteus species NOT DETECTED NOT DETECTED Final   Salmonella species NOT DETECTED NOT DETECTED Final   Serratia marcescens NOT DETECTED NOT DETECTED Final   Haemophilus influenzae NOT DETECTED NOT DETECTED Final   Neisseria meningitidis NOT DETECTED NOT DETECTED Final   Pseudomonas aeruginosa NOT DETECTED NOT DETECTED Final   Stenotrophomonas maltophilia NOT DETECTED NOT DETECTED Final   Candida albicans NOT DETECTED NOT DETECTED Final   Candida auris NOT DETECTED NOT DETECTED Final   Candida glabrata NOT DETECTED NOT DETECTED Final   Candida krusei NOT DETECTED NOT DETECTED Final   Candida parapsilosis NOT DETECTED NOT DETECTED  Final   Candida tropicalis NOT DETECTED NOT DETECTED Final   Cryptococcus neoformans/gattii NOT DETECTED NOT DETECTED Final   CTX-M ESBL DETECTED (A) NOT DETECTED Final    Comment: CRITICAL RESULT CALLED TO, READ BACK BY AND VERIFIED WITH: FAndrey Campanile PHARMD, AT 1255 05/02/23 D. VANHOOK (NOTE) Extended spectrum beta-lactamase detected. Recommend a carbapenem as initial therapy.      Carbapenem resistance IMP NOT DETECTED NOT DETECTED Final   Carbapenem resistance KPC NOT DETECTED NOT DETECTED Final   Carbapenem resistance NDM NOT DETECTED NOT DETECTED Final   Carbapenem resist OXA 48 LIKE NOT DETECTED NOT DETECTED Final   Carbapenem resistance VIM NOT DETECTED NOT DETECTED Final    Comment: Performed at [facility], [address] [place], [place] [address]  MRSA Next Gen by PCR, Nasal     Status: Abnormal   Collection Time: 05/01/23  9:49 PM   Specimen: Nasal Mucosa; Nasal Swab  Result Value Ref Range Status   MRSA by PCR Next Gen DETECTED (A) NOT DETECTED Final    Comment: CRITICAL RESULT CALLED TO, READ BACK BY AND VERIFIED WITH: J. HARTFORD AT 0111 ON 12.18.24 BY ADGER J         The GeneXpert MRSA Assay (FDA approved for NASAL specimens only), is one component of a comprehensive MRSA colonization surveillance program. It is not intended to diagnose MRSA infection nor to guide or monitor treatment for MRSA infections. Performed at [facility], [address], [place], [place] [address]      Radiology Studies: CT CHEST ABDOMEN PELVIS W CONTRAST Result Date: 05/01/2023 CLINICAL DATA:  Sepsis EXAM: CT CHEST, ABDOMEN, AND PELVIS WITH CONTRAST TECHNIQUE: Multidetector CT imaging of the chest, abdomen and pelvis was performed following the standard protocol during bolus administration of intravenous contrast. RADIATION DOSE REDUCTION: This exam was performed according to the departmental dose-optimization program which includes automated exposure control, adjustment of  the mA and/or kV according to patient size and/or use of iterative reconstruction technique. CONTRAST:  OMNIPAQUE IOHEXOL 300 MG/ML  SOLN COMPARISON:  01/24/2023, 11/13/2014, 09/05/2013 FINDINGS: CT CHEST FINDINGS Cardiovascular: Heart size is upper limits of normal. No pericardial effusion. Thoracic aorta is nonaneurysmal. Scattered aortic atherosclerosis. Central pulmonary vasculature is within normal limits. Mediastinum/Nodes: No axillary, mediastinal,  or hilar lymphadenopathy. 2.6 cm low-attenuation nodule in the right thyroid lobe. Trachea and esophagus demonstrate no significant abnormality. Lungs/Pleura: Postsurgical changes within the right upper lobe. Minor left basilar atelectasis. Lungs are otherwise clear. No pleural effusion or pneumothorax. Musculoskeletal: No chest wall mass or suspicious bone lesions identified. Exaggerated thoracic kyphosis. CT ABDOMEN PELVIS FINDINGS Hepatobiliary: Liver measures 22 cm in length. No focal liver abnormality is seen. No gallstones, gallbladder wall thickening, or biliary dilatation. Pancreas: Unremarkable. No pancreatic ductal dilatation or surrounding inflammatory changes. Spleen: Normal in size without focal abnormality. Adrenals/Urinary Tract: Unremarkable adrenal glands. Longstanding stable hyperattenuating lesion along the medial aspect of the left kidney measuring 2.0 cm which requires no dedicated follow-up imaging. Tiny nonobstructing left renal stone. Right kidney is unremarkable. No hydronephrosis. Urinary bladder within normal limits for the degree of distension. Stomach/Bowel: Moderate-large volume stool throughout the colon. Rectum is dilated with a large stool ball measuring 9 cm in diameter. Mild circumferential rectal wall thickening and mild fat stranding. Tiny hiatal hernia. No dilated loops of small bowel to suggest obstruction. Vascular/Lymphatic: Aortic atherosclerosis. No enlarged abdominal or pelvic lymph nodes. Reproductive: Atrophic  uterus.  No adnexal masses. Other: No free fluid. No abdominopelvic fluid collection. No pneumoperitoneum. No abdominal wall hernia. Musculoskeletal: Incidentally noted 7 cm lipoma anterior to the left hip. No acute osseous abnormality. IMPRESSION: 1. Moderate-large volume stool throughout the colon. Rectum is dilated with a large stool ball measuring 9 cm in diameter. Mild circumferential rectal wall thickening and mild fat stranding, suggestive of stercoral colitis. 2. No acute intrathoracic findings. 3. Tiny nonobstructing left renal stone. 4. A 2.6 cm low-attenuation nodule in the right thyroid lobe. Recommend thyroid US (ref: J Am Coll Radiol. 2015 Feb;12(2): 143-50). 5. Hepatomegaly. 6. Aortic atherosclerosis (ICD10-I70.0). Electronically Signed   By: Duanne Guess D.O.   On: 05/01/2023 20:54   DG Chest Port 1 View Result Date: 05/01/2023 CLINICAL DATA:  Sepsis.  Constipation. EXAM: PORTABLE CHEST 1 VIEW COMPARISON:  X-ray 02/18/2023. FINDINGS: Kyphotic x-ray obscures the apices. Film is also rotated. Stable enlarged cardiopericardial silhouette with tortuous ectatic aorta. No pneumothorax or effusion. No edema. There is some linear opacity left lung base likely scar or atelectasis. Overlapping cardiac leads. Film is under penetrated. IMPRESSION: Limited radiograph.  Stable enlarged cardiopericardial silhouette. Left basilar scar or atelectasis.  No consolidation Electronically Signed   By: Karen Kays M.D.   On: 05/01/2023 18:11    Scheduled Meds:  apixaban  5 mg Oral BID   diclofenac Sodium  2 g Topical QID   folic acid  1 mg Oral Daily   gabapentin  300 mg Oral TID   insulin aspart  0-9 Units Subcutaneous TID WC   insulin glargine-yfgn  10 Units Subcutaneous Daily   lidocaine  1 patch Transdermal Q24H   lithium carbonate  300 mg Oral QHS   lithium carbonate  600 mg Oral Daily   loratadine  10 mg Oral Daily   melatonin  9 mg Oral QHS   methimazole  5 mg Oral BID   polyethylene glycol   17 g Oral BID   QUEtiapine  100 mg Oral QHS   QUEtiapine  25 mg Oral Daily   saccharomyces boulardii  250 mg Oral Daily   tamsulosin  0.4 mg Oral Daily   Continuous Infusions:  meropenem (MERREM) IV 1 g (05/02/23 0525)     LOS: 1 day   Time spent: 56 mins  Samreet Edenfield Laural Benes, MD How to contact the [facility] Attending  or Consulting provider 7A - 7P or covering provider during after hours 7P -7A, for this patient?  Check the care team in [facility] and look for a) attending/consulting TRH provider listed and b) the [facility] team listed Log into [email or web address] to find provider on call.  Locate the [facility] provider you are looking for under [facility] and page to a number that you can be directly reached. If you still have difficulty reaching the provider, please page the [facility] (Director on Call) for the Hospitalists listed on amion for assistance.  05/02/2023, 1:10 PM

## 2023-05-02 NOTE — TOC Initial Note (Signed)
Transition of Care [facility]) - Initial/Assessment Note    Patient Details  Name: [name] MRN: [id number] Date of Birth: 30-Oct-1955  [facility] CM/SW Contact:    Karn Cassis, LCSW Phone Number: 05/02/2023, 8:07 AM  Clinical Narrative:  Pt admitted due to sepsis secondary to UTI. Pt is long term resident at [facility]. Pt's son reports she has been at [facility] for almost 2 years. Plan is for return to [facility] at d/c. Debbie at [facility] confirms. TOC will follow.                                 Expected Discharge Plan: Long Term Nursing Home Barriers to Discharge: Continued Medical Work up   Patient Goals and CMS Choice Patient states their goals for this hospitalization and ongoing recovery are:: return to SNF   Choice offered to / list presented to : Adult Children [place] ownership interest in [facility].provided to::  (n/a)    Expected Discharge Plan and Services In-house Referral: Clinical Social Work   Post Acute Care Choice: Skilled Nursing Facility Living arrangements for the past 2 months: Skilled Nursing Facility                                      Prior Living Arrangements/Services Living arrangements for the past 2 months: Skilled Nursing Facility Lives with:: Facility Resident Patient language and need for interpreter reviewed:: Yes          Care giver support system in place?: Yes (comment)   Criminal Activity/Legal Involvement Pertinent to Current Situation/Hospitalization: No - Comment as needed  Activities of Daily Living   ADL Screening (condition at time of admission) Independently performs ADLs?: No Does the patient have a NEW difficulty with bathing/dressing/toileting/self-feeding that is expected to last >3 days?: No Does the patient have a NEW difficulty with getting in/out of bed, walking, or climbing stairs that is expected to last >3 days?: No Does the patient have a NEW difficulty  with communication that is expected to last >3 days?: No Is the patient deaf or have difficulty hearing?: No Does the patient have difficulty seeing, even when wearing glasses/contacts?: No Does the patient have difficulty concentrating, remembering, or making decisions?: No  Permission Sought/Granted                  Emotional Assessment         Alcohol / Substance Use: Not Applicable Psych Involvement: No (comment)  Admission diagnosis:  Sepsis due to urinary tract infection (HCC) [A41.9, N39.0] Patient Active Problem List   Diagnosis Date Noted   Sepsis due to urinary tract infection (HCC) 05/01/2023   UTI due to extended-spectrum beta lactamase (ESBL) producing Escherichia coli 02/18/2023   HTN (hypertension) 02/18/2023   Septic shock (HCC) 02/14/2023   Sepsis (HCC) 02/14/2023   Controlled type 2 diabetes mellitus with hyperglycemia (HCC) 02/14/2023   DVT (deep venous thrombosis) 09/08/22 09/08/2022   Acute left ankle pain 12/13/2021   UTI (urinary tract infection) 08/03/2021   Hypernatremia 07/30/2021   Acute urinary retention 07/30/2021   Hypokalemia 07/30/2021   Hypotension 07/29/2021   Ankle wound, left 07/29/2021   Severe sepsis (HCC) 07/28/2021   Dyslipidemia 07/28/2021   Acute metabolic encephalopathy 07/28/2021   MDD (major depressive disorder), recurrent severe, without psychosis (HCC) 11/14/2017  Severe bipolar I disorder, most recent episode depressed (HCC)    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 [place] [address] Phone: [phone] Fax: [phone]     Social Drivers of Health (SDOH) Social History: SDOH Screenings   Food Insecurity: No Food Insecurity (05/01/2023)  Housing: Low Risk  (05/01/2023)  Transportation Needs: No Transportation  Needs (05/01/2023)  Utilities: Not At Risk (05/01/2023)  Alcohol Screen: Low Risk  (11/14/2017)  Social Connections: Unknown (07/05/2021)   Received from [facility]  Tobacco Use: Medium Risk (05/01/2023)   SDOH Interventions:     Readmission Risk Interventions    05/02/2023    8:07 AM 02/14/2023    9:57 AM 08/03/2021    1:25 PM  Readmission Risk Prevention Plan  Transportation Screening Complete Complete Complete  PCP or Specialist Appt within 5-7 Days   Complete  Home Care Screening   Complete  Medication Review (RN CM)   Complete  Medication Review Oceanographer) Complete Complete   HRI or Home Care Consult Complete Complete   SW Recovery Care/Counseling Consult Complete Complete   Palliative Care Screening Not Applicable Not Applicable   Skilled Nursing Facility Complete Complete

## 2023-05-02 NOTE — Plan of Care (Signed)
  Problem: Coping: Goal: Ability to adjust to condition or change in health will improve Outcome: Progressing   Problem: Fluid Volume: Goal: Ability to maintain a balanced intake and output will improve Outcome: Progressing

## 2023-05-02 NOTE — [place] FL2 (Signed)
MEDICAID FL2 LEVEL OF CARE FORM     IDENTIFICATION  Patient Name: [name] Birthdate: [birth year] Sex: female Admission Date (Current Location): 05/01/2023  [place] and [place] Number:  Aaron Edelman [id number] Facility and Address:  [facility],  [address], [place] [address]      Provider Number: [id number]  Attending Physician Name and Address:  Cleora Fleet, MD  Relative Name and Phone Number:       Current Level of Care: Hospital Recommended Level of Care: Skilled Nursing Facility Prior Approval Number:    Date Approved/Denied:   PASRR Number:    Discharge Plan: SNF    Current Diagnoses: Patient Active Problem List   Diagnosis Date Noted   Sepsis due to urinary tract infection (HCC) 05/01/2023   UTI due to extended-spectrum beta lactamase (ESBL) producing Escherichia coli 02/18/2023   HTN (hypertension) 02/18/2023   Septic shock (HCC) 02/14/2023   Sepsis (HCC) 02/14/2023   Controlled type 2 diabetes mellitus with hyperglycemia (HCC) 02/14/2023   DVT (deep venous thrombosis) 09/08/22 09/08/2022   Acute left ankle pain 12/13/2021   UTI (urinary tract infection) 08/03/2021   Hypernatremia 07/30/2021   Acute urinary retention 07/30/2021   Hypokalemia 07/30/2021   Hypotension 07/29/2021   Ankle wound, left 07/29/2021   Severe sepsis (HCC) 07/28/2021   Dyslipidemia 07/28/2021   Acute metabolic encephalopathy 07/28/2021   MDD (major depressive disorder), recurrent severe, without psychosis (HCC) 11/14/2017   Severe bipolar I disorder, most recent episode depressed (HCC)    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/2015   Lung cancer (HCC) 04/24/2011    Orientation RESPIRATION BLADDER Height & Weight     Self, Time, Situation, Place  Normal External catheter Weight: 229 lb 11.5 oz (104.2 kg) (patient wheelchair bound) Height:  5\' 9"  (175.3 cm)  BEHAVIORAL SYMPTOMS/MOOD NEUROLOGICAL BOWEL  NUTRITION STATUS      Incontinent Diet (See d/c summary)  AMBULATORY STATUS COMMUNICATION OF NEEDS Skin   Extensive Assist Verbally Bruising, Other (Comment) (Redness to bilateral arms)                       Personal Care Assistance Level of Assistance  Bathing, Feeding, Dressing Bathing Assistance: Maximum assistance Feeding assistance: Limited assistance Dressing Assistance: Maximum assistance     Functional Limitations Info  Sight, Hearing, Speech Sight Info: Adequate Hearing Info: Adequate Speech Info: Adequate    SPECIAL CARE FACTORS FREQUENCY                       Contractures      Additional Factors Info  Code Status, Allergies, Isolation Precautions, Insulin Sliding Scale, Psychotropic Code Status Info: DNR- Limited Allergies Info: Sulfa Antibiotics Psychotropic Info: Ativan   Isolation Precautions Info: Contact precautions ESBL 02/13/23  MRSA 02/14/23     Current Medications (05/02/2023):  This is the current hospital active medication list Current Facility-Administered Medications  Medication Dose Route Frequency Provider Last Rate Last Admin   acetaminophen (TYLENOL) tablet 650 mg  650 mg Oral Q6H PRN Leeroy Bock, MD   650 mg at 05/01/23 2317   Or   acetaminophen (TYLENOL) suppository 650 mg  650 mg Rectal Q6H PRN Leeroy Bock, MD       oxyCODONE (Oxy IR/ROXICODONE) immediate release tablet 10 mg  10 mg Oral Q4H PRN Tressie Ellis, RPH   10 mg at 05/02/23 0559   And  acetaminophen (TYLENOL) tablet 325 mg  325 mg Oral Q4H PRN Tressie Ellis, RPH       apixaban (ELIQUIS) tablet 5 mg  5 mg Oral BID Leeroy Bock, MD   5 mg at 05/02/23 0981   calcium carbonate (TUMS - dosed in mg elemental calcium) chewable tablet 200 mg of elemental calcium  1 tablet Oral Q5H PRN Leeroy Bock, MD       diclofenac Sodium (VOLTAREN) 1 % topical gel 2 g  2 g Topical QID Dolly Rias, MD       folic acid (FOLVITE) tablet 1 mg  1 mg Oral  Daily Leeroy Bock, MD       gabapentin (NEURONTIN) capsule 300 mg  300 mg Oral TID Leeroy Bock, MD   300 mg at 05/02/23 1914   hydrOXYzine (ATARAX) tablet 25 mg  25 mg Oral Q6H PRN Leeroy Bock, MD       insulin aspart (novoLOG) injection 0-9 Units  0-9 Units Subcutaneous TID WC Leeroy Bock, MD       insulin glargine-yfgn (SEMGLEE) injection 10 Units  10 Units Subcutaneous Daily Leeroy Bock, MD       lactulose (CHRONULAC) 10 GM/15ML solution 20 g  20 g Oral Daily PRN Leeroy Bock, MD       lidocaine (LIDODERM) 5 % 1 patch  1 patch Transdermal Q24H Leeroy Bock, MD   1 patch at 05/01/23 2315   lithium carbonate capsule 300 mg  300 mg Oral QHS Dolly Rias, MD   300 mg at 05/02/23 0206   lithium carbonate capsule 600 mg  600 mg Oral Daily Leeroy Bock, MD       loratadine (CLARITIN) tablet 10 mg  10 mg Oral Daily Leeroy Bock, MD       melatonin tablet 9 mg  9 mg Oral QHS Jamelle Rushing L, MD   9 mg at 05/01/23 2148   meropenem (MERREM) 1 g in sodium chloride 0.9 % 100 mL IVPB  1 g Intravenous Q8H Jamelle Rushing L, MD 200 mL/hr at 05/02/23 0525 1 g at 05/02/23 0525   methimazole (TAPAZOLE) tablet 5 mg  5 mg Oral BID Leeroy Bock, MD   5 mg at 05/02/23 0523   milk and molasses enema  1 enema Rectal Once Laural Benes, Clanford L, MD       ondansetron (ZOFRAN) tablet 4 mg  4 mg Oral Q6H PRN Leeroy Bock, MD       Or   ondansetron [facility]) injection 4 mg  4 mg Intravenous Q6H PRN Leeroy Bock, MD   4 mg at 05/01/23 2149   Oral care mouth rinse  15 mL Mouth Rinse PRN Leeroy Bock, MD       polyethylene glycol (MIRALAX / GLYCOLAX) packet 17 g  17 g Oral BID Johnson, Clanford L, MD       QUEtiapine (SEROQUEL) tablet 100 mg  100 mg Oral QHS Dolly Rias, MD   100 mg at 05/02/23 0206   QUEtiapine (SEROQUEL) tablet 25 mg  25 mg Oral Daily Segars, Christiane Ha, MD       saccharomyces boulardii (FLORASTOR)  capsule 250 mg  250 mg Oral Daily Leeroy Bock, MD       tamsulosin (FLOMAX) capsule 0.4 mg  0.4 mg Oral Daily Leeroy Bock, MD       zolpidem (AMBIEN) tablet 5 mg  5 mg Oral QHS PRN Dareen Piano,  Chelsey L, MD   5 mg at 05/01/23 2315     Discharge Medications: Please see discharge summary for a list of discharge medications.  Relevant Imaging Results:  Relevant Lab Results:   Additional Information    Karn Cassis, LCSW

## 2023-05-02 NOTE — Progress Notes (Signed)
PHARMACY - PHYSICIAN COMMUNICATION CRITICAL VALUE ALERT - BLOOD CULTURE IDENTIFICATION (BCID)  [name] is an 67 y.o. female who presented to [facility] on 05/01/2023 with a chief complaint of pain.  Assessment:  Patient with recent and significant history of ESBL ecoli in blood and urine. Now again with 1/2 sets of blood cultures again positive.   Name of physician (or Provider) Contacted: Laural Benes  Current antibiotics: Meropenem  Changes to prescribed antibiotics recommended:  Patient is on recommended antibiotics - No changes needed  Results for orders placed or performed during the hospital encounter of 05/01/23  Blood Culture ID Panel (Reflexed) (Collected: 05/01/2023  5:50 PM)  Result Value Ref Range   Enterococcus faecalis NOT DETECTED NOT DETECTED   Enterococcus Faecium NOT DETECTED NOT DETECTED   Listeria monocytogenes NOT DETECTED NOT DETECTED   Staphylococcus species NOT DETECTED NOT DETECTED   Staphylococcus aureus (BCID) NOT DETECTED NOT DETECTED   Staphylococcus epidermidis NOT DETECTED NOT DETECTED   Staphylococcus lugdunensis NOT DETECTED NOT DETECTED   Streptococcus species NOT DETECTED NOT DETECTED   Streptococcus agalactiae NOT DETECTED NOT DETECTED   Streptococcus pneumoniae NOT DETECTED NOT DETECTED   Streptococcus pyogenes NOT DETECTED NOT DETECTED   A.calcoaceticus-baumannii NOT DETECTED NOT DETECTED   Bacteroides fragilis NOT DETECTED NOT DETECTED   Enterobacterales DETECTED (A) NOT DETECTED   Enterobacter cloacae complex NOT DETECTED NOT DETECTED   Escherichia coli DETECTED (A) NOT DETECTED   Klebsiella aerogenes NOT DETECTED NOT DETECTED   Klebsiella oxytoca NOT DETECTED NOT DETECTED   Klebsiella pneumoniae NOT DETECTED NOT DETECTED   Proteus species NOT DETECTED NOT DETECTED   Salmonella species NOT DETECTED NOT DETECTED   Serratia marcescens NOT DETECTED NOT DETECTED   Haemophilus influenzae NOT DETECTED NOT DETECTED   Neisseria meningitidis  NOT DETECTED NOT DETECTED   Pseudomonas aeruginosa NOT DETECTED NOT DETECTED   Stenotrophomonas maltophilia NOT DETECTED NOT DETECTED   Candida albicans NOT DETECTED NOT DETECTED   Candida auris NOT DETECTED NOT DETECTED   Candida glabrata NOT DETECTED NOT DETECTED   Candida krusei NOT DETECTED NOT DETECTED   Candida parapsilosis NOT DETECTED NOT DETECTED   Candida tropicalis NOT DETECTED NOT DETECTED   Cryptococcus neoformans/gattii NOT DETECTED NOT DETECTED   CTX-M ESBL DETECTED (A) NOT DETECTED   Carbapenem resistance IMP NOT DETECTED NOT DETECTED   Carbapenem resistance KPC NOT DETECTED NOT DETECTED   Carbapenem resistance NDM NOT DETECTED NOT DETECTED   Carbapenem resist OXA 48 LIKE NOT DETECTED NOT DETECTED   Carbapenem resistance VIM NOT DETECTED NOT DETECTED   Sheppard Coil PharmD., BCPS Clinical Pharmacist 05/02/2023 1:00 PM

## 2023-05-02 NOTE — Hospital Course (Signed)
67 y.o. female with a PMH significant for frequent ESBL UTIs, HLD, severe bipolar 1 disorder, lung cancer, DJD, MDD, acute urinary retention, type 2 diabetes mellitus, DVT.  At baseline, they live at a nursing facility and are dependent for other ADLs.  She is bedbound.   They presented from nursing facility to the ED on 05/01/2023 with reported episode of syncope while having a bowel movement.  Patient denies this episode.  Her only complaint today is pain in her right foot.  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  She requests pain medication oral and topical.  She also requests a trachea which.  Discussed patient's laboratory results of having a urinary tract infection and she states that she has difficulty initiating her urine stream and has to strain but this is not new.  She endorses suprapubic pain.  Endorses history of ESBL UTIs.  States that she has had chills and felt febrile today.  Denies nausea, vomiting, diarrhea.     They were initially treated with meropenem. Urine cultures pending.    Patient was admitted to medicine service for further workup and management of urosepsis as outlined in detail below.

## 2023-05-03 ENCOUNTER — Telehealth: Payer: Self-pay | Admitting: Gastroenterology

## 2023-05-03 DIAGNOSIS — K5903 Drug induced constipation: Secondary | ICD-10-CM

## 2023-05-03 DIAGNOSIS — A4151 Sepsis due to Escherichia coli [E. coli]: Secondary | ICD-10-CM | POA: Diagnosis not present

## 2023-05-03 DIAGNOSIS — T402X5A Adverse effect of other opioids, initial encounter: Secondary | ICD-10-CM

## 2023-05-03 DIAGNOSIS — N3001 Acute cystitis with hematuria: Secondary | ICD-10-CM | POA: Diagnosis not present

## 2023-05-03 DIAGNOSIS — N390 Urinary tract infection, site not specified: Secondary | ICD-10-CM | POA: Diagnosis not present

## 2023-05-03 DIAGNOSIS — K5289 Other specified noninfective gastroenteritis and colitis: Secondary | ICD-10-CM | POA: Diagnosis not present

## 2023-05-03 DIAGNOSIS — A419 Sepsis, unspecified organism: Secondary | ICD-10-CM | POA: Diagnosis not present

## 2023-05-03 LAB — GLUCOSE, CAPILLARY
Glucose-Capillary: 151 mg/dL — ABNORMAL HIGH (ref 70–99)
Glucose-Capillary: 149 mg/dL — ABNORMAL HIGH (ref 70–99)
Glucose-Capillary: 140 mg/dL — ABNORMAL HIGH (ref 70–99)

## 2023-05-03 LAB — URINE CULTURE: Culture: >=100000 — AB

## 2023-05-03 MED ORDER — MILK AND MOLASSES ENEMA
1.0000 | Freq: Once | RECTAL | Status: AC
  Administered 2023-05-03: 240 mL via RECTAL

## 2023-05-03 MED ORDER — LINACLOTIDE 145 MCG PO CAPS
290.0000 ug | ORAL_CAPSULE | Freq: Every day | ORAL | Status: DC
  Administered 2023-05-03: 290 ug via ORAL
  Filled 2023-05-03: qty 2

## 2023-05-03 MED ORDER — POLYETHYLENE GLYCOL 3350 17 G PO PACK
17.0000 g | PACK | Freq: Two times a day (BID) | ORAL | Status: DC
  Administered 2023-05-05: 17 g via ORAL
  Filled 2023-05-03 (×5): qty 1

## 2023-05-03 MED ORDER — LINACLOTIDE 145 MCG PO CAPS
290.0000 ug | ORAL_CAPSULE | Freq: Every day | ORAL | Status: DC
Start: 2023-05-05 — End: 2023-05-07
  Administered 2023-05-05 – 2023-05-07 (×3): 290 ug via ORAL
  Filled 2023-05-03 (×3): qty 2

## 2023-05-03 MED ORDER — PEG 3350-KCL-NA BICARB-NACL 420 G PO SOLR
4000.0000 mL | Freq: Once | ORAL | Status: AC
  Administered 2023-05-03: 4000 mL via ORAL

## 2023-05-03 NOTE — Telephone Encounter (Signed)
Needs hospital follow up in 2-3 weeks with me or Dr. Tasia Catchings.

## 2023-05-03 NOTE — Consult Note (Addendum)
Gastroenterology Consult   Referring Provider: No ref. provider found Primary Care Physician:  Patient, [name] Per Primary Gastroenterologist:  previously unassigned. Dr. Tasia Catchings  Patient ID: Guerry Bruin; [id number]; Jun 23, 1955   Admit date: 05/01/2023  LOS: 2 days   Date of Consultation: 05/03/2023  Reason for Consultation:  stool impaction/stercoral colitis    History of Present Illness   [name] is a 67 y.o. female with history of bipolar disorder, substance abuse, GERD, lung cancer, frequent ESBL UTIs, hyperlipidemia, DJD, MDD, type 2 diabetes mellitus, DVT, bedbound presenting from the nursing facility 05/01/2023 with reported episode of syncope while having a bowel movement.  In the ED: CT chest/abdomen/pelvis showed moderate large volume stool throughout the colon, rectum dilated with a large stool ball measuring 9 cm, mild circumferential rectal wall thickening and mild stranding, suggestive of stercoral colitis.  She was admitted with sepsis secondary to UTI.  Blood and urine cultures positive for ESBL.  Inpatient bowel regimen so far, she received 1 dose of MiraLAX yesterday and 1 milk and molasses enema. Patient had a large brown stool yesterday.  At SNF, she receives senna 1 tablet twice daily.  She has a as needed order for lactulose and bisacodyl suppository. She states she rarely takes these. She states she prefers scheduled medication because nursing staff at SNF seemed bothered when she asks for constipation medications.   Labs from yesterday with white blood cell count 12,000, hemoglobin 13,  Consult: patient is bedbound. Urinates/passes stool in diaper. States she strains to pass urine when she has UTI. Straining day of presentation to urinate and have BM. Staff reports she passed out. Patient is unsure. She states she generally has one BM a week at most. She takes pain medication four times a day. She is feeling pressure in her rectum. No rectal pain or  abdominal pain. Appetite is always good but last two days she has not felt as well. She knew she likely had UTI because of these symptoms. No heartburn. No vomiting. She has never had a colonoscopy. No FH colon cancer.    Prior to Admission medications   Medication Sig Start Date End Date Taking? Authorizing Provider  acetaminophen (TYLENOL) 325 MG tablet Take 650 mg by mouth every 4 (four) hours as needed for mild pain.   Yes [provider]  ascorbic acid (VITAMIN C) 500 MG tablet Take 500 mg by mouth 2 (two) times daily.   Yes [provider]  bisacodyl (DULCOLAX) 10 MG suppository Place 10 mg rectally as needed for moderate constipation.   Yes [provider]  calcium carbonate (TUMS - DOSED IN MG ELEMENTAL CALCIUM) 500 MG chewable tablet Chew 1 tablet by mouth every 5 (five) hours as needed for indigestion or heartburn.   Yes [provider]  cetirizine (ZYRTEC) 10 MG tablet Take 10 mg by mouth daily. For allergies   Yes [provider]  diclofenac Sodium (VOLTAREN) 1 % GEL Apply 2 g topically 4 (four) times daily.   Yes [provider]  ELIQUIS 5 MG TABS tablet Take 5 mg by mouth 2 (two) times daily. 01/18/23  Yes [provider]  folic acid (FOLVITE) 1 MG tablet Take 1 mg by mouth daily.   Yes [provider]  gabapentin (NEURONTIN) 100 MG capsule Take 300 mg by mouth 3 (three) times daily. 07/20/21  Yes [provider]  insulin aspart (FIASP FLEXTOUCH) 100 UNIT/ML FlexTouch Pen Inject 0-10 Units into the skin 4 (  four) times daily -  before meals and at bedtime. Short acting insulin (Aspart) per sliding scale 0-10 ---:-- Insulin injection 0-10 Units 0-10 Units Subcutaneous, 3 times daily with meals CBG 70 - 120: 0 unit CBG 121 - 150: 0 unit  CBG 151 - 200: 1 unit CBG 201 - 250: 2 units CBG 251 - 300: 4 units CBG 301 - 350: 6 units  CBG 351 - 400: 8 units  CBG > 400: 10 units 02/16/23  Yes Emokpae, Courage, MD  Insulin  Glargine (BASAGLAR KWIKPEN) 100 UNIT/ML Inject 10 Units into the skin daily. 02/22/23  Yes Osvaldo Shipper, MD  LACTOBACILLUS PO Take 1 capsule by mouth daily.   Yes [provider]  lactulose (CHRONULAC) 10 GM/15ML solution Take 30 mLs by mouth daily as needed for moderate constipation. 01/18/23  Yes [provider]  lidocaine (LIDODERM) 5 % Place 1 patch onto the skin daily. Remove & Discard patch within 12 hours or as directed by MD; Apply to LOWER LEG   Yes [provider]  lithium 600 MG capsule Take 600 mg by mouth daily. 11/24/22  Yes [provider]  lithium carbonate 300 MG capsule Take 300 mg by mouth at bedtime.   Yes [provider]  loperamide (IMODIUM A-D) 2 MG tablet Take 2 mg by mouth every 6 (six) hours as needed for diarrhea or loose stools.   Yes [provider]  LORazepam (ATIVAN) 0.5 MG tablet Take 1 tablet (0.5 mg total) by mouth at bedtime as needed for anxiety. Patient taking differently: Take 0.5 mg by mouth every 4 (four) hours as needed for anxiety. 02/22/23  Yes Osvaldo Shipper, MD  LORazepam (ATIVAN) 2 MG/ML injection Inject 1 mg into the vein every 8 (eight) hours as needed for anxiety.  05/14/23 Yes [provider]  Melatonin 10 MG TABS Take 10 mg by mouth at bedtime.   Yes [provider]  metFORMIN (GLUCOPHAGE) 500 MG tablet Take 1 pill a day Patient taking differently: Take 500 mg by mouth at bedtime. Take 1 pill a day 12/17/22  Yes Bethann Berkshire, MD  methimazole (TAPAZOLE) 5 MG tablet Take 1 tablet (5 mg total) by mouth 2 (two) times daily. 02/16/23  Yes Emokpae, Courage, MD  Multiple Vitamin (MULTIVITAMIN ADULT PO) Take 1 tablet by mouth daily.   Yes [provider]  ondansetron (ZOFRAN) 4 MG tablet Take 4 mg by mouth every 8 (eight) hours as needed. 12/14/22  Yes [provider]  oxyCODONE-acetaminophen (PERCOCET) 10-325 MG tablet Take 1 tablet by mouth every 4 (four) hours as  needed. Patient taking differently: Take 1 tablet by mouth every 4 (four) hours as needed for pain. 02/22/23  Yes Osvaldo Shipper, MD  phenol (CHLORASEPTIC) 1.4 % LIQD Use as directed 5 sprays in the mouth or throat every 2 (two) hours as needed for throat irritation / pain.   Yes [provider]  potassium bicarbonate (EFFER-K) 25 MEQ disintegrating tablet Take 25 mEq by mouth 2 (two) times daily.   Yes [provider]  QUEtiapine (SEROQUEL) 100 MG tablet Take 100 mg by mouth at bedtime.   Yes [provider]  QUEtiapine (SEROQUEL) 25 MG tablet Take 25 mg by mouth daily.   Yes [provider]  senna (SENOKOT) 8.6 MG tablet Take 1 tablet by mouth 2 (two) times daily.   Yes [provider]  tamsulosin (FLOMAX) 0.4 MG CAPS capsule Take 0.4 mg by mouth daily.   Yes [provider]  ARIPiprazole (ABILIFY) 10 MG tablet Take 10 mg by mouth daily.  04/20/23  [provider]    Current Facility-Administered Medications  Medication Dose Route Frequency Provider Last Rate Last Admin   acetaminophen (TYLENOL) tablet 650 mg  650 mg Oral Q6H PRN Leeroy Bock, MD   650 mg at 05/02/23 2033   Or   acetaminophen (TYLENOL) suppository 650 mg  650 mg Rectal Q6H PRN Leeroy Bock, MD       oxyCODONE (Oxy IR/ROXICODONE) immediate release tablet 10 mg  10 mg Oral Q4H PRN Tressie Ellis, RPH   10 mg at 05/03/23 4540   And   acetaminophen (TYLENOL) tablet 325 mg  325 mg Oral Q4H PRN Tressie Ellis, RPH       apixaban Everlene Balls) tablet 5 mg  5 mg Oral BID Leeroy Bock, MD   5 mg at 05/02/23 2228   calcium carbonate (TUMS - dosed in mg elemental calcium) chewable tablet 200 mg of elemental calcium  1 tablet Oral Q5H PRN Leeroy Bock, MD       Chlorhexidine Gluconate Cloth 2 % PADS 6 each  6 each Topical Q0600 Cleora Fleet, MD   6 each at 05/03/23 9811   diclofenac Sodium (VOLTAREN) 1 % topical gel 2 g  2 g Topical QID  Dolly Rias, MD   2 g at 05/02/23 2228   folic acid (FOLVITE) tablet 1 mg  1 mg Oral Daily Leeroy Bock, MD   1 mg at 05/02/23 0827   gabapentin (NEURONTIN) capsule 300 mg  300 mg Oral TID Leeroy Bock, MD   300 mg at 05/02/23 2229   hydrOXYzine (ATARAX) tablet 25 mg  25 mg Oral Q6H PRN Leeroy Bock, MD       insulin aspart (novoLOG) injection 0-9 Units  0-9 Units Subcutaneous TID WC Leeroy Bock, MD   2 Units at 05/03/23 0852   insulin glargine-yfgn (SEMGLEE) injection 10 Units  10 Units Subcutaneous Daily Leeroy Bock, MD   10 Units at 05/02/23 1017   lactulose (CHRONULAC) 10 GM/15ML solution 20 g  20 g Oral Daily PRN Leeroy Bock, MD       lidocaine (LIDODERM) 5 % 1 patch  1 patch Transdermal Q24H Leeroy Bock, MD   1 patch at 05/02/23 2231   lithium carbonate capsule 300 mg  300 mg Oral QHS Dolly Rias, MD   300 mg at 05/02/23 2227   lithium carbonate capsule 600 mg  600 mg Oral Daily Leeroy Bock, MD       loratadine (CLARITIN) tablet 10 mg  10 mg Oral Daily Leeroy Bock, MD   10 mg at 05/02/23 0826   melatonin tablet 9 mg  9 mg Oral QHS Leeroy Bock, MD   9 mg at 05/02/23 2227   meropenem (MERREM) 1 g in sodium chloride 0.9 % 100 mL IVPB  1 g Intravenous Q8H Jamelle Rushing L, MD 200 mL/hr at 05/03/23 0559 1 g at 05/03/23 0559   methimazole (TAPAZOLE) tablet 5 mg  5 mg Oral BID Leeroy Bock, MD   5 mg at 05/02/23 2229   mupirocin ointment (BACTROBAN) 2 % 1 Application  1 Application Nasal BID Cleora Fleet, MD   1 Application at 05/02/23 2228   ondansetron (ZOFRAN) tablet 4 mg  4 mg Oral Q6H PRN Leeroy Bock, MD       Or  ondansetron (ZOFRAN) injection 4 mg  4 mg Intravenous Q6H PRN Leeroy Bock, MD   4 mg at 05/01/23 2149   Oral care mouth rinse  15 mL Mouth Rinse PRN Leeroy Bock, MD       polyethylene glycol (MIRALAX / GLYCOLAX) packet 17 g  17 g Oral BID Johnson, Clanford  L, MD   17 g at 05/02/23 1014   QUEtiapine (SEROQUEL) tablet 100 mg  100 mg Oral QHS Dolly Rias, MD   100 mg at 05/02/23 2228   QUEtiapine (SEROQUEL) tablet 25 mg  25 mg Oral Daily Segars, Christiane Ha, MD       saccharomyces boulardii (FLORASTOR) capsule 250 mg  250 mg Oral Daily Leeroy Bock, MD   250 mg at 05/02/23 0827   tamsulosin (FLOMAX) capsule 0.4 mg  0.4 mg Oral Daily Jamelle Rushing L, MD   0.4 mg at 05/02/23 7371   zolpidem (AMBIEN) tablet 5 mg  5 mg Oral QHS PRN Leeroy Bock, MD   5 mg at 05/01/23 2315    Allergies as of 05/01/2023 - Review Complete 05/01/2023  Allergen Reaction Noted   Sulfa antibiotics Swelling and Rash 08/30/2010    Past Medical History:  Diagnosis Date   Allergy    Bipolar disorder [facility])    Depression    Drug abuse (HCC)    history of, went to rehab 2015   GERD (gastroesophageal reflux disease)    Lung cancer (HCC)    lung ca dx 11/11- right upper lobe    Past Surgical History:  Procedure Laterality Date   ABDOMINAL AORTOGRAM W/LOWER EXTREMITY Bilateral 01/10/2022   Procedure: ABDOMINAL AORTOGRAM W/LOWER EXTREMITY;  Surgeon: Nada Libman, MD;  Location: [facility];  Service: Cardiovascular;  Laterality: Bilateral;   ANKLE FRACTURE SURGERY Left    HARDWARE REMOVAL Left 12/13/2021   Procedure: LEFT ANKLE HARDWARE REMOVAL;  Surgeon: Marcene Corning, MD;  Location: [facility];  Service: Orthopedics;  Laterality: Left;   LUNG LOBECTOMY Right 2011   RUL removed for lung cancer   STYLOID PROCESS EXCISION Left 10/16/2014   Procedure: EXCISION LEFT STYLOID PROCESS;  Surgeon: Drema Halon, MD;  Location: [facility];  Service: ENT;  Laterality: Left;   TONSILLECTOMY Bilateral 10/16/2014   Procedure: BILATERAL TONSILLECTOMY;  Surgeon: Drema Halon, MD;  Location: [facility];  Service: ENT;  Laterality: Bilateral;   TUBAL LIGATION  1984    Family History  Problem Relation Age of Onset    Cancer Mother        breast cancer   Cancer Father        stomach cancer   Diabetes Father    Cancer Sister        breast cancer   Diabetes Brother    Diabetes Brother    Cancer Sister        breast cancer    Social History   Socioeconomic History   Marital status: Single    Spouse name: Not on file   Number of children: Not on file   Years of education: Not on file   Highest education level: Not on file  Occupational History   Not on file  Tobacco Use   Smoking status: Former    Current packs/day: 1.00    Average packs/day: 1 pack/day for 40.0 years (40.0 ttl pk-yrs)    Types: Cigarettes   Smokeless tobacco: Never  Vaping Use   Vaping status: Former  Substance  and Sexual Activity   Alcohol use: Not Currently    Comment: hx alcoholism.  none since 2017   Drug use: Not Currently    Types: Benzodiazepines    Comment: hx of xanax abuse. last use 2015   Sexual activity: Not on file  Other Topics Concern   Not on file  Social History Narrative   Not on file   Social Drivers of Health   Financial Resource Strain: Not on file  Food Insecurity: No Food Insecurity (05/01/2023)   Hunger Vital Sign    Worried About Running Out of Food in the Last Year: Never true    Ran Out of Food in the Last Year: Never true  Transportation Needs: No Transportation Needs (05/01/2023)   PRAPARE - Administrator, Civil Service (Medical): No    Lack of Transportation (Non-Medical): No  Physical Activity: Not on file  Stress: Not on file  Social Connections: Unknown (07/05/2021)   Received from [facility]   Social Connection and Isolation Panel [NHANES]    Frequency of Communication with Friends and Family: Never    Frequency of Social Gatherings with Friends and Family: Never    Attends Religious Services: Never    Database administrator or Organizations: No    Attends Banker Meetings: Never    Marital Status: Patient declined   Intimate Partner Violence: Patient Declined (05/01/2023)   Humiliation, Afraid, Rape, and Kick questionnaire    Fear of Current or Ex-Partner: Patient declined    Emotionally Abused: Patient declined    Physically Abused: Patient declined    Sexually Abused: Patient declined     Review of System:   General: Negative for anorexia, weight loss, fever, chills, fatigue, weakness. Eyes: Negative for vision changes.  ENT: Negative for hoarseness, difficulty swallowing, nasal congestion. CV: Negative for chest pain, angina, palpitations, dyspnea on exertion, peripheral edema.  Respiratory: Negative for dyspnea at rest, dyspnea on exertion, cough, sputum, wheezing.  GI: See history of present illness. GU:  Negative for dysuria, hematuria, urinary incontinence, urinary frequency, nocturnal urination. See hpi ZO:XWRUEAV pain Derm: Negative for rash or itching.  Neuro: Negative for weakness, abnormal sensation, seizure, frequent headaches, memory loss, confusion.  Psych: Negative for anxiety, depression, suicidal ideation, hallucinations.  Endo: Negative for unusual weight change.  Heme: Negative for bruising or bleeding. Allergy: Negative for rash or hives.      Physical Examination:   Vital signs in last 24 hours: Temp:  [98.2 F (36.8 C)-99.2 F (37.3 C)] 98.2 F (36.8 C) (12/19 0531) Pulse Rate:  [76-91] 76 (12/19 0531) Resp:  [18] 18 (12/19 0531) BP: (106-129)/(68-78) 106/71 (12/19 0531) SpO2:  [95 %-98 %] 98 % (12/19 0531) Last BM Date : 05/02/23  General: obese, appears older than stated age. no acute distress.  Head: Normocephalic, atraumatic.   Eyes: Conjunctiva pink, no icterus. Mouth: Oropharyngeal mucosa moist and pink   Neck: Supple without thyromegaly, masses, or lymphadenopathy.  Lungs: Clear to auscultation bilaterally.  Heart: Regular rate and rhythm, no murmurs rubs or gallops.  Abdomen: Bowel sounds are normal, nontender, nondistended, no hepatosplenomegaly or  masses, no abdominal bruits or hernia , no rebound or guarding.   Rectal: no masses in rectal vault. Soft brown stool present up high in the rectum, did not appreciate any hard large stool balls Extremities: 1+ lower extremity edema, clubbing, deformity.  Neuro: Alert and oriented x 4 , grossly normal neurologically.  Skin: Warm and  dry, no rash or jaundice.   Psych: Alert and cooperative, normal mood and affect.        Intake/Output from previous day: 12/18 0701 - 12/19 0700 In: 1160.4 [P.O.:960; IV Piggyback:200.4] Out: 1700 [Urine:1700] Intake/Output this shift: No intake/output data recorded.  Lab Results:   CBC Recent Labs    05/01/23 1750 05/02/23 0435  WBC 7.0 12.0*  HGB 14.7 13.0  HCT 45.6 41.0  MCV 93.4 93.2  PLT 195 197   BMET Recent Labs    05/01/23 1750 05/02/23 0435  NA 138 137  K 3.8 3.9  CL 103 103  CO2 23 26  GLUCOSE 184* 144*  BUN 15 13  CREATININE 0.54 0.46  CALCIUM 9.7 9.1   LFT Recent Labs    05/01/23 1750  BILITOT 0.7  ALKPHOS 104  AST 18  ALT 28  PROT 7.5  ALBUMIN 3.7    Lipase No results for input(s): "LIPASE" in the last 72 hours.  PT/INR Recent Labs    05/01/23 1750  LABPROT 13.5  INR 1.0     Hepatitis Panel No results for input(s): "HEPBSAG", "HCVAB", "HEPAIGM", "HEPBIGM" in the last 72 hours.   Imaging Studies:   US THYROID Result Date: 05/03/2023 CLINICAL DATA:  Right thyroid nodule EXAM: THYROID ULTRASOUND TECHNIQUE: Ultrasound examination of the thyroid gland and adjacent soft tissues was performed. COMPARISON:  Ultrasound thyroid 02/14/2022 CT chest abdomen pelvis 05/01/2023 FINDINGS: Parenchymal Echotexture: Mildly heterogenous Isthmus: 0.2 cm Right lobe: 5.2 x 2.5 x 3.2 cm Left lobe: 4.7 x 1.5 x 1.6 cm _________________________________________________________ Estimated total number of nodules >/= 1 cm: 2 Number of spongiform nodules >/=  2 cm not described below (TR1): 0 Number of mixed cystic and solid nodules  >/= 1.5 cm not described below (TR2): 0 _________________________________________________________ Nodule # 1: Location: Isthmus; Mid Maximum size: 1.0 cm; Other 2 dimensions: 0.7 x 0.5 cm Composition: solid/almost completely solid (2) Echogenicity: hypoechoic (2) Shape: not taller-than-wide (0) Margins: smooth (0) Echogenic foci: none (0) ACR TI-RADS total points: 4. ACR TI-RADS risk category: TR4 (4-6 points). ACR TI-RADS recommendations: *Given size (>/= 1 - 1.4 cm) and appearance, a follow-up ultrasound in 1 year should be considered based on TI-RADS criteria. _________________________________________________________ Nodule 2: 3.3 x 3.0 x 2.4 cm mixed solid cystic isoechoic right mid thyroid nodule (TI-RADS 2) does not meet criteria for imaging surveillance or FNA. _________________________________________________________ Subcentimeter left thyroid nodules do not meet criteria for FNA or imaging follow-up. IMPRESSION: 1. Mixed solid cystic right mid thyroid nodule corresponds to the abnormality seen on CT and does not meet criteria for FNA or imaging follow-up. 2. Nodule 1 (TI-RADS 4), measuring 1.0 cm, located in the isthmus meets criteria for imaging follow-up. Annual ultrasound surveillance is recommended until 5 years of stability is documented. The above is in keeping with the ACR TI-RADS recommendations - J Am Coll Radiol 2017;14:587-595. Electronically Signed   By: Acquanetta Belling M.D.   On: 05/03/2023 08:25   CT CHEST ABDOMEN PELVIS W CONTRAST Result Date: 05/01/2023 CLINICAL DATA:  Sepsis EXAM: CT CHEST, ABDOMEN, AND PELVIS WITH CONTRAST TECHNIQUE: Multidetector CT imaging of the chest, abdomen and pelvis was performed following the standard protocol during bolus administration of intravenous contrast. RADIATION DOSE REDUCTION: This exam was performed according to the departmental dose-optimization program which includes automated exposure control, adjustment of the mA and/or kV according to patient  size and/or use of iterative reconstruction technique. CONTRAST:  OMNIPAQUE IOHEXOL 300 MG/ML  SOLN COMPARISON:  01/24/2023,  11/13/2014, 09/05/2013 FINDINGS: CT CHEST FINDINGS Cardiovascular: Heart size is upper limits of normal. No pericardial effusion. Thoracic aorta is nonaneurysmal. Scattered aortic atherosclerosis. Central pulmonary vasculature is within normal limits. Mediastinum/Nodes: No axillary, mediastinal, or hilar lymphadenopathy. 2.6 cm low-attenuation nodule in the right thyroid lobe. Trachea and esophagus demonstrate no significant abnormality. Lungs/Pleura: Postsurgical changes within the right upper lobe. Minor left basilar atelectasis. Lungs are otherwise clear. No pleural effusion or pneumothorax. Musculoskeletal: No chest wall mass or suspicious bone lesions identified. Exaggerated thoracic kyphosis. CT ABDOMEN PELVIS FINDINGS Hepatobiliary: Liver measures 22 cm in length. No focal liver abnormality is seen. No gallstones, gallbladder wall thickening, or biliary dilatation. Pancreas: Unremarkable. No pancreatic ductal dilatation or surrounding inflammatory changes. Spleen: Normal in size without focal abnormality. Adrenals/Urinary Tract: Unremarkable adrenal glands. Longstanding stable hyperattenuating lesion along the medial aspect of the left kidney measuring 2.0 cm which requires no dedicated follow-up imaging. Tiny nonobstructing left renal stone. Right kidney is unremarkable. No hydronephrosis. Urinary bladder within normal limits for the degree of distension. Stomach/Bowel: Moderate-large volume stool throughout the colon. Rectum is dilated with a large stool ball measuring 9 cm in diameter. Mild circumferential rectal wall thickening and mild fat stranding. Tiny hiatal hernia. No dilated loops of small bowel to suggest obstruction. Vascular/Lymphatic: Aortic atherosclerosis. No enlarged abdominal or pelvic lymph nodes. Reproductive: Atrophic uterus.  No adnexal masses. Other: No  free fluid. No abdominopelvic fluid collection. No pneumoperitoneum. No abdominal wall hernia. Musculoskeletal: Incidentally noted 7 cm lipoma anterior to the left hip. No acute osseous abnormality. IMPRESSION: 1. Moderate-large volume stool throughout the colon. Rectum is dilated with a large stool ball measuring 9 cm in diameter. Mild circumferential rectal wall thickening and mild fat stranding, suggestive of stercoral colitis. 2. No acute intrathoracic findings. 3. Tiny nonobstructing left renal stone. 4. A 2.6 cm low-attenuation nodule in the right thyroid lobe. Recommend thyroid US (ref: J Am Coll Radiol. 2015 Feb;12(2): 143-50). 5. Hepatomegaly. 6. Aortic atherosclerosis (ICD10-I70.0). Electronically Signed   By: Duanne Guess D.O.   On: 05/01/2023 20:54   DG Chest Port 1 View Result Date: 05/01/2023 CLINICAL DATA:  Sepsis.  Constipation. EXAM: PORTABLE CHEST 1 VIEW COMPARISON:  X-ray 02/18/2023. FINDINGS: Kyphotic x-ray obscures the apices. Film is also rotated. Stable enlarged cardiopericardial silhouette with tortuous ectatic aorta. No pneumothorax or effusion. No edema. There is some linear opacity left lung base likely scar or atelectasis. Overlapping cardiac leads. Film is under penetrated. IMPRESSION: Limited radiograph.  Stable enlarged cardiopericardial silhouette. Left basilar scar or atelectasis.  No consolidation Electronically Signed   By: Karen Kays M.D.   On: 05/01/2023 18:11  [4 week]  Assessment:   67 y/o bedbound female with chronic pain on ATC narcotics, bipolar disorder, substance abuse, GERD, lung cancer, frequent ESBL UTIs, hyperlipidemia, DJD, MDD, type 2 diabetes mellitus, DVT, chronically anticoagulated presenting from the nursing facility 05/01/2023 with reported episode of syncope while having a bowel movement. GI consulted for stercoral colitis/fecal impaction.   Constipation/fecal impaction/stercoral colitis: chronically constipated having BM once weekly at best.   Exacerbated by her lack of mobility and chronic narcotic use.  Daily bowel regimen at SNF consist of Senna twice daily.  Current CT imaging with moderate to large volume stool throughout the colon.  Rectum dilated with large stool ball measuring 9 cm.  Mild circumferential rectal wall thickening and fat stranding suggestive of stercoral colitis.  On DRE today, within reach there was only soft stool in the rectal vault, no masses.  Patient  has never had a colon anoscopy.  Plan:   Milk and molasses enema today. Continue MiraLAX 17 g twice daily for now.  This will take a while to take effect.   Linzess 290 mcg daily starting today.   We will need to determine most effective bowel regimen for her and would recommend scheduled dosing at SNF in order to prevent recurrent impaction.   LOS: 2 days   We would like to thank you for the opportunity to participate in the care of CYNDE CABAL.  Leanna Battles. [facility]os [facility] [phone] 12/19/20249:25 AM  Addendum: will give golytely prep today for colon purge/impaction. After colon purge, recommend Linzess daily but if not effective, she may require use of Movantik due to chronic opioid use.   Due to abnormal colon on imaging, no prior colonoscopy, she should have outpatient colonoscopy. We will make arrangements for follow office visit.   Leanna Battles. Dixon Boos [facility] [phone] 12/19/20245:08 PM

## 2023-05-03 NOTE — Plan of Care (Signed)

## 2023-05-03 NOTE — Progress Notes (Signed)
PROGRESS NOTE  [name] GNF:[id number] DOB: 11-07-1955 DOA: 05/01/2023 PCP: Patient, [name] Per  Brief History:  67 y.o. female with a PMH significant for frequent ESBL UTIs, HLD, severe bipolar 1 disorder, lung cancer, DJD, MDD, acute urinary retention, type 2 diabetes mellitus, DVT.  At baseline, they live at a nursing facility and are dependent for other ADLs.  She is bedbound.   They presented from nursing facility to the ED on 05/01/2023 with reported episode of syncope while having a bowel movement.  Patient denies this episode.  Her only complaint today is pain in her right foot.  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  She requests pain medication oral and topical.  She also requests a trachea which.  Discussed patient's laboratory results of having a urinary tract infection and she states that she has difficulty initiating her urine stream and has to strain but this is not new.  She endorses suprapubic pain.  Endorses history of ESBL UTIs.  States that she has had chills and felt febrile today.  Denies nausea, vomiting, diarrhea.     They were initially treated with meropenem. Urine cultures pending.    Patient was admitted to medicine service for further workup and management of urosepsis as outlined in detail below.   Assessment/Plan: Severe Sepsis secondary to ESBL E coli UTI  - present on admission - continue meropenem  - blood cultures positive for gram negative rods - follow up C&S data - urinary retention most likely from severe constipation (opioid induced)  Gram negative bacteremia -continue merrem   Constipation Stercoral colitis  - continue miralax BID, plus nightly senna - molasses enema ordered 12/18 and 12/19 - intensify therapy if needed - appreciate GI eval - start Linzess   Urinary Retention  - continue flomax   Chronic pain / opioid dependence  - she is back on home pain management regimen -she is on oxycodone 10 mg    Uncontrolled DM2 with hyperglycemia -continue Semglee 10 units daily -continue novolog sliding scale  History of DVT  - continue apixaban    Hyperthyroidism  - home methimazole resumed   Right thyroid nodule  - US thyroid>>mixed solid/cystic R-midthyroid nodule does not meet criteria for FNA or imaging;  1.0 cm isthmus nodule--annual survelance   Bipolar and MDD - resumed home medication   Hx of DVT -continue apixaban   Family Communication: no  Family at bedside  Consultants:  GI  Code Status:  DNR  DVT Prophylaxis:  apixaban   Procedures: As Listed in Progress Note Above  Antibiotics: Merrem 12/17>>      Subjective: Pt complains of chronic leg pain.  Denies f/c, cp, sob, n/v/d, abd pain  Objective: Vitals:   05/02/23 0948 05/02/23 2038 05/03/23 0531 05/03/23 1238  BP: 108/68 129/78 106/71 (!) 126/54  Pulse: 91 87 76 85  Resp: 18 18 18  (!) 24  Temp:  99.2 F (37.3 C) 98.2 F (36.8 C) 97.7 F (36.5 C)  TempSrc:      SpO2: 95% 97% 98% 99%  Weight:      Height:        Intake/Output Summary (Last 24 hours) at 05/03/2023 1509 Last data filed at 05/03/2023 1115 Gross per 24 hour  Intake 480 ml  Output 1750 ml  Net -1270 ml   Weight change:  Exam:  General:  Pt is alert, follows commands appropriately, not in acute distress HEENT: No icterus,  No thrush, No neck mass, [place]/AT Cardiovascular: RRR, S1/S2, no rubs, no gallops Respiratory: CTA bilaterally, no wheezing, no crackles, no rhonchi Abdomen: Soft/+BS, non tender, non distended, no guarding Extremities: No edema, No lymphangitis, No petechiae, No rashes, no synovitis   Data Reviewed: I have personally reviewed following labs and imaging studies Basic Metabolic Panel: Recent Labs  Lab 05/01/23 1750 05/02/23 0435  NA 138 137  K 3.8 3.9  CL 103 103  CO2 23 26  GLUCOSE 184* 144*  BUN 15 13  CREATININE 0.54 0.46  CALCIUM 9.7 9.1   Liver Function Tests: Recent Labs  Lab  05/01/23 1750  AST 18  ALT 28  ALKPHOS 104  BILITOT 0.7  PROT 7.5  ALBUMIN 3.7   No results for input(s): "LIPASE", "AMYLASE" in the last 168 hours. No results for input(s): "AMMONIA" in the last 168 hours. Coagulation Profile: Recent Labs  Lab 05/01/23 1750  INR 1.0   CBC: Recent Labs  Lab 05/01/23 1750 05/02/23 0435  WBC 7.0 12.0*  NEUTROABS 6.5  --   HGB 14.7 13.0  HCT 45.6 41.0  MCV 93.4 93.2  PLT 195 197   Cardiac Enzymes: No results for input(s): "CKTOTAL", "CKMB", "CKMBINDEX", "TROPONINI" in the last 168 hours. BNP: Invalid input(s): "POCBNP" CBG: Recent Labs  Lab 05/02/23 0741 05/02/23 1139 05/02/23 1658 05/03/23 0839 05/03/23 1108  GLUCAP 151* 137* 174* 151* 149*   HbA1C: No results for input(s): "HGBA1C" in the last 72 hours. Urine analysis:    Component Value Date/Time   COLORURINE YELLOW 05/01/2023 1700   APPEARANCEUR HAZY (A) 05/01/2023 1700   LABSPEC 1.012 05/01/2023 1700   PHURINE 6.0 05/01/2023 1700   GLUCOSEU NEGATIVE 05/01/2023 1700   HGBUR SMALL (A) 05/01/2023 1700   BILIRUBINUR NEGATIVE 05/01/2023 1700   KETONESUR NEGATIVE 05/01/2023 1700   PROTEINUR 30 (A) 05/01/2023 1700   UROBILINOGEN 0.2 03/11/2010 1057   NITRITE NEGATIVE 05/01/2023 1700   LEUKOCYTESUR LARGE (A) 05/01/2023 1700   Sepsis Labs: @LABRCNTIP (procalcitonin:4,lacticidven:4) ) Recent Results (from the past 240 hours)  Urine Culture     Status: Abnormal   Collection Time: 05/01/23  5:00 PM   Specimen: Urine, Random  Result Value Ref Range Status   Specimen Description   Final    URINE, RANDOM Performed at [facility], [address] [place], [place] [address]    Special Requests   Final    NONE Reflexed from [id number] Performed at [facility], [address], [place], [place] [address]    Culture (A)  Final    >=100,000 COLONIES/mL ESCHERICHIA COLI Confirmed Extended Spectrum Beta-Lactamase Producer (ESBL).  In bloodstream infections from ESBL organisms,  carbapenems are preferred over piperacillin/tazobactam. They are shown to have a lower risk of mortality.    Report Status 05/03/2023 FINAL  Final   Organism ID, Bacteria ESCHERICHIA COLI (A)  Final      Susceptibility   Escherichia coli - MIC*    AMPICILLIN >=32 RESISTANT Resistant     CEFAZOLIN >=64 RESISTANT Resistant     CEFEPIME 16 RESISTANT Resistant     CEFTRIAXONE >=64 RESISTANT Resistant     CIPROFLOXACIN >=4 RESISTANT Resistant     GENTAMICIN <=1 SENSITIVE Sensitive     IMIPENEM <=0.25 SENSITIVE Sensitive     NITROFURANTOIN <=16 SENSITIVE Sensitive     TRIMETH/SULFA <=20 SENSITIVE Sensitive     AMPICILLIN/SULBACTAM >=32 RESISTANT Resistant     PIP/TAZO 8 SENSITIVE Sensitive ug/mL    * >=100,000 COLONIES/mL ESCHERICHIA COLI  Blood  Culture (routine x 2)     Status: Abnormal (Preliminary result)   Collection Time: 05/01/23  5:50 PM   Specimen: BLOOD RIGHT ARM  Result Value Ref Range Status   Specimen Description   Final    BLOOD RIGHT ARM Performed at [facility], [address], [place], [place] [address]    Special Requests   Final    BOTTLES DRAWN AEROBIC AND ANAEROBIC Blood Culture adequate volume Performed at [facility], [address] [place], [place] [address]    Culture  Setup Time   Final    GRAM NEGATIVE RODS IN BOTH AEROBIC AND ANAEROBIC BOTTLES CRITICAL RESULT CALLED TO, READ BACK BY AND VERIFIED WITH: SHENELL BLACKWELL AT 8657 05/02/2023 BY T KENNEDY Organism ID to follow    Culture (A)  Final    ESCHERICHIA COLI SUSCEPTIBILITIES TO FOLLOW Performed at [facility], [address], [place], [place] [address]    Report Status PENDING  Incomplete  Blood Culture (routine x 2)     Status: None (Preliminary result)   Collection Time: 05/01/23  5:50 PM   Specimen: BLOOD  Result Value Ref Range Status   Specimen Description BLOOD BLOOD LEFT ARM  Final   Special Requests   Final    BOTTLES DRAWN AEROBIC AND ANAEROBIC Blood Culture adequate  volume   Culture   Final    NO GROWTH 2 DAYS Performed at [facility], [address] [place], [place] [address]    Report Status PENDING  Incomplete  Blood Culture ID Panel (Reflexed)     Status: Abnormal   Collection Time: 05/01/23  5:50 PM  Result Value Ref Range Status   Enterococcus faecalis NOT DETECTED NOT DETECTED Final   Enterococcus Faecium NOT DETECTED NOT DETECTED Final   Listeria monocytogenes NOT DETECTED NOT DETECTED Final   Staphylococcus species NOT DETECTED NOT DETECTED Final   Staphylococcus aureus (BCID) NOT DETECTED NOT DETECTED Final   Staphylococcus epidermidis NOT DETECTED NOT DETECTED Final   Staphylococcus lugdunensis NOT DETECTED NOT DETECTED Final   Streptococcus species NOT DETECTED NOT DETECTED Final   Streptococcus agalactiae NOT DETECTED NOT DETECTED Final   Streptococcus pneumoniae NOT DETECTED NOT DETECTED Final   Streptococcus pyogenes NOT DETECTED NOT DETECTED Final   A.calcoaceticus-baumannii NOT DETECTED NOT DETECTED Final   Bacteroides fragilis NOT DETECTED NOT DETECTED Final   Enterobacterales DETECTED (A) NOT DETECTED Final    Comment: Enterobacterales represent a large order of gram negative bacteria, not a single organism. CRITICAL RESULT CALLED TO, READ BACK BY AND VERIFIED WITH: F. Andrey Campanile PHARMD, AT 1255 05/02/23 D. VANHOOK    Enterobacter cloacae complex NOT DETECTED NOT DETECTED Final   Escherichia coli DETECTED (A) NOT DETECTED Final    Comment: CRITICAL RESULT CALLED TO, READ BACK BY AND VERIFIED WITH: F. Andrey Campanile PHARMD, AT 1255 05/02/23 D. VANHOOK    Klebsiella aerogenes NOT DETECTED NOT DETECTED Final   Klebsiella oxytoca NOT DETECTED NOT DETECTED Final   Klebsiella pneumoniae NOT DETECTED NOT DETECTED Final   Proteus species NOT DETECTED NOT DETECTED Final   Salmonella species NOT DETECTED NOT DETECTED Final   Serratia marcescens NOT DETECTED NOT DETECTED Final   Haemophilus influenzae NOT DETECTED NOT DETECTED Final    Neisseria meningitidis NOT DETECTED NOT DETECTED Final   Pseudomonas aeruginosa NOT DETECTED NOT DETECTED Final   Stenotrophomonas maltophilia NOT DETECTED NOT DETECTED Final   Candida albicans NOT DETECTED NOT DETECTED Final   Candida auris NOT DETECTED NOT DETECTED  Final   Candida glabrata NOT DETECTED NOT DETECTED Final   Candida krusei NOT DETECTED NOT DETECTED Final   Candida parapsilosis NOT DETECTED NOT DETECTED Final   Candida tropicalis NOT DETECTED NOT DETECTED Final   Cryptococcus neoformans/gattii NOT DETECTED NOT DETECTED Final   CTX-M ESBL DETECTED (A) NOT DETECTED Final    Comment: CRITICAL RESULT CALLED TO, READ BACK BY AND VERIFIED WITH: FAndrey Campanile PHARMD, AT 1255 05/02/23 D. VANHOOK (NOTE) Extended spectrum beta-lactamase detected. Recommend a carbapenem as initial therapy.      Carbapenem resistance IMP NOT DETECTED NOT DETECTED Final   Carbapenem resistance KPC NOT DETECTED NOT DETECTED Final   Carbapenem resistance NDM NOT DETECTED NOT DETECTED Final   Carbapenem resist OXA 48 LIKE NOT DETECTED NOT DETECTED Final   Carbapenem resistance VIM NOT DETECTED NOT DETECTED Final    Comment: Performed at [facility], [address] [place], [place] [address]  MRSA Next Gen by PCR, Nasal     Status: Abnormal   Collection Time: 05/01/23  9:49 PM   Specimen: Nasal Mucosa; Nasal Swab  Result Value Ref Range Status   MRSA by PCR Next Gen DETECTED (A) NOT DETECTED Final    Comment: CRITICAL RESULT CALLED TO, READ BACK BY AND VERIFIED WITH: J. HARTFORD AT 0111 ON 12.18.24 BY ADGER J         The GeneXpert MRSA Assay (FDA approved for NASAL specimens only), is one component of a comprehensive MRSA colonization surveillance program. It is not intended to diagnose MRSA infection nor to guide or monitor treatment for MRSA infections. Performed at [facility], [address], [place], [place] [address]      Scheduled Meds:  apixaban  5 mg Oral BID    Chlorhexidine Gluconate Cloth  6 each Topical Q0600   diclofenac Sodium  2 g Topical QID   folic acid  1 mg Oral Daily   gabapentin  300 mg Oral TID   insulin aspart  0-9 Units Subcutaneous TID WC   insulin glargine-yfgn  10 Units Subcutaneous Daily   lidocaine  1 patch Transdermal Q24H   linaclotide  290 mcg Oral QAC breakfast   lithium carbonate  300 mg Oral QHS   lithium carbonate  600 mg Oral Daily   loratadine  10 mg Oral Daily   melatonin  9 mg Oral QHS   methimazole  5 mg Oral BID   mupirocin ointment  1 Application Nasal BID   polyethylene glycol  17 g Oral BID   QUEtiapine  100 mg Oral QHS   QUEtiapine  25 mg Oral Daily   saccharomyces boulardii  250 mg Oral Daily   tamsulosin  0.4 mg Oral Daily   Continuous Infusions:  meropenem (MERREM) IV 1 g (05-17-23 1413)    Procedures/Studies: US THYROID Result Date: May 17, 2023 CLINICAL DATA:  Right thyroid nodule EXAM: THYROID ULTRASOUND TECHNIQUE: Ultrasound examination of the thyroid gland and adjacent soft tissues was performed. COMPARISON:  Ultrasound thyroid 02/14/2022 CT chest abdomen pelvis 05/01/2023 FINDINGS: Parenchymal Echotexture: Mildly heterogenous Isthmus: 0.2 cm Right lobe: 5.2 x 2.5 x 3.2 cm Left lobe: 4.7 x 1.5 x 1.6 cm _________________________________________________________ Estimated total number of nodules >/= 1 cm: 2 Number of spongiform nodules >/=  2 cm not described below (TR1): 0 Number of mixed cystic and solid nodules >/= 1.5 cm not described below (TR2): 0 _________________________________________________________ Nodule # 1: Location: Isthmus; Mid Maximum size: 1.0 cm; Other 2 dimensions: 0.7 x 0.5 cm Composition: solid/almost  completely solid (2) Echogenicity: hypoechoic (2) Shape: not taller-than-wide (0) Margins: smooth (0) Echogenic foci: none (0) ACR TI-RADS total points: 4. ACR TI-RADS risk category: TR4 (4-6 points). ACR TI-RADS recommendations: *Given size (>/= 1 - 1.4 cm) and appearance, a follow-up  ultrasound in 1 year should be considered based on TI-RADS criteria. _________________________________________________________ Nodule 2: 3.3 x 3.0 x 2.4 cm mixed solid cystic isoechoic right mid thyroid nodule (TI-RADS 2) does not meet criteria for imaging surveillance or FNA. _________________________________________________________ Subcentimeter left thyroid nodules do not meet criteria for FNA or imaging follow-up. IMPRESSION: 1. Mixed solid cystic right mid thyroid nodule corresponds to the abnormality seen on CT and does not meet criteria for FNA or imaging follow-up. 2. Nodule 1 (TI-RADS 4), measuring 1.0 cm, located in the isthmus meets criteria for imaging follow-up. Annual ultrasound surveillance is recommended until 5 years of stability is documented. The above is in keeping with the ACR TI-RADS recommendations - J Am Coll Radiol 2017;14:587-595. Electronically Signed   By: Acquanetta Belling M.D.   On: 05/03/2023 08:25   CT CHEST ABDOMEN PELVIS W CONTRAST Result Date: 05/01/2023 CLINICAL DATA:  Sepsis EXAM: CT CHEST, ABDOMEN, AND PELVIS WITH CONTRAST TECHNIQUE: Multidetector CT imaging of the chest, abdomen and pelvis was performed following the standard protocol during bolus administration of intravenous contrast. RADIATION DOSE REDUCTION: This exam was performed according to the departmental dose-optimization program which includes automated exposure control, adjustment of the mA and/or kV according to patient size and/or use of iterative reconstruction technique. CONTRAST:  OMNIPAQUE IOHEXOL 300 MG/ML  SOLN COMPARISON:  01/24/2023, 11/13/2014, 09/05/2013 FINDINGS: CT CHEST FINDINGS Cardiovascular: Heart size is upper limits of normal. No pericardial effusion. Thoracic aorta is nonaneurysmal. Scattered aortic atherosclerosis. Central pulmonary vasculature is within normal limits. Mediastinum/Nodes: No axillary, mediastinal, or hilar lymphadenopathy. 2.6 cm low-attenuation nodule in the right thyroid  lobe. Trachea and esophagus demonstrate no significant abnormality. Lungs/Pleura: Postsurgical changes within the right upper lobe. Minor left basilar atelectasis. Lungs are otherwise clear. No pleural effusion or pneumothorax. Musculoskeletal: No chest wall mass or suspicious bone lesions identified. Exaggerated thoracic kyphosis. CT ABDOMEN PELVIS FINDINGS Hepatobiliary: Liver measures 22 cm in length. No focal liver abnormality is seen. No gallstones, gallbladder wall thickening, or biliary dilatation. Pancreas: Unremarkable. No pancreatic ductal dilatation or surrounding inflammatory changes. Spleen: Normal in size without focal abnormality. Adrenals/Urinary Tract: Unremarkable adrenal glands. Longstanding stable hyperattenuating lesion along the medial aspect of the left kidney measuring 2.0 cm which requires no dedicated follow-up imaging. Tiny nonobstructing left renal stone. Right kidney is unremarkable. No hydronephrosis. Urinary bladder within normal limits for the degree of distension. Stomach/Bowel: Moderate-large volume stool throughout the colon. Rectum is dilated with a large stool ball measuring 9 cm in diameter. Mild circumferential rectal wall thickening and mild fat stranding. Tiny hiatal hernia. No dilated loops of small bowel to suggest obstruction. Vascular/Lymphatic: Aortic atherosclerosis. No enlarged abdominal or pelvic lymph nodes. Reproductive: Atrophic uterus.  No adnexal masses. Other: No free fluid. No abdominopelvic fluid collection. No pneumoperitoneum. No abdominal wall hernia. Musculoskeletal: Incidentally noted 7 cm lipoma anterior to the left hip. No acute osseous abnormality. IMPRESSION: 1. Moderate-large volume stool throughout the colon. Rectum is dilated with a large stool ball measuring 9 cm in diameter. Mild circumferential rectal wall thickening and mild fat stranding, suggestive of stercoral colitis. 2. No acute intrathoracic findings. 3. Tiny nonobstructing left renal  stone. 4. A 2.6 cm low-attenuation nodule in the right thyroid lobe. Recommend thyroid US (ref: J  Am Coll Radiol. 2015 Feb;12(2): 143-50). 5. Hepatomegaly. 6. Aortic atherosclerosis (ICD10-I70.0). Electronically Signed   By: Duanne Guess D.O.   On: 05/01/2023 20:54   DG Chest Port 1 View Result Date: 05/01/2023 CLINICAL DATA:  Sepsis.  Constipation. EXAM: PORTABLE CHEST 1 VIEW COMPARISON:  X-ray 02/18/2023. FINDINGS: Kyphotic x-ray obscures the apices. Film is also rotated. Stable enlarged cardiopericardial silhouette with tortuous ectatic aorta. No pneumothorax or effusion. No edema. There is some linear opacity left lung base likely scar or atelectasis. Overlapping cardiac leads. Film is under penetrated. IMPRESSION: Limited radiograph.  Stable enlarged cardiopericardial silhouette. Left basilar scar or atelectasis.  No consolidation Electronically Signed   By: Karen Kays M.D.   On: 05/01/2023 18:11    Catarina Hartshorn, DO  [facility]  If 7PM-7AM, please contact night-coverage [email or web address] Password [email or web address] 05/03/2023, 3:09 PM   LOS: 2 days

## 2023-05-04 ENCOUNTER — Inpatient Hospital Stay (HOSPITAL_COMMUNITY): Payer: Medicare Other

## 2023-05-04 DIAGNOSIS — N3091 Cystitis, unspecified with hematuria: Secondary | ICD-10-CM | POA: Insufficient documentation

## 2023-05-04 DIAGNOSIS — B9620 Unspecified Escherichia coli [E. coli] as the cause of diseases classified elsewhere: Secondary | ICD-10-CM | POA: Diagnosis not present

## 2023-05-04 DIAGNOSIS — N390 Urinary tract infection, site not specified: Secondary | ICD-10-CM | POA: Diagnosis not present

## 2023-05-04 DIAGNOSIS — R7881 Bacteremia: Secondary | ICD-10-CM | POA: Diagnosis not present

## 2023-05-04 DIAGNOSIS — K5909 Other constipation: Secondary | ICD-10-CM

## 2023-05-04 DIAGNOSIS — A4151 Sepsis due to Escherichia coli [E. coli]: Secondary | ICD-10-CM | POA: Insufficient documentation

## 2023-05-04 LAB — CULTURE, BLOOD (ROUTINE X 2): Special Requests: ADEQUATE

## 2023-05-04 LAB — BASIC METABOLIC PANEL
Anion gap: 5 (ref 5–15)
BUN: 10 mg/dL (ref 8–23)
CO2: 28 mmol/L (ref 22–32)
Calcium: 9 mg/dL (ref 8.9–10.3)
Chloride: 105 mmol/L (ref 98–111)
Creatinine, Ser: 0.4 mg/dL — ABNORMAL LOW (ref 0.44–1.00)
GFR, Estimated: >60 mL/min (ref >60)
Glucose, Bld: 132 mg/dL — ABNORMAL HIGH (ref 70–99)
Potassium: 3.9 mmol/L (ref 3.5–5.1)
Sodium: 138 mmol/L (ref 135–145)

## 2023-05-04 LAB — GLUCOSE, CAPILLARY
Glucose-Capillary: 143 mg/dL — ABNORMAL HIGH (ref 70–99)
Glucose-Capillary: 124 mg/dL — ABNORMAL HIGH (ref 70–99)
Glucose-Capillary: 151 mg/dL — ABNORMAL HIGH (ref 70–99)

## 2023-05-04 LAB — PHOSPHORUS: Phosphorus: 3.3 mg/dL (ref 2.5–4.6)

## 2023-05-04 LAB — MAGNESIUM: Magnesium: 2.1 mg/dL (ref 1.7–2.4)

## 2023-05-04 MED ORDER — SODIUM CHLORIDE 0.9 % IV SOLN
1.0000 g | Freq: Three times a day (TID) | INTRAVENOUS | Status: AC
  Administered 2023-05-04 – 2023-05-07 (×10): 1 g via INTRAVENOUS
  Filled 2023-05-04 (×10): qty 20

## 2023-05-04 NOTE — Progress Notes (Signed)
Gastroenterology Progress Note   Referring Provider: No ref. provider found Primary Care Physician:  [name], [name] Per Primary Gastroenterologist:  Dr. Tasia Catchings  [name] ID: [name]; [id number]; [birth year]    Subjective   [name] reports significant improvement in abdominal pain.  She states she completed almost the entire bowel prep yesterday and she has had 3+ large bowel movements overnight and this morning.  Had recently just had a bowel movement and requesting help with hygiene.   Objective   Vital signs in last 24 hours Temp:  [97.7 F (36.5 C)-99.1 F (37.3 C)] 97.7 F (36.5 C) (12/20 0507) Pulse Rate:  [80-92] 80 (12/20 0507) Resp:  [16-24] 16 (12/20 0507) BP: (107-126)/(54-76) 107/62 (12/20 0507) SpO2:  [96 %-99 %] 96 % (12/20 0507) Last BM Date : 05/03/23  Physical Exam General:   Alert and oriented, pleasant Head:  Normocephalic and atraumatic. Eyes:  No icterus, sclera clear. Conjuctiva pink.  Mouth:  Without lesions, mucosa pink and moist.  Abdomen:  Bowel sounds present, soft, non-tender, non-distended. No HSM or hernias noted. No rebound or guarding. No masses appreciated  Psych:  Alert and cooperative. Normal mood and affect.  Intake/Output from previous day: 12/19 0701 - 12/20 0700 In: 120 [P.O.:120] Out: 600 [Urine:600] Intake/Output this shift: Total I/O In: -  Out: 500 [Urine:500]  Lab Results  Recent Labs    05/01/23 1750 05/02/23 0435  WBC 7.0 12.0*  HGB 14.7 13.0  HCT 45.6 41.0  PLT 195 197   BMET Recent Labs    05/01/23 1750 05/02/23 0435 05/04/23 0429  NA 138 137 138  K 3.8 3.9 3.9  CL 103 103 105  CO2 23 26 28   GLUCOSE 184* 144* 132*  BUN 15 13 10   CREATININE 0.54 0.46 0.40*  CALCIUM 9.7 9.1 9.0   LFT Recent Labs    05/01/23 1750  PROT 7.5  ALBUMIN 3.7  AST 18  ALT 28  ALKPHOS 104  BILITOT 0.7   PT/INR Recent Labs    05/01/23 1750  LABPROT 13.5  INR 1.0   Hepatitis Panel No results for input(s):  "HEPBSAG", "HCVAB", "HEPAIGM", "HEPBIGM" in the last 72 hours.  Studies/Results DG Abd 1 View Result Date: 05/04/2023 CLINICAL DATA:  Impacted stool in rectum. EXAM: ABDOMEN - 1 VIEW COMPARISON:  None Available. FINDINGS: The bowel gas pattern is normal. Mild amount of stool is seen in the sigmoid colon and rectum. No radio-opaque calculi or other significant radiographic abnormality are seen. IMPRESSION: Mild amount of stool is seen.  No abnormal bowel dilatation. Electronically Signed   By: Lupita Raider M.D.   On: 05/04/2023 11:07   US THYROID Result Date: 05/03/2023 CLINICAL DATA:  Right thyroid nodule EXAM: THYROID ULTRASOUND TECHNIQUE: Ultrasound examination of the thyroid gland and adjacent soft tissues was performed. COMPARISON:  Ultrasound thyroid 02/14/2022 CT chest abdomen pelvis 05/01/2023 FINDINGS: Parenchymal Echotexture: Mildly heterogenous Isthmus: 0.2 cm Right lobe: 5.2 x 2.5 x 3.2 cm Left lobe: 4.7 x 1.5 x 1.6 cm _________________________________________________________ Estimated total number of nodules >/= 1 cm: 2 Number of spongiform nodules >/=  2 cm not described below (TR1): 0 Number of mixed cystic and solid nodules >/= 1.5 cm not described below (TR2): 0 _________________________________________________________ Nodule # 1: Location: Isthmus; Mid Maximum size: 1.0 cm; Other 2 dimensions: 0.7 x 0.5 cm Composition: solid/almost completely solid (2) Echogenicity: hypoechoic (2) Shape: not taller-than-wide (0) Margins: smooth (0) Echogenic foci: none (0) ACR TI-RADS total points: 4. ACR  TI-RADS risk category: TR4 (4-6 points). ACR TI-RADS recommendations: *Given size (>/= 1 - 1.4 cm) and appearance, a follow-up ultrasound in 1 year should be considered based on TI-RADS criteria. _________________________________________________________ Nodule 2: 3.3 x 3.0 x 2.4 cm mixed solid cystic isoechoic right mid thyroid nodule (TI-RADS 2) does not meet criteria for imaging surveillance or FNA.  _________________________________________________________ Subcentimeter left thyroid nodules do not meet criteria for FNA or imaging follow-up. IMPRESSION: 1. Mixed solid cystic right mid thyroid nodule corresponds to the abnormality seen on CT and does not meet criteria for FNA or imaging follow-up. 2. Nodule 1 (TI-RADS 4), measuring 1.0 cm, located in the isthmus meets criteria for imaging follow-up. Annual ultrasound surveillance is recommended until 5 years of stability is documented. The above is in keeping with the ACR TI-RADS recommendations - J Am Coll Radiol 2017;14:587-595. Electronically Signed   By: Acquanetta Belling M.D.   On: 05/03/2023 08:25   CT CHEST ABDOMEN PELVIS W CONTRAST Result Date: 05/01/2023 CLINICAL DATA:  Sepsis EXAM: CT CHEST, ABDOMEN, AND PELVIS WITH CONTRAST TECHNIQUE: Multidetector CT imaging of the chest, abdomen and pelvis was performed following the standard protocol during bolus administration of intravenous contrast. RADIATION DOSE REDUCTION: This exam was performed according to the departmental dose-optimization program which includes automated exposure control, adjustment of the mA and/or kV according to [name] size and/or use of iterative reconstruction technique. CONTRAST:  OMNIPAQUE IOHEXOL 300 MG/ML  SOLN COMPARISON:  01/24/2023, 11/13/2014, 09/05/2013 FINDINGS: CT CHEST FINDINGS Cardiovascular: Heart size is upper limits of normal. No pericardial effusion. Thoracic aorta is nonaneurysmal. Scattered aortic atherosclerosis. Central pulmonary vasculature is within normal limits. Mediastinum/Nodes: No axillary, mediastinal, or hilar lymphadenopathy. 2.6 cm low-attenuation nodule in the right thyroid lobe. Trachea and esophagus demonstrate no significant abnormality. Lungs/Pleura: Postsurgical changes within the right upper lobe. Minor left basilar atelectasis. Lungs are otherwise clear. No pleural effusion or pneumothorax. Musculoskeletal: No chest wall mass or suspicious  bone lesions identified. Exaggerated thoracic kyphosis. CT ABDOMEN PELVIS FINDINGS Hepatobiliary: Liver measures 22 cm in length. No focal liver abnormality is seen. No gallstones, gallbladder wall thickening, or biliary dilatation. Pancreas: Unremarkable. No pancreatic ductal dilatation or surrounding inflammatory changes. Spleen: Normal in size without focal abnormality. Adrenals/Urinary Tract: Unremarkable adrenal glands. Longstanding stable hyperattenuating lesion along the medial aspect of the left kidney measuring 2.0 cm which requires no dedicated follow-up imaging. Tiny nonobstructing left renal stone. Right kidney is unremarkable. No hydronephrosis. Urinary bladder within normal limits for the degree of distension. Stomach/Bowel: Moderate-large volume stool throughout the colon. Rectum is dilated with a large stool ball measuring 9 cm in diameter. Mild circumferential rectal wall thickening and mild fat stranding. Tiny hiatal hernia. No dilated loops of small bowel to suggest obstruction. Vascular/Lymphatic: Aortic atherosclerosis. No enlarged abdominal or pelvic lymph nodes. Reproductive: Atrophic uterus.  No adnexal masses. Other: No free fluid. No abdominopelvic fluid collection. No pneumoperitoneum. No abdominal wall hernia. Musculoskeletal: Incidentally noted 7 cm lipoma anterior to the left hip. No acute osseous abnormality. IMPRESSION: 1. Moderate-large volume stool throughout the colon. Rectum is dilated with a large stool ball measuring 9 cm in diameter. Mild circumferential rectal wall thickening and mild fat stranding, suggestive of stercoral colitis. 2. No acute intrathoracic findings. 3. Tiny nonobstructing left renal stone. 4. A 2.6 cm low-attenuation nodule in the right thyroid lobe. Recommend thyroid US (ref: J Am Coll Radiol. 2015 Feb;12(2): 143-50). 5. Hepatomegaly. 6. Aortic atherosclerosis (ICD10-I70.0). Electronically Signed   By: Duanne Guess D.O.   On:  05/01/2023 20:54   DG  Chest Port 1 View Result Date: 05/01/2023 CLINICAL DATA:  Sepsis.  Constipation. EXAM: PORTABLE CHEST 1 VIEW COMPARISON:  X-ray 02/18/2023. FINDINGS: Kyphotic x-ray obscures the apices. Film is also rotated. Stable enlarged cardiopericardial silhouette with tortuous ectatic aorta. No pneumothorax or effusion. No edema. There is some linear opacity left lung base likely scar or atelectasis. Overlapping cardiac leads. Film is under penetrated. IMPRESSION: Limited radiograph.  Stable enlarged cardiopericardial silhouette. Left basilar scar or atelectasis.  No consolidation Electronically Signed   By: Karen Kays M.D.   On: 05/01/2023 18:11    Assessment  67 y.o. female with a history of chronic pain on ATC narcotics, bipolar disorder, substance abuse, GERD, lung cancer, frequent ESBL UTIs, HLD, DJD, type 2 diabetes, DVT on chronic anticoagulation who presented from [facility] 05/01/2023 with reported episode of syncope during defecation.  GI consulted given stercoral colitis/fecal impaction noted on imaging.  Constipation/fecal impaction/stercoral colitis: Chronic constipation likely in the setting of chronic opioid use and lack of mobility.  At baseline has about 1 bowel movement per week at best.  Her bowel regimen at [facility] includes Senokot twice daily.  CT this admission with moderate to large volume stool throughout the colon with dilated rectum with large stool ball measuring about 9 cm.  She also had mild circumferential rectal wall thickening and fat stranding suggestive of stercoral colitis.  DRE this admission without palpable mass and only soft stool within the rectal vault.  She was given milk of molasses enema and started on MiraLAX twice daily as well as Linzess 290 mcg once daily.  She had slight response to enema however was given a bowel prep last night which she completed and has been having repeated bowel movements today.  She reports significant improvement to abdominal distention and pain and  overall feeling much better today and would like to discharge if possible.  Given the degree of constipation we will continue MiraLAX twice daily as well as Linzess and can reduce MiraLAX if she has significant diarrhea after being on bowel regimen for 2 weeks.  We will see her for follow-up outpatient to discuss colonoscopy given she has never had one for screening purposes and to evaluate her rectal wall thickening on CT.   Plan / Recommendations  Continue Linzess 290 mcg daily Continue MiraLAX 17 g twice daily.  If she has significant diarrhea daily even after 1/3 then can DC MiraLAX but continue Linzess. If this regimen is not able to control her constipation outpatient, may need to consider Movantik. Will need outpatient colonoscopy, plans to discuss this further at follow-up. Follow-up in the office in 2-3 weeks  [name] okay for discharge from a GI standpoint.  Follow-up being arranged.    LOS: 3 days    05/04/2023, 11:24 AM   Brooke Bonito, MSN, FNP-BC, AGACNP-BC [facility]

## 2023-05-04 NOTE — Care Management Important Message (Signed)
Important Message  Patient Details  Name: [name] MRN: [id number] Date of Birth: 1955-12-31   Important Message Given:  Yes - Medicare IM (spoke with Ms. Willet by phone to review letter, no additonal copy needed)     Corey Harold 05/04/2023, 11:51 AM

## 2023-05-04 NOTE — Plan of Care (Signed)

## 2023-05-04 NOTE — Progress Notes (Signed)
PROGRESS NOTE  [name] YQM:[id number] DOB: December 10, 1955 DOA: 05/01/2023 PCP: Patient, [name] Per  Brief History:  67 y.o. female with a PMH significant for frequent ESBL UTIs, HLD, severe bipolar 1 disorder, lung cancer, DJD, MDD, acute urinary retention, type 2 diabetes mellitus, DVT.  At baseline, they live at a nursing facility and are dependent for other ADLs.  She is bedbound.   They presented from nursing facility to the ED on 05/01/2023 with reported episode of syncope while having a bowel movement.  Patient denies this episode.  Her only complaint today is pain in her right foot.  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  She requests pain medication oral and topical.  She also requests a trachea which.  Discussed patient's laboratory results of having a urinary tract infection and she states that she has difficulty initiating her urine stream and has to strain but this is not new.  She endorses suprapubic pain.  Endorses history of ESBL UTIs.  States that she has had chills and felt febrile today.  Denies nausea, vomiting, diarrhea.     They were initially treated with meropenem. Urine cultures pending.    Patient was admitted to medicine service for further workup and management of urosepsis as outlined in detail below.   Assessment/Plan: Severe Sepsis secondary to ESBL E coli UTI  - present on admission - continue meropenem  - blood cultures  = E coli - urinary retention most likely from severe constipation (opioid induced)   E coli  bacteremia -continue merrem -no good po alternatives -plan at least 7 days IV merrem   Constipation Stercoral colitis  - continue miralax BID, plus nightly senna - molasses enema ordered 12/18 and 12/19 - s/p GoLytely>>numerous BMs -12/20 KUB--no bowel dilatation; mild residual stool - appreciate GI eval - started Linzess   Urinary Retention  - continue flomax   Chronic pain / opioid dependence  - she is back on home  pain management regimen -she is on oxycodone 10 mg>>continue -continue gabapentin   Uncontrolled DM2 with hyperglycemia -continue Semglee 10 units daily -continue novolog sliding scale -CBGs controlled   History of DVT  - continue apixaban    Hyperthyroidism  - home methimazole resumed   Right thyroid nodule  - US thyroid>>mixed solid/cystic R-midthyroid nodule does not meet criteria for FNA or imaging;  1.0 cm isthmus nodule--annual survelance   Bipolar and MDD - resumed home medication    Hx of DVT -continue apixaban     Family Communication: no  Family at bedside   Consultants:  GI   Code Status:  DNR   DVT Prophylaxis:  apixaban     Procedures: As Listed in Progress Note Above   Antibiotics: Merrem 12/17>>         Subjective: Patient denies fevers, chills, headache, chest pain, dyspnea, nausea, vomiting, diarrhea, abdominal pain, dysuria, hematuria, hematochezia, and melena.   Objective: Vitals:   05/03/23 1238 05/03/23 2100 05/04/23 0507 05/04/23 1421  BP: (!) 126/54 124/76 107/62 (!) 109/57  Pulse: 85 92 80 94  Resp: (!) 24 18 16 18   Temp: 97.7 F (36.5 C) 99.1 F (37.3 C) 97.7 F (36.5 C) 98.4 F (36.9 C)  TempSrc:  Oral Oral Oral  SpO2: 99% 97% 96% 97%  Weight:      Height:        Intake/Output Summary (Last 24 hours) at 05/04/2023 1723 Last data filed at 05/04/2023  1128 Gross per 24 hour  Intake 120 ml  Output 1100 ml  Net -980 ml   Weight change:  Exam:  General:  Pt is alert, follows commands appropriately, not in acute distress HEENT: No icterus, No thrush, No neck mass, [place]/AT Cardiovascular: RRR, S1/S2, no rubs, no gallops Respiratory: CTA bilaterally, no wheezing, no crackles, no rhonchi Abdomen: Soft/+BS, non tender, non distended, no guarding Extremities: No edema, No lymphangitis, No petechiae, No rashes, no synovitis   Data Reviewed: I have personally reviewed following labs and imaging studies Basic Metabolic  Panel: Recent Labs  Lab 05/01/23 1750 05/02/23 0435 05/04/23 0429  NA 138 137 138  K 3.8 3.9 3.9  CL 103 103 105  CO2 23 26 28   GLUCOSE 184* 144* 132*  BUN 15 13 10   CREATININE 0.54 0.46 0.40*  CALCIUM 9.7 9.1 9.0  MG  --   --  2.1  PHOS  --   --  3.3   Liver Function Tests: Recent Labs  Lab 05/01/23 1750  AST 18  ALT 28  ALKPHOS 104  BILITOT 0.7  PROT 7.5  ALBUMIN 3.7   No results for input(s): "LIPASE", "AMYLASE" in the last 168 hours. No results for input(s): "AMMONIA" in the last 168 hours. Coagulation Profile: Recent Labs  Lab 05/01/23 1750  INR 1.0   CBC: Recent Labs  Lab 05/01/23 1750 05/02/23 0435  WBC 7.0 12.0*  NEUTROABS 6.5  --   HGB 14.7 13.0  HCT 45.6 41.0  MCV 93.4 93.2  PLT 195 197   Cardiac Enzymes: No results for input(s): "CKTOTAL", "CKMB", "CKMBINDEX", "TROPONINI" in the last 168 hours. BNP: Invalid input(s): "POCBNP" CBG: Recent Labs  Lab 05/03/23 1108 05/03/23 1657 05/04/23 0733 05/04/23 1157 05/04/23 1629  GLUCAP 149* 140* 143* 124* 151*   HbA1C: No results for input(s): "HGBA1C" in the last 72 hours. Urine analysis:    Component Value Date/Time   COLORURINE YELLOW 05/01/2023 1700   APPEARANCEUR HAZY (A) 05/01/2023 1700   LABSPEC 1.012 05/01/2023 1700   PHURINE 6.0 05/01/2023 1700   GLUCOSEU NEGATIVE 05/01/2023 1700   HGBUR SMALL (A) 05/01/2023 1700   BILIRUBINUR NEGATIVE 05/01/2023 1700   KETONESUR NEGATIVE 05/01/2023 1700   PROTEINUR 30 (A) 05/01/2023 1700   UROBILINOGEN 0.2 03/11/2010 1057   NITRITE NEGATIVE 05/01/2023 1700   LEUKOCYTESUR LARGE (A) 05/01/2023 1700   Sepsis Labs: @LABRCNTIP (procalcitonin:4,lacticidven:4) ) Recent Results (from the past 240 hours)  Urine Culture     Status: Abnormal   Collection Time: 05/01/23  5:00 PM   Specimen: Urine, Random  Result Value Ref Range Status   Specimen Description   Final    URINE, RANDOM Performed at [facility], [address], [place], [place]  [address]    Special Requests   Final    NONE Reflexed from [id number] Performed at [facility], [address] [place], [place] [address]    Culture (A)  Final    >=100,000 COLONIES/mL ESCHERICHIA COLI Confirmed Extended Spectrum Beta-Lactamase Producer (ESBL).  In bloodstream infections from ESBL organisms, carbapenems are preferred over piperacillin/tazobactam. They are shown to have a lower risk of mortality.    Report Status 05/03/2023 FINAL  Final   Organism ID, Bacteria ESCHERICHIA COLI (A)  Final      Susceptibility   Escherichia coli - MIC*    AMPICILLIN >=32 RESISTANT Resistant     CEFAZOLIN >=64 RESISTANT Resistant     CEFEPIME 16 RESISTANT Resistant     CEFTRIAXONE >=64 RESISTANT Resistant  CIPROFLOXACIN >=4 RESISTANT Resistant     GENTAMICIN <=1 SENSITIVE Sensitive     IMIPENEM <=0.25 SENSITIVE Sensitive     NITROFURANTOIN <=16 SENSITIVE Sensitive     TRIMETH/SULFA <=20 SENSITIVE Sensitive     AMPICILLIN/SULBACTAM >=32 RESISTANT Resistant     PIP/TAZO 8 SENSITIVE Sensitive ug/mL    * >=100,000 COLONIES/mL ESCHERICHIA COLI  Blood Culture (routine x 2)     Status: Abnormal   Collection Time: 05/01/23  5:50 PM   Specimen: BLOOD RIGHT ARM  Result Value Ref Range Status   Specimen Description   Final    BLOOD RIGHT ARM Performed at [facility], [address], [place], [place] [address]    Special Requests   Final    BOTTLES DRAWN AEROBIC AND ANAEROBIC Blood Culture adequate volume Performed at [facility], [address] [place], [place] [address]    Culture  Setup Time   Final    GRAM NEGATIVE RODS IN BOTH AEROBIC AND ANAEROBIC BOTTLES CRITICAL RESULT CALLED TO, READ BACK BY AND VERIFIED WITH: [facility] [place] AT 8295 05/02/2023 BY T KENNEDY Organism ID to follow Performed at [facility], [address] [place], [place] [address]    Culture (A)  Final    ESCHERICHIA COLI Confirmed Extended Spectrum Beta-Lactamase Producer (ESBL).  In bloodstream  infections from ESBL organisms, carbapenems are preferred over piperacillin/tazobactam. They are shown to have a lower risk of mortality.    Report Status 05/04/2023 FINAL  Final   Organism ID, Bacteria ESCHERICHIA COLI  Final      Susceptibility   Escherichia coli - MIC*    AMPICILLIN >=32 RESISTANT Resistant     CEFEPIME 16 RESISTANT Resistant     CEFTAZIDIME RESISTANT Resistant     CEFTRIAXONE >=64 RESISTANT Resistant     CIPROFLOXACIN >=4 RESISTANT Resistant     GENTAMICIN <=1 SENSITIVE Sensitive     IMIPENEM <=0.25 SENSITIVE Sensitive     TRIMETH/SULFA <=20 SENSITIVE Sensitive     AMPICILLIN/SULBACTAM >=32 RESISTANT Resistant     PIP/TAZO 8 SENSITIVE Sensitive ug/mL    * ESCHERICHIA COLI  Blood Culture (routine x 2)     Status: None (Preliminary result)   Collection Time: 05/01/23  5:50 PM   Specimen: BLOOD  Result Value Ref Range Status   Specimen Description BLOOD BLOOD LEFT ARM  Final   Special Requests   Final    BOTTLES DRAWN AEROBIC AND ANAEROBIC Blood Culture adequate volume   Culture   Final    NO GROWTH 3 DAYS Performed at [facility], [address], [place], [place] [address]    Report Status PENDING  Incomplete  Blood Culture ID Panel (Reflexed)     Status: Abnormal   Collection Time: 05/01/23  5:50 PM  Result Value Ref Range Status   Enterococcus faecalis NOT DETECTED NOT DETECTED Final   Enterococcus Faecium NOT DETECTED NOT DETECTED Final   Listeria monocytogenes NOT DETECTED NOT DETECTED Final   Staphylococcus species NOT DETECTED NOT DETECTED Final   Staphylococcus aureus (BCID) NOT DETECTED NOT DETECTED Final   Staphylococcus epidermidis NOT DETECTED NOT DETECTED Final   Staphylococcus lugdunensis NOT DETECTED NOT DETECTED Final   Streptococcus species NOT DETECTED NOT DETECTED Final   Streptococcus agalactiae NOT DETECTED NOT DETECTED Final   Streptococcus pneumoniae NOT DETECTED NOT DETECTED Final   Streptococcus pyogenes NOT DETECTED NOT DETECTED  Final   A.calcoaceticus-baumannii NOT DETECTED NOT DETECTED Final   Bacteroides fragilis NOT DETECTED NOT DETECTED Final  Enterobacterales DETECTED (A) NOT DETECTED Final    Comment: Enterobacterales represent a large order of gram negative bacteria, not a single organism. CRITICAL RESULT CALLED TO, READ BACK BY AND VERIFIED WITH: F. Andrey Campanile PHARMD, AT 1255 05/02/23 D. VANHOOK    Enterobacter cloacae complex NOT DETECTED NOT DETECTED Final   Escherichia coli DETECTED (A) NOT DETECTED Final    Comment: CRITICAL RESULT CALLED TO, READ BACK BY AND VERIFIED WITH: F. Andrey Campanile PHARMD, AT 1255 05/02/23 D. VANHOOK    Klebsiella aerogenes NOT DETECTED NOT DETECTED Final   Klebsiella oxytoca NOT DETECTED NOT DETECTED Final   Klebsiella pneumoniae NOT DETECTED NOT DETECTED Final   Proteus species NOT DETECTED NOT DETECTED Final   Salmonella species NOT DETECTED NOT DETECTED Final   Serratia marcescens NOT DETECTED NOT DETECTED Final   Haemophilus influenzae NOT DETECTED NOT DETECTED Final   Neisseria meningitidis NOT DETECTED NOT DETECTED Final   Pseudomonas aeruginosa NOT DETECTED NOT DETECTED Final   Stenotrophomonas maltophilia NOT DETECTED NOT DETECTED Final   Candida albicans NOT DETECTED NOT DETECTED Final   Candida auris NOT DETECTED NOT DETECTED Final   Candida glabrata NOT DETECTED NOT DETECTED Final   Candida krusei NOT DETECTED NOT DETECTED Final   Candida parapsilosis NOT DETECTED NOT DETECTED Final   Candida tropicalis NOT DETECTED NOT DETECTED Final   Cryptococcus neoformans/gattii NOT DETECTED NOT DETECTED Final   CTX-M ESBL DETECTED (A) NOT DETECTED Final    Comment: CRITICAL RESULT CALLED TO, READ BACK BY AND VERIFIED WITH: FAndrey Campanile PHARMD, AT 1255 05/02/23 D. VANHOOK (NOTE) Extended spectrum beta-lactamase detected. Recommend a carbapenem as initial therapy.      Carbapenem resistance IMP NOT DETECTED NOT DETECTED Final   Carbapenem resistance KPC NOT DETECTED NOT  DETECTED Final   Carbapenem resistance NDM NOT DETECTED NOT DETECTED Final   Carbapenem resist OXA 48 LIKE NOT DETECTED NOT DETECTED Final   Carbapenem resistance VIM NOT DETECTED NOT DETECTED Final    Comment: Performed at [facility], [address], [place], [place] [address]  MRSA Next Gen by PCR, Nasal     Status: Abnormal   Collection Time: 05/01/23  9:49 PM   Specimen: Nasal Mucosa; Nasal Swab  Result Value Ref Range Status   MRSA by PCR Next Gen DETECTED (A) NOT DETECTED Final    Comment: CRITICAL RESULT CALLED TO, READ BACK BY AND VERIFIED WITH: J. HARTFORD AT 0111 ON 12.18.24 BY ADGER J         The GeneXpert MRSA Assay (FDA approved for NASAL specimens only), is one component of a comprehensive MRSA colonization surveillance program. It is not intended to diagnose MRSA infection nor to guide or monitor treatment for MRSA infections. Performed at [facility], [address] [place], [place] [address]      Scheduled Meds:  apixaban  5 mg Oral BID   Chlorhexidine Gluconate Cloth  6 each Topical Q0600   diclofenac Sodium  2 g Topical QID   folic acid  1 mg Oral Daily   gabapentin  300 mg Oral TID   insulin aspart  0-9 Units Subcutaneous TID WC   insulin glargine-yfgn  10 Units Subcutaneous Daily   lidocaine  1 patch Transdermal Q24H   [START ON 05/05/2023] linaclotide  290 mcg Oral QAC breakfast   lithium carbonate  300 mg Oral QHS   lithium carbonate  600 mg Oral Daily   loratadine  10 mg Oral Daily   melatonin  9 mg Oral QHS  methimazole  5 mg Oral BID   mupirocin ointment  1 Application Nasal BID   polyethylene glycol  17 g Oral BID   QUEtiapine  100 mg Oral QHS   QUEtiapine  25 mg Oral Daily   saccharomyces boulardii  250 mg Oral Daily   tamsulosin  0.4 mg Oral Daily   Continuous Infusions:  meropenem (MERREM) IV 1 g (05/04/23 1411)    Procedures/Studies: DG Abd 1 View Result Date: 05/04/2023 CLINICAL DATA:  Impacted stool in rectum. EXAM:  ABDOMEN - 1 VIEW COMPARISON:  None Available. FINDINGS: The bowel gas pattern is normal. Mild amount of stool is seen in the sigmoid colon and rectum. No radio-opaque calculi or other significant radiographic abnormality are seen. IMPRESSION: Mild amount of stool is seen.  No abnormal bowel dilatation. Electronically Signed   By: Lupita Raider M.D.   On: 05/04/2023 11:07   US THYROID Result Date: 05/03/2023 CLINICAL DATA:  Right thyroid nodule EXAM: THYROID ULTRASOUND TECHNIQUE: Ultrasound examination of the thyroid gland and adjacent soft tissues was performed. COMPARISON:  Ultrasound thyroid 02/14/2022 CT chest abdomen pelvis 05/01/2023 FINDINGS: Parenchymal Echotexture: Mildly heterogenous Isthmus: 0.2 cm Right lobe: 5.2 x 2.5 x 3.2 cm Left lobe: 4.7 x 1.5 x 1.6 cm _________________________________________________________ Estimated total number of nodules >/= 1 cm: 2 Number of spongiform nodules >/=  2 cm not described below (TR1): 0 Number of mixed cystic and solid nodules >/= 1.5 cm not described below (TR2): 0 _________________________________________________________ Nodule # 1: Location: Isthmus; Mid Maximum size: 1.0 cm; Other 2 dimensions: 0.7 x 0.5 cm Composition: solid/almost completely solid (2) Echogenicity: hypoechoic (2) Shape: not taller-than-wide (0) Margins: smooth (0) Echogenic foci: none (0) ACR TI-RADS total points: 4. ACR TI-RADS risk category: TR4 (4-6 points). ACR TI-RADS recommendations: *Given size (>/= 1 - 1.4 cm) and appearance, a follow-up ultrasound in 1 year should be considered based on TI-RADS criteria. _________________________________________________________ Nodule 2: 3.3 x 3.0 x 2.4 cm mixed solid cystic isoechoic right mid thyroid nodule (TI-RADS 2) does not meet criteria for imaging surveillance or FNA. _________________________________________________________ Subcentimeter left thyroid nodules do not meet criteria for FNA or imaging follow-up. IMPRESSION: 1. Mixed solid  cystic right mid thyroid nodule corresponds to the abnormality seen on CT and does not meet criteria for FNA or imaging follow-up. 2. Nodule 1 (TI-RADS 4), measuring 1.0 cm, located in the isthmus meets criteria for imaging follow-up. Annual ultrasound surveillance is recommended until 5 years of stability is documented. The above is in keeping with the ACR TI-RADS recommendations - J Am Coll Radiol 2017;14:587-595. Electronically Signed   By: Acquanetta Belling M.D.   On: 05/03/2023 08:25   CT CHEST ABDOMEN PELVIS W CONTRAST Result Date: 05/01/2023 CLINICAL DATA:  Sepsis EXAM: CT CHEST, ABDOMEN, AND PELVIS WITH CONTRAST TECHNIQUE: Multidetector CT imaging of the chest, abdomen and pelvis was performed following the standard protocol during bolus administration of intravenous contrast. RADIATION DOSE REDUCTION: This exam was performed according to the departmental dose-optimization program which includes automated exposure control, adjustment of the mA and/or kV according to patient size and/or use of iterative reconstruction technique. CONTRAST:  OMNIPAQUE IOHEXOL 300 MG/ML  SOLN COMPARISON:  01/24/2023, 11/13/2014, 09/05/2013 FINDINGS: CT CHEST FINDINGS Cardiovascular: Heart size is upper limits of normal. No pericardial effusion. Thoracic aorta is nonaneurysmal. Scattered aortic atherosclerosis. Central pulmonary vasculature is within normal limits. Mediastinum/Nodes: No axillary, mediastinal, or hilar lymphadenopathy. 2.6 cm low-attenuation nodule in the right thyroid lobe. Trachea and esophagus demonstrate no significant  abnormality. Lungs/Pleura: Postsurgical changes within the right upper lobe. Minor left basilar atelectasis. Lungs are otherwise clear. No pleural effusion or pneumothorax. Musculoskeletal: No chest wall mass or suspicious bone lesions identified. Exaggerated thoracic kyphosis. CT ABDOMEN PELVIS FINDINGS Hepatobiliary: Liver measures 22 cm in length. No focal liver abnormality is seen. No  gallstones, gallbladder wall thickening, or biliary dilatation. Pancreas: Unremarkable. No pancreatic ductal dilatation or surrounding inflammatory changes. Spleen: Normal in size without focal abnormality. Adrenals/Urinary Tract: Unremarkable adrenal glands. Longstanding stable hyperattenuating lesion along the medial aspect of the left kidney measuring 2.0 cm which requires no dedicated follow-up imaging. Tiny nonobstructing left renal stone. Right kidney is unremarkable. No hydronephrosis. Urinary bladder within normal limits for the degree of distension. Stomach/Bowel: Moderate-large volume stool throughout the colon. Rectum is dilated with a large stool ball measuring 9 cm in diameter. Mild circumferential rectal wall thickening and mild fat stranding. Tiny hiatal hernia. No dilated loops of small bowel to suggest obstruction. Vascular/Lymphatic: Aortic atherosclerosis. No enlarged abdominal or pelvic lymph nodes. Reproductive: Atrophic uterus.  No adnexal masses. Other: No free fluid. No abdominopelvic fluid collection. No pneumoperitoneum. No abdominal wall hernia. Musculoskeletal: Incidentally noted 7 cm lipoma anterior to the left hip. No acute osseous abnormality. IMPRESSION: 1. Moderate-large volume stool throughout the colon. Rectum is dilated with a large stool ball measuring 9 cm in diameter. Mild circumferential rectal wall thickening and mild fat stranding, suggestive of stercoral colitis. 2. No acute intrathoracic findings. 3. Tiny nonobstructing left renal stone. 4. A 2.6 cm low-attenuation nodule in the right thyroid lobe. Recommend thyroid US (ref: J Am Coll Radiol. 2015 Feb;12(2): 143-50). 5. Hepatomegaly. 6. Aortic atherosclerosis (ICD10-I70.0). Electronically Signed   By: Duanne Guess D.O.   On: 05/01/2023 20:54   DG Chest Port 1 View Result Date: 05/01/2023 CLINICAL DATA:  Sepsis.  Constipation. EXAM: PORTABLE CHEST 1 VIEW COMPARISON:  X-ray 02/18/2023. FINDINGS: Kyphotic x-ray  obscures the apices. Film is also rotated. Stable enlarged cardiopericardial silhouette with tortuous ectatic aorta. No pneumothorax or effusion. No edema. There is some linear opacity left lung base likely scar or atelectasis. Overlapping cardiac leads. Film is under penetrated. IMPRESSION: Limited radiograph.  Stable enlarged cardiopericardial silhouette. Left basilar scar or atelectasis.  No consolidation Electronically Signed   By: Karen Kays M.D.   On: 05/01/2023 18:11    Catarina Hartshorn, DO  [facility]  If 7PM-7AM, please contact night-coverage [email or web address] Password [email or web address] 05/04/2023, 5:23 PM   LOS: 3 days

## 2023-05-04 NOTE — TOC Progression Note (Signed)
Transition of Care [facility]) - Progression Note    Patient Details  Name: MALIKA GREIFF MRN: [id number] Date of Birth: 12/25/1955  Transition of Care [facility]) CM/SW Contact  Elliot Gault, LCSW Phone Number: 05/04/2023, 12:38 PM  Clinical Narrative:     TOC following. Per MD, pt not yet medically stable for dc. He is anticipating dc Monday.   Updated Debbie at [facility]. TOC will follow.  Expected Discharge Plan: Long Term Nursing Home Barriers to Discharge: Continued Medical Work up  Expected Discharge Plan and Services In-house Referral: Clinical Social Work   Post Acute Care Choice: Skilled Nursing Facility Living arrangements for the past 2 months: Skilled Nursing Facility                                       Social Determinants of Health (SDOH) Interventions SDOH Screenings   Food Insecurity: No Food Insecurity (05/01/2023)  Housing: Low Risk  (05/01/2023)  Transportation Needs: No Transportation Needs (05/01/2023)  Utilities: Not At Risk (05/01/2023)  Alcohol Screen: Low Risk  (11/14/2017)  Social Connections: Unknown (07/05/2021)   [facility]  Tobacco Use: Medium Risk (05/01/2023)    Readmission Risk Interventions    05/02/2023    8:07 AM 02/14/2023    9:57 AM 08/03/2021    1:25 PM  Readmission Risk Prevention Plan  Transportation Screening Complete Complete Complete  PCP or Specialist Appt within 5-7 Days   Complete  Home Care Screening   Complete  Medication Review (RN CM)   Complete  Medication Review Oceanographer) Complete Complete   HRI or Home Care Consult Complete Complete   SW Recovery Care/Counseling Consult Complete Complete   Palliative Care Screening Not Applicable Not Applicable   Skilled Nursing Facility Complete Complete

## 2023-05-05 DIAGNOSIS — N3091 Cystitis, unspecified with hematuria: Secondary | ICD-10-CM

## 2023-05-05 DIAGNOSIS — A419 Sepsis, unspecified organism: Secondary | ICD-10-CM | POA: Diagnosis not present

## 2023-05-05 DIAGNOSIS — B9620 Unspecified Escherichia coli [E. coli] as the cause of diseases classified elsewhere: Secondary | ICD-10-CM | POA: Diagnosis not present

## 2023-05-05 LAB — GLUCOSE, CAPILLARY
Glucose-Capillary: 131 mg/dL — ABNORMAL HIGH (ref 70–99)
Glucose-Capillary: 128 mg/dL — ABNORMAL HIGH (ref 70–99)
Glucose-Capillary: 118 mg/dL — ABNORMAL HIGH (ref 70–99)
Glucose-Capillary: 163 mg/dL — ABNORMAL HIGH (ref 70–99)

## 2023-05-05 NOTE — Progress Notes (Addendum)
PROGRESS NOTE  CYANI SPACE ZOX:[id number] DOB: 01-May-1956 DOA: 05/01/2023 PCP: Patient, [name] Per  Brief History:  67 y.o. female with a PMH significant for frequent ESBL UTIs, HLD, severe bipolar 1 disorder, lung cancer, DJD, MDD, acute urinary retention, type 2 diabetes mellitus, DVT.  At baseline, they live at a nursing facility and are dependent for other ADLs.  She is bedbound.   They presented from nursing facility to the ED on 05/01/2023 with reported episode of syncope while having a bowel movement.  Patient denies this episode.  Her only complaint today is pain in her right foot.  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  She requests pain medication oral and topical.  She also requests a trachea which.  Discussed patient's laboratory results of having a urinary tract infection and she states that she has difficulty initiating her urine stream and has to strain but this is not new.  She endorses suprapubic pain.  Endorses history of ESBL UTIs.  States that she has had chills and felt febrile today.  Denies nausea, vomiting, diarrhea.     They were initially treated with meropenem. Urine cultures pending.    Patient was admitted to medicine service for further workup and management of urosepsis as outlined in detail below.   Assessment/Plan: Severe Sepsis secondary to ESBL E coli UTI  - present on admission - continue meropenem  - blood cultures  = E coli - urinary retention most likely from severe constipation (opioid induced) -sepsis physiology resolved   E coli  bacteremia -continue merrem -no good po alternatives -plan at least 7 days IV merrem   Constipation Stercoral colitis  - continue miralax BID, plus nightly senna - molasses enema ordered 12/18 and 12/19 - s/p GoLytely>>numerous BMs -12/20 KUB--no bowel dilatation; mild residual stool - appreciate GI eval - started Linzess -continues to have daily soft BMs with bowel regimen   Urinary Retention   - continue flomax   Chronic pain / opioid dependence  - she is back on home pain management regimen -she is on oxycodone 10 mg>>continue -continue gabapentin   Uncontrolled DM2 with hyperglycemia -continue Semglee 10 units daily -continue novolog sliding scale -CBGs controlled during hospitalization -02/14/23 A1C-- 10.9   History of DVT  - continue apixaban    Hyperthyroidism  - home methimazole resumed   Right thyroid nodule  - US thyroid>>mixed solid/cystic R-midthyroid nodule does not meet criteria for FNA or imaging;  1.0 cm isthmus nodule--annual survelance   Bipolar and MDD - resumed home medication    Hx of DVT -continue apixaban     Family Communication: no  Family at bedside   Consultants:  GI   Code Status:  DNR   DVT Prophylaxis:  apixaban     Procedures: As Listed in Progress Note Above   Antibiotics: Merrem 12/17>>             Subjective: Pt had a soft BM today without blood.  Denies f/c, cp, sob, abd pain, n/v/d  Objective: Vitals:   05/04/23 1421 05/04/23 1951 05/05/23 0549 05/05/23 1410  BP: (!) 109/57 115/83 134/89 133/73  Pulse: 94 95 78 94  Resp: 18 20 20 17   Temp: 98.4 F (36.9 C) (!) 97.3 F (36.3 C) 98 F (36.7 C) 98 F (36.7 C)  TempSrc: Oral     SpO2: 97% 96% 98% 95%  Weight:      Height:  Intake/Output Summary (Last 24 hours) at 05/05/2023 1729 Last data filed at 05/05/2023 1300 Gross per 24 hour  Intake 573.48 ml  Output 2600 ml  Net -2026.52 ml   Weight change:  Exam:  General:  Pt is alert, follows commands appropriately, not in acute distress HEENT: No icterus, No thrush, No neck mass, [place]/AT Cardiovascular: RRR, S1/S2, no rubs, no gallops Respiratory: CTA bilaterally, no wheezing, no crackles, no rhonchi Abdomen: Soft/+BS, non tender, non distended, no guarding Extremities: No edema, No lymphangitis, No petechiae, No rashes, no synovitis   Data Reviewed: I have personally reviewed  following labs and imaging studies Basic Metabolic Panel: Recent Labs  Lab 05/01/23 1750 05/02/23 0435 05/04/23 0429  NA 138 137 138  K 3.8 3.9 3.9  CL 103 103 105  CO2 23 26 28   GLUCOSE 184* 144* 132*  BUN 15 13 10   CREATININE 0.54 0.46 0.40*  CALCIUM 9.7 9.1 9.0  MG  --   --  2.1  PHOS  --   --  3.3   Liver Function Tests: Recent Labs  Lab 05/01/23 1750  AST 18  ALT 28  ALKPHOS 104  BILITOT 0.7  PROT 7.5  ALBUMIN 3.7   No results for input(s): "LIPASE", "AMYLASE" in the last 168 hours. No results for input(s): "AMMONIA" in the last 168 hours. Coagulation Profile: Recent Labs  Lab 05/01/23 1750  INR 1.0   CBC: Recent Labs  Lab 05/01/23 1750 05/02/23 0435  WBC 7.0 12.0*  NEUTROABS 6.5  --   HGB 14.7 13.0  HCT 45.6 41.0  MCV 93.4 93.2  PLT 195 197   Cardiac Enzymes: No results for input(s): "CKTOTAL", "CKMB", "CKMBINDEX", "TROPONINI" in the last 168 hours. BNP: Invalid input(s): "POCBNP" CBG: Recent Labs  Lab 05/04/23 1157 05/04/23 1629 05/05/23 0729 05/05/23 1120 05/05/23 1649  GLUCAP 124* 151* 131* 128* 118*   HbA1C: No results for input(s): "HGBA1C" in the last 72 hours. Urine analysis:    Component Value Date/Time   COLORURINE YELLOW 05/01/2023 1700   APPEARANCEUR HAZY (A) 05/01/2023 1700   LABSPEC 1.012 05/01/2023 1700   PHURINE 6.0 05/01/2023 1700   GLUCOSEU NEGATIVE 05/01/2023 1700   HGBUR SMALL (A) 05/01/2023 1700   BILIRUBINUR NEGATIVE 05/01/2023 1700   KETONESUR NEGATIVE 05/01/2023 1700   PROTEINUR 30 (A) 05/01/2023 1700   UROBILINOGEN 0.2 03/11/2010 1057   NITRITE NEGATIVE 05/01/2023 1700   LEUKOCYTESUR LARGE (A) 05/01/2023 1700   Sepsis Labs: @LABRCNTIP (procalcitonin:4,lacticidven:4) ) Recent Results (from the past 240 hours)  Urine Culture     Status: Abnormal   Collection Time: 05/01/23  5:00 PM   Specimen: Urine, Random  Result Value Ref Range Status   Specimen Description   Final    URINE, RANDOM Performed at  [facility], [address], [place], [place] [address]    Special Requests   Final    NONE Reflexed from [id number] Performed at [facility], [address] [place], [place] [address]    Culture (A)  Final    >=100,000 COLONIES/mL ESCHERICHIA COLI Confirmed Extended Spectrum Beta-Lactamase Producer (ESBL).  In bloodstream infections from ESBL organisms, carbapenems are preferred over piperacillin/tazobactam. They are shown to have a lower risk of mortality.    Report Status 05/03/2023 FINAL  Final   Organism ID, Bacteria ESCHERICHIA COLI (A)  Final      Susceptibility   Escherichia coli - MIC*    AMPICILLIN >=32 RESISTANT Resistant     CEFAZOLIN >=64 RESISTANT Resistant  CEFEPIME 16 RESISTANT Resistant     CEFTRIAXONE >=64 RESISTANT Resistant     CIPROFLOXACIN >=4 RESISTANT Resistant     GENTAMICIN <=1 SENSITIVE Sensitive     IMIPENEM <=0.25 SENSITIVE Sensitive     NITROFURANTOIN <=16 SENSITIVE Sensitive     TRIMETH/SULFA <=20 SENSITIVE Sensitive     AMPICILLIN/SULBACTAM >=32 RESISTANT Resistant     PIP/TAZO 8 SENSITIVE Sensitive ug/mL    * >=100,000 COLONIES/mL ESCHERICHIA COLI  Blood Culture (routine x 2)     Status: Abnormal   Collection Time: 05/01/23  5:50 PM   Specimen: BLOOD RIGHT ARM  Result Value Ref Range Status   Specimen Description   Final    BLOOD RIGHT ARM Performed at [facility], [address] [place], [place] [address]    Special Requests   Final    BOTTLES DRAWN AEROBIC AND ANAEROBIC Blood Culture adequate volume Performed at [facility], [address], [place], [place] [address]    Culture  Setup Time   Final    GRAM NEGATIVE RODS IN BOTH AEROBIC AND ANAEROBIC BOTTLES CRITICAL RESULT CALLED TO, READ BACK BY AND VERIFIED WITH: [facility] AT 9147 05/02/2023 BY T KENNEDY Organism ID to follow Performed at [facility], [address], [place], [place] [address]    Culture (A)  Final    ESCHERICHIA COLI Confirmed Extended Spectrum  Beta-Lactamase Producer (ESBL).  In bloodstream infections from ESBL organisms, carbapenems are preferred over piperacillin/tazobactam. They are shown to have a lower risk of mortality.    Report Status 05/04/2023 FINAL  Final   Organism ID, Bacteria ESCHERICHIA COLI  Final      Susceptibility   Escherichia coli - MIC*    AMPICILLIN >=32 RESISTANT Resistant     CEFEPIME 16 RESISTANT Resistant     CEFTAZIDIME RESISTANT Resistant     CEFTRIAXONE >=64 RESISTANT Resistant     CIPROFLOXACIN >=4 RESISTANT Resistant     GENTAMICIN <=1 SENSITIVE Sensitive     IMIPENEM <=0.25 SENSITIVE Sensitive     TRIMETH/SULFA <=20 SENSITIVE Sensitive     AMPICILLIN/SULBACTAM >=32 RESISTANT Resistant     PIP/TAZO 8 SENSITIVE Sensitive ug/mL    * ESCHERICHIA COLI  Blood Culture (routine x 2)     Status: None (Preliminary result)   Collection Time: 05/01/23  5:50 PM   Specimen: BLOOD  Result Value Ref Range Status   Specimen Description BLOOD BLOOD LEFT ARM  Final   Special Requests   Final    BOTTLES DRAWN AEROBIC AND ANAEROBIC Blood Culture adequate volume   Culture   Final    NO GROWTH 4 DAYS Performed at [facility], [address] [place], [place] [address]    Report Status PENDING  Incomplete  Blood Culture ID Panel (Reflexed)     Status: Abnormal   Collection Time: 05/01/23  5:50 PM  Result Value Ref Range Status   Enterococcus faecalis NOT DETECTED NOT DETECTED Final   Enterococcus Faecium NOT DETECTED NOT DETECTED Final   Listeria monocytogenes NOT DETECTED NOT DETECTED Final   Staphylococcus species NOT DETECTED NOT DETECTED Final   Staphylococcus aureus (BCID) NOT DETECTED NOT DETECTED Final   Staphylococcus epidermidis NOT DETECTED NOT DETECTED Final   Staphylococcus lugdunensis NOT DETECTED NOT DETECTED Final   Streptococcus species NOT DETECTED NOT DETECTED Final   Streptococcus agalactiae NOT DETECTED NOT DETECTED Final   Streptococcus pneumoniae NOT DETECTED NOT DETECTED Final    Streptococcus pyogenes NOT DETECTED NOT DETECTED Final  A.calcoaceticus-baumannii NOT DETECTED NOT DETECTED Final   Bacteroides fragilis NOT DETECTED NOT DETECTED Final   Enterobacterales DETECTED (A) NOT DETECTED Final    Comment: Enterobacterales represent a large order of gram negative bacteria, not a single organism. CRITICAL RESULT CALLED TO, READ BACK BY AND VERIFIED WITH: F. Andrey Campanile PHARMD, AT 1255 05/02/23 D. VANHOOK    Enterobacter cloacae complex NOT DETECTED NOT DETECTED Final   Escherichia coli DETECTED (A) NOT DETECTED Final    Comment: CRITICAL RESULT CALLED TO, READ BACK BY AND VERIFIED WITH: F. Andrey Campanile PHARMD, AT 1255 05/02/23 D. VANHOOK    Klebsiella aerogenes NOT DETECTED NOT DETECTED Final   Klebsiella oxytoca NOT DETECTED NOT DETECTED Final   Klebsiella pneumoniae NOT DETECTED NOT DETECTED Final   Proteus species NOT DETECTED NOT DETECTED Final   Salmonella species NOT DETECTED NOT DETECTED Final   Serratia marcescens NOT DETECTED NOT DETECTED Final   Haemophilus influenzae NOT DETECTED NOT DETECTED Final   Neisseria meningitidis NOT DETECTED NOT DETECTED Final   Pseudomonas aeruginosa NOT DETECTED NOT DETECTED Final   Stenotrophomonas maltophilia NOT DETECTED NOT DETECTED Final   Candida albicans NOT DETECTED NOT DETECTED Final   Candida auris NOT DETECTED NOT DETECTED Final   Candida glabrata NOT DETECTED NOT DETECTED Final   Candida krusei NOT DETECTED NOT DETECTED Final   Candida parapsilosis NOT DETECTED NOT DETECTED Final   Candida tropicalis NOT DETECTED NOT DETECTED Final   Cryptococcus neoformans/gattii NOT DETECTED NOT DETECTED Final   CTX-M ESBL DETECTED (A) NOT DETECTED Final    Comment: CRITICAL RESULT CALLED TO, READ BACK BY AND VERIFIED WITH: FAndrey Campanile PHARMD, AT 1255 05/02/23 D. VANHOOK (NOTE) Extended spectrum beta-lactamase detected. Recommend a carbapenem as initial therapy.      Carbapenem resistance IMP NOT DETECTED NOT DETECTED Final    Carbapenem resistance KPC NOT DETECTED NOT DETECTED Final   Carbapenem resistance NDM NOT DETECTED NOT DETECTED Final   Carbapenem resist OXA 48 LIKE NOT DETECTED NOT DETECTED Final   Carbapenem resistance VIM NOT DETECTED NOT DETECTED Final    Comment: Performed at [facility], [address], [place], [place] [address]  MRSA Next Gen by PCR, Nasal     Status: Abnormal   Collection Time: 05/01/23  9:49 PM   Specimen: Nasal Mucosa; Nasal Swab  Result Value Ref Range Status   MRSA by PCR Next Gen DETECTED (A) NOT DETECTED Final    Comment: CRITICAL RESULT CALLED TO, READ BACK BY AND VERIFIED WITH: J. HARTFORD AT 0111 ON 12.18.24 BY ADGER J         The GeneXpert MRSA Assay (FDA approved for NASAL specimens only), is one component of a comprehensive MRSA colonization surveillance program. It is not intended to diagnose MRSA infection nor to guide or monitor treatment for MRSA infections. Performed at [facility], [address], [place], [place] [address]      Scheduled Meds:  apixaban  5 mg Oral BID   Chlorhexidine Gluconate Cloth  6 each Topical Q0600   diclofenac Sodium  2 g Topical QID   folic acid  1 mg Oral Daily   gabapentin  300 mg Oral TID   insulin aspart  0-9 Units Subcutaneous TID WC   insulin glargine-yfgn  10 Units Subcutaneous Daily   lidocaine  1 patch Transdermal Q24H   linaclotide  290 mcg Oral QAC breakfast   lithium carbonate  300 mg Oral QHS   lithium carbonate  600 mg Oral Daily  loratadine  10 mg Oral Daily   melatonin  9 mg Oral QHS   methimazole  5 mg Oral BID   mupirocin ointment  1 Application Nasal BID   polyethylene glycol  17 g Oral BID   QUEtiapine  100 mg Oral QHS   QUEtiapine  25 mg Oral Daily   saccharomyces boulardii  250 mg Oral Daily   tamsulosin  0.4 mg Oral Daily   Continuous Infusions:  meropenem (MERREM) IV 1 g (05/05/23 1442)    Procedures/Studies: DG Abd 1 View Result Date: 05/04/2023 CLINICAL DATA:  Impacted stool  in rectum. EXAM: ABDOMEN - 1 VIEW COMPARISON:  None Available. FINDINGS: The bowel gas pattern is normal. Mild amount of stool is seen in the sigmoid colon and rectum. No radio-opaque calculi or other significant radiographic abnormality are seen. IMPRESSION: Mild amount of stool is seen.  No abnormal bowel dilatation. Electronically Signed   By: Lupita Raider M.D.   On: 05/04/2023 11:07   US THYROID Result Date: 05/03/2023 CLINICAL DATA:  Right thyroid nodule EXAM: THYROID ULTRASOUND TECHNIQUE: Ultrasound examination of the thyroid gland and adjacent soft tissues was performed. COMPARISON:  Ultrasound thyroid 02/14/2022 CT chest abdomen pelvis 05/01/2023 FINDINGS: Parenchymal Echotexture: Mildly heterogenous Isthmus: 0.2 cm Right lobe: 5.2 x 2.5 x 3.2 cm Left lobe: 4.7 x 1.5 x 1.6 cm _________________________________________________________ Estimated total number of nodules >/= 1 cm: 2 Number of spongiform nodules >/=  2 cm not described below (TR1): 0 Number of mixed cystic and solid nodules >/= 1.5 cm not described below (TR2): 0 _________________________________________________________ Nodule # 1: Location: Isthmus; Mid Maximum size: 1.0 cm; Other 2 dimensions: 0.7 x 0.5 cm Composition: solid/almost completely solid (2) Echogenicity: hypoechoic (2) Shape: not taller-than-wide (0) Margins: smooth (0) Echogenic foci: none (0) ACR TI-RADS total points: 4. ACR TI-RADS risk category: TR4 (4-6 points). ACR TI-RADS recommendations: *Given size (>/= 1 - 1.4 cm) and appearance, a follow-up ultrasound in 1 year should be considered based on TI-RADS criteria. _________________________________________________________ Nodule 2: 3.3 x 3.0 x 2.4 cm mixed solid cystic isoechoic right mid thyroid nodule (TI-RADS 2) does not meet criteria for imaging surveillance or FNA. _________________________________________________________ Subcentimeter left thyroid nodules do not meet criteria for FNA or imaging follow-up. IMPRESSION:  1. Mixed solid cystic right mid thyroid nodule corresponds to the abnormality seen on CT and does not meet criteria for FNA or imaging follow-up. 2. Nodule 1 (TI-RADS 4), measuring 1.0 cm, located in the isthmus meets criteria for imaging follow-up. Annual ultrasound surveillance is recommended until 5 years of stability is documented. The above is in keeping with the ACR TI-RADS recommendations - J Am Coll Radiol 2017;14:587-595. Electronically Signed   By: Acquanetta Belling M.D.   On: 05/03/2023 08:25   CT CHEST ABDOMEN PELVIS W CONTRAST Result Date: 05/01/2023 CLINICAL DATA:  Sepsis EXAM: CT CHEST, ABDOMEN, AND PELVIS WITH CONTRAST TECHNIQUE: Multidetector CT imaging of the chest, abdomen and pelvis was performed following the standard protocol during bolus administration of intravenous contrast. RADIATION DOSE REDUCTION: This exam was performed according to the departmental dose-optimization program which includes automated exposure control, adjustment of the mA and/or kV according to patient size and/or use of iterative reconstruction technique. CONTRAST:  OMNIPAQUE IOHEXOL 300 MG/ML  SOLN COMPARISON:  01/24/2023, 11/13/2014, 09/05/2013 FINDINGS: CT CHEST FINDINGS Cardiovascular: Heart size is upper limits of normal. No pericardial effusion. Thoracic aorta is nonaneurysmal. Scattered aortic atherosclerosis. Central pulmonary vasculature is within normal limits. Mediastinum/Nodes: No axillary, mediastinal, or hilar  lymphadenopathy. 2.6 cm low-attenuation nodule in the right thyroid lobe. Trachea and esophagus demonstrate no significant abnormality. Lungs/Pleura: Postsurgical changes within the right upper lobe. Minor left basilar atelectasis. Lungs are otherwise clear. No pleural effusion or pneumothorax. Musculoskeletal: No chest wall mass or suspicious bone lesions identified. Exaggerated thoracic kyphosis. CT ABDOMEN PELVIS FINDINGS Hepatobiliary: Liver measures 22 cm in length. No focal liver  abnormality is seen. No gallstones, gallbladder wall thickening, or biliary dilatation. Pancreas: Unremarkable. No pancreatic ductal dilatation or surrounding inflammatory changes. Spleen: Normal in size without focal abnormality. Adrenals/Urinary Tract: Unremarkable adrenal glands. Longstanding stable hyperattenuating lesion along the medial aspect of the left kidney measuring 2.0 cm which requires no dedicated follow-up imaging. Tiny nonobstructing left renal stone. Right kidney is unremarkable. No hydronephrosis. Urinary bladder within normal limits for the degree of distension. Stomach/Bowel: Moderate-large volume stool throughout the colon. Rectum is dilated with a large stool ball measuring 9 cm in diameter. Mild circumferential rectal wall thickening and mild fat stranding. Tiny hiatal hernia. No dilated loops of small bowel to suggest obstruction. Vascular/Lymphatic: Aortic atherosclerosis. No enlarged abdominal or pelvic lymph nodes. Reproductive: Atrophic uterus.  No adnexal masses. Other: No free fluid. No abdominopelvic fluid collection. No pneumoperitoneum. No abdominal wall hernia. Musculoskeletal: Incidentally noted 7 cm lipoma anterior to the left hip. No acute osseous abnormality. IMPRESSION: 1. Moderate-large volume stool throughout the colon. Rectum is dilated with a large stool ball measuring 9 cm in diameter. Mild circumferential rectal wall thickening and mild fat stranding, suggestive of stercoral colitis. 2. No acute intrathoracic findings. 3. Tiny nonobstructing left renal stone. 4. A 2.6 cm low-attenuation nodule in the right thyroid lobe. Recommend thyroid US (ref: J Am Coll Radiol. 2015 Feb;12(2): 143-50). 5. Hepatomegaly. 6. Aortic atherosclerosis (ICD10-I70.0). Electronically Signed   By: Duanne Guess D.O.   On: 05/01/2023 20:54   DG Chest Port 1 View Result Date: 05/01/2023 CLINICAL DATA:  Sepsis.  Constipation. EXAM: PORTABLE CHEST 1 VIEW COMPARISON:  X-ray 02/18/2023.  FINDINGS: Kyphotic x-ray obscures the apices. Film is also rotated. Stable enlarged cardiopericardial silhouette with tortuous ectatic aorta. No pneumothorax or effusion. No edema. There is some linear opacity left lung base likely scar or atelectasis. Overlapping cardiac leads. Film is under penetrated. IMPRESSION: Limited radiograph.  Stable enlarged cardiopericardial silhouette. Left basilar scar or atelectasis.  No consolidation Electronically Signed   By: Karen Kays M.D.   On: 05/01/2023 18:11    Catarina Hartshorn, DO  [facility]  If 7PM-7AM, please contact night-coverage [email or web address] Password [email or web address] 05/05/2023, 5:29 PM   LOS: 4 days

## 2023-05-06 DIAGNOSIS — A419 Sepsis, unspecified organism: Secondary | ICD-10-CM | POA: Diagnosis not present

## 2023-05-06 DIAGNOSIS — B9620 Unspecified Escherichia coli [E. coli] as the cause of diseases classified elsewhere: Secondary | ICD-10-CM | POA: Diagnosis not present

## 2023-05-06 DIAGNOSIS — A4151 Sepsis due to Escherichia coli [E. coli]: Secondary | ICD-10-CM | POA: Diagnosis not present

## 2023-05-06 DIAGNOSIS — N3091 Cystitis, unspecified with hematuria: Secondary | ICD-10-CM | POA: Diagnosis not present

## 2023-05-06 LAB — CULTURE, BLOOD (ROUTINE X 2)
Culture: NO GROWTH
Special Requests: ADEQUATE

## 2023-05-06 LAB — GLUCOSE, CAPILLARY
Glucose-Capillary: 125 mg/dL — ABNORMAL HIGH (ref 70–99)
Glucose-Capillary: 190 mg/dL — ABNORMAL HIGH (ref 70–99)
Glucose-Capillary: 101 mg/dL — ABNORMAL HIGH (ref 70–99)
Glucose-Capillary: 198 mg/dL — ABNORMAL HIGH (ref 70–99)

## 2023-05-06 NOTE — Progress Notes (Signed)
PROGRESS NOTE  [name] NUU:[id number] DOB: 11/23/1955 DOA: 05/01/2023 PCP: Patient, [name] Per  Brief History:  67 y.o. female with a PMH significant for frequent ESBL UTIs, HLD, severe bipolar 1 disorder, lung cancer, DJD, MDD, acute urinary retention, type 2 diabetes mellitus, DVT.  At baseline, they live at a nursing facility and are dependent for other ADLs.  She is bedbound.   They presented from nursing facility to the ED on 05/01/2023 with reported episode of syncope while having a bowel movement.  Patient denies this episode.  Her only complaint today is pain in her right foot.  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  She requests pain medication oral and topical.  She also requests a trachea which.  Discussed patient's laboratory results of having a urinary tract infection and she states that she has difficulty initiating her urine stream and has to strain but this is not new.  She endorses suprapubic pain.  Endorses history of ESBL UTIs.  States that she has had chills and felt febrile today.  Denies nausea, vomiting, diarrhea.     They were initially treated with meropenem. Urine cultures pending.    Patient was admitted to medicine service for further workup and management of urosepsis as outlined in detail below.   Assessment/Plan:  Severe Sepsis secondary to ESBL E coli UTI  - present on admission - continue meropenem  - blood cultures  = E coli - urinary retention most likely from severe constipation (opioid induced) -sepsis physiology resolved   E coli  bacteremia -continue merrem -no good po alternatives -plan at least 7 days IV merrem -last dose 12/23 at 1400   Constipation Stercoral colitis  - continue miralax BID, plus nightly senna - molasses enema ordered 12/18 and 12/19 - s/p GoLytely>>numerous BMs -12/20 KUB--no bowel dilatation; mild residual stool - appreciate GI eval - started Linzess -continues to have daily soft BMs with bowel  regimen   Urinary Retention  - continue flomax   Chronic pain / opioid dependence  - she is back on home pain management regimen -she is on oxycodone 10 mg>>continue -continue gabapentin   Uncontrolled DM2 with hyperglycemia -continue Semglee 10 units daily -continue novolog sliding scale -CBGs controlled during hospitalization -02/14/23 A1C-- 10.9   History of DVT  - continue apixaban    Hyperthyroidism  - home methimazole resumed   Right thyroid nodule  - US thyroid>>mixed solid/cystic R-midthyroid nodule does not meet criteria for FNA or imaging;  1.0 cm isthmus nodule--annual survelance   Bipolar and MDD - resumed home medication    Hx of DVT -continue apixaban     Family Communication: no  Family at bedside   Consultants:  GI   Code Status:  DNR   DVT Prophylaxis:  apixaban     Procedures: As Listed in Progress Note Above   Antibiotics: Merrem 12/17>>        Subjective: Patient denies fevers, chills, headache, chest pain, dyspnea, nausea, vomiting, diarrhea, abdominal pain, dysuria, hematuria, hematochezia, and melena.   Objective: Vitals:   05/05/23 1410 05/05/23 2132 05/06/23 0549 05/06/23 1258  BP: 133/73 116/75 (!) 122/105 124/65  Pulse: 94 86 79 93  Resp: 17 19 17 18   Temp: 98 F (36.7 C) 98 F (36.7 C) 97.7 F (36.5 C) 98.4 F (36.9 C)  TempSrc:  Oral Oral   SpO2: 95% 99% 98% 98%  Weight:      Height:  Intake/Output Summary (Last 24 hours) at 05/06/2023 1648 Last data filed at 05/06/2023 1300 Gross per 24 hour  Intake 720 ml  Output 1500 ml  Net -780 ml   Weight change:  Exam:  General:  Pt is alert, follows commands appropriately, not in acute distress HEENT: No icterus, No thrush, No neck mass, [place]/AT Cardiovascular: RRR, S1/S2, no rubs, no gallops Respiratory: CTA bilaterally, no wheezing, no crackles, no rhonchi Abdomen: Soft/+BS, non tender, non distended, no guarding Extremities: No edema, No lymphangitis,  No petechiae, No rashes, no synovitis   Data Reviewed: I have personally reviewed following labs and imaging studies Basic Metabolic Panel: Recent Labs  Lab 05/01/23 1750 05/02/23 0435 05/04/23 0429  NA 138 137 138  K 3.8 3.9 3.9  CL 103 103 105  CO2 23 26 28   GLUCOSE 184* 144* 132*  BUN 15 13 10   CREATININE 0.54 0.46 0.40*  CALCIUM 9.7 9.1 9.0  MG  --   --  2.1  PHOS  --   --  3.3   Liver Function Tests: Recent Labs  Lab 05/01/23 1750  AST 18  ALT 28  ALKPHOS 104  BILITOT 0.7  PROT 7.5  ALBUMIN 3.7   No results for input(s): "LIPASE", "AMYLASE" in the last 168 hours. No results for input(s): "AMMONIA" in the last 168 hours. Coagulation Profile: Recent Labs  Lab 05/01/23 1750  INR 1.0   CBC: Recent Labs  Lab 05/01/23 1750 05/02/23 0435  WBC 7.0 12.0*  NEUTROABS 6.5  --   HGB 14.7 13.0  HCT 45.6 41.0  MCV 93.4 93.2  PLT 195 197   Cardiac Enzymes: No results for input(s): "CKTOTAL", "CKMB", "CKMBINDEX", "TROPONINI" in the last 168 hours. BNP: Invalid input(s): "POCBNP" CBG: Recent Labs  Lab 05/05/23 1649 05/05/23 2145 05/06/23 0722 05/06/23 1112 05/06/23 1629  GLUCAP 118* 163* 125* 190* 101*   HbA1C: No results for input(s): "HGBA1C" in the last 72 hours. Urine analysis:    Component Value Date/Time   COLORURINE YELLOW 05/01/2023 1700   APPEARANCEUR HAZY (A) 05/01/2023 1700   LABSPEC 1.012 05/01/2023 1700   PHURINE 6.0 05/01/2023 1700   GLUCOSEU NEGATIVE 05/01/2023 1700   HGBUR SMALL (A) 05/01/2023 1700   BILIRUBINUR NEGATIVE 05/01/2023 1700   KETONESUR NEGATIVE 05/01/2023 1700   PROTEINUR 30 (A) 05/01/2023 1700   UROBILINOGEN 0.2 03/11/2010 1057   NITRITE NEGATIVE 05/01/2023 1700   LEUKOCYTESUR LARGE (A) 05/01/2023 1700   Sepsis Labs: @LABRCNTIP (procalcitonin:4,lacticidven:4) ) Recent Results (from the past 240 hours)  Urine Culture     Status: Abnormal   Collection Time: 05/01/23  5:00 PM   Specimen: Urine, Random  Result  Value Ref Range Status   Specimen Description   Final    URINE, RANDOM Performed at [facility], [address], [place], [place] [address]    Special Requests   Final    NONE Reflexed from [id number] Performed at [facility], [address] [place], [place] [address]    Culture (A)  Final    >=100,000 COLONIES/mL ESCHERICHIA COLI Confirmed Extended Spectrum Beta-Lactamase Producer (ESBL).  In bloodstream infections from ESBL organisms, carbapenems are preferred over piperacillin/tazobactam. They are shown to have a lower risk of mortality.    Report Status 05/03/2023 FINAL  Final   Organism ID, Bacteria ESCHERICHIA COLI (A)  Final      Susceptibility   Escherichia coli - MIC*    AMPICILLIN >=32 RESISTANT Resistant     CEFAZOLIN >=64 RESISTANT Resistant  CEFEPIME 16 RESISTANT Resistant     CEFTRIAXONE >=64 RESISTANT Resistant     CIPROFLOXACIN >=4 RESISTANT Resistant     GENTAMICIN <=1 SENSITIVE Sensitive     IMIPENEM <=0.25 SENSITIVE Sensitive     NITROFURANTOIN <=16 SENSITIVE Sensitive     TRIMETH/SULFA <=20 SENSITIVE Sensitive     AMPICILLIN/SULBACTAM >=32 RESISTANT Resistant     PIP/TAZO 8 SENSITIVE Sensitive ug/mL    * >=100,000 COLONIES/mL ESCHERICHIA COLI  Blood Culture (routine x 2)     Status: Abnormal   Collection Time: 05/01/23  5:50 PM   Specimen: BLOOD RIGHT ARM  Result Value Ref Range Status   Specimen Description   Final    BLOOD RIGHT ARM Performed at [facility], [address], [place], [place] [address]    Special Requests   Final    BOTTLES DRAWN AEROBIC AND ANAEROBIC Blood Culture adequate volume Performed at [facility], [address], [place], [place] [address]    Culture  Setup Time   Final    GRAM NEGATIVE RODS IN BOTH AEROBIC AND ANAEROBIC BOTTLES CRITICAL RESULT CALLED TO, READ BACK BY AND VERIFIED WITH: [facility] BLACKWELL AT 9629 05/02/2023 BY T KENNEDY Organism ID to follow Performed at [facility], [address]  [place], [place] [address]    Culture (A)  Final    ESCHERICHIA COLI Confirmed Extended Spectrum Beta-Lactamase Producer (ESBL).  In bloodstream infections from ESBL organisms, carbapenems are preferred over piperacillin/tazobactam. They are shown to have a lower risk of mortality.    Report Status 05/04/2023 FINAL  Final   Organism ID, Bacteria ESCHERICHIA COLI  Final      Susceptibility   Escherichia coli - MIC*    AMPICILLIN >=32 RESISTANT Resistant     CEFEPIME 16 RESISTANT Resistant     CEFTAZIDIME RESISTANT Resistant     CEFTRIAXONE >=64 RESISTANT Resistant     CIPROFLOXACIN >=4 RESISTANT Resistant     GENTAMICIN <=1 SENSITIVE Sensitive     IMIPENEM <=0.25 SENSITIVE Sensitive     TRIMETH/SULFA <=20 SENSITIVE Sensitive     AMPICILLIN/SULBACTAM >=32 RESISTANT Resistant     PIP/TAZO 8 SENSITIVE Sensitive ug/mL    * ESCHERICHIA COLI  Blood Culture (routine x 2)     Status: None   Collection Time: 05/01/23  5:50 PM   Specimen: BLOOD  Result Value Ref Range Status   Specimen Description BLOOD BLOOD LEFT ARM  Final   Special Requests   Final    BOTTLES DRAWN AEROBIC AND ANAEROBIC Blood Culture adequate volume   Culture   Final    NO GROWTH 5 DAYS Performed at [facility], [address] [place], [place] [address]    Report Status 05/06/2023 FINAL  Final  Blood Culture ID Panel (Reflexed)     Status: Abnormal   Collection Time: 05/01/23  5:50 PM  Result Value Ref Range Status   Enterococcus faecalis NOT DETECTED NOT DETECTED Final   Enterococcus Faecium NOT DETECTED NOT DETECTED Final   Listeria monocytogenes NOT DETECTED NOT DETECTED Final   Staphylococcus species NOT DETECTED NOT DETECTED Final   Staphylococcus aureus (BCID) NOT DETECTED NOT DETECTED Final   Staphylococcus epidermidis NOT DETECTED NOT DETECTED Final   Staphylococcus lugdunensis NOT DETECTED NOT DETECTED Final   Streptococcus species NOT DETECTED NOT DETECTED Final   Streptococcus agalactiae NOT DETECTED NOT  DETECTED Final   Streptococcus pneumoniae NOT DETECTED NOT DETECTED Final   Streptococcus pyogenes NOT DETECTED NOT DETECTED Final  A.calcoaceticus-baumannii NOT DETECTED NOT DETECTED Final   Bacteroides fragilis NOT DETECTED NOT DETECTED Final   Enterobacterales DETECTED (A) NOT DETECTED Final    Comment: Enterobacterales represent a large order of gram negative bacteria, not a single organism. CRITICAL RESULT CALLED TO, READ BACK BY AND VERIFIED WITH: F. Andrey Campanile PHARMD, AT 1255 05/02/23 D. VANHOOK    Enterobacter cloacae complex NOT DETECTED NOT DETECTED Final   Escherichia coli DETECTED (A) NOT DETECTED Final    Comment: CRITICAL RESULT CALLED TO, READ BACK BY AND VERIFIED WITH: F. Andrey Campanile PHARMD, AT 1255 05/02/23 D. VANHOOK    Klebsiella aerogenes NOT DETECTED NOT DETECTED Final   Klebsiella oxytoca NOT DETECTED NOT DETECTED Final   Klebsiella pneumoniae NOT DETECTED NOT DETECTED Final   Proteus species NOT DETECTED NOT DETECTED Final   Salmonella species NOT DETECTED NOT DETECTED Final   Serratia marcescens NOT DETECTED NOT DETECTED Final   Haemophilus influenzae NOT DETECTED NOT DETECTED Final   Neisseria meningitidis NOT DETECTED NOT DETECTED Final   Pseudomonas aeruginosa NOT DETECTED NOT DETECTED Final   Stenotrophomonas maltophilia NOT DETECTED NOT DETECTED Final   Candida albicans NOT DETECTED NOT DETECTED Final   Candida auris NOT DETECTED NOT DETECTED Final   Candida glabrata NOT DETECTED NOT DETECTED Final   Candida krusei NOT DETECTED NOT DETECTED Final   Candida parapsilosis NOT DETECTED NOT DETECTED Final   Candida tropicalis NOT DETECTED NOT DETECTED Final   Cryptococcus neoformans/gattii NOT DETECTED NOT DETECTED Final   CTX-M ESBL DETECTED (A) NOT DETECTED Final    Comment: CRITICAL RESULT CALLED TO, READ BACK BY AND VERIFIED WITH: FAndrey Campanile PHARMD, AT 1255 05/02/23 D. VANHOOK (NOTE) Extended spectrum beta-lactamase detected. Recommend a carbapenem as initial  therapy.      Carbapenem resistance IMP NOT DETECTED NOT DETECTED Final   Carbapenem resistance KPC NOT DETECTED NOT DETECTED Final   Carbapenem resistance NDM NOT DETECTED NOT DETECTED Final   Carbapenem resist OXA 48 LIKE NOT DETECTED NOT DETECTED Final   Carbapenem resistance VIM NOT DETECTED NOT DETECTED Final    Comment: Performed at [facility], [address], [place], [place] [address]  MRSA Next Gen by PCR, Nasal     Status: Abnormal   Collection Time: 05/01/23  9:49 PM   Specimen: Nasal Mucosa; Nasal Swab  Result Value Ref Range Status   MRSA by PCR Next Gen DETECTED (A) NOT DETECTED Final    Comment: CRITICAL RESULT CALLED TO, READ BACK BY AND VERIFIED WITH: J. HARTFORD AT 0111 ON 12.18.24 BY ADGER J         The GeneXpert MRSA Assay (FDA approved for NASAL specimens only), is one component of a comprehensive MRSA colonization surveillance program. It is not intended to diagnose MRSA infection nor to guide or monitor treatment for MRSA infections. Performed at [facility], [address] [place], [place] [address]      Scheduled Meds:  apixaban  5 mg Oral BID   Chlorhexidine Gluconate Cloth  6 each Topical Q0600   diclofenac Sodium  2 g Topical QID   folic acid  1 mg Oral Daily   gabapentin  300 mg Oral TID   insulin aspart  0-9 Units Subcutaneous TID WC   insulin glargine-yfgn  10 Units Subcutaneous Daily   lidocaine  1 patch Transdermal Q24H   linaclotide  290 mcg Oral QAC breakfast   lithium carbonate  300 mg Oral QHS   lithium carbonate  600 mg Oral Daily  loratadine  10 mg Oral Daily   melatonin  9 mg Oral QHS   methimazole  5 mg Oral BID   mupirocin ointment  1 Application Nasal BID   polyethylene glycol  17 g Oral BID   QUEtiapine  100 mg Oral QHS   QUEtiapine  25 mg Oral Daily   saccharomyces boulardii  250 mg Oral Daily   tamsulosin  0.4 mg Oral Daily   Continuous Infusions:  meropenem (MERREM) IV 1 g (05/06/23 1310)     Procedures/Studies: DG Abd 1 View Result Date: 05/04/2023 CLINICAL DATA:  Impacted stool in rectum. EXAM: ABDOMEN - 1 VIEW COMPARISON:  None Available. FINDINGS: The bowel gas pattern is normal. Mild amount of stool is seen in the sigmoid colon and rectum. No radio-opaque calculi or other significant radiographic abnormality are seen. IMPRESSION: Mild amount of stool is seen.  No abnormal bowel dilatation. Electronically Signed   By: Lupita Raider M.D.   On: 05/04/2023 11:07   US THYROID Result Date: 05/03/2023 CLINICAL DATA:  Right thyroid nodule EXAM: THYROID ULTRASOUND TECHNIQUE: Ultrasound examination of the thyroid gland and adjacent soft tissues was performed. COMPARISON:  Ultrasound thyroid 02/14/2022 CT chest abdomen pelvis 05/01/2023 FINDINGS: Parenchymal Echotexture: Mildly heterogenous Isthmus: 0.2 cm Right lobe: 5.2 x 2.5 x 3.2 cm Left lobe: 4.7 x 1.5 x 1.6 cm _________________________________________________________ Estimated total number of nodules >/= 1 cm: 2 Number of spongiform nodules >/=  2 cm not described below (TR1): 0 Number of mixed cystic and solid nodules >/= 1.5 cm not described below (TR2): 0 _________________________________________________________ Nodule # 1: Location: Isthmus; Mid Maximum size: 1.0 cm; Other 2 dimensions: 0.7 x 0.5 cm Composition: solid/almost completely solid (2) Echogenicity: hypoechoic (2) Shape: not taller-than-wide (0) Margins: smooth (0) Echogenic foci: none (0) ACR TI-RADS total points: 4. ACR TI-RADS risk category: TR4 (4-6 points). ACR TI-RADS recommendations: *Given size (>/= 1 - 1.4 cm) and appearance, a follow-up ultrasound in 1 year should be considered based on TI-RADS criteria. _________________________________________________________ Nodule 2: 3.3 x 3.0 x 2.4 cm mixed solid cystic isoechoic right mid thyroid nodule (TI-RADS 2) does not meet criteria for imaging surveillance or FNA. _________________________________________________________  Subcentimeter left thyroid nodules do not meet criteria for FNA or imaging follow-up. IMPRESSION: 1. Mixed solid cystic right mid thyroid nodule corresponds to the abnormality seen on CT and does not meet criteria for FNA or imaging follow-up. 2. Nodule 1 (TI-RADS 4), measuring 1.0 cm, located in the isthmus meets criteria for imaging follow-up. Annual ultrasound surveillance is recommended until 5 years of stability is documented. The above is in keeping with the ACR TI-RADS recommendations - J Am Coll Radiol 2017;14:587-595. Electronically Signed   By: Acquanetta Belling M.D.   On: 05/03/2023 08:25   CT CHEST ABDOMEN PELVIS W CONTRAST Result Date: 05/01/2023 CLINICAL DATA:  Sepsis EXAM: CT CHEST, ABDOMEN, AND PELVIS WITH CONTRAST TECHNIQUE: Multidetector CT imaging of the chest, abdomen and pelvis was performed following the standard protocol during bolus administration of intravenous contrast. RADIATION DOSE REDUCTION: This exam was performed according to the departmental dose-optimization program which includes automated exposure control, adjustment of the mA and/or kV according to patient size and/or use of iterative reconstruction technique. CONTRAST:  OMNIPAQUE IOHEXOL 300 MG/ML  SOLN COMPARISON:  01/24/2023, 11/13/2014, 09/05/2013 FINDINGS: CT CHEST FINDINGS Cardiovascular: Heart size is upper limits of normal. No pericardial effusion. Thoracic aorta is nonaneurysmal. Scattered aortic atherosclerosis. Central pulmonary vasculature is within normal limits. Mediastinum/Nodes: No axillary, mediastinal, or hilar  lymphadenopathy. 2.6 cm low-attenuation nodule in the right thyroid lobe. Trachea and esophagus demonstrate no significant abnormality. Lungs/Pleura: Postsurgical changes within the right upper lobe. Minor left basilar atelectasis. Lungs are otherwise clear. No pleural effusion or pneumothorax. Musculoskeletal: No chest wall mass or suspicious bone lesions identified. Exaggerated thoracic kyphosis.  CT ABDOMEN PELVIS FINDINGS Hepatobiliary: Liver measures 22 cm in length. No focal liver abnormality is seen. No gallstones, gallbladder wall thickening, or biliary dilatation. Pancreas: Unremarkable. No pancreatic ductal dilatation or surrounding inflammatory changes. Spleen: Normal in size without focal abnormality. Adrenals/Urinary Tract: Unremarkable adrenal glands. Longstanding stable hyperattenuating lesion along the medial aspect of the left kidney measuring 2.0 cm which requires no dedicated follow-up imaging. Tiny nonobstructing left renal stone. Right kidney is unremarkable. No hydronephrosis. Urinary bladder within normal limits for the degree of distension. Stomach/Bowel: Moderate-large volume stool throughout the colon. Rectum is dilated with a large stool ball measuring 9 cm in diameter. Mild circumferential rectal wall thickening and mild fat stranding. Tiny hiatal hernia. No dilated loops of small bowel to suggest obstruction. Vascular/Lymphatic: Aortic atherosclerosis. No enlarged abdominal or pelvic lymph nodes. Reproductive: Atrophic uterus.  No adnexal masses. Other: No free fluid. No abdominopelvic fluid collection. No pneumoperitoneum. No abdominal wall hernia. Musculoskeletal: Incidentally noted 7 cm lipoma anterior to the left hip. No acute osseous abnormality. IMPRESSION: 1. Moderate-large volume stool throughout the colon. Rectum is dilated with a large stool ball measuring 9 cm in diameter. Mild circumferential rectal wall thickening and mild fat stranding, suggestive of stercoral colitis. 2. No acute intrathoracic findings. 3. Tiny nonobstructing left renal stone. 4. A 2.6 cm low-attenuation nodule in the right thyroid lobe. Recommend thyroid US (ref: J Am Coll Radiol. 2015 Feb;12(2): 143-50). 5. Hepatomegaly. 6. Aortic atherosclerosis (ICD10-I70.0). Electronically Signed   By: Duanne Guess D.O.   On: 05/01/2023 20:54   DG Chest Port 1 View Result Date: 05/01/2023 CLINICAL DATA:   Sepsis.  Constipation. EXAM: PORTABLE CHEST 1 VIEW COMPARISON:  X-ray 02/18/2023. FINDINGS: Kyphotic x-ray obscures the apices. Film is also rotated. Stable enlarged cardiopericardial silhouette with tortuous ectatic aorta. No pneumothorax or effusion. No edema. There is some linear opacity left lung base likely scar or atelectasis. Overlapping cardiac leads. Film is under penetrated. IMPRESSION: Limited radiograph.  Stable enlarged cardiopericardial silhouette. Left basilar scar or atelectasis.  No consolidation Electronically Signed   By: Karen Kays M.D.   On: 05/01/2023 18:11    Catarina Hartshorn, DO  [facility]  If 7PM-7AM, please contact night-coverage [email or web address] Password [facility] 05/06/2023, 4:48 PM   LOS: 5 days

## 2023-05-06 NOTE — Plan of Care (Signed)

## 2023-05-07 DIAGNOSIS — A4151 Sepsis due to Escherichia coli [E. coli]: Secondary | ICD-10-CM | POA: Diagnosis not present

## 2023-05-07 DIAGNOSIS — N390 Urinary tract infection, site not specified: Secondary | ICD-10-CM | POA: Diagnosis not present

## 2023-05-07 DIAGNOSIS — B9620 Unspecified Escherichia coli [E. coli] as the cause of diseases classified elsewhere: Secondary | ICD-10-CM | POA: Diagnosis not present

## 2023-05-07 LAB — GLUCOSE, CAPILLARY
Glucose-Capillary: 132 mg/dL — ABNORMAL HIGH (ref 70–99)
Glucose-Capillary: 297 mg/dL — ABNORMAL HIGH (ref 70–99)
Glucose-Capillary: 96 mg/dL (ref 70–99)

## 2023-05-07 MED ORDER — LINACLOTIDE 290 MCG PO CAPS: 290.0000 ug | ORAL_CAPSULE | Freq: Every day | ORAL | Status: DC

## 2023-05-07 MED ORDER — POLYETHYLENE GLYCOL 3350 17 G PO PACK: 17.0000 g | PACK | Freq: Two times a day (BID) | ORAL | Status: DC

## 2023-05-07 MED ORDER — OXYCODONE-ACETAMINOPHEN 10-325 MG PO TABS: 1.0000 | ORAL_TABLET | Freq: Four times a day (QID) | ORAL | 0 refills | Status: DC | PRN

## 2023-05-07 NOTE — Plan of Care (Signed)
  Problem: Coping: Goal: Ability to adjust to condition or change in health will improve Outcome: Progressing   Problem: Fluid Volume: Goal: Ability to maintain a balanced intake and output will improve Outcome: Progressing   Problem: Health Behavior/Discharge Planning: Goal: Ability to manage health-related needs will improve Outcome: Progressing   

## 2023-05-07 NOTE — Discharge Summary (Signed)
Physician Discharge Summary  LIZMARI TOOMEY XLK:[id number] DOB: 19-Jul-1955 DOA: 05/01/2023   Admit date: 05/01/2023 Discharge date: 05/07/2023  Admitted From:  [facility] Disposition: [facility]  Recommendations for Outpatient Follow-up:  Follow up with PCP in 1-2 weeks Please monitor blood sugar at least 3 times per day Please maintain good bowel regimen to avoid severe constipation  Discharge Condition: STABLE   CODE STATUS: DNR DIET: Dysphagia 3    Brief Hospitalization Summary: Please see all hospital notes, images, labs for full details of the hospitalization. Admission provider HPI:  67 y.o. female with a PMH significant for frequent ESBL UTIs, HLD, severe bipolar 1 disorder, lung cancer, DJD, MDD, acute urinary retention, type 2 diabetes mellitus, DVT.  At baseline, they live at a nursing facility and are dependent for other ADLs.  She is bedbound.   They presented from nursing facility to the ED on 05/01/2023 with reported episode of syncope while having a bowel movement.  Patient denies this episode.  Her only complaint today is pain in her right foot.  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  She requests pain medication oral and topical.  She also requests a trachea which.  Discussed patient's laboratory results of having a urinary tract infection and she states that she has difficulty initiating her urine stream and has to strain but this is not new.  She endorses suprapubic pain.  Endorses history of ESBL UTIs.  States that she has had chills and felt febrile today.  Denies nausea, vomiting, diarrhea.     They were initially treated with meropenem. Urine cultures pending.    Patient was admitted to medicine service for further workup and management of urosepsis as outlined in detail below.  Hospital Course by problem list  Severe Sepsis secondary to ESBL E coli UTI  - present on admission - fully treated with IV meropenem  - blood cultures  = ESBL E coli -  urinary retention most likely from severe constipation (opioid induced) - sepsis physiology resolved   E coli  bacteremia -completed full course of IV meropenem -no good po alternatives -completed 7 days of IV merrem -last dose 12/23 at 1400   Obstipation/Severe opioid induced Constipation Stercoral colitis  - continue miralax BID - molasses enema ordered 12/18 and 12/19 - s/p GoLytely>>numerous BMs -12/20 KUB--no bowel dilatation; mild residual stool - appreciate GI eval - started Linzess -continues to have daily soft BMs with bowel regimen -outpatient follow up with GI in 2-3 weeks    Urinary Retention  - continue flomax   Chronic pain / opioid dependence  - she is back on home pain management regimen -she is on oxycodone 10 mg>>continue -continue gabapentin -bowel regimen as above    Uncontrolled DM2 with hyperglycemia -continue Semglee 10 units daily -continue novolog sliding scale -CBGs controlled during hospitalization -02/14/23 A1C-- 10.9 -monitor CBG at least 3 times daily recommended    History of DVT  - continue apixaban    Hyperthyroidism  - home methimazole resumed   Right thyroid nodule  - US thyroid>>mixed solid/cystic R-midthyroid nodule does not meet criteria for FNA or imaging;  1.0 cm isthmus nodule--annual surveillance   Bipolar and MDD - resumed home medication    Hx of DVT -continue apixaban   Discharge Diagnoses:  Principal Problem:   Sepsis due to urinary tract infection (HCC) Active Problems:   Sepsis due to Escherichia coli (E. coli) (HCC)   E coli bacteremia   Cystitis with hematuria  Discharge Instructions:  Allergies as of 05/07/2023       Reactions   Sulfa Antibiotics Swelling, Rash   Neck swelling        Medication List     STOP taking these medications    busPIRone 5 MG tablet Commonly known as: BUSPAR   chlorhexidine 0.12 % solution Commonly known as: PERIDEX   Effer-K 25 MEQ disintegrating  tablet Generic drug: potassium bicarbonate   LORazepam 0.5 MG tablet Commonly known as: ATIVAN   LORazepam 2 MG/ML injection Commonly known as: ATIVAN   senna 8.6 MG tablet Commonly known as: SENOKOT       TAKE these medications    acetaminophen 325 MG tablet Commonly known as: TYLENOL Take 650 mg by mouth every 4 (four) hours as needed for mild pain.   ascorbic acid 500 MG tablet Commonly known as: VITAMIN C Take 500 mg by mouth 2 (two) times daily.   Basaglar KwikPen 100 UNIT/ML Inject 10 Units into the skin daily.   bisacodyl 10 MG suppository Commonly known as: DULCOLAX Place 10 mg rectally as needed for moderate constipation.   calcium carbonate 500 MG chewable tablet Commonly known as: TUMS - dosed in mg elemental calcium Chew 1 tablet by mouth every 5 (five) hours as needed for indigestion or heartburn.   cetirizine 10 MG tablet Commonly known as: ZYRTEC Take 10 mg by mouth daily. For allergies   diclofenac Sodium 1 % Gel Commonly known as: VOLTAREN Apply 2 g topically 4 (four) times daily.   Eliquis 5 MG Tabs tablet Generic drug: apixaban Take 5 mg by mouth 2 (two) times daily.   Fiasp FlexTouch 100 UNIT/ML FlexTouch Pen Generic drug: insulin aspart Inject 0-10 Units into the skin 4 (four) times daily -  before meals and at bedtime. Short acting insulin (Aspart) per sliding scale 0-10 ---:-- Insulin injection 0-10 Units 0-10 Units Subcutaneous, 3 times daily with meals CBG 70 - 120: 0 unit CBG 121 - 150: 0 unit  CBG 151 - 200: 1 unit CBG 201 - 250: 2 units CBG 251 - 300: 4 units CBG 301 - 350: 6 units  CBG 351 - 400: 8 units  CBG > 400: 10 units   folic acid 1 MG tablet Commonly known as: FOLVITE Take 1 mg by mouth daily.   gabapentin 100 MG capsule Commonly known as: NEURONTIN Take 300 mg by mouth 3 (three) times daily.   LACTOBACILLUS PO Take 1 capsule by mouth daily.   lactulose 10 GM/15ML solution Commonly known as: CHRONULAC Take 30 mLs by  mouth daily as needed for moderate constipation.   lidocaine 5 % Commonly known as: LIDODERM Place 1 patch onto the skin daily. Remove & Discard patch within 12 hours or as directed by MD; Apply to LOWER LEG   linaclotide 290 MCG Caps capsule Commonly known as: LINZESS Take 1 capsule (290 mcg total) by mouth daily before breakfast. Start taking on: May 08, 2023   lithium carbonate 300 MG capsule Take 300 mg by mouth at bedtime.   lithium 600 MG capsule Take 600 mg by mouth daily.   loperamide 2 MG tablet Commonly known as: IMODIUM A-D Take 2 mg by mouth every 6 (six) hours as needed for diarrhea or loose stools.   Melatonin 10 MG Tabs Take 10 mg by mouth at bedtime.   metFORMIN 500 MG tablet Commonly known as: GLUCOPHAGE Take 1 pill a day What changed:  how much to take how to take this  when to take this   methimazole 5 MG tablet Commonly known as: TAPAZOLE Take 1 tablet (5 mg total) by mouth 2 (two) times daily.   MULTIVITAMIN ADULT PO Take 1 tablet by mouth daily.   ondansetron 4 MG tablet Commonly known as: ZOFRAN Take 4 mg by mouth every 8 (eight) hours as needed.   oxyCODONE-acetaminophen 10-325 MG tablet Commonly known as: PERCOCET Take 1 tablet by mouth every 6 (six) hours as needed for pain. What changed:  when to take this reasons to take this   phenol 1.4 % Liqd Commonly known as: CHLORASEPTIC Use as directed 5 sprays in the mouth or throat every 2 (two) hours as needed for throat irritation / pain.   polyethylene glycol 17 g packet Commonly known as: MIRALAX / GLYCOLAX Take 17 g by mouth 2 (two) times daily.   QUEtiapine 100 MG tablet Commonly known as: SEROQUEL Take 100 mg by mouth at bedtime.   QUEtiapine 25 MG tablet Commonly known as: SEROQUEL Take 25 mg by mouth daily.   tamsulosin 0.4 MG Caps capsule Commonly known as: FLOMAX Take 0.4 mg by mouth daily.        Allergies  Allergen Reactions   Sulfa Antibiotics Swelling  and Rash    Neck swelling   Allergies as of 05/07/2023       Reactions   Sulfa Antibiotics Swelling, Rash   Neck swelling        Medication List     STOP taking these medications    busPIRone 5 MG tablet Commonly known as: BUSPAR   chlorhexidine 0.12 % solution Commonly known as: PERIDEX   Effer-K 25 MEQ disintegrating tablet Generic drug: potassium bicarbonate   LORazepam 0.5 MG tablet Commonly known as: ATIVAN   LORazepam 2 MG/ML injection Commonly known as: ATIVAN   senna 8.6 MG tablet Commonly known as: SENOKOT       TAKE these medications    acetaminophen 325 MG tablet Commonly known as: TYLENOL Take 650 mg by mouth every 4 (four) hours as needed for mild pain.   ascorbic acid 500 MG tablet Commonly known as: VITAMIN C Take 500 mg by mouth 2 (two) times daily.   Basaglar KwikPen 100 UNIT/ML Inject 10 Units into the skin daily.   bisacodyl 10 MG suppository Commonly known as: DULCOLAX Place 10 mg rectally as needed for moderate constipation.   calcium carbonate 500 MG chewable tablet Commonly known as: TUMS - dosed in mg elemental calcium Chew 1 tablet by mouth every 5 (five) hours as needed for indigestion or heartburn.   cetirizine 10 MG tablet Commonly known as: ZYRTEC Take 10 mg by mouth daily. For allergies   diclofenac Sodium 1 % Gel Commonly known as: VOLTAREN Apply 2 g topically 4 (four) times daily.   Eliquis 5 MG Tabs tablet Generic drug: apixaban Take 5 mg by mouth 2 (two) times daily.   Fiasp FlexTouch 100 UNIT/ML FlexTouch Pen Generic drug: insulin aspart Inject 0-10 Units into the skin 4 (four) times daily -  before meals and at bedtime. Short acting insulin (Aspart) per sliding scale 0-10 ---:-- Insulin injection 0-10 Units 0-10 Units Subcutaneous, 3 times daily with meals CBG 70 - 120: 0 unit CBG 121 - 150: 0 unit  CBG 151 - 200: 1 unit CBG 201 - 250: 2 units CBG 251 - 300: 4 units CBG 301 - 350: 6 units  CBG 351 - 400: 8  units  CBG > 400: 10 units  folic acid 1 MG tablet Commonly known as: FOLVITE Take 1 mg by mouth daily.   gabapentin 100 MG capsule Commonly known as: NEURONTIN Take 300 mg by mouth 3 (three) times daily.   LACTOBACILLUS PO Take 1 capsule by mouth daily.   lactulose 10 GM/15ML solution Commonly known as: CHRONULAC Take 30 mLs by mouth daily as needed for moderate constipation.   lidocaine 5 % Commonly known as: LIDODERM Place 1 patch onto the skin daily. Remove & Discard patch within 12 hours or as directed by MD; Apply to LOWER LEG   linaclotide 290 MCG Caps capsule Commonly known as: LINZESS Take 1 capsule (290 mcg total) by mouth daily before breakfast. Start taking on: May 08, 2023   lithium carbonate 300 MG capsule Take 300 mg by mouth at bedtime.   lithium 600 MG capsule Take 600 mg by mouth daily.   loperamide 2 MG tablet Commonly known as: IMODIUM A-D Take 2 mg by mouth every 6 (six) hours as needed for diarrhea or loose stools.   Melatonin 10 MG Tabs Take 10 mg by mouth at bedtime.   metFORMIN 500 MG tablet Commonly known as: GLUCOPHAGE Take 1 pill a day What changed:  how much to take how to take this when to take this   methimazole 5 MG tablet Commonly known as: TAPAZOLE Take 1 tablet (5 mg total) by mouth 2 (two) times daily.   MULTIVITAMIN ADULT PO Take 1 tablet by mouth daily.   ondansetron 4 MG tablet Commonly known as: ZOFRAN Take 4 mg by mouth every 8 (eight) hours as needed.   oxyCODONE-acetaminophen 10-325 MG tablet Commonly known as: PERCOCET Take 1 tablet by mouth every 6 (six) hours as needed for pain. What changed:  when to take this reasons to take this   phenol 1.4 % Liqd Commonly known as: CHLORASEPTIC Use as directed 5 sprays in the mouth or throat every 2 (two) hours as needed for throat irritation / pain.   polyethylene glycol 17 g packet Commonly known as: MIRALAX / GLYCOLAX Take 17 g by mouth 2 (two) times  daily.   QUEtiapine 100 MG tablet Commonly known as: SEROQUEL Take 100 mg by mouth at bedtime.   QUEtiapine 25 MG tablet Commonly known as: SEROQUEL Take 25 mg by mouth daily.   tamsulosin 0.4 MG Caps capsule Commonly known as: FLOMAX Take 0.4 mg by mouth daily.        Procedures/Studies: DG Abd 1 View Result Date: 05/04/2023 CLINICAL DATA:  Impacted stool in rectum. EXAM: ABDOMEN - 1 VIEW COMPARISON:  None Available. FINDINGS: The bowel gas pattern is normal. Mild amount of stool is seen in the sigmoid colon and rectum. No radio-opaque calculi or other significant radiographic abnormality are seen. IMPRESSION: Mild amount of stool is seen.  No abnormal bowel dilatation. Electronically Signed   By: Lupita Raider M.D.   On: 05/04/2023 11:07   US THYROID Result Date: 05/03/2023 CLINICAL DATA:  Right thyroid nodule EXAM: THYROID ULTRASOUND TECHNIQUE: Ultrasound examination of the thyroid gland and adjacent soft tissues was performed. COMPARISON:  Ultrasound thyroid 02/14/2022 CT chest abdomen pelvis 05/01/2023 FINDINGS: Parenchymal Echotexture: Mildly heterogenous Isthmus: 0.2 cm Right lobe: 5.2 x 2.5 x 3.2 cm Left lobe: 4.7 x 1.5 x 1.6 cm _________________________________________________________ Estimated total number of nodules >/= 1 cm: 2 Number of spongiform nodules >/=  2 cm not described below (TR1): 0 Number of mixed cystic and solid nodules >/= 1.5 cm not described below (TR2):  0 _________________________________________________________ Nodule # 1: Location: Isthmus; Mid Maximum size: 1.0 cm; Other 2 dimensions: 0.7 x 0.5 cm Composition: solid/almost completely solid (2) Echogenicity: hypoechoic (2) Shape: not taller-than-wide (0) Margins: smooth (0) Echogenic foci: none (0) ACR TI-RADS total points: 4. ACR TI-RADS risk category: TR4 (4-6 points). ACR TI-RADS recommendations: *Given size (>/= 1 - 1.4 cm) and appearance, a follow-up ultrasound in 1 year should be considered based on  TI-RADS criteria. _________________________________________________________ Nodule 2: 3.3 x 3.0 x 2.4 cm mixed solid cystic isoechoic right mid thyroid nodule (TI-RADS 2) does not meet criteria for imaging surveillance or FNA. _________________________________________________________ Subcentimeter left thyroid nodules do not meet criteria for FNA or imaging follow-up. IMPRESSION: 1. Mixed solid cystic right mid thyroid nodule corresponds to the abnormality seen on CT and does not meet criteria for FNA or imaging follow-up. 2. Nodule 1 (TI-RADS 4), measuring 1.0 cm, located in the isthmus meets criteria for imaging follow-up. Annual ultrasound surveillance is recommended until 5 years of stability is documented. The above is in keeping with the ACR TI-RADS recommendations - J Am Coll Radiol 2017;14:587-595. Electronically Signed   By: Acquanetta Belling M.D.   On: 05/03/2023 08:25   CT CHEST ABDOMEN PELVIS W CONTRAST Result Date: 05/01/2023 CLINICAL DATA:  Sepsis EXAM: CT CHEST, ABDOMEN, AND PELVIS WITH CONTRAST TECHNIQUE: Multidetector CT imaging of the chest, abdomen and pelvis was performed following the standard protocol during bolus administration of intravenous contrast. RADIATION DOSE REDUCTION: This exam was performed according to the departmental dose-optimization program which includes automated exposure control, adjustment of the mA and/or kV according to patient size and/or use of iterative reconstruction technique. CONTRAST:  OMNIPAQUE IOHEXOL 300 MG/ML  SOLN COMPARISON:  01/24/2023, 11/13/2014, 09/05/2013 FINDINGS: CT CHEST FINDINGS Cardiovascular: Heart size is upper limits of normal. No pericardial effusion. Thoracic aorta is nonaneurysmal. Scattered aortic atherosclerosis. Central pulmonary vasculature is within normal limits. Mediastinum/Nodes: No axillary, mediastinal, or hilar lymphadenopathy. 2.6 cm low-attenuation nodule in the right thyroid lobe. Trachea and esophagus demonstrate no  significant abnormality. Lungs/Pleura: Postsurgical changes within the right upper lobe. Minor left basilar atelectasis. Lungs are otherwise clear. No pleural effusion or pneumothorax. Musculoskeletal: No chest wall mass or suspicious bone lesions identified. Exaggerated thoracic kyphosis. CT ABDOMEN PELVIS FINDINGS Hepatobiliary: Liver measures 22 cm in length. No focal liver abnormality is seen. No gallstones, gallbladder wall thickening, or biliary dilatation. Pancreas: Unremarkable. No pancreatic ductal dilatation or surrounding inflammatory changes. Spleen: Normal in size without focal abnormality. Adrenals/Urinary Tract: Unremarkable adrenal glands. Longstanding stable hyperattenuating lesion along the medial aspect of the left kidney measuring 2.0 cm which requires no dedicated follow-up imaging. Tiny nonobstructing left renal stone. Right kidney is unremarkable. No hydronephrosis. Urinary bladder within normal limits for the degree of distension. Stomach/Bowel: Moderate-large volume stool throughout the colon. Rectum is dilated with a large stool ball measuring 9 cm in diameter. Mild circumferential rectal wall thickening and mild fat stranding. Tiny hiatal hernia. No dilated loops of small bowel to suggest obstruction. Vascular/Lymphatic: Aortic atherosclerosis. No enlarged abdominal or pelvic lymph nodes. Reproductive: Atrophic uterus.  No adnexal masses. Other: No free fluid. No abdominopelvic fluid collection. No pneumoperitoneum. No abdominal wall hernia. Musculoskeletal: Incidentally noted 7 cm lipoma anterior to the left hip. No acute osseous abnormality. IMPRESSION: 1. Moderate-large volume stool throughout the colon. Rectum is dilated with a large stool ball measuring 9 cm in diameter. Mild circumferential rectal wall thickening and mild fat stranding, suggestive of stercoral colitis. 2. No acute intrathoracic findings. 3. Tiny  nonobstructing left renal stone. 4. A 2.6 cm low-attenuation nodule in  the right thyroid lobe. Recommend thyroid US (ref: J Am Coll Radiol. 2015 Feb;12(2): 143-50). 5. Hepatomegaly. 6. Aortic atherosclerosis (ICD10-I70.0). Electronically Signed   By: Duanne Guess D.O.   On: 05/01/2023 20:54   DG Chest Port 1 View Result Date: 05/01/2023 CLINICAL DATA:  Sepsis.  Constipation. EXAM: PORTABLE CHEST 1 VIEW COMPARISON:  X-ray 02/18/2023. FINDINGS: Kyphotic x-ray obscures the apices. Film is also rotated. Stable enlarged cardiopericardial silhouette with tortuous ectatic aorta. No pneumothorax or effusion. No edema. There is some linear opacity left lung base likely scar or atelectasis. Overlapping cardiac leads. Film is under penetrated. IMPRESSION: Limited radiograph.  Stable enlarged cardiopericardial silhouette. Left basilar scar or atelectasis.  No consolidation Electronically Signed   By: Karen Kays M.D.   On: 05/01/2023 18:11     Subjective: Pt having regular bowel movements now.  Eating and drinking well with no problems.   Discharge Exam: Vitals:   05/06/23 1927 05/07/23 0448  BP: (!) 151/90 103/64  Pulse: 89 78  Resp: 17 17  Temp: 98.4 F (36.9 C) 98 F (36.7 C)  SpO2: 98% 95%   Vitals:   05/06/23 0549 05/06/23 1258 05/06/23 1927 05/07/23 0448  BP: (!) 122/105 124/65 (!) 151/90 103/64  Pulse: 79 93 89 78  Resp: 17 18 17 17   Temp: 97.7 F (36.5 C) 98.4 F (36.9 C) 98.4 F (36.9 C) 98 F (36.7 C)  TempSrc: Oral  Oral Oral  SpO2: 98% 98% 98% 95%  Weight:      Height:       General: Pt is alert, awake, not in acute distress Cardiovascular: normal S1/S2 +, no rubs, no gallops Respiratory: CTA bilaterally, no wheezing, no rhonchi Abdominal: Soft, NT, ND, bowel sounds + Extremities: no edema, no cyanosis   The results of significant diagnostics from this hospitalization (including imaging, microbiology, ancillary and laboratory) are listed below for reference.     Microbiology: Recent Results (from the past 240 hours)  Urine Culture      Status: Abnormal   Collection Time: 05/01/23  5:00 PM   Specimen: Urine, Random  Result Value Ref Range Status   Specimen Description   Final    URINE, RANDOM Performed at [facility], [address] [place], [place] [address]    Special Requests   Final    NONE Reflexed from [id number] Performed at [facility], [address], [place], [place] [address]    Culture (A)  Final    >=100,000 COLONIES/mL ESCHERICHIA COLI Confirmed Extended Spectrum Beta-Lactamase Producer (ESBL).  In bloodstream infections from ESBL organisms, carbapenems are preferred over piperacillin/tazobactam. They are shown to have a lower risk of mortality.    Report Status 05/03/2023 FINAL  Final   Organism ID, Bacteria ESCHERICHIA COLI (A)  Final      Susceptibility   Escherichia coli - MIC*    AMPICILLIN >=32 RESISTANT Resistant     CEFAZOLIN >=64 RESISTANT Resistant     CEFEPIME 16 RESISTANT Resistant     CEFTRIAXONE >=64 RESISTANT Resistant     CIPROFLOXACIN >=4 RESISTANT Resistant     GENTAMICIN <=1 SENSITIVE Sensitive     IMIPENEM <=0.25 SENSITIVE Sensitive     NITROFURANTOIN <=16 SENSITIVE Sensitive     TRIMETH/SULFA <=20 SENSITIVE Sensitive     AMPICILLIN/SULBACTAM >=32 RESISTANT Resistant     PIP/TAZO 8 SENSITIVE Sensitive ug/mL    * >=100,000 COLONIES/mL ESCHERICHIA COLI  Blood Culture (routine x  2)     Status: Abnormal   Collection Time: 05/01/23  5:50 PM   Specimen: BLOOD RIGHT ARM  Result Value Ref Range Status   Specimen Description   Final    BLOOD RIGHT ARM Performed at [facility], [address], [place], [place] [address]    Special Requests   Final    BOTTLES DRAWN AEROBIC AND ANAEROBIC Blood Culture adequate volume Performed at [facility], [address], [place], [place] [address]    Culture  Setup Time   Final    GRAM NEGATIVE RODS IN BOTH AEROBIC AND ANAEROBIC BOTTLES CRITICAL RESULT CALLED TO, READ BACK BY AND VERIFIED WITH: [facility] BL[facility]WELL AT 7846 05/02/2023 BY T  KENNEDY Organism ID to follow Performed at [facility], [address], [place], [place] [address]    Culture (A)  Final    ESCHERICHIA COLI Confirmed Extended Spectrum Beta-Lactamase Producer (ESBL).  In bloodstream infections from ESBL organisms, carbapenems are preferred over piperacillin/tazobactam. They are shown to have a lower risk of mortality.    Report Status 05/04/2023 FINAL  Final   Organism ID, Bacteria ESCHERICHIA COLI  Final      Susceptibility   Escherichia coli - MIC*    AMPICILLIN >=32 RESISTANT Resistant     CEFEPIME 16 RESISTANT Resistant     CEFTAZIDIME RESISTANT Resistant     CEFTRIAXONE >=64 RESISTANT Resistant     CIPROFLOXACIN >=4 RESISTANT Resistant     GENTAMICIN <=1 SENSITIVE Sensitive     IMIPENEM <=0.25 SENSITIVE Sensitive     TRIMETH/SULFA <=20 SENSITIVE Sensitive     AMPICILLIN/SULBACTAM >=32 RESISTANT Resistant     PIP/TAZO 8 SENSITIVE Sensitive ug/mL    * ESCHERICHIA COLI  Blood Culture (routine x 2)     Status: None   Collection Time: 05/01/23  5:50 PM   Specimen: BLOOD  Result Value Ref Range Status   Specimen Description BLOOD BLOOD LEFT ARM  Final   Special Requests   Final    BOTTLES DRAWN AEROBIC AND ANAEROBIC Blood Culture adequate volume   Culture   Final    NO GROWTH 5 DAYS Performed at [facility], [address], [place], [place] [address]    Report Status 05/06/2023 FINAL  Final  Blood Culture ID Panel (Reflexed)     Status: Abnormal   Collection Time: 05/01/23  5:50 PM  Result Value Ref Range Status   Enterococcus faecalis NOT DETECTED NOT DETECTED Final   Enterococcus Faecium NOT DETECTED NOT DETECTED Final   Listeria monocytogenes NOT DETECTED NOT DETECTED Final   Staphylococcus species NOT DETECTED NOT DETECTED Final   Staphylococcus aureus (BCID) NOT DETECTED NOT DETECTED Final   Staphylococcus epidermidis NOT DETECTED NOT DETECTED Final   Staphylococcus lugdunensis NOT DETECTED NOT DETECTED Final   Streptococcus  species NOT DETECTED NOT DETECTED Final   Streptococcus agalactiae NOT DETECTED NOT DETECTED Final   Streptococcus pneumoniae NOT DETECTED NOT DETECTED Final   Streptococcus pyogenes NOT DETECTED NOT DETECTED Final   A.calcoaceticus-baumannii NOT DETECTED NOT DETECTED Final   Bacteroides fragilis NOT DETECTED NOT DETECTED Final   Enterobacterales DETECTED (A) NOT DETECTED Final    Comment: Enterobacterales represent a large order of gram negative bacteria, not a single organism. CRITICAL RESULT CALLED TO, READ BACK BY AND VERIFIED WITH: Kelby Fam PHARMD, AT 1255 05/02/23 D. VANHOOK    Enterobacter cloacae complex NOT DETECTED NOT DETECTED Final   Escherichia coli DETECTED (A) NOT DETECTED Final    Comment:  CRITICAL RESULT CALLED TO, READ BACK BY AND VERIFIED WITH: Kelby Fam PHARMD, AT 1255 05/02/23 D. VANHOOK    Klebsiella aerogenes NOT DETECTED NOT DETECTED Final   Klebsiella oxytoca NOT DETECTED NOT DETECTED Final   Klebsiella pneumoniae NOT DETECTED NOT DETECTED Final   Proteus species NOT DETECTED NOT DETECTED Final   Salmonella species NOT DETECTED NOT DETECTED Final   Serratia marcescens NOT DETECTED NOT DETECTED Final   Haemophilus influenzae NOT DETECTED NOT DETECTED Final   Neisseria meningitidis NOT DETECTED NOT DETECTED Final   Pseudomonas aeruginosa NOT DETECTED NOT DETECTED Final   Stenotrophomonas maltophilia NOT DETECTED NOT DETECTED Final   Candida albicans NOT DETECTED NOT DETECTED Final   Candida auris NOT DETECTED NOT DETECTED Final   Candida glabrata NOT DETECTED NOT DETECTED Final   Candida krusei NOT DETECTED NOT DETECTED Final   Candida parapsilosis NOT DETECTED NOT DETECTED Final   Candida tropicalis NOT DETECTED NOT DETECTED Final   Cryptococcus neoformans/gattii NOT DETECTED NOT DETECTED Final   CTX-M ESBL DETECTED (A) NOT DETECTED Final    Comment: CRITICAL RESULT CALLED TO, READ BACK BY AND VERIFIED WITH: FAndrey Campanile PHARMD, AT 1255 05/02/23 D.  VANHOOK (NOTE) Extended spectrum beta-lactamase detected. Recommend a carbapenem as initial therapy.      Carbapenem resistance IMP NOT DETECTED NOT DETECTED Final   Carbapenem resistance KPC NOT DETECTED NOT DETECTED Final   Carbapenem resistance NDM NOT DETECTED NOT DETECTED Final   Carbapenem resist OXA 48 LIKE NOT DETECTED NOT DETECTED Final   Carbapenem resistance VIM NOT DETECTED NOT DETECTED Final    Comment: Performed at [facility], [address] [place], [place] [address]  MRSA Next Gen by PCR, Nasal     Status: Abnormal   Collection Time: 05/01/23  9:49 PM   Specimen: Nasal Mucosa; Nasal Swab  Result Value Ref Range Status   MRSA by PCR Next Gen DETECTED (A) NOT DETECTED Final    Comment: CRITICAL RESULT CALLED TO, READ BACK BY AND VERIFIED WITH: J. HARTFORD AT 0111 ON 12.18.24 BY ADGER J         The GeneXpert MRSA Assay (FDA approved for NASAL specimens only), is one component of a comprehensive MRSA colonization surveillance program. It is not intended to diagnose MRSA infection nor to guide or monitor treatment for MRSA infections. Performed at [facility], [address], [place], [place] [address]      Labs: BNP (last 3 results) Recent Labs    01/24/23 2015  BNP 20.0   Basic Metabolic Panel: Recent Labs  Lab 05/01/23 1750 05/02/23 0435 05/04/23 0429  NA 138 137 138  K 3.8 3.9 3.9  CL 103 103 105  CO2 23 26 28   GLUCOSE 184* 144* 132*  BUN 15 13 10   CREATININE 0.54 0.46 0.40*  CALCIUM 9.7 9.1 9.0  MG  --   --  2.1  PHOS  --   --  3.3   Liver Function Tests: Recent Labs  Lab 05/01/23 1750  AST 18  ALT 28  ALKPHOS 104  BILITOT 0.7  PROT 7.5  ALBUMIN 3.7   No results for input(s): "LIPASE", "AMYLASE" in the last 168 hours. No results for input(s): "AMMONIA" in the last 168 hours. CBC: Recent Labs  Lab 05/01/23 1750 05/02/23 0435  WBC 7.0 12.0*  NEUTROABS 6.5  --   HGB 14.7 13.0  HCT 45.6 41.0  MCV 93.4 93.2  PLT 195  197   Cardiac Enzymes: No results for  input(s): "CKTOTAL", "CKMB", "CKMBINDEX", "TROPONINI" in the last 168 hours. BNP: Invalid input(s): "POCBNP" CBG: Recent Labs  Lab 05/06/23 1112 05/06/23 1629 05/06/23 2139 05/07/23 0732 05/07/23 1106  GLUCAP 190* 101* 198* 132* 297*   D-Dimer No results for input(s): "DDIMER" in the last 72 hours. Hgb A1c No results for input(s): "HGBA1C" in the last 72 hours. Lipid Profile No results for input(s): "CHOL", "HDL", "LDLCALC", "TRIG", "CHOLHDL", "LDLDIRECT" in the last 72 hours. Thyroid function studies No results for input(s): "TSH", "T4TOTAL", "T3FREE", "THYROIDAB" in the last 72 hours.  Invalid input(s): "FREET3" Anemia work up No results for input(s): "VITAMINB12", "FOLATE", "FERRITIN", "TIBC", "IRON", "RETICCTPCT" in the last 72 hours. Urinalysis    Component Value Date/Time   COLORURINE YELLOW 05/01/2023 1700   APPEARANCEUR HAZY (A) 05/01/2023 1700   LABSPEC 1.012 05/01/2023 1700   PHURINE 6.0 05/01/2023 1700   GLUCOSEU NEGATIVE 05/01/2023 1700   HGBUR SMALL (A) 05/01/2023 1700   BILIRUBINUR NEGATIVE 05/01/2023 1700   KETONESUR NEGATIVE 05/01/2023 1700   PROTEINUR 30 (A) 05/01/2023 1700   UROBILINOGEN 0.2 03/11/2010 1057   NITRITE NEGATIVE 05/01/2023 1700   LEUKOCYTESUR LARGE (A) 05/01/2023 1700   Sepsis Labs Recent Labs  Lab 05/01/23 1750 05/02/23 0435  WBC 7.0 12.0*   Microbiology Recent Results (from the past 240 hours)  Urine Culture     Status: Abnormal   Collection Time: 05/01/23  5:00 PM   Specimen: Urine, Random  Result Value Ref Range Status   Specimen Description   Final    URINE, RANDOM Performed at [facility], [address], [place], [place] [address]    Special Requests   Final    NONE Reflexed from [id number] Performed at [facility], [address] [place], [place] [address]    Culture (A)  Final    >=100,000 COLONIES/mL ESCHERICHIA COLI Confirmed Extended Spectrum Beta-Lactamase Producer  (ESBL).  In bloodstream infections from ESBL organisms, carbapenems are preferred over piperacillin/tazobactam. They are shown to have a lower risk of mortality.    Report Status 05/03/2023 FINAL  Final   Organism ID, Bacteria ESCHERICHIA COLI (A)  Final      Susceptibility   Escherichia coli - MIC*    AMPICILLIN >=32 RESISTANT Resistant     CEFAZOLIN >=64 RESISTANT Resistant     CEFEPIME 16 RESISTANT Resistant     CEFTRIAXONE >=64 RESISTANT Resistant     CIPROFLOXACIN >=4 RESISTANT Resistant     GENTAMICIN <=1 SENSITIVE Sensitive     IMIPENEM <=0.25 SENSITIVE Sensitive     NITROFURANTOIN <=16 SENSITIVE Sensitive     TRIMETH/SULFA <=20 SENSITIVE Sensitive     AMPICILLIN/SULBACTAM >=32 RESISTANT Resistant     PIP/TAZO 8 SENSITIVE Sensitive ug/mL    * >=100,000 COLONIES/mL ESCHERICHIA COLI  Blood Culture (routine x 2)     Status: Abnormal   Collection Time: 05/01/23  5:50 PM   Specimen: BLOOD RIGHT ARM  Result Value Ref Range Status   Specimen Description   Final    BLOOD RIGHT ARM Performed at [facility], [address], [place], [place] [address]    Special Requests   Final    BOTTLES DRAWN AEROBIC AND ANAEROBIC Blood Culture adequate volume Performed at [facility], [address] [place], [place] [address]    Culture  Setup Time   Final    GRAM NEGATIVE RODS IN BOTH AEROBIC AND ANAEROBIC BOTTLES CRITICAL RESULT CALLED TO, READ BACK BY AND VERIFIED WITH: [facility] AT 6295 05/02/2023 BY T  KENNEDY Organism ID to follow Performed at [facility], [address] [place], [place] [address]    Culture (A)  Final    ESCHERICHIA COLI Confirmed Extended Spectrum Beta-Lactamase Producer (ESBL).  In bloodstream infections from ESBL organisms, carbapenems are preferred over piperacillin/tazobactam. They are shown to have a lower risk of mortality.    Report Status 05/04/2023 FINAL  Final   Organism ID, Bacteria ESCHERICHIA COLI  Final      Susceptibility    Escherichia coli - MIC*    AMPICILLIN >=32 RESISTANT Resistant     CEFEPIME 16 RESISTANT Resistant     CEFTAZIDIME RESISTANT Resistant     CEFTRIAXONE >=64 RESISTANT Resistant     CIPROFLOXACIN >=4 RESISTANT Resistant     GENTAMICIN <=1 SENSITIVE Sensitive     IMIPENEM <=0.25 SENSITIVE Sensitive     TRIMETH/SULFA <=20 SENSITIVE Sensitive     AMPICILLIN/SULBACTAM >=32 RESISTANT Resistant     PIP/TAZO 8 SENSITIVE Sensitive ug/mL    * ESCHERICHIA COLI  Blood Culture (routine x 2)     Status: None   Collection Time: 05/01/23  5:50 PM   Specimen: BLOOD  Result Value Ref Range Status   Specimen Description BLOOD BLOOD LEFT ARM  Final   Special Requests   Final    BOTTLES DRAWN AEROBIC AND ANAEROBIC Blood Culture adequate volume   Culture   Final    NO GROWTH 5 DAYS Performed at [facility], [address] [place], [place] [address]    Report Status 05/06/2023 FINAL  Final  Blood Culture ID Panel (Reflexed)     Status: Abnormal   Collection Time: 05/01/23  5:50 PM  Result Value Ref Range Status   Enterococcus faecalis NOT DETECTED NOT DETECTED Final   Enterococcus Faecium NOT DETECTED NOT DETECTED Final   Listeria monocytogenes NOT DETECTED NOT DETECTED Final   Staphylococcus species NOT DETECTED NOT DETECTED Final   Staphylococcus aureus (BCID) NOT DETECTED NOT DETECTED Final   Staphylococcus epidermidis NOT DETECTED NOT DETECTED Final   Staphylococcus lugdunensis NOT DETECTED NOT DETECTED Final   Streptococcus species NOT DETECTED NOT DETECTED Final   Streptococcus agalactiae NOT DETECTED NOT DETECTED Final   Streptococcus pneumoniae NOT DETECTED NOT DETECTED Final   Streptococcus pyogenes NOT DETECTED NOT DETECTED Final   A.calcoaceticus-baumannii NOT DETECTED NOT DETECTED Final   Bacteroides fragilis NOT DETECTED NOT DETECTED Final   Enterobacterales DETECTED (A) NOT DETECTED Final    Comment: Enterobacterales represent a large order of gram negative bacteria, not a single  organism. CRITICAL RESULT CALLED TO, READ BACK BY AND VERIFIED WITH: F. Andrey Campanile PHARMD, AT 1255 05/02/23 D. VANHOOK    Enterobacter cloacae complex NOT DETECTED NOT DETECTED Final   Escherichia coli DETECTED (A) NOT DETECTED Final    Comment: CRITICAL RESULT CALLED TO, READ BACK BY AND VERIFIED WITH: F. Andrey Campanile PHARMD, AT 1255 05/02/23 D. VANHOOK    Klebsiella aerogenes NOT DETECTED NOT DETECTED Final   Klebsiella oxytoca NOT DETECTED NOT DETECTED Final   Klebsiella pneumoniae NOT DETECTED NOT DETECTED Final   Proteus species NOT DETECTED NOT DETECTED Final   Salmonella species NOT DETECTED NOT DETECTED Final   Serratia marcescens NOT DETECTED NOT DETECTED Final   Haemophilus influenzae NOT DETECTED NOT DETECTED Final   Neisseria meningitidis NOT DETECTED NOT DETECTED Final   Pseudomonas aeruginosa NOT DETECTED NOT DETECTED Final   Stenotrophomonas maltophilia NOT DETECTED NOT DETECTED Final   Candida albicans NOT DETECTED NOT DETECTED Final   Candida auris NOT  DETECTED NOT DETECTED Final   Candida glabrata NOT DETECTED NOT DETECTED Final   Candida krusei NOT DETECTED NOT DETECTED Final   Candida parapsilosis NOT DETECTED NOT DETECTED Final   Candida tropicalis NOT DETECTED NOT DETECTED Final   Cryptococcus neoformans/gattii NOT DETECTED NOT DETECTED Final   CTX-M ESBL DETECTED (A) NOT DETECTED Final    Comment: CRITICAL RESULT CALLED TO, READ BACK BY AND VERIFIED WITH: FAndrey Campanile PHARMD, AT 1255 05/02/23 D. VANHOOK (NOTE) Extended spectrum beta-lactamase detected. Recommend a carbapenem as initial therapy.      Carbapenem resistance IMP NOT DETECTED NOT DETECTED Final   Carbapenem resistance KPC NOT DETECTED NOT DETECTED Final   Carbapenem resistance NDM NOT DETECTED NOT DETECTED Final   Carbapenem resist OXA 48 LIKE NOT DETECTED NOT DETECTED Final   Carbapenem resistance VIM NOT DETECTED NOT DETECTED Final    Comment: Performed at [facility], [address],  [place], [place] [address]  MRSA Next Gen by PCR, Nasal     Status: Abnormal   Collection Time: 05/01/23  9:49 PM   Specimen: Nasal Mucosa; Nasal Swab  Result Value Ref Range Status   MRSA by PCR Next Gen DETECTED (A) NOT DETECTED Final    Comment: CRITICAL RESULT CALLED TO, READ BACK BY AND VERIFIED WITH: J. HARTFORD AT 0111 ON 12.18.24 BY ADGER J         The GeneXpert MRSA Assay (FDA approved for NASAL specimens only), is one component of a comprehensive MRSA colonization surveillance program. It is not intended to diagnose MRSA infection nor to guide or monitor treatment for MRSA infections. Performed at [facility], [address] [place], [place] [address]    Time coordinating discharge: 47 mins  SIGNED:  Standley Dakins, MD  [facility] 05/07/2023, 11:51 AM How to contact the [facility] Attending or Consulting provider 7A - 7P or covering provider during after hours 7P -7A, for this patient?  Check the care team in [facility] and look for a) attending/consulting TR[facility] provider listed and b) the [facility] team listed Log into [email or web address] and use [place]'s universal password to access. If you do not have the password, please contact the hospital operator. Locate the [facility] provider you are looking for under [facility] and page to a number that you can be directly reached. If you still have difficulty reaching the provider, please page the [facility] (Director on Call) for the Hospitalists listed on amion for assistance.

## 2023-05-07 NOTE — Progress Notes (Signed)
Attempted to call report to RN at [facility] with no answer.

## 2023-05-07 NOTE — Care Management Important Message (Signed)
Important Message  Patient Details  Name: [name] MRN: [id number] Date of Birth: [birth year]   Important Message Given:  Yes - Medicare IM (spoke with [name] by phone to review letter, no additonal copy needed)     Corey Harold 05/07/2023, 12:32 PM

## 2023-05-07 NOTE — TOC Transition Note (Signed)
Transition of Care [facility]) - Discharge Note   Patient Details  Name: [name] MRN: [id number] Date of Birth: 10-26-1955  Transition of Care [facility]) CM/SW Contact:  Catalina Gravel, LCSW Phone Number: 05/07/2023, 1:21 PM   Clinical Narrative:     Pt DC ready, contacted facility and received room report- patient can return after 2 PM. [place] to Dr. And RN staff advising. CSW visited pt in room, she said she was anxious to get back to her bed.  CSW let her know that I will request transportation. CSW called [facility]. To place patient on list.  No further TOC needs at this time.     Barriers to Discharge: Continued Medical Work up   Patient Goals and CMS Choice Patient states their goals for this hospitalization and ongoing recovery are:: return to SNF   Choice offered to / list presented to : Adult Children [place] ownership interest in [facility].provided to::  (n/a)    Discharge Placement                       Discharge Plan and Services Additional resources added to the After Visit Summary for   In-house Referral: Clinical Social Work   Post Acute Care Choice: Skilled Nursing Facility                               Social Drivers of Health (SDOH) Interventions SDOH Screenings   Food Insecurity: No Food Insecurity (05/01/2023)  Housing: Low Risk  (05/01/2023)  Transportation Needs: No Transportation Needs (05/01/2023)  Utilities: Not At Risk (05/01/2023)  Alcohol Screen: Low Risk  (11/14/2017)  Social Connections: Unknown (07/05/2021)   Received from [facility]  Tobacco Use: Medium Risk (05/01/2023)     Readmission Risk Interventions    05/02/2023    8:07 AM 02/14/2023    9:57 AM 08/03/2021    1:25 PM  Readmission Risk Prevention Plan  Transportation Screening Complete Complete Complete  PCP or Specialist Appt within 5-7 Days   Complete  Home Care Screening   Complete  Medication Review (RN CM)   Complete  Medication  Review Ocean[employer]) Complete Complete   HRI or Home Care Consult Complete Complete   SW Recovery Care/Counseling Consult Complete Complete   Palliative Care Screening Not Applicable Not Applicable   Skilled Nursing Facility Complete Complete

## 2023-05-07 NOTE — Progress Notes (Signed)
Report called to [place] at [facility]

## 2023-05-07 NOTE — Discharge Instructions (Signed)
IMPORTANT INFORMATION: PAY CLOSE ATTENTION   PHYSICIAN DISCHARGE INSTRUCTIONS  Follow with Primary care provider  Patient, [name] Per  and other consultants as instructed by your Hospitalist Physician  SEEK MEDICAL CARE OR RETURN TO EMERGENCY ROOM IF SYMPTOMS COME BACK, WORSEN OR NEW PROBLEM DEVELOPS   Please note: You were cared for by a hospitalist during your hospital stay. Every effort will be made to forward records to your primary care provider.  You can request that your primary care provider send for your hospital records if they have not received them.  Once you are discharged, your primary care physician will handle any further medical issues. Please note that NO REFILLS for any discharge medications will be authorized once you are discharged, as it is imperative that you return to your primary care physician (or establish a relationship with a primary care physician if you do not have one) for your post hospital discharge needs so that they can reassess your need for medications and monitor your lab values.  Please get a complete blood count and chemistry panel checked by your Primary MD at your next visit, and again as instructed by your Primary MD.  Get Medicines reviewed and adjusted: Please take all your medications with you for your next visit with your Primary MD  Laboratory/radiological data: Please request your Primary MD to go over all hospital tests and procedure/radiological results at the follow up, please ask your primary care provider to get all Hospital records sent to his/her office.  In some cases, they will be blood work, cultures and biopsy results pending at the time of your discharge. Please request that your primary care provider follow up on these results.  If you are diabetic, please bring your blood sugar readings with you to your follow up appointment with primary care.    Please call and make your follow up appointments as soon as possible.    Also Note  the following: If you experience worsening of your admission symptoms, develop shortness of breath, life threatening emergency, suicidal or homicidal thoughts you must seek medical attention immediately by calling 911 or calling your MD immediately  if symptoms less severe.  You must read complete instructions/literature along with all the possible adverse reactions/side effects for all the Medicines you take and that have been prescribed to you. Take any new Medicines after you have completely understood and accpet all the possible adverse reactions/side effects.   Do not drive when taking Pain medications or sleeping medications (Benzodiazepines)  Do not take more than prescribed Pain, Sleep and Anxiety Medications. It is not advisable to combine anxiety,sleep and pain medications without talking with your primary care practitioner  Special Instructions: If you have smoked or chewed Tobacco  in the last 2 yrs please stop smoking, stop any regular Alcohol  and or any Recreational drug use.  Wear Seat belts while driving.  Do not drive if taking any narcotic, mind altering or controlled substances or recreational drugs or alcohol.        

## 2023-05-21 ENCOUNTER — Emergency Department (HOSPITAL_COMMUNITY): Payer: Medicare Other

## 2023-05-21 ENCOUNTER — Inpatient Hospital Stay (HOSPITAL_COMMUNITY)
Admission: EM | Admit: 2023-05-21 | Discharge: 2023-05-24 | DRG: 603 | Disposition: A | Discharge status: Other Acute Inpatient Hospital | Payer: Medicare Other | Attending: Family Medicine | Admitting: Family Medicine

## 2023-05-21 ENCOUNTER — Inpatient Hospital Stay (HOSPITAL_COMMUNITY): Payer: Medicare Other

## 2023-05-21 ENCOUNTER — Other Ambulatory Visit: Payer: Self-pay

## 2023-05-21 ENCOUNTER — Encounter (HOSPITAL_COMMUNITY): Payer: Self-pay | Admitting: Internal Medicine

## 2023-05-21 DIAGNOSIS — F319 Bipolar disorder, unspecified: Secondary | ICD-10-CM | POA: Diagnosis present

## 2023-05-21 DIAGNOSIS — Z800 Family history of malignant neoplasm of digestive organs: Secondary | ICD-10-CM

## 2023-05-21 DIAGNOSIS — Z7901 Long term (current) use of anticoagulants: Secondary | ICD-10-CM

## 2023-05-21 DIAGNOSIS — Z902 Acquired absence of lung [part of]: Secondary | ICD-10-CM

## 2023-05-21 DIAGNOSIS — L03032 Cellulitis of left toe: Principal | ICD-10-CM | POA: Diagnosis present

## 2023-05-21 DIAGNOSIS — Z85118 Personal history of other malignant neoplasm of bronchus and lung: Secondary | ICD-10-CM | POA: Diagnosis not present

## 2023-05-21 DIAGNOSIS — Z87891 Personal history of nicotine dependence: Secondary | ICD-10-CM

## 2023-05-21 DIAGNOSIS — L600 Ingrowing nail: Secondary | ICD-10-CM | POA: Diagnosis present

## 2023-05-21 DIAGNOSIS — L02612 Cutaneous abscess of left foot: Secondary | ICD-10-CM | POA: Diagnosis present

## 2023-05-21 DIAGNOSIS — I82409 Acute embolism and thrombosis of unspecified deep veins of unspecified lower extremity: Secondary | ICD-10-CM | POA: Diagnosis present

## 2023-05-21 DIAGNOSIS — Z794 Long term (current) use of insulin: Secondary | ICD-10-CM

## 2023-05-21 DIAGNOSIS — K5903 Drug induced constipation: Secondary | ICD-10-CM | POA: Diagnosis present

## 2023-05-21 DIAGNOSIS — F332 Major depressive disorder, recurrent severe without psychotic features: Secondary | ICD-10-CM | POA: Diagnosis present

## 2023-05-21 DIAGNOSIS — E1369 Other specified diabetes mellitus with other specified complication: Secondary | ICD-10-CM

## 2023-05-21 DIAGNOSIS — I10 Essential (primary) hypertension: Secondary | ICD-10-CM | POA: Diagnosis present

## 2023-05-21 DIAGNOSIS — K5289 Other specified noninfective gastroenteritis and colitis: Secondary | ICD-10-CM | POA: Diagnosis present

## 2023-05-21 DIAGNOSIS — Z803 Family history of malignant neoplasm of breast: Secondary | ICD-10-CM | POA: Diagnosis not present

## 2023-05-21 DIAGNOSIS — Z6834 Body mass index (BMI) 34.0-34.9, adult: Secondary | ICD-10-CM | POA: Diagnosis not present

## 2023-05-21 DIAGNOSIS — Z882 Allergy status to sulfonamides status: Secondary | ICD-10-CM | POA: Diagnosis not present

## 2023-05-21 DIAGNOSIS — Z833 Family history of diabetes mellitus: Secondary | ICD-10-CM | POA: Diagnosis not present

## 2023-05-21 DIAGNOSIS — Z86718 Personal history of other venous thrombosis and embolism: Secondary | ICD-10-CM | POA: Diagnosis not present

## 2023-05-21 DIAGNOSIS — E119 Type 2 diabetes mellitus without complications: Secondary | ICD-10-CM

## 2023-05-21 DIAGNOSIS — E0590 Thyrotoxicosis, unspecified without thyrotoxic crisis or storm: Secondary | ICD-10-CM | POA: Diagnosis present

## 2023-05-21 DIAGNOSIS — Z8619 Personal history of other infectious and parasitic diseases: Secondary | ICD-10-CM

## 2023-05-21 DIAGNOSIS — T402X5A Adverse effect of other opioids, initial encounter: Secondary | ICD-10-CM | POA: Diagnosis present

## 2023-05-21 DIAGNOSIS — K219 Gastro-esophageal reflux disease without esophagitis: Secondary | ICD-10-CM | POA: Diagnosis present

## 2023-05-21 DIAGNOSIS — E1169 Type 2 diabetes mellitus with other specified complication: Secondary | ICD-10-CM | POA: Diagnosis present

## 2023-05-21 DIAGNOSIS — Z79899 Other long term (current) drug therapy: Secondary | ICD-10-CM

## 2023-05-21 DIAGNOSIS — B9562 Methicillin resistant Staphylococcus aureus infection as the cause of diseases classified elsewhere: Secondary | ICD-10-CM | POA: Diagnosis present

## 2023-05-21 DIAGNOSIS — E1165 Type 2 diabetes mellitus with hyperglycemia: Secondary | ICD-10-CM | POA: Diagnosis present

## 2023-05-21 DIAGNOSIS — E66811 Obesity, class 1: Secondary | ICD-10-CM | POA: Diagnosis present

## 2023-05-21 DIAGNOSIS — Z7984 Long term (current) use of oral hypoglycemic drugs: Secondary | ICD-10-CM | POA: Diagnosis not present

## 2023-05-21 DIAGNOSIS — Z87440 Personal history of urinary (tract) infections: Secondary | ICD-10-CM

## 2023-05-21 LAB — COMPREHENSIVE METABOLIC PANEL
ALT: 25 U/L (ref 0–44)
AST: 13 U/L — ABNORMAL LOW (ref 15–41)
Albumin: 3.1 g/dL — ABNORMAL LOW (ref 3.5–5.0)
Alkaline Phosphatase: 91 U/L (ref 38–126)
Anion gap: 5 (ref 5–15)
BUN: 10 mg/dL (ref 8–23)
CO2: 27 mmol/L (ref 22–32)
Calcium: 9.2 mg/dL (ref 8.9–10.3)
Chloride: 104 mmol/L (ref 98–111)
Creatinine, Ser: 0.4 mg/dL — ABNORMAL LOW (ref 0.44–1.00)
GFR, Estimated: >60 mL/min (ref >60)
Glucose, Bld: 128 mg/dL — ABNORMAL HIGH (ref 70–99)
Potassium: 3.9 mmol/L (ref 3.5–5.1)
Sodium: 136 mmol/L (ref 135–145)
Total Bilirubin: 0.4 mg/dL (ref 0.0–1.2)
Total Protein: 6.6 g/dL (ref 6.5–8.1)

## 2023-05-21 LAB — CBC WITH DIFFERENTIAL/PLATELET
Abs Immature Granulocytes: 0.02 10*3/uL (ref 0.00–0.07)
Basophils Absolute: 0 10*3/uL (ref 0.0–0.1)
Basophils Relative: 0 %
Eosinophils Absolute: 0.1 10*3/uL (ref 0.0–0.5)
Eosinophils Relative: 2 %
HCT: 39.1 % (ref 36.0–46.0)
Hemoglobin: 12.4 g/dL (ref 12.0–15.0)
Immature Granulocytes: 0 %
Lymphocytes Relative: 17 %
Lymphs Abs: 1.1 10*3/uL (ref 0.7–4.0)
MCH: 30.1 pg (ref 26.0–34.0)
MCHC: 31.7 g/dL (ref 30.0–36.0)
MCV: 94.9 fL (ref 80.0–100.0)
Monocytes Absolute: 0.6 10*3/uL (ref 0.1–1.0)
Monocytes Relative: 9 %
Neutro Abs: 4.4 10*3/uL (ref 1.7–7.7)
Neutrophils Relative %: 72 %
Platelets: 200 10*3/uL (ref 150–400)
RBC: 4.12 MIL/uL (ref 3.87–5.11)
RDW: 13.6 % (ref 11.5–15.5)
WBC: 6.2 10*3/uL (ref 4.0–10.5)
nRBC: 0 % (ref 0.0–0.2)

## 2023-05-21 LAB — CBG MONITORING, ED: Glucose-Capillary: 185 mg/dL — ABNORMAL HIGH (ref 70–99)

## 2023-05-21 MED ORDER — VANCOMYCIN HCL IN DEXTROSE 1-5 GM/200ML-% IV SOLN
1000.0000 mg | Freq: Two times a day (BID) | INTRAVENOUS | Status: DC
  Administered 2023-05-22 – 2023-05-23 (×4): 1000 mg via INTRAVENOUS
  Filled 2023-05-21 (×4): qty 200

## 2023-05-21 MED ORDER — TAMSULOSIN HCL 0.4 MG PO CAPS
0.4000 mg | ORAL_CAPSULE | Freq: Every day | ORAL | Status: DC
  Administered 2023-05-22 – 2023-05-24 (×3): 0.4 mg via ORAL
  Filled 2023-05-21 (×3): qty 1

## 2023-05-21 MED ORDER — ONDANSETRON HCL 4 MG/2ML IJ SOLN: 4.0000 mg | Freq: Four times a day (QID) | INTRAMUSCULAR | Status: DC | PRN

## 2023-05-21 MED ORDER — ACETAMINOPHEN 650 MG RE SUPP: 650.0000 mg | Freq: Four times a day (QID) | RECTAL | Status: DC | PRN

## 2023-05-21 MED ORDER — IOHEXOL 300 MG/ML  SOLN
75.0000 mL | Freq: Once | INTRAMUSCULAR | Status: AC | PRN
  Administered 2023-05-21: 75 mL via INTRAVENOUS

## 2023-05-21 MED ORDER — ACETAMINOPHEN 325 MG PO TABS: 650.0000 mg | ORAL_TABLET | Freq: Four times a day (QID) | ORAL | Status: DC | PRN

## 2023-05-21 MED ORDER — MELATONIN 5 MG PO TABS
10.0000 mg | ORAL_TABLET | Freq: Every day | ORAL | Status: DC
  Filled 2023-05-21 (×2): qty 2

## 2023-05-21 MED ORDER — LITHIUM CARBONATE 150 MG PO CAPS
600.0000 mg | ORAL_CAPSULE | Freq: Every day | ORAL | Status: DC
  Administered 2023-05-22 – 2023-05-24 (×3): 600 mg via ORAL
  Filled 2023-05-21 (×3): qty 4

## 2023-05-21 MED ORDER — INSULIN ASPART 100 UNIT/ML IJ SOLN
0.0000 [IU] | Freq: Every day | INTRAMUSCULAR | Status: DC
  Administered 2023-05-23: 3 [IU] via SUBCUTANEOUS
  Administered 2023-05-24: 2 [IU] via SUBCUTANEOUS

## 2023-05-21 MED ORDER — CEFAZOLIN SODIUM-DEXTROSE 2-4 GM/100ML-% IV SOLN
2.0000 g | Freq: Once | INTRAVENOUS | Status: AC
  Administered 2023-05-21: 2 g via INTRAVENOUS
  Filled 2023-05-21: qty 100

## 2023-05-21 MED ORDER — SODIUM CHLORIDE 0.9 % IV SOLN
2.0000 g | INTRAVENOUS | Status: DC
  Administered 2023-05-21 – 2023-05-22 (×2): 2 g via INTRAVENOUS
  Filled 2023-05-21 (×2): qty 20

## 2023-05-21 MED ORDER — LACTULOSE 10 GM/15ML PO SOLN
20.0000 g | Freq: Two times a day (BID) | ORAL | Status: DC
  Administered 2023-05-22 – 2023-05-24 (×6): 20 g via ORAL
  Filled 2023-05-21 (×6): qty 30

## 2023-05-21 MED ORDER — QUETIAPINE FUMARATE 25 MG PO TABS
200.0000 mg | ORAL_TABLET | Freq: Every day | ORAL | Status: DC
  Administered 2023-05-22 – 2023-05-24 (×4): 200 mg via ORAL
  Filled 2023-05-21 (×3): qty 8
  Filled 2023-05-21: qty 2

## 2023-05-21 MED ORDER — LUBIPROSTONE 24 MCG PO CAPS
24.0000 ug | ORAL_CAPSULE | Freq: Two times a day (BID) | ORAL | Status: DC
  Administered 2023-05-22 – 2023-05-24 (×5): 24 ug via ORAL
  Filled 2023-05-21 (×7): qty 1

## 2023-05-21 MED ORDER — METHIMAZOLE 5 MG PO TABS
5.0000 mg | ORAL_TABLET | Freq: Two times a day (BID) | ORAL | Status: DC
  Administered 2023-05-22 – 2023-05-24 (×6): 5 mg via ORAL
  Filled 2023-05-21 (×6): qty 1

## 2023-05-21 MED ORDER — INSULIN ASPART 100 UNIT/ML IJ SOLN
0.0000 [IU] | Freq: Three times a day (TID) | INTRAMUSCULAR | Status: DC
  Administered 2023-05-22 (×2): 3 [IU] via SUBCUTANEOUS
  Administered 2023-05-22: 2 [IU] via SUBCUTANEOUS
  Administered 2023-05-23: 5 [IU] via SUBCUTANEOUS
  Administered 2023-05-23: 2 [IU] via SUBCUTANEOUS
  Administered 2023-05-23: 5 [IU] via SUBCUTANEOUS
  Administered 2023-05-24 (×3): 3 [IU] via SUBCUTANEOUS

## 2023-05-21 MED ORDER — LITHIUM CARBONATE 150 MG PO CAPS
300.0000 mg | ORAL_CAPSULE | Freq: Every day | ORAL | Status: DC
  Administered 2023-05-22 – 2023-05-24 (×4): 300 mg via ORAL
  Filled 2023-05-21 (×4): qty 2

## 2023-05-21 MED ORDER — OXYCODONE-ACETAMINOPHEN 5-325 MG PO TABS
2.0000 | ORAL_TABLET | Freq: Once | ORAL | Status: AC
  Administered 2023-05-21: 2 via ORAL
  Filled 2023-05-21: qty 2

## 2023-05-21 MED ORDER — ONDANSETRON HCL 4 MG PO TABS: 4.0000 mg | ORAL_TABLET | Freq: Four times a day (QID) | ORAL | Status: DC | PRN

## 2023-05-21 MED ORDER — QUETIAPINE FUMARATE 25 MG PO TABS
50.0000 mg | ORAL_TABLET | Freq: Every day | ORAL | Status: DC
  Administered 2023-05-22 – 2023-05-24 (×3): 50 mg via ORAL
  Filled 2023-05-21 (×3): qty 2

## 2023-05-21 MED ORDER — MORPHINE SULFATE (PF) 4 MG/ML IV SOLN
4.0000 mg | INTRAVENOUS | Status: DC | PRN
  Administered 2023-05-21 – 2023-05-22 (×2): 4 mg via INTRAVENOUS
  Filled 2023-05-21 (×2): qty 1

## 2023-05-21 MED ORDER — POLYETHYLENE GLYCOL 3350 17 G PO PACK
17.0000 g | PACK | Freq: Every day | ORAL | Status: DC | PRN
Start: 2023-05-21 — End: 2023-05-25

## 2023-05-21 MED ORDER — VANCOMYCIN HCL IN DEXTROSE 1-5 GM/200ML-% IV SOLN: 1000.0000 mg | Freq: Once | INTRAVENOUS | Status: DC

## 2023-05-21 MED ORDER — VANCOMYCIN HCL IN DEXTROSE 1-5 GM/200ML-% IV SOLN
1000.0000 mg | Freq: Once | INTRAVENOUS | Status: AC
  Administered 2023-05-22: 1000 mg via INTRAVENOUS
  Filled 2023-05-21: qty 200

## 2023-05-21 MED ORDER — GABAPENTIN 300 MG PO CAPS
300.0000 mg | ORAL_CAPSULE | Freq: Two times a day (BID) | ORAL | Status: DC
  Administered 2023-05-22 – 2023-05-24 (×7): 300 mg via ORAL
  Filled 2023-05-21 (×7): qty 1

## 2023-05-21 NOTE — Assessment & Plan Note (Signed)
 Hold Eliquis for today pending Ortho eval

## 2023-05-21 NOTE — H&P (Signed)
 History and Physical    [name] FMW:[id number] DOB: April 15, 1956 DOA: 05/21/2023  PCP: Patient, [name] Per   Patient coming from: [facility]  I have personally briefly reviewed patient's old medical records in [facility]  Chief Complaint: Left foot pain and swelling  HPI: [name] is a 68 y.o. female with medical history significant for diabetes mellitus, DVT, on chronic anticoagulation, hypertension, stage II lung cancer. Patient presented to the ED with complaints of redness pain and swelling to the big toe of her left foot, symptoms started a week ago, but reports started looking worse over the past 24 hours.  She was started on doxycycline  2 days ago.  She reports generalized pain that is unchanged from baseline for which she is on chronic pain medications every 4 hours.  She denies any other symptoms.  12/17 to 05/07/2023 - recent hospitalization for severe sepsis secondary to ESBL E. coli UTI, with E. coli bacteremia.  Also obstipation/severe opioid-induced constipation with stercoral colitis.   ED Course:  Temp max 98.8.  Heart rate 85-91.  Respiratory 20.  Blood pressure systolic 115-119.  O2 sats greater than 93% on room air. WBC 6.2. X-ray negative for acute abnormality osteomyelitis.  IV cefazolin  started Hospitalist to admit for cellulitis.  Review of Systems: As per HPI all other systems reviewed and negative.  Past Medical History:  Diagnosis Date   Allergy    Bipolar disorder (HCC)    Depression    Drug abuse (HCC)    history of, went to rehab 2015   GERD (gastroesophageal reflux disease)    Lung cancer (HCC)    lung ca dx 11/11- right upper lobe    Past Surgical History:  Procedure Laterality Date   ABDOMINAL AORTOGRAM W/LOWER EXTREMITY Bilateral 01/10/2022   Procedure: ABDOMINAL AORTOGRAM W/LOWER EXTREMITY;  Surgeon: Serene Gaile ORN, MD;  Location: [facility];  Service: Cardiovascular;  Laterality: Bilateral;   ANKLE FRACTURE  SURGERY Left    HARDWARE REMOVAL Left 12/13/2021   Procedure: LEFT ANKLE HARDWARE REMOVAL;  Surgeon: Sheril Coy, MD;  Location: [facility];  Service: Orthopedics;  Laterality: Left;   LUNG LOBECTOMY Right 2011   RUL removed for lung cancer   STYLOID PROCESS EXCISION Left 10/16/2014   Procedure: EXCISION LEFT STYLOID PROCESS;  Surgeon: Lonni FORBES Angle, MD;  Location: [facility];  Service: ENT;  Laterality: Left;   TONSILLECTOMY Bilateral 10/16/2014   Procedure: BILATERAL TONSILLECTOMY;  Surgeon: Lonni FORBES Angle, MD;  Location: [facility];  Service: ENT;  Laterality: Bilateral;   TUBAL LIGATION  1984     reports that she has quit smoking. Her smoking use included cigarettes. She has a 40 pack-year smoking history. She has never used smokeless tobacco. She reports that she does not currently use alcohol. She reports that she does not currently use drugs after having used the following drugs: Benzodiazepines.  Allergies  Allergen Reactions   Sulfa Antibiotics Swelling and Rash    Neck swelling    Family History  Problem Relation Age of Onset   Cancer Mother        breast cancer   Cancer Father        stomach cancer   Diabetes Father    Cancer Sister        breast cancer   Diabetes Brother    Diabetes Brother    Cancer Sister        breast cancer    Prior  to Admission medications   Medication Sig Start Date End Date Taking? Authorizing Provider  acetaminophen  (TYLENOL ) 325 MG tablet Take 650 mg by mouth every 4 (four) hours as needed for mild pain.   Yes [provider]  ascorbic acid  (VITAMIN C ) 500 MG tablet Take 500 mg by mouth 2 (two) times daily.   Yes [provider]  bisacodyl  (DULCOLAX) 10 MG suppository Place 10 mg rectally as needed for moderate constipation.   Yes [provider]  calcium  carbonate (TUMS - DOSED IN MG ELEMENTAL CALCIUM ) 500 MG chewable tablet Chew 1 tablet by mouth every 5 (five) hours as  needed for indigestion or heartburn.   Yes [provider]  cetirizine (ZYRTEC) 10 MG tablet Take 10 mg by mouth daily. For allergies   Yes [provider]  diclofenac  Sodium (VOLTAREN ) 1 % GEL Apply 2 g topically 4 (four) times daily.   Yes [provider]  doxycycline  (MONODOX ) 100 MG capsule Take 100 mg by mouth 2 (two) times daily. 05/18/23 05/25/23 Yes [provider]  ELIQUIS  5 MG TABS tablet Take 5 mg by mouth 2 (two) times daily. 01/18/23  Yes [provider]  folic acid  (FOLVITE ) 1 MG tablet Take 1 mg by mouth daily.   Yes [provider]  gabapentin  (NEURONTIN ) 300 MG capsule Take 300 mg by mouth 2 (two) times daily. 07/20/21 06/01/23 Yes [provider]  insulin  aspart (FIASP  FLEXTOUCH) 100 UNIT/ML FlexTouch Pen Inject 0-10 Units into the skin 4 (four) times daily -  before meals and at bedtime. Short acting insulin  (Aspart) per sliding scale 0-10 ---:-- Insulin  injection 0-10 Units 0-10 Units Subcutaneous, 3 times daily with meals CBG 70 - 120: 0 unit CBG 121 - 150: 0 unit  CBG 151 - 200: 1 unit CBG 201 - 250: 2 units CBG 251 - 300: 4 units CBG 301 - 350: 6 units  CBG 351 - 400: 8 units  CBG > 400: 10 units 02/16/23  Yes Cyndal Kasson, Courage, MD  Insulin  Glargine (BASAGLAR  KWIKPEN) 100 UNIT/ML Inject 10 Units into the skin daily. 02/22/23  Yes Verdene Purchase, MD  LACTOBACILLUS PO Take 1 capsule by mouth daily.   Yes [provider]  lactulose  (CHRONULAC ) 10 GM/15ML solution Take 30 mLs by mouth 2 (two) times daily. 01/18/23  Yes [provider]  lactulose  (CHRONULAC ) 10 GM/15ML solution Take 10 g by mouth daily as needed for moderate constipation.   Yes [provider]  lidocaine  (LIDODERM ) 5 % Place 1 patch onto the skin daily. Remove & Discard patch within 12 hours or as directed by MD; Apply to LOWER LEG   Yes [provider]  lithium  600 MG capsule Take 600 mg by mouth daily. 11/24/22  Yes [provider]  lithium  carbonate 300 MG capsule Take 300 mg by mouth at bedtime.   Yes [provider]  loperamide  (IMODIUM  A-D) 2 MG tablet Take 2 mg by mouth every 6 (six) hours as needed for diarrhea or loose stools.   Yes [provider]  lubiprostone  (AMITIZA ) 24 MCG capsule Take 24 mcg by mouth 2 (two) times daily with a meal.   Yes [provider]  Melatonin 10 MG TABS Take 10 mg by mouth at bedtime.   Yes [provider]  metFORMIN  (GLUCOPHAGE ) 500 MG tablet Take 1 pill a day Patient taking differently: Take 500 mg by mouth daily with breakfast. Take 1 pill a day 12/17/22  Yes Zammit, Joseph, MD  methimazole  (TAPAZOLE ) 5 MG tablet Take 1 tablet (5 mg total) by mouth 2 (two) times daily. 02/16/23  Yes Norleen Xie, Courage, MD  Multiple Vitamin (MULTIVITAMIN ADULT PO) Take 1 tablet by mouth daily.   Yes [provider]  ondansetron  (ZOFRAN ) 4 MG tablet Take 4 mg by mouth every 8 (eight) hours as needed. 12/14/22  Yes [provider]  oxyCODONE -acetaminophen  (PERCOCET) 10-325 MG tablet Take 1 tablet by mouth every 6 (six) hours as needed for pain. Patient taking differently: Take 1 tablet by mouth every 4 (four) hours as needed for pain. 05/07/23  Yes Johnson, Clanford L, MD  phenol (CHLORASEPTIC) 1.4 % LIQD Use as directed 5 sprays in the mouth or throat every 2 (two) hours as needed for throat irritation / pain.   Yes [provider]  polyethylene glycol (MIRALAX  / GLYCOLAX ) 17 g packet Take 17 g by mouth 2 (two) times daily. 05/07/23  Yes Johnson, Clanford L, MD  QUEtiapine  (SEROQUEL ) 100 MG tablet Take 200 mg by mouth at bedtime.   Yes [provider]  QUEtiapine  (SEROQUEL ) 25 MG tablet Take 50 mg by mouth daily.   Yes [provider]  tamsulosin  (FLOMAX ) 0.4 MG CAPS capsule Take 0.4 mg by mouth daily.   Yes [provider]  triamcinolone (KENALOG) 0.025 % cream Apply 1 Application topically 2 (two) times  daily. Apply to R ear external canal for dermatitis for 4 weeks.   Yes [provider]  ARIPiprazole (ABILIFY) 10 MG tablet Take 10 mg by mouth daily.  04/20/23  [provider]    Physical Exam: Vitals:   05/21/23 1440 05/21/23 1915 05/21/23 1917  BP: 115/66 119/70   Pulse: 91  85  Resp: 20 20   Temp: 98.4 F (36.9 C) 98.8 F (37.1 C)   TempSrc:  Oral   SpO2: 98%  93%    Constitutional: Tearful from uncontrolled chronic body pain, Vitals:   05/21/23 1440 05/21/23 1915 05/21/23 1917  BP: 115/66 119/70   Pulse: 91  85  Resp: 20 20   Temp: 98.4 F (36.9 C) 98.8 F (37.1 C)   TempSrc:  Oral   SpO2: 98%  93%   Eyes: PERRL, lids and conjunctivae normal ENMT: Mucous membranes are moist.   Neck: normal, supple, no masses, no thyromegaly Respiratory: clear to auscultation bilaterally, no wheezing, no crackles. Normal respiratory effort. No accessory muscle use.  Cardiovascular: Regular rate and rhythm, no murmurs / rubs / gallops. No extremity edema.  Abdomen: no tenderness, no masses palpated. No hepatosplenomegaly. Bowel sounds positive.  Musculoskeletal: no clubbing / cyanosis. No joint deformity upper and lower extremities. Good ROM, no contractures. Normal muscle tone.  Skin: Redness erythema and tenderness to big toe of left foot, with erythema still extending towards distal foot.  No rashes, lesions, ulcers. No induration Neurologic: No facial asymmetry, speech fluent, moving extremities spontaneously.  Psychiatric: Normal judgment and insight. Alert and oriented x 3. Normal mood.        Labs on Admission: I have personally reviewed following labs and imaging studies  CBC: Recent Labs  Lab 05/21/23 1650  WBC 6.2  NEUTROABS 4.4  HGB 12.4  HCT 39.1  MCV 94.9  PLT 200   Basic Metabolic Panel: Recent Labs  Lab 05/21/23 1650  NA 136  K 3.9  CL 104  CO2 27  GLUCOSE 128*  BUN 10  CREATININE 0.40*  CALCIUM  9.2   GFR: CrCl cannot be  calculated (Unknown ideal weight.). Liver  Function Tests: Recent Labs  Lab 05/21/23 1650  AST 13*  ALT 25  ALKPHOS 91  BILITOT 0.4  PROT 6.6  ALBUMIN 3.1*    Radiological Exams on Admission: CT FOOT LEFT W CONTRAST Result Date: 05/21/2023 CLINICAL DATA:  Diabetic foot infection with swelling and erythema and pain. First big toe cellulitis. EXAM: CT OF THE LOWER LEFT EXTREMITY WITH CONTRAST TECHNIQUE: Multidetector CT imaging of the lower left extremity was performed according to the standard protocol following intravenous contrast administration. RADIATION DOSE REDUCTION: This exam was performed according to the departmental dose-optimization program which includes automated exposure control, adjustment of the mA and/or kV according to patient size and/or use of iterative reconstruction technique. CONTRAST:  75mL OMNIPAQUE  IOHEXOL  300 MG/ML  SOLN COMPARISON:  Radiographs 05/21/2023 FINDINGS: Bones/Joint/Cartilage Severe demineralization limits sensitivity for subtle fractures and osteomyelitis. No evidence of acute fracture or osteomyelitis. Ligaments Suboptimally assessed by CT. Muscles and Tendons Marked atrophy.  No acute abnormality. Soft tissues Soft tissue swelling about the dorsum of the foot on the big toe. No drainable fluid collection. No soft tissue gas. IMPRESSION: 1. Soft tissue swelling about the dorsum of the foot and big toe is nonspecific but can be seen with cellulitis. No abscess. 2. Severe demineralization limits sensitivity for subtle fractures and osteomyelitis. No evidence of acute fracture or osteomyelitis. If there is ongoing concern for osteomyelitis MRI is recommended Electronically Signed   By: Norman Gatlin M.D.   On: 05/21/2023 21:08   DG Foot Complete Left Result Date: 05/21/2023 CLINICAL DATA:  pain, redness, left great toe Possible foot injury or infection. EXAM: LEFT FOOT - COMPLETE 3+ VIEW COMPARISON:  Radiographs 02/18/2023 FINDINGS: The bones are severely  demineralized. There is no evidence of acute fracture, dislocation or bone destruction. Stable postsurgical changes in the distal fibula. Stable small posterior calcaneal spur. No focal soft tissue abnormalities are identified. IMPRESSION: Stable radiographic appearance of the left foot. Severe osseous demineralization without evidence of acute fracture or osteomyelitis. Electronically Signed   By: Elsie Perone M.D.   On: 05/21/2023 16:50    EKG: None  Assessment/Plan Principal Problem:   Cellulitis of left toe Active Problems:   MDD (major depressive disorder), recurrent severe, without psychosis (HCC)   DVT (deep venous thrombosis) 09/08/22   HTN (hypertension)   Diabetes (HCC)   Assessment and Plan: * Cellulitis of left toe Of 1 week duration. Rules out for sepsis.  Afebrile, WBC 6.2.  Cellulitis to first big toe on left foot.  Denies initial trauma to foot.  X-ray negative for acute abnormality. - Obtain CT foot left with contrast- soft tissue swelling about the dorsum of the foot and big toe, but can be seen with cellulitis. No abscess  -IV vancomycin  and ceftriaxone  -Ortho consult in a.m. - IV morphine  -Recent hospitalization for ESBL E. coli UTI and bacteremia, treated with meropenem .  ??unlikely gram-negative is the etiology for cellulitis, but if no improvement in symptoms considering she is diabetic, may need to consider broader -spectrum antibiotics.  Diabetes (HCC) Controlled.  A1c 10.9. - SSI- M -Hold home Lantus  10 units daily -Hold home metformin   HTN (hypertension) Stable.  Not on antihypertensives.   Also on methimazole , ?? Hyperthyrodism- not documented in chart.  DVT (deep venous thrombosis) 09/08/22 Hold Eliquis  for today pending Ortho eval  MDD (major depressive disorder), recurrent severe, without psychosis (HCC) Resume quetiapine , lithium    DVT prophylaxis: SCDs for now pending Ortho eval Code Status: FULL code.  Documentation at bedside from  nursing  home not stating otherwise. Family Communication:None at bedside Disposition Plan: > 2 days, nursing home resident Consults called: Ortho Admission status: Inpt Med-surg I certify that at the point of admission it is my clinical judgment that the patient will require inpatient hospital care spanning beyond 2 midnights from the point of admission due to high intensity of service, high risk for further deterioration and high frequency of surveillance required.  Author: Tully FORBES Carwin, MD 05/21/2023 10:50 PM  For on call review [email or web address].

## 2023-05-21 NOTE — ED Triage Notes (Signed)
 Pt brought in by EMS for possible foot injury or infection, facility states they have been cleaning toe with soap and water but it continues to get worse. When asked pt states it has been bad for about a week and looking bad for about a day.

## 2023-05-21 NOTE — ED Notes (Signed)
 Pericare performed, clean linen applied. Update provided. Vitals reassessed. Pt upset about wait and care. Bed locked in lowest position and call button within reach.  [place] RN

## 2023-05-21 NOTE — ED Provider Notes (Signed)
 [place] EMERGENCY DEPARTMENT AT [facility] Provider Note   CSN: [id number] Arrival date & time: 05/21/23  1416     History  Chief Complaint  Patient presents with   Toe Injury    Pt reports she has been having issues with her toes for about a week, and it has been looking the way it looks now for about one day. No injury to foot (pt does not walk).     [name] is a 68 y.o. female.  HPI     [name] is a 68 y.o. female currently resides at [facility], past medical history of type 2 diabetes, DVT diagnosed in April 2024 currently taking Eliquis , hypertension, remote history of lung cancer, sent to the ER for evaluation of left great toe pain redness and swelling.  Patient notes pain to her toe has been gradually worsening for one week.  No known injury.  She states facility has been cleaning with soap and water.  She has been taking doxycycline  100 mg twice daily for 2 days without improvement.  She denies fever or chills.  No pain redness or swelling of her leg.  Home Medications Prior to Admission medications   Medication Sig Start Date End Date Taking? Authorizing Provider  acetaminophen  (TYLENOL ) 325 MG tablet Take 650 mg by mouth every 4 (four) hours as needed for mild pain.    [provider]  ascorbic acid  (VITAMIN C ) 500 MG tablet Take 500 mg by mouth 2 (two) times daily.    [provider]  bisacodyl  (DULCOLAX) 10 MG suppository Place 10 mg rectally as needed for moderate constipation.    [provider]  calcium  carbonate (TUMS - DOSED IN MG ELEMENTAL CALCIUM ) 500 MG chewable tablet Chew 1 tablet by mouth every 5 (five) hours as needed for indigestion or heartburn.    [provider]  cetirizine (ZYRTEC) 10 MG tablet Take 10 mg by mouth daily. For allergies    [provider]  diclofenac  Sodium (VOLTAREN ) 1 % GEL Apply 2 g topically 4 (four) times daily.    [provider]  ELIQUIS  5 MG  TABS tablet Take 5 mg by mouth 2 (two) times daily. 01/18/23   [provider]  folic acid  (FOLVITE ) 1 MG tablet Take 1 mg by mouth daily.    [provider]  gabapentin  (NEURONTIN ) 100 MG capsule Take 300 mg by mouth 3 (three) times daily. 07/20/21   [provider]  insulin  aspart (FIASP  FLEXTOUCH) 100 UNIT/ML FlexTouch Pen Inject 0-10 Units into the skin 4 (four) times daily -  before meals and at bedtime. Short acting insulin  (Aspart) per sliding scale 0-10 ---:-- Insulin  injection 0-10 Units 0-10 Units Subcutaneous, 3 times daily with meals CBG 70 - 120: 0 unit CBG 121 - 150: 0 unit  CBG 151 - 200: 1 unit CBG 201 - 250: 2 units CBG 251 - 300: 4 units CBG 301 - 350: 6 units  CBG 351 - 400: 8 units  CBG > 400: 10 units 02/16/23   Emokpae, Courage, MD  Insulin  Glargine (BASAGLAR  KWIKPEN) 100 UNIT/ML Inject 10 Units into the skin daily. 02/22/23   Krishnan, Gokul, MD  LACTOBACILLUS PO Take 1 capsule by mouth daily.    [provider]  lactulose  (CHRONULAC ) 10 GM/15ML solution Take 30 mLs by mouth daily as needed for moderate constipation. 01/18/23   [provider]  lidocaine  (LIDODERM ) 5 % Place 1 patch onto the skin  daily. Remove & Discard patch within 12 hours or as directed by MD; Apply to LOWER LEG    [provider]  linaclotide  (LINZESS ) 290 MCG CAPS capsule Take 1 capsule (290 mcg total) by mouth daily before breakfast. 05/08/23   Johnson, Clanford L, MD  lithium  600 MG capsule Take 600 mg by mouth daily. 11/24/22   [provider]  lithium  carbonate 300 MG capsule Take 300 mg by mouth at bedtime.    [provider]  loperamide  (IMODIUM  A-D) 2 MG tablet Take 2 mg by mouth every 6 (six) hours as needed for diarrhea or loose stools.    [provider]  Melatonin 10 MG TABS Take 10 mg by mouth at bedtime.    [provider]  metFORMIN  (GLUCOPHAGE ) 500 MG tablet Take 1 pill a day Patient taking differently: Take 500  mg by mouth at bedtime. Take 1 pill a day 12/17/22   Zammit, Joseph, MD  methimazole  (TAPAZOLE ) 5 MG tablet Take 1 tablet (5 mg total) by mouth 2 (two) times daily. 02/16/23   Pearlean Manus, MD  Multiple Vitamin (MULTIVITAMIN ADULT PO) Take 1 tablet by mouth daily.    [provider]  ondansetron  (ZOFRAN ) 4 MG tablet Take 4 mg by mouth every 8 (eight) hours as needed. 12/14/22   [provider]  oxyCODONE -acetaminophen  (PERCOCET) 10-325 MG tablet Take 1 tablet by mouth every 6 (six) hours as needed for pain. 05/07/23   Johnson, Clanford L, MD  phenol (CHLORASEPTIC) 1.4 % LIQD Use as directed 5 sprays in the mouth or throat every 2 (two) hours as needed for throat irritation / pain.    [provider]  polyethylene glycol (MIRALAX  / GLYCOLAX ) 17 g packet Take 17 g by mouth 2 (two) times daily. 05/07/23   Johnson, Clanford L, MD  QUEtiapine  (SEROQUEL ) 100 MG tablet Take 100 mg by mouth at bedtime.    [provider]  QUEtiapine  (SEROQUEL ) 25 MG tablet Take 25 mg by mouth daily.    [provider]  tamsulosin  (FLOMAX ) 0.4 MG CAPS capsule Take 0.4 mg by mouth daily.    [provider]  ARIPiprazole (ABILIFY) 10 MG tablet Take 10 mg by mouth daily.  04/20/23  [provider]      Allergies    Sulfa antibiotics    Review of Systems   Review of Systems  Constitutional:  Negative for appetite change, chills and fever.  Respiratory:  Negative for cough and shortness of breath.   Cardiovascular:  Negative for chest pain.  Gastrointestinal:  Negative for abdominal pain, nausea and vomiting.  Musculoskeletal:  Positive for arthralgias (Left foot great toe pain).  Skin:  Positive for color change and wound.  Neurological:  Negative for dizziness, seizures, weakness, numbness and headaches.    Physical Exam Updated Vital Signs BP 115/66 (BP Location: Right Arm)   Pulse 91   Temp 98.4 F (36.9 C)   Resp 20   SpO2 98%  Physical  Exam Vitals and nursing note reviewed.  Constitutional:      General: She is not in acute distress.    Appearance: Normal appearance. She is not ill-appearing or toxic-appearing.  Cardiovascular:     Rate and Rhythm: Normal rate and regular rhythm.     Pulses: Normal pulses.  Pulmonary:     Effort: Pulmonary effort is normal.  Musculoskeletal:        General: Tenderness present. No swelling.  Skin:    General: Skin is  warm.     Capillary Refill: Capillary refill takes less than 2 seconds.     Findings: Erythema present.     Comments: Erythema with swelling left great toe.  Mild fluctuance located near the eponychium and along the medial nail fold area.  No drainage.  See attached photo  Neurological:     General: No focal deficit present.     Mental Status: She is alert.     Sensory: No sensory deficit.     Motor: No weakness.        ED Results / Procedures / Treatments   Labs (all labs ordered are listed, but only abnormal results are displayed) Labs Reviewed  COMPREHENSIVE METABOLIC PANEL - Abnormal; Notable for the following components:      Result Value   Glucose, Bld 128 (*)    Creatinine, Ser 0.40 (*)    Albumin 3.1 (*)    AST 13 (*)    All other components within normal limits  CBC WITH DIFFERENTIAL/PLATELET    EKG None  Radiology DG Foot Complete Left Result Date: 05/21/2023 CLINICAL DATA:  pain, redness, left great toe Possible foot injury or infection. EXAM: LEFT FOOT - COMPLETE 3+ VIEW COMPARISON:  Radiographs 02/18/2023 FINDINGS: The bones are severely demineralized. There is no evidence of acute fracture, dislocation or bone destruction. Stable postsurgical changes in the distal fibula. Stable small posterior calcaneal spur. No focal soft tissue abnormalities are identified. IMPRESSION: Stable radiographic appearance of the left foot. Severe osseous demineralization without evidence of acute fracture or osteomyelitis. Electronically Signed   By: Elsie Perone M.D.   On: 05/21/2023 16:50    Procedures Procedures    Medications Ordered in ED Medications - No data to display  ED Course/ Medical Decision Making/ A&P                                 Medical Decision Making Diabetic patient here with redness swelling and pain of left great toe.  Symptoms gradually worsening x 1 week.  She currently resides at a local SNF facility.  Anticoagulated on Eliquis  for DVT diagnosed in April 2024.  She was prescribed doxycycline , has been taking twice daily for the last 2 days without improvement of her symptoms.  Sent here for further evaluation  I suspect infection of the toe secondary to diabetes. Pt non toxic appearing.  Cellulitis, abscess, osteomyelitis all considered patient nontoxic appearing.  Amount and/or Complexity of Data Reviewed Labs: ordered.    Details: Labs interpreted by me, no evidence of leukocytosis, chemistries without significant derangement. Radiology: ordered.    Details: X-ray of the left foot shows severe osseous demineralization without evidence of fracture or osteomyelitis Discussion of management or test interpretation with external provider(s): Patient here with suspected diabetic wound of the toe.  Labs are reassuring, but failing outpatient antibiotics.  IV Ancef  given here, will consult with hospitalist for admission, may need MRI of toe  Discussed with [facility] hospitalist, agrees to admit  Risk Prescription drug management. Decision regarding hospitalization.           Final Clinical Impression(s) / ED Diagnoses Final diagnoses:  Cellulitis of toe of left foot    Rx / DC Orders ED Discharge Orders     None         Herlinda Madelin RIGGERS 05/21/23 1936    Francesca Elsie CROME, MD 05/21/23 2336

## 2023-05-21 NOTE — Progress Notes (Signed)
 Pharmacy Antibiotic Note  [name] is a 68 y.o. female admitted on 05/21/2023 with concern for diabetic foot infection. Pharmacy has been consulted for vancomycin  dosing.  Plan: Vancomycin  2g load followed by vancomycin  1g q12h (eAUC 408, Scr 0.8) F/u renal function, infectious work up, and length of therapy Vancomycin  levels as needed    Temp (24hrs), Avg:98.6 F (37 C), Min:98.4 F (36.9 C), Max:98.8 F (37.1 C)  Recent Labs  Lab 05/21/23 1650  WBC 6.2  CREATININE 0.40*    CrCl cannot be calculated (Unknown ideal weight.).    Allergies  Allergen Reactions   Sulfa Antibiotics Swelling and Rash    Neck swelling    Antimicrobials this admission: Vancomycin  1/6 > Cefazolin  1/6  Ceftriaxone  1/6 >  Thank you for allowing pharmacy to be a part of this patient's care.  Leonor GORMAN Bash 05/21/2023 11:09 PM

## 2023-05-21 NOTE — ED Notes (Signed)
 [place] provider. Kellogg RN

## 2023-05-21 NOTE — Assessment & Plan Note (Signed)
 Resume quetiapine, lithium

## 2023-05-21 NOTE — Assessment & Plan Note (Addendum)
 Of 1 week duration. Rules out for sepsis.  Afebrile, WBC 6.2.  Cellulitis to first big toe on left foot.  Denies initial trauma to foot.  X-ray negative for acute abnormality. - Obtain CT foot left with contrast- soft tissue swelling about the dorsum of the foot and big toe, but can be seen with cellulitis. No abscess  -IV vancomycin  and ceftriaxone  -Ortho consult in a.m. - IV morphine  -Recent hospitalization for ESBL E. coli UTI and bacteremia, treated with meropenem .  ??unlikely gram-negative is the etiology for cellulitis, but if no improvement in symptoms considering she is diabetic, may need to consider broader -spectrum antibiotics.

## 2023-05-21 NOTE — Assessment & Plan Note (Addendum)
 Stable.  Not on antihypertensives.   Also on methimazole, ?? Hyperthyrodism- not documented in chart.

## 2023-05-21 NOTE — Assessment & Plan Note (Signed)
 Controlled.  A1c 10.9. - SSI- M -Hold home Lantus 10 units daily -Hold home metformin

## 2023-05-22 ENCOUNTER — Inpatient Hospital Stay (HOSPITAL_COMMUNITY): Payer: Medicare Other

## 2023-05-22 DIAGNOSIS — L02612 Cutaneous abscess of left foot: Secondary | ICD-10-CM | POA: Diagnosis not present

## 2023-05-22 DIAGNOSIS — L03032 Cellulitis of left toe: Secondary | ICD-10-CM | POA: Diagnosis not present

## 2023-05-22 LAB — GLUCOSE, CAPILLARY
Glucose-Capillary: 188 mg/dL — ABNORMAL HIGH (ref 70–99)
Glucose-Capillary: 186 mg/dL — ABNORMAL HIGH (ref 70–99)

## 2023-05-22 LAB — CBG MONITORING, ED
Glucose-Capillary: 158 mg/dL — ABNORMAL HIGH (ref 70–99)
Glucose-Capillary: 148 mg/dL — ABNORMAL HIGH (ref 70–99)

## 2023-05-22 LAB — BASIC METABOLIC PANEL
Anion gap: 5 (ref 5–15)
BUN: 10 mg/dL (ref 8–23)
CO2: 29 mmol/L (ref 22–32)
Calcium: 9.1 mg/dL (ref 8.9–10.3)
Chloride: 101 mmol/L (ref 98–111)
Creatinine, Ser: 0.41 mg/dL — ABNORMAL LOW (ref 0.44–1.00)
GFR, Estimated: >60 mL/min (ref >60)
Glucose, Bld: 156 mg/dL — ABNORMAL HIGH (ref 70–99)
Potassium: 3.9 mmol/L (ref 3.5–5.1)
Sodium: 135 mmol/L (ref 135–145)

## 2023-05-22 LAB — CBC
HCT: 39.7 % (ref 36.0–46.0)
Hemoglobin: 12.2 g/dL (ref 12.0–15.0)
MCH: 29.4 pg (ref 26.0–34.0)
MCHC: 30.7 g/dL (ref 30.0–36.0)
MCV: 95.7 fL (ref 80.0–100.0)
Platelets: 207 10*3/uL (ref 150–400)
RBC: 4.15 MIL/uL (ref 3.87–5.11)
RDW: 13.5 % (ref 11.5–15.5)
WBC: 5.7 10*3/uL (ref 4.0–10.5)
nRBC: 0 % (ref 0.0–0.2)

## 2023-05-22 MED ORDER — MELATONIN 3 MG PO TABS
9.0000 mg | ORAL_TABLET | Freq: Every day | ORAL | Status: DC
  Administered 2023-05-22 – 2023-05-24 (×3): 9 mg via ORAL
  Filled 2023-05-22 (×3): qty 3

## 2023-05-22 MED ORDER — LIDOCAINE HCL (PF) 1 % IJ SOLN
INTRAMUSCULAR | Status: AC
  Filled 2023-05-22: qty 30

## 2023-05-22 MED ORDER — MORPHINE SULFATE (PF) 4 MG/ML IV SOLN
4.0000 mg | INTRAVENOUS | Status: DC | PRN
  Administered 2023-05-22 – 2023-05-23 (×4): 4 mg via INTRAVENOUS
  Filled 2023-05-22 (×4): qty 1

## 2023-05-22 MED ORDER — OXYCODONE-ACETAMINOPHEN 5-325 MG PO TABS
1.0000 | ORAL_TABLET | Freq: Four times a day (QID) | ORAL | Status: DC | PRN
  Administered 2023-05-22 – 2023-05-23 (×4): 1 via ORAL
  Filled 2023-05-22 (×4): qty 1

## 2023-05-22 MED ORDER — LIDOCAINE HCL (PF) 1 % IJ SOLN: 5.0000 mL | Freq: Once | INTRAMUSCULAR | Status: DC

## 2023-05-22 MED ORDER — OXYCODONE HCL 5 MG PO TABS
5.0000 mg | ORAL_TABLET | Freq: Four times a day (QID) | ORAL | Status: DC | PRN
  Administered 2023-05-22 – 2023-05-23 (×4): 5 mg via ORAL
  Filled 2023-05-22 (×4): qty 1

## 2023-05-22 MED ORDER — OXYCODONE-ACETAMINOPHEN 10-325 MG PO TABS: 1.0000 | ORAL_TABLET | Freq: Four times a day (QID) | ORAL | Status: DC | PRN

## 2023-05-22 NOTE — Procedures (Signed)
 Bedside procedure note   Preoperative Diagnosis: Left great toe abscess, cellulitis of left great toe Postoperative Diagnosis: Left great toe abscess, cellulitis of left great toe   Procedure(s) Performed: Incision and drainage of left great toe abscess   Performing provider: Dorothyann Brittle, DO   Estimated Blood Loss: Minimal   Findings: Left great toe abscess with moderate purulent drainage noted    Procedure: At bedside, left great toe was prepped with Betadine and draped with OR towels.  Verbal consent obtained from the patient prior to beginning of procedure.  Timeout was performed.  Using scalpel, a linear incision was made over top of the abscess.  Wound culture was obtained.  There was a moderate amount of purulent drainage.  Skin over the abscess was sloughing, so this was excised with scissors.  This was extended along the medial aspect of the nailbed and just posterior to the nail at the tip of the toe.  The area was then irrigated with saline.  The toe was wrapped with Xeroform gauze, then wrapped with 4 x 4's, and a Kerlix wrap.  Patient tolerated the procedure without issue.   Picture of post-I&D:   Dorothyann Brittle, DO [facility] [address] [place], [place] [address] [phone] (office)

## 2023-05-22 NOTE — Plan of Care (Signed)
  Problem: Education: Goal: Ability to describe self-care measures that may prevent or decrease complications (Diabetes Survival Skills Education) will improve Outcome: Progressing Goal: Individualized Educational Video(s) Outcome: Progressing   Problem: Coping: Goal: Ability to adjust to condition or change in health will improve Outcome: Progressing   Problem: Health Behavior/Discharge Planning: Goal: Ability to manage health-related needs will improve Outcome: Progressing   Problem: Metabolic: Goal: Ability to maintain appropriate glucose levels will improve Outcome: Progressing   Problem: Nutritional: Goal: Maintenance of adequate nutrition will improve Outcome: Progressing

## 2023-05-22 NOTE — ED Notes (Signed)
 Pt desaturated to 77% on RA while sleeping. She was placed on 2 L [place] and SPO2 increased to 99%. Kellogg RN

## 2023-05-22 NOTE — TOC Initial Note (Signed)
 Transition of Care [facility]) - Initial/Assessment Note    Patient Details  Name: [name] MRN: [id number] Date of Birth: [birth year]  [facility] CM/SW Contact:    Sharlyne Stabs, RN Phone Number: 05/22/2023, 2:08 PM  Clinical Narrative:        Patient admitted with cellulitis of left toe. Patient has high score for readmission. Patient has recent admission. Patient is from [facility].  She plans to return, continuing work up at this time. TOC following.            Expected Discharge Plan: Long Term Acute Care (LTAC) Barriers to Discharge: Continued Medical Work up   Patient Goals and CMS Choice Patient states their goals for this hospitalization and ongoing recovery are:: Return to LTC CMS Medicare.gov Compare Post Acute Care list provided to:: Patient Represenative (must comment) Choice offered to / list presented to : Adult Children     Expected Discharge Plan and Services      Living arrangements for the past 2 months: Skilled Nursing Facility       Prior Living Arrangements/Services Living arrangements for the past 2 months: Skilled Nursing Facility Lives with:: Facility Resident          Need for Family Participation in Patient Care: Yes (Comment) Care giver support system in place?: Yes (comment)   Criminal Activity/Legal Involvement Pertinent to Current Situation/Hospitalization: No - Comment as needed  Activities of Daily Living   ADL Screening (condition at time of admission) Independently performs ADLs?: No Does the patient have a NEW difficulty with bathing/dressing/toileting/self-feeding that is expected to last >3 days?: Yes (Initiates electronic notice to provider for possible OT consult) Does the patient have a NEW difficulty with getting in/out of bed, walking, or climbing stairs that is expected to last >3 days?: Yes (Initiates electronic notice to provider for possible PT consult) Does the patient have a NEW difficulty with  communication that is expected to last >3 days?: Yes (Initiates electronic notice to provider for possible SLP consult) Is the patient deaf or have difficulty hearing?: No Does the patient have difficulty seeing, even when wearing glasses/contacts?: No Does the patient have difficulty concentrating, remembering, or making decisions?: No  Permission Sought/Granted                  Emotional Assessment         Alcohol / Substance Use: Not Applicable Psych Involvement: No (comment)  Admission diagnosis:  Cellulitis of toe of left foot [L03.032] Cellulitis of left toe [L03.032] Patient Active Problem List   Diagnosis Date Noted   Abscess of left great toe 05/22/2023   Cellulitis of left toe 05/21/2023   Diabetes (HCC) 05/21/2023   Sepsis due to Escherichia coli (E. coli) (HCC) 05/04/2023   E coli bacteremia 05/04/2023   Cystitis with hematuria 05/04/2023   Sepsis due to urinary tract infection (HCC) 05/01/2023   UTI due to extended-spectrum beta lactamase (ESBL) producing Escherichia coli 02/18/2023   HTN (hypertension) 02/18/2023   Septic shock (HCC) 02/14/2023   Sepsis (HCC) 02/14/2023   Controlled type 2 diabetes mellitus with hyperglycemia (HCC) 02/14/2023   DVT (deep venous thrombosis) 09/08/22 09/08/2022   Acute left ankle pain 12/13/2021   UTI (urinary tract infection) 08/03/2021   Hypernatremia 07/30/2021   Acute urinary retention 07/30/2021   Hypokalemia 07/30/2021   Hypotension 07/29/2021   Ankle wound, left 07/29/2021   Severe sepsis (HCC) 07/28/2021   Dyslipidemia 07/28/2021   Acute metabolic encephalopathy 07/28/2021  MDD (major depressive disorder), recurrent severe, without psychosis (HCC) 11/14/2017   Severe bipolar I disorder, most recent episode depressed (HCC)    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 [place] [address] Phone: [phone] Fax: [phone]     Social Drivers of Health (SDOH) Social History: SDOH Screenings   Food Insecurity: No Food Insecurity (05/21/2023)  Housing: Low Risk  (05/21/2023)  Transportation Needs: No Transportation Needs (05/21/2023)  Utilities: Not At Risk (05/21/2023)  Alcohol Screen: Low Risk  (11/14/2017)  Social Connections: Unknown (05/21/2023)  Tobacco Use: Medium Risk (05/21/2023)   SDOH Interventions:     Readmission Risk Interventions    05/22/2023    2:07 PM 05/02/2023    8:07 AM 02/14/2023    9:57 AM  Readmission Risk Prevention Plan  Transportation Screening Complete Complete Complete  Medication Review Oceanographer) Complete Complete Complete  PCP or Specialist appointment within 3-5 days of discharge Complete    HRI or Home Care Consult Complete Complete Complete  SW Recovery Care/Counseling Consult Complete Complete Complete  Palliative Care Screening Not Applicable Not Applicable Not Applicable  Skilled Nursing Facility Complete Complete Complete

## 2023-05-22 NOTE — ED Notes (Signed)
 Pt yelling out "change me,I'm soaked" multiple times. Pt has been educated on use of call button however, she continues to yell. [name]

## 2023-05-22 NOTE — ED Notes (Signed)
Surgery team assessing pt.

## 2023-05-22 NOTE — Progress Notes (Signed)
 PROGRESS NOTE    [name]  FMW:[id number] DOB: 08-Feb-1956 DOA: 05/21/2023 PCP: Patient, [name] Per   Brief Narrative:    [name] is a 68 y.o. female with medical history significant for diabetes mellitus, DVT, on chronic anticoagulation, hypertension, stage II lung cancer. Patient presented to the ED with complaints of redness pain and swelling to the big toe of her left foot, symptoms started a week ago, but reports started looking worse over the past 24 hours.  Patient was admitted for treatment of cellulitis of the left great toe and has undergone I/D on 1/7.  Assessment & Plan:   Principal Problem:   Cellulitis of left toe Active Problems:   MDD (major depressive disorder), recurrent severe, without psychosis (HCC)   DVT (deep venous thrombosis) 09/08/22   HTN (hypertension)   Diabetes (HCC)   Abscess of left great toe  Assessment and Plan:  Cellulitis of left toe -No signs of osteomyelitis -Status post I&D by general surgery to left great toe, follow cultures -Continue current IV antibiotics   Diabetes (HCC) with mild hyperglycemia Controlled.  A1c 10.9. - SSI- M -Hold home Lantus  10 units daily -Hold home metformin    HTN (hypertension) Stable.  Not on antihypertensives.   Also on methimazole , ?? Hyperthyrodism- not documented in chart.   DVT (deep venous thrombosis) 09/08/22 May resume Eliquis  by a.m. if adequate hemostasis achieved   MDD (major depressive disorder), recurrent severe, without psychosis (HCC) Resume quetiapine , lithium   Obesity, class I -BMI 33.92  DVT prophylaxis: SCDs Code Status: Full Family Communication: None at bedside Disposition Plan:  Status is: Inpatient Remains inpatient appropriate because: Need for IV antibiotics.   Consultants:  Gen Surg  Procedures:  I/D to left great toe 1/7  Antimicrobials:  Anti-infectives (From admission, onward)    Start     Dose/Rate Route Frequency Ordered Stop   05/22/23 1200   vancomycin  (VANCOCIN ) IVPB 1000 mg/200 mL premix        1,000 mg 200 mL/hr over 60 Minutes Intravenous Every 12 hours 05/21/23 2334     05/22/23 0030  vancomycin  (VANCOCIN ) IVPB 1000 mg/200 mL premix       Placed in Followed by Linked Group   1,000 mg 200 mL/hr over 60 Minutes Intravenous  Once 05/21/23 2312 05/22/23 0322   05/21/23 2315  vancomycin  (VANCOCIN ) IVPB 1000 mg/200 mL premix       Placed in Followed by Linked Group   1,000 mg 200 mL/hr over 60 Minutes Intravenous  Once 05/21/23 2312     05/21/23 2300  cefTRIAXone  (ROCEPHIN ) 2 g in sodium chloride  0.9 % 100 mL IVPB        2 g 200 mL/hr over 30 Minutes Intravenous Every 24 hours 05/21/23 2236 05/28/23 2259   05/21/23 1800  ceFAZolin  (ANCEF ) IVPB 2g/100 mL premix        2 g 200 mL/hr over 30 Minutes Intravenous  Once 05/21/23 1753 05/21/23 2225      Subjective: Patient seen and evaluated today with no new acute complaints or concerns. No acute concerns or events noted overnight.  Objective: Vitals:   05/22/23 0700 05/22/23 0800 05/22/23 1000 05/22/23 1048  BP: (!) 101/55 122/69 122/62   Pulse: 69 77 83   Resp:   18 17  Temp:    98.3 F (36.8 C)  TempSrc:    Oral  SpO2: 97% 97% 96%   Weight:        Intake/Output Summary (Last 24 hours)  at 05/22/2023 1223 Last data filed at 05/22/2023 0331 Gross per 24 hour  Intake 580 ml  Output --  Net 580 ml   Filed Weights   05/21/23 2300  Weight: 104.2 kg    Examination:  General exam: Appears calm and comfortable  Respiratory system: Clear to auscultation. Respiratory effort normal.  Nasal cannula oxygen Cardiovascular system: S1 & S2 heard, RRR.  Gastrointestinal system: Abdomen is soft Central nervous system: Alert and awake Extremities: No edema Skin:   Psychiatry: Flat affect.    Data Reviewed: I have personally reviewed following labs and imaging studies  CBC: Recent Labs  Lab 05/21/23 1650 05/22/23 0500  WBC 6.2 5.7  NEUTROABS 4.4  --   HGB  12.4 12.2  HCT 39.1 39.7  MCV 94.9 95.7  PLT 200 207   Basic Metabolic Panel: Recent Labs  Lab 05/21/23 1650 05/22/23 0500  NA 136 135  K 3.9 3.9  CL 104 101  CO2 27 29  GLUCOSE 128* 156*  BUN 10 10  CREATININE 0.40* 0.41*  CALCIUM  9.2 9.1   GFR: Estimated Creatinine Clearance: 90.1 mL/min (A) (by C-G formula based on SCr of 0.41 mg/dL (L)). Liver Function Tests: Recent Labs  Lab 05/21/23 1650  AST 13*  ALT 25  ALKPHOS 91  BILITOT 0.4  PROT 6.6  ALBUMIN 3.1*   No results for input(s): LIPASE, AMYLASE in the last 168 hours. No results for input(s): AMMONIA in the last 168 hours. Coagulation Profile: No results for input(s): INR, PROTIME in the last 168 hours. Cardiac Enzymes: No results for input(s): CKTOTAL, CKMB, CKMBINDEX, TROPONINI in the last 168 hours. BNP (last 3 results) No results for input(s): PROBNP in the last 8760 hours. HbA1C: No results for input(s): HGBA1C in the last 72 hours. CBG: Recent Labs  Lab 05/21/23 2327 05/22/23 0839  GLUCAP 185* 158*   Lipid Profile: No results for input(s): CHOL, HDL, LDLCALC, TRIG, CHOLHDL, LDLDIRECT in the last 72 hours. Thyroid  Function Tests: No results for input(s): TSH, T4TOTAL, FREET4, T3FREE, THYROIDAB in the last 72 hours. Anemia Panel: No results for input(s): VITAMINB12, FOLATE, FERRITIN, TIBC, IRON, RETICCTPCT in the last 72 hours. Sepsis Labs: No results for input(s): PROCALCITON, LATICACIDVEN in the last 168 hours.  No results found for this or any previous visit (from the past 240 hours).       Radiology Studies: US  ARTERIAL ABI (SCREENING LOWER EXTREMITY) Result Date: 05/22/2023 CLINICAL DATA:  Left first toe cellulitis and infection with drainage. History of diabetes, hyperlipidemia and hypertension. EXAM: NONINVASIVE PHYSIOLOGIC VASCULAR STUDY OF BILATERAL LOWER EXTREMITIES TECHNIQUE: Evaluation of both lower extremities were  performed at rest, including calculation of ankle-brachial indices with single level Doppler, pressure and pulse volume recording. COMPARISON:  None Available. FINDINGS: Right ABI:  1.05 Left ABI:  0.99 Right Lower Extremity: Monophasic posterior tibial and dorsalis pedis waveforms. Left Lower Extremity: Biphasic posterior tibial and dorsalis pedis waveforms. 1.0-1.4 Normal IMPRESSION: No significant reduction in resting ankle-brachial indices bilaterally. Some abnormalities of distal waveforms may reflect some degree of underlying tibial disease bilaterally. Electronically Signed   By: Marcey Moan M.D.   On: 05/22/2023 08:41   CT FOOT LEFT W CONTRAST Result Date: 05/21/2023 CLINICAL DATA:  Diabetic foot infection with swelling and erythema and pain. First big toe cellulitis. EXAM: CT OF THE LOWER LEFT EXTREMITY WITH CONTRAST TECHNIQUE: Multidetector CT imaging of the lower left extremity was performed according to the standard protocol following intravenous contrast administration. RADIATION DOSE REDUCTION: This exam  was performed according to the departmental dose-optimization program which includes automated exposure control, adjustment of the mA and/or kV according to patient size and/or use of iterative reconstruction technique. CONTRAST:  75mL OMNIPAQUE  IOHEXOL  300 MG/ML  SOLN COMPARISON:  Radiographs 05/21/2023 FINDINGS: Bones/Joint/Cartilage Severe demineralization limits sensitivity for subtle fractures and osteomyelitis. No evidence of acute fracture or osteomyelitis. Ligaments Suboptimally assessed by CT. Muscles and Tendons Marked atrophy.  No acute abnormality. Soft tissues Soft tissue swelling about the dorsum of the foot on the big toe. No drainable fluid collection. No soft tissue gas. IMPRESSION: 1. Soft tissue swelling about the dorsum of the foot and big toe is nonspecific but can be seen with cellulitis. No abscess. 2. Severe demineralization limits sensitivity for subtle fractures and  osteomyelitis. No evidence of acute fracture or osteomyelitis. If there is ongoing concern for osteomyelitis MRI is recommended Electronically Signed   By: Norman Gatlin M.D.   On: 05/21/2023 21:08   DG Foot Complete Left Result Date: 05/21/2023 CLINICAL DATA:  pain, redness, left great toe Possible foot injury or infection. EXAM: LEFT FOOT - COMPLETE 3+ VIEW COMPARISON:  Radiographs 02/18/2023 FINDINGS: The bones are severely demineralized. There is no evidence of acute fracture, dislocation or bone destruction. Stable postsurgical changes in the distal fibula. Stable small posterior calcaneal spur. No focal soft tissue abnormalities are identified. IMPRESSION: Stable radiographic appearance of the left foot. Severe osseous demineralization without evidence of acute fracture or osteomyelitis. Electronically Signed   By: Elsie Perone M.D.   On: 05/21/2023 16:50        Scheduled Meds:  gabapentin   300 mg Oral BID   insulin  aspart  0-15 Units Subcutaneous TID WC   insulin  aspart  0-5 Units Subcutaneous QHS   lactulose   20 g Oral BID   lidocaine  (PF)  5 mL Intradermal Once   lithium  carbonate  300 mg Oral QHS   lithium   600 mg Oral Daily   lubiprostone   24 mcg Oral BID WC   melatonin  9 mg Oral QHS   methimazole   5 mg Oral BID   QUEtiapine   200 mg Oral QHS   QUEtiapine   50 mg Oral Daily   tamsulosin   0.4 mg Oral Daily   Continuous Infusions:  cefTRIAXone  (ROCEPHIN )  IV Stopped (05/22/23 0322)   vancomycin      vancomycin        LOS: 1 day    Time spent: 55 minutes    Claudia Thien D Maree, DO [facility]  If 7PM-7AM, please contact night-coverage [email or web address] 05/22/2023, 12:23 PM

## 2023-05-22 NOTE — Consult Note (Addendum)
 I was present with the medical student for this service. I personally verified the history of present illness, performed the physical exam, and made the plan for this encounter. I have verified the medical student's documentation and made modifications where appropriately. I have personally documented in my own words a brief history, physical, and plan below.     Patient seen and examined.  She is resting comfortably in bed.  She states that about a week ago, she noted increased swelling associated with her left great toe.  It started hurting over the last couple of days.  At her SNF facility, they started her on doxycycline  2 days ago, but the area continued to progress, which resulted her presenting to the emergency department.  She does have decreased sensation in her legs given her diabetes.  Her past medical history is significant for GERD and diabetes.  She denies use of blood thinning medications.  In the ED, she underwent both a left foot x-ray and CT of the left foot.  X-ray demonstrates no concerning findings for osteomyelitis, and CT demonstrated soft tissue swelling at the dorsum of the foot and great toe seen in cellulitis without evidence of abscess and no evidence of osteomyelitis.  She has no leukocytosis.  Physical exam: Vascular exam: 2+ DP and PT pulses in left foot, 1+ DP and PT pulses in the right foot Skin: Left lower extremity with mild swelling and erythema at the dorsum of the foot and left great toe, fluctuant area along the left great toe nailbed extending around the medial aspect of the nail, surrounding induration and erythema, minimal tenderness to palpation Post-I&D photo:   Assessment and plan: Patient is a 68 year old female who was admitted to the hospital with cellulitis of her left great toe.  General surgery was consulted for possible I&D and culture.  -We discussed that she likely has cellulitis of her great toe with an associated abscess at the base of her  nailbed, which will benefit from incision and drainage -Also discussed that imaging does not demonstrate any osteomyelitis at this time, but if the infection progresses, she may develop osteomyelitis and require amputation in the future.  She is adamantly refusing any amputation at this time. I discussed with her that I feel she does not need an amputation currently -The risks and benefits of left great toe incision and drainage were discussed with the patient including but not limited to bleeding and need for further procedures.  After careful consideration, [name] has decided to proceed with procedure -Please refer to separate note for details of procedure -Follow-up wound culture -Antibiotics per primary team -Continue with local wound care of left great toe, will plan to change dressing tomorrow -No need for acute surgical intervention at this time -Appreciate hospitalist recommendations  Claudia Brittle, DO [facility] [address] [place], [place] [address] [phone] (office)   [facility] Consult Note  Reason for Consult: Left great toe wound Referring Physician: Pearlean Tully [place], MD   [name] is an 68 y.o. female w/ PMHx of DM, HTN, Lung Cancer (s/p RULobectomy remission since 2012) from [facility] HPI: 1-2 weeks of redness, swelling to the dorsum of left great toe with development of pain to the great toe for the last few days. Her pain extends slightly into the base of the distal left foot. The patient has limited mobility at baseline, and uses a wheelchair for ambulation, but has not done this in the past  couple of days due to pain. She was receiving soap and water washes regularly at her SNF, then was placed on PO doxycycline  for two days prior to being sent to the ED. She states that she has not felt febrile, chills, appetite change, nausea, or vomiting.   Past Medical History:  Diagnosis  Date   Allergy    Bipolar disorder (HCC)    Depression    Drug abuse (HCC)    history of, went to rehab 2015   GERD (gastroesophageal reflux disease)    Lung cancer (HCC)    lung ca dx 11/11- right upper lobe    Past Surgical History:  Procedure Laterality Date   ABDOMINAL AORTOGRAM W/LOWER EXTREMITY Bilateral 01/10/2022   Procedure: ABDOMINAL AORTOGRAM W/LOWER EXTREMITY;  Surgeon: Serene Gaile ORN, MD;  Location: [facility];  Service: Cardiovascular;  Laterality: Bilateral;   ANKLE FRACTURE SURGERY Left    HARDWARE REMOVAL Left 12/13/2021   Procedure: LEFT ANKLE HARDWARE REMOVAL;  Surgeon: Sheril Coy, MD;  Location: [facility];  Service: Orthopedics;  Laterality: Left;   LUNG LOBECTOMY Right 2011   RUL removed for lung cancer   STYLOID PROCESS EXCISION Left 10/16/2014   Procedure: EXCISION LEFT STYLOID PROCESS;  Surgeon: Lonni FORBES Angle, MD;  Location: [facility];  Service: ENT;  Laterality: Left;   TONSILLECTOMY Bilateral 10/16/2014   Procedure: BILATERAL TONSILLECTOMY;  Surgeon: Lonni FORBES Angle, MD;  Location: [facility];  Service: ENT;  Laterality: Bilateral;   TUBAL LIGATION  1984    Family History  Problem Relation Age of Onset   Cancer Mother        breast cancer   Cancer Father        stomach cancer   Diabetes Father    Cancer Sister        breast cancer   Diabetes Brother    Diabetes Brother    Cancer Sister        breast cancer    Social History:  reports that she has quit smoking. Her smoking use included cigarettes. She has a 40 pack-year smoking history. She has never used smokeless tobacco. She reports that she does not currently use alcohol. She reports that she does not currently use drugs after having used the following drugs: Benzodiazepines.  Allergies:  Allergies  Allergen Reactions   Sulfa Antibiotics Swelling and Rash    Neck swelling    Medications: I have reviewed the patient's current  medications.  Results for orders placed or performed during the hospital encounter of 05/21/23 (from the past 48 hours)  CBC with Differential     Status: None   Collection Time: 05/21/23  4:50 PM  Result Value Ref Range   WBC 6.2 4.0 - 10.5 K/uL   RBC 4.12 3.87 - 5.11 MIL/uL   Hemoglobin 12.4 12.0 - 15.0 g/dL   HCT 60.8 63.9 - 53.9 %   MCV 94.9 80.0 - 100.0 fL   MCH 30.1 26.0 - 34.0 pg   MCHC 31.7 30.0 - 36.0 g/dL   RDW 86.3 88.4 - 84.4 %   Platelets 200 150 - 400 K/uL   nRBC 0.0 0.0 - 0.2 %   Neutrophils Relative % 72 %   Neutro Abs 4.4 1.7 - 7.7 K/uL   Lymphocytes Relative 17 %   Lymphs Abs 1.1 0.7 - 4.0 K/uL   Monocytes Relative 9 %   Monocytes Absolute 0.6 0.1 - 1.0 K/uL   Eosinophils Relative 2 %  Eosinophils Absolute 0.1 0.0 - 0.5 K/uL   Basophils Relative 0 %   Basophils Absolute 0.0 0.0 - 0.1 K/uL   Immature Granulocytes 0 %   Abs Immature Granulocytes 0.02 0.00 - 0.07 K/uL    Comment: Performed at [facility], [address] [place], [place] [address]  Comprehensive metabolic panel     Status: Abnormal   Collection Time: 05/21/23  4:50 PM  Result Value Ref Range   Sodium 136 135 - 145 mmol/L   Potassium 3.9 3.5 - 5.1 mmol/L   Chloride 104 98 - 111 mmol/L   CO2 27 22 - 32 mmol/L   Glucose, Bld 128 (H) 70 - 99 mg/dL    Comment: Glucose reference range applies only to samples taken after fasting for at least 8 hours.   BUN 10 8 - 23 mg/dL   Creatinine, Ser 9.59 (L) 0.44 - 1.00 mg/dL   Calcium  9.2 8.9 - 10.3 mg/dL   Total Protein 6.6 6.5 - 8.1 g/dL   Albumin 3.1 (L) 3.5 - 5.0 g/dL   AST 13 (L) 15 - 41 U/L   ALT 25 0 - 44 U/L   Alkaline Phosphatase 91 38 - 126 U/L   Total Bilirubin 0.4 0.0 - 1.2 mg/dL   GFR, Estimated >39 >39 mL/min    Comment: (NOTE) Calculated using the CKD-EPI Creatinine Equation (2021)    Anion gap 5 5 - 15    Comment: Performed at [facility], [address] [place], [place] [address]  CBG monitoring, ED     Status: Abnormal    Collection Time: 05/21/23 11:27 PM  Result Value Ref Range   Glucose-Capillary 185 (H) 70 - 99 mg/dL    Comment: Glucose reference range applies only to samples taken after fasting for at least 8 hours.  Basic metabolic panel     Status: Abnormal   Collection Time: 05/22/23  5:00 AM  Result Value Ref Range   Sodium 135 135 - 145 mmol/L   Potassium 3.9 3.5 - 5.1 mmol/L   Chloride 101 98 - 111 mmol/L   CO2 29 22 - 32 mmol/L   Glucose, Bld 156 (H) 70 - 99 mg/dL    Comment: Glucose reference range applies only to samples taken after fasting for at least 8 hours.   BUN 10 8 - 23 mg/dL   Creatinine, Ser 9.58 (L) 0.44 - 1.00 mg/dL   Calcium  9.1 8.9 - 10.3 mg/dL   GFR, Estimated >39 >39 mL/min    Comment: (NOTE) Calculated using the CKD-EPI Creatinine Equation (2021)    Anion gap 5 5 - 15    Comment: Performed at [facility], [address] [place], [place] [address]  CBC     Status: None   Collection Time: 05/22/23  5:00 AM  Result Value Ref Range   WBC 5.7 4.0 - 10.5 K/uL   RBC 4.15 3.87 - 5.11 MIL/uL   Hemoglobin 12.2 12.0 - 15.0 g/dL   HCT 60.2 63.9 - 53.9 %   MCV 95.7 80.0 - 100.0 fL   MCH 29.4 26.0 - 34.0 pg   MCHC 30.7 30.0 - 36.0 g/dL   RDW 86.4 88.4 - 84.4 %   Platelets 207 150 - 400 K/uL   nRBC 0.0 0.0 - 0.2 %    Comment: Performed at [facility], [address] [place], [place] [address]    CT FOOT LEFT W CONTRAST Result Date: 05/21/2023 CLINICAL DATA:  Diabetic foot infection with swelling and erythema  and pain. First big toe cellulitis. EXAM: CT OF THE LOWER LEFT EXTREMITY WITH CONTRAST TECHNIQUE: Multidetector CT imaging of the lower left extremity was performed according to the standard protocol following intravenous contrast administration. RADIATION DOSE REDUCTION: This exam was performed according to the departmental dose-optimization program which includes automated exposure control, adjustment of the mA and/or kV according to patient size and/or use of iterative  reconstruction technique. CONTRAST:  75mL OMNIPAQUE  IOHEXOL  300 MG/ML  SOLN COMPARISON:  Radiographs 05/21/2023 FINDINGS: Bones/Joint/Cartilage Severe demineralization limits sensitivity for subtle fractures and osteomyelitis. No evidence of acute fracture or osteomyelitis. Ligaments Suboptimally assessed by CT. Muscles and Tendons Marked atrophy.  No acute abnormality. Soft tissues Soft tissue swelling about the dorsum of the foot on the big toe. No drainable fluid collection. No soft tissue gas. IMPRESSION: 1. Soft tissue swelling about the dorsum of the foot and big toe is nonspecific but can be seen with cellulitis. No abscess. 2. Severe demineralization limits sensitivity for subtle fractures and osteomyelitis. No evidence of acute fracture or osteomyelitis. If there is ongoing concern for osteomyelitis MRI is recommended Electronically Signed   By: Norman Gatlin M.D.   On: 05/21/2023 21:08   DG Foot Complete Left Result Date: 05/21/2023 CLINICAL DATA:  pain, redness, left great toe Possible foot injury or infection. EXAM: LEFT FOOT - COMPLETE 3+ VIEW COMPARISON:  Radiographs 02/18/2023 FINDINGS: The bones are severely demineralized. There is no evidence of acute fracture, dislocation or bone destruction. Stable postsurgical changes in the distal fibula. Stable small posterior calcaneal spur. No focal soft tissue abnormalities are identified. IMPRESSION: Stable radiographic appearance of the left foot. Severe osseous demineralization without evidence of acute fracture or osteomyelitis. Electronically Signed   By: Elsie Perone M.D.   On: 05/21/2023 16:50    ROS:  Pertinent items noted in HPI and remainder of comprehensive ROS otherwise negative.  Blood pressure 124/64, pulse 71, temperature 97.8 F (36.6 C), temperature source Oral, resp. rate 18, weight 104.2 kg, SpO2 98%. Physical Exam Constitutional:      General: She is not in acute distress.    Appearance: Normal appearance. She is not  ill-appearing.  HENT:     Head: Normocephalic.     Nose: Nose normal.     Mouth/Throat:     Comments: Mostly edentulous Cardiovascular:     Rate and Rhythm: Normal rate and regular rhythm.     Pulses:          Dorsalis pedis pulses are 2+ on the right side and 2+ on the left side.     Heart sounds: Normal heart sounds.  Pulmonary:     Effort: Pulmonary effort is normal.  Abdominal:     Palpations: Abdomen is soft.     Tenderness: There is no abdominal tenderness.  Musculoskeletal:     Comments: Nontender left great toe. See skin.  Skin:    Findings: Erythema present.     Comments: Diffuse erythema to the dorsum of the left great toe. Fluctuance and darkened 1 cm rim to the proximo-medial skin surrounding the left great toenail-bed. Distally and slightly medially, there is hardening and darkening of the skin under the tip of the nail. Lateral nailbed appears normal. No open, draining wound or purulence. See photo.  Neurological:     Mental Status: She is alert.         Assessment/Plan: [name] is a 68 year old female who resides at [facility] w/ PMHx of T2DM on insulin , HTN,  DVT, Lung Ca (remission 2012) who has had left great toe redness, swelling x1-2 weeks, painful for a few days, afebrile, stable vital signs admitted for soft tissue infection of left great toe. She notably had a recent hospitalization for ESBL E. Coli UTI and bacteremia tx w/ meropenem .   #Skin/soft tissue infection of the left great toe - WBC 5.7, afebrile. This is her first toe infection. Appears to be superficial infection. Low concern for osteo or bacteremia. - CT L Foot: Soft tissue swelling about the dorsum of the foot and big toe. No evidence of acute fracture or osteomyelitis. Xray consistent with this. - ABI: Normal 1.0-1.4 bilaterally - Continue with vanc & ceftriaxone , (+ nasal MRSA swab on 05/01/2023) - I&D w/ wound culture performed today, see updated photos - Will see her in the  morning, 05/23/23  [name] 05/22/2023, 7:52 AM

## 2023-05-22 NOTE — ED Notes (Signed)
 Pt yelling out once more reporting that her tailbone hurts and her legs hurt. Pt was turned to the left, pillow placed behind her, and head decreased to 40 degrees. Kellogg RN

## 2023-05-22 NOTE — ED Notes (Signed)
 Pt yelling out despite being educated on call button. She has yelled out for multiple requests. This RN meets request needs and patient continues to yell help me and get me up and my legs, I can't take it anymore. These statements have been all shift this far. Will give another dose of prn pain med. Kellogg RN

## 2023-05-22 NOTE — ED Notes (Signed)
 CBG 145

## 2023-05-23 ENCOUNTER — Inpatient Hospital Stay (HOSPITAL_COMMUNITY): Payer: Medicare Other

## 2023-05-23 DIAGNOSIS — L03032 Cellulitis of left toe: Secondary | ICD-10-CM | POA: Diagnosis not present

## 2023-05-23 LAB — GLUCOSE, CAPILLARY
Glucose-Capillary: 125 mg/dL — ABNORMAL HIGH (ref 70–99)
Glucose-Capillary: 214 mg/dL — ABNORMAL HIGH (ref 70–99)
Glucose-Capillary: 202 mg/dL — ABNORMAL HIGH (ref 70–99)
Glucose-Capillary: 263 mg/dL — ABNORMAL HIGH (ref 70–99)

## 2023-05-23 LAB — MAGNESIUM: Magnesium: 2.1 mg/dL (ref 1.7–2.4)

## 2023-05-23 LAB — CBC
HCT: 38.7 % (ref 36.0–46.0)
Hemoglobin: 12.1 g/dL (ref 12.0–15.0)
MCH: 29.8 pg (ref 26.0–34.0)
MCHC: 31.3 g/dL (ref 30.0–36.0)
MCV: 95.3 fL (ref 80.0–100.0)
Platelets: 209 10*3/uL (ref 150–400)
RBC: 4.06 MIL/uL (ref 3.87–5.11)
RDW: 13.3 % (ref 11.5–15.5)
WBC: 5.4 10*3/uL (ref 4.0–10.5)
nRBC: 0 % (ref 0.0–0.2)

## 2023-05-23 LAB — SEDIMENTATION RATE: Sed Rate: 35 mm/h — ABNORMAL HIGH (ref 0–22)

## 2023-05-23 LAB — BASIC METABOLIC PANEL
Anion gap: 8 (ref 5–15)
BUN: 12 mg/dL (ref 8–23)
CO2: 27 mmol/L (ref 22–32)
Calcium: 8.9 mg/dL (ref 8.9–10.3)
Chloride: 103 mmol/L (ref 98–111)
Creatinine, Ser: 0.39 mg/dL — ABNORMAL LOW (ref 0.44–1.00)
GFR, Estimated: >60 mL/min (ref >60)
Glucose, Bld: 158 mg/dL — ABNORMAL HIGH (ref 70–99)
Potassium: 3.7 mmol/L (ref 3.5–5.1)
Sodium: 138 mmol/L (ref 135–145)

## 2023-05-23 LAB — C-REACTIVE PROTEIN: CRP: 2.7 mg/dL — ABNORMAL HIGH (ref <1.0)

## 2023-05-23 MED ORDER — BASAGLAR KWIKPEN 100 UNIT/ML ~~LOC~~ SOPN: 10.0000 [IU] | PEN_INJECTOR | Freq: Every day | SUBCUTANEOUS | Status: DC

## 2023-05-23 MED ORDER — OXYCODONE-ACETAMINOPHEN 5-325 MG PO TABS
1.0000 | ORAL_TABLET | ORAL | Status: DC | PRN
  Administered 2023-05-23 – 2023-05-24 (×6): 1 via ORAL
  Filled 2023-05-23 (×6): qty 1

## 2023-05-23 MED ORDER — OXYCODONE HCL 5 MG PO TABS
5.0000 mg | ORAL_TABLET | ORAL | Status: DC | PRN
  Administered 2023-05-23 – 2023-05-24 (×3): 5 mg via ORAL
  Filled 2023-05-23 (×3): qty 1

## 2023-05-23 MED ORDER — INSULIN GLARGINE-YFGN 100 UNIT/ML ~~LOC~~ SOLN
10.0000 [IU] | Freq: Every day | SUBCUTANEOUS | Status: DC
  Administered 2023-05-23 – 2023-05-24 (×2): 10 [IU] via SUBCUTANEOUS
  Filled 2023-05-23 (×3): qty 0.1

## 2023-05-23 MED ORDER — APIXABAN 5 MG PO TABS
5.0000 mg | ORAL_TABLET | Freq: Two times a day (BID) | ORAL | Status: DC
Start: 2023-05-23 — End: 2023-05-25
  Administered 2023-05-23 – 2023-05-24 (×3): 5 mg via ORAL
  Filled 2023-05-23 (×3): qty 1

## 2023-05-23 MED ORDER — MORPHINE SULFATE (PF) 4 MG/ML IV SOLN
4.0000 mg | INTRAVENOUS | Status: DC | PRN
  Administered 2023-05-23 – 2023-05-24 (×2): 4 mg via INTRAVENOUS
  Filled 2023-05-23 (×2): qty 1

## 2023-05-23 NOTE — Plan of Care (Signed)
  Problem: Coping: Goal: Ability to adjust to condition or change in health will improve Outcome: Not Progressing   Problem: Metabolic: Goal: Ability to maintain appropriate glucose levels will improve Outcome: Not Progressing   Problem: Nutritional: Goal: Maintenance of adequate nutrition will improve Outcome: Not Progressing Goal: Progress toward achieving an optimal weight will improve Outcome: Not Progressing   Problem: Skin Integrity: Goal: Risk for impaired skin integrity will decrease Outcome: Not Progressing   Problem: Clinical Measurements: Goal: Will remain free from infection Outcome: Not Progressing   Problem: Pain Management: Goal: General experience of comfort will improve Outcome: Not Progressing

## 2023-05-23 NOTE — Progress Notes (Signed)
 PROGRESS NOTE    [name]  FMW:[id number] DOB: 14-Jan-1956 DOA: 05/21/2023 PCP: Patient, [name] Per  Chief Complaint  Patient presents with   Toe Injury    Pt reports she has been having issues with her toes for about [name] week, and it has been looking the way it looks now for about one day. No injury to foot (pt does not walk).     Brief Narrative:   [name] is [name] 68 y.o. female with medical history significant for diabetes mellitus, DVT, on chronic anticoagulation, hypertension, stage II lung cancer. Patient presented to the ED with complaints of redness pain and swelling to the big toe of her left foot, symptoms started [name] week ago, but reports started looking worse over the past 24 hours.  Patient was admitted for treatment of cellulitis of the left great toe and has undergone I/D on 1/7.  Assessment & Plan:   Principal Problem:   Cellulitis of left toe Active Problems:   MDD (major depressive disorder), recurrent severe, without psychosis (HCC)   DVT (deep venous thrombosis) 09/08/22   HTN (hypertension)   Diabetes (HCC)   Abscess of left great toe  Cellulitis of left toe -No signs of osteomyelitis -ABI without significant reduction in resting ABI's bilaterally (abnormalities of distal waveforms may reflect some degree of underlying tibial disease bilaterally) -with underlying diabetes, should have low index of suspicion for osteo -> sed rate not particularly impressive, awaiting sed rate -staph aureus on culture, awaiting susceptibilities  -Continue vancomycin    Diabetes (HCC) with mild hyperglycemia Controlled.  A1c 10.9. - SSI- M -Hold home Lantus  10 units daily -Hold home metformin    HTN (hypertension) Stable.  Not on antihypertensives.    Hyperthyroidism  This seems to have been started 02/14/2023 Not clear diagnosis -> TSH was not completely suppressed at the time of diagnosis Repeat TSH, I think endocrine follow up will be important for clarification of  diagnosis DVT (deep venous thrombosis) 09/08/22 Will resume eliquis    MDD (major depressive disorder), recurrent severe, without psychosis (HCC) Resume quetiapine , lithium    Obesity, class I Body mass index is 34.93 kg/m.    DVT prophylaxis: SCD Code Status: full Family Communication: none Disposition:   Status is: Inpatient Remains inpatient appropriate because: need for continued inpatient care   Consultants:  surgery  Procedures:  05/23/2023  Incision and drainage of left great toe abscess   Antimicrobials:  Anti-infectives (From admission, onward)    Start     Dose/Rate Route Frequency Ordered Stop   05/22/23 1200  vancomycin  (VANCOCIN ) IVPB 1000 mg/200 mL premix        1,000 mg 200 mL/hr over 60 Minutes Intravenous Every 12 hours 05/21/23 2334     05/22/23 0030  vancomycin  (VANCOCIN ) IVPB 1000 mg/200 mL premix       Placed in Followed by Linked Group   1,000 mg 200 mL/hr over 60 Minutes Intravenous  Once 05/21/23 2312 05/22/23 0322   05/21/23 2315  vancomycin  (VANCOCIN ) IVPB 1000 mg/200 mL premix       Placed in Followed by Linked Group   1,000 mg 200 mL/hr over 60 Minutes Intravenous  Once 05/21/23 2312     05/21/23 2300  cefTRIAXone  (ROCEPHIN ) 2 g in sodium chloride  0.9 % 100 mL IVPB  Status:  Discontinued        2 g 200 mL/hr over 30 Minutes Intravenous Every 24 hours 05/21/23 2236 05/23/23 1208   05/21/23 1800  ceFAZolin  (ANCEF ) IVPB  2g/100 mL premix        2 g 200 mL/hr over 30 Minutes Intravenous  Once 05/21/23 1753 05/21/23 2225       Subjective: Upset regarding diet and pain meds C/o pain to foot  Objective: Vitals:   05/22/23 1826 05/22/23 2149 05/23/23 0514 05/23/23 1453  BP: 105/65 104/67 (!) 105/52 101/74  Pulse: 86 90 63 87  Resp: 20 20 18    Temp:  99.5 F (37.5 C) 98 F (36.7 C) 98.6 F (37 C)  TempSrc:  Oral  Oral  SpO2: 95% 92% 97% 95%  Weight:      Height:        Intake/Output Summary (Last 24 hours) at 05/23/2023  1621 Last data filed at 05/23/2023 1435 Gross per 24 hour  Intake 1139.57 ml  Output 3100 ml  Net -1960.43 ml   Filed Weights   05/21/23 2300 05/22/23 1530  Weight: 104.2 kg 107.3 kg    Examination:  General exam: Appears calm and comfortable  Respiratory system: unlabored Cardiovascular system: RRR Central nervous system: Alert and oriented. No focal neurological deficits. Extremities: dressing to LLE    Data Reviewed: I have personally reviewed following labs and imaging studies  CBC: Recent Labs  Lab 05/21/23 1650 05/22/23 0500 05/23/23 0417  WBC 6.2 5.7 5.4  NEUTROABS 4.4  --   --   HGB 12.4 12.2 12.1  HCT 39.1 39.7 38.7  MCV 94.9 95.7 95.3  PLT 200 207 209    Basic Metabolic Panel: Recent Labs  Lab 05/21/23 1650 05/22/23 0500 05/23/23 0417  NA 136 135 138  K 3.9 3.9 3.7  CL 104 101 103  CO2 27 29 27   GLUCOSE 128* 156* 158*  BUN 10 10 12   CREATININE 0.40* 0.41* 0.39*  CALCIUM  9.2 9.1 8.9  MG  --   --  2.1    GFR: Estimated Creatinine Clearance: 91.4 mL/min ([name]) (by C-G formula based on SCr of 0.39 mg/dL (L)).  Liver Function Tests: Recent Labs  Lab 05/21/23 1650  AST 13*  ALT 25  ALKPHOS 91  BILITOT 0.4  PROT 6.6  ALBUMIN 3.1*    CBG: Recent Labs  Lab 05/22/23 1304 05/22/23 1611 05/22/23 2152 05/23/23 0733 05/23/23 1056  GLUCAP 148* 188* 186* 125* 214*     Recent Results (from the past 240 hours)  Aerobic/Anaerobic Culture w Gram Stain (surgical/deep wound)     Status: None (Preliminary result)   Collection Time: 05/22/23 10:09 AM   Specimen: Abscess  Result Value Ref Range Status   Specimen Description ABSCESS TOE LEFT  Final   Gram Stain   Final    RARE WBC PRESENT, PREDOMINANTLY PMN RARE GRAM POSITIVE COCCI    Culture   Final    ABUNDANT STAPHYLOCOCCUS AUREUS SUSCEPTIBILITIES TO FOLLOW Performed at [facility], [address], [place], [place] [address]    Report Status PENDING  Incomplete          Radiology Studies: US  ARTERIAL ABI (SCREENING LOWER EXTREMITY) Result Date: 05/22/2023 CLINICAL DATA:  Left first toe cellulitis and infection with drainage. History of diabetes, hyperlipidemia and hypertension. EXAM: NONINVASIVE PHYSIOLOGIC VASCULAR STUDY OF BILATERAL LOWER EXTREMITIES TECHNIQUE: Evaluation of both lower extremities were performed at rest, including calculation of ankle-brachial indices with single level Doppler, pressure and pulse volume recording. COMPARISON:  None Available. FINDINGS: Right ABI:  1.05 Left ABI:  0.99 Right Lower Extremity: Monophasic posterior tibial and dorsalis pedis waveforms. Left Lower Extremity: Biphasic posterior tibial  and dorsalis pedis waveforms. 1.0-1.4 Normal IMPRESSION: No significant reduction in resting ankle-brachial indices bilaterally. Some abnormalities of distal waveforms may reflect some degree of underlying tibial disease bilaterally. Electronically Signed   By: Marcey Moan M.D.   On: 05/22/2023 08:41   CT FOOT LEFT W CONTRAST Result Date: 05/21/2023 CLINICAL DATA:  Diabetic foot infection with swelling and erythema and pain. First big toe cellulitis. EXAM: CT OF THE LOWER LEFT EXTREMITY WITH CONTRAST TECHNIQUE: Multidetector CT imaging of the lower left extremity was performed according to the standard protocol following intravenous contrast administration. RADIATION DOSE REDUCTION: This exam was performed according to the departmental dose-optimization program which includes automated exposure control, adjustment of the mA and/or kV according to patient size and/or use of iterative reconstruction technique. CONTRAST:  75mL OMNIPAQUE  IOHEXOL  300 MG/ML  SOLN COMPARISON:  Radiographs 05/21/2023 FINDINGS: Bones/Joint/Cartilage Severe demineralization limits sensitivity for subtle fractures and osteomyelitis. No evidence of acute fracture or osteomyelitis. Ligaments Suboptimally assessed by CT. Muscles and Tendons Marked atrophy.  No acute  abnormality. Soft tissues Soft tissue swelling about the dorsum of the foot on the big toe. No drainable fluid collection. No soft tissue gas. IMPRESSION: 1. Soft tissue swelling about the dorsum of the foot and big toe is nonspecific but can be seen with cellulitis. No abscess. 2. Severe demineralization limits sensitivity for subtle fractures and osteomyelitis. No evidence of acute fracture or osteomyelitis. If there is ongoing concern for osteomyelitis MRI is recommended Electronically Signed   By: Norman Gatlin M.D.   On: 05/21/2023 21:08        Scheduled Meds:  gabapentin   300 mg Oral BID   insulin  aspart  0-15 Units Subcutaneous TID WC   insulin  aspart  0-5 Units Subcutaneous QHS   insulin  glargine-yfgn  10 Units Subcutaneous Daily   lactulose   20 g Oral BID   lidocaine  (PF)  5 mL Intradermal Once   lithium  carbonate  300 mg Oral QHS   lithium   600 mg Oral Daily   lubiprostone   24 mcg Oral BID WC   melatonin  9 mg Oral QHS   methimazole   5 mg Oral BID   QUEtiapine   200 mg Oral QHS   QUEtiapine   50 mg Oral Daily   tamsulosin   0.4 mg Oral Daily   Continuous Infusions:  vancomycin      vancomycin  1,000 mg (05/23/23 1207)     LOS: 2 days    Time spent: over 30 min    Meliton Monte, MD [facility]   To contact the attending provider between 7A-7P or the covering provider during after hours 7P-7A, please log into the web site [email or web address] and access using universal [place] password for that web site. If you do not have the password, please call the hospital operator.  05/23/2023, 4:21 PM

## 2023-05-23 NOTE — Plan of Care (Signed)
  Problem: Education: Goal: Ability to describe self-care measures that may prevent or decrease complications (Diabetes Survival Skills Education) will improve 05/23/2023 0948 by Dionisio Tinnie LABOR, RN Outcome: Progressing 05/23/2023 0948 by Dionisio Tinnie LABOR, RN Outcome: Progressing Goal: Individualized Educational Video(s) 05/23/2023 0948 by Dionisio Tinnie LABOR, RN Outcome: Progressing 05/23/2023 0948 by Dionisio Tinnie LABOR, RN Outcome: Progressing   Problem: Education: Goal: Individualized Educational Video(s) 05/23/2023 0948 by Dionisio Tinnie LABOR, RN Outcome: Progressing 05/23/2023 0948 by Dionisio Tinnie LABOR, RN Outcome: Progressing

## 2023-05-23 NOTE — Progress Notes (Addendum)
 I was present with the medical student for this service. I personally verified the history of present illness, performed the physical exam, and made the plan for this encounter. I have verified the medical student's documentation and made modifications where appropriately. I have personally documented in my own words a brief history, physical, and plan below.     Patient seen and examined.  She is resting comfortably in bed.  She is tolerating her diet without nausea and vomiting.  She feels that her left toe is significantly improved from yesterday.  It feels less swollen and she has less pain.  Exam: Vascular: 2+ left DP and PT Given: Left great toe with wound around base of nailbed and medial aspect of nailbed with healthy pink tissue at the base, improved erythema and edema  Assessment and plan: Patient is a 68 year old female who was admitted to the hospital with cellulitis of her left great toe.  General surgery was consulted for possible I&D and culture.   -Status post incision and drainage of left great toe abscess on 1/7.  Area has significantly improved on examination today -Patient still without leukocytosis today -Wound culture demonstrating Staph aureus, will await sensitivities -Antibiotics per primary team -Continue with local wound care of left great toe, will place wound care orders -No need for acute surgical intervention at this time -Appreciate hospitalist recommendations  Claudia Brittle, DO [facility] [address] [place], [place] [address] [phone] (office)   [facility] Progress Note    Subjective: [name] is an 68 y.o. female w/ PMHx of DM, HTN, Lung Cancer (s/p RULobectomy remission since 2012) from [facility] who is admitted for soft tissue infection of left great toe. She is s/p I&D with slight debridement. Patient says that her swelling and pain have both improved along the toe and  base of the foot. Denies fever, chills, nausea, vomiting, appetite changes.  Objective: Vital signs in last 24 hours: Temp:  [98 F (36.7 C)-99.5 F (37.5 C)] 98 F (36.7 C) (01/08 0514) Pulse Rate:  [63-90] 63 (01/08 0514) Resp:  [17-20] 18 (01/08 0514) BP: (97-125)/(49-69) 105/52 (01/08 0514) SpO2:  [89 %-97 %] 97 % (01/08 0514) Weight:  [107.3 kg] 107.3 kg (01/07 1530) Last BM Date : 05/22/23  Intake/Output from previous day: 01/07 0701 - 01/08 0700 In: 785.1 [P.O.:480; IV Piggyback:305.1] Out: 2300 [Urine:2300] Intake/Output this shift: No intake/output data recorded.  General appearance: alert, cooperative, and no distress Head: Normocephalic, without obvious abnormality, atraumatic Cardio: regular rate and rhythm, S1, S2 normal, no murmur, click, rub or gallop Respiratory: Normal work of breathing, no abnormal lung grossly apparent. Pulses: Left DP: 2+ Skin: Left great toe has healthy, pink tissue s/p I&D. No surrounding erythema, excessive warmth, or drainage. There is scant dried blood under the toenail. Neuro: Decreased sensation to touch distal left foot. Foot drop on right.  Lab Results:  Recent Labs    05/22/23 0500 05/23/23 0417  WBC 5.7 5.4  HGB 12.2 12.1  HCT 39.7 38.7  PLT 207 209   BMET Recent Labs    05/22/23 0500 05/23/23 0417  NA 135 138  K 3.9 3.7  CL 101 103  CO2 29 27  GLUCOSE 156* 158*  BUN 10 12  CREATININE 0.41* 0.39*  CALCIUM  9.1 8.9   PT/INR No results for input(s): LABPROT, INR in the last 72 hours.  Studies/Results: US  ARTERIAL ABI (SCREENING LOWER EXTREMITY) Result Date: 05/22/2023 CLINICAL DATA:  Left first  toe cellulitis and infection with drainage. History of diabetes, hyperlipidemia and hypertension. EXAM: NONINVASIVE PHYSIOLOGIC VASCULAR STUDY OF BILATERAL LOWER EXTREMITIES TECHNIQUE: Evaluation of both lower extremities were performed at rest, including calculation of ankle-brachial indices with single level Doppler,  pressure and pulse volume recording. COMPARISON:  None Available. FINDINGS: Right ABI:  1.05 Left ABI:  0.99 Right Lower Extremity: Monophasic posterior tibial and dorsalis pedis waveforms. Left Lower Extremity: Biphasic posterior tibial and dorsalis pedis waveforms. 1.0-1.4 Normal IMPRESSION: No significant reduction in resting ankle-brachial indices bilaterally. Some abnormalities of distal waveforms may reflect some degree of underlying tibial disease bilaterally. Electronically Signed   By: Marcey Moan M.D.   On: 05/22/2023 08:41   CT FOOT LEFT W CONTRAST Result Date: 05/21/2023 CLINICAL DATA:  Diabetic foot infection with swelling and erythema and pain. First big toe cellulitis. EXAM: CT OF THE LOWER LEFT EXTREMITY WITH CONTRAST TECHNIQUE: Multidetector CT imaging of the lower left extremity was performed according to the standard protocol following intravenous contrast administration. RADIATION DOSE REDUCTION: This exam was performed according to the departmental dose-optimization program which includes automated exposure control, adjustment of the mA and/or kV according to patient size and/or use of iterative reconstruction technique. CONTRAST:  75mL OMNIPAQUE  IOHEXOL  300 MG/ML  SOLN COMPARISON:  Radiographs 05/21/2023 FINDINGS: Bones/Joint/Cartilage Severe demineralization limits sensitivity for subtle fractures and osteomyelitis. No evidence of acute fracture or osteomyelitis. Ligaments Suboptimally assessed by CT. Muscles and Tendons Marked atrophy.  No acute abnormality. Soft tissues Soft tissue swelling about the dorsum of the foot on the big toe. No drainable fluid collection. No soft tissue gas. IMPRESSION: 1. Soft tissue swelling about the dorsum of the foot and big toe is nonspecific but can be seen with cellulitis. No abscess. 2. Severe demineralization limits sensitivity for subtle fractures and osteomyelitis. No evidence of acute fracture or osteomyelitis. If there is ongoing concern for  osteomyelitis MRI is recommended Electronically Signed   By: Norman Gatlin M.D.   On: 05/21/2023 21:08   DG Foot Complete Left Result Date: 05/21/2023 CLINICAL DATA:  pain, redness, left great toe Possible foot injury or infection. EXAM: LEFT FOOT - COMPLETE 3+ VIEW COMPARISON:  Radiographs 02/18/2023 FINDINGS: The bones are severely demineralized. There is no evidence of acute fracture, dislocation or bone destruction. Stable postsurgical changes in the distal fibula. Stable small posterior calcaneal spur. No focal soft tissue abnormalities are identified. IMPRESSION: Stable radiographic appearance of the left foot. Severe osseous demineralization without evidence of acute fracture or osteomyelitis. Electronically Signed   By: Elsie Perone M.D.   On: 05/21/2023 16:50    Anti-infectives: Anti-infectives (From admission, onward)    Start     Dose/Rate Route Frequency Ordered Stop   05/22/23 1200  vancomycin  (VANCOCIN ) IVPB 1000 mg/200 mL premix        1,000 mg 200 mL/hr over 60 Minutes Intravenous Every 12 hours 05/21/23 2334     05/22/23 0030  vancomycin  (VANCOCIN ) IVPB 1000 mg/200 mL premix       Placed in Followed by Linked Group   1,000 mg 200 mL/hr over 60 Minutes Intravenous  Once 05/21/23 2312 05/22/23 0322   05/21/23 2315  vancomycin  (VANCOCIN ) IVPB 1000 mg/200 mL premix       Placed in Followed by Linked Group   1,000 mg 200 mL/hr over 60 Minutes Intravenous  Once 05/21/23 2312     05/21/23 2300  cefTRIAXone  (ROCEPHIN ) 2 g in sodium chloride  0.9 % 100 mL IVPB  2 g 200 mL/hr over 30 Minutes Intravenous Every 24 hours 05/21/23 2236 05/28/23 2259   05/21/23 1800  ceFAZolin  (ANCEF ) IVPB 2g/100 mL premix        2 g 200 mL/hr over 30 Minutes Intravenous  Once 05/21/23 1753 05/21/23 2225       Assessment/Plan: s/p I&D left great toe [name] is a 68 year old female who resides at [facility] w/ PMHx of T2DM on insulin , HTN, DVT, Lung Ca (remission 2012) who  has had left great toe soft tissue infection s/p I&D 1/7 and admission for IV antibiotics. She is stable, afebrile, and feels improved. No systemic symptoms. Culture shows abundant staph aureus.   #Skin/soft tissue infection of the left great toe - WBC 5.4, afebrile. This is her first toe infection. Appears to be superficial infection. Low concern for osteo or bacteremia. - Continue with antibiotics -staph aureus susceptibilities pending - Wound appears healthy and healing   LOS: 2 days    [name] 05/23/2023

## 2023-05-24 DIAGNOSIS — L03032 Cellulitis of left toe: Secondary | ICD-10-CM | POA: Diagnosis not present

## 2023-05-24 LAB — GLUCOSE, CAPILLARY
Glucose-Capillary: 169 mg/dL — ABNORMAL HIGH (ref 70–99)
Glucose-Capillary: 184 mg/dL — ABNORMAL HIGH (ref 70–99)
Glucose-Capillary: 178 mg/dL — ABNORMAL HIGH (ref 70–99)
Glucose-Capillary: 228 mg/dL — ABNORMAL HIGH (ref 70–99)

## 2023-05-24 LAB — CBC WITH DIFFERENTIAL/PLATELET
Abs Immature Granulocytes: 0.01 10*3/uL (ref 0.00–0.07)
Basophils Absolute: 0 10*3/uL (ref 0.0–0.1)
Basophils Relative: 0 %
Eosinophils Absolute: 0.2 10*3/uL (ref 0.0–0.5)
Eosinophils Relative: 3 %
HCT: 39.6 % (ref 36.0–46.0)
Hemoglobin: 12.5 g/dL (ref 12.0–15.0)
Immature Granulocytes: 0 %
Lymphocytes Relative: 20 %
Lymphs Abs: 1.2 10*3/uL (ref 0.7–4.0)
MCH: 30 pg (ref 26.0–34.0)
MCHC: 31.6 g/dL (ref 30.0–36.0)
MCV: 95 fL (ref 80.0–100.0)
Monocytes Absolute: 0.5 10*3/uL (ref 0.1–1.0)
Monocytes Relative: 8 %
Neutro Abs: 4 10*3/uL (ref 1.7–7.7)
Neutrophils Relative %: 69 %
Platelets: 221 10*3/uL (ref 150–400)
RBC: 4.17 MIL/uL (ref 3.87–5.11)
RDW: 13.5 % (ref 11.5–15.5)
WBC: 5.9 10*3/uL (ref 4.0–10.5)
nRBC: 0 % (ref 0.0–0.2)

## 2023-05-24 LAB — COMPREHENSIVE METABOLIC PANEL
ALT: 18 U/L (ref 0–44)
AST: 10 U/L — ABNORMAL LOW (ref 15–41)
Albumin: 3.1 g/dL — ABNORMAL LOW (ref 3.5–5.0)
Alkaline Phosphatase: 92 U/L (ref 38–126)
Anion gap: 8 (ref 5–15)
BUN: 14 mg/dL (ref 8–23)
CO2: 28 mmol/L (ref 22–32)
Calcium: 9.3 mg/dL (ref 8.9–10.3)
Chloride: 101 mmol/L (ref 98–111)
Creatinine, Ser: 0.46 mg/dL (ref 0.44–1.00)
GFR, Estimated: >60 mL/min (ref >60)
Glucose, Bld: 164 mg/dL — ABNORMAL HIGH (ref 70–99)
Potassium: 3.7 mmol/L (ref 3.5–5.1)
Sodium: 137 mmol/L (ref 135–145)
Total Bilirubin: 0.3 mg/dL (ref 0.0–1.2)
Total Protein: 6.7 g/dL (ref 6.5–8.1)

## 2023-05-24 LAB — TSH: TSH: 9.712 u[IU]/mL — ABNORMAL HIGH (ref 0.350–4.500)

## 2023-05-24 LAB — MAGNESIUM: Magnesium: 2.1 mg/dL (ref 1.7–2.4)

## 2023-05-24 LAB — PHOSPHORUS: Phosphorus: 4.4 mg/dL (ref 2.5–4.6)

## 2023-05-24 MED ORDER — DOXYCYCLINE HYCLATE 100 MG PO TABS: 100.0000 mg | ORAL_TABLET | Freq: Two times a day (BID) | ORAL | 0 refills | Status: AC

## 2023-05-24 MED ORDER — DOXYCYCLINE HYCLATE 100 MG PO TABS
100.0000 mg | ORAL_TABLET | Freq: Two times a day (BID) | ORAL | Status: DC
  Administered 2023-05-24 (×2): 100 mg via ORAL
  Filled 2023-05-24 (×2): qty 1

## 2023-05-24 NOTE — Progress Notes (Signed)
 Spoke with [place] at [facility] and provided report.

## 2023-05-24 NOTE — TOC Transition Note (Signed)
 Transition of Care [facility]) - Discharge Note   Patient Details  Name: [name] MRN: [id number] Date of Birth: February 02, 1956  [facility] CM/SW Contact:  Noreen KATHEE Cleotilde ISRAEL Phone Number: 05/24/2023, 4:43 PM   Clinical Narrative:     Patient is DC today. Spoke with son about pt DC back to [facility] today . Patient was also made aware and shared that she her bed was given away and she did not think that was far. Debbie with CV is aware and has already obtained DC summary in pt chart. EMS was contacted. Nurse made aware of completed Med necessity form, room number pt going to ( B1-1) and call report number, [phone]. TOC signing off.   Final next level of care: Long Term Acute Care (LTAC) Barriers to Discharge: Barriers Resolved   Patient Goals and CMS Choice Patient states their goals for this hospitalization and ongoing recovery are:: Return back to [facility].gov Compare Post Acute Care list provided to::  (Spoke with son) Choice offered to / list presented to : Adult Children      Discharge Placement                Patient to be transferred to facility by: Ambulance Name of family member notified: Spoke with son Patient and family notified of of transfer: 05/24/23  Discharge Plan and Services Additional resources added to the After Visit Summary for        Social Drivers of Health (SDOH) Interventions SDOH Screenings   Food Insecurity: No Food Insecurity (05/21/2023)  Housing: Low Risk  (05/22/2023)  Transportation Needs: No Transportation Needs (05/21/2023)  Utilities: Not At Risk (05/21/2023)  Alcohol Screen: Low Risk  (11/14/2017)  Social Connections: Socially Isolated (05/22/2023)  Tobacco Use: Medium Risk (05/21/2023)     Readmission Risk Interventions    05/24/2023    4:41 PM 05/22/2023    2:07 PM 05/02/2023    8:07 AM  Readmission Risk Prevention Plan  Transportation Screening Complete Complete Complete  Medication Review Special Educational Needs Teacher) Complete Complete Complete  PCP or Specialist appointment within 3-5 days of discharge  Complete   HRI or Home Care Consult Complete Complete Complete  SW Recovery Care/Counseling Consult Complete Complete Complete  Palliative Care Screening Not Applicable Not Applicable Not Applicable  Skilled Nursing Facility Complete Complete Complete

## 2023-05-24 NOTE — Discharge Summary (Signed)
 Physician Discharge Summary  [name] FMW:[id number] DOB: Aug 11, 1955 DOA: 05/21/2023  PCP: Patient, [name] Per  Admit date: 05/21/2023 Discharge date: 05/24/2023  Time spent: 40 minutes  Recommendations for Outpatient Follow-up:  Follow outpatient CBC/CMP  Needs podiatry follow up for left toe wound and ingrown toenail Continue wound care as recommended by surgery Stop methimazole .  Repeat TSH, free T4 in 4-6 weeks, consider endocrine referral with concern for hyperthyroidism and unclear diagnosis.    Discharge Diagnoses:  Principal Problem:   Cellulitis of left toe Active Problems:   MDD (major depressive disorder), recurrent severe, without psychosis (HCC)   DVT (deep venous thrombosis) 09/08/22   HTN (hypertension)   Diabetes (HCC)   Abscess of left great toe   Discharge Condition: stable  Diet recommendation: heart healthy, diabetic  Filed Weights   05/21/23 2300 05/22/23 1530  Weight: 104.2 kg 107.3 kg    History of present illness:   [name] is [name] 68 y.o. female with medical history significant for diabetes mellitus, DVT, on chronic anticoagulation, hypertension, stage II lung cancer. Patient presented to the ED with complaints of redness pain and swelling to the big toe of her left foot, symptoms started [name] week ago, but reports started looking worse over the past 24 hours.  Patient was admitted for treatment of cellulitis of the left great toe and has undergone I/D on 1/7.  Wound culture growing MRSA sensitive to tetracyclines.  Will discharge on doxy.  Needs podiatry follow up.  See below for additional details  Hospital Course:  Assessment and Plan:  Cellulitis of left toe -No signs of osteomyelitis -ABI without significant reduction in resting ABI's bilaterally (abnormalities of distal waveforms may reflect some degree of underlying tibial disease bilaterally) -with underlying diabetes, should have low index of suspicion for osteo -> CRP 2.7, sed rate  35.   -MRSA on culture -> sensitive to tetracycline -> will plan for doxy x 10 additional days at discharge -appreciate surgery assistance -looks like she has ingrown toenail, this needs to be managed outpatient - needs podiatry follow up   Wound Care Instructions: -Change dressing daily -Wash foot daily with soap and water -Pat left great toe wound dry -Wrap with Xeroform gauzeto cover open wound and then wrap 4 x 4 around left great toe.   -Wrap foot to ankle with Kerlix  Diabetes (HCC) with mild hyperglycemia Controlled.  A1c 10.9. Resume home diabetic regimen, basal and SSI + metformin    HTN (hypertension) Stable.  Not on antihypertensives.     Concern for Hyperthyroidism  methimazole  seems to have been started 02/14/2023 Not clear diagnosis -> TSH was not completely suppressed at the time of diagnosis Repeat TSH is 9.712.  Will stop methimazole . Needs repeat TSH/free T4 in 4-6 weeks.  Likely will benefit from endocrine follow up at well.      DVT (deep venous thrombosis) 09/08/22 eliquis    MDD (major depressive disorder), recurrent severe, without psychosis (HCC) Resume quetiapine , lithium    Obesity, class I Body mass index is 34.93 kg/m.     Procedures: I&D   Consultations: surgery  Discharge Exam: Vitals:   05/24/23 0516 05/24/23 1412  BP: (!) 140/55 (!) 137/58  Pulse: 73 72  Resp: 18 17  Temp: 97.8 F (36.6 C)   SpO2: 96% 97%   Still some toe pain Ok with discharge today  General: No acute distress. Cardiovascular: RRR Lungs: unlabored Neurological: Alert and oriented 3. Moves all extremities 4 with equal strength. Cranial  nerves II through XII grossly intact. Extremities: L first toe with erythema, mild TTP, ingrown toenail on medial toe  Discharge Instructions   Discharge Instructions     Call MD for:  difficulty breathing, headache or visual disturbances   Complete by: As directed    Call MD for:  extreme fatigue   Complete by: As  directed    Call MD for:  hives   Complete by: As directed    Call MD for:  persistant dizziness or light-headedness   Complete by: As directed    Call MD for:  persistant nausea and vomiting   Complete by: As directed    Call MD for:  redness, tenderness, or signs of infection (pain, swelling, redness, odor or green/yellow discharge around incision site)   Complete by: As directed    Call MD for:  severe uncontrolled pain   Complete by: As directed    Call MD for:  temperature >100.4   Complete by: As directed    Diet - low sodium heart healthy   Complete by: As directed    Discharge instructions   Complete by: As directed    You were seen for [name] skin infection of your left big toe.  This was treated with an incision and drainage by general surgery.  You'll need continued antibiotics and follow up with podiatry outpatient (I think you have an ingrown toenail that warrants additional outpatient management).  We'll send you home with 10 days of doxycycline .  We'll continue the current wound care.  I'm stopping your methimazole .  You'll need repeat thyroid  labs in [name] few weeks and possibly endocrine follow up.  Return for new, recurrent, or worsening symptoms.  Please ask your PCP to request records from this hospitalization so they know what was done and what the next steps will be.   Discharge wound care:   Complete by: As directed    wrapped toe with Xeroform gauze, then wrapped with 4 x 4's, and [name] Kerlix wrap.   Increase activity slowly   Complete by: As directed       Allergies as of 05/24/2023       Reactions   Sulfa Antibiotics Swelling, Rash   Neck swelling        Medication List     STOP taking these medications    doxycycline  100 MG capsule Commonly known as: MONODOX    methimazole  5 MG tablet Commonly known as: TAPAZOLE        TAKE these medications    acetaminophen  325 MG tablet Commonly known as: TYLENOL  Take 650 mg by mouth every 4 (four) hours as  needed for mild pain.   ascorbic acid  500 MG tablet Commonly known as: VITAMIN C  Take 500 mg by mouth 2 (two) times daily.   Basaglar  KwikPen 100 UNIT/ML Inject 10 Units into the skin daily.   bisacodyl  10 MG suppository Commonly known as: DULCOLAX Place 10 mg rectally as needed for moderate constipation.   calcium  carbonate 500 MG chewable tablet Commonly known as: TUMS - dosed in mg elemental calcium  Chew 1 tablet by mouth every 5 (five) hours as needed for indigestion or heartburn.   cetirizine 10 MG tablet Commonly known as: ZYRTEC Take 10 mg by mouth daily. For allergies   diclofenac  Sodium 1 % Gel Commonly known as: VOLTAREN  Apply 2 g topically 4 (four) times daily.   doxycycline  100 MG tablet Commonly known as: VIBRA -TABS Take 1 tablet (100 mg total) by mouth every 12 (twelve) hours  for 10 days.   Eliquis  5 MG Tabs tablet Generic drug: apixaban  Take 5 mg by mouth 2 (two) times daily.   Fiasp  FlexTouch 100 UNIT/ML FlexTouch Pen Generic drug: insulin  aspart Inject 0-10 Units into the skin 4 (four) times daily -  before meals and at bedtime. Short acting insulin  (Aspart) per sliding scale 0-10 ---:-- Insulin  injection 0-10 Units 0-10 Units Subcutaneous, 3 times daily with meals CBG 70 - 120: 0 unit CBG 121 - 150: 0 unit  CBG 151 - 200: 1 unit CBG 201 - 250: 2 units CBG 251 - 300: 4 units CBG 301 - 350: 6 units  CBG 351 - 400: 8 units  CBG > 400: 10 units   folic acid  1 MG tablet Commonly known as: FOLVITE  Take 1 mg by mouth daily.   gabapentin  300 MG capsule Commonly known as: NEURONTIN  Take 300 mg by mouth 2 (two) times daily.   LACTOBACILLUS PO Take 1 capsule by mouth daily.   lactulose  10 GM/15ML solution Commonly known as: CHRONULAC  Take 10 g by mouth daily as needed for moderate constipation.   lactulose  10 GM/15ML solution Commonly known as: CHRONULAC  Take 30 mLs by mouth 2 (two) times daily.   lidocaine  5 % Commonly known as: LIDODERM  Place 1 patch  onto the skin daily. Remove & Discard patch within 12 hours or as directed by MD; Apply to LOWER LEG What changed: Another medication with the same name was removed. Continue taking this medication, and follow the directions you see here.   lithium  carbonate 300 MG capsule Take 300 mg by mouth at bedtime.   lithium  600 MG capsule Take 600 mg by mouth daily.   loperamide  2 MG tablet Commonly known as: IMODIUM  Bernd Crom-D Take 2 mg by mouth every 6 (six) hours as needed for diarrhea or loose stools.   lubiprostone  24 MCG capsule Commonly known as: AMITIZA  Take 24 mcg by mouth 2 (two) times daily with Derrian Rodak meal.   Melatonin 10 MG Tabs Take 10 mg by mouth at bedtime.   metFORMIN  500 MG tablet Commonly known as: GLUCOPHAGE  Take 1 pill Ishmael Berkovich day What changed:  how much to take how to take this when to take this   MULTIVITAMIN ADULT PO Take 1 tablet by mouth daily.   ondansetron  4 MG tablet Commonly known as: ZOFRAN  Take 4 mg by mouth every 8 (eight) hours as needed.   oxyCODONE -acetaminophen  10-325 MG tablet Commonly known as: PERCOCET Take 1 tablet by mouth every 6 (six) hours as needed for pain. What changed: when to take this   phenol 1.4 % Liqd Commonly known as: CHLORASEPTIC Use as directed 5 sprays in the mouth or throat every 2 (two) hours as needed for throat irritation / pain.   polyethylene glycol 17 g packet Commonly known as: MIRALAX  / GLYCOLAX  Take 17 g by mouth 2 (two) times daily.   QUEtiapine  100 MG tablet Commonly known as: SEROQUEL  Take 200 mg by mouth at bedtime.   QUEtiapine  25 MG tablet Commonly known as: SEROQUEL  Take 50 mg by mouth daily.   tamsulosin  0.4 MG Caps capsule Commonly known as: FLOMAX  Take 0.4 mg by mouth daily.   triamcinolone 0.025 % cream Commonly known as: KENALOG Apply 1 Application topically 2 (two) times daily. Apply to R ear external canal for dermatitis for 4 weeks.               Discharge Care Instructions  (From  admission, onward)  Start     Ordered   05/24/23 0000  Discharge wound care:       Comments: wrapped toe with Xeroform gauze, then wrapped with 4 x 4's, and Roseanne Juenger Kerlix wrap.   05/24/23 1529           Allergies  Allergen Reactions   Sulfa Antibiotics Swelling and Rash    Neck swelling      The results of significant diagnostics from this hospitalization (including imaging, microbiology, ancillary and laboratory) are listed below for reference.    Significant Diagnostic Studies: US  ARTERIAL ABI (SCREENING LOWER EXTREMITY) Result Date: 05/22/2023 CLINICAL DATA:  Left first toe cellulitis and infection with drainage. History of diabetes, hyperlipidemia and hypertension. EXAM: NONINVASIVE PHYSIOLOGIC VASCULAR STUDY OF BILATERAL LOWER EXTREMITIES TECHNIQUE: Evaluation of both lower extremities were performed at rest, including calculation of ankle-brachial indices with single level Doppler, pressure and pulse volume recording. COMPARISON:  None Available. FINDINGS: Right ABI:  1.05 Left ABI:  0.99 Right Lower Extremity: Monophasic posterior tibial and dorsalis pedis waveforms. Left Lower Extremity: Biphasic posterior tibial and dorsalis pedis waveforms. 1.0-1.4 Normal IMPRESSION: No significant reduction in resting ankle-brachial indices bilaterally. Some abnormalities of distal waveforms may reflect some degree of underlying tibial disease bilaterally. Electronically Signed   By: Marcey Moan M.D.   On: 05/22/2023 08:41   CT FOOT LEFT W CONTRAST Result Date: 05/21/2023 CLINICAL DATA:  Diabetic foot infection with swelling and erythema and pain. First big toe cellulitis. EXAM: CT OF THE LOWER LEFT EXTREMITY WITH CONTRAST TECHNIQUE: Multidetector CT imaging of the lower left extremity was performed according to the standard protocol following intravenous contrast administration. RADIATION DOSE REDUCTION: This exam was performed according to the departmental dose-optimization program  which includes automated exposure control, adjustment of the mA and/or kV according to patient size and/or use of iterative reconstruction technique. CONTRAST:  75mL OMNIPAQUE  IOHEXOL  300 MG/ML  SOLN COMPARISON:  Radiographs 05/21/2023 FINDINGS: Bones/Joint/Cartilage Severe demineralization limits sensitivity for subtle fractures and osteomyelitis. No evidence of acute fracture or osteomyelitis. Ligaments Suboptimally assessed by CT. Muscles and Tendons Marked atrophy.  No acute abnormality. Soft tissues Soft tissue swelling about the dorsum of the foot on the big toe. No drainable fluid collection. No soft tissue gas. IMPRESSION: 1. Soft tissue swelling about the dorsum of the foot and big toe is nonspecific but can be seen with cellulitis. No abscess. 2. Severe demineralization limits sensitivity for subtle fractures and osteomyelitis. No evidence of acute fracture or osteomyelitis. If there is ongoing concern for osteomyelitis MRI is recommended Electronically Signed   By: Norman Gatlin M.D.   On: 05/21/2023 21:08   DG Foot Complete Left Result Date: 05/21/2023 CLINICAL DATA:  pain, redness, left great toe Possible foot injury or infection. EXAM: LEFT FOOT - COMPLETE 3+ VIEW COMPARISON:  Radiographs 02/18/2023 FINDINGS: The bones are severely demineralized. There is no evidence of acute fracture, dislocation or bone destruction. Stable postsurgical changes in the distal fibula. Stable small posterior calcaneal spur. No focal soft tissue abnormalities are identified. IMPRESSION: Stable radiographic appearance of the left foot. Severe osseous demineralization without evidence of acute fracture or osteomyelitis. Electronically Signed   By: Elsie Perone M.D.   On: 05/21/2023 16:50   DG Abd 1 View Result Date: 05/04/2023 CLINICAL DATA:  Impacted stool in rectum. EXAM: ABDOMEN - 1 VIEW COMPARISON:  None Available. FINDINGS: The bowel gas pattern is normal. Mild amount of stool is seen in the sigmoid colon  and rectum. No radio-opaque calculi or  other significant radiographic abnormality are seen. IMPRESSION: Mild amount of stool is seen.  No abnormal bowel dilatation. Electronically Signed   By: Lynwood Landy Raddle M.D.   On: 05/04/2023 11:07   US  THYROID  Result Date: 05/03/2023 CLINICAL DATA:  Right thyroid  nodule EXAM: THYROID  ULTRASOUND TECHNIQUE: Ultrasound examination of the thyroid  gland and adjacent soft tissues was performed. COMPARISON:  Ultrasound thyroid  02/14/2022 CT chest abdomen pelvis 05/01/2023 FINDINGS: Parenchymal Echotexture: Mildly heterogenous Isthmus: 0.2 cm Right lobe: 5.2 x 2.5 x 3.2 cm Left lobe: 4.7 x 1.5 x 1.6 cm _________________________________________________________ Estimated total number of nodules >/= 1 cm: 2 Number of spongiform nodules >/=  2 cm not described below (TR1): 0 Number of mixed cystic and solid nodules >/= 1.5 cm not described below (TR2): 0 _________________________________________________________ Nodule # 1: Location: Isthmus; Mid Maximum size: 1.0 cm; Other 2 dimensions: 0.7 x 0.5 cm Composition: solid/almost completely solid (2) Echogenicity: hypoechoic (2) Shape: not taller-than-wide (0) Margins: smooth (0) Echogenic foci: none (0) ACR TI-RADS total points: 4. ACR TI-RADS risk category: TR4 (4-6 points). ACR TI-RADS recommendations: *Given size (>/= 1 - 1.4 cm) and appearance, Railey Glad follow-up ultrasound in 1 year should be considered based on TI-RADS criteria. _________________________________________________________ Nodule 2: 3.3 x 3.0 x 2.4 cm mixed solid cystic isoechoic right mid thyroid  nodule (TI-RADS 2) does not meet criteria for imaging surveillance or FNA. _________________________________________________________ Subcentimeter left thyroid  nodules do not meet criteria for FNA or imaging follow-up. IMPRESSION: 1. Mixed solid cystic right mid thyroid  nodule corresponds to the abnormality seen on CT and does not meet criteria for FNA or imaging follow-up. 2. Nodule  1 (TI-RADS 4), measuring 1.0 cm, located in the isthmus meets criteria for imaging follow-up. Annual ultrasound surveillance is recommended until 5 years of stability is documented. The above is in keeping with the ACR TI-RADS recommendations - J Am Coll Radiol 2017;14:587-595. Electronically Signed   By: Aliene Lloyd M.D.   On: 05/03/2023 08:25   CT CHEST ABDOMEN PELVIS W CONTRAST Result Date: 05/01/2023 CLINICAL DATA:  Sepsis EXAM: CT CHEST, ABDOMEN, AND PELVIS WITH CONTRAST TECHNIQUE: Multidetector CT imaging of the chest, abdomen and pelvis was performed following the standard protocol during bolus administration of intravenous contrast. RADIATION DOSE REDUCTION: This exam was performed according to the departmental dose-optimization program which includes automated exposure control, adjustment of the mA and/or kV according to patient size and/or use of iterative reconstruction technique. CONTRAST:  OMNIPAQUE  IOHEXOL  300 MG/ML  SOLN COMPARISON:  01/24/2023, 11/13/2014, 09/05/2013 FINDINGS: CT CHEST FINDINGS Cardiovascular: Heart size is upper limits of normal. No pericardial effusion. Thoracic aorta is nonaneurysmal. Scattered aortic atherosclerosis. Central pulmonary vasculature is within normal limits. Mediastinum/Nodes: No axillary, mediastinal, or hilar lymphadenopathy. 2.6 cm low-attenuation nodule in the right thyroid  lobe. Trachea and esophagus demonstrate no significant abnormality. Lungs/Pleura: Postsurgical changes within the right upper lobe. Minor left basilar atelectasis. Lungs are otherwise clear. No pleural effusion or pneumothorax. Musculoskeletal: No chest wall mass or suspicious bone lesions identified. Exaggerated thoracic kyphosis. CT ABDOMEN PELVIS FINDINGS Hepatobiliary: Liver measures 22 cm in length. No focal liver abnormality is seen. No gallstones, gallbladder wall thickening, or biliary dilatation. Pancreas: Unremarkable. No pancreatic ductal dilatation or surrounding  inflammatory changes. Spleen: Normal in size without focal abnormality. Adrenals/Urinary Tract: Unremarkable adrenal glands. Longstanding stable hyperattenuating lesion along the medial aspect of the left kidney measuring 2.0 cm which requires no dedicated follow-up imaging. Tiny nonobstructing left renal stone. Right kidney is unremarkable. No hydronephrosis. Urinary bladder within normal limits  for the degree of distension. Stomach/Bowel: Moderate-large volume stool throughout the colon. Rectum is dilated with Mallory Enriques large stool ball measuring 9 cm in diameter. Mild circumferential rectal wall thickening and mild fat stranding. Tiny hiatal hernia. No dilated loops of small bowel to suggest obstruction. Vascular/Lymphatic: Aortic atherosclerosis. No enlarged abdominal or pelvic lymph nodes. Reproductive: Atrophic uterus.  No adnexal masses. Other: No free fluid. No abdominopelvic fluid collection. No pneumoperitoneum. No abdominal wall hernia. Musculoskeletal: Incidentally noted 7 cm lipoma anterior to the left hip. No acute osseous abnormality. IMPRESSION: 1. Moderate-large volume stool throughout the colon. Rectum is dilated with Lanelle Lindo large stool ball measuring 9 cm in diameter. Mild circumferential rectal wall thickening and mild fat stranding, suggestive of stercoral colitis. 2. No acute intrathoracic findings. 3. Tiny nonobstructing left renal stone. 4. Garv Kuechle 2.6 cm low-attenuation nodule in the right thyroid  lobe. Recommend thyroid  US  (ref: J Am Coll Radiol. 2015 Feb;12(2): 143-50). 5. Hepatomegaly. 6. Aortic atherosclerosis (ICD10-I70.0). Electronically Signed   By: Mabel Converse D.O.   On: 05/01/2023 20:54   DG Chest Port 1 View Result Date: 05/01/2023 CLINICAL DATA:  Sepsis.  Constipation. EXAM: PORTABLE CHEST 1 VIEW COMPARISON:  X-ray 02/18/2023. FINDINGS: Kyphotic x-ray obscures the apices. Film is also rotated. Stable enlarged cardiopericardial silhouette with tortuous ectatic aorta. No pneumothorax or  effusion. No edema. There is some linear opacity left lung base likely scar or atelectasis. Overlapping cardiac leads. Film is under penetrated. IMPRESSION: Limited radiograph.  Stable enlarged cardiopericardial silhouette. Left basilar scar or atelectasis.  No consolidation Electronically Signed   By: Ranell Bring M.D.   On: 05/01/2023 18:11    Microbiology: Recent Results (from the past 240 hours)  Aerobic/Anaerobic Culture w Gram Stain (surgical/deep wound)     Status: None (Preliminary result)   Collection Time: 05/22/23 10:09 AM   Specimen: Abscess  Result Value Ref Range Status   Specimen Description ABSCESS TOE LEFT  Final   Gram Stain   Final    RARE WBC PRESENT, PREDOMINANTLY PMN RARE GRAM POSITIVE COCCI Performed at [facility], [address], [place], [place] [address]    Culture   Final    ABUNDANT METHICILLIN RESISTANT STAPHYLOCOCCUS AUREUS NO ANAEROBES ISOLATED; CULTURE IN PROGRESS FOR 5 DAYS    Report Status PENDING  Incomplete   Organism ID, Bacteria METHICILLIN RESISTANT STAPHYLOCOCCUS AUREUS  Final      Susceptibility   Methicillin resistant staphylococcus aureus - MIC*    CIPROFLOXACIN  >=8 RESISTANT Resistant     ERYTHROMYCIN >=8 RESISTANT Resistant     GENTAMICIN <=0.5 SENSITIVE Sensitive     OXACILLIN >=4 RESISTANT Resistant     TETRACYCLINE <=1 SENSITIVE Sensitive     VANCOMYCIN  1 SENSITIVE Sensitive     TRIMETH/SULFA <=10 SENSITIVE Sensitive     CLINDAMYCIN <=0.25 SENSITIVE Sensitive     RIFAMPIN <=0.5 SENSITIVE Sensitive     Inducible Clindamycin NEGATIVE Sensitive     LINEZOLID 2 SENSITIVE Sensitive     * ABUNDANT METHICILLIN RESISTANT STAPHYLOCOCCUS AUREUS     Labs: Basic Metabolic Panel: Recent Labs  Lab 05/21/23 1650 05/22/23 0500 05/23/23 0417 05/24/23 0406  NA 136 135 138 137  K 3.9 3.9 3.7 3.7  CL 104 101 103 101  CO2 27 29 27 28   GLUCOSE 128* 156* 158* 164*  BUN 10 10 12 14   CREATININE 0.40* 0.41* 0.39* 0.46  CALCIUM  9.2 9.1  8.9 9.3  MG  --   --  2.1 2.1  PHOS  --   --   --  4.4   Liver Function Tests: Recent Labs  Lab 05/21/23 1650 05/24/23 0406  AST 13* 10*  ALT 25 18  ALKPHOS 91 92  BILITOT 0.4 0.3  PROT 6.6 6.7  ALBUMIN 3.1* 3.1*   No results for input(s): LIPASE, AMYLASE in the last 168 hours. No results for input(s): AMMONIA in the last 168 hours. CBC: Recent Labs  Lab 05/21/23 1650 05/22/23 0500 05/23/23 0417 05/24/23 0406  WBC 6.2 5.7 5.4 5.9  NEUTROABS 4.4  --   --  4.0  HGB 12.4 12.2 12.1 12.5  HCT 39.1 39.7 38.7 39.6  MCV 94.9 95.7 95.3 95.0  PLT 200 207 209 221   Cardiac Enzymes: No results for input(s): CKTOTAL, CKMB, CKMBINDEX, TROPONINI in the last 168 hours. BNP: BNP (last 3 results) Recent Labs    01/24/23 2015  BNP 20.0    ProBNP (last 3 results) No results for input(s): PROBNP in the last 8760 hours.  CBG: Recent Labs  Lab 05/23/23 1056 05/23/23 1621 05/23/23 2056 05/24/23 0750 05/24/23 1139  GLUCAP 214* 202* 263* 169* 184*       Signed:  Meliton Monte MD.  [facility] 05/24/2023, 3:30 PM

## 2023-05-24 NOTE — Progress Notes (Signed)
 PROGRESS NOTE    [name]  FMW:[id number] DOB: Sep 01, 1955 DOA: 05/21/2023 PCP: Patient, [name] Per  Chief Complaint  Patient presents with   Toe Injury    Pt reports she has been having issues with her toes for about [name] week, and it has been looking the way it looks now for about one day. No injury to foot (pt does not walk).     Brief Narrative:   [name] is [name] 68 y.o. female with medical history significant for diabetes mellitus, DVT, on chronic anticoagulation, hypertension, stage II lung cancer. Patient presented to the ED with complaints of redness pain and swelling to the big toe of her left foot, symptoms started [name] week ago, but reports started looking worse over the past 24 hours.  Patient was admitted for treatment of cellulitis of the left great toe and has undergone I/D on 1/7.  Assessment & Plan:   Principal Problem:   Cellulitis of left toe Active Problems:   MDD (major depressive disorder), recurrent severe, without psychosis (HCC)   DVT (deep venous thrombosis) 09/08/22   HTN (hypertension)   Diabetes (HCC)   Abscess of left great toe  Cellulitis of left toe -No signs of osteomyelitis -ABI without significant reduction in resting ABI's bilaterally (abnormalities of distal waveforms may reflect some degree of underlying tibial disease bilaterally) -with underlying diabetes, should have low index of suspicion for osteo -> CRP 2.7, sed rate 35.   -MRSA on culture -> sensitive to tetracycline -> will plan for doxy -looks like she has ingrown toenail, this needs to be managed outpatient    Diabetes (HCC) with mild hyperglycemia Controlled.  A1c 10.9. - SSI- M -Hold home Lantus  10 units daily -Hold home metformin    HTN (hypertension) Stable.  Not on antihypertensives.    Concern for Hyperthyroidism  methimazole  seems to have been started 02/14/2023 Not clear diagnosis -> TSH was not completely suppressed at the time of diagnosis Repeat TSH is 9.712.   Will stop methimazole . Needs repeat TSH/free T4 in 4-6 weeks.  Likely will benefit from endocrine follow up at well.     DVT (deep venous thrombosis) 09/08/22 Will resume eliquis    MDD (major depressive disorder), recurrent severe, without psychosis (HCC) Resume quetiapine , lithium    Obesity, class I Body mass index is 34.93 kg/m.    DVT prophylaxis: SCD Code Status: full Family Communication: none Disposition:   Status is: Inpatient Remains inpatient appropriate because: need for continued inpatient care   Consultants:  surgery  Procedures:  05/23/2023  Incision and drainage of left great toe abscess   Antimicrobials:  Anti-infectives (From admission, onward)    Start     Dose/Rate Route Frequency Ordered Stop   05/24/23 1300  doxycycline  (VIBRA -TABS) tablet 100 mg        100 mg Oral Every 12 hours 05/24/23 1202     05/22/23 1200  vancomycin  (VANCOCIN ) IVPB 1000 mg/200 mL premix  Status:  Discontinued        1,000 mg 200 mL/hr over 60 Minutes Intravenous Every 12 hours 05/21/23 2334 05/24/23 1202   05/22/23 0030  vancomycin  (VANCOCIN ) IVPB 1000 mg/200 mL premix       Placed in Followed by Linked Group   1,000 mg 200 mL/hr over 60 Minutes Intravenous  Once 05/21/23 2312 05/22/23 0322   05/21/23 2315  vancomycin  (VANCOCIN ) IVPB 1000 mg/200 mL premix  Status:  Discontinued       Placed in Followed by Linked Group  1,000 mg 200 mL/hr over 60 Minutes Intravenous  Once 05/21/23 2312 05/24/23 1202   05/21/23 2300  cefTRIAXone  (ROCEPHIN ) 2 g in sodium chloride  0.9 % 100 mL IVPB  Status:  Discontinued        2 g 200 mL/hr over 30 Minutes Intravenous Every 24 hours 05/21/23 2236 05/23/23 1208   05/21/23 1800  ceFAZolin  (ANCEF ) IVPB 2g/100 mL premix        2 g 200 mL/hr over 30 Minutes Intravenous  Once 05/21/23 1753 05/21/23 2225       Subjective: No new complaints  Objective: Vitals:   05/23/23 0514 05/23/23 1453 05/23/23 2026 05/24/23 0516  BP: (!) 105/52  101/74 (!) 130/52 (!) 140/55  Pulse: 63 87 77 73  Resp: 18  18 18   Temp: 98 F (36.7 C) 98.6 F (37 C) 98 F (36.7 C) 97.8 F (36.6 C)  TempSrc:  Oral Oral   SpO2: 97% 95% 96% 96%  Weight:      Height:        Intake/Output Summary (Last 24 hours) at 05/24/2023 1350 Last data filed at 05/24/2023 0830 Gross per 24 hour  Intake 720 ml  Output 1600 ml  Net -880 ml   Filed Weights   05/21/23 2300 05/22/23 1530  Weight: 104.2 kg 107.3 kg    Examination:  General: No acute distress. Cardiovascular: RRR Lungs: unlabored Neurological: Alert and oriented 3. 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 Cranial nerves II through XII grossly intact. Extremities: LLE first toe with some redness and TTP, looks like ingrown toenail on medial side    Data Reviewed: I have personally reviewed following labs and imaging studies  CBC: Recent Labs  Lab 05/21/23 1650 05/22/23 0500 05/23/23 0417 05/24/23 0406  WBC 6.2 5.7 5.4 5.9  NEUTROABS 4.4  --   --  4.0  HGB 12.4 12.2 12.1 12.5  HCT 39.1 39.7 38.7 39.6  MCV 94.9 95.7 95.3 95.0  PLT 200 207 209 221    Basic Metabolic Panel: Recent Labs  Lab 05/21/23 1650 05/22/23 0500 05/23/23 0417 05/24/23 0406  NA 136 135 138 137  K 3.9 3.9 3.7 3.7  CL 104 101 103 101  CO2 27 29 27 28   GLUCOSE 128* 156* 158* 164*  BUN 10 10 12 14   CREATININE 0.40* 0.41* 0.39* 0.46  CALCIUM  9.2 9.1 8.9 9.3  MG  --   --  2.1 2.1  PHOS  --   --   --  4.4    GFR: Estimated Creatinine Clearance: 91.4 mL/min (by C-G formula based on SCr of 0.46 mg/dL).  Liver Function Tests: Recent Labs  Lab 05/21/23 1650 05/24/23 0406  AST 13* 10*  ALT 25 18  ALKPHOS 91 92  BILITOT 0.4 0.3  PROT 6.6 6.7  ALBUMIN 3.1* 3.1*    CBG: Recent Labs  Lab 05/23/23 1056 05/23/23 1621 05/23/23 2056 05/24/23 0750 05/24/23 1139  GLUCAP 214* 202* 263* 169* 184*     Recent Results (from the past 240 hours)  Aerobic/Anaerobic Culture w Gram Stain (surgical/deep wound)     Status: None  (Preliminary result)   Collection Time: 05/22/23 10:09 AM   Specimen: Abscess  Result Value Ref Range Status   Specimen Description ABSCESS TOE LEFT  Final   Gram Stain   Final    RARE WBC PRESENT, PREDOMINANTLY PMN RARE GRAM POSITIVE COCCI Performed at [facility], [address] [place], [place] [address]    Culture   Final  ABUNDANT METHICILLIN RESISTANT STAPHYLOCOCCUS AUREUS NO ANAEROBES ISOLATED; CULTURE IN PROGRESS FOR 5 DAYS    Report Status PENDING  Incomplete   Organism ID, Bacteria METHICILLIN RESISTANT STAPHYLOCOCCUS AUREUS  Final      Susceptibility   Methicillin resistant staphylococcus aureus - MIC*    CIPROFLOXACIN  >=8 RESISTANT Resistant     ERYTHROMYCIN >=8 RESISTANT Resistant     GENTAMICIN <=0.5 SENSITIVE Sensitive     OXACILLIN >=4 RESISTANT Resistant     TETRACYCLINE <=1 SENSITIVE Sensitive     VANCOMYCIN  1 SENSITIVE Sensitive     TRIMETH/SULFA <=10 SENSITIVE Sensitive     CLINDAMYCIN <=0.25 SENSITIVE Sensitive     RIFAMPIN <=0.5 SENSITIVE Sensitive     Inducible Clindamycin NEGATIVE Sensitive     LINEZOLID 2 SENSITIVE Sensitive     * ABUNDANT METHICILLIN RESISTANT STAPHYLOCOCCUS AUREUS         Radiology Studies: No results found.       Scheduled Meds:  apixaban   5 mg Oral BID   doxycycline   100 mg Oral Q12H   gabapentin   300 mg Oral BID   insulin  aspart  0-15 Units Subcutaneous TID WC   insulin  aspart  0-5 Units Subcutaneous QHS   insulin  glargine-yfgn  10 Units Subcutaneous Daily   lactulose   20 g Oral BID   lidocaine  (PF)  5 mL Intradermal Once   lithium  carbonate  300 mg Oral QHS   lithium   600 mg Oral Daily   lubiprostone   24 mcg Oral BID WC   melatonin  9 mg Oral QHS   methimazole   5 mg Oral BID   QUEtiapine   200 mg Oral QHS   QUEtiapine   50 mg Oral Daily   tamsulosin   0.4 mg Oral Daily   Continuous Infusions:     LOS: 3 days    Time spent: over 30 min    Meliton Monte, MD [facility]   To  contact the attending provider between 7A-7P or the covering provider during after hours 7P-7A, please log into the web site [email or web address] and access using universal Donald password for that web site. If you do not have the password, please call the hospital operator.  05/24/2023, 1:50 PM

## 2023-05-24 NOTE — Discharge Instructions (Addendum)
 Providers Accepting New Patients in [place], [place]    [facility] [address]  [place], [place] [address]J [phone] Accepts most insurances  [facility] [address] [place], [place] [address] [phone] Accepts most insurances  [facility] [address] [place], [place] [address]  [phone] Must meet requirements  [facility] [address] [place], [place] [address] [phone] Accepts most insurances  [facility] [address]  [place], [place] [address] [phone] Accepts most insurances  [facility] [address]   [place], [place]   [phone] Accepts most insurances  [facility])  [phone] [address]  [place], [place] [address] [phone] Accepts most insurances     [facility] [address]  [place], [place] [address] [phone] Accepts most insurances  [facility] [address] [place], [place] [address] [phone] option 1 Accepts Medicaid and [facility] [address]  [place], [place] [address] [phone] Accepts most insurances  Benita Outhouse, MD [address] [place], [place] [address] [phone] Accepts most insurances  [facility] [address]  [place], [place] [address] [phone] Accepts most insurances  [facility] [phone] [address] [place], [place] [address] [phone] Accepts most insurances  [place], [place] [address] [place], [place] [address] [phone]  Accepts most insurances   Wound Care Instructions: -Change dressing daily -Wash foot daily with soap and water -Pat left great toe wound dry -Wrap with Xeroform gauzeto cover open wound and then wrap 4 x 4 around left great toe.   -Wrap foot to ankle with Kerlix

## 2023-05-25 ENCOUNTER — Inpatient Hospital Stay: Payer: Medicare Other | Admitting: Gastroenterology

## 2023-05-27 LAB — AEROBIC/ANAEROBIC CULTURE W GRAM STAIN (SURGICAL/DEEP WOUND)

## 2023-06-28 ENCOUNTER — Emergency Department (HOSPITAL_COMMUNITY): Payer: Medicare Other

## 2023-06-28 ENCOUNTER — Inpatient Hospital Stay (HOSPITAL_COMMUNITY)
Admission: EM | Admit: 2023-06-28 | Discharge: 2023-07-05 | DRG: 393 | Disposition: A | Discharge status: Skilled Nursing Facility | Payer: Medicare Other | Source: Skilled Nursing Facility | Attending: Family Medicine | Admitting: Family Medicine

## 2023-06-28 ENCOUNTER — Encounter (HOSPITAL_COMMUNITY): Payer: Self-pay

## 2023-06-28 ENCOUNTER — Other Ambulatory Visit: Payer: Self-pay

## 2023-06-28 DIAGNOSIS — K219 Gastro-esophageal reflux disease without esophagitis: Secondary | ICD-10-CM | POA: Diagnosis present

## 2023-06-28 DIAGNOSIS — B9620 Unspecified Escherichia coli [E. coli] as the cause of diseases classified elsewhere: Secondary | ICD-10-CM | POA: Diagnosis present

## 2023-06-28 DIAGNOSIS — I82409 Acute embolism and thrombosis of unspecified deep veins of unspecified lower extremity: Secondary | ICD-10-CM | POA: Diagnosis present

## 2023-06-28 DIAGNOSIS — Z1612 Extended spectrum beta lactamase (ESBL) resistance: Secondary | ICD-10-CM | POA: Diagnosis present

## 2023-06-28 DIAGNOSIS — Z7401 Bed confinement status: Secondary | ICD-10-CM

## 2023-06-28 DIAGNOSIS — E66811 Obesity, class 1: Secondary | ICD-10-CM | POA: Diagnosis present

## 2023-06-28 DIAGNOSIS — Z902 Acquired absence of lung [part of]: Secondary | ICD-10-CM

## 2023-06-28 DIAGNOSIS — E876 Hypokalemia: Secondary | ICD-10-CM | POA: Diagnosis present

## 2023-06-28 DIAGNOSIS — K449 Diaphragmatic hernia without obstruction or gangrene: Secondary | ICD-10-CM | POA: Diagnosis present

## 2023-06-28 DIAGNOSIS — N281 Cyst of kidney, acquired: Secondary | ICD-10-CM | POA: Diagnosis present

## 2023-06-28 DIAGNOSIS — Z794 Long term (current) use of insulin: Secondary | ICD-10-CM | POA: Diagnosis not present

## 2023-06-28 DIAGNOSIS — R1110 Vomiting, unspecified: Secondary | ICD-10-CM | POA: Diagnosis not present

## 2023-06-28 DIAGNOSIS — E0590 Thyrotoxicosis, unspecified without thyrotoxic crisis or storm: Secondary | ICD-10-CM | POA: Diagnosis present

## 2023-06-28 DIAGNOSIS — Z86718 Personal history of other venous thrombosis and embolism: Secondary | ICD-10-CM

## 2023-06-28 DIAGNOSIS — Z993 Dependence on wheelchair: Secondary | ICD-10-CM

## 2023-06-28 DIAGNOSIS — Z882 Allergy status to sulfonamides status: Secondary | ICD-10-CM

## 2023-06-28 DIAGNOSIS — Z79899 Other long term (current) drug therapy: Secondary | ICD-10-CM

## 2023-06-28 DIAGNOSIS — T402X5A Adverse effect of other opioids, initial encounter: Secondary | ICD-10-CM | POA: Diagnosis not present

## 2023-06-28 DIAGNOSIS — N390 Urinary tract infection, site not specified: Principal | ICD-10-CM | POA: Diagnosis present

## 2023-06-28 DIAGNOSIS — E039 Hypothyroidism, unspecified: Secondary | ICD-10-CM | POA: Diagnosis present

## 2023-06-28 DIAGNOSIS — G9341 Metabolic encephalopathy: Secondary | ICD-10-CM | POA: Diagnosis present

## 2023-06-28 DIAGNOSIS — F319 Bipolar disorder, unspecified: Secondary | ICD-10-CM | POA: Diagnosis present

## 2023-06-28 DIAGNOSIS — E785 Hyperlipidemia, unspecified: Secondary | ICD-10-CM | POA: Diagnosis present

## 2023-06-28 DIAGNOSIS — C3490 Malignant neoplasm of unspecified part of unspecified bronchus or lung: Secondary | ICD-10-CM | POA: Diagnosis present

## 2023-06-28 DIAGNOSIS — K5903 Drug induced constipation: Secondary | ICD-10-CM | POA: Diagnosis not present

## 2023-06-28 DIAGNOSIS — E1165 Type 2 diabetes mellitus with hyperglycemia: Secondary | ICD-10-CM | POA: Diagnosis present

## 2023-06-28 DIAGNOSIS — K5289 Other specified noninfective gastroenteritis and colitis: Secondary | ICD-10-CM | POA: Diagnosis not present

## 2023-06-28 DIAGNOSIS — K521 Toxic gastroenteritis and colitis: Principal | ICD-10-CM | POA: Diagnosis present

## 2023-06-28 DIAGNOSIS — N139 Obstructive and reflux uropathy, unspecified: Secondary | ICD-10-CM | POA: Diagnosis present

## 2023-06-28 DIAGNOSIS — K5641 Fecal impaction: Secondary | ICD-10-CM | POA: Diagnosis not present

## 2023-06-28 DIAGNOSIS — Z87440 Personal history of urinary (tract) infections: Secondary | ICD-10-CM

## 2023-06-28 DIAGNOSIS — R338 Other retention of urine: Secondary | ICD-10-CM | POA: Diagnosis present

## 2023-06-28 DIAGNOSIS — Z66 Do not resuscitate: Secondary | ICD-10-CM | POA: Diagnosis present

## 2023-06-28 DIAGNOSIS — Z800 Family history of malignant neoplasm of digestive organs: Secondary | ICD-10-CM

## 2023-06-28 DIAGNOSIS — B9629 Other Escherichia coli [E. coli] as the cause of diseases classified elsewhere: Secondary | ICD-10-CM | POA: Diagnosis present

## 2023-06-28 DIAGNOSIS — Z7984 Long term (current) use of oral hypoglycemic drugs: Secondary | ICD-10-CM

## 2023-06-28 DIAGNOSIS — F17210 Nicotine dependence, cigarettes, uncomplicated: Secondary | ICD-10-CM | POA: Diagnosis present

## 2023-06-28 DIAGNOSIS — B964 Proteus (mirabilis) (morganii) as the cause of diseases classified elsewhere: Secondary | ICD-10-CM | POA: Diagnosis present

## 2023-06-28 DIAGNOSIS — I10 Essential (primary) hypertension: Secondary | ICD-10-CM | POA: Diagnosis present

## 2023-06-28 DIAGNOSIS — K0889 Other specified disorders of teeth and supporting structures: Secondary | ICD-10-CM | POA: Diagnosis not present

## 2023-06-28 DIAGNOSIS — Z833 Family history of diabetes mellitus: Secondary | ICD-10-CM

## 2023-06-28 DIAGNOSIS — Z604 Social exclusion and rejection: Secondary | ICD-10-CM | POA: Diagnosis present

## 2023-06-28 DIAGNOSIS — Z6834 Body mass index (BMI) 34.0-34.9, adult: Secondary | ICD-10-CM

## 2023-06-28 DIAGNOSIS — Z79891 Long term (current) use of opiate analgesic: Secondary | ICD-10-CM

## 2023-06-28 DIAGNOSIS — Z803 Family history of malignant neoplasm of breast: Secondary | ICD-10-CM

## 2023-06-28 DIAGNOSIS — Z7901 Long term (current) use of anticoagulants: Secondary | ICD-10-CM

## 2023-06-28 LAB — URINALYSIS, ROUTINE W REFLEX MICROSCOPIC
Bilirubin Urine: NEGATIVE
Glucose, UA: NEGATIVE mg/dL
Hgb urine dipstick: NEGATIVE
Ketones, ur: NEGATIVE mg/dL
Nitrite: POSITIVE — AB
Protein, ur: NEGATIVE mg/dL
Specific Gravity, Urine: 1.006 (ref 1.005–1.030)
pH: 8 (ref 5.0–8.0)

## 2023-06-28 LAB — CBC WITH DIFFERENTIAL/PLATELET
Abs Immature Granulocytes: 0.02 10*3/uL (ref 0.00–0.07)
Basophils Absolute: 0 10*3/uL (ref 0.0–0.1)
Basophils Relative: 0 %
Eosinophils Absolute: 0.2 10*3/uL (ref 0.0–0.5)
Eosinophils Relative: 2 %
HCT: 45.6 % (ref 36.0–46.0)
Hemoglobin: 14.4 g/dL (ref 12.0–15.0)
Immature Granulocytes: 0 %
Lymphocytes Relative: 11 %
Lymphs Abs: 1 10*3/uL (ref 0.7–4.0)
MCH: 29.6 pg (ref 26.0–34.0)
MCHC: 31.6 g/dL (ref 30.0–36.0)
MCV: 93.6 fL (ref 80.0–100.0)
Monocytes Absolute: 0.4 10*3/uL (ref 0.1–1.0)
Monocytes Relative: 5 %
Neutro Abs: 6.9 10*3/uL (ref 1.7–7.7)
Neutrophils Relative %: 82 %
Platelets: 283 10*3/uL (ref 150–400)
RBC: 4.87 MIL/uL (ref 3.87–5.11)
RDW: 12.4 % (ref 11.5–15.5)
WBC: 8.5 10*3/uL (ref 4.0–10.5)
nRBC: 0 % (ref 0.0–0.2)

## 2023-06-28 LAB — COMPREHENSIVE METABOLIC PANEL
ALT: 15 U/L (ref 0–44)
AST: 17 U/L (ref 15–41)
Albumin: 3.6 g/dL (ref 3.5–5.0)
Alkaline Phosphatase: 121 U/L (ref 38–126)
Anion gap: 12 (ref 5–15)
BUN: 8 mg/dL (ref 8–23)
CO2: 32 mmol/L (ref 22–32)
Calcium: 9.8 mg/dL (ref 8.9–10.3)
Chloride: 92 mmol/L — ABNORMAL LOW (ref 98–111)
Creatinine, Ser: 0.51 mg/dL (ref 0.44–1.00)
GFR, Estimated: >60 mL/min (ref >60)
Glucose, Bld: 209 mg/dL — ABNORMAL HIGH (ref 70–99)
Potassium: 2.6 mmol/L — CL (ref 3.5–5.1)
Sodium: 136 mmol/L (ref 135–145)
Total Bilirubin: 0.7 mg/dL (ref 0.0–1.2)
Total Protein: 7.4 g/dL (ref 6.5–8.1)

## 2023-06-28 LAB — CBG MONITORING, ED: Glucose-Capillary: 118 mg/dL — ABNORMAL HIGH (ref 70–99)

## 2023-06-28 LAB — LIPASE, BLOOD: Lipase: 23 U/L (ref 11–51)

## 2023-06-28 LAB — MAGNESIUM: Magnesium: 2.4 mg/dL (ref 1.7–2.4)

## 2023-06-28 LAB — LITHIUM LEVEL: Lithium Lvl: 1.03 mmol/L (ref 0.60–1.20)

## 2023-06-28 MED ORDER — TAMSULOSIN HCL 0.4 MG PO CAPS
0.4000 mg | ORAL_CAPSULE | Freq: Every day | ORAL | Status: DC
Start: 2023-06-28 — End: 2023-07-05
  Administered 2023-06-28 – 2023-07-05 (×8): 0.4 mg via ORAL
  Filled 2023-06-28 (×8): qty 1

## 2023-06-28 MED ORDER — VITAMIN C 500 MG PO TABS
500.0000 mg | ORAL_TABLET | Freq: Two times a day (BID) | ORAL | Status: DC
  Administered 2023-06-28 – 2023-07-05 (×14): 500 mg via ORAL
  Filled 2023-06-28 (×14): qty 1

## 2023-06-28 MED ORDER — SODIUM CHLORIDE 0.9 % IV SOLN
1.0000 g | Freq: Three times a day (TID) | INTRAVENOUS | Status: DC
  Administered 2023-06-28 – 2023-07-05 (×20): 1 g via INTRAVENOUS
  Filled 2023-06-28 (×21): qty 20

## 2023-06-28 MED ORDER — LACTATED RINGERS IV SOLN: INTRAVENOUS | Status: AC

## 2023-06-28 MED ORDER — QUETIAPINE FUMARATE 25 MG PO TABS
200.0000 mg | ORAL_TABLET | Freq: Every day | ORAL | Status: DC
  Administered 2023-06-28: 200 mg via ORAL
  Filled 2023-06-28: qty 2

## 2023-06-28 MED ORDER — POTASSIUM CHLORIDE 10 MEQ/100ML IV SOLN
10.0000 meq | Freq: Once | INTRAVENOUS | Status: AC
  Administered 2023-06-28: 10 meq via INTRAVENOUS
  Filled 2023-06-28: qty 100

## 2023-06-28 MED ORDER — FOLIC ACID 1 MG PO TABS
1.0000 mg | ORAL_TABLET | Freq: Every day | ORAL | Status: DC
  Administered 2023-06-29 – 2023-07-05 (×7): 1 mg via ORAL
  Filled 2023-06-28 (×7): qty 1

## 2023-06-28 MED ORDER — PEG 3350-KCL-NA BICARB-NACL 420 G PO SOLR: 4000.0000 mL | Freq: Once | ORAL | Status: DC

## 2023-06-28 MED ORDER — BASAGLAR KWIKPEN 100 UNIT/ML ~~LOC~~ SOPN: 10.0000 [IU] | PEN_INJECTOR | Freq: Every day | SUBCUTANEOUS | Status: DC

## 2023-06-28 MED ORDER — PIPERACILLIN-TAZOBACTAM 3.375 G IVPB: 3.3750 g | Freq: Three times a day (TID) | INTRAVENOUS | Status: DC

## 2023-06-28 MED ORDER — POTASSIUM CHLORIDE 10 MEQ/100ML IV SOLN
10.0000 meq | INTRAVENOUS | Status: AC
  Administered 2023-06-28 (×5): 10 meq via INTRAVENOUS
  Filled 2023-06-28 (×5): qty 100

## 2023-06-28 MED ORDER — INSULIN ASPART 100 UNIT/ML IJ SOLN: 0.0000 [IU] | Freq: Every day | INTRAMUSCULAR | Status: DC

## 2023-06-28 MED ORDER — LACTULOSE 10 GM/15ML PO SOLN
20.0000 g | Freq: Two times a day (BID) | ORAL | Status: DC
  Administered 2023-06-28 – 2023-06-29 (×2): 20 g via ORAL
  Filled 2023-06-28 (×2): qty 30

## 2023-06-28 MED ORDER — INSULIN ASPART 100 UNIT/ML IJ SOLN
0.0000 [IU] | Freq: Three times a day (TID) | INTRAMUSCULAR | Status: DC
  Administered 2023-06-29: 5 [IU] via SUBCUTANEOUS
  Administered 2023-06-29 (×2): 2 [IU] via SUBCUTANEOUS
  Administered 2023-06-30 (×2): 3 [IU] via SUBCUTANEOUS
  Administered 2023-06-30 – 2023-07-03 (×7): 2 [IU] via SUBCUTANEOUS
  Administered 2023-07-03: 5 [IU] via SUBCUTANEOUS
  Administered 2023-07-03: 3 [IU] via SUBCUTANEOUS
  Administered 2023-07-04 (×2): 2 [IU] via SUBCUTANEOUS
  Administered 2023-07-04 – 2023-07-05 (×3): 3 [IU] via SUBCUTANEOUS

## 2023-06-28 MED ORDER — LITHIUM CARBONATE 150 MG PO CAPS
600.0000 mg | ORAL_CAPSULE | Freq: Every day | ORAL | Status: DC
  Administered 2023-06-29 – 2023-07-05 (×7): 600 mg via ORAL
  Filled 2023-06-28 (×7): qty 4

## 2023-06-28 MED ORDER — PIPERACILLIN-TAZOBACTAM 3.375 G IVPB 30 MIN
3.3750 g | Freq: Once | INTRAVENOUS | Status: AC
  Administered 2023-06-28: 3.375 g via INTRAVENOUS
  Filled 2023-06-28: qty 50

## 2023-06-28 MED ORDER — ALBUTEROL SULFATE (2.5 MG/3ML) 0.083% IN NEBU: 2.5000 mg | INHALATION_SOLUTION | RESPIRATORY_TRACT | Status: DC | PRN

## 2023-06-28 MED ORDER — ACETAMINOPHEN 650 MG RE SUPP: 650.0000 mg | Freq: Four times a day (QID) | RECTAL | Status: DC | PRN

## 2023-06-28 MED ORDER — LUBIPROSTONE 24 MCG PO CAPS
24.0000 ug | ORAL_CAPSULE | Freq: Two times a day (BID) | ORAL | Status: DC
  Administered 2023-06-29 – 2023-07-05 (×13): 24 ug via ORAL
  Filled 2023-06-28 (×19): qty 1

## 2023-06-28 MED ORDER — INSULIN GLARGINE-YFGN 100 UNIT/ML ~~LOC~~ SOLN
10.0000 [IU] | Freq: Every day | SUBCUTANEOUS | Status: DC
  Administered 2023-06-28 – 2023-07-02 (×5): 10 [IU] via SUBCUTANEOUS
  Filled 2023-06-28 (×6): qty 0.1

## 2023-06-28 MED ORDER — APIXABAN 5 MG PO TABS
5.0000 mg | ORAL_TABLET | Freq: Two times a day (BID) | ORAL | Status: DC
  Administered 2023-06-28 – 2023-07-05 (×14): 5 mg via ORAL
  Filled 2023-06-28 (×15): qty 1

## 2023-06-28 MED ORDER — POTASSIUM CHLORIDE CRYS ER 20 MEQ PO TBCR
40.0000 meq | EXTENDED_RELEASE_TABLET | Freq: Once | ORAL | Status: AC
  Administered 2023-06-28: 40 meq via ORAL
  Filled 2023-06-28: qty 2

## 2023-06-28 MED ORDER — QUETIAPINE FUMARATE 25 MG PO TABS
50.0000 mg | ORAL_TABLET | Freq: Every day | ORAL | Status: DC
  Administered 2023-06-29 – 2023-07-05 (×7): 50 mg via ORAL
  Filled 2023-06-28 (×7): qty 2

## 2023-06-28 MED ORDER — MELATONIN 3 MG PO TABS
9.0000 mg | ORAL_TABLET | Freq: Every day | ORAL | Status: DC
  Administered 2023-06-28 – 2023-07-04 (×7): 9 mg via ORAL
  Filled 2023-06-28 (×7): qty 3

## 2023-06-28 MED ORDER — POLYETHYLENE GLYCOL 3350 17 G PO PACK
17.0000 g | PACK | Freq: Once | ORAL | Status: AC
  Administered 2023-06-28: 17 g via ORAL
  Filled 2023-06-28: qty 1

## 2023-06-28 MED ORDER — LITHIUM CARBONATE 150 MG PO CAPS
300.0000 mg | ORAL_CAPSULE | Freq: Every day | ORAL | Status: DC
  Administered 2023-06-28 – 2023-07-04 (×7): 300 mg via ORAL
  Filled 2023-06-28 (×6): qty 2

## 2023-06-28 MED ORDER — IOHEXOL 300 MG/ML  SOLN
100.0000 mL | Freq: Once | INTRAMUSCULAR | Status: AC | PRN
  Administered 2023-06-28: 100 mL via INTRAVENOUS

## 2023-06-28 MED ORDER — CALCIUM CARBONATE ANTACID 500 MG PO CHEW
1.0000 | CHEWABLE_TABLET | ORAL | Status: DC | PRN
  Filled 2023-06-28: qty 1

## 2023-06-28 MED ORDER — ACETAMINOPHEN 325 MG PO TABS
650.0000 mg | ORAL_TABLET | Freq: Four times a day (QID) | ORAL | Status: DC | PRN
  Administered 2023-06-28 – 2023-07-05 (×4): 650 mg via ORAL
  Filled 2023-06-28 (×5): qty 2

## 2023-06-28 MED ORDER — GABAPENTIN 100 MG PO CAPS
200.0000 mg | ORAL_CAPSULE | Freq: Two times a day (BID) | ORAL | Status: DC
  Administered 2023-06-29 – 2023-07-02 (×7): 200 mg via ORAL
  Filled 2023-06-28 (×7): qty 2

## 2023-06-28 MED ORDER — MAGNESIUM OXIDE -MG SUPPLEMENT 400 (240 MG) MG PO TABS
800.0000 mg | ORAL_TABLET | Freq: Once | ORAL | Status: AC
Start: 2023-06-28 — End: 2023-06-28
  Administered 2023-06-28: 800 mg via ORAL
  Filled 2023-06-28: qty 2

## 2023-06-28 MED ORDER — HYDRALAZINE HCL 20 MG/ML IJ SOLN: 5.0000 mg | INTRAMUSCULAR | Status: DC | PRN

## 2023-06-28 MED ORDER — LACTULOSE 10 GM/15ML PO SOLN: 10.0000 g | Freq: Every day | ORAL | Status: DC | PRN

## 2023-06-28 NOTE — H&P (Signed)
TRH H&P   Patient Demographics:    Loghan Subia, is a 68 y.o. female  MRN: [id number]   DOB - 1956/03/23  Admit Date - 06/28/2023  Outpatient Primary MD for the patient is Patient, [name] Per  Referring MD/NP/PA: Dr. Jarold Motto  Patient coming from: From SNF [facility]  Chief Complaint  Patient presents with   Abdominal Pain      HPI:    Itxel Wickard  is a 68 y.o. female, with medical history significant for diabetes mellitus, DVT, on chronic anticoagulation, hypertension, stage II lung cancer, SNF resident for 1 year, wheelchair dependent, bipolar disorder. -Was sent to ED from SNF for abdominal pain, altered mental status, apparently patient had an episode of urinary retention yesterday, for which she required Foley catheter insertion, as well she was noted to be more confused by staff today, complaining of abdominal pain as well, she had significant provide any reliable history as confused, as I discussed with sister, patient with some poor mentation at baseline, but usually she is communicative and overall appropriate. -In ED her UA was positive, known history of ESBL in the past, the abdomen pelvis has been obtained, which did show severe constipation, with sterocolitis, received multiple laxatives in ED, and she had manual disimpaction with significant amount of stool output, potassium significantly low at 2.6, glucose elevated at 209, [facility] hospital[facility] consulted to admit.    Review of systems:    Patient is very poor historian, ROS is unreliable   With Past History of the following :    Past Medical History:  Diagnosis Date   Allergy    Bipolar disorder (HCC)    Depression    Drug abuse (HCC)    history of, went to rehab 2015   GERD (gastroesophageal reflux disease)    Lung cancer (HCC)    lung ca dx 11/11- right upper lobe      Past Surgical History:   Procedure Laterality Date   ABDOMINAL AORTOGRAM W/LOWER EXTREMITY Bilateral 01/10/2022   Procedure: ABDOMINAL AORTOGRAM W/LOWER EXTREMITY;  Surgeon: Nada Libman, MD;  Location: [facility];  Service: Cardiovascular;  Laterality: Bilateral;   ANKLE FRACTURE SURGERY Left    HARDWARE REMOVAL Left 12/13/2021   Procedure: LEFT ANKLE HARDWARE REMOVAL;  Surgeon: Marcene Corning, MD;  Location: [facility];  Service: Orthopedics;  Laterality: Left;   LUNG LOBECTOMY Right 2011   RUL removed for lung cancer   STYLOID PROCESS EXCISION Left 10/16/2014   Procedure: EXCISION LEFT STYLOID PROCESS;  Surgeon: Drema Halon, MD;  Location: [facility];  Service: ENT;  Laterality: Left;   TONSILLECTOMY Bilateral 10/16/2014   Procedure: BILATERAL TONSILLECTOMY;  Surgeon: Drema Halon, MD;  Location: [facility];  Service: ENT;  Laterality: Bilateral;   TUBAL LIGATION  1984      Social History:     Social History  Tobacco Use   Smoking status: Former    Current packs/day: 1.00    Average packs/day: 1 pack/day for 40.0 years (40.0 ttl pk-yrs)    Types: Cigarettes   Smokeless tobacco: Never  Substance Use Topics   Alcohol use: Not Currently    Comment: hx alcoholism.  none since 2017       Family History :     Family History  Problem Relation Age of Onset   Cancer Mother        breast cancer   Cancer Father        stomach cancer   Diabetes Father    Cancer Sister        breast cancer   Diabetes Brother    Diabetes Brother    Cancer Sister        breast cancer     Home Medications:   Prior to Admission medications   Medication Sig Start Date End Date Taking? Authorizing Provider  gabapentin (NEURONTIN) 300 MG capsule Take 300 mg by mouth 3 (three) times daily. 06/10/23  Yes [provider]  gabapentin (NEURONTIN) 400 MG capsule Take 400 mg by mouth 3 (three) times daily. 06/12/23  Yes [provider]  QUEtiapine (SEROQUEL)  200 MG tablet Take 200 mg by mouth at bedtime. 06/19/23  Yes [provider]  QUEtiapine (SEROQUEL) 300 MG tablet Take 300 mg by mouth at bedtime. 06/22/23  Yes [provider]  torsemide (DEMADEX) 20 MG tablet Take 20 mg by mouth 2 (two) times daily. 06/10/23  Yes [provider]  acetaminophen (TYLENOL) 325 MG tablet Take 650 mg by mouth every 4 (four) hours as needed for mild pain.    [provider]  ascorbic acid (VITAMIN C) 500 MG tablet Take 500 mg by mouth 2 (two) times daily.    [provider]  bisacodyl (DULCOLAX) 10 MG suppository Place 10 mg rectally as needed for moderate constipation.    [provider]  calcium carbonate (TUMS - DOSED IN MG ELEMENTAL CALCIUM) 500 MG chewable tablet Chew 1 tablet by mouth every 5 (five) hours as needed for indigestion or heartburn.    [provider]  cetirizine (ZYRTEC) 10 MG tablet Take 10 mg by mouth daily. For allergies    [provider]  diclofenac Sodium (VOLTAREN) 1 % GEL Apply 2 g topically 4 (four) times daily.    [provider]  ELIQUIS 5 MG TABS tablet Take 5 mg by mouth 2 (two) times daily. 01/18/23   [provider]  folic acid (FOLVITE) 1 MG tablet Take 1 mg by mouth daily.    [provider]  insulin aspart (FIASP FLEXTOUCH) 100 UNIT/ML FlexTouch Pen Inject 0-10 Units into the skin 4 (four) times daily -  before meals and at bedtime. Short acting insulin (Aspart) per sliding scale 0-10 ---:-- Insulin injection 0-10 Units 0-10 Units Subcutaneous, 3 times daily with meals CBG 70 - 120: 0 unit CBG 121 - 150: 0 unit  CBG 151 - 200: 1 unit CBG 201 - 250: 2 units CBG 251 - 300: 4 units CBG 301 - 350: 6 units  CBG 351 - 400: 8 units  CBG > 400: 10 units 02/16/23   Emokpae, Courage, MD  Insulin Glargine (BASAGLAR KWIKPEN) 100 UNIT/ML Inject 10 Units into the skin daily. 02/22/23   Osvaldo Shipper, MD  LACTOBACILLUS PO Take 1 capsule by mouth daily.     [provider]  lactulose (CHRONULAC) 10 GM/15ML solution  Take 30 mLs by mouth 2 (two) times daily. 01/18/23   [provider]  lactulose (CHRONULAC) 10 GM/15ML solution Take 10 g by mouth daily as needed for moderate constipation.    [provider]  lidocaine (LIDODERM) 5 % Place 1 patch onto the skin daily. Remove & Discard patch within 12 hours or as directed by MD; Apply to LOWER LEG    [provider]  lithium 600 MG capsule Take 600 mg by mouth daily. 11/24/22   [provider]  lithium carbonate 300 MG capsule Take 300 mg by mouth at bedtime.    [provider]  loperamide (IMODIUM A-D) 2 MG tablet Take 2 mg by mouth every 6 (six) hours as needed for diarrhea or loose stools.    [provider]  lubiprostone (AMITIZA) 24 MCG capsule Take 24 mcg by mouth 2 (two) times daily with a meal.    [provider]  Melatonin 10 MG TABS Take 10 mg by mouth at bedtime.    [provider]  metFORMIN (GLUCOPHAGE) 500 MG tablet Take 1 pill a day Patient taking differently: Take 500 mg by mouth daily with breakfast. Take 1 pill a day 12/17/22   Bethann Berkshire, MD  Multiple Vitamin (MULTIVITAMIN ADULT PO) Take 1 tablet by mouth daily.    [provider]  NOVOLOG FLEXPEN 100 UNIT/ML FlexPen Inject into the skin. 06/27/23   [provider]  ondansetron (ZOFRAN) 4 MG tablet Take 4 mg by mouth every 8 (eight) hours as needed. 12/14/22   [provider]  oxyCODONE-acetaminophen (PERCOCET) 10-325 MG tablet Take 1 tablet by mouth every 6 (six) hours as needed for pain. Patient taking differently: Take 1 tablet by mouth every 4 (four) hours as needed for pain. 05/07/23   Johnson, Clanford L, MD  phenol (CHLORASEPTIC) 1.4 % LIQD Use as directed 5 sprays in the mouth or throat every 2 (two) hours as needed for throat irritation / pain.    [provider]  polyethylene glycol (MIRALAX / GLYCOLAX) 17 g packet  Take 17 g by mouth 2 (two) times daily. 05/07/23   Johnson, Clanford L, MD  tamsulosin (FLOMAX) 0.4 MG CAPS capsule Take 0.4 mg by mouth daily.    [provider]  tiZANidine (ZANAFLEX) 2 MG tablet Take 2 mg by mouth every 4 (four) hours as needed for muscle spasms. 06/12/23   [provider]  triamcinolone (KENALOG) 0.025 % cream Apply 1 Application topically 2 (two) times daily. Apply to R ear external canal for dermatitis for 4 weeks.    [provider]  ARIPiprazole (ABILIFY) 10 MG tablet Take 10 mg by mouth daily.  04/20/23  [provider]     Allergies:     Allergies  Allergen Reactions   Sulfa Antibiotics Swelling and Rash    Neck swelling     Physical Exam:   Vitals  Blood pressure (!) 110/45, pulse (!) 106, temperature 99 F (37.2 C), temperature source Oral, resp. rate 18, height 5\' 9"  (1.753 m), weight 107.3 kg, SpO2 91%.   1. General Frail, elderly female, appears older than stated age, laying in bed, no apparent distress  2.  Wake, alert, oriented x 1, communicative but answering questions inappropriately, able to follow simple commands  3. No F.N deficits, ALL C.Nerves Intact, generalized lower extremity weakness  4. Ears and Eyes appear Normal, Conjunctivae clear, PERRLA. Moist Oral Mucosa.  5. Supple Neck, No JVD, No cervical lymphadenopathy appriciated, No Carotid  Bruits.  6. Symmetrical Chest wall movement, Good air movement bilaterally, CTAB.  7. RRR, No Gallops, Rubs or Murmurs, No Parasternal Heave.  8. Positive Bowel Sounds, abdomen mildly distended, but nontender, no rebound, no guarding   9.  No Cyanosis, Normal Skin Turgor, No Skin Rash or Bruise.  10.  Muscle wasting in lower extremities.    Data Review:    CBC Recent Labs  Lab 06/28/23 1158  WBC 8.5  HGB 14.4  HCT 45.6  PLT 283  MCV 93.6  MCH 29.6  MCHC 31.6  RDW 12.4  LYMPHSABS 1.0  MONOABS 0.4  EOSABS 0.2  BASOSABS 0.0    ------------------------------------------------------------------------------------------------------------------  Chemistries  Recent Labs  Lab 06/28/23 1158  NA 136  K 2.6*  CL 92*  CO2 32  GLUCOSE 209*  BUN 8  CREATININE 0.51  CALCIUM 9.8  MG 2.4  AST 17  ALT 15  ALKPHOS 121  BILITOT 0.7   ------------------------------------------------------------------------------------------------------------------ estimated creatinine clearance is 90.2 mL/min (by C-G formula based on SCr of 0.51 mg/dL). ------------------------------------------------------------------------------------------------------------------ No results for input(s): "TSH", "T4TOTAL", "T3FREE", "THYROIDAB" in the last 72 hours.  Invalid input(s): "FREET3"  Coagulation profile No results for input(s): "INR", "PROTIME" in the last 168 hours. ------------------------------------------------------------------------------------------------------------------- No results for input(s): "DDIMER" in the last 72 hours. -------------------------------------------------------------------------------------------------------------------  Cardiac Enzymes No results for input(s): "CKMB", "TROPONINI", "MYOGLOBIN" in the last 168 hours.  Invalid input(s): "CK" ------------------------------------------------------------------------------------------------------------------    Component Value Date/Time   BNP 20.0 01/24/2023 2015     ---------------------------------------------------------------------------------------------------------------  Urinalysis    Component Value Date/Time   COLORURINE AMBER (A) 06/28/2023 1435   APPEARANCEUR CLEAR 06/28/2023 1435   LABSPEC 1.006 06/28/2023 1435   PHURINE 8.0 06/28/2023 1435   GLUCOSEU NEGATIVE 06/28/2023 1435   HGBUR NEGATIVE 06/28/2023 1435   BILIRUBINUR NEGATIVE 06/28/2023 1435   KETONESUR NEGATIVE 06/28/2023 1435   PROTEINUR NEGATIVE 06/28/2023 1435    UROBILINOGEN 0.2 03/11/2010 1057   NITRITE POSITIVE (A) 06/28/2023 1435   LEUKOCYTESUR MODERATE (A) 06/28/2023 1435    ----------------------------------------------------------------------------------------------------------------   Imaging Results:    CT ABDOMEN PELVIS W CONTRAST Result Date: 06/28/2023 CLINICAL DATA:  Abdominal pain and distention. Concern for bowel obstruction. EXAM: CT ABDOMEN AND PELVIS WITH CONTRAST TECHNIQUE: Multidetector CT imaging of the abdomen and pelvis was performed using the standard protocol following bolus administration of intravenous contrast. RADIATION DOSE REDUCTION: This exam was performed according to the departmental dose-optimization program which includes automated exposure control, adjustment of the mA and/or kV according to patient size and/or use of iterative reconstruction technique. CONTRAST:  OMNIPAQUE IOHEXOL 300 MG/ML  SOLN COMPARISON:  CTA chest, abdomen, and pelvis dated 05/01/2023. FINDINGS: Lower chest: Mild left basilar atelectasis. Hepatobiliary: Hepatomegaly. No focal liver abnormality is seen. No gallstones, gallbladder wall thickening, or biliary dilatation. Pancreas: Unremarkable. No pancreatic ductal dilatation or surrounding inflammatory changes. Spleen: Normal in size without focal abnormality. Adrenals/Urinary Tract: Adrenal glands are unremarkable. Stable longstanding 2.9 cm hyperattenuating lesion along the medial aspect of the left kidney, which requires no specific follow-up imaging. Stable nonobstructive punctate left renal calculus. The right kidney is unremarkable. No hydronephrosis. Bladder is decompressed with Foley catheter in place. Stomach/Bowel: Small hiatal hernia, unchanged. Small bowel is grossly unremarkable. No evidence of obstruction. Large volume of stool throughout the colon, most pronounced in the rectum, which is dilated with a large stool ball measuring up to 9.8 cm in diameter. There is mild circumferential  rectal wall thickening and stranding. Vascular/Lymphatic: The abdominal aorta is normal in  caliber with atherosclerotic calcification. No enlarged abdominal or pelvic lymph nodes. Reproductive: Atrophic uterus.  No adnexal masses. Other: No abdominopelvic ascites. No intraperitoneal free air. No abdominal wall hernia. Musculoskeletal: No acute osseous abnormality. No suspicious osseous lesion. 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 IMPRESSION: 1. Large volume of stool throughout the colon. The rectum is dilated with a large stool ball measuring up to 9.8 cm in diameter with mild circumferential wall thickening and stranding, concerning for stercoral colitis. 2. No evidence of obstruction. 3. Additional unchanged chronic findings, as described above. Aortic Atherosclerosis (ICD10-I70.0). Electronically Signed   By: Hart Robinsons M.D.   On: 06/28/2023 16:04    My personal review of EKG: Recurrent for artifact, rhythm appears to be regular, but undetermined, QTc of 422   Assessment & Plan:    Principal Problem:   Stercoral colitis Active Problems:   Acute urinary retention   Controlled type 2 diabetes mellitus with hyperglycemia (HCC)   DVT (deep venous thrombosis) 09/08/22   UTI due to extended-spectrum beta lactamase (ESBL) producing Escherichia coli   HTN (hypertension)  Abdominal pain -Much improved after BM, this is most likely in the setting of sterile coral colitis/C constipation, and UTI.  UTI Urinary retention -Catheter inserted in SNF yesterday, continue with Flomax Follow on urine cultures, meanwhile continue with meropenem in the setting of recent ESBL UTI  Stercoral colitis -From severe constipation, GI consulted, had manual disimpaction in ED with large volume output, continue with GoLytely x 4 L per GI recommendation -Continue with meropenem  Acute metabolic encephalopathy -Is most likely in the setting of her UTI, ESBL and multiple electrolyte derangements -She has  no focal deficits -She is on multiple psych medications, will continue for now, lithium within normal limit -I will decrease her gabapentin dose over next 24 hours then resume home dose when mentation back to baseline  Hypothyroidism -Does appear her methimazole was discontinued during recent admission for elevated TSH, will recheck Sh and free T4  Diabetes mellitus 2, insulin-dependent, poorly controlled with hyperglycemia -most recent A1c 10.9 last admission, hold metformin, continue home basal insulin and insulin sliding scale  Hypertension -Stable, not on any home medications  DVT-in 4/26/224 -Continue with Eliquis   MDD (major depressive disorder), recurrent severe, without psychosis (HCC) - Continue with home lithium, level within normal limit -Continue with Seroquel, but will increase evening dose from 300-200, resume to home dose once mentation back to baseline -Will resume on lower dose gabapentin, increase to home dose once mentation back to baseline   Obesity, class I Body mass index is 34.93 kg/m.    DVT Prophylaxis on Eliquis  AM Labs Ordered, also please review Full Orders  Family Communication: Admission, patients condition and plan of care including tests being ordered have been discussed with the patient and Hyster by phone who indicate understanding and agree with the plan and Code Status.  Code Status DNR, confirmed by sister, she has MOST form  Likely DC to back to SNF  Consults called: GI  Admission status: Inpatient  Time spent in minutes : 75 minutes   Huey Bienenstock M.D on 06/28/2023 at 4:56 PM   [facility] - Office  [phone]

## 2023-06-28 NOTE — Progress Notes (Addendum)
Pharmacy Antibiotic Note  [name] is a 68 y.o. female admitted on 06/28/2023 with  intra-abdominal infection .  Pharmacy has been consulted for merrem dosing. Patient with recent h/o ESBL E.Coli in Blood and URine in 12/24. Changing empiric tx to merrem  Plan: Merrem 1gm IV q8h F/U cxs and clinical progress Monitor V/S, labs  Height: 5\' 9"  (175.3 cm) Weight: 107.3 kg (236 lb 8.9 oz) IBW/kg (Calculated) : 66.2  Temp (24hrs), Avg:99 F (37.2 C), Min:99 F (37.2 C), Max:99 F (37.2 C)  Recent Labs  Lab 06/28/23 1158  WBC 8.5  CREATININE 0.51    Estimated Creatinine Clearance: 90.2 mL/min (by C-G formula based on SCr of 0.51 mg/dL).    Allergies  Allergen Reactions   Sulfa Antibiotics Swelling and Rash    Neck swelling    Antimicrobials this admission: zosyn 2/13 x 1 dose Merrem 2/13 >>   Thank you for allowing pharmacy to be a part of this patient's care.  Elder Cyphers, BS Pharm D, BCPS Clinical Pharmacist 06/28/2023 4:51 PM

## 2023-06-28 NOTE — ED Triage Notes (Signed)
Pt bib [facility] from [facility]. EMS reports that facility states pt has had ABD pain since yesterday evening, that facility catheterized (in/out) pt yesterday and got 500 cc returned. Facility also states that she has ABD distention. Pt does have a MOST form.

## 2023-06-28 NOTE — ED Provider Notes (Signed)
  Physical Exam  BP 112/74   Pulse 85   Temp 99 F (37.2 C) (Oral)   Resp 18   Ht 5\' 9"  (1.753 m)   Wt 107.3 kg   SpO2 91%   BMI 34.93 kg/m   Physical Exam  Procedures  Procedures  ED Course / MDM   Clinical Course as of 06/28/23 1710  Thu Jun 28, 2023  1503 Assumed care from Dr Posey Rea. 68 yo F with hx of ESBL uti requiring admissions. Having lower abdominal pain and uti. Getting an enema for constipation. Also has K of 2.6.  [RP]  1539 Urinalysis does appear to be consistent with possible UTI.  I have reviewed her blood cultures and urine cultures and she has grown ESBL but has been susceptible to Macrobid and Bactrim in the past.  Blood cultures were also susceptible to Bactrim.  Unfortunately patient does have an allergy to sulfa antibiotics. [RP]  1610 CT scan shows sterile coral colitis.  Will start the patient on Zosyn which her prior ESBL UTIs have been susceptible to as well.  Patient did have a bowel movement after her enema.  Will start her on MiraLAX as well and admitted to hospitalist for her UTI, stercoral colitis, and hypokalemia. [RP]  1632 Dr Randol Kern from hospitalist to admit.  [RP]    Clinical Course User Index [RP] Rondel Baton, MD   Medical Decision Making Amount and/or Complexity of Data Reviewed Labs: ordered. Radiology: ordered.  Risk OTC drugs. Prescription drug management. Decision regarding hospitalization.      Rondel Baton, MD 06/28/23 1710

## 2023-06-28 NOTE — ED Notes (Signed)
Family updated as to patient's status.

## 2023-06-28 NOTE — ED Provider Notes (Signed)
[place] [facility]ERGENC[facility] DEPARTMENT AT [facility] Provider Note  CSN: [id number] Arrival date & time: 06/28/23 1112  Chief Complaint(s) Abdominal Pain  HPI [name] is a 68 y.o. female with PMH T2DM, DVT on chronic anticoagulation, lung cancer, HTN, ESBL UTIs with frequent hospital admissions who presents emergency department for evaluation of abdominal pain and altered mental status.  Patient transferred from [facility] due to concern for intermittent altered mental status, lower abdominal pain and abdominal distention.  States that symptoms have been present for at least 24 hours.  Patient currently unable to provide further history as she is altered.   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 coli) (HCC) 05/04/2023   E coli bacteremia 05/04/2023   Cystitis with hematuria 05/04/2023   Sepsis due to urinary tract infection (HCC) 05/01/2023   UTI due to extended-spectrum beta lactamase (ESBL) producing Escherichia coli 02/18/2023   HTN (hypertension) 02/18/2023   Septic shock (HCC) 02/14/2023   Sepsis (HCC) 02/14/2023   Controlled type 2 diabetes mellitus with hyperglycemia (HCC) 02/14/2023   DVT (deep venous thrombosis) 09/08/22 09/08/2022   Acute left ankle pain 12/13/2021   UTI (urinary tract infection) 08/03/2021   Hypernatremia 07/30/2021   Acute urinary retention 07/30/2021   Hypokalemia 07/30/2021   Hypotension 07/29/2021   Ankle wound, left 07/29/2021   Severe sepsis (HCC) 07/28/2021   Dyslipidemia  07/28/2021   Acute metabolic encephalopathy 07/28/2021   MDD (major depressive disorder), recurrent severe, 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 Authorizing Provider  gabapentin (NEURONTIN) 300 MG capsule Take 300 mg by mouth 3 (three) times daily. 06/10/23  Yes [provider]  gabapentin (NEURONTIN) 400 MG capsule Take 400 mg by mouth 3 (three) times daily. 06/12/23  Yes [provider]  QUEtiapine (SEROQUEL) 200 MG tablet Take 200 mg by mouth at bedtime. 06/19/23  Yes [provider]  QUEtiapine (SEROQUEL) 300 MG tablet Take 300 mg by mouth at bedtime. 06/22/23  Yes [provider]  torsemide (DEMADEX) 20 MG tablet Take 20 mg by mouth 2 (two) times daily. 06/10/23  Yes [provider]  acetaminophen (TYLENOL) 325 MG tablet Take 650 mg by mouth every 4 (four) hours as needed for mild pain.    [provider]  ascorbic acid (VITAMIN C) 500 MG tablet Take 500 mg by mouth 2 (two) times daily.    [provider]  bisacodyl (DULCOLAX) 10 MG suppository Place 10 mg rectally as needed for moderate constipation.    [provider]  calcium carbonate (TUMS - DOSED IN MG ELEMENTAL CALCIUM) 500 MG chewable tablet Chew 1 tablet by mouth every 5 (five) hours as needed for  indigestion or heartburn.    [provider]  cetirizine (ZYRTEC) 10 MG tablet Take 10 mg by mouth daily. For allergies    [provider]  diclofenac Sodium (VOLTAREN) 1 % GEL Apply 2 g topically 4 (four) times daily.    [provider]  ELIQUIS 5 MG TABS tablet Take 5 mg by mouth 2 (two) times daily. 01/18/23   [provider]  folic acid (FOLVITE) 1 MG tablet Take 1  mg by mouth daily.    [provider]  insulin aspart (FIASP FLEXTOUCH) 100 UNIT/ML FlexTouch Pen Inject 0-10 Units into the skin 4 (four) times daily -  before meals and at bedtime. Short acting insulin (Aspart) per sliding scale 0-10 ---:-- Insulin injection 0-10 Units 0-10 Units Subcutaneous, 3 times daily with meals CBG 70 - 120: 0 unit CBG 121 - 150: 0 unit  CBG 151 - 200: 1 unit CBG 201 - 250: 2 units CBG 251 - 300: 4 units CBG 301 - 350: 6 units  CBG 351 - 400: 8 units  CBG > 400: 10 units 02/16/23   Emokpae, Courage, MD  Insulin Glargine (BASAGLAR KWIKPEN) 100 UNIT/ML Inject 10 Units into the skin daily. 02/22/23   Osvaldo Shipper, MD  LACTOBACILLUS PO Take 1 capsule by mouth daily.    [provider]  lactulose (CHRONULAC) 10 GM/15ML solution Take 30 mLs by mouth 2 (two) times daily. 01/18/23   [provider]  lactulose (CHRONULAC) 10 GM/15ML solution Take 10 g by mouth daily as needed for moderate constipation.    [provider]  lidocaine (LIDODERM) 5 % Place 1 patch onto the skin daily. Remove & Discard patch within 12 hours or as directed by MD; Apply to LOWER LEG    [provider]  lithium 600 MG capsule Take 600 mg by mouth daily. 11/24/22   [provider]  lithium carbonate 300 MG capsule Take 300 mg by mouth at bedtime.    [provider]  loperamide (IMODIUM A-D) 2 MG tablet Take 2 mg by mouth every 6 (six) hours as needed for diarrhea or loose stools.    [provider]  lubiprostone (AMITIZA) 24 MCG capsule Take 24 mcg by mouth 2 (two) times daily with a meal.    [provider]  Melatonin 10 MG TABS Take 10 mg by mouth at bedtime.    [provider]  metFORMIN (GLUCOPHAGE) 500 MG tablet Take 1 pill a day Patient taking differently: Take 500 mg by mouth daily with breakfast. Take 1 pill a day 12/17/22   Bethann Berkshire, MD  Multiple Vitamin (MULTIVITAMIN ADULT PO) Take 1 tablet by mouth daily.     [provider]  NOVOLOG FLEXPEN 100 UNIT/ML FlexPen Inject into the skin. 06/27/23   [provider]  ondansetron (ZOFRAN) 4 MG tablet Take 4 mg by mouth every 8 (eight) hours as needed. 12/14/22   [provider]  oxyCODONE-acetaminophen (PERCOCET) 10-325 MG tablet Take 1 tablet by mouth every 6 (six) hours as needed for pain. Patient taking differently: Take 1 tablet by mouth every 4 (four) hours as needed for pain. 05/07/23   Johnson, Clanford L, MD  phenol (CHLORASEPTIC) 1.4 % LIQD Use as directed 5 sprays in the mouth or throat every 2 (two) hours as needed for throat irritation / pain.    [provider]  polyethylene glycol (MIRALAX / GLYCOLAX) 17 g packet Take 17 g by mouth 2 (two) times daily. 05/07/23  Johnson, Clanford L, MD  tamsulosin (FLOMAX) 0.4 MG CAPS capsule Take 0.4 mg by mouth daily.    [provider]  tiZANidine (ZANAFLEX) 2 MG tablet Take 2 mg by mouth every 4 (four) hours as needed for muscle spasms. 06/12/23   [provider]  triamcinolone (KENALOG) 0.025 % cream Apply 1 Application topically 2 (two) times daily. Apply to R ear external canal for dermatitis for 4 weeks.    [provider]  ARIPiprazole (ABILIFY) 10 MG tablet Take 10 mg by mouth daily.  04/20/23  [provider]                                                                                                                                    Past Surgical History Past Surgical History:  Procedure Laterality Date   ABDOMINAL AORTOGRAM W/LOWER EXTREMITY Bilateral 01/10/2022   Procedure: ABDOMINAL AORTOGRAM W/LOWER EXTREMITY;  Surgeon: Nada Libman, MD;  Location: [facility];  Service: Cardiovascular;  Laterality: Bilateral;   ANKLE FRACTURE SURGERY Left    HARDWARE REMOVAL Left 12/13/2021   Procedure: LEFT ANKLE HARDWARE REMOVAL;  Surgeon: Marcene Corning, MD;  Location: [facility];  Service: Orthopedics;  Laterality: Left;   LUNG  LOBECTOMY Right 2011   RUL removed for lung cancer   STYLOID PROCESS EXCISION Left 10/16/2014   Procedure: EXCISION LEFT STYLOID PROCESS;  Surgeon: Drema Halon, MD;  Location: [facility];  Service: ENT;  Laterality: Left;   TONSILLECTOMY Bilateral 10/16/2014   Procedure: BILATERAL TONSILLECTOMY;  Surgeon: Drema Halon, MD;  Location:  SURGERY CENTER;  Service: ENT;  Laterality: Bilateral;   TUBAL LIGATION  1984   Family History Family History  Problem Relation Age of Onset   Cancer Mother        breast cancer   Cancer Father        stomach cancer   Diabetes Father    Cancer Sister        breast cancer   Diabetes Brother    Diabetes Brother    Cancer Sister        breast cancer    Social History Social History   Tobacco Use   Smoking status: Former    Current packs/day: 1.00    Average packs/day: 1 pack/day for 40.0 years (40.0 ttl pk-yrs)    Types: Cigarettes   Smokeless tobacco: Never  Vaping Use   Vaping status: Former  Substance Use Topics   Alcohol use: Not Currently    Comment: hx alcoholism.  none since 2017   Drug use: Not Currently    Types: Benzodiazepines    Comment: hx of xanax abuse. last use 2015   Allergies Sulfa antibiotics  Review of Systems Review of Systems  Unable to perform ROS: Mental status change    Physical Exam Vital Signs  I have reviewed the triage vital signs BP (!) 110/45  Pulse (!) 106   Temp 99 F (37.2 C) (Oral)   Resp 18   Ht 5\' 9"  (1.753 m)   Wt 107.3 kg   SpO2 91%   BMI 34.93 kg/m   Physical Exam Vitals and nursing note reviewed.  Constitutional:      General: She is not in acute distress.    Appearance: She is well-developed.  HENT:     Head: Normocephalic and atraumatic.  Eyes:     Conjunctiva/sclera: Conjunctivae normal.  Cardiovascular:     Rate and Rhythm: Normal rate and regular rhythm.     Heart sounds: No murmur heard. Pulmonary:     Effort: Pulmonary effort  is normal. No respiratory distress.     Breath sounds: Normal breath sounds.  Abdominal:     General: There is distension.     Palpations: Abdomen is soft.     Tenderness: There is no abdominal tenderness.  Musculoskeletal:        General: No swelling.     Cervical back: Neck supple.  Skin:    General: Skin is warm and dry.     Capillary Refill: Capillary refill takes less than 2 seconds.  Neurological:     Mental Status: She is alert. She is disoriented.  Psychiatric:        Mood and Affect: Mood normal.     ED Results and Treatments Labs (all labs ordered are listed, but only abnormal results are displayed) Labs Reviewed  COMPREHENSIVE METABOLIC PANEL - Abnormal; Notable for the following components:      Result Value   Potassium 2.6 (*)    Chloride 92 (*)    Glucose, Bld 209 (*)    All other components within normal limits  URINALYSIS, ROUTINE W REFLEX MICROSCOPIC - Abnormal; Notable for the following components:   Color, Urine AMBER (*)    Nitrite POSITIVE (*)    Leukocytes,Ua MODERATE (*)    Bacteria, UA RARE (*)    All other components within normal limits  CULTURE, BLOOD (ROUTINE X 2)  CULTURE, BLOOD (ROUTINE X 2)  URINE CULTURE  CBC WITH DIFFERENTIAL/PLATELET  LIPASE, BLOOD  MAGNESIUM  LITHIUM LEVEL                                                                                                                          Radiology CT ABDOMEN PELVIS W CONTRAST Result Date: 06/28/2023 CLINICAL DATA:  Abdominal pain and distention. Concern for bowel obstruction. EXAM: CT ABDOMEN AND PELVIS WITH CONTRAST TECHNIQUE: Multidetector CT imaging of the abdomen and pelvis was performed using the standard protocol following bolus administration of intravenous contrast. RADIATION DOSE REDUCTION: This exam was performed according to the departmental dose-optimization program which includes automated exposure control, adjustment of the mA and/or kV according to patient size and/or  use of iterative reconstruction technique. CONTRAST:  OMNIPAQUE IOHEXOL 300 MG/ML  SOLN COMPARISON:  CTA chest, abdomen, and pelvis dated 05/01/2023. FINDINGS: Lower chest: Mild left basilar  atelectasis. Hepatobiliary: Hepatomegaly. No focal liver abnormality is seen. No gallstones, gallbladder wall thickening, or biliary dilatation. Pancreas: Unremarkable. No pancreatic ductal dilatation or surrounding inflammatory changes. Spleen: Normal in size without focal abnormality. Adrenals/Urinary Tract: Adrenal glands are unremarkable. Stable longstanding 2.9 cm hyperattenuating lesion along the medial aspect of the left kidney, which requires no specific follow-up imaging. Stable nonobstructive punctate left renal calculus. The right kidney is unremarkable. No hydronephrosis. Bladder is decompressed with Foley catheter in place. Stomach/Bowel: Small hiatal hernia, unchanged. Small bowel is grossly unremarkable. No evidence of obstruction. Large volume of stool throughout the colon, most pronounced in the rectum, which is dilated with a large stool ball measuring up to 9.8 cm in diameter. There is mild circumferential rectal wall thickening and stranding. Vascular/Lymphatic: The abdominal aorta is normal in caliber with atherosclerotic calcification. No enlarged abdominal or pelvic lymph nodes. Reproductive: Atrophic uterus.  No adnexal masses. Other: No abdominopelvic ascites. No intraperitoneal free air. No abdominal wall hernia. Musculoskeletal: No acute osseous abnormality. No suspicious osseous lesion. 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 IMPRESSION: 1. Large volume of stool throughout the colon. The rectum is dilated with a large stool ball measuring up to 9.8 cm in diameter with mild circumferential wall thickening and stranding, concerning for stercoral colitis. 2. No evidence of obstruction. 3. Additional unchanged chronic findings, as described above. Aortic Atherosclerosis (ICD10-I70.0).  Electronically Signed   By: Hart Robinsons M.D.   On: 06/28/2023 16:04    Pertinent labs & imaging results that were available during my care of the patient were reviewed by me and considered in my medical decision making (see MDM for details).  Medications Ordered in ED Medications  potassium chloride 10 mEq in 100 mL IVPB (10 mEq Intravenous New Bag/Given 06/28/23 1651)  meropenem (MERREM) 1 g in sodium chloride 0.9 % 100 mL IVPB (has no administration in time range)  polyethylene glycol-electrolytes (NuLYTELY) solution 4,000 mL (has no administration in time range)  potassium chloride SA (KLOR-CON M) CR tablet 40 mEq (40 mEq Oral Given 06/28/23 1437)  magnesium oxide (MAG-OX) tablet 800 mg (800 mg Oral Given 06/28/23 1437)  potassium chloride 10 mEq in 100 mL IVPB (0 mEq Intravenous Stopped 06/28/23 1638)  iohexol (OMNIPAQUE) 300 MG/ML solution 100 mL (100 mLs Intravenous Contrast Given 06/28/23 1402)  potassium chloride SA (KLOR-CON M) CR tablet 40 mEq (40 mEq Oral Given 06/28/23 1534)  polyethylene glycol (MIRALAX / GLYCOLAX) packet 17 g (17 g Oral Given 06/28/23 1653)  piperacillin-tazobactam (ZOSYN) IVPB 3.375 g (0 g Intravenous Stopped 06/28/23 1723)                                                                                                                                     Procedures .Critical Care  Performed by: Glendora Score, MD Authorized by: Glendora Score, MD   Critical care provider statement:    Critical care time (minutes):  30   Critical care was necessary to treat or prevent imminent or life-threatening deterioration of the following conditions:  Metabolic crisis   Critical care was time spent personally by me on the following activities:  Development of treatment plan with patient or surrogate, discussions with consultants, evaluation of patient's response to treatment, examination of patient, ordering and review of laboratory studies, ordering and review of  radiographic studies, ordering and performing treatments and interventions, pulse oximetry, re-evaluation of patient's condition and review of old charts   (including critical care time)  Medical Decision Making / ED Course   This patient presents to the ED for concern of abdominal pain, altered mental status, this involves an extensive number of treatment options, and is a complaint that carries with it a high risk of complications and morbidity.  The differential diagnosis includes infection, metabolic/toxic encephalopathy, hypoglycemia, malperfusion, hypoxia, trauma or other intracranial process  MDM: Patient seen emergency room for evaluation of altered mental status and abdominal pain.  Physical exam with abdominal distention but is otherwise unremarkable.  Laboratory evaluation with significant hypokalemia to 2.6 and patient orally and IV repleted.  Pending urinalysis and CT abdomen pelvis at time of signout.  Please see provider signout for continuation of workup.   Additional history obtained:  -External records from outside source obtained and reviewed including: Chart review including previous notes, labs, imaging, consultation notes   Lab Tests: -I ordered, reviewed, and interpreted labs.   The pertinent results include:   Labs Reviewed  COMPREHENSIVE METABOLIC PANEL - Abnormal; Notable for the following components:      Result Value   Potassium 2.6 (*)    Chloride 92 (*)    Glucose, Bld 209 (*)    All other components within normal limits  URINALYSIS, ROUTINE W REFLEX MICROSCOPIC - Abnormal; Notable for the following components:   Color, Urine AMBER (*)    Nitrite POSITIVE (*)    Leukocytes,Ua MODERATE (*)    Bacteria, UA RARE (*)    All other components within normal limits  CULTURE, BLOOD (ROUTINE X 2)  CULTURE, BLOOD (ROUTINE X 2)  URINE CULTURE  CBC WITH DIFFERENTIAL/PLATELET  LIPASE, BLOOD  MAGNESIUM  LITHIUM LEVEL      EKG   EKG  Interpretation Date/Time:  Thursday June 28 2023 17:31:53 EST Ventricular Rate:  103 PR Interval:    QRS Duration:  140 QT Interval:  322 QTC Calculation: 422 R Axis:   36  Text Interpretation: Interpretation limited secondary to artifact Artifact in lead(s) I II III aVR aVL V2 Confirmed by Baylin Cabal ([id number]) on 06/28/2023 5:58:13 PM         Imaging Studies ordered: I ordered imaging studies including CTAP and this is pending   Medicines ordered and prescription drug management: Meds ordered this encounter  Medications   potassium chloride SA (KLOR-CON M) CR tablet 40 mEq   magnesium oxide (MAG-OX) tablet 800 mg   potassium chloride 10 mEq in 100 mL IVPB   iohexol (OMNIPAQUE) 300 MG/ML solution 100 mL   potassium chloride SA (KLOR-CON M) CR tablet 40 mEq   polyethylene glycol (MIRALAX / GLYCOLAX) packet 17 g   piperacillin-tazobactam (ZOSYN) IVPB 3.375 g    Antibiotic Indication::   Intra-abdominal Infection   potassium chloride 10 mEq in 100 mL IVPB   DISCONTD: piperacillin-tazobactam (ZOSYN) IVPB 3.375 g    Antibiotic Indication::   Intra-abdominal Infection   meropenem (MERREM) 1 g in sodium chloride 0.9 % 100 mL IVPB  Antibiotic Indication::   ESBL Infection   polyethylene glycol-electrolytes (NuLYTELY) solution 4,000 mL    -I have reviewed the patients home medicines and have made adjustments as needed  Critical interventions none    Cardiac Monitoring: The patient was maintained on a cardiac monitor.  I personally viewed and interpreted the cardiac monitored which showed an underlying rhythm of: A-fib  Social Determinants of Health:  Factors impacting patients care include: Lives in a skilled nursing facility   Reevaluation: After the interventions noted above, I reevaluated the patient and found that they have :stayed the same  Co morbidities that complicate the patient evaluation  Past Medical History:  Diagnosis Date   Allergy    Bipolar  disorder (HCC)    Depression    Drug abuse (HCC)    history of, went to rehab 2015   GERD (gastroesophageal reflux disease)    Lung cancer (HCC)    lung ca dx 11/11- right upper lobe      Dispostion: I considered admission for this patient, and disposition pending completion of imaging studies and urinalysis.  Please see provider signout note for continuation of workup     Final Clinical Impression(s) / ED Diagnoses Final diagnoses:  Urinary tract infection without hematuria, site unspecified     @PCDICTATION @    Glendora Score, MD 06/28/23 1758

## 2023-06-28 NOTE — ED Provider Notes (Addendum)
Dr Tasia Catchings from gastroenterology recommending disempaction and 4L go [place] tonight.  Patient was disimpacted.  Chaperoned by Barista.   .Fecal disimpaction  Date/Time: 06/28/2023 8:34 PM  Performed by: Rondel Baton, MD Authorized by: Rondel Baton, MD  Consent: Verbal consent obtained. Consent given by: patient Patient understanding: patient states understanding of the procedure being performed Patient consent: the patient's understanding of the procedure matches consent given Relevant documents: relevant documents present and verified Test results: test results available and properly labeled Preparation: Patient was prepped and draped in the usual sterile fashion. Local anesthesia used: no  Anesthesia: Local anesthesia used: no  Sedation: Patient sedated: no  Patient tolerance: patient tolerated the procedure well with no immediate complications        Rondel Baton, MD 06/28/23 2035

## 2023-06-29 DIAGNOSIS — K5641 Fecal impaction: Secondary | ICD-10-CM | POA: Diagnosis not present

## 2023-06-29 DIAGNOSIS — K5289 Other specified noninfective gastroenteritis and colitis: Secondary | ICD-10-CM | POA: Diagnosis not present

## 2023-06-29 LAB — CBC
HCT: 44.7 % (ref 36.0–46.0)
Hemoglobin: 14.1 g/dL (ref 12.0–15.0)
MCH: 30.5 pg (ref 26.0–34.0)
MCHC: 31.5 g/dL (ref 30.0–36.0)
MCV: 96.5 fL (ref 80.0–100.0)
Platelets: 276 10*3/uL (ref 150–400)
RBC: 4.63 MIL/uL (ref 3.87–5.11)
RDW: 12.7 % (ref 11.5–15.5)
WBC: 6.3 10*3/uL (ref 4.0–10.5)
nRBC: 0 % (ref 0.0–0.2)

## 2023-06-29 LAB — T4, FREE: Free T4: 1.03 ng/dL (ref 0.61–1.12)

## 2023-06-29 LAB — BASIC METABOLIC PANEL
Anion gap: 9 (ref 5–15)
BUN: 6 mg/dL — ABNORMAL LOW (ref 8–23)
CO2: 25 mmol/L (ref 22–32)
Calcium: 9.1 mg/dL (ref 8.9–10.3)
Chloride: 108 mmol/L (ref 98–111)
Creatinine, Ser: 0.4 mg/dL — ABNORMAL LOW (ref 0.44–1.00)
GFR, Estimated: >60 mL/min (ref >60)
Glucose, Bld: 152 mg/dL — ABNORMAL HIGH (ref 70–99)
Potassium: 3.6 mmol/L (ref 3.5–5.1)
Sodium: 142 mmol/L (ref 135–145)

## 2023-06-29 LAB — TSH: TSH: 0.73 u[IU]/mL (ref 0.350–4.500)

## 2023-06-29 LAB — GLUCOSE, CAPILLARY
Glucose-Capillary: 141 mg/dL — ABNORMAL HIGH (ref 70–99)
Glucose-Capillary: 141 mg/dL — ABNORMAL HIGH (ref 70–99)
Glucose-Capillary: 211 mg/dL — ABNORMAL HIGH (ref 70–99)
Glucose-Capillary: 172 mg/dL — ABNORMAL HIGH (ref 70–99)

## 2023-06-29 MED ORDER — LACTULOSE 10 GM/15ML PO SOLN
20.0000 g | Freq: Three times a day (TID) | ORAL | Status: DC
  Administered 2023-06-29 – 2023-07-05 (×18): 20 g via ORAL
  Filled 2023-06-29 (×19): qty 30

## 2023-06-29 MED ORDER — CHLORHEXIDINE GLUCONATE CLOTH 2 % EX PADS
6.0000 | MEDICATED_PAD | Freq: Every day | CUTANEOUS | Status: DC
  Administered 2023-06-29 – 2023-07-02 (×4): 6 via TOPICAL

## 2023-06-29 MED ORDER — NALOXEGOL OXALATE 25 MG PO TABS
25.0000 mg | ORAL_TABLET | Freq: Every day | ORAL | Status: DC
  Administered 2023-06-29 – 2023-07-05 (×7): 25 mg via ORAL
  Filled 2023-06-29 (×8): qty 1

## 2023-06-29 MED ORDER — OXYCODONE-ACETAMINOPHEN 5-325 MG PO TABS
1.0000 | ORAL_TABLET | Freq: Four times a day (QID) | ORAL | Status: DC | PRN
  Administered 2023-06-29 – 2023-07-02 (×4): 1 via ORAL
  Filled 2023-06-29 (×5): qty 1

## 2023-06-29 MED ORDER — POTASSIUM CHLORIDE 10 MEQ/100ML IV SOLN
INTRAVENOUS | Status: AC
  Administered 2023-06-29: 10 meq via INTRAVENOUS
  Filled 2023-06-29: qty 100

## 2023-06-29 MED ORDER — OXYCODONE HCL 5 MG PO TABS
5.0000 mg | ORAL_TABLET | Freq: Four times a day (QID) | ORAL | Status: DC | PRN
  Administered 2023-06-29 – 2023-07-02 (×4): 5 mg via ORAL
  Filled 2023-06-29 (×4): qty 1

## 2023-06-29 MED ORDER — QUETIAPINE FUMARATE 100 MG PO TABS
300.0000 mg | ORAL_TABLET | Freq: Every day | ORAL | Status: DC
  Administered 2023-06-29 – 2023-07-04 (×6): 300 mg via ORAL
  Filled 2023-06-29 (×7): qty 3

## 2023-06-29 MED ORDER — OXYCODONE-ACETAMINOPHEN 10-325 MG PO TABS: 1.0000 | ORAL_TABLET | Freq: Four times a day (QID) | ORAL | Status: DC | PRN

## 2023-06-29 NOTE — Progress Notes (Addendum)
Progress Note   Patient: [name]:[id number] DOB: Apr 20, 1956 DOA: 06/28/2023     1 DOS: the patient was seen and examined on 06/29/2023   Brief hospital course: 68 year old woman with PMH of T2DM, DVT, bipolar disorder, HTN, stage II lung cancer, wheelchair dependent, frequent admissions, recurrent UTIs SNF resident who presented to the ED from SNF for abdominal pain, altered mental status.  Patient also had urinary retention.  Admitted with diagnosis of stercoral colitis and underwent manual disimpaction in the ED.  GI was consulted and made recommendations and changes to her bowel regimen.  Assessment and Plan:   Abdominal pain Due to constipation, stercoral colitis. Much improved after bowel movement. -Continue bowel regimen.  Stercoral colitis Due to severe constipation. S/p manual disimpaction in the ED. GI consulted and recommend: Continue lactulose 20 g twice daily while inpatient, increase to 3 times a day. Continue lubiprostone 24 mcg twice daily Start Movantik 25 mg daily. She can receive Senokot as needed on discharge. Needs outpatient colonoscopy  UTI (recurrent)  Urinary retention -Catheter inserted in SNF. -Continue with Flomax. -Follow-up urine cultures.   - continue with meropenem in the setting of recent ESBL UTI   Acute metabolic encephalopathy Likely due to pain, possible UTI. Patient is alert and orented x 3 at baseline, per sister.  -Will continue to reorient.   Hypothyroidism -Does appear her methimazole was discontinued during recent admission for elevated TSH. TSH now 0.73, T4 1.03. - Will need to be rechecked in the next few weeks. Methimazole may need to be resumed.   Diabetes mellitus 2, insulin-dependent, poorly controlled with hyperglycemia -most recent A1c 10.9 last admission - hold metformin - continue home basal insulin and insulin sliding scale   Hypertension -Stable, not on any home medications   DVT in 4/26/224 -Continue  with Eliquis   MDD (major depressive disorder), recurrent severe - Continue with home lithium, level within normal limit -Continue with Seroquel - will resume home dose tonight.  -Resumed on lower dose gabapentin, increase to home dose once mentation back to baseline   Obesity, class I Body mass index is 34.93 kg/m.   DVT ppx: On systemic anticoagulation with Eliquis.      Subjective: Patient is confused and having some echolalia. She complains of back pain, lower extremity pain.  Home pain medications reordered.  Physical Exam: Vitals:   06/29/23 0737 06/29/23 0813 06/29/23 1232 06/29/23 1503  BP: 108/61 109/61 (!) 110/54 119/73  Pulse: 99 97 100 100  Resp: 18 18 19 20   Temp: 97.9 F (36.6 C) 99.3 F (37.4 C) 98.9 F (37.2 C) 98.5 F (36.9 C)  TempSrc: Oral   Oral  SpO2: 95% 96% 94% 96%  Weight:  107 kg    Height:  5\' 9"  (1.753 m)       General: Awake, confused Eyes: Pupils equal, reactive  Oral cavity: moist mucous membranes  Head: Atraumatic, normocephalic  Neck: supple  Chest: clear to auscultation. No crackles, no wheezes  CVS: S1,S2 RRR. No murmurs  Abd: No distention, soft, non-tender. No masses palpable  Extr: No edema   MSK: No joint deformities or swelling  Neurological: Grossly intact.    Data Reviewed:     Latest Ref Rng & Units 06/29/2023    3:55 AM 06/28/2023   11:58 AM 05/24/2023    4:06 AM  BMP  Glucose 70 - 99 mg/dL 956  213  086   BUN 8 - 23 mg/dL 6  8  14  Creatinine 0.44 - 1.00 mg/dL 1.61  0.96  0.45   Sodium 135 - 145 mmol/L 142  136  137   Potassium 3.5 - 5.1 mmol/L 3.6  2.6  3.7   Chloride 98 - 111 mmol/L 108  92  101   CO2 22 - 32 mmol/L 25  32  28   Calcium 8.9 - 10.3 mg/dL 9.1  9.8  9.3       Latest Ref Rng & Units 06/29/2023    3:55 AM 06/28/2023   11:58 AM 05/24/2023    4:06 AM  CBC  WBC 4.0 - 10.5 K/uL 6.3  8.5  5.9   Hemoglobin 12.0 - 15.0 g/dL 40.9  81.1  91.4   Hematocrit 36.0 - 46.0 % 44.7  45.6  39.6   Platelets 150 -  400 K/uL 276  283  221      Family Communication: I called and spoke with the patient's sister, who states that the patient has normal mentation at baseline but often is confused with her UTIs.   Disposition: Status is: Inpatient Remains inpatient appropriate because: she is receiving IV antibiotics for UTI. Patient also has acute metabolic encephalopathy.  Planned Discharge Destination: Skilled nursing facility    Time spent: 50 minutes  Author: MDALA-GAUSI, Gwenette Greet, MD 06/29/2023 3:40 PM  For on call review [email or web address].

## 2023-06-29 NOTE — Progress Notes (Signed)
SLP Cancellation Note  Patient Details Name: [name] MRN: [id number] DOB: [birth year]   Cancelled treatment:       Reason Eval/Treat Not Completed: Other (comment) (Pt shouting and yelling and inappropriate for SLE at this time. SLP attempted to redirect Pt unsuccessfully. SLP will be deferred at this time. She will likely be going back to [facility]. Please reconsult if SLE desired.)  Thank you,  [name], CCC-SLP [phone]  [name] 06/29/2023, 2:40 PM

## 2023-06-29 NOTE — TOC Initial Note (Signed)
Transition of Care [facility]) - Initial/Assessment Note    Patient Details  Name: [name] MRN: [id number] Date of Birth: 1955/12/28  [facility] CM/SW Contact:    Elliot Gault, LCSW Phone Number: 06/29/2023, 10:38 AM  Clinical Narrative:                  Pt admitted from [facility] long term care. MD anticipating dc in 2-3 days. Pt with high readmission risk score.  Pt not oriented today. Spoke with Debbie at [facility] to update. Per Eunice Blase, pt can readmit at dc.  TOC will follow and assist with dc planning.   Expected Discharge Plan: Long Term Nursing Home Barriers to Discharge: Continued Medical Work up   Patient Goals and CMS Choice Patient states their goals for this hospitalization and ongoing recovery are:: get better          Expected Discharge Plan and Services In-house Referral: Clinical Social Work     Living arrangements for the past 2 months: Skilled Nursing Facility                                      Prior Living Arrangements/Services Living arrangements for the past 2 months: Skilled Nursing Facility Lives with:: Facility Resident Patient language and need for interpreter reviewed:: Yes Do you feel safe going back to the place where you live?: Yes      Need for Family Participation in Patient Care: No (Comment) Care giver support system in place?: Yes (comment)   Criminal Activity/Legal Involvement Pertinent to Current Situation/Hospitalization: No - Comment as needed  Activities of Daily Living   ADL Screening (condition at time of admission) Independently performs ADLs?: No Does the patient have a NEW difficulty with bathing/dressing/toileting/self-feeding that is expected to last >3 days?: Yes (Initiates electronic notice to provider for possible OT consult) Does the patient have a NEW difficulty with getting in/out of bed, walking, or climbing stairs that is expected to last >3 days?: Yes (Initiates  electronic notice to provider for possible PT consult) Does the patient have a NEW difficulty with communication that is expected to last >3 days?: Yes (Initiates electronic notice to provider for possible SLP consult) Is the patient deaf or have difficulty hearing?: No Does the patient have difficulty seeing, even when wearing glasses/contacts?: No Does the patient have difficulty concentrating, remembering, or making decisions?: Yes  Permission Sought/Granted                  Emotional Assessment       Orientation: : Oriented to Self, Oriented to Place Alcohol / Substance Use: Not Applicable Psych Involvement: No (comment)  Admission diagnosis:  Urinary tract infection without hematuria, site unspecified [N39.0] Stercoral colitis [K52.89] Patient Active Problem List   Diagnosis Date Noted   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   Abscess of left great toe 05/22/2023   Cellulitis of left toe 05/21/2023   Diabetes (HCC) 05/21/2023   Sepsis due to Escherichia coli (E. coli) (HCC) 05/04/2023   E coli bacteremia 05/04/2023   Cystitis with hematuria 05/04/2023   Sepsis due to urinary tract infection (HCC) 05/01/2023   UTI due to extended-spectrum beta lactamase (ESBL) producing Escherichia coli 02/18/2023   HTN (hypertension) 02/18/2023   Septic shock (HCC) 02/14/2023   Sepsis (HCC) 02/14/2023   Controlled type 2 diabetes mellitus with hyperglycemia (HCC) 02/14/2023   DVT (deep venous thrombosis) 09/08/22  09/08/2022   Acute left ankle pain 12/13/2021   UTI (urinary tract infection) 08/03/2021   Hypernatremia 07/30/2021   Acute urinary retention 07/30/2021   Hypokalemia 07/30/2021   Hypotension 07/29/2021   Ankle wound, left 07/29/2021   Severe sepsis (HCC) 07/28/2021   Dyslipidemia 07/28/2021   Acute metabolic encephalopathy 07/28/2021   MDD (major depressive disorder), recurrent severe, without psychosis (HCC) 11/14/2017   Severe bipolar I disorder, most recent episode depressed  (HCC)    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 [place] [address] Phone: [phone] Fax: [phone]     Social Drivers of Health (SDOH) Social History: SDOH Screenings   Food Insecurity: No Food Insecurity (06/28/2023)  Housing: Low Risk  (06/28/2023)  Transportation Needs: No Transportation Needs (06/28/2023)  Utilities: Not At Risk (06/28/2023)  Alcohol Screen: Low Risk  (11/14/2017)  Social Connections: Socially Isolated (06/28/2023)  Tobacco Use: Medium Risk (06/28/2023)   SDOH Interventions:     Readmission Risk Interventions    06/29/2023   10:37 AM 05/24/2023    4:41 PM 05/22/2023    2:07 PM  Readmission Risk Prevention Plan  Transportation Screening Complete Complete Complete  Medication Review Oceanographer) Complete Complete Complete  PCP or Specialist appointment within 3-5 days of discharge   Complete  HRI or Home Care Consult Complete Complete Complete  SW Recovery Care/Counseling Consult Complete Complete Complete  Palliative Care Screening Not Applicable Not Applicable Not Applicable  Skilled Nursing Facility Complete Complete Complete

## 2023-06-29 NOTE — Progress Notes (Signed)
Pt arrived dto room 324 via stretcher from ED. Pt moved onto bed by staff members, pt states she does not walk, is wheelchair bound. Pt A&O x4 but has rambling, repetitive conversation at times, stuttering words, crying off and on, cursing at staff at times. Pt states she has been left alone at the nursing center for four days and has not been given any food or fluids to drink. Pt is requesting a tampon, "Because I'm on my period!". Foley care and peri care performed, pt has no menstrual bleeding. Bed alarm on for safety, pt given call bell and instructed on use with good return demonstration. Advised to call for needs.

## 2023-06-29 NOTE — ED Notes (Signed)
Pt c/o lower back pain, "from this bed".. Changed pt position in bed for comfort.

## 2023-06-29 NOTE — Care Management Important Message (Signed)
Important Message  Patient Details  Name: [name] MRN: [id number] Date of Birth: 30-Sep-1955   Important Message Given:  Yes - Medicare IM (reviewed letter telephonically at [phone] with family member Arnell Sieving)     Corey Harold 06/29/2023, 2:43 PM

## 2023-06-29 NOTE — Consult Note (Signed)
Gastroenterology Consult   Referring Provider: No ref. provider found Primary Care Physician:  Patient, [name] Per Primary Gastroenterologist:  Dr. Tasia Catchings  Patient ID: [name]; [id number]; [birth year]   Admit date: 06/28/2023  LOS: 1 day   Date of Consultation: 06/29/2023  Reason for Consultation:  stercoral colitis  History of Present Illness   [name] is a 68 y.o. year old female with history of bipolar disorder, substance abuse, GERD, lung cancer, frequent ESBL UTIs, HLD, DJD, MDD, type 2 diabetes, DVT, and is bedbound who presents from nursing facility due to complaints of abdominal distention and confusion.  Imaging findings consistent with fecal impaction and stercoral colitis. GI consulted for further assistance on management.  ED Course: Labs -stable CBC.  Potassium low at 2.6, normal kidney and liver function.  Lipase within normal limits.  UA with positive nitrates and moderate leukocytes. Urine culture pending Blood cultures obtained, currently no growth at less than 24 hours. CT A/P with large volume of stool throughout the colon, most pronounced in the rectum with dilated large stool ball measuring up to 9.8 cm in diameter.  There is associated mild circumferential rectal wall thickening and stranding consistent with stercoral colitis.  No evidence of bowel obstruction. Fecal disimpaction performed in ED.  Consult:  Patient seen today in her room.  She is oriented to self but is disoriented to place and situation.  She is not able to provide much history at all today given her confusion.  She does deny any abdominal pain.  Currently feels like she may have to have another bowel movement.  In conversation with nurse tech at bedside, she has not had a bowel movement since coming up to the floor today.  No stools documented in flowsheet other than what appears to be some stools that occurred after disimpaction in the ED.  Nursing note reviewed from this morning at  6:30 AM states that patient did have a bowel movement this morning and was cleaned up afterwards.  Thyroid levels within normal limits.  Per review of her home meds, it appears that patient has been on lactulose 30 mL twice a day as well as lubiprostone 24 mcg twice daily.  Previous hospitalization in December for episode of syncope while having a bowel movement.  At this time she had a CT abdomen and pelvis that also showed a moderate to large volume of stool throughout the colon with dilation of the rectum with a 9 cm stool ball noted as well as rectal wall thickening and mild stranding.  She was also admitted at that time with sepsis secondary to UTI.  Per results from SNF she received Senokot 1 tablet twice daily and had an as needed order for lactulose and bisacodyl suppositories.  At that time patient stated she rarely took these.  At this time she stated she preferred her medications to be scheduled given she feels as though staff appears bothered when she asks for medications.  Previously reported about 1 bowel movement per week, and does take pain medication 4 times per day.  At this time she reported never having a colonoscopy but also denied any family history of colon cancer.  She was given GoLytely prep for colon purge/impaction as well as milk of molasses enema, after purge she was recommended Linzess 290 and if not effective to consider Movantik.  She was recommended to have an outpatient colonoscopy, follow-up was recommended outpatient however was never scheduled.   Past Medical History:  Diagnosis Date   Allergy    Bipolar disorder (HCC)    Depression    Drug abuse (HCC)    history of, went to rehab 2015   GERD (gastroesophageal reflux disease)    Lung cancer (HCC)    lung ca dx 11/11- right upper lobe    Past Surgical History:  Procedure Laterality Date   ABDOMINAL AORTOGRAM W/LOWER EXTREMITY Bilateral 01/10/2022   Procedure: ABDOMINAL AORTOGRAM W/LOWER EXTREMITY;  Surgeon:  Nada Libman, MD;  Location: [facility];  Service: Cardiovascular;  Laterality: Bilateral;   ANKLE FRACTURE SURGERY Left    HARDWARE REMOVAL Left 12/13/2021   Procedure: LEFT ANKLE HARDWARE REMOVAL;  Surgeon: Marcene Corning, MD;  Location: [facility];  Service: Orthopedics;  Laterality: Left;   LUNG LOBECTOMY Right 2011   RUL removed for lung cancer   STYLOID PROCESS EXCISION Left 10/16/2014   Procedure: EXCISION LEFT STYLOID PROCESS;  Surgeon: Drema Halon, MD;  Location: [facility];  Service: ENT;  Laterality: Left;   TONSILLECTOMY Bilateral 10/16/2014   Procedure: BILATERAL TONSILLECTOMY;  Surgeon: Drema Halon, MD;  Location: [facility];  Service: ENT;  Laterality: Bilateral;   TUBAL LIGATION  1984    Prior to Admission medications   Medication Sig Start Date End Date Taking? Authorizing Provider  acetaminophen (TYLENOL) 325 MG tablet Take 650 mg by mouth every 4 (four) hours as needed for mild pain.   Yes [provider]  ascorbic acid (VITAMIN C) 500 MG tablet Take 500 mg by mouth 2 (two) times daily.   Yes [provider]  bisacodyl (DULCOLAX) 10 MG suppository Place 10 mg rectally as needed for moderate constipation.   Yes [provider]  calcium carbonate (TUMS - DOSED IN MG ELEMENTAL CALCIUM) 500 MG chewable tablet Chew 1 tablet by mouth every 5 (five) hours as needed for indigestion or heartburn.   Yes [provider]  cetirizine (ZYRTEC) 10 MG tablet Take 10 mg by mouth daily. For allergies   Yes [provider]  ELIQUIS 5 MG TABS tablet Take 5 mg by mouth 2 (two) times daily. 01/18/23  Yes [provider]  folic acid (FOLVITE) 1 MG tablet Take 1 mg by mouth daily.   Yes [provider]  gabapentin (NEURONTIN) 400 MG capsule Take 400 mg by mouth 3 (three) times daily. 06/12/23 06/29/23 Yes [provider]  insulin aspart (FIASP FLEXTOUCH) 100 UNIT/ML FlexTouch Pen  Inject 0-10 Units into the skin 4 (four) times daily -  before meals and at bedtime. Short acting insulin (Aspart) per sliding scale 0-10 ---:-- Insulin injection 0-10 Units 0-10 Units Subcutaneous, 3 times daily with meals CBG 70 - 120: 0 unit CBG 121 - 150: 0 unit  CBG 151 - 200: 1 unit CBG 201 - 250: 2 units CBG 251 - 300: 4 units CBG 301 - 350: 6 units  CBG 351 - 400: 8 units  CBG > 400: 10 units 02/16/23  Yes Emokpae, Courage, MD  Insulin Glargine (BASAGLAR KWIKPEN) 100 UNIT/ML Inject 10 Units into the skin daily. 02/22/23  Yes Osvaldo Shipper, MD  LACTOBACILLUS PO Take 1 capsule by mouth daily.   Yes [provider]  lactulose (CHRONULAC) 10 GM/15ML solution Take 30 mLs by mouth 2 (two) times daily. 01/18/23  Yes [provider]  lactulose (CHRONULAC) 10 GM/15ML solution Take 10 g by mouth daily as needed for moderate constipation.   Yes [provider]  lidocaine 4 %  Place 1 patch onto the skin daily. Remove & Discard patch within 12 hours or as directed by MD; Apply to LOWER LEG Listed as Asperflex Pain Relieving Patch 4% on MAR   Yes [provider]  lithium 600 MG capsule Take 600 mg by mouth daily. 11/24/22  Yes [provider]  lithium carbonate 300 MG capsule Take 300 mg by mouth at bedtime.   Yes [provider]  loperamide (IMODIUM A-D) 2 MG tablet Take 2 mg by mouth every 6 (six) hours as needed for diarrhea or loose stools.   Yes [provider]  lubiprostone (AMITIZA) 24 MCG capsule Take 24 mcg by mouth 2 (two) times daily with a meal.   Yes [provider]  Melatonin 10 MG TABS Take 10 mg by mouth at bedtime.   Yes [provider]  metFORMIN (GLUCOPHAGE) 500 MG tablet Take 1 pill a day Patient taking differently: Take 500 mg by mouth daily with breakfast. Take 1 pill a day 12/17/22  Yes Bethann Berkshire, MD  Multiple Vitamin (MULTIVITAMIN ADULT PO) Take 1 tablet by mouth daily.   Yes [provider]   ondansetron (ZOFRAN) 4 MG tablet Take 4 mg by mouth every 8 (eight) hours as needed for nausea or vomiting. 12/14/22  Yes [provider]  oxyCODONE-acetaminophen (PERCOCET) 10-325 MG tablet Take 1 tablet by mouth every 6 (six) hours as needed for pain. Patient taking differently: Take 1 tablet by mouth every 4 (four) hours as needed for pain. 05/07/23  Yes Johnson, Clanford L, MD  phenol (CHLORASEPTIC) 1.4 % LIQD Use as directed 5 sprays in the mouth or throat every 2 (two) hours as needed for throat irritation / pain.   Yes [provider]  polyethylene glycol (MIRALAX / GLYCOLAX) 17 g packet Take 17 g by mouth 2 (two) times daily. 05/07/23  Yes Johnson, Clanford L, MD  PRESCRIPTION MEDICATION Apply 1 Application topically in the morning, at noon, in the evening, and at bedtime. Muscle Rub External Cream 10-15% (Menthol-Methyl Salicylate (Liniments))  Apply to feet for pain   Yes [provider]  QUEtiapine (SEROQUEL) 300 MG tablet Take 300 mg by mouth at bedtime. 06/22/23  Yes [provider]  QUEtiapine (SEROQUEL) 50 MG tablet Take 50 mg by mouth daily.   Yes [provider]  tamsulosin (FLOMAX) 0.4 MG CAPS capsule Take 0.4 mg by mouth daily.   Yes [provider]  tiZANidine (ZANAFLEX) 2 MG tablet Take 2 mg by mouth 3 (three) times daily. On 06-29-23 @2359  Give 2 tabs po tid 06/12/23  Yes [provider]  torsemide (DEMADEX) 20 MG tablet Take 20 mg by mouth 2 (two) times daily. 06/10/23 06/25/23  [provider]  ARIPiprazole (ABILIFY) 10 MG tablet Take 10 mg by mouth daily.  04/20/23  [provider]    Current Facility-Administered Medications  Medication Dose Route Frequency Provider Last Rate Last Admin   acetaminophen (TYLENOL) tablet 650 mg  650 mg Oral Q6H PRN Elgergawy, Leana Roe, MD   650 mg at 06/28/23 2040   Or   acetaminophen (TYLENOL) suppository 650 mg  650 mg Rectal Q6H PRN Elgergawy, Leana Roe, MD        albuterol (PROVENTIL) (2.5 MG/3ML) 0.083% nebulizer solution 2.5 mg  2.5 mg Nebulization Q2H PRN Elgergawy, Leana Roe, MD       apixaban (ELIQUIS) tablet 5 mg  5 mg Oral BID Elgergawy, Leana Roe, MD   5 mg at 06/29/23 1105  ascorbic acid (VITAMIN C) tablet 500 mg  500 mg Oral BID Elgergawy, Leana Roe, MD   500 mg at 06/29/23 1106   calcium carbonate (TUMS - dosed in mg elemental calcium) chewable tablet 200 mg of elemental calcium  1 tablet Oral Q5H PRN Elgergawy, Leana Roe, MD       Chlorhexidine Gluconate Cloth 2 % PADS 6 each  6 each Topical Daily Mdala-Gausi, Masiku Agatha, MD       folic acid (FOLVITE) tablet 1 mg  1 mg Oral Daily Elgergawy, Leana Roe, MD   1 mg at 06/29/23 1107   gabapentin (NEURONTIN) capsule 200 mg  200 mg Oral BID Elgergawy, Leana Roe, MD   200 mg at 06/29/23 1106   hydrALAZINE (APRESOLINE) injection 5 mg  5 mg Intravenous Q4H PRN Elgergawy, Leana Roe, MD       insulin aspart (novoLOG) injection 0-15 Units  0-15 Units Subcutaneous TID WC Elgergawy, Leana Roe, MD   2 Units at 06/29/23 0911   insulin aspart (novoLOG) injection 0-5 Units  0-5 Units Subcutaneous QHS Elgergawy, Leana Roe, MD       insulin glargine-yfgn (SEMGLEE) injection 10 Units  10 Units Subcutaneous QHS Angelique Blonder, RPH   10 Units at 06/28/23 2214   lactated ringers infusion   Intravenous Continuous Elgergawy, Leana Roe, MD 75 mL/hr at 06/29/23 1610 Infusion Verify at 06/29/23 9604   lactulose (CHRONULAC) 10 GM/15ML solution 10 g  10 g Oral Daily PRN Elgergawy, Leana Roe, MD       lactulose (CHRONULAC) 10 GM/15ML solution 20 g  20 g Oral BID Elgergawy, Leana Roe, MD   20 g at 06/29/23 1107   lithium carbonate capsule 300 mg  300 mg Oral QHS Elgergawy, Leana Roe, MD   300 mg at 06/28/23 2207   lithium carbonate capsule 600 mg  600 mg Oral Daily Elgergawy, Leana Roe, MD   600 mg at 06/29/23 1104   lubiprostone (AMITIZA) capsule 24 mcg  24 mcg Oral BID WC Elgergawy, Leana Roe, MD   24 mcg at 06/29/23 0901   melatonin  tablet 9 mg  9 mg Oral QHS Elgergawy, Leana Roe, MD   9 mg at 06/28/23 2207   meropenem (MERREM) 1 g in sodium chloride 0.9 % 100 mL IVPB  1 g Intravenous Q8H Elgergawy, Dawood S, MD 200 mL/hr at 06/29/23 1103 1 g at 06/29/23 1103   oxyCODONE-acetaminophen (PERCOCET/ROXICET) 5-325 MG per tablet 1 tablet  1 tablet Oral Q6H PRN Mdala-Gausi, Gwenette Greet, MD   1 tablet at 06/29/23 1116   And   oxyCODONE (Oxy IR/ROXICODONE) immediate release tablet 5 mg  5 mg Oral Q6H PRN Mdala-Gausi, Masiku Agatha, MD       polyethylene glycol-electrolytes (NuLYTELY) solution 4,000 mL  4,000 mL Oral Once Rondel Baton, MD       QUEtiapine (SEROQUEL) tablet 200 mg  200 mg Oral QHS Elgergawy, Leana Roe, MD   200 mg at 06/28/23 2207   QUEtiapine (SEROQUEL) tablet 50 mg  50 mg Oral Daily Elgergawy, Leana Roe, MD   50 mg at 06/29/23 1106   tamsulosin (FLOMAX) capsule 0.4 mg  0.4 mg Oral Daily Elgergawy, Leana Roe, MD   0.4 mg at 06/29/23 1106    Allergies as of 06/28/2023 - Review Complete 06/28/2023  Allergen Reaction Noted   Sulfa antibiotics Swelling and Rash 08/30/2010    Family History  Problem Relation Age of Onset   Cancer Mother  breast cancer   Cancer Father        stomach cancer   Diabetes Father    Cancer Sister        breast cancer   Diabetes Brother    Diabetes Brother    Cancer Sister        breast cancer    Social History   Socioeconomic History   Marital status: Single    Spouse name: Not on file   Number of children: Not on file   Years of education: Not on file   Highest education level: Not on file  Occupational History   Not on file  Tobacco Use   Smoking status: Former    Current packs/day: 1.00    Average packs/day: 1 pack/day for 40.0 years (40.0 ttl pk-yrs)    Types: Cigarettes   Smokeless tobacco: Never  Vaping Use   Vaping status: Former  Substance and Sexual Activity   Alcohol use: Not Currently    Comment: hx alcoholism.  none since 2017   Drug use: Not  Currently    Types: Benzodiazepines    Comment: hx of xanax abuse. last use 2015   Sexual activity: Not on file  Other Topics Concern   Not on file  Social History Narrative   Not on file   Social Drivers of Health   Financial Resource Strain: Not on file  Food Insecurity: No Food Insecurity (06/28/2023)   Hunger Vital Sign    Worried About Running Out of Food in the Last Year: Never true    Ran Out of Food in the Last Year: Never true  Transportation Needs: No Transportation Needs (06/28/2023)   PRAPARE - Administrator, Civil Service (Medical): No    Lack of Transportation (Non-Medical): No  Physical Activity: Not on file  Stress: Not on file  Social Connections: Socially Isolated (06/28/2023)   Social Connection and Isolation Panel [NHANES]    Frequency of Communication with Friends and Family: Three times a week    Frequency of Social Gatherings with Friends and Family: Never    Attends Religious Services: Never    Database administrator or Organizations: No    Attends Banker Meetings: Never    Marital Status: Never married  Intimate Partner Violence: Not At Risk (06/28/2023)   Humiliation, Afraid, Rape, and Kick questionnaire    Fear of Current or Ex-Partner: No    Emotionally Abused: No    Physically Abused: No    Sexually Abused: No     Review of Systems   UTA ROS given patient confusion.  Physical Exam   Vital Signs in last 24 hours: Temp:  [97.9 F (36.6 C)-99.3 F (37.4 C)] 98.9 F (37.2 C) (02/14 1232) Pulse Rate:  [83-106] 100 (02/14 1232) Resp:  [13-22] 19 (02/14 1232) BP: (99-155)/(45-143) 110/54 (02/14 1232) SpO2:  [91 %-99 %] 94 % (02/14 1232) Weight:  [696 kg] 107 kg (02/14 0813) Last BM Date : 06/28/23  General:   Alert, confused, intermittently tearful. Head:  Normocephalic and atraumatic. Eyes:  Sclera clear, no icterus.   Conjunctiva pink. Ears:  Normal auditory acuity. Mouth:  No deformity or lesions, dentition  normal. Abdomen:  Soft, nontender and nondistended. Normal bowel sounds, without guarding, and without rebound.   Rectal: Deferred Neurologic:  Alert and oriented to self.  Disoriented to time, place and situation Psych:  Alert.  Intermittently tearful  Intake/Output from previous day: 02/13 0701 - 02/14 0700 In: 117.3 [  IV Piggyback:117.3] Out: -  Intake/Output this shift: Total I/O In: 300 [P.O.:300] Out: -    Labs/Studies   Recent Labs Recent Labs    06/28/23 1158 06/29/23 0355  WBC 8.5 6.3  HGB 14.4 14.1  HCT 45.6 44.7  PLT 283 276   BMET Recent Labs    06/28/23 1158 06/29/23 0355  NA 136 142  K 2.6* 3.6  CL 92* 108  CO2 32 25  GLUCOSE 209* 152*  BUN 8 6*  CREATININE 0.51 0.40*  CALCIUM 9.8 9.1   LFT Recent Labs    06/28/23 1158  PROT 7.4  ALBUMIN 3.6  AST 17  ALT 15  ALKPHOS 121  BILITOT 0.7   PT/INR No results for input(s): "LABPROT", "INR" in the last 72 hours. Hepatitis Panel No results for input(s): "HEPBSAG", "HCVAB", "HEPAIGM", "HEPBIGM" in the last 72 hours. C-Diff No results for input(s): "CDIFFTOX" in the last 72 hours.  Radiology/Studies CT ABDOMEN PELVIS W CONTRAST Result Date: 06/28/2023 CLINICAL DATA:  Abdominal pain and distention. Concern for bowel obstruction. EXAM: CT ABDOMEN AND PELVIS WITH CONTRAST TECHNIQUE: Multidetector CT imaging of the abdomen and pelvis was performed using the standard protocol following bolus administration of intravenous contrast. RADIATION DOSE REDUCTION: This exam was performed according to the departmental dose-optimization program which includes automated exposure control, adjustment of the mA and/or kV according to patient size and/or use of iterative reconstruction technique. CONTRAST:  OMNIPAQUE IOHEXOL 300 MG/ML  SOLN COMPARISON:  CTA chest, abdomen, and pelvis dated 05/01/2023. FINDINGS: Lower chest: Mild left basilar atelectasis. Hepatobiliary: Hepatomegaly. No focal liver abnormality is  seen. No gallstones, gallbladder wall thickening, or biliary dilatation. Pancreas: Unremarkable. No pancreatic ductal dilatation or surrounding inflammatory changes. Spleen: Normal in size without focal abnormality. Adrenals/Urinary Tract: Adrenal glands are unremarkable. Stable longstanding 2.9 cm hyperattenuating lesion along the medial aspect of the left kidney, which requires no specific follow-up imaging. Stable nonobstructive punctate left renal calculus. The right kidney is unremarkable. No hydronephrosis. Bladder is decompressed with Foley catheter in place. Stomach/Bowel: Small hiatal hernia, unchanged. Small bowel is grossly unremarkable. No evidence of obstruction. Large volume of stool throughout the colon, most pronounced in the rectum, which is dilated with a large stool ball measuring up to 9.8 cm in diameter. There is mild circumferential rectal wall thickening and stranding. Vascular/Lymphatic: The abdominal aorta is normal in caliber with atherosclerotic calcification. No enlarged abdominal or pelvic lymph nodes. Reproductive: Atrophic uterus.  No adnexal masses. Other: No abdominopelvic ascites. No intraperitoneal free air. No abdominal wall hernia. Musculoskeletal: No acute osseous abnormality. No suspicious osseous lesion. 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 IMPRESSION: 1. Large volume of stool throughout the colon. The rectum is dilated with a large stool ball measuring up to 9.8 cm in diameter with mild circumferential wall thickening and stranding, concerning for stercoral colitis. 2. No evidence of obstruction. 3. Additional unchanged chronic findings, as described above. Aortic Atherosclerosis (ICD10-I70.0). Electronically Signed   By: Hart Robinsons M.D.   On: 06/28/2023 16:04   Assessment   [name] is a 69 y.o. year old female with history of bipolar disorder, substance abuse, GERD, lung cancer, frequent ESBL UTIs, HLD, DJD, MDD, type 2 diabetes, DVT, and is bedbound  who presents from nursing facility due to complaints of abdominal distention and confusion. GI consulted for further assistance on management given stercoral colitis and evidence of fecal impaction on imaging.  Stercoral colitis, fecal impaction: Recent hospitalization in December for the  same.  She was recommended to be discharged on Linzess and if not having improvement to start Movantik.  She was advised to stay on MiraLAX twice daily.  Prior to last hospitalization, she was only getting Senokot 1 tablet daily.  PTA meds reveal lubiprostone 24 mcg twice daily and lactulose 30 mL twice daily.  She received GoLytely yesterday as well as manual disimpaction in the ED with good results of stools.  Abdominal exam today benign, patient denies any significant abdominal pain.  She was continued on home regimen this morning, given her chronic narcotic use outpatient, she would benefit from [facility].  Her immobility and narcotic use is likely contributing to recurrent fecal impaction and stercoral colitis.  Plan / Recommendations   Continue lactulose 20 g twice daily while inpatient, increase to 3 times a day. Continue lubiprostone 24 mcg twice daily Start Movantik 25 mg daily. She can receive Senokot as needed on discharge. Needs outpatient colonoscopy     06/29/2023, 1:03 PM  Brooke Bonito, MSN, FNP-BC, AGACNP-BC [facility]

## 2023-06-29 NOTE — Progress Notes (Signed)
PT Cancellation Note  Patient Details Name: ANALYSSA DOWNS MRN: [id number] DOB: 1955-05-21   Cancelled Treatment:    Reason Eval/Treat Not Completed: PT screened, no needs identified, will sign off.  Patient at baseline non-ambulatory, assisted for transfers, wheelchair bound.   2:14 PM, 06/29/23 Ocie Bob, MPT Physical Therapist with [facility] [phone] office [phone] mobile phone

## 2023-06-29 NOTE — ED Notes (Signed)
Pt was changed after having a bowel movement, stat lock was replaced on her leg, as well as new pads placed under her. She was repositioned in bed and had her foley abg drained with a volume of 1800 ml in the bag. She is now resting comfortably in her bed and not in distress

## 2023-06-29 NOTE — [place] FL2 (Signed)
[place] MEDICAID FL2 LEVEL OF CARE FORM     IDENTIFICATION  Patient Name: [name] Birthdate: 1955/10/19 Sex: female Admission Date (Current Location): 06/28/2023  [facility] and [place] Number:  [employer] and Address:  [facility],  [address], [place] [address]      Provider Number: [id number]  Attending Physician Name and Address:  Mdala-Gausi, Masiku Agat*  Relative Name and Phone Number:       Current Level of Care: Hospital Recommended Level of Care: Skilled Nursing Facility Prior Approval Number:    Date Approved/Denied:   PASRR Number:    Discharge Plan: SNF    Current Diagnoses: Patient Active Problem List   Diagnosis Date Noted   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   Abscess of left great toe 05/22/2023   Cellulitis of left toe 05/21/2023   Diabetes (HCC) 05/21/2023   Sepsis due to Escherichia coli (E. coli) (HCC) 05/04/2023   E coli bacteremia 05/04/2023   Cystitis with hematuria 05/04/2023   Sepsis due to urinary tract infection (HCC) 05/01/2023   UTI due to extended-spectrum beta lactamase (ESBL) producing Escherichia coli 02/18/2023   HTN (hypertension) 02/18/2023   Septic shock (HCC) 02/14/2023   Sepsis (HCC) 02/14/2023   Controlled type 2 diabetes mellitus with hyperglycemia (HCC) 02/14/2023   DVT (deep venous thrombosis) 09/08/22 09/08/2022   Acute left ankle pain 12/13/2021   UTI (urinary tract infection) 08/03/2021   Hypernatremia 07/30/2021   Acute urinary retention 07/30/2021   Hypokalemia 07/30/2021   Hypotension 07/29/2021   Ankle wound, left 07/29/2021   Severe sepsis (HCC) 07/28/2021   Dyslipidemia 07/28/2021   Acute metabolic encephalopathy 07/28/2021   MDD (major depressive disorder), recurrent severe, without psychosis (HCC) 11/14/2017   Severe bipolar I disorder, most recent episode depressed (HCC)    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/2015   Lung cancer (HCC)  04/24/2011    Orientation RESPIRATION BLADDER Height & Weight     Self, Time, Situation, Place  Normal Incontinent Weight: 235 lb 14.3 oz (107 kg) Height:  5\' 9"  (175.3 cm)  BEHAVIORAL SYMPTOMS/MOOD NEUROLOGICAL BOWEL NUTRITION STATUS      Incontinent Diet (see dc summary)  AMBULATORY STATUS COMMUNICATION OF NEEDS Skin   Total Care Verbally Normal                       Personal Care Assistance Level of Assistance  Bathing, Feeding, Dressing Bathing Assistance: Maximum assistance Feeding assistance: Independent Dressing Assistance: Maximum assistance     Functional Limitations Info  Sight, Hearing, Speech Sight Info: Adequate Hearing Info: Adequate Speech Info: Adequate    SPECIAL CARE FACTORS FREQUENCY                       Contractures Contractures Info: Not present    Additional Factors Info  Code Status, Allergies Code Status Info: DNR Allergies Info: sulfa antibiotics           Current Medications (06/29/2023):  This is the current hospital active medication list Current Facility-Administered Medications  Medication Dose Route Frequency Provider Last Rate Last Admin   acetaminophen (TYLENOL) tablet 650 mg  650 mg Oral Q6H PRN Elgergawy, Leana Roe, MD   650 mg at 06/28/23 2040   Or   acetaminophen (TYLENOL) suppository 650 mg  650 mg Rectal Q6H PRN Elgergawy, Leana Roe, MD       albuterol (PROVENTIL) (2.5 MG/3ML) 0.083% nebulizer solution  2.5 mg  2.5 mg Nebulization Q2H PRN Elgergawy, Leana Roe, MD       apixaban Everlene Balls) tablet 5 mg  5 mg Oral BID Elgergawy, Leana Roe, MD   5 mg at 06/28/23 2207   ascorbic acid (VITAMIN C) tablet 500 mg  500 mg Oral BID Elgergawy, Leana Roe, MD   500 mg at 06/28/23 2207   calcium carbonate (TUMS - dosed in mg elemental calcium) chewable tablet 200 mg of elemental calcium  1 tablet Oral Q5H PRN Elgergawy, Leana Roe, MD       folic acid (FOLVITE) tablet 1 mg  1 mg Oral Daily Elgergawy, Leana Roe, MD       gabapentin  (NEURONTIN) capsule 200 mg  200 mg Oral BID Elgergawy, Leana Roe, MD       hydrALAZINE (APRESOLINE) injection 5 mg  5 mg Intravenous Q4H PRN Elgergawy, Leana Roe, MD       insulin aspart (novoLOG) injection 0-15 Units  0-15 Units Subcutaneous TID WC Elgergawy, Leana Roe, MD   2 Units at 06/29/23 0911   insulin aspart (novoLOG) injection 0-5 Units  0-5 Units Subcutaneous QHS Elgergawy, Leana Roe, MD       insulin glargine-yfgn (SEMGLEE) injection 10 Units  10 Units Subcutaneous QHS Angelique Blonder, RPH   10 Units at 06/28/23 2214   lactated ringers infusion   Intravenous Continuous Elgergawy, Leana Roe, MD 75 mL/hr at 06/29/23 4098 Infusion Verify at 06/29/23 1191   lactulose (CHRONULAC) 10 GM/15ML solution 10 g  10 g Oral Daily PRN Elgergawy, Leana Roe, MD       lactulose (CHRONULAC) 10 GM/15ML solution 20 g  20 g Oral BID Elgergawy, Leana Roe, MD   20 g at 06/28/23 2212   lithium carbonate capsule 300 mg  300 mg Oral QHS Elgergawy, Leana Roe, MD   300 mg at 06/28/23 2207   lithium carbonate capsule 600 mg  600 mg Oral Daily Elgergawy, Leana Roe, MD       lubiprostone (AMITIZA) capsule 24 mcg  24 mcg Oral BID WC Elgergawy, Leana Roe, MD   24 mcg at 06/29/23 0901   melatonin tablet 9 mg  9 mg Oral QHS Elgergawy, Leana Roe, MD   9 mg at 06/28/23 2207   meropenem (MERREM) 1 g in sodium chloride 0.9 % 100 mL IVPB  1 g Intravenous Q8H Elgergawy, Leana Roe, MD   Stopped at 06/29/23 0220   polyethylene glycol-electrolytes (NuLYTELY) solution 4,000 mL  4,000 mL Oral Once Rondel Baton, MD       QUEtiapine (SEROQUEL) tablet 200 mg  200 mg Oral QHS Elgergawy, Leana Roe, MD   200 mg at 06/28/23 2207   QUEtiapine (SEROQUEL) tablet 50 mg  50 mg Oral Daily Elgergawy, Leana Roe, MD       tamsulosin (FLOMAX) capsule 0.4 mg  0.4 mg Oral Daily Elgergawy, Leana Roe, MD   0.4 mg at 06/28/23 2207     Discharge Medications: Please see discharge summary for a list of discharge medications.  Relevant Imaging  Results:  Relevant Lab Results:   Additional Information    Elliot Gault, LCSW

## 2023-06-30 ENCOUNTER — Inpatient Hospital Stay (HOSPITAL_COMMUNITY): Payer: Medicare Other

## 2023-06-30 LAB — BASIC METABOLIC PANEL
Anion gap: 10 (ref 5–15)
BUN: <5 mg/dL — ABNORMAL LOW (ref 8–23)
CO2: 27 mmol/L (ref 22–32)
Calcium: 9.9 mg/dL (ref 8.9–10.3)
Chloride: 110 mmol/L (ref 98–111)
Creatinine, Ser: 0.42 mg/dL — ABNORMAL LOW (ref 0.44–1.00)
GFR, Estimated: >60 mL/min (ref >60)
Glucose, Bld: 185 mg/dL — ABNORMAL HIGH (ref 70–99)
Potassium: 3.4 mmol/L — ABNORMAL LOW (ref 3.5–5.1)
Sodium: 147 mmol/L — ABNORMAL HIGH (ref 135–145)

## 2023-06-30 LAB — GLUCOSE, CAPILLARY
Glucose-Capillary: 175 mg/dL — ABNORMAL HIGH (ref 70–99)
Glucose-Capillary: 177 mg/dL — ABNORMAL HIGH (ref 70–99)
Glucose-Capillary: 151 mg/dL — ABNORMAL HIGH (ref 70–99)
Glucose-Capillary: 141 mg/dL — ABNORMAL HIGH (ref 70–99)
Glucose-Capillary: 192 mg/dL — ABNORMAL HIGH (ref 70–99)

## 2023-06-30 MED ORDER — PANTOPRAZOLE SODIUM 40 MG IV SOLR
40.0000 mg | INTRAVENOUS | Status: DC
Start: 2023-06-30 — End: 2023-07-05
  Administered 2023-06-30 – 2023-07-05 (×6): 40 mg via INTRAVENOUS
  Filled 2023-06-30 (×6): qty 10

## 2023-06-30 MED ORDER — ONDANSETRON HCL 4 MG/2ML IJ SOLN
4.0000 mg | Freq: Four times a day (QID) | INTRAMUSCULAR | Status: DC | PRN
  Administered 2023-06-30 – 2023-07-02 (×2): 4 mg via INTRAVENOUS
  Filled 2023-06-30 (×2): qty 2

## 2023-06-30 MED ORDER — BISACODYL 10 MG RE SUPP
10.0000 mg | Freq: Once | RECTAL | Status: AC
  Administered 2023-06-30: 10 mg via RECTAL
  Filled 2023-06-30: qty 1

## 2023-06-30 MED ORDER — POTASSIUM CHLORIDE CRYS ER 20 MEQ PO TBCR
40.0000 meq | EXTENDED_RELEASE_TABLET | Freq: Once | ORAL | Status: AC
  Administered 2023-06-30: 40 meq via ORAL
  Filled 2023-06-30: qty 2

## 2023-06-30 NOTE — Progress Notes (Signed)
MD informed of  the following- Pt has vomited 9 times since 7p. 6 episodes in last 60 minutes. No complaints of nausea prior to vomiting. She did yell that her stomach hurt. pt unable to describe abd pain further due to cognitive state.

## 2023-06-30 NOTE — Progress Notes (Signed)
1230- admin Dulcolax PR per order. Large, hard stool palpated in rectal canal.  1600- Large, hard stool cleaned up from patient.  1745- assisted patient with small amount of meal, consumed all of beverage. Patient refused to eat very much.

## 2023-06-30 NOTE — Progress Notes (Signed)
Progress Note   Patient: [name] EAV:[id number] DOB: Jan 11, 1956 DOA: 06/28/2023     2 DOS: the patient was seen and examined on 06/30/2023   Brief hospital course: 68 year old woman with PMH of T2DM, DVT, bipolar disorder, HTN, stage II lung cancer, wheelchair dependent, frequent admissions, recurrent UTIs SNF resident who presented to the ED from SNF for abdominal pain, altered mental status.  Patient also had urinary retention.  Admitted with diagnosis of stercoral colitis and underwent manual disimpaction in the ED.  GI was consulted and made recommendations and changes to her bowel regimen.  Assessment and Plan:   Abdominal pain Due to constipation, stercoral colitis. Much improved after bowel movement. -Continue bowel regimen.  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 S/p manual disimpaction in the ED. GI consulted and noted constipation likely drug-induced as patient is on chronic opioids without an adequate bowel regimen. GI recommend:  Continue lactulose 20 g twice daily while inpatient, increase to 3 times a day. Continue lubiprostone 24 mcg twice daily Start Movantik 25 mg daily. She can receive Senokot as needed on discharge. Needs outpatient colonoscopy  UTI (recurrent)  Urinary retention Per GI recurrent UTIs likely due to obstructive uropathy due to underlying severe constipation Catheter inserted at SNF. Urine cultures are growing GNR's -Continue with Flomax. -Follow-up urine cultures.   - continue with meropenem in the setting of recent ESBL UTI   Acute metabolic encephalopathy Due to UTI. Patient is alert and orented x 3 at baseline, per sister.  -Will continue to reorient. -Continue treatment of UTI  Hypokalemia - Replete as needed.    Hypothyroidism -Does appear her methimazole was discontinued during recent admission for elevated TSH. TSH now 0.73, T4 1.03. - Will need to  be rechecked in the next few weeks. Methimazole may need to be resumed.   Diabetes mellitus 2, insulin-dependent, poorly controlled with hyperglycemia -most recent A1c 10.9 last admission - hold metformin - continue home basal insulin and insulin sliding scale   H/o DVT   -Continue with Eliquis   MDD (major depressive disorder), recurrent severe - Continue with home lithium, level within normal limit -Continue with Seroquel - home dose resumed given agitation.  -Resumed on lower dose gabapentin, increase to home dose once mentation back to baseline   Obesity, class I Body mass index is 34.93 kg/m.   DVT ppx: On systemic anticoagulation with Eliquis.      Subjective: Patient is still confused but seems a little better mentally than yesterday. She says her back pain is better. She has had several episodes of vomiting since last night. No bowel movements noted today.   Physical Exam: Vitals:   06/29/23 0813 06/29/23 1232 06/29/23 1503 06/29/23 2000  BP: 109/61 (!) 110/54 119/73 115/62  Pulse: 97 100 100   Resp: 18 19 20 18   Temp: 99.3 F (37.4 C) 98.9 F (37.2 C) 98.5 F (36.9 C) 98.1 F (36.7 C)  TempSrc:   Oral Oral  SpO2: 96% 94% 96% 95%  Weight: 107 kg     Height: 5\' 9"  (1.753 m)        General: Awake, confused Eyes: Pupils equal, reactive  Oral cavity: moist mucous membranes  Head: Atraumatic, normocephalic  Neck: supple  Chest: clear to auscultation. No crackles, no wheezes  CVS: S1,S2 RRR. No murmurs  Abd: No distention, soft, non-tender. No masses palpable  Extr: No edema   MSK: No joint deformities or  swelling  Neurological: Grossly intact.    Data Reviewed:     Latest Ref Rng & Units 06/30/2023    5:22 AM 06/29/2023    3:55 AM 06/28/2023   11:58 AM  BMP  Glucose 70 - 99 mg/dL 161  096  045   BUN 8 - 23 mg/dL 5  6  8    Creatinine 0.44 - 1.00 mg/dL 4.09  8.11  9.14   Sodium 135 - 145 mmol/L 147  142  136   Potassium 3.5 - 5.1 mmol/L 3.4  3.6  2.6    Chloride 98 - 111 mmol/L 110  108  92   CO2 22 - 32 mmol/L 27  25  32   Calcium 8.9 - 10.3 mg/dL 9.9  9.1  9.8       Latest Ref Rng & Units 06/29/2023    3:55 AM 06/28/2023   11:58 AM 05/24/2023    4:06 AM  CBC  WBC 4.0 - 10.5 K/uL 6.3  8.5  5.9   Hemoglobin 12.0 - 15.0 g/dL 78.2  95.6  21.3   Hematocrit 36.0 - 46.0 % 44.7  45.6  39.6   Platelets 150 - 400 K/uL 276  283  221      Family Communication: I called and spoke with the patient's sister, who states that the patient has normal mentation at baseline but often is confused with her UTIs.   Disposition: Status is: Inpatient Remains inpatient appropriate because: she is receiving IV antibiotics for UTI. Patient also has acute metabolic encephalopathy.  Planned Discharge Destination: Skilled nursing facility    Time spent: 40 minutes  Author: MDALA-GAUSI, Gwenette Greet, MD 06/30/2023 12:49 PM  For on call review [email or web address].

## 2023-06-30 NOTE — Plan of Care (Signed)

## 2023-06-30 NOTE — Procedures (Addendum)
Patient was placed in left lateral decubitus position.  Digital rectal exam performed revealing large stool ball and impacted stool.  Lubricated gloved index and middle fingers were used to manually fragment and extract stool in stepwise manner.  No evidence of mucosal injury rectal bleeding or excessive patient discomfort.  Nurse Bonita Quin was with me throughout.  Abdominal x-ray was reviewed without any pathology suggested  Recs:   -Continue lactulose 20 g 3 times daily -Lubiprostone 24 mcg twice daily and upon discharge and also add Movantik 25 mg daily outpatient for chronic opioid use -Also given abnormal CT can discussed with patient outpatient colonoscopy   Please recall GI with any further questions

## 2023-07-01 LAB — BASIC METABOLIC PANEL
Anion gap: 10 (ref 5–15)
BUN: 8 mg/dL (ref 8–23)
CO2: 26 mmol/L (ref 22–32)
Calcium: 9.3 mg/dL (ref 8.9–10.3)
Chloride: 110 mmol/L (ref 98–111)
Creatinine, Ser: 0.52 mg/dL (ref 0.44–1.00)
GFR, Estimated: >60 mL/min (ref >60)
Glucose, Bld: 141 mg/dL — ABNORMAL HIGH (ref 70–99)
Potassium: 3.7 mmol/L (ref 3.5–5.1)
Sodium: 146 mmol/L — ABNORMAL HIGH (ref 135–145)

## 2023-07-01 LAB — GLUCOSE, CAPILLARY
Glucose-Capillary: 138 mg/dL — ABNORMAL HIGH (ref 70–99)
Glucose-Capillary: 137 mg/dL — ABNORMAL HIGH (ref 70–99)
Glucose-Capillary: 113 mg/dL — ABNORMAL HIGH (ref 70–99)
Glucose-Capillary: 157 mg/dL — ABNORMAL HIGH (ref 70–99)

## 2023-07-01 LAB — URINE CULTURE: Culture: >=100000 — AB

## 2023-07-01 NOTE — Progress Notes (Signed)
Progress Note   Patient: [name]:[id number] DOB: 04-14-1956 DOA: 06/28/2023     3 DOS: the patient was seen and examined on 07/01/2023   Brief hospital course: 68 year old woman with PMH of T2DM, DVT, bipolar disorder, HTN, stage II lung cancer, wheelchair dependent, frequent admissions, recurrent UTIs SNF resident who presented to the ED from SNF for abdominal pain, altered mental status.  Patient also had urinary retention.  Admitted with diagnosis of stercoral colitis and underwent manual disimpaction in the ED.  GI was consulted and made recommendations and changes to her bowel regimen.  Assessment and Plan:   Abdominal pain Due to constipation, stercoral colitis. Much improved after bowel movement. -Continue bowel regimen.  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 S/p manual disimpaction in the ED. GI consulted and noted constipation likely drug-induced as patient is on chronic opioids without an adequate bowel regimen. GI performed manual disimpaction at bedside 2/15. Per GI recs: - Lactulose 20 mg TID. - Continue lubiprostone 24 mcg twice daily - Movantik 25 mg daily. - Senokot as needed on discharge. - Needs outpatient colonoscopy  Proteus and ESBL E coli UTI.  H/o recurrent UTIs Urinary retention Per GI recurrent UTIs likely due to obstructive uropathy due to underlying severe constipation Catheter inserted at SNF. Urine cultures are growing Proteus and ESBL E. coli. -Continue with Flomax. -Continue meropenem given ESBL organism.   Acute metabolic encephalopathy Due to UTI. Patient is alert and orented x 3 at baseline, per sister.  -Will continue to reorient. -Continue treatment of UTI  Hypokalemia - Replete as needed.    Hypothyroidism -Does appear her methimazole was discontinued during recent admission for elevated TSH. TSH now 0.73, T4 1.03. - Will need to be rechecked in the  next few weeks. Methimazole may need to be resumed.   Diabetes mellitus 2, insulin-dependent, poorly controlled with hyperglycemia -most recent A1c 10.9 last admission - hold metformin - continue home basal insulin and insulin sliding scale   H/o DVT   -Continue with Eliquis   MDD (major depressive disorder), recurrent severe - Continue with home lithium, level within normal limit -Continue with Seroquel - home dose resumed given agitation.  -Resumed on lower dose gabapentin, increase to home dose once mentation back to baseline   Obesity, class I Body mass index is 34.93 kg/m.   DVT ppx: On systemic anticoagulation with Eliquis.      Subjective: Patient seems to be doing better mentally.  She knows she is in hospital.  She states she is not feeling well.  Complains of nausea.  Reports she has just had a bowel movement.  Physical Exam: Vitals:   06/29/23 2000 06/30/23 1324 06/30/23 2105 07/01/23 0649  BP: 115/62 (!) 129/97 (!) 142/95 (!) 103/58  Pulse:   98 74  Resp: 18 18 20 19   Temp: 98.1 F (36.7 C) 97.9 F (36.6 C) 99.2 F (37.3 C) 98.4 F (36.9 C)  TempSrc: Oral Oral Axillary Axillary  SpO2: 95% 95% 97% 93%  Weight:      Height:         General: Awake, oriented x 2 Eyes: Pupils equal, reactive  Oral cavity: moist mucous membranes  Head: Atraumatic, normocephalic  Neck: supple  Chest: clear to auscultation. No crackles, no wheezes  CVS: S1,S2 RRR. No murmurs  Abd: No distention, soft, non-tender. No masses palpable  Extr: No edema   MSK: No joint deformities or swelling  Neurological: Grossly intact.    Data Reviewed:     Latest Ref Rng & Units 07/01/2023    5:18 AM 06/30/2023    5:22 AM 06/29/2023    3:55 AM  BMP  Glucose 70 - 99 mg/dL 528  413  244   BUN 8 - 23 mg/dL 8  <5  6   Creatinine 0.10 - 1.00 mg/dL 2.72  5.36  6.44   Sodium 135 - 145 mmol/L 146  147  142   Potassium 3.5 - 5.1 mmol/L 3.7  3.4  3.6   Chloride 98 - 111 mmol/L 110  110  108    CO2 22 - 32 mmol/L 26  27  25    Calcium 8.9 - 10.3 mg/dL 9.3  9.9  9.1       Latest Ref Rng & Units 06/29/2023    3:55 AM 06/28/2023   11:58 AM 05/24/2023    4:06 AM  CBC  WBC 4.0 - 10.5 K/uL 6.3  8.5  5.9   Hemoglobin 12.0 - 15.0 g/dL 03.4  74.2  59.5   Hematocrit 36.0 - 46.0 % 44.7  45.6  39.6   Platelets 150 - 400 K/uL 276  283  221      Family Communication: I called and spoke with the patient's sister, who states that the patient has normal mentation at baseline but often is confused with her UTIs.   Disposition: Status is: Inpatient Remains inpatient appropriate because: she is receiving IV antibiotics for UTI. Patient also has acute metabolic encephalopathy.  Planned Discharge Destination: Skilled nursing facility    Time spent: 40 minutes  Author: MDALA-GAUSI, Gwenette Greet, MD 07/01/2023 11:34 AM  For on call review [email or web address].

## 2023-07-02 LAB — BASIC METABOLIC PANEL
Anion gap: 7 (ref 5–15)
BUN: 5 mg/dL — ABNORMAL LOW (ref 8–23)
CO2: 25 mmol/L (ref 22–32)
Calcium: 8.7 mg/dL — ABNORMAL LOW (ref 8.9–10.3)
Chloride: 108 mmol/L (ref 98–111)
Creatinine, Ser: 0.32 mg/dL — ABNORMAL LOW (ref 0.44–1.00)
GFR, Estimated: >60 mL/min (ref >60)
Glucose, Bld: 159 mg/dL — ABNORMAL HIGH (ref 70–99)
Potassium: 3 mmol/L — ABNORMAL LOW (ref 3.5–5.1)
Sodium: 140 mmol/L (ref 135–145)

## 2023-07-02 LAB — CBC
HCT: 41.2 % (ref 36.0–46.0)
Hemoglobin: 12.8 g/dL (ref 12.0–15.0)
MCH: 30.5 pg (ref 26.0–34.0)
MCHC: 31.1 g/dL (ref 30.0–36.0)
MCV: 98.3 fL (ref 80.0–100.0)
Platelets: 283 10*3/uL (ref 150–400)
RBC: 4.19 MIL/uL (ref 3.87–5.11)
RDW: 12.9 % (ref 11.5–15.5)
WBC: 6.7 10*3/uL (ref 4.0–10.5)
nRBC: 0 % (ref 0.0–0.2)

## 2023-07-02 LAB — GLUCOSE, CAPILLARY
Glucose-Capillary: 128 mg/dL — ABNORMAL HIGH (ref 70–99)
Glucose-Capillary: 145 mg/dL — ABNORMAL HIGH (ref 70–99)
Glucose-Capillary: 133 mg/dL — ABNORMAL HIGH (ref 70–99)
Glucose-Capillary: 137 mg/dL — ABNORMAL HIGH (ref 70–99)

## 2023-07-02 LAB — MRSA NEXT GEN BY PCR, NASAL: MRSA by PCR Next Gen: NOT DETECTED

## 2023-07-02 MED ORDER — GABAPENTIN 400 MG PO CAPS
400.0000 mg | ORAL_CAPSULE | Freq: Three times a day (TID) | ORAL | Status: DC
  Administered 2023-07-02 – 2023-07-05 (×9): 400 mg via ORAL
  Filled 2023-07-02 (×9): qty 1

## 2023-07-02 MED ORDER — KETOROLAC TROMETHAMINE 15 MG/ML IJ SOLN
15.0000 mg | Freq: Once | INTRAMUSCULAR | Status: AC
  Administered 2023-07-02: 15 mg via INTRAVENOUS
  Filled 2023-07-02: qty 1

## 2023-07-02 NOTE — Progress Notes (Signed)
OT Cancellation Note  Patient Details Name: [name] MRN: [id number] DOB: 1955-07-18   Cancelled Treatment:    Reason Eval/Treat Not Completed: OT screened, no needs identified, will sign off. Discussed patient with physical therapy who also had discussed the patient with social work. Pt is not in need of OT services due to baseline assist levels.   Arali Somera OT, MOT   Danie Chandler 07/02/2023, 12:09 PM

## 2023-07-02 NOTE — Plan of Care (Signed)
  Problem: Nutrition: Goal: Adequate nutrition will be maintained Outcome: Progressing   Problem: Safety: Goal: Ability to remain free from injury will improve Outcome: Progressing   

## 2023-07-02 NOTE — Progress Notes (Addendum)
Progress Note   Patient: [name] [id number]:[id number] DOB: 03-26-1956 DOA: 06/28/2023     4 DOS: the patient was seen and examined on 07/02/2023   Brief hospital course: 68 year old woman with PMH of T2DM, DVT, bipolar disorder, HTN, stage II lung cancer, wheelchair dependent, frequent admissions, recurrent UTIs SNF resident who presented to the ED from SNF for abdominal pain, altered mental status.  Patient also had urinary retention.  Admitted with diagnosis of stercoral colitis and underwent manual disimpaction in the ED.  GI was consulted and made recommendations and changes to her bowel regimen.  Assessment and Plan:   Abdominal pain Due to constipation, stercoral colitis. Much improved after bowel movement. -Continue bowel regimen.  Nausea and vomiting Improving - PRN Zofran  Stercoral colitis due to severe constipation. S/p manual disimpaction in the ED. GI consulted and noted constipation likely drug-induced as patient is on chronic opioids without an adequate bowel regimen. GI performed manual disimpaction at bedside 2/15. Per GI recs: - Lactulose 20 mg TID. - Continue lubiprostone 24 mcg twice daily - Movantik 25 mg daily. - Senokot as needed on discharge. - Needs outpatient colonoscopy  Proteus and ESBL E coli UTI.  H/o recurrent UTIs Urinary retention Per GI recurrent UTIs likely due to obstructive uropathy due to underlying severe constipation Catheter inserted at SNF. Urine cultures are growing Proteus and ESBL E. coli. -Continue with Flomax. -Continue meropenem given ESBL organism and plan to complete 7-day course while hospitalized. End date 2/20. - Attempt voiding trial in 1-2 days.    Acute metabolic encephalopathy, resolving Due to UTI. Patient is alert and orented x 3 at baseline, per sister.  Mental status is much improved.  -Will continue to reorient. -Continue treatment of UTI  Hypokalemia - Replete as needed.    Hypothyroidism -Does appear  her methimazole was discontinued during recent admission for elevated TSH. TSH now 0.73, T4 1.03. - Will need to be rechecked in the next few weeks. Methimazole may need to be resumed.   Diabetes mellitus 2, insulin-dependent, poorly controlled with hyperglycemia -most recent A1c 10.9 last admission - hold metformin - continue home basal insulin and insulin sliding scale   H/o DVT   -Continue with Eliquis   MDD (major depressive disorder), recurrent severe - Continue with home lithium, level within normal limit -Continue with Seroquel - home dose resumed given agitation.  -Resumed on lower dose gabapentin, increase to home dose once mentation back to baseline   Obesity, class I Body mass index is 34.93 kg/m.   DVT ppx: On systemic anticoagulation with Eliquis.      Subjective: Patient continues to improve mentally. We had a good conversation today. She still complains of nausea and was noted to be vomiting this morning.   Physical Exam: Vitals:   07/01/23 0649 07/01/23 1811 07/01/23 2105 07/02/23 1346  BP: (!) 103/58 (!) 151/82 (!) 136/92 106/75  Pulse: 74 85 88 100  Resp: 19 18 20 19   Temp: 98.4 F (36.9 C) 98.1 F (36.7 C) 98.2 F (36.8 C) 98.3 F (36.8 C)  TempSrc: Axillary Oral    SpO2: 93% 95% 94% 92%  Weight:      Height:        General: Awake, oriented x 3 Eyes: Pupils equal, reactive  Oral cavity: moist mucous membranes  Head: Atraumatic, normocephalic  Neck: supple  Chest: clear to auscultation. No crackles, no wheezes  CVS: S1,S2 RRR. No murmurs  Abd: No distention, soft, non-tender. No masses palpable  Extr: No edema   MSK: No joint deformities or swelling  Neurological: Grossly intact.    Data Reviewed:     Latest Ref Rng & Units 07/02/2023    4:46 AM 07/01/2023    5:18 AM 06/30/2023    5:22 AM  BMP  Glucose 70 - 99 mg/dL 528  413  244   BUN 8 - 23 mg/dL 5  8  <5   Creatinine 0.10 - 1.00 mg/dL 2.72  5.36  6.44   Sodium 135 - 145 mmol/L 140   146  147   Potassium 3.5 - 5.1 mmol/L 3.0  3.7  3.4   Chloride 98 - 111 mmol/L 108  110  110   CO2 22 - 32 mmol/L 25  26  27    Calcium 8.9 - 10.3 mg/dL 8.7  9.3  9.9       Latest Ref Rng & Units 07/02/2023    4:46 AM 06/29/2023    3:55 AM 06/28/2023   11:58 AM  CBC  WBC 4.0 - 10.5 K/uL 6.7  6.3  8.5   Hemoglobin 12.0 - 15.0 g/dL 03.4  74.2  59.5   Hematocrit 36.0 - 46.0 % 41.2  44.7  45.6   Platelets 150 - 400 K/uL 283  276  283      Family Communication: Patient's sister updated on patient's progress and plan of care.   Disposition: Status is: Inpatient Remains inpatient appropriate because: she is receiving IV antibiotics for ESBL UTI.   Planned Discharge Destination: Skilled nursing facility    Time spent: 40 minutes  Author: MDALA-GAUSI, Gwenette Greet, MD 07/02/2023 1:59 PM  For on call review [email or web address].

## 2023-07-03 DIAGNOSIS — K5289 Other specified noninfective gastroenteritis and colitis: Secondary | ICD-10-CM | POA: Diagnosis not present

## 2023-07-03 LAB — GLUCOSE, CAPILLARY
Glucose-Capillary: 139 mg/dL — ABNORMAL HIGH (ref 70–99)
Glucose-Capillary: 163 mg/dL — ABNORMAL HIGH (ref 70–99)
Glucose-Capillary: 160 mg/dL — ABNORMAL HIGH (ref 70–99)
Glucose-Capillary: 175 mg/dL — ABNORMAL HIGH (ref 70–99)

## 2023-07-03 LAB — CULTURE, BLOOD (ROUTINE X 2)
Culture: NO GROWTH
Culture: NO GROWTH

## 2023-07-03 LAB — BASIC METABOLIC PANEL
Anion gap: 7 (ref 5–15)
BUN: 6 mg/dL — ABNORMAL LOW (ref 8–23)
CO2: 24 mmol/L (ref 22–32)
Calcium: 8.6 mg/dL — ABNORMAL LOW (ref 8.9–10.3)
Chloride: 109 mmol/L (ref 98–111)
Creatinine, Ser: 0.43 mg/dL — ABNORMAL LOW (ref 0.44–1.00)
GFR, Estimated: >60 mL/min (ref >60)
Glucose, Bld: 151 mg/dL — ABNORMAL HIGH (ref 70–99)
Potassium: 3.8 mmol/L (ref 3.5–5.1)
Sodium: 140 mmol/L (ref 135–145)

## 2023-07-03 MED ORDER — INSULIN GLARGINE-YFGN 100 UNIT/ML ~~LOC~~ SOLN
15.0000 [IU] | Freq: Every day | SUBCUTANEOUS | Status: DC
  Administered 2023-07-03 – 2023-07-05 (×3): 15 [IU] via SUBCUTANEOUS
  Filled 2023-07-03 (×4): qty 0.15

## 2023-07-03 MED ORDER — SODIUM CHLORIDE 0.9 % IV SOLN: INTRAVENOUS | Status: AC | PRN

## 2023-07-03 MED ORDER — OXYCODONE-ACETAMINOPHEN 7.5-325 MG PO TABS
1.0000 | ORAL_TABLET | Freq: Four times a day (QID) | ORAL | Status: DC | PRN
  Administered 2023-07-03 – 2023-07-05 (×8): 1 via ORAL
  Filled 2023-07-03 (×8): qty 1

## 2023-07-03 NOTE — Plan of Care (Signed)

## 2023-07-03 NOTE — Progress Notes (Signed)
Patient urinated 500cc without difficulty.

## 2023-07-03 NOTE — Progress Notes (Addendum)
Progress Note Patient: [name] WGN:[id number] DOB: July 05, 1955 DOA: 06/28/2023     5 DOS: the patient was seen and examined on 07/03/2023   Brief hospital course: 68 year old woman with PMH of T2DM, DVT, bipolar disorder, HTN, stage II lung cancer, wheelchair dependent, frequent admissions, recurrent UTIs SNF resident who presented to the ED from SNF for abdominal pain, altered mental status.  Patient also had urinary retention.  Admitted with diagnosis of stercoral colitis and underwent manual disimpaction in the ED.  GI was consulted and made recommendations and changes to her bowel regimen.  Assessment and Plan:   Abdominal pain- improved. 2/2 chronic constipation, stercoral colitis related to opioid pain use.  -Continue bowel regimen.  Nausea and vomiting- Improving - PRN Zofran  Stercoral colitis due to severe constipation- last BM this am S/p manual disimpaction in the ED. GI consulted and noted constipation likely drug-induced as patient is on chronic opioids without an adequate bowel regimen. GI performed manual disimpaction at bedside 2/15. Per GI recs: - Lactulose 20 mg TID. - Continue lubiprostone 24 mcg twice daily - Movantik 25 mg daily. - Senokot as needed on discharge. - Needs outpatient colonoscopy  Proteus and ESBL E coli UTI.  Urinary retention-  recurrent UTIs likely due to obstructive uropathy due to underlying severe constipation Catheter inserted at SNF. Urine cultures are growing Proteus and ESBL E. coli. -Continue with Flomax. -Continue meropenem given ESBL organism and plan to complete 7-day course while hospitalized. End date 2/20. - Attempt voiding trial today  - remove foley, bladder scans q4 hrs until voids spontaneously   Acute metabolic encephalopathy-- resolved.  Due to UTI.  Hypokalemia- resolve. K+ 3.8 today - Replete as needed.    Hypothyroidism -Does appear her methimazole was discontinued during recent admission for elevated  TSH. TSH now 0.73, T4 1.03. - Will need to be rechecked in the next few weeks. Methimazole may need to be resumed.   Diabetes mellitus 2, insulin-dependent, poorly controlled with hyperglycemia -most recent A1c 10.9 last admission - hold metformin - continue home basal insulin and insulin sliding scale - moved insulin to am dosing for safer dosing   H/o DVT   -Continue with Eliquis   MDD (major depressive disorder), recurrent severe - Continue with home lithium, level within normal limit -Continue with Seroquel - home dose resumed given agitation.  -Resumed on lower dose gabapentin, increase to home dose once mentation back to baseline   Obesity, class I Body mass index is 34.93 kg/m.   DVT ppx: On systemic anticoagulation with Eliquis.      Subjective: Patient reports lower back pain. She is tearful and thinks the nurse is mad at her.   Physical Exam: Vitals:   07/01/23 2105 07/02/23 1346 07/02/23 2036 07/03/23 0450  BP: (!) 136/92 106/75 105/76 98/68  Pulse: 88 100 89 72  Resp: 20 19 18 18   Temp: 98.2 F (36.8 C) 98.3 F (36.8 C) 98.2 F (36.8 C) (!) 97.4 F (36.3 C)  TempSrc:    Oral  SpO2: 94% 92% 97% 95%  Weight:      Height:        General: Awake, oriented x 3 Eyes: Pupils equal, reactive  Oral cavity: moist mucous membranes  Head: Atraumatic, normocephalic  Neck: supple  Chest: clear to auscultation. No crackles, no wheezes  CVS: S1,S2 RRR. No murmurs  Abd: No distention, soft, non-tender. No masses palpable  Extr: No edema   MSK: No joint deformities or swelling  Neurological:  Grossly intact.    Data Reviewed:     Latest Ref Rng & Units 07/03/2023    3:41 AM 07/02/2023    4:46 AM 07/01/2023    5:18 AM  BMP  Glucose 70 - 99 mg/dL 098  119  147   BUN 8 - 23 mg/dL 6  5  8    Creatinine 0.44 - 1.00 mg/dL 8.29  5.62  1.30   Sodium 135 - 145 mmol/L 140  140  146   Potassium 3.5 - 5.1 mmol/L 3.8  3.0  3.7   Chloride 98 - 111 mmol/L 109  108  110    CO2 22 - 32 mmol/L 24  25  26    Calcium 8.9 - 10.3 mg/dL 8.6  8.7  9.3       Latest Ref Rng & Units 07/02/2023    4:46 AM 06/29/2023    3:55 AM 06/28/2023   11:58 AM  CBC  WBC 4.0 - 10.5 K/uL 6.7  6.3  8.5   Hemoglobin 12.0 - 15.0 g/dL 86.5  78.4  69.6   Hematocrit 36.0 - 46.0 % 41.2  44.7  45.6   Platelets 150 - 400 K/uL 283  276  283      Family Communication: none at bedside  Disposition: Status is: Inpatient Remains inpatient appropriate because: she is receiving IV antibiotics for ESBL UTI.   Planned Discharge Destination: Skilled nursing facility    Time spent: 40 minutes  Author: Leeroy Bock, MD 07/03/2023 8:18 AM  For on call review [email or web address].

## 2023-07-03 NOTE — Progress Notes (Signed)
Foley cathter removed at 12:15. Will monitor pt for urinary output.

## 2023-07-04 DIAGNOSIS — K5289 Other specified noninfective gastroenteritis and colitis: Secondary | ICD-10-CM | POA: Diagnosis not present

## 2023-07-04 LAB — BASIC METABOLIC PANEL
Anion gap: 6 (ref 5–15)
BUN: 9 mg/dL (ref 8–23)
CO2: 25 mmol/L (ref 22–32)
Calcium: 8.8 mg/dL — ABNORMAL LOW (ref 8.9–10.3)
Chloride: 111 mmol/L (ref 98–111)
Creatinine, Ser: 0.46 mg/dL (ref 0.44–1.00)
GFR, Estimated: >60 mL/min (ref >60)
Glucose, Bld: 132 mg/dL — ABNORMAL HIGH (ref 70–99)
Potassium: 4.2 mmol/L (ref 3.5–5.1)
Sodium: 142 mmol/L (ref 135–145)

## 2023-07-04 LAB — GLUCOSE, CAPILLARY
Glucose-Capillary: 141 mg/dL — ABNORMAL HIGH (ref 70–99)
Glucose-Capillary: 186 mg/dL — ABNORMAL HIGH (ref 70–99)
Glucose-Capillary: 149 mg/dL — ABNORMAL HIGH (ref 70–99)
Glucose-Capillary: 130 mg/dL — ABNORMAL HIGH (ref 70–99)

## 2023-07-04 MED ORDER — MAGIC MOUTHWASH W/LIDOCAINE
2.0000 mL | Freq: Three times a day (TID) | ORAL | Status: DC | PRN
Start: 2023-07-04 — End: 2023-07-05
  Filled 2023-07-04: qty 5

## 2023-07-04 NOTE — Progress Notes (Signed)
PROGRESS NOTE    [name]  JWJ:[id number] DOB: 07-11-1955 DOA: 06/28/2023 PCP: Patient, [name] Per  Chief Complaint  Patient presents with   Abdominal Pain    Hospital Course:  [name] is 68 y.o. female with diabetes, DVT, bipolar disorder, hypertension, stage II lung cancer, wheelchair dependent, frequent admissions, recurrent UTIs, [facility] resident, who presented to the ED from SNF for abdominal pain and altered mental status.  Patient was found to have urinary retention and stercoral colitis.  She underwent manual disimpaction in the ED.  GI was consulted.  Her abdominal pain improved.  Subjective: No acute events overnight.  She complains of a toothache this morning.  Reports her front teeth hurt all the time   Objective: Vitals:   07/03/23 0450 07/03/23 1457 07/03/23 2120 07/04/23 0518  BP: 98/68 102/70 (!) 100/56 97/61  Pulse: 72 89 88 66  Resp: 18 18 20 18   Temp: (!) 97.4 F (36.3 C) 97.8 F (36.6 C) 98.5 F (36.9 C) 98.2 F (36.8 C)  TempSrc: Oral Oral Oral   SpO2: 95% 97% 97% 97%  Weight:      Height:        Intake/Output Summary (Last 24 hours) at 07/04/2023 0741 Last data filed at 07/04/2023 0500 Gross per 24 hour  Intake 980.01 ml  Output 3550 ml  Net -2569.99 ml   Filed Weights   06/28/23 1123 06/29/23 0813  Weight: 107.3 kg 107 kg    Examination: General exam: Appears calm and comfortable, uncomfortable appearing Mouth: 4 dentition, multiple broken or missing teeth Respiratory system: No work of breathing, symmetric chest wall expansion Cardiovascular system: S1 & S2 heard, RRR.  Gastrointestinal system: Abdomen is nondistended, soft and nontender.  Neuro: Alert and oriented. No focal neurological deficits. Extremities: Symmetric, expected ROM Skin: No rashes, lesions Psychiatry: Demonstrates appropriate judgement and insight. Mood & affect appropriate for situation.   Assessment & Plan:  Principal Problem:   Stercoral  colitis Active Problems:   Acute urinary retention   Controlled type 2 diabetes mellitus with hyperglycemia (HCC)   DVT (deep venous thrombosis) 09/08/22   UTI due to extended-spectrum beta lactamase (ESBL) producing Escherichia coli   HTN (hypertension)    Abdominal pain Stercoral colitis - Chronic constipation secondary to opioid use - Continue with aggressive bowel regimen - Status post disimpaction in the ED - GI consulted - Continue with aggressive bowel regimen: Lactulose 20 mg 3 times daily, lubiprostone 24 mcg twice daily, Movantik 25 mg daily.  Senna as needed - Will need outpatient follow-up for colonoscopy  Proteus and ESBL E. coli UTI Urinary retention - Recurrent UTIs likely secondary to obstructive uropathy in the setting of severe constipation - Catheter was inserted at SNF - Urine cultures with ESBL E. coli and Proteus - Continue Flomax - Continue meropenem to complete 7-day course on 2/20 - Voiding trial in 2/18.  Now with purwick  Acute metabolic encephalopathy - Secondary to UTI, resolved now  Hypokalemia, resolved - Replace as needed  Hyperthyroidism - Appears patient was previously on methimazole which was discontinued during recent admission due to elevated TSH.  This admission TSH 0.73, T4  1.03 - Follow-up outpatient, recheck TSH.  May need to resume methimazole  Diabetes mellitus type 2, insulin-dependent, poorly controlled with hyperglycemia - Most recent hemoglobin A1c 10.9% - Hold metformin while admitted, resume at discharge - Continue with basal/bolus insulin, sliding scale insulin, titrate up as tolerated  History of DVT - Continue home  dose Eliquis  Major depressive disorder, recurrent severe - Continue home dose lithium, levels within normal limits - Continue Seroquel home dose - Takes gabapentin at home, this has been resumed at a lower dose given somnolence.  Consider home dose when mentation is back to baseline  Class I  obesity BMI 34 - Outpatient follow up for lifestyle modification and risk factor management  Left kidney cyst - Incidentally seen on CT - This is chronic, longstanding 2.9 cm hyperattenuating lesion in the medial aspect of left kidney.  Requires no specific follow-up.  Hiatal hernia - Incidentally seen on CT - Small, unchanged.  Poor dentition - Needs close outpatient follow-up with dentist - Magic mouthwash for now  DVT prophylaxis: Eliquis   Code Status: Limited: Do not attempt resuscitation (DNR) -DNR-LIMITED -Do Not Intubate/DNI  Family Communication:  Discussed directly with patient Disposition:  Inpatient still hospitalized for IV Abx, will discharge to SNF when IV Abx are completed. TOC has been consulted for arrangements  Consultants:    Procedures:    Antimicrobials:  Anti-infectives (From admission, onward)    Start     Dose/Rate Route Frequency Ordered Stop   06/28/23 2200  piperacillin-tazobactam (ZOSYN) IVPB 3.375 g  Status:  Discontinued        3.375 g 12.5 mL/hr over 240 Minutes Intravenous Every 8 hours 06/28/23 1650 06/28/23 1712   06/28/23 1800  meropenem (MERREM) 1 g in sodium chloride 0.9 % 100 mL IVPB        1 g 200 mL/hr over 30 Minutes Intravenous Every 8 hours 06/28/23 1712     06/28/23 1615  piperacillin-tazobactam (ZOSYN) IVPB 3.375 g        3.375 g 100 mL/hr over 30 Minutes Intravenous  Once 06/28/23 1609 06/28/23 1723       Data Reviewed: I have personally reviewed following labs and imaging studies CBC: Recent Labs  Lab 06/28/23 1158 06/29/23 0355 07/02/23 0446  WBC 8.5 6.3 6.7  NEUTROABS 6.9  --   --   HGB 14.4 14.1 12.8  HCT 45.6 44.7 41.2  MCV 93.6 96.5 98.3  PLT 283 276 283   Basic Metabolic Panel: Recent Labs  Lab 06/28/23 1158 06/29/23 0355 06/30/23 0522 07/01/23 0518 07/02/23 0446 07/03/23 0341 07/04/23 0349  NA 136   < > 147* 146* 140 140 142  K 2.6*   < > 3.4* 3.7 3.0* 3.8 4.2  CL 92*   < > 110 110 108 109 111   CO2 32   < > 27 26 25 24 25   GLUCOSE 209*   < > 185* 141* 159* 151* 132*  BUN 8   < > <5* 8 5* 6* 9  CREATININE 0.51   < > 0.42* 0.52 0.32* 0.43* 0.46  CALCIUM 9.8   < > 9.9 9.3 8.7* 8.6* 8.8*  MG 2.4  --   --   --   --   --   --    < > = values in this interval not displayed.   GFR: Estimated Creatinine Clearance: 90.1 mL/min (by C-G formula based on SCr of 0.46 mg/dL). Liver Function Tests: Recent Labs  Lab 06/28/23 1158  AST 17  ALT 15  ALKPHOS 121  BILITOT 0.7  PROT 7.4  ALBUMIN 3.6   CBG: Recent Labs  Lab 07/02/23 2031 07/03/23 0718 07/03/23 1158 07/03/23 1642 07/03/23 2117  GLUCAP 137* 139* 163* 160* 175*    Recent Results (from the past 240 hours)  Urine Culture  Status: Abnormal   Collection Time: 06/28/23  2:35 PM   Specimen: Urine, Catheterized  Result Value Ref Range Status   Specimen Description   Final    URINE, CATHETERIZED Performed at [facility], [address] [place], [place] [address]    Special Requests   Final    NONE Performed at [facility], [address] [place], [place] [address]    Culture (A)  Final    >=100,000 COLONIES/mL ESCHERICHIA COLI >=100,000 COLONIES/mL PROTEUS MIRABILIS Confirmed Extended Spectrum Beta-Lactamase Producer (ESBL).  In bloodstream infections from ESBL organisms, carbapenems are preferred over piperacillin/tazobactam. They are shown to have a lower risk of mortality.    Report Status 07/01/2023 FINAL  Final   Organism ID, Bacteria ESCHERICHIA COLI (A)  Final   Organism ID, Bacteria PROTEUS MIRABILIS (A)  Final      Susceptibility   Escherichia coli - MIC*    AMPICILLIN >=32 RESISTANT Resistant     CEFAZOLIN >=64 RESISTANT Resistant     CEFEPIME 16 RESISTANT Resistant     CEFTRIAXONE >=64 RESISTANT Resistant     CIPROFLOXACIN <=0.25 SENSITIVE Sensitive     GENTAMICIN <=1 SENSITIVE Sensitive     IMIPENEM <=0.25 SENSITIVE Sensitive     NITROFURANTOIN <=16 SENSITIVE Sensitive     TRIMETH/SULFA <=20  SENSITIVE Sensitive     AMPICILLIN/SULBACTAM 4 SENSITIVE Sensitive     PIP/TAZO <=4 SENSITIVE Sensitive ug/mL    * >=100,000 COLONIES/mL ESCHERICHIA COLI   Proteus mirabilis - MIC*    AMPICILLIN <=2 SENSITIVE Sensitive     CEFAZOLIN <=4 SENSITIVE Sensitive     CEFEPIME <=0.12 SENSITIVE Sensitive     CEFTRIAXONE <=0.25 SENSITIVE Sensitive     CIPROFLOXACIN <=0.25 SENSITIVE Sensitive     GENTAMICIN <=1 SENSITIVE Sensitive     IMIPENEM 2 SENSITIVE Sensitive     NITROFURANTOIN 128 RESISTANT Resistant     TRIMETH/SULFA <=20 SENSITIVE Sensitive     AMPICILLIN/SULBACTAM <=2 SENSITIVE Sensitive     PIP/TAZO <=4 SENSITIVE Sensitive ug/mL    * >=100,000 COLONIES/mL PROTEUS MIRABILIS  Blood culture (routine x 2)     Status: None   Collection Time: 06/28/23  4:35 PM   Specimen: BLOOD  Result Value Ref Range Status   Specimen Description BLOOD BLOOD RIGHT HAND  Final   Special Requests   Final    BOTTLES DRAWN AEROBIC ONLY Blood Culture results may not be optimal due to an inadequate volume of blood received in culture bottles   Culture   Final    NO GROWTH 5 DAYS Performed at [facility], [address] [place], [place] [address]    Report Status 07/03/2023 FINAL  Final  Blood culture (routine x 2)     Status: None   Collection Time: 06/28/23  4:35 PM   Specimen: BLOOD  Result Value Ref Range Status   Specimen Description BLOOD BLOOD LEFT HAND  Final   Special Requests   Final    BOTTLES DRAWN AEROBIC ONLY Blood Culture results may not be optimal due to an inadequate volume of blood received in culture bottles   Culture   Final    NO GROWTH 5 DAYS Performed at [facility], [address], [place], [place] [address]    Report Status 07/03/2023 FINAL  Final  MRSA Next Gen by PCR, Nasal     Status: None   Collection Time: 07/02/23  1:19 PM   Specimen: Nasal Mucosa; Nasal Swab  Result Value Ref Range Status   MRSA  by PCR Next Gen NOT DETECTED NOT DETECTED Final    Comment: (NOTE) The  GeneXpert MRSA Assay (FDA approved for NASAL specimens only), is one component of a comprehensive MRSA colonization surveillance program. It is not intended to diagnose MRSA infection nor to guide or monitor treatment for MRSA infections. Test performance is not FDA approved in patients less than 36 years old. Performed at [facility], [address], [place], [place] [address]      Radiology Studies: No results found.  Scheduled Meds:  apixaban  5 mg Oral BID   ascorbic acid  500 mg Oral BID   folic acid  1 mg Oral Daily   gabapentin  400 mg Oral TID   insulin aspart  0-15 Units Subcutaneous TID WC   insulin glargine-yfgn  15 Units Subcutaneous Daily   lactulose  20 g Oral TID   lithium carbonate  300 mg Oral QHS   lithium  600 mg Oral Daily   lubiprostone  24 mcg Oral BID WC   melatonin  9 mg Oral QHS   naloxegol oxalate  25 mg Oral Daily   pantoprazole (PROTONIX) IV  40 mg Intravenous Q24H   polyethylene glycol-electrolytes  4,000 mL Oral Once   QUEtiapine  300 mg Oral QHS   QUEtiapine  50 mg Oral Daily   tamsulosin  0.4 mg Oral Daily   Continuous Infusions:  sodium chloride 10 mL/hr at 07/03/23 0925   meropenem (MERREM) IV 1 g (07/04/23 0235)     LOS: 6 days    Total time spent coordinating care:   Debarah Crape, DO [facility]  To contact the attending physician between 7A-7P please use Epic Chat. To contact the covering physician during after hours 7P-7A, please review Amion.   07/04/2023, 7:41 AM   *This document has been created with the assistance of dictation software. Please excuse typographical errors. *

## 2023-07-04 NOTE — Plan of Care (Signed)

## 2023-07-04 NOTE — Progress Notes (Signed)
Pt assisted up OOB into recliner using hoyer life. Pt states appreciation, states, "I've been so tired of laying in that bed. Sitting up makes me feel like a real person." Chair alarm on for safety. Call bell within reach. Advised pt to call for needs. Pt states pain in legs and back is much better since oral pain meds admin'd and she is up in chair.

## 2023-07-04 NOTE — TOC Progression Note (Signed)
Transition of Care [facility]) - Progression Note    Patient Details  Name: [name] MRN: [id number] Date of Birth: [birth year]  Transition of [facility]) CM/SW Contact  Elliot Gault, LCSW Phone Number: 07/04/2023, 10:30 AM  Clinical Narrative:     TOC following. Per MD, pt will be stable for dc back to SNF tomorrow. Updated Debbie at [facility]  Will follow up in AM.  Expected Discharge Plan: Long Term Nursing Home Barriers to Discharge: Continued Medical Work up  Expected Discharge Plan and Services In-house Referral: Clinical Social Work     Living arrangements for the past 2 months: Skilled Nursing Facility                                       Social Determinants of Health (SDOH) Interventions SDOH Screenings   Food Insecurity: No Food Insecurity (06/28/2023)  Housing: Low Risk  (06/28/2023)  Transportation Needs: No Transportation Needs (06/28/2023)  Utilities: Not At Risk (06/28/2023)  Alcohol Screen: Low Risk  (11/14/2017)  Social Connections: Socially Isolated (06/28/2023)  Tobacco Use: Medium Risk (06/28/2023)    Readmission Risk Interventions    06/29/2023   10:37 AM 05/24/2023    4:41 PM 05/22/2023    2:07 PM  Readmission Risk Prevention Plan  Transportation Screening Complete Complete Complete  Medication Review Oceanographer) Complete Complete Complete  PCP or Specialist appointment within 3-5 days of discharge   Complete  HRI or Home Care Consult Complete Complete Complete  SW Recovery Care/Counseling Consult Complete Complete Complete  Palliative Care Screening Not Applicable Not Applicable Not Applicable  Skilled Nursing Facility Complete Complete Complete

## 2023-07-05 DIAGNOSIS — K5289 Other specified noninfective gastroenteritis and colitis: Secondary | ICD-10-CM | POA: Diagnosis not present

## 2023-07-05 LAB — GLUCOSE, CAPILLARY
Glucose-Capillary: 154 mg/dL — ABNORMAL HIGH (ref 70–99)
Glucose-Capillary: 176 mg/dL — ABNORMAL HIGH (ref 70–99)

## 2023-07-05 MED ORDER — NALOXEGOL OXALATE 25 MG PO TABS: 25.0000 mg | ORAL_TABLET | Freq: Every day | ORAL | 0 refills | Status: AC

## 2023-07-05 MED ORDER — LACTULOSE 10 GM/15ML PO SOLN: 20.0000 g | Freq: Three times a day (TID) | ORAL | Status: DC

## 2023-07-05 MED ORDER — GABAPENTIN 400 MG PO CAPS: 400.0000 mg | ORAL_CAPSULE | Freq: Three times a day (TID) | ORAL | 0 refills | Status: DC

## 2023-07-05 NOTE — Progress Notes (Signed)
Patient discharged to [facility] skilled nursing facility,report called and given to Ferdinand Cava LPN.IV discontinued,catheter intact. [facility] to transport patient to awaiting facility.

## 2023-07-05 NOTE — Discharge Summary (Signed)
Physician Discharge Summary   Patient: [name] MRN: [id number] DOB: [birth year]  Admit date:     06/28/2023  Discharge date: 07/05/23  Discharge Physician: Debarah Crape   PCP: Patient, [name] Per   Recommendations at discharge:   Follow up with GI Follow up with dentist Follow up with primary care for chronic condition management  Discharge Diagnoses: Principal Problem:   Stercoral colitis Active Problems:   Acute urinary retention   Controlled type 2 diabetes mellitus with hyperglycemia (HCC)   DVT (deep venous thrombosis) 09/08/22   UTI due to extended-spectrum beta lactamase (ESBL) producing Escherichia coli   HTN (hypertension)  Resolved Problems:   * No resolved hospital problems. [facility] Course: [name] is a 68 year old female with diabetes, prior DVT, bipolar disorder, hypertension, stage II lung cancer, wheelchair dependent, frequent admissions for recurrent UTIs, nursing facility resident, who presents to the ED from her SNF with abdominal pain and altered mental status.  She was found to have urinary retention and stercoral colitis.  She underwent manual disimpaction via GI in the ED.  Her abdominal pain improved.  She was started on aggressive bowel regimen including lactulose, lubiprostone, Movantik, and as needed senna.  She will need close outpatient follow-up for colonoscopy. She was also found to have Proteus and ESBL E. coli UTI.  Recurrent UTIs are likely secondary to obstructive uropathy in the setting of severe constipation.  Catheter was inserted at skilled nursing facility but she passed trial of void here and did well with purwick only.  She completed a 7-day course of meropenem.  She will continue with Flomax. She has returned to her physiologic baseline and will discharge back to skilled nursing facility today.  TOC has been consulted to assist in these arrangements.   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  Senna as needed - Will need outpatient follow-up for colonoscopy   Proteus and ESBL E. coli UTI Urinary retention - Recurrent UTIs likely secondary to obstructive uropathy in the setting of severe constipation - Catheter was inserted at SNF - Urine cultures with ESBL E. coli and Proteus - Continue Flomax - Meropenem to complete 7-day course on 2/20 - Voiding trial on 2/18.  Now with purwick and doing well    Acute metabolic encephalopathy - Secondary to UTI, resolved now   Hypokalemia, resolved - Replace as needed   Hyperthyroidism - Appears patient was previously on methimazole which was discontinued during recent admission due to elevated TSH.  This admission TSH 0.73, T4  1.03 - Follow-up outpatient, recheck TSH.  May need to resume methimazole   Diabetes mellitus type 2, insulin-dependent, poorly controlled with hyperglycemia - Most recent hemoglobin A1c 10.9% - Hold metformin while admitted, resume at discharge - Continue with basal/bolus insulin, sliding scale insulin, titrate up as tolerated   History of DVT - Continue home dose Eliquis   Major depressive disorder, recurrent severe - Continue home dose lithium, levels within normal limits - Continue Seroquel home dose - Takes gabapentin at home, resume home dose    Class I obesity BMI 34 - Outpatient follow up for lifestyle modification and risk factor management   Left kidney cyst - Incidentally seen on CT - This is chronic, longstanding 2.9 cm  hyperattenuating lesion in the medial aspect of left kidney.  Requires no specific follow-up.   Hiatal hernia - Incidentally seen on CT - Small, unchanged.   Poor dentition - Needs close outpatient follow-up with dentist - Magic  mouthwash for now          Consultants: Gastroenterology  Procedures performed: Manual disimpaction  Disposition: SNF Diet recommendation:  Discharge Diet Orders (From admission, onward)     Start     Ordered   07/05/23 0000  Diet general        07/05/23 1054           Cardiac diet DISCHARGE MEDICATION: Allergies as of 07/05/2023       Reactions   Sulfa Antibiotics Swelling, Rash   Neck swelling        Medication List     STOP taking these medications    tiZANidine 2 MG tablet Commonly known as: ZANAFLEX   torsemide 20 MG tablet Commonly known as: DEMADEX       TAKE these medications    acetaminophen 325 MG tablet Commonly known as: TYLENOL Take 650 mg by mouth every 4 (four) hours as needed for mild pain.   ascorbic acid 500 MG tablet Commonly known as: VITAMIN C Take 500 mg by mouth 2 (two) times daily.   Basaglar KwikPen 100 UNIT/ML Inject 10 Units into the skin daily.   bisacodyl 10 MG suppository Commonly known as: DULCOLAX Place 10 mg rectally as needed for moderate constipation.   calcium carbonate 500 MG chewable tablet Commonly known as: TUMS - dosed in mg elemental calcium Chew 1 tablet by mouth every 5 (five) hours as needed for indigestion or heartburn.   cetirizine 10 MG tablet Commonly known as: ZYRTEC Take 10 mg by mouth daily. For allergies   Eliquis 5 MG Tabs tablet Generic drug: apixaban Take 5 mg by mouth 2 (two) times daily.   Fiasp FlexTouch 100 UNIT/ML FlexTouch Pen Generic drug: insulin aspart Inject 0-10 Units into the skin 4 (four) times daily -  before meals and at bedtime. Short acting insulin (Aspart) per sliding scale 0-10 ---:-- Insulin injection 0-10 Units 0-10 Units Subcutaneous, 3 times daily with meals CBG 70 - 120: 0 unit CBG 121 - 150: 0 unit  CBG 151 - 200: 1 unit CBG 201 - 250: 2 units CBG 251 - 300: 4 units CBG 301 - 350: 6 units  CBG 351 - 400: 8 units  CBG > 400: 10 units   folic acid 1 MG  tablet Commonly known as: FOLVITE Take 1 mg by mouth daily.   gabapentin 400 MG capsule Commonly known as: NEURONTIN Take 1 capsule (400 mg total) by mouth 3 (three) times daily for 17 days.   LACTOBACILLUS PO Take 1 capsule by mouth daily.   lactulose 10 GM/15ML solution Commonly known as: CHRONULAC Take 10 g by mouth daily as needed for moderate constipation. What changed: Another medication with the same name was changed. Make sure you understand how and when to take each.   lactulose 10 GM/15ML solution Commonly known as: CHRONULAC Take 30 mLs (20 g total) by mouth 3 (three) times daily. What changed: when to take this   lidocaine 4 % Place 1 patch onto the skin daily. Remove & Discard patch within 12 hours or as directed by MD; Apply to LOWER LEG Listed as Asperflex Pain Relieving Patch 4% on MAR   lithium carbonate 300 MG capsule Take 300 mg by mouth at bedtime.  lithium 600 MG capsule Take 600 mg by mouth daily.   loperamide 2 MG tablet Commonly known as: IMODIUM A-D Take 2 mg by mouth every 6 (six) hours as needed for diarrhea or loose stools.   lubiprostone 24 MCG capsule Commonly known as: AMITIZA Take 24 mcg by mouth 2 (two) times daily with a meal.   Melatonin 10 MG Tabs Take 10 mg by mouth at bedtime.   metFORMIN 500 MG tablet Commonly known as: GLUCOPHAGE Take 1 pill a day What changed:  how much to take how to take this when to take this   MULTIVITAMIN ADULT PO Take 1 tablet by mouth daily.   naloxegol oxalate 25 MG Tabs tablet Commonly known as: MOVANTIK Take 1 tablet (25 mg total) by mouth daily.   ondansetron 4 MG tablet Commonly known as: ZOFRAN Take 4 mg by mouth every 8 (eight) hours as needed for nausea or vomiting.   oxyCODONE-acetaminophen 10-325 MG tablet Commonly known as: PERCOCET Take 1 tablet by mouth every 6 (six) hours as needed for pain. What changed: when to take this   phenol 1.4 % Liqd Commonly known as:  CHLORASEPTIC Use as directed 5 sprays in the mouth or throat every 2 (two) hours as needed for throat irritation / pain.   polyethylene glycol 17 g packet Commonly known as: MIRALAX / GLYCOLAX Take 17 g by mouth 2 (two) times daily.   PRESCRIPTION MEDICATION Apply 1 Application topically in the morning, at noon, in the evening, and at bedtime. Muscle Rub External Cream 10-15% (Menthol-Methyl Salicylate (Liniments))  Apply to feet for pain   QUEtiapine 50 MG tablet Commonly known as: SEROQUEL Take 50 mg by mouth daily.   QUEtiapine 300 MG tablet Commonly known as: SEROQUEL Take 300 mg by mouth at bedtime.   tamsulosin 0.4 MG Caps capsule Commonly known as: FLOMAX Take 0.4 mg by mouth daily.        Discharge Exam: Filed Weights   06/28/23 1123 06/29/23 0813  Weight: 107.3 kg 107 kg   General exam: Appears calm and comfortable, uncomfortable appearing Mouth: poor dentition, multiple broken or missing teeth Respiratory system: No work of breathing, symmetric chest wall expansion Cardiovascular system: S1 & S2 heard, RRR.  Gastrointestinal system: Abdomen is nondistended, soft and nontender.  Neuro: Alert and oriented. No focal neurological deficits. Extremities: Symmetric, expected ROM Skin: No rashes, lesions Psychiatry: Depressed Mood & affect congruent.   Condition at discharge: stable  The results of significant diagnostics from this hospitalization (including imaging, microbiology, ancillary and laboratory) are listed below for reference.   Imaging Studies: DG Abd Portable 1V Result Date: 06/30/2023 CLINICAL DATA:  Abdominal pain, nausea and vomiting. EXAM: PORTABLE ABDOMEN - 1 VIEW COMPARISON:  05/04/2023 FINDINGS: Normal bowel-gas pattern. Normal amount of stool. The bones appear osteopenic. IMPRESSION: No acute abnormality. Electronically Signed   By: Beckie Salts M.D.   On: 06/30/2023 12:15   CT ABDOMEN PELVIS W CONTRAST Result Date: 06/28/2023 CLINICAL DATA:   Abdominal pain and distention. Concern for bowel obstruction. EXAM: CT ABDOMEN AND PELVIS WITH CONTRAST TECHNIQUE: Multidetector CT imaging of the abdomen and pelvis was performed using the standard protocol following bolus administration of intravenous contrast. RADIATION DOSE REDUCTION: This exam was performed according to the departmental dose-optimization program which includes automated exposure control, adjustment of the mA and/or kV according to patient size and/or use of iterative reconstruction technique. CONTRAST:  OMNIPAQUE IOHEXOL 300 MG/ML  SOLN COMPARISON:  CTA chest, abdomen, and pelvis dated  05/01/2023. FINDINGS: Lower chest: Mild left basilar atelectasis. Hepatobiliary: Hepatomegaly. No focal liver abnormality is seen. No gallstones, gallbladder wall thickening, or biliary dilatation. Pancreas: Unremarkable. No pancreatic ductal dilatation or surrounding inflammatory changes. Spleen: Normal in size without focal abnormality. Adrenals/Urinary Tract: Adrenal glands are unremarkable. Stable longstanding 2.9 cm hyperattenuating lesion along the medial aspect of the left kidney, which requires no specific follow-up imaging. Stable nonobstructive punctate left renal calculus. The right kidney is unremarkable. No hydronephrosis. Bladder is decompressed with Foley catheter in place. Stomach/Bowel: Small hiatal hernia, unchanged. Small bowel is grossly unremarkable. No evidence of obstruction. Large volume of stool throughout the colon, most pronounced in the rectum, which is dilated with a large stool ball measuring up to 9.8 cm in diameter. There is mild circumferential rectal wall thickening and stranding. Vascular/Lymphatic: The abdominal aorta is normal in caliber with atherosclerotic calcification. No enlarged abdominal or pelvic lymph nodes. Reproductive: Atrophic uterus.  No adnexal masses. Other: No abdominopelvic ascites. No intraperitoneal free air. No abdominal wall hernia. Musculoskeletal:  No acute osseous abnormality. No suspicious osseous lesion. 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 IMPRESSION: 1. Large volume of stool throughout the colon. The rectum is dilated with a large stool ball measuring up to 9.8 cm in diameter with mild circumferential wall thickening and stranding, concerning for stercoral colitis. 2. No evidence of obstruction. 3. Additional unchanged chronic findings, as described above. Aortic Atherosclerosis (ICD10-I70.0). Electronically Signed   By: Hart Robinsons M.D.   On: 06/28/2023 16:04    Microbiology: Results for orders placed or performed during the hospital encounter of 06/28/23  Urine Culture     Status: Abnormal   Collection Time: 06/28/23  2:35 PM   Specimen: Urine, Catheterized  Result Value Ref Range Status   Specimen Description   Final    URINE, CATHETERIZED Performed at [facility], [address], [place], [place] [address]    Special Requests   Final    NONE Performed at [facility], [address], [place], [place] [address]    Culture (A)  Final    >=100,000 COLONIES/mL ESCHERICHIA COLI >=100,000 COLONIES/mL PROTEUS MIRABILIS Confirmed Extended Spectrum Beta-Lactamase Producer (ESBL).  In bloodstream infections from ESBL organisms, carbapenems are preferred over piperacillin/tazobactam. They are shown to have a lower risk of mortality.    Report Status 07/01/2023 FINAL  Final   Organism ID, Bacteria ESCHERICHIA COLI (A)  Final   Organism ID, Bacteria PROTEUS MIRABILIS (A)  Final      Susceptibility   Escherichia coli - MIC*    AMPICILLIN >=32 RESISTANT Resistant     CEFAZOLIN >=64 RESISTANT Resistant     CEFEPIME 16 RESISTANT Resistant     CEFTRIAXONE >=64 RESISTANT Resistant     CIPROFLOXACIN <=0.25 SENSITIVE Sensitive     GENTAMICIN <=1 SENSITIVE Sensitive     IMIPENEM <=0.25 SENSITIVE Sensitive     NITROFURANTOIN <=16 SENSITIVE Sensitive     TRIMETH/SULFA <=20 SENSITIVE Sensitive     AMPICILLIN/SULBACTAM 4  SENSITIVE Sensitive     PIP/TAZO <=4 SENSITIVE Sensitive ug/mL    * >=100,000 COLONIES/mL ESCHERICHIA COLI   Proteus mirabilis - MIC*    AMPICILLIN <=2 SENSITIVE Sensitive     CEFAZOLIN <=4 SENSITIVE Sensitive     CEFEPIME <=0.12 SENSITIVE Sensitive     CEFTRIAXONE <=0.25 SENSITIVE Sensitive     CIPROFLOXACIN <=0.25 SENSITIVE Sensitive     GENTAMICIN <=1 SENSITIVE Sensitive     IMIPENEM 2 SENSITIVE Sensitive     NITROFURANTOIN  128 RESISTANT Resistant     TRIMETH/SULFA <=20 SENSITIVE Sensitive     AMPICILLIN/SULBACTAM <=2 SENSITIVE Sensitive     PIP/TAZO <=4 SENSITIVE Sensitive ug/mL    * >=100,000 COLONIES/mL PROTEUS MIRABILIS  Blood culture (routine x 2)     Status: None   Collection Time: 06/28/23  4:35 PM   Specimen: BLOOD  Result Value Ref Range Status   Specimen Description BLOOD BLOOD RIGHT HAND  Final   Special Requests   Final    BOTTLES DRAWN AEROBIC ONLY Blood Culture results may not be optimal due to an inadequate volume of blood received in culture bottles   Culture   Final    NO GROWTH 5 DAYS Performed at [facility], [address], [place], [place] [address]    Report Status 07/03/2023 FINAL  Final  Blood culture (routine x 2)     Status: None   Collection Time: 06/28/23  4:35 PM   Specimen: BLOOD  Result Value Ref Range Status   Specimen Description BLOOD BLOOD LEFT HAND  Final   Special Requests   Final    BOTTLES DRAWN AEROBIC ONLY Blood Culture results may not be optimal due to an inadequate volume of blood received in culture bottles   Culture   Final    NO GROWTH 5 DAYS Performed at [facility], [address], [place], [place] [address]    Report Status 07/03/2023 FINAL  Final  MRSA Next Gen by PCR, Nasal     Status: None   Collection Time: 07/02/23  1:19 PM   Specimen: Nasal Mucosa; Nasal Swab  Result Value Ref Range Status   MRSA by PCR Next Gen NOT DETECTED NOT DETECTED Final    Comment: (NOTE) The GeneXpert MRSA Assay (FDA approved for NASAL  specimens only), is one component of a comprehensive MRSA colonization surveillance program. It is not intended to diagnose MRSA infection nor to guide or monitor treatment for MRSA infections. Test performance is not FDA approved in patients less than 5 years old. Performed at [facility], [address] [place], [place] [address]     Labs: CBC: Recent Labs  Lab 06/28/23 1158 06/29/23 0355 07/02/23 0446  WBC 8.5 6.3 6.7  NEUTROABS 6.9  --   --   HGB 14.4 14.1 12.8  HCT 45.6 44.7 41.2  MCV 93.6 96.5 98.3  PLT 283 276 283   Basic Metabolic Panel: Recent Labs  Lab 06/28/23 1158 06/29/23 0355 06/30/23 0522 07/01/23 0518 07/02/23 0446 07/03/23 0341 07/04/23 0349  NA 136   < > 147* 146* 140 140 142  K 2.6*   < > 3.4* 3.7 3.0* 3.8 4.2  CL 92*   < > 110 110 108 109 111  CO2 32   < > 27 26 25 24 25   GLUCOSE 209*   < > 185* 141* 159* 151* 132*  BUN 8   < > <5* 8 5* 6* 9  CREATININE 0.51   < > 0.42* 0.52 0.32* 0.43* 0.46  CALCIUM 9.8   < > 9.9 9.3 8.7* 8.6* 8.8*  MG 2.4  --   --   --   --   --   --    < > = values in this interval not displayed.   Liver Function Tests: Recent Labs  Lab 06/28/23 1158  AST 17  ALT 15  ALKPHOS 121  BILITOT 0.7  PROT 7.4  ALBUMIN 3.6   CBG: Recent Labs  Lab 07/04/23 0749 07/04/23 1151  07/04/23 1630 07/04/23 2054 07/05/23 0750  GLUCAP 141* 186* 149* 130* 154*    Discharge time spent: greater than 30 minutes.  Signed: Debarah Crape, DO [facility] 07/05/2023

## 2023-07-05 NOTE — Hospital Course (Addendum)
Ms. Aleshire is a 68 year old female with diabetes, prior DVT, bipolar disorder, hypertension, stage II lung cancer, wheelchair dependent, frequent admissions for recurrent UTIs, nursing facility resident, who presents to the ED from her SNF with abdominal pain and altered mental status.  She was found to have urinary retention and stercoral colitis.  She underwent manual disimpaction via GI in the ED.  Her abdominal pain improved.  She was started on aggressive bowel regimen including lactulose, lubiprostone, Movantik, and as needed senna.  She will need close outpatient follow-up for colonoscopy. She was also found to have Proteus and ESBL E. coli UTI.  Recurrent UTIs are likely secondary to obstructive uropathy in the setting of severe constipation.  Catheter was inserted at skilled nursing facility but she passed trial of void here and did well with purwick only.  She completed a 7-day course of meropenem.  She will continue with Flomax. She has returned to her physiologic baseline and will discharge back to skilled nursing facility today.  TOC has been consulted to assist in these arrangements.   Abdominal pain Stercoral colitis - Chronic constipation secondary to opioid use - Continue with aggressive bowel regimen - Status post disimpaction in the ED - GI consulted - Continue with aggressive bowel regimen: Lactulose 20 mg 3 times daily, lubiprostone 24 mcg twice daily, Movantik 25 mg daily.  Senna as needed - Will need outpatient follow-up for colonoscopy   Proteus and ESBL E. coli UTI Urinary retention - Recurrent UTIs likely secondary to obstructive uropathy in the setting of severe constipation - Catheter was inserted at SNF - Urine cultures with ESBL E. coli and Proteus - Continue Flomax - Meropenem to complete 7-day course on 2/20 - Voiding trial on 2/18.  Now with purwick and doing well    Acute metabolic encephalopathy - Secondary to UTI, resolved now   Hypokalemia, resolved -  Replace as needed   Hyperthyroidism - Appears patient was previously on methimazole which was discontinued during recent admission due to elevated TSH.  This admission TSH 0.73, T4  1.03 - Follow-up outpatient, recheck TSH.  May need to resume methimazole   Diabetes mellitus type 2, insulin-dependent, poorly controlled with hyperglycemia - Most recent hemoglobin A1c 10.9% - Hold metformin while admitted, resume at discharge - Continue with basal/bolus insulin, sliding scale insulin, titrate up as tolerated   History of DVT - Continue home dose Eliquis   Major depressive disorder, recurrent severe - Continue home dose lithium, levels within normal limits - Continue Seroquel home dose - Takes gabapentin at home, resume home dose    Class I obesity BMI 34 - Outpatient follow up for lifestyle modification and risk factor management   Left kidney cyst - Incidentally seen on CT - This is chronic, longstanding 2.9 cm hyperattenuating lesion in the medial aspect of left kidney.  Requires no specific follow-up.   Hiatal hernia - Incidentally seen on CT - Small, unchanged.   Poor dentition - Needs close outpatient follow-up with dentist - Magic mouthwash for now

## 2023-07-05 NOTE — TOC Transition Note (Signed)
Transition of Care [facility]) - Discharge Note   Patient Details  Name: [name] MRN: [id number] Date of Birth: 1955/09/30  [facility] CM/SW Contact:  Elliot Gault, LCSW Phone Number: 07/05/2023, 11:25 AM   Clinical Narrative:     Pt stable for dc per MD. Updated Debbie at [facility] who states pt can return today.   DC clinical sent electronically. RN to call report. EMS arranged.  No other TOC needs for dc.  Final next level of care: Long Term Nursing Home Barriers to Discharge: Barriers Resolved   Patient Goals and CMS Choice Patient states their goals for this hospitalization and ongoing recovery are:: get better          Discharge Placement                       Discharge Plan and Services Additional resources added to the After Visit Summary for   In-house Referral: Clinical Social Work                                   Social Drivers of Health (SDOH) Interventions SDOH Screenings   Food Insecurity: No Food Insecurity (06/28/2023)  Housing: Low Risk  (06/28/2023)  Transportation Needs: No Transportation Needs (06/28/2023)  Utilities: Not At Risk (06/28/2023)  Alcohol Screen: Low Risk  (11/14/2017)  Social Connections: Socially Isolated (06/28/2023)  Tobacco Use: Medium Risk (06/28/2023)     Readmission Risk Interventions    06/29/2023   10:37 AM 05/24/2023    4:41 PM 05/22/2023    2:07 PM  Readmission Risk Prevention Plan  Transportation Screening Complete Complete Complete  Medication Review Oceanographer) Complete Complete Complete  PCP or Specialist appointment within 3-5 days of discharge   Complete  HRI or Home Care Consult Complete Complete Complete  SW Recovery Care/Counseling Consult Complete Complete Complete  Palliative Care Screening Not Applicable Not Applicable Not Applicable  Skilled Nursing Facility Complete Complete Complete

## 2023-07-05 NOTE — Care Management Important Message (Signed)
Important Message  Patient Details  Name: [name] MRN: [id number] Date of Birth: [birth year]   Important Message Given:  Yes - Medicare IM (reviewed letter with sister Melissa telephonically at [phone])     Corey Harold 07/05/2023, 12:28 PM

## 2023-07-19 DIAGNOSIS — R262 Difficulty in walking, not elsewhere classified: Secondary | ICD-10-CM | POA: Insufficient documentation

## 2023-07-19 DIAGNOSIS — T402X5A Adverse effect of other opioids, initial encounter: Secondary | ICD-10-CM | POA: Insufficient documentation

## 2023-07-19 DIAGNOSIS — Z87898 Personal history of other specified conditions: Secondary | ICD-10-CM | POA: Insufficient documentation

## 2023-07-19 DIAGNOSIS — N390 Urinary tract infection, site not specified: Secondary | ICD-10-CM | POA: Insufficient documentation

## 2023-07-19 NOTE — Progress Notes (Deleted)
 Name: [name] DOB: 01-22-1956 MRN: [id number]  History of Present Illness: Ms. [name] is a 68 y.o. female who presents today for follow up visit at [facility] [place]. She resides at [facility] and is accompanied by ***. - GU history: 1. Recurrent UTI. 2. Episodic urinary retention secondary to constipation.   Urine culture results in past 12 months: - 01/24/2023: Negative - 02/13/2023: Positive for 80k E. Coli (ESBL) - 05/01/2023: Positive for >100k E. Coli (ESBL) - 06/28/2023: Positive for >100k Proteus mirabilis & E. Coli (ESBL)  At last visit with J. Summerlin, PA on 09/26/2021: - PVR = 20 ml. - "She is a risk for UTI as she remains non-ambulatory and is doing poorly with ambulation due to ongoing health issues. FU in 6-8 weeks for UA/PVR" - "Orders given for facility to run prn UA, PVR, foley placement if needed"  Recent history: 06/28/2023-07/05/2023:  - Admitted for stercoral colitis / abdominal pain secondary to chronic opioid-induced constipation. Manually disimpacted in the ED. On aggressive bowel regimen: Lactulose 20 mg 3 times daily, lubiprostone 24 mcg twice daily, Movantik 25 mg daily. Senna PRN. GI advised follow up for outpatient colonoscopy. - Found to have UTI during admission. Urine culture grew Proteus and E. Coli (ESBL).  - Per discharge summary: "Recurrent UTIs are likely secondary to obstructive uropathy in the setting of severe constipation. Catheter was inserted at skilled nursing facility but she passed trial of void here and did well with purwick only. She completed a 7-day course of meropenem. She will continue with Flomax."   Today: She reports ***  She {Actions; denies-reports:120008} increased urinary urgency, frequency, nocturia, dysuria, gross hematuria, hesitancy, straining to void, or sensations of incomplete emptying.  ***start daily abx for UTI prevention & topical vaginal estrogen cream   Medications: Current  Outpatient Medications  Medication Sig Dispense Refill   acetaminophen (TYLENOL) 325 MG tablet Take 650 mg by mouth every 4 (four) hours as needed for mild pain.     ascorbic acid (VITAMIN C) 500 MG tablet Take 500 mg by mouth 2 (two) times daily.     bisacodyl (DULCOLAX) 10 MG suppository Place 10 mg rectally as needed for moderate constipation.     calcium carbonate (TUMS - DOSED IN MG ELEMENTAL CALCIUM) 500 MG chewable tablet Chew 1 tablet by mouth every 5 (five) hours as needed for indigestion or heartburn.     cetirizine (ZYRTEC) 10 MG tablet Take 10 mg by mouth daily. For allergies     ELIQUIS 5 MG TABS tablet Take 5 mg by mouth 2 (two) times daily.     folic acid (FOLVITE) 1 MG tablet Take 1 mg by mouth daily.     gabapentin (NEURONTIN) 400 MG capsule Take 1 capsule (400 mg total) by mouth 3 (three) times daily for 17 days. 51 capsule 0   insulin aspart (FIASP FLEXTOUCH) 100 UNIT/ML FlexTouch Pen Inject 0-10 Units into the skin 4 (four) times daily -  before meals and at bedtime. Short acting insulin (Aspart) per sliding scale 0-10 ---:-- Insulin injection 0-10 Units 0-10 Units Subcutaneous, 3 times daily with meals CBG 70 - 120: 0 unit CBG 121 - 150: 0 unit  CBG 151 - 200: 1 unit CBG 201 - 250: 2 units CBG 251 - 300: 4 units CBG 301 - 350: 6 units  CBG 351 - 400: 8 units  CBG > 400: 10 units 15 mL 1   Insulin Glargine (BASAGLAR KWIKPEN) 100 UNIT/ML Inject  10 Units into the skin daily.     LACTOBACILLUS PO Take 1 capsule by mouth daily.     lactulose (CHRONULAC) 10 GM/15ML solution Take 10 g by mouth daily as needed for moderate constipation.     lactulose (CHRONULAC) 10 GM/15ML solution Take 30 mLs (20 g total) by mouth 3 (three) times daily.     lidocaine 4 % Place 1 patch onto the skin daily. Remove & Discard patch within 12 hours or as directed by MD; Apply to LOWER LEG Listed as Asperflex Pain Relieving Patch 4% on MAR     lithium 600 MG capsule Take 600 mg by mouth daily.     lithium  carbonate 300 MG capsule Take 300 mg by mouth at bedtime.     loperamide (IMODIUM A-D) 2 MG tablet Take 2 mg by mouth every 6 (six) hours as needed for diarrhea or loose stools.     lubiprostone (AMITIZA) 24 MCG capsule Take 24 mcg by mouth 2 (two) times daily with a meal.     Melatonin 10 MG TABS Take 10 mg by mouth at bedtime.     metFORMIN (GLUCOPHAGE) 500 MG tablet Take 1 pill a day (Patient taking differently: Take 500 mg by mouth daily with breakfast. Take 1 pill a day) 30 tablet 0   Multiple Vitamin (MULTIVITAMIN ADULT PO) Take 1 tablet by mouth daily.     naloxegol oxalate (MOVANTIK) 25 MG TABS tablet Take 1 tablet (25 mg total) by mouth daily. 30 tablet 0   ondansetron (ZOFRAN) 4 MG tablet Take 4 mg by mouth every 8 (eight) hours as needed for nausea or vomiting.     oxyCODONE-acetaminophen (PERCOCET) 10-325 MG tablet Take 1 tablet by mouth every 6 (six) hours as needed for pain. (Patient taking differently: Take 1 tablet by mouth every 4 (four) hours as needed for pain.) 15 tablet 0   phenol (CHLORASEPTIC) 1.4 % LIQD Use as directed 5 sprays in the mouth or throat every 2 (two) hours as needed for throat irritation / pain.     polyethylene glycol (MIRALAX / GLYCOLAX) 17 g packet Take 17 g by mouth 2 (two) times daily.     PRESCRIPTION MEDICATION Apply 1 Application topically in the morning, at noon, in the evening, and at bedtime. Muscle Rub External Cream 10-15% (Menthol-Methyl Salicylate (Liniments))  Apply to feet for pain     QUEtiapine (SEROQUEL) 300 MG tablet Take 300 mg by mouth at bedtime.     QUEtiapine (SEROQUEL) 50 MG tablet Take 50 mg by mouth daily.     tamsulosin (FLOMAX) 0.4 MG CAPS capsule Take 0.4 mg by mouth daily.     No current facility-administered medications for this visit.    Allergies: Allergies  Allergen Reactions   Sulfa Antibiotics Swelling and Rash    Neck swelling    Past Medical History:  Diagnosis Date   Allergy    Bipolar disorder (HCC)     Depression    Drug abuse (HCC)    history of, went to rehab 2015   GERD (gastroesophageal reflux disease)    Lung cancer (HCC)    lung ca dx 11/11- right upper lobe   Past Surgical History:  Procedure Laterality Date   ABDOMINAL AORTOGRAM W/LOWER EXTREMITY Bilateral 01/10/2022   Procedure: ABDOMINAL AORTOGRAM W/LOWER EXTREMITY;  Surgeon: Nada Libman, MD;  Location: [facility];  Service: Cardiovascular;  Laterality: Bilateral;   ANKLE FRACTURE SURGERY Left    HARDWARE REMOVAL Left 12/13/2021  Procedure: LEFT ANKLE HARDWARE REMOVAL;  Surgeon: Marcene Corning, MD;  Location: [facility];  Service: Orthopedics;  Laterality: Left;   LUNG LOBECTOMY Right 2011   RUL removed for lung cancer   STYLOID PROCESS EXCISION Left 10/16/2014   Procedure: EXCISION LEFT STYLOID PROCESS;  Surgeon: Drema Halon, MD;  Location: [facility];  Service: ENT;  Laterality: Left;   TONSILLECTOMY Bilateral 10/16/2014   Procedure: BILATERAL TONSILLECTOMY;  Surgeon: Drema Halon, MD;  Location: [facility];  Service: ENT;  Laterality: Bilateral;   TUBAL LIGATION  1984   Family History  Problem Relation Age of Onset   Cancer Mother        breast cancer   Cancer Father        stomach cancer   Diabetes Father    Cancer Sister        breast cancer   Diabetes Brother    Diabetes Brother    Cancer Sister        breast cancer   Social History   Socioeconomic History   Marital status: Single    Spouse name: Not on file   Number of children: Not on file   Years of education: Not on file   Highest education level: Not on file  Occupational History   Not on file  Tobacco Use   Smoking status: Former    Current packs/day: 1.00    Average packs/day: 1 pack/day for 40.0 years (40.0 ttl pk-yrs)    Types: Cigarettes   Smokeless tobacco: Never  Vaping Use   Vaping status: Former  Substance and Sexual Activity   Alcohol use: Not Currently    Comment: hx  alcoholism.  none since 2017   Drug use: Not Currently    Types: Benzodiazepines    Comment: hx of xanax abuse. last use 2015   Sexual activity: Not on file  Other Topics Concern   Not on file  Social History Narrative   Not on file   Social Drivers of Health   Financial Resource Strain: Not on file  Food Insecurity: No Food Insecurity (06/28/2023)   Hunger Vital Sign    Worried About Running Out of Food in the Last Year: Never true    Ran Out of Food in the Last Year: Never true  Transportation Needs: No Transportation Needs (06/28/2023)   PRAPARE - Administrator, Civil Service (Medical): No    Lack of Transportation (Non-Medical): No  Physical Activity: Not on file  Stress: Not on file  Social Connections: Socially Isolated (06/28/2023)   Social Connection and Isolation Panel [NHANES]    Frequency of Communication with Friends and Family: Three times a week    Frequency of Social Gatherings with Friends and Family: Never    Attends Religious Services: Never    Database administrator or Organizations: No    Attends Banker Meetings: Never    Marital Status: Never married  Intimate Partner Violence: Not At Risk (06/28/2023)   Humiliation, Afraid, Rape, and Kick questionnaire    Fear of Current or Ex-Partner: No    Emotionally Abused: No    Physically Abused: No    Sexually Abused: No    Review of Systems Constitutional: Patient denies any unintentional weight loss or change in strength lntegumentary: Patient denies any rashes or pruritus Cardiovascular: Patient denies chest pain or syncope Respiratory: Patient denies shortness of breath Gastrointestinal: ***Patient {Actions; denies-reports:120008} ***nausea, ***vomiting, ***constipation, ***diarrhea ***As per HPI  Musculoskeletal: Patient denies muscle cramps or weakness Neurologic: Patient denies convulsions or seizures Allergic/Immunologic: Patient denies recent allergic  reaction(s) Hematologic/Lymphatic: Patient denies bleeding tendencies Endocrine: Patient denies heat/cold intolerance  GU: As per HPI.  OBJECTIVE There were no vitals filed for this visit. There is no height or weight on file to calculate BMI.  Physical Examination Constitutional: No obvious distress; patient is non-toxic appearing  Cardiovascular: No visible lower extremity edema.  Respiratory: The patient does not have audible wheezing/stridor; respirations do not appear labored  Gastrointestinal: Abdomen non-distended Musculoskeletal: Normal ROM of UEs  Skin: No obvious rashes/open sores  Neurologic: CN 2-12 grossly intact Psychiatric: Answered questions appropriately with normal affect  Hematologic/Lymphatic/Immunologic: No obvious bruises or sites of spontaneous bleeding  UA:  ***positive for *** leukocytes, *** blood, ***nitrites ***Urine microscopy:  ***negative  *** WBC/hpf, *** RBC/hpf, *** bacteria ***with no evidence of UTI ***with no evidence of microscopic hematuria ***otherwise unremarkable ***glucosuria (secondary to ***Jardiance ***Farxiga use)  PVR: *** ml  ASSESSMENT No diagnosis found. ***  We agreed to plan for follow up in *** months / ***1 year or sooner if needed. Patient verbalized understanding of and agreement with current plan. All questions were answered.  PLAN Advised the following: 1. *** 2. ***No follow-ups on file.  No orders of the defined types were placed in this encounter.   It has been explained that the patient is to follow regularly with their PCP in addition to all other providers involved in their care and to follow instructions provided by these respective offices. Patient advised to contact urology clinic if any urologic-pertaining questions, concerns, new symptoms or problems arise in the interim period.  There are no Patient Instructions on file for this visit.  Electronically signed by:  Donnita Falls, FNP   07/19/23     4:45 PM

## 2023-07-20 ENCOUNTER — Ambulatory Visit: Payer: Medicare Other | Admitting: Urology

## 2023-07-20 DIAGNOSIS — Z87898 Personal history of other specified conditions: Secondary | ICD-10-CM

## 2023-07-20 DIAGNOSIS — T402X5A Adverse effect of other opioids, initial encounter: Secondary | ICD-10-CM

## 2023-07-20 DIAGNOSIS — N390 Urinary tract infection, site not specified: Secondary | ICD-10-CM

## 2023-07-20 DIAGNOSIS — R262 Difficulty in walking, not elsewhere classified: Secondary | ICD-10-CM

## 2023-07-20 DIAGNOSIS — Z09 Encounter for follow-up examination after completed treatment for conditions other than malignant neoplasm: Secondary | ICD-10-CM

## 2023-07-20 DIAGNOSIS — B9629 Other Escherichia coli [E. coli] as the cause of diseases classified elsewhere: Secondary | ICD-10-CM

## 2023-08-20 ENCOUNTER — Ambulatory Visit: Admitting: Urology

## 2023-08-20 NOTE — Progress Notes (Deleted)
 Name: [name] DOB: [birth year] MRN: [id number]  History of Present Illness: Ms. Pingleton is a 68 y.o. female who presents today for follow up visit at [facility] [place]. She resides at [facility] and is accompanied by ***. - GU history: 1. Recurrent UTI. 2. Episodic urinary retention secondary to constipation.   Urine culture results in past 12 months: - 01/24/2023: Negative - 02/13/2023: Positive for 80k E. Coli (ESBL) - 05/01/2023: Positive for >100k E. Coli (ESBL) - 06/28/2023: Positive for >100k Proteus mirabilis & E. Coli (ESBL)  At last visit with J. Summerlin, PA on 09/26/2021: - PVR = 20 ml. - "She is a risk for UTI as she remains non-ambulatory and is doing poorly with ambulation due to ongoing health issues. FU in 6-8 weeks for UA/PVR" - "Orders given for facility to run prn UA, PVR, foley placement if needed"  Recent history: 06/28/2023-07/05/2023:  - Admitted for stercoral colitis / abdominal pain secondary to chronic opioid-induced constipation. Manually disimpacted in the ED. On aggressive bowel regimen: Lactulose 20 mg 3 times daily, lubiprostone 24 mcg twice daily, Movantik 25 mg daily. Senna PRN. GI advised follow up for outpatient colonoscopy. - Found to have UTI during admission. Urine culture grew Proteus and E. Coli (ESBL).  - Per discharge summary: "Recurrent UTIs are likely secondary to obstructive uropathy in the setting of severe constipation. Catheter was inserted at skilled nursing facility but she passed trial of void here and did well with purwick only. She completed a 7-day course of meropenem. She will continue with Flomax."   Today: She reports ***  She {Actions; denies-reports:120008} increased urinary urgency, frequency, nocturia, dysuria, gross hematuria, hesitancy, straining to void, or sensations of incomplete emptying.  ***start daily abx for UTI prevention & topical vaginal estrogen cream  Medications: Current  Outpatient Medications  Medication Sig Dispense Refill   acetaminophen (TYLENOL) 325 MG tablet Take 650 mg by mouth every 4 (four) hours as needed for mild pain.     ascorbic acid (VITAMIN C) 500 MG tablet Take 500 mg by mouth 2 (two) times daily.     bisacodyl (DULCOLAX) 10 MG suppository Place 10 mg rectally as needed for moderate constipation.     calcium carbonate (TUMS - DOSED IN MG ELEMENTAL CALCIUM) 500 MG chewable tablet Chew 1 tablet by mouth every 5 (five) hours as needed for indigestion or heartburn.     cetirizine (ZYRTEC) 10 MG tablet Take 10 mg by mouth daily. For allergies     ELIQUIS 5 MG TABS tablet Take 5 mg by mouth 2 (two) times daily.     folic acid (FOLVITE) 1 MG tablet Take 1 mg by mouth daily.     gabapentin (NEURONTIN) 400 MG capsule Take 1 capsule (400 mg total) by mouth 3 (three) times daily for 17 days. 51 capsule 0   insulin aspart (FIASP FLEXTOUCH) 100 UNIT/ML FlexTouch Pen Inject 0-10 Units into the skin 4 (four) times daily -  before meals and at bedtime. Short acting insulin (Aspart) per sliding scale 0-10 ---:-- Insulin injection 0-10 Units 0-10 Units Subcutaneous, 3 times daily with meals CBG 70 - 120: 0 unit CBG 121 - 150: 0 unit  CBG 151 - 200: 1 unit CBG 201 - 250: 2 units CBG 251 - 300: 4 units CBG 301 - 350: 6 units  CBG 351 - 400: 8 units  CBG > 400: 10 units 15 mL 1   Insulin Glargine (BASAGLAR KWIKPEN) 100 UNIT/ML Inject 10  Units into the skin daily.     LACTOBACILLUS PO Take 1 capsule by mouth daily.     lactulose (CHRONULAC) 10 GM/15ML solution Take 10 g by mouth daily as needed for moderate constipation.     lactulose (CHRONULAC) 10 GM/15ML solution Take 30 mLs (20 g total) by mouth 3 (three) times daily.     lidocaine 4 % Place 1 patch onto the skin daily. Remove & Discard patch within 12 hours or as directed by MD; Apply to LOWER LEG Listed as Asperflex Pain Relieving Patch 4% on MAR     lithium 600 MG capsule Take 600 mg by mouth daily.     lithium  carbonate 300 MG capsule Take 300 mg by mouth at bedtime.     loperamide (IMODIUM A-D) 2 MG tablet Take 2 mg by mouth every 6 (six) hours as needed for diarrhea or loose stools.     lubiprostone (AMITIZA) 24 MCG capsule Take 24 mcg by mouth 2 (two) times daily with a meal.     Melatonin 10 MG TABS Take 10 mg by mouth at bedtime.     metFORMIN (GLUCOPHAGE) 500 MG tablet Take 1 pill a day (Patient taking differently: Take 500 mg by mouth daily with breakfast. Take 1 pill a day) 30 tablet 0   Multiple Vitamin (MULTIVITAMIN ADULT PO) Take 1 tablet by mouth daily.     ondansetron (ZOFRAN) 4 MG tablet Take 4 mg by mouth every 8 (eight) hours as needed for nausea or vomiting.     oxyCODONE-acetaminophen (PERCOCET) 10-325 MG tablet Take 1 tablet by mouth every 6 (six) hours as needed for pain. (Patient taking differently: Take 1 tablet by mouth every 4 (four) hours as needed for pain.) 15 tablet 0   phenol (CHLORASEPTIC) 1.4 % LIQD Use as directed 5 sprays in the mouth or throat every 2 (two) hours as needed for throat irritation / pain.     polyethylene glycol (MIRALAX / GLYCOLAX) 17 g packet Take 17 g by mouth 2 (two) times daily.     PRESCRIPTION MEDICATION Apply 1 Application topically in the morning, at noon, in the evening, and at bedtime. Muscle Rub External Cream 10-15% (Menthol-Methyl Salicylate (Liniments))  Apply to feet for pain     QUEtiapine (SEROQUEL) 300 MG tablet Take 300 mg by mouth at bedtime.     QUEtiapine (SEROQUEL) 50 MG tablet Take 50 mg by mouth daily.     tamsulosin (FLOMAX) 0.4 MG CAPS capsule Take 0.4 mg by mouth daily.     No current facility-administered medications for this visit.    Allergies: Allergies  Allergen Reactions   Sulfa Antibiotics Swelling and Rash    Neck swelling    Past Medical History:  Diagnosis Date   Allergy    Bipolar disorder (HCC)    Depression    Drug abuse (HCC)    history of, went to rehab 2015   GERD (gastroesophageal reflux disease)     Lung cancer (HCC)    lung ca dx 11/11- right upper lobe   Past Surgical History:  Procedure Laterality Date   ABDOMINAL AORTOGRAM W/LOWER EXTREMITY Bilateral 01/10/2022   Procedure: ABDOMINAL AORTOGRAM W/LOWER EXTREMITY;  Surgeon: Nada Libman, MD;  Location: [facility];  Service: Cardiovascular;  Laterality: Bilateral;   ANKLE FRACTURE SURGERY Left    HARDWARE REMOVAL Left 12/13/2021   Procedure: LEFT ANKLE HARDWARE REMOVAL;  Surgeon: Marcene Corning, MD;  Location: [facility];  Service: Orthopedics;  Laterality: Left;  LUNG LOBECTOMY Right 2011   RUL removed for lung cancer   STYLOID PROCESS EXCISION Left 10/16/2014   Procedure: EXCISION LEFT STYLOID PROCESS;  Surgeon: Drema Halon, MD;  Location: [facility];  Service: ENT;  Laterality: Left;   TONSILLECTOMY Bilateral 10/16/2014   Procedure: BILATERAL TONSILLECTOMY;  Surgeon: Drema Halon, MD;  Location: [facility];  Service: ENT;  Laterality: Bilateral;   TUBAL LIGATION  1984   Family History  Problem Relation Age of Onset   Cancer Mother        breast cancer   Cancer Father        stomach cancer   Diabetes Father    Cancer Sister        breast cancer   Diabetes Brother    Diabetes Brother    Cancer Sister        breast cancer   Social History   Socioeconomic History   Marital status: Single    Spouse name: Not on file   Number of children: Not on file   Years of education: Not on file   Highest education level: Not on file  Occupational History   Not on file  Tobacco Use   Smoking status: Former    Current packs/day: 1.00    Average packs/day: 1 pack/day for 40.0 years (40.0 ttl pk-yrs)    Types: Cigarettes   Smokeless tobacco: Never  Vaping Use   Vaping status: Former  Substance and Sexual Activity   Alcohol use: Not Currently    Comment: hx alcoholism.  none since 2017   Drug use: Not Currently    Types: Benzodiazepines    Comment: hx of xanax abuse. last  use 2015   Sexual activity: Not on file  Other Topics Concern   Not on file  Social History Narrative   Not on file   Social Drivers of Health   Financial Resource Strain: Not on file  Food Insecurity: No Food Insecurity (06/28/2023)   Hunger Vital Sign    Worried About Running Out of Food in the Last Year: Never true    Ran Out of Food in the Last Year: Never true  Transportation Needs: No Transportation Needs (06/28/2023)   PRAPARE - Administrator, Civil Service (Medical): No    Lack of Transportation (Non-Medical): No  Physical Activity: Not on file  Stress: Not on file  Social Connections: Socially Isolated (06/28/2023)   Social Connection and Isolation Panel [NHANES]    Frequency of Communication with Friends and Family: Three times a week    Frequency of Social Gatherings with Friends and Family: Never    Attends Religious Services: Never    Database administrator or Organizations: No    Attends Banker Meetings: Never    Marital Status: Never married  Intimate Partner Violence: Not At Risk (06/28/2023)   Humiliation, Afraid, Rape, and Kick questionnaire    Fear of Current or Ex-Partner: No    Emotionally Abused: No    Physically Abused: No    Sexually Abused: No    Review of Systems Constitutional: Patient denies any unintentional weight loss or change in strength lntegumentary: Patient denies any rashes or pruritus Cardiovascular: Patient denies chest pain or syncope Respiratory: Patient denies shortness of breath Gastrointestinal: ***Patient {Actions; denies-reports:120008} ***nausea, ***vomiting, ***constipation, ***diarrhea ***As per HPI Musculoskeletal: Patient denies muscle cramps or weakness Neurologic: Patient denies convulsions or seizures Allergic/Immunologic: Patient denies recent allergic reaction(s) Hematologic/Lymphatic: Patient denies  bleeding tendencies Endocrine: Patient denies heat/cold intolerance  GU: As per  HPI.  OBJECTIVE There were no vitals filed for this visit. There is no height or weight on file to calculate BMI.  Physical Examination Constitutional: No obvious distress; patient is non-toxic appearing  Cardiovascular: No visible lower extremity edema.  Respiratory: The patient does not have audible wheezing/stridor; respirations do not appear labored  Gastrointestinal: Abdomen non-distended Musculoskeletal: Normal ROM of UEs  Skin: No obvious rashes/open sores  Neurologic: CN 2-12 grossly intact Psychiatric: Answered questions appropriately with normal affect  Hematologic/Lymphatic/Immunologic: No obvious bruises or sites of spontaneous bleeding  UA: ***negative ***positive for *** leukocytes, *** blood, ***nitrites Urine microscopy: *** WBC/hpf, *** RBC/hpf, *** bacteria ***otherwise unremarkable ***glucosuria (secondary to ***Jardiance ***Farxiga use)  PVR: *** ml  ASSESSMENT Urinary incontinence, unspecified type  ***  We agreed to plan for follow up in *** months or sooner if needed. Patient verbalized understanding of and agreement with current plan. All questions were answered.  PLAN Advised the following: 1. *** 2. ***No follow-ups on file.  No orders of the defined types were placed in this encounter.   It has been explained that the patient is to follow regularly with their PCP in addition to all other providers involved in their care and to follow instructions provided by these respective offices. Patient advised to contact urology clinic if any urologic-pertaining questions, concerns, new symptoms or problems arise in the interim period.  There are no Patient Instructions on file for this visit.  Electronically signed by:  Donnita Falls, FNP   08/20/23    8:20 AM

## 2024-01-04 ENCOUNTER — Encounter: Payer: Self-pay | Admitting: Radiology

## 2024-03-17 ENCOUNTER — Encounter: Payer: Self-pay | Admitting: Radiology

## 2024-06-10 ENCOUNTER — Inpatient Hospital Stay (HOSPITAL_COMMUNITY)
Admission: EM | Admit: 2024-06-10 | Discharge: 2024-06-13 | DRG: 871 | Disposition: A | Discharge status: Skilled Nursing Facility | Source: Skilled Nursing Facility | Attending: Internal Medicine | Admitting: Internal Medicine

## 2024-06-10 ENCOUNTER — Emergency Department (HOSPITAL_COMMUNITY)

## 2024-06-10 DIAGNOSIS — Z87891 Personal history of nicotine dependence: Secondary | ICD-10-CM | POA: Diagnosis not present

## 2024-06-10 DIAGNOSIS — E1165 Type 2 diabetes mellitus with hyperglycemia: Secondary | ICD-10-CM | POA: Diagnosis present

## 2024-06-10 DIAGNOSIS — G8929 Other chronic pain: Secondary | ICD-10-CM | POA: Diagnosis present

## 2024-06-10 DIAGNOSIS — Z66 Do not resuscitate: Secondary | ICD-10-CM | POA: Diagnosis present

## 2024-06-10 DIAGNOSIS — Z794 Long term (current) use of insulin: Secondary | ICD-10-CM | POA: Diagnosis not present

## 2024-06-10 DIAGNOSIS — K5909 Other constipation: Secondary | ICD-10-CM | POA: Diagnosis present

## 2024-06-10 DIAGNOSIS — N390 Urinary tract infection, site not specified: Secondary | ICD-10-CM | POA: Diagnosis present

## 2024-06-10 DIAGNOSIS — J189 Pneumonia, unspecified organism: Secondary | ICD-10-CM | POA: Diagnosis present

## 2024-06-10 DIAGNOSIS — I10 Essential (primary) hypertension: Secondary | ICD-10-CM | POA: Diagnosis present

## 2024-06-10 DIAGNOSIS — Z833 Family history of diabetes mellitus: Secondary | ICD-10-CM

## 2024-06-10 DIAGNOSIS — J310 Chronic rhinitis: Secondary | ICD-10-CM | POA: Diagnosis present

## 2024-06-10 DIAGNOSIS — Z7901 Long term (current) use of anticoagulants: Secondary | ICD-10-CM

## 2024-06-10 DIAGNOSIS — Z1152 Encounter for screening for COVID-19: Secondary | ICD-10-CM

## 2024-06-10 DIAGNOSIS — E66812 Obesity, class 2: Secondary | ICD-10-CM | POA: Diagnosis present

## 2024-06-10 DIAGNOSIS — J9601 Acute respiratory failure with hypoxia: Secondary | ICD-10-CM | POA: Diagnosis present

## 2024-06-10 DIAGNOSIS — Z993 Dependence on wheelchair: Secondary | ICD-10-CM

## 2024-06-10 DIAGNOSIS — Z803 Family history of malignant neoplasm of breast: Secondary | ICD-10-CM

## 2024-06-10 DIAGNOSIS — Z6835 Body mass index (BMI) 35.0-35.9, adult: Secondary | ICD-10-CM

## 2024-06-10 DIAGNOSIS — M21371 Foot drop, right foot: Secondary | ICD-10-CM | POA: Diagnosis present

## 2024-06-10 DIAGNOSIS — Z8619 Personal history of other infectious and parasitic diseases: Secondary | ICD-10-CM

## 2024-06-10 DIAGNOSIS — A419 Sepsis, unspecified organism: Principal | ICD-10-CM | POA: Diagnosis present

## 2024-06-10 DIAGNOSIS — Z79899 Other long term (current) drug therapy: Secondary | ICD-10-CM

## 2024-06-10 DIAGNOSIS — Z902 Acquired absence of lung [part of]: Secondary | ICD-10-CM | POA: Diagnosis not present

## 2024-06-10 DIAGNOSIS — Z800 Family history of malignant neoplasm of digestive organs: Secondary | ICD-10-CM

## 2024-06-10 DIAGNOSIS — E872 Acidosis, unspecified: Secondary | ICD-10-CM | POA: Diagnosis present

## 2024-06-10 DIAGNOSIS — Z85118 Personal history of other malignant neoplasm of bronchus and lung: Secondary | ICD-10-CM | POA: Diagnosis not present

## 2024-06-10 DIAGNOSIS — F319 Bipolar disorder, unspecified: Secondary | ICD-10-CM | POA: Diagnosis present

## 2024-06-10 DIAGNOSIS — Z882 Allergy status to sulfonamides status: Secondary | ICD-10-CM

## 2024-06-10 DIAGNOSIS — Z86718 Personal history of other venous thrombosis and embolism: Secondary | ICD-10-CM

## 2024-06-10 DIAGNOSIS — Z7984 Long term (current) use of oral hypoglycemic drugs: Secondary | ICD-10-CM | POA: Diagnosis not present

## 2024-06-10 LAB — LACTIC ACID, PLASMA
Lactic Acid, Venous: 2.4 mmol/L (ref 0.5–1.9)
Lactic Acid, Venous: 2.1 mmol/L (ref 0.5–1.9)

## 2024-06-10 LAB — CBC WITH DIFFERENTIAL/PLATELET
Abs Immature Granulocytes: 0.02 10*3/uL (ref 0.00–0.07)
Basophils Absolute: 0 10*3/uL (ref 0.0–0.1)
Basophils Relative: 0 %
Eosinophils Absolute: 0 10*3/uL (ref 0.0–0.5)
Eosinophils Relative: 0 %
HCT: 43 % (ref 36.0–46.0)
Hemoglobin: 13.7 g/dL (ref 12.0–15.0)
Immature Granulocytes: 0 %
Lymphocytes Relative: 3 %
Lymphs Abs: 0.2 10*3/uL — ABNORMAL LOW (ref 0.7–4.0)
MCH: 28.7 pg (ref 26.0–34.0)
MCHC: 31.9 g/dL (ref 30.0–36.0)
MCV: 90.1 fL (ref 80.0–100.0)
Monocytes Absolute: 0.5 10*3/uL (ref 0.1–1.0)
Monocytes Relative: 7 %
Neutro Abs: 6.1 10*3/uL (ref 1.7–7.7)
Neutrophils Relative %: 90 %
Platelets: 164 10*3/uL (ref 150–400)
RBC: 4.77 MIL/uL (ref 3.87–5.11)
RDW: 14.6 % (ref 11.5–15.5)
WBC: 6.9 10*3/uL (ref 4.0–10.5)
nRBC: 0 % (ref 0.0–0.2)

## 2024-06-10 LAB — PROTIME-INR
INR: 1 (ref 0.8–1.2)
Prothrombin Time: 13.5 s (ref 11.4–15.2)

## 2024-06-10 LAB — COMPREHENSIVE METABOLIC PANEL WITH GFR
ALT: 30 U/L (ref 0–44)
AST: 17 U/L (ref 15–41)
Albumin: 3.9 g/dL (ref 3.5–5.0)
Alkaline Phosphatase: 198 U/L — ABNORMAL HIGH (ref 38–126)
Anion gap: 15 (ref 5–15)
BUN: 5 mg/dL — ABNORMAL LOW (ref 8–23)
CO2: 22 mmol/L (ref 22–32)
Calcium: 9 mg/dL (ref 8.9–10.3)
Chloride: 95 mmol/L — ABNORMAL LOW (ref 98–111)
Creatinine, Ser: 0.61 mg/dL (ref 0.44–1.00)
GFR, Estimated: >60 mL/min
Glucose, Bld: 352 mg/dL — ABNORMAL HIGH (ref 70–99)
Potassium: 4 mmol/L (ref 3.5–5.1)
Sodium: 132 mmol/L — ABNORMAL LOW (ref 135–145)
Total Bilirubin: 0.4 mg/dL (ref 0.0–1.2)
Total Protein: 7.4 g/dL (ref 6.5–8.1)

## 2024-06-10 LAB — RESP PANEL BY RT-PCR (RSV, FLU A&B, COVID)  RVPGX2
Influenza A by PCR: NEGATIVE
Influenza B by PCR: NEGATIVE
Resp Syncytial Virus by PCR: NEGATIVE
SARS Coronavirus 2 by RT PCR: NEGATIVE

## 2024-06-10 MED ORDER — SODIUM CHLORIDE 0.9 % IV SOLN
1.0000 g | Freq: Once | INTRAVENOUS | Status: AC
  Administered 2024-06-10: 1 g via INTRAVENOUS
  Filled 2024-06-10: qty 10

## 2024-06-10 MED ORDER — INSULIN ASPART 100 UNIT/ML IJ SOLN
0.0000 [IU] | Freq: Three times a day (TID) | INTRAMUSCULAR | Status: DC
  Administered 2024-06-11: 8 [IU] via SUBCUTANEOUS
  Administered 2024-06-11: 11 [IU] via SUBCUTANEOUS
  Administered 2024-06-11: 5 [IU] via SUBCUTANEOUS
  Administered 2024-06-12 – 2024-06-13 (×4): 8 [IU] via SUBCUTANEOUS
  Administered 2024-06-13: 11 [IU] via SUBCUTANEOUS
  Administered 2024-06-13: 8 [IU] via SUBCUTANEOUS
  Filled 2024-06-10 (×9): qty 1

## 2024-06-10 MED ORDER — LACTATED RINGERS IV BOLUS
1000.0000 mL | Freq: Once | INTRAVENOUS | Status: AC
  Administered 2024-06-10: 1000 mL via INTRAVENOUS

## 2024-06-10 MED ORDER — AZITHROMYCIN 250 MG PO TABS
500.0000 mg | ORAL_TABLET | Freq: Once | ORAL | Status: AC
  Administered 2024-06-10: 500 mg via ORAL
  Filled 2024-06-10: qty 2

## 2024-06-10 MED ORDER — ONDANSETRON HCL 4 MG PO TABS
4.0000 mg | ORAL_TABLET | Freq: Four times a day (QID) | ORAL | Status: DC | PRN
  Administered 2024-06-11: 4 mg via ORAL
  Filled 2024-06-10: qty 1

## 2024-06-10 MED ORDER — IPRATROPIUM-ALBUTEROL 0.5-2.5 (3) MG/3ML IN SOLN
3.0000 mL | Freq: Once | RESPIRATORY_TRACT | Status: AC
  Administered 2024-06-10: 3 mL via RESPIRATORY_TRACT
  Filled 2024-06-10: qty 3

## 2024-06-10 MED ORDER — ACETAMINOPHEN 325 MG PO TABS
650.0000 mg | ORAL_TABLET | Freq: Four times a day (QID) | ORAL | Status: DC | PRN
  Administered 2024-06-11: 650 mg via ORAL
  Filled 2024-06-10: qty 2

## 2024-06-10 MED ORDER — INSULIN ASPART 100 UNIT/ML IJ SOLN
0.0000 [IU] | Freq: Every day | INTRAMUSCULAR | Status: DC
  Administered 2024-06-11: 4 [IU] via SUBCUTANEOUS
  Administered 2024-06-12: 3 [IU] via SUBCUTANEOUS
  Filled 2024-06-10 (×2): qty 1

## 2024-06-10 MED ORDER — ONDANSETRON HCL 4 MG/2ML IJ SOLN: 4.0000 mg | Freq: Four times a day (QID) | INTRAMUSCULAR | Status: DC | PRN

## 2024-06-10 MED ORDER — SODIUM CHLORIDE 0.9 % IV SOLN
100.0000 mg | Freq: Two times a day (BID) | INTRAVENOUS | Status: DC
  Administered 2024-06-11 – 2024-06-12 (×2): 100 mg via INTRAVENOUS
  Filled 2024-06-10 (×4): qty 100

## 2024-06-10 MED ORDER — ACETAMINOPHEN 500 MG PO TABS
1000.0000 mg | ORAL_TABLET | Freq: Once | ORAL | Status: AC
  Administered 2024-06-10: 1000 mg via ORAL
  Filled 2024-06-10: qty 2

## 2024-06-10 MED ORDER — DM-GUAIFENESIN ER 30-600 MG PO TB12
1.0000 | ORAL_TABLET | Freq: Two times a day (BID) | ORAL | Status: DC
  Administered 2024-06-10 – 2024-06-13 (×7): 1 via ORAL
  Filled 2024-06-10 (×7): qty 1

## 2024-06-10 MED ORDER — SODIUM CHLORIDE 0.9 % IV SOLN: 2.0000 g | INTRAVENOUS | Status: DC

## 2024-06-10 MED ORDER — INSULIN GLARGINE-YFGN 100 UNIT/ML ~~LOC~~ SOLN
10.0000 [IU] | Freq: Every day | SUBCUTANEOUS | Status: DC
  Administered 2024-06-11: 10 [IU] via SUBCUTANEOUS
  Filled 2024-06-10 (×3): qty 0.1

## 2024-06-10 MED ORDER — ACETAMINOPHEN 650 MG RE SUPP: 650.0000 mg | Freq: Four times a day (QID) | RECTAL | Status: DC | PRN

## 2024-06-10 NOTE — Progress Notes (Signed)
" °   06/10/24 2157  Assess: MEWS Score  Temp 100 F (37.8 C)  BP (!) 90/55  MAP (mmHg) 68  Pulse Rate (!) 108  Resp 16  SpO2 94 %  O2 Device Nasal Cannula  O2 Flow Rate (L/min) 2 L/min  Assess: MEWS Score  MEWS Temp 0  MEWS Systolic 1  MEWS Pulse 1  MEWS RR 0  MEWS LOC 0  MEWS Score 2  MEWS Score Color Yellow  Assess: if the MEWS score is Yellow or Red  Were vital signs accurate and taken at a resting state? Yes  Does the patient meet 2 or more of the SIRS criteria? No  MEWS guidelines implemented  Yes, yellow  Treat  MEWS Interventions Considered administering scheduled or prn medications/treatments as ordered  Take Vital Signs  Increase Vital Sign Frequency  Yellow: Q2hr x1, continue Q4hrs until patient remains green for 12hrs  Escalate  MEWS: Escalate Yellow: Discuss with charge nurse and consider notifying provider and/or RRT  Assess: SIRS CRITERIA  SIRS Temperature  0  SIRS Respirations  0  SIRS Pulse 1  SIRS WBC 0  SIRS Score Sum  1   Pt noted to be yellow MEWS on admission. Dr. Manfred notified.  "

## 2024-06-10 NOTE — ED Triage Notes (Signed)
 Pt comes by EMS for hypoxia and tachycardia. Pt is normally on RA. Facility put her on 2L but it was not in her nose correctly. Pt has a productive cough w/ white creamy colored phlegm. Pt has had s/s for the past 2 days. A&Ox4.    Possible UTI per EMS EMS VS 130/74 116 HR 88% on RA -> 97% on 4L [place] 101.7 temp

## 2024-06-10 NOTE — ED Provider Notes (Signed)
 " [place] [facility] AT [facility] Provider Note   CSN: [id number] Arrival date & time: 06/10/24  8187     Patient presents with: Influenza and Tachycardia   [name] is a 69 y.o. female.  She is brought in by ambulance from her nursing facility.  She is complaining of being sick for 2 days.  Cough productive of clear sputum.  Headache and bodyaches.  Nausea with few episodes of vomiting.  No diarrhea.  Feet hurt, chronic.  Does not walk.  She said her roommate is sick with the flu.  Does not normally require oxygen and sats were low on room air.  Temp of 101.7.  {Add pertinent medical, surgical, social history, OB history to HPI:32947}  Influenza Presenting symptoms: cough, fatigue, fever, headache, myalgias, nausea, shortness of breath and vomiting   Presenting symptoms: no diarrhea   Cough:    Cough characteristics:  Productive   Sputum characteristics:  White   Severity:  Moderate   Onset quality:  Gradual   Duration:  2 days   Timing:  Constant   Progression:  Unchanged   Chronicity:  New Fever:    Max temp PTA:  101.7   Temp source:  Oral   Progression:  Unchanged Associated symptoms: no mental status change and no syncope   Risk factors: sick contacts        Prior to Admission medications  Medication Sig Start Date End Date Taking? Authorizing Provider  acetaminophen  (TYLENOL ) 325 MG tablet Take 650 mg by mouth every 4 (four) hours as needed for mild pain.    [provider]  ascorbic acid  (VITAMIN C ) 500 MG tablet Take 500 mg by mouth 2 (two) times daily.    [provider]  bisacodyl  (DULCOLAX) 10 MG suppository Place 10 mg rectally as needed for moderate constipation.    [provider]  calcium  carbonate (TUMS - DOSED IN MG ELEMENTAL CALCIUM ) 500 MG chewable tablet Chew 1 tablet by mouth every 5 (five) hours as needed for indigestion or heartburn.    [provider]  cetirizine (ZYRTEC) 10 MG tablet  Take 10 mg by mouth daily. For allergies    [provider]  ELIQUIS  5 MG TABS tablet Take 5 mg by mouth 2 (two) times daily. 01/18/23   [provider]  folic acid  (FOLVITE ) 1 MG tablet Take 1 mg by mouth daily.    [provider]  gabapentin  (NEURONTIN ) 400 MG capsule Take 1 capsule (400 mg total) by mouth 3 (three) times daily for 17 days. 07/05/23 07/22/23  Dezii, Alexandra, DO  insulin  aspart (FIASP  FLEXTOUCH) 100 UNIT/ML FlexTouch Pen Inject 0-10 Units into the skin 4 (four) times daily -  before meals and at bedtime. Short acting insulin  (Aspart) per sliding scale 0-10 ---:-- Insulin  injection 0-10 Units 0-10 Units Subcutaneous, 3 times daily with meals CBG 70 - 120: 0 unit CBG 121 - 150: 0 unit  CBG 151 - 200: 1 unit CBG 201 - 250: 2 units CBG 251 - 300: 4 units CBG 301 - 350: 6 units  CBG 351 - 400: 8 units  CBG > 400: 10 units 02/16/23   Emokpae, Courage, MD  Insulin  Glargine (BASAGLAR  KWIKPEN) 100 UNIT/ML Inject 10 Units into the skin daily. 02/22/23   Krishnan, Gokul, MD  LACTOBACILLUS PO Take 1 capsule by mouth daily.    [provider]  lactulose  (CHRONULAC ) 10 GM/15ML solution Take 10 g by mouth daily as needed for  moderate constipation.    [provider]  lactulose  (CHRONULAC ) 10 GM/15ML solution Take 30 mLs (20 g total) by mouth 3 (three) times daily. 07/05/23   Dezii, Alexandra, DO  lidocaine  4 % Place 1 patch onto the skin daily. Remove & Discard patch within 12 hours or as directed by MD; Apply to LOWER LEG Listed as Asperflex Pain Relieving Patch 4% on MAR    [provider]  lithium  600 MG capsule Take 600 mg by mouth daily. 11/24/22   [provider]  lithium  carbonate 300 MG capsule Take 300 mg by mouth at bedtime.    [provider]  loperamide  (IMODIUM  A-D) 2 MG tablet Take 2 mg by mouth every 6 (six) hours as needed for diarrhea or loose stools.    [provider]  lubiprostone  (AMITIZA ) 24 MCG capsule  Take 24 mcg by mouth 2 (two) times daily with a meal.    [provider]  Melatonin 10 MG TABS Take 10 mg by mouth at bedtime.    [provider]  metFORMIN  (GLUCOPHAGE ) 500 MG tablet Take 1 pill a day Patient taking differently: Take 500 mg by mouth daily with breakfast. Take 1 pill a day 12/17/22   Zammit, Joseph, MD  Multiple Vitamin (MULTIVITAMIN ADULT PO) Take 1 tablet by mouth daily.    [provider]  ondansetron  (ZOFRAN ) 4 MG tablet Take 4 mg by mouth every 8 (eight) hours as needed for nausea or vomiting. 12/14/22   [provider]  oxyCODONE -acetaminophen  (PERCOCET) 10-325 MG tablet Take 1 tablet by mouth every 6 (six) hours as needed for pain. Patient taking differently: Take 1 tablet by mouth every 4 (four) hours as needed for pain. 05/07/23   Johnson, Clanford L, MD  phenol (CHLORASEPTIC) 1.4 % LIQD Use as directed 5 sprays in the mouth or throat every 2 (two) hours as needed for throat irritation / pain.    [provider]  polyethylene glycol (MIRALAX  / GLYCOLAX ) 17 g packet Take 17 g by mouth 2 (two) times daily. 05/07/23   Vicci Afton CROME, MD  PRESCRIPTION MEDICATION Apply 1 Application topically in the morning, at noon, in the evening, and at bedtime. Muscle Rub External Cream 10-15% (Menthol-Methyl Salicylate (Liniments))  Apply to feet for pain    [provider]  QUEtiapine  (SEROQUEL ) 300 MG tablet Take 300 mg by mouth at bedtime. 06/22/23   [provider]  QUEtiapine  (SEROQUEL ) 50 MG tablet Take 50 mg by mouth daily.    [provider]  tamsulosin  (FLOMAX ) 0.4 MG CAPS capsule Take 0.4 mg by mouth daily.    [provider]  ARIPiprazole (ABILIFY) 10 MG tablet Take 10 mg by mouth daily.  04/20/23  [provider]    Allergies: Sulfa antibiotics    Review of Systems  Constitutional:  Positive for fatigue and fever.  Respiratory:  Positive for cough and shortness of breath.    Cardiovascular:  Positive for chest pain.  Gastrointestinal:  Positive for nausea and vomiting. Negative for diarrhea.  Musculoskeletal:  Positive for gait problem and myalgias.  Neurological:  Positive for headaches.    Updated Vital Signs BP (!) 128/92   Pulse (!) 108   Temp 100 F (37.8 C) (Oral)   Resp (!) 22   Ht 5' 9 (1.753 m)   Wt 97.5 kg   SpO2 91%   BMI 31.75 kg/m   Physical Exam Vitals and nursing note reviewed.  Constitutional:  General: She is not in acute distress.    Appearance: Normal appearance. She is well-developed.  HENT:     Head: Normocephalic and atraumatic.  Eyes:     Conjunctiva/sclera: Conjunctivae normal.  Cardiovascular:     Rate and Rhythm: Regular rhythm. Tachycardia present.     Heart sounds: No murmur heard. Pulmonary:     Effort: Pulmonary effort is normal. No respiratory distress.     Breath sounds: Normal breath sounds.  Abdominal:     Palpations: Abdomen is soft.     Tenderness: There is no abdominal tenderness. There is no guarding or rebound.  Musculoskeletal:        General: Tenderness present.     Cervical back: Neck supple.     Comments: Both feet diffusely tender.  Skin:    General: Skin is warm and dry.     Capillary Refill: Capillary refill takes less than 2 seconds.  Neurological:     General: No focal deficit present.     Mental Status: She is alert.     (all labs ordered are listed, but only abnormal results are displayed) Labs Reviewed  CULTURE, BLOOD (ROUTINE X 2)  CULTURE, BLOOD (ROUTINE X 2)  RESP PANEL BY RT-PCR (RSV, FLU A&B, COVID)  [id number]  COMPREHENSIVE METABOLIC PANEL WITH GFR  LACTIC ACID, PLASMA  LACTIC ACID, PLASMA  CBC WITH DIFFERENTIAL/PLATELET  PROTIME-INR  URINALYSIS, W/ REFLEX TO CULTURE (INFECTION SUSPECTED)    EKG: None  Radiology: No results found.  {Document cardiac monitor, telemetry assessment procedure when appropriate:32947} Procedures   Medications Ordered in the ED -  No data to display  Clinical Course as of 06/10/24 1856  Tue Jun 10, 2024  1847 Chest x-ray interpreted by me as possible asymmetric edema versus infection.  Awaiting radiology reading. [MB]    Clinical Course User Index [MB] Towana Ozell BROCKS, MD   {Click here for ABCD2, HEART and other calculators REFRESH Note before signing:1}                              Medical Decision Making Amount and/or Complexity of Data Reviewed Labs: ordered. Radiology: ordered.   This patient complains of ***; this involves an extensive number of treatment Options and is a complaint that carries with it a high risk of complications and morbidity. The differential includes ***  I ordered, reviewed and interpreted labs, which included *** I ordered medication *** and reviewed PMP when indicated. I ordered imaging studies which included *** and I independently    visualized and interpreted imaging which showed *** Additional history obtained from *** Previous records obtained and reviewed *** I consulted *** and discussed lab and imaging findings and discussed disposition.  Cardiac monitoring reviewed, *** Social determinants considered, *** Critical Interventions: ***  After the interventions stated above, I reevaluated the patient and found *** Admission and further testing considered, ***   {Document critical care time when appropriate  Document review of labs and clinical decision tools ie CHADS2VASC2, etc  Document your independent review of radiology images and any outside records  Document your discussion with family members, caretakers and with consultants  Document social determinants of health affecting pt's care  Document your decision making why or why not admission, treatments were needed:32947:::1}   Final diagnoses:  None    ED Discharge Orders     None        "

## 2024-06-10 NOTE — H&P (Signed)
 " History and Physical    Patient: [name] FMW:[id number] DOB: May 15, 1956 DOA: 06/10/2024 DOS: the patient was seen and examined on 06/10/2024 PCP: Patient, [name] Per  Patient coming from: SNF [facility]  Chief Complaint:  Chief Complaint  Patient presents with   Tachycardia   HPI: [name] is a 69 y.o. female with medical history significant of hypertension, T2DM, DVT on Eliquis , stage II lung cancer s/p right upper lobe lobectomy (2011), bipolar disorder, wheelchair dependent who presents to the emergency department via EMS for from SNF due to 2-day onset of flulike symptoms including cough with production of thick creamy phlegm, body aches, shortness of breath and headache.  She states that she was exposed to her roommate to had Flu.  Nursing facility placed her on 2 LPM of oxygen.  She also complained of about 2-week onset of burning sensation on urination and increased frequency.  ED course In the emergency department, she was tachycardic, tachypneic and febrile with a temperature at triage being 101.47F.  O2 sat was 88% on room air, she was placed on supplemental oxygen at 4 L via [place] with an O2 sat of 97%.  Workup in the ED showed normal CBC and BMP except for sodium of 132, chloride 95, glucose 352, ALP 198, lactic acid 2.4 > 2.1.  Respiratory panel was negative Chest x-ray showed no acute findings. She was empirically treated with IV ceftriaxone  and azithromycin  for presumed pneumonia.  Breathing treatment was provided, Tylenol  was given due to fever.  TRH was asked to admit patient  Review of Systems: As mentioned in the history of present illness. All other systems reviewed and are negative. Past Medical History:  Diagnosis Date   Allergy    Bipolar disorder (HCC)    Depression    Drug abuse (HCC)    history of, went to rehab 2015   GERD (gastroesophageal reflux disease)    Lung cancer (HCC)    lung ca dx 11/11- right upper lobe   Past Surgical History:   Procedure Laterality Date   ABDOMINAL AORTOGRAM W/LOWER EXTREMITY Bilateral 01/10/2022   Procedure: ABDOMINAL AORTOGRAM W/LOWER EXTREMITY;  Surgeon: Serene Gaile ORN, MD;  Location: [facility];  Service: Cardiovascular;  Laterality: Bilateral;   ANKLE FRACTURE SURGERY Left    HARDWARE REMOVAL Left 12/13/2021   Procedure: LEFT ANKLE HARDWARE REMOVAL;  Surgeon: Sheril Coy, MD;  Location: [facility];  Service: Orthopedics;  Laterality: Left;   LUNG LOBECTOMY Right 2011   RUL removed for lung cancer   STYLOID PROCESS EXCISION Left 10/16/2014   Procedure: EXCISION LEFT STYLOID PROCESS;  Surgeon: Lonni FORBES Angle, MD;  Location: [facility];  Service: ENT;  Laterality: Left;   TONSILLECTOMY Bilateral 10/16/2014   Procedure: BILATERAL TONSILLECTOMY;  Surgeon: Lonni FORBES Angle, MD;  Location: [facility];  Service: ENT;  Laterality: Bilateral;   TUBAL LIGATION  1984   Social History:  reports that she has quit smoking. Her smoking use included cigarettes. She has a 40 pack-year smoking history. She has never used smokeless tobacco. She reports that she does not currently use alcohol. She reports that she does not currently use drugs after having used the following drugs: Benzodiazepines.  Allergies[1]  Family History  Problem Relation Age of Onset   Cancer Mother        breast cancer   Cancer Father        stomach cancer   Diabetes Father    Cancer  Sister        breast cancer   Diabetes Brother    Diabetes Brother    Cancer Sister        breast cancer    Prior to Admission medications  Medication Sig Start Date End Date Taking? Authorizing Provider  acetaminophen  (TYLENOL ) 325 MG tablet Take 650 mg by mouth every 4 (four) hours as needed for mild pain.    [provider]  ascorbic acid  (VITAMIN C ) 500 MG tablet Take 500 mg by mouth 2 (two) times daily.    [provider]  bisacodyl  (DULCOLAX) 10 MG suppository Place 10 mg rectally  as needed for moderate constipation.    [provider]  calcium  carbonate (TUMS - DOSED IN MG ELEMENTAL CALCIUM ) 500 MG chewable tablet Chew 1 tablet by mouth every 5 (five) hours as needed for indigestion or heartburn.    [provider]  cetirizine (ZYRTEC) 10 MG tablet Take 10 mg by mouth daily. For allergies    [provider]  ELIQUIS  5 MG TABS tablet Take 5 mg by mouth 2 (two) times daily. 01/18/23   [provider]  folic acid  (FOLVITE ) 1 MG tablet Take 1 mg by mouth daily.    [provider]  gabapentin  (NEURONTIN ) 400 MG capsule Take 1 capsule (400 mg total) by mouth 3 (three) times daily for 17 days. 07/05/23 07/22/23  Dezii, Alexandra, DO  insulin  aspart (FIASP  FLEXTOUCH) 100 UNIT/ML FlexTouch Pen Inject 0-10 Units into the skin 4 (four) times daily -  before meals and at bedtime. Short acting insulin  (Aspart) per sliding scale 0-10 ---:-- Insulin  injection 0-10 Units 0-10 Units Subcutaneous, 3 times daily with meals CBG 70 - 120: 0 unit CBG 121 - 150: 0 unit  CBG 151 - 200: 1 unit CBG 201 - 250: 2 units CBG 251 - 300: 4 units CBG 301 - 350: 6 units  CBG 351 - 400: 8 units  CBG > 400: 10 units 02/16/23   Emokpae, Courage, MD  Insulin  Glargine (BASAGLAR  KWIKPEN) 100 UNIT/ML Inject 10 Units into the skin daily. 02/22/23   Krishnan, Gokul, MD  LACTOBACILLUS PO Take 1 capsule by mouth daily.    [provider]  lactulose  (CHRONULAC ) 10 GM/15ML solution Take 10 g by mouth daily as needed for moderate constipation.    [provider]  lactulose  (CHRONULAC ) 10 GM/15ML solution Take 30 mLs (20 g total) by mouth 3 (three) times daily. 07/05/23   Dezii, Alexandra, DO  lidocaine  4 % Place 1 patch onto the skin daily. Remove & Discard patch within 12 hours or as directed by MD; Apply to LOWER LEG Listed as Asperflex Pain Relieving Patch 4% on MAR    [provider]  lithium  600 MG capsule Take 600 mg by mouth daily. 11/24/22   [provider]  lithium  carbonate 300 MG capsule Take 300 mg by mouth at bedtime.    [provider]  loperamide  (IMODIUM  A-D) 2 MG tablet Take 2 mg by mouth every 6 (six) hours as needed for diarrhea or loose stools.    [provider]  lubiprostone  (AMITIZA ) 24 MCG capsule Take 24 mcg by mouth 2 (two) times daily with a meal.    [provider]  Melatonin 10 MG TABS Take 10 mg by mouth at bedtime.    [provider]  metFORMIN  (GLUCOPHAGE ) 500 MG tablet Take 1 pill a day Patient taking differently: Take 500 mg by mouth daily with breakfast.  Take 1 pill a day 12/17/22   Zammit, Joseph, MD  Multiple Vitamin (MULTIVITAMIN ADULT PO) Take 1 tablet by mouth daily.    [provider]  ondansetron  (ZOFRAN ) 4 MG tablet Take 4 mg by mouth every 8 (eight) hours as needed for nausea or vomiting. 12/14/22   [provider]  oxyCODONE -acetaminophen  (PERCOCET) 10-325 MG tablet Take 1 tablet by mouth every 6 (six) hours as needed for pain. Patient taking differently: Take 1 tablet by mouth every 4 (four) hours as needed for pain. 05/07/23   Johnson, Clanford L, MD  phenol (CHLORASEPTIC) 1.4 % LIQD Use as directed 5 sprays in the mouth or throat every 2 (two) hours as needed for throat irritation / pain.    [provider]  polyethylene glycol (MIRALAX  / GLYCOLAX ) 17 g packet Take 17 g by mouth 2 (two) times daily. 05/07/23   Vicci Afton CROME, MD  PRESCRIPTION MEDICATION Apply 1 Application topically in the morning, at noon, in the evening, and at bedtime. Muscle Rub External Cream 10-15% (Menthol-Methyl Salicylate (Liniments))  Apply to feet for pain    [provider]  QUEtiapine  (SEROQUEL ) 300 MG tablet Take 300 mg by mouth at bedtime. 06/22/23   [provider]  QUEtiapine  (SEROQUEL ) 50 MG tablet Take 50 mg by mouth daily.    [provider]  tamsulosin  (FLOMAX ) 0.4 MG CAPS capsule Take 0.4 mg by mouth daily.    [provider]  ARIPiprazole (ABILIFY) 10 MG tablet Take 10 mg by mouth daily.  04/20/23  [provider]    Physical Exam: Vitals:   06/10/24 1825 06/10/24 1831 06/10/24 1831 06/10/24 1845  BP: (!) 126/99  (!) 128/92   Pulse: (!) 113  (!) 109 (!) 108  Resp:   18 (!) 22  Temp: 100 F (37.8 C)     TempSrc: Oral     SpO2: 95%  94% 91%  Weight:  97.5 kg    Height:  5' 9 (1.753 m)     General: Elderly female. Awake and alert and oriented x3. Not in any acute distress.  HEENT: NCAT.  PERRLA. EOMI. Sclerae anicteric.  Moist mucosal membranes. Neck: Neck supple without lymphadenopathy. No carotid bruits. No masses palpated.  Cardiovascular: Tachycardia.  Regular rate with normal S1-S2 sounds. No murmurs, rubs or gallops auscultated. No JVD.  Respiratory: Tachypnea.  Clear breath sounds.  No accessory muscle use. Abdomen: Soft, nontender, nondistended. Active bowel sounds. No masses or hepatosplenomegaly  Skin: No rashes, lesions, or ulcerations.  Dry, warm to touch. Musculoskeletal: Right dropfoot TTP.  2+ dorsalis pedis and radial pulses. Good ROM.  No contractures  Psychiatric: Intact judgment and insight.  Mood appropriate to current condition. Neurologic: No focal neurological deficits. Strength is 5/5 x 4.  CN II - XII grossly intact.   Assessment and Plan: Severe sepsis due to presumed CAP POA Patient met sepsis criteria due to being tachypneic, tachycardic, febrile.  Source of infection presumed to be pneumonia.  Lactic acid was elevated. Chest x-ray done not indicative of pneumonia, but patient with similar symptoms at this time IV hydration provided*** Patient was started on ceftriaxone  and azithromycin  in the ED, we shall continue with ceftriaxone  and doxycycline  at this time with plan to de-escalate/discontinue based on blood culture, sputum culture, urine Legionella, strep pneumo  Continue Tylenol  as needed Continue Mucinex , incentive spirometry, flutter valve    Acute respiratory failure with hypoxia in the setting of above Continue supplemental oxygen to maintain O2  sats > 92% with plan to wean patient off this as tolerated.  Patient does not use oxygen at baseline  Lactic acidosis possibly due to sepsis and/or hypoxia  Lactic acid 2.4 > 2.1, continue gentle hydration Continue to trend lactic acid  UTI POA Patient with about 2 weeks of increased urinary frequency and burning sensation on urination.  She states she did not want to tell the nursing facility to avoid Foley catheter since it hurts when placed on her. Urine culture done on 06/28/2023 was positive for Proteus and ESBL E. coli which was sensitive to imipenem but resistant to ceftriaxone  Continue meropenem  Urinalysis and urine culture pending***  Type 2 diabetes mellitus with hyperglycemia A1c on 02/14/2023 was 10.9 Continue Semglee  10 units nightly Continue ISS and hypoglycemia protocol  Pseudohyponatremia Na 132, corrected sodium level based on CBG of 352 = 136 Continue to monitor sodium levels  Obesity class I (BMI 35.29) Diet and lifestyle modification  History of DVT Continue home Eliquis   Major depressive disorder Continue home dose lithium , Seroquel    Foot drop and arthritic pain She takes Percocet at home, but this will be temporarily held due to patient being hypoxic at this time Continue Tylenol  as needed    Advance Care Planning: Full code  Consults: None  Family Communication: None at bedside  Severity of Illness: The appropriate patient status for this patient is INPATIENT. Inpatient status is judged to be reasonable and necessary in order to provide the required intensity of service to ensure the patient's safety. The patient's presenting symptoms, physical exam findings, and initial radiographic and laboratory data in the context of their chronic comorbidities is felt to place them at high risk for further clinical deterioration. Furthermore, it is not  anticipated that the patient will be medically stable for discharge from the hospital within 2 midnights of admission.   * I certify that at the point of admission it is my clinical judgment that the patient will require inpatient hospital care spanning beyond 2 midnights from the point of admission due to high intensity of service, high risk for further deterioration and high frequency of surveillance required.*  Author: Melanye Hiraldo, DO 06/10/2024 8:17 PM  For on call review [email or web address].      [1]  Allergies Allergen Reactions   Sulfa Antibiotics Swelling and Rash    Neck swelling   "

## 2024-06-10 NOTE — ED Notes (Signed)
 Pt pulled her IV out, states, it hurts

## 2024-06-10 NOTE — ED Notes (Addendum)
 Pt given water and a Turkey Sandwich

## 2024-06-11 ENCOUNTER — Other Ambulatory Visit: Payer: Self-pay

## 2024-06-11 ENCOUNTER — Encounter (HOSPITAL_COMMUNITY): Payer: Self-pay | Admitting: Internal Medicine

## 2024-06-11 DIAGNOSIS — E66812 Obesity, class 2: Secondary | ICD-10-CM | POA: Diagnosis not present

## 2024-06-11 DIAGNOSIS — A419 Sepsis, unspecified organism: Secondary | ICD-10-CM | POA: Diagnosis not present

## 2024-06-11 DIAGNOSIS — J9601 Acute respiratory failure with hypoxia: Secondary | ICD-10-CM | POA: Diagnosis not present

## 2024-06-11 DIAGNOSIS — N390 Urinary tract infection, site not specified: Secondary | ICD-10-CM | POA: Diagnosis not present

## 2024-06-11 DIAGNOSIS — J189 Pneumonia, unspecified organism: Secondary | ICD-10-CM | POA: Diagnosis not present

## 2024-06-11 LAB — COMPREHENSIVE METABOLIC PANEL WITH GFR
ALT: 24 U/L (ref 0–44)
AST: 13 U/L — ABNORMAL LOW (ref 15–41)
Albumin: 3.4 g/dL — ABNORMAL LOW (ref 3.5–5.0)
Alkaline Phosphatase: 156 U/L — ABNORMAL HIGH (ref 38–126)
Anion gap: 10 (ref 5–15)
BUN: 7 mg/dL — ABNORMAL LOW (ref 8–23)
CO2: 27 mmol/L (ref 22–32)
Calcium: 8.6 mg/dL — ABNORMAL LOW (ref 8.9–10.3)
Chloride: 102 mmol/L (ref 98–111)
Creatinine, Ser: 0.54 mg/dL (ref 0.44–1.00)
GFR, Estimated: >60 mL/min
Glucose, Bld: 340 mg/dL — ABNORMAL HIGH (ref 70–99)
Potassium: 4.4 mmol/L (ref 3.5–5.1)
Sodium: 138 mmol/L (ref 135–145)
Total Bilirubin: 0.3 mg/dL (ref 0.0–1.2)
Total Protein: 6.2 g/dL — ABNORMAL LOW (ref 6.5–8.1)

## 2024-06-11 LAB — URINALYSIS, W/ REFLEX TO CULTURE (INFECTION SUSPECTED)
Bilirubin Urine: NEGATIVE
Glucose, UA: >=500 mg/dL — AB
Ketones, ur: NEGATIVE mg/dL
Nitrite: POSITIVE — AB
Protein, ur: NEGATIVE mg/dL
Specific Gravity, Urine: 1.008 (ref 1.005–1.030)
pH: 6 (ref 5.0–8.0)

## 2024-06-11 LAB — PHOSPHORUS: Phosphorus: 3.2 mg/dL (ref 2.5–4.6)

## 2024-06-11 LAB — LACTIC ACID, PLASMA
Lactic Acid, Venous: 1.2 mmol/L (ref 0.5–1.9)
Lactic Acid, Venous: 2.1 mmol/L (ref 0.5–1.9)

## 2024-06-11 LAB — CBC
HCT: 38.9 % (ref 36.0–46.0)
Hemoglobin: 12.4 g/dL (ref 12.0–15.0)
MCH: 28.6 pg (ref 26.0–34.0)
MCHC: 31.9 g/dL (ref 30.0–36.0)
MCV: 89.6 fL (ref 80.0–100.0)
Platelets: 143 10*3/uL — ABNORMAL LOW (ref 150–400)
RBC: 4.34 MIL/uL (ref 3.87–5.11)
RDW: 14.5 % (ref 11.5–15.5)
WBC: 3.9 10*3/uL — ABNORMAL LOW (ref 4.0–10.5)
nRBC: 0 % (ref 0.0–0.2)

## 2024-06-11 LAB — GLUCOSE, CAPILLARY
Glucose-Capillary: 308 mg/dL — ABNORMAL HIGH (ref 70–99)
Glucose-Capillary: 279 mg/dL — ABNORMAL HIGH (ref 70–99)
Glucose-Capillary: 225 mg/dL — ABNORMAL HIGH (ref 70–99)
Glucose-Capillary: 305 mg/dL — ABNORMAL HIGH (ref 70–99)

## 2024-06-11 LAB — HEMOGLOBIN A1C
Hgb A1c MFr Bld: 9.2 % — ABNORMAL HIGH (ref 4.8–5.6)
Mean Plasma Glucose: 217.34 mg/dL

## 2024-06-11 LAB — STREP PNEUMONIAE URINARY ANTIGEN: Strep Pneumo Urinary Antigen: NEGATIVE

## 2024-06-11 LAB — MAGNESIUM: Magnesium: 1.9 mg/dL (ref 1.7–2.4)

## 2024-06-11 MED ORDER — LACTATED RINGERS IV SOLN: INTRAVENOUS | Status: AC

## 2024-06-11 MED ORDER — MILK AND MOLASSES ENEMA
1.0000 | Freq: Once | RECTAL | Status: AC
  Administered 2024-06-11: 150 mL via RECTAL

## 2024-06-11 MED ORDER — LITHIUM CARBONATE 150 MG PO CAPS: 300.0000 mg | ORAL_CAPSULE | Freq: Every day | ORAL | Status: DC

## 2024-06-11 MED ORDER — LORATADINE 10 MG PO TABS
10.0000 mg | ORAL_TABLET | Freq: Every day | ORAL | Status: DC
  Administered 2024-06-11 – 2024-06-13 (×3): 10 mg via ORAL
  Filled 2024-06-11 (×3): qty 1

## 2024-06-11 MED ORDER — LUBIPROSTONE 24 MCG PO CAPS
24.0000 ug | ORAL_CAPSULE | Freq: Two times a day (BID) | ORAL | Status: DC
  Administered 2024-06-11 – 2024-06-13 (×5): 24 ug via ORAL
  Filled 2024-06-11 (×5): qty 1

## 2024-06-11 MED ORDER — OXYCODONE-ACETAMINOPHEN 10-325 MG PO TABS: 1.0000 | ORAL_TABLET | Freq: Four times a day (QID) | ORAL | Status: DC | PRN

## 2024-06-11 MED ORDER — LITHIUM CARBONATE 150 MG PO CAPS
450.0000 mg | ORAL_CAPSULE | Freq: Every day | ORAL | Status: DC
  Administered 2024-06-12 – 2024-06-13 (×3): 450 mg via ORAL
  Filled 2024-06-11 (×4): qty 3

## 2024-06-11 MED ORDER — OXYCODONE-ACETAMINOPHEN 5-325 MG PO TABS
1.0000 | ORAL_TABLET | Freq: Four times a day (QID) | ORAL | Status: DC | PRN
  Administered 2024-06-11 – 2024-06-13 (×10): 1 via ORAL
  Filled 2024-06-11 (×10): qty 1

## 2024-06-11 MED ORDER — FOLIC ACID 1 MG PO TABS
1.0000 mg | ORAL_TABLET | Freq: Every day | ORAL | Status: DC
  Administered 2024-06-11 – 2024-06-13 (×3): 1 mg via ORAL
  Filled 2024-06-11 (×3): qty 1

## 2024-06-11 MED ORDER — OXYCODONE-ACETAMINOPHEN 5-325 MG PO TABS
1.0000 | ORAL_TABLET | Freq: Once | ORAL | Status: AC | PRN
  Administered 2024-06-11: 1 via ORAL
  Filled 2024-06-11: qty 1

## 2024-06-11 MED ORDER — APIXABAN 5 MG PO TABS
5.0000 mg | ORAL_TABLET | Freq: Two times a day (BID) | ORAL | Status: DC
  Administered 2024-06-11 – 2024-06-13 (×6): 5 mg via ORAL
  Filled 2024-06-11 (×6): qty 1

## 2024-06-11 MED ORDER — MELATONIN 3 MG PO TABS
3.0000 mg | ORAL_TABLET | Freq: Every evening | ORAL | Status: DC | PRN
  Administered 2024-06-11 – 2024-06-12 (×2): 3 mg via ORAL
  Filled 2024-06-11 (×2): qty 1

## 2024-06-11 MED ORDER — GABAPENTIN 100 MG PO CAPS
100.0000 mg | ORAL_CAPSULE | Freq: Every day | ORAL | Status: DC
  Administered 2024-06-11 – 2024-06-13 (×3): 100 mg via ORAL
  Filled 2024-06-11 (×4): qty 1

## 2024-06-11 MED ORDER — POLYETHYLENE GLYCOL 3350 17 G PO PACK
17.0000 g | PACK | Freq: Every day | ORAL | Status: DC
  Administered 2024-06-11 – 2024-06-13 (×3): 17 g via ORAL
  Filled 2024-06-11 (×3): qty 1

## 2024-06-11 MED ORDER — TAMSULOSIN HCL 0.4 MG PO CAPS
0.4000 mg | ORAL_CAPSULE | Freq: Every day | ORAL | Status: DC
  Administered 2024-06-11 – 2024-06-13 (×3): 0.4 mg via ORAL
  Filled 2024-06-11 (×3): qty 1

## 2024-06-11 MED ORDER — GABAPENTIN 300 MG PO CAPS: 300.0000 mg | ORAL_CAPSULE | Freq: Three times a day (TID) | ORAL | Status: DC

## 2024-06-11 MED ORDER — LACTULOSE 10 GM/15ML PO SOLN
20.0000 g | Freq: Every day | ORAL | Status: DC | PRN
  Administered 2024-06-11: 20 g via ORAL
  Filled 2024-06-11: qty 30

## 2024-06-11 MED ORDER — QUETIAPINE FUMARATE ER 50 MG PO TB24
200.0000 mg | ORAL_TABLET | Freq: Every day | ORAL | Status: DC
  Administered 2024-06-11 – 2024-06-13 (×3): 200 mg via ORAL
  Filled 2024-06-11 (×3): qty 4

## 2024-06-11 MED ORDER — DICLOFENAC SODIUM 1 % EX GEL
2.0000 g | Freq: Four times a day (QID) | CUTANEOUS | Status: DC
  Administered 2024-06-11 – 2024-06-13 (×6): 2 g via TOPICAL
  Filled 2024-06-11: qty 100

## 2024-06-11 MED ORDER — FLUTICASONE PROPIONATE 50 MCG/ACT NA SUSP
1.0000 | Freq: Every day | NASAL | Status: DC
  Administered 2024-06-11 – 2024-06-13 (×2): 1 via NASAL
  Filled 2024-06-11: qty 16

## 2024-06-11 MED ORDER — LITHIUM CARBONATE 150 MG PO CAPS
600.0000 mg | ORAL_CAPSULE | Freq: Every day | ORAL | Status: DC
  Administered 2024-06-11 – 2024-06-13 (×3): 600 mg via ORAL
  Filled 2024-06-11 (×4): qty 4

## 2024-06-11 MED ORDER — LACTULOSE 10 GM/15ML PO SOLN: 20.0000 g | Freq: Three times a day (TID) | ORAL | Status: DC

## 2024-06-11 MED ORDER — NYSTATIN 100000 UNIT/GM EX POWD
Freq: Two times a day (BID) | CUTANEOUS | Status: DC
  Filled 2024-06-11: qty 15

## 2024-06-11 MED ORDER — OXYCODONE HCL 5 MG PO TABS
5.0000 mg | ORAL_TABLET | Freq: Four times a day (QID) | ORAL | Status: DC | PRN
  Administered 2024-06-11 – 2024-06-13 (×10): 5 mg via ORAL
  Filled 2024-06-11 (×10): qty 1

## 2024-06-11 MED ORDER — QUETIAPINE FUMARATE 100 MG PO TABS: 300.0000 mg | ORAL_TABLET | Freq: Every day | ORAL | Status: DC

## 2024-06-11 MED ORDER — SODIUM CHLORIDE 0.9 % IV SOLN
1.0000 g | Freq: Three times a day (TID) | INTRAVENOUS | Status: DC
  Administered 2024-06-11 – 2024-06-13 (×6): 1 g via INTRAVENOUS
  Filled 2024-06-11 (×7): qty 20

## 2024-06-11 NOTE — TOC Initial Note (Signed)
 Transition of Care [facility]) - Initial/Assessment Note    Patient Details  Name: [name] MRN: [id number] Date of Birth: [birth year]  Transition of Care [facility]) CM/SW Contact:    Hoy DELENA Bigness, LCSW Phone Number: 06/11/2024, 11:20 AM  Clinical Narrative:                 Pt is a LTC resident at [facility]. Pt uses wheelchair for mobility at baseline and requires assistance with ADL's. Pt is not on O2 at baseline however, has been requiring O2 over the past few days. Pt confirms plan to return to [facility] at discharge. ICM will continue to follow.   Expected Discharge Plan: Long Term Nursing Home Barriers to Discharge: Continued Medical Work up   Patient Goals and CMS Choice Patient states their goals for this hospitalization and ongoing recovery are:: To [facility] to [facility].gov Compare Post Acute Care list provided to:: Patient Choice offered to / list presented to : Patient      Expected Discharge Plan and Services In-house Referral: Clinical Social Work Discharge Planning Services: NA Post Acute Care Choice: Resumption of Svcs/PTA Provider, Nursing Home Living arrangements for the past 2 months: Skilled Nursing Facility                 DME Arranged: N/A DME Agency: NA                  Prior Living Arrangements/Services Living arrangements for the past 2 months: Skilled Nursing Facility Lives with:: Facility Resident Patient language and need for interpreter reviewed:: Yes Do you feel safe going back to the place where you live?: Yes      Need for Family Participation in Patient Care: No (Comment) Care giver support system in place?: Yes (comment) 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) Criminal Activity/Legal Involvement Pertinent to Current Situation/Hospitalization: No - Comment as needed  Activities of Daily Living   ADL Screening (condition at time of admission) Independently performs ADLs?: No Does the patient have a NEW difficulty with  bathing/dressing/toileting/self-feeding that is expected to last >3 days?: No Does the patient have a NEW difficulty with getting in/out of bed, walking, or climbing stairs that is expected to last >3 days?: No Does the patient have a NEW difficulty with communication that is expected to last >3 days?: No Is the patient deaf or have difficulty hearing?: No Does the patient have difficulty seeing, even when wearing glasses/contacts?: No Does the patient have difficulty concentrating, remembering, or making decisions?: No  Permission Sought/Granted Permission sought to share information with : Facility Industrial/product Designer granted to share information with : Yes, Verbal Permission Granted     Permission granted to share info w AGENCY: [facility]        Emotional Assessment Appearance:: Appears stated age Attitude/Demeanor/Rapport: Engaged Affect (typically observed): Accepting Orientation: : Oriented to Self, Oriented to Place, Oriented to  Time, Oriented to Situation Alcohol / Substance Use: Not Applicable Psych Involvement: No (comment)  Admission diagnosis:  Sepsis due to pneumonia (HCC) [J18.9, A41.9] Patient Active Problem List   Diagnosis Date Noted   Sepsis due to pneumonia (HCC) 06/10/2024   Ambulatory dysfunction 07/19/2023   Recurrent UTI 07/19/2023   History of urinary retention 07/19/2023   Opioid-induced constipation 07/19/2023   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   Abscess of left great toe 05/22/2023   Cellulitis of left toe 05/21/2023   E coli bacteremia 05/04/2023   UTI due to extended-spectrum beta lactamase (ESBL) producing  Escherichia coli 02/18/2023   HTN (hypertension) 02/18/2023   Controlled type 2 diabetes mellitus with hyperglycemia (HCC) 02/14/2023   DVT (deep venous thrombosis) 09/08/22 09/08/2022   Hypernatremia 07/30/2021   Hypokalemia 07/30/2021   Hypotension 07/29/2021   Dyslipidemia 07/28/2021   Bipolar disorder, current episode  mixed, moderate (HCC) 07/04/2021   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   Vitamin B 12 deficiency 08/29/2018   Vitamin D deficiency 08/29/2018   Bipolar disorder, current episode depressed, severe, with psychotic features (HCC) 08/24/2018   MDD (major depressive disorder), recurrent severe, without psychosis (HCC) 11/14/2017   Severe bipolar I disorder, most recent episode depressed (HCC)    Heavy tobacco smoker 04/28/2015   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 [place] [address] Phone: [phone] Fax: [phone]     Social Drivers of Health (SDOH) Social History: SDOH Screenings   Food Insecurity: No Food Insecurity (06/11/2024)  Housing: Low Risk (06/11/2024)  Transportation Needs: No Transportation Needs (06/11/2024)  Utilities: Not At Risk (06/11/2024)  Social Connections: Socially Isolated (06/11/2024)  Tobacco Use: Medium Risk (06/11/2024)   SDOH Interventions: Social Connections Interventions: Inpatient TOC, Intervention Not Indicated   Readmission Risk Interventions    06/11/2024   11:18 AM 06/29/2023   10:37 AM 05/24/2023    4:41 PM  Readmission Risk Prevention Plan  Transportation Screening Complete Complete Complete  Medication Review Oceanographer) Complete Complete Complete  PCP or Specialist appointment within 3-5 days of discharge Complete    HRI or Home Care Consult Complete Complete Complete  SW Recovery Care/Counseling Consult Complete Complete Complete  Palliative Care Screening Not Applicable Not Applicable Not Applicable  Skilled Nursing Facility Complete Complete Complete

## 2024-06-11 NOTE — Progress Notes (Signed)
 This RN entered pt room to stop LR bolus upon completion and obtain post-bolus VS/q2 yellow MEWS VS. This RN detatched pts IV and attached BP cuff. Upon attachment of BP cuff to RUE, pt reported pain. This RN apologized, removed cuff, checked skin for breakdown, found none, and replaced cuff. Upon beginning the BP check, pt noted to be flailing in the bed shouting that's enough, that's enough. This RN stopped the BP check and provided education about purpose of BP check. Pt agreed to continue. This RN restarted the dinamap and at the same time the dinamap peaked, the pt began flailing in the bed again shouting enough, enough enough. I explained to the pt that the squeezing was over and it would loosen gradually at this point, but the more movement the pt continued to do, the less accurate the reading would be. Pt then reached for the BP cuff to take it off and I reached for her hand stating one more second. At this time pt shouted You will not hit me, you will not man handle me. This RN then stepped out of the room within eyesight of the nurse tech while the pt continued screaming that this RN was hitting her. When the dinamap beeped signaling completion, this RN re-entered the room, removed the dinamap from the pt leaving the BP cuff on and exited the room.

## 2024-06-11 NOTE — Progress Notes (Signed)
 " PROGRESS NOTE    [name]  FMW:[id number] DOB: 02-03-1956 DOA: 06/10/2024 PCP: Patient, [name] Per    Chief Complaint  Patient presents with   Tachycardia    Brief Narrative: As per H&P written by Dr. Manfred on 06/10/2024 [name] is a 69 y.o. female with medical history significant of hypertension, T2DM, DVT on Eliquis , stage II lung cancer s/p right upper lobe lobectomy (2011), bipolar disorder, wheelchair dependent who presents to the emergency department via EMS for from SNF due to 2-day onset of flulike symptoms including cough with production of thick creamy phlegm, body aches, shortness of breath and headache.  She states that she was exposed to her roommate to had Flu.  Nursing facility placed her on 2 LPM of oxygen.  She also complained of about 2-week onset of burning sensation on urination and increased frequency.   ED course In the emergency department, she was tachycardic, tachypneic and febrile with a temperature at triage being 101.73F.  O2 sat was 88% on room air, she was placed on supplemental oxygen at 4 L via [place] with an O2 sat of 97%.  Workup in the ED showed normal CBC and BMP except for sodium of 132, chloride 95, glucose 352, ALP 198, lactic acid 2.4 > 2.1.  Respiratory panel was negative Chest x-ray showed no acute findings. She was empirically treated with IV ceftriaxone  and azithromycin  for presumed pneumonia.  Breathing treatment was provided, Tylenol  was given due to fever.  TRH was asked to admit patient  Assessment & Plan: 1-sepsis due to presumed pneumonia and UTI - Continue maintaining adequate hydration - Continue current IV antibiotics and follow culture - Continue supportive care and follow clinical response  2-acute respiratory failure with hypoxia - Appears to be associated with pneumonia as mentioned above - Continue to wean off oxygen supplementation as tolerated - Continue current antibiotics.  3-lactic acidosis - 2.4 at time of  admission; repeat levels after fluid initiated demonstrated 2.1 cm.  4-UTI - Patient with prior history of ESBL - Continue treatment with meropenem  - Maintain adequate hydration - Follow final speciation and culture results.  5-type 2 diabetes mellitus with hyperglycemia - Most recent A1c 8.9 - Continue the use of long-acting insulin  and sliding scale  6-pseudohyponatremia - In the setting of hyperglycemia - Maintain adequate hydration - Follow clinical response.  7-class II obesity -Body mass index is 35.29 kg/m.  -Low-calorie diet and portion control discussed with patient.  8-history of DVT - Continue treatment with Eliquis .  9-foot drop/chronic pain - Continue home analgesic therapy and the use of Neurontin  - Follow clinical response and adjust medication as needed.  10-upper airway congestion Rhinitis - Loratadine  and Flonase  has been started - Follow clinical response.    DVT prophylaxis: Chronically on Eliquis  Code Status: DNR Family Communication: No family at bedside. Disposition:   Status is: Inpatient Remains inpatient appropriate because: Continue with therapy   Consultants:  None  Procedures:  See below for x-ray reports.  Antimicrobials:  Meropenem  and doxycycline   Subjective: Afebrile, no chest pain, no nausea or vomiting.  Complaining of pain on his legs (chronic); demonstrating upper airway congestion and 2 L supplementation.  Objective: Vitals:   06/10/24 2157 06/10/24 2257 06/11/24 0047 06/11/24 0522  BP: (!) 90/55  138/62 112/66  Pulse: (!) 108  100 98  Resp: 16   16  Temp: 100 F (37.8 C)  99.8 F (37.7 C) 99.8 F (37.7 C)  TempSrc: Oral  Oral  Oral  SpO2: 94% 95% 95% 99%  Weight: 108.4 kg     Height:        Intake/Output Summary (Last 24 hours) at 06/11/2024 1051 Last data filed at 06/11/2024 0522 Gross per 24 hour  Intake 1000.41 ml  Output 500 ml  Net 500.41 ml   Filed Weights   06/10/24 1831 06/10/24 2157  Weight:  97.5 kg 108.4 kg    Examination:  General exam: Appears calm and in no major distress. Respiratory system: Positive rhonchi bilaterally; upper airway congestion appreciated.  2 L nasal in place.  No using accessory muscles. Cardiovascular system: S1 & S2 heard, no rubs, no gallops, no JVD. Gastrointestinal system: Abdomen is obese, nondistended, soft and nontender. No organomegaly or masses felt.  Positive bowel sounds. Central nervous system: Alert and oriented. No focal neurological deficits. Extremities: Cyanosis or clubbing Skin: No petechiae. Psychiatry: Judgement and insight appear normal.  Flat affect appreciated on exam.    Data Reviewed: I have personally reviewed following labs and imaging studies  CBC: Recent Labs  Lab 06/10/24 1828 06/11/24 0234  WBC 6.9 3.9*  NEUTROABS 6.1  --   HGB 13.7 12.4  HCT 43.0 38.9  MCV 90.1 89.6  PLT 164 143*    Basic Metabolic Panel: Recent Labs  Lab 06/10/24 1828 06/11/24 0234  NA 132* 138  K 4.0 4.4  CL 95* 102  CO2 22 27  GLUCOSE 352* 340*  BUN 5* 7*  CREATININE 0.61 0.54  CALCIUM  9.0 8.6*  MG  --  1.9  PHOS  --  3.2    GFR: Estimated Creatinine Clearance: 89.5 mL/min (by C-G formula based on SCr of 0.54 mg/dL).  Liver Function Tests: Recent Labs  Lab 06/10/24 1828 06/11/24 0234  AST 17 13*  ALT 30 24  ALKPHOS 198* 156*  BILITOT 0.4 0.3  PROT 7.4 6.2*  ALBUMIN 3.9 3.4*    CBG: Recent Labs  Lab 06/11/24 0730  GLUCAP 308*     Recent Results (from the past 240 hours)  Resp panel by RT-PCR (RSV, Flu A&B, Covid) Anterior Nasal Swab     Status: None   Collection Time: 06/10/24  6:28 PM   Specimen: Anterior Nasal Swab  Result Value Ref Range Status   SARS Coronavirus 2 by RT PCR NEGATIVE NEGATIVE Final    Comment: (NOTE) SARS-CoV-2 target nucleic acids are NOT DETECTED.  The SARS-CoV-2 RNA is generally detectable in upper respiratory specimens during the acute phase of infection. The  lowest concentration of SARS-CoV-2 viral copies this assay can detect is 138 copies/mL. A negative result does not preclude SARS-Cov-2 infection and should not be used as the sole basis for treatment or other patient management decisions. A negative result may occur with  improper specimen collection/handling, submission of specimen other than nasopharyngeal swab, presence of viral mutation(s) within the areas targeted by this assay, and inadequate number of viral copies(<138 copies/mL). A negative result must be combined with clinical observations, patient history, and epidemiological information. The expected result is Negative.  Fact Sheet for Patients:  bloggercourse.com  Fact Sheet for Healthcare Providers:  seriousbroker.it  This test is no t yet approved or cleared by the [place]  FDA and  has been authorized for detection and/or diagnosis of SARS-CoV-2 by FDA under an Emergency Use Authorization (EUA). This EUA will remain  in effect (meaning this test can be used) for the duration of the COVID-19 declaration under Section 564(b)(1) of the Act, 21 U.S.C.section 360bbb-3(b)(1), unless the  authorization is terminated  or revoked sooner.       Influenza A by PCR NEGATIVE NEGATIVE Final   Influenza B by PCR NEGATIVE NEGATIVE Final    Comment: (NOTE) The Xpert Xpress SARS-CoV-2/FLU/RSV plus assay is intended as an aid in the diagnosis of influenza from Nasopharyngeal swab specimens and should not be used as a sole basis for treatment. Nasal washings and aspirates are unacceptable for Xpert Xpress SARS-CoV-2/FLU/RSV testing.  Fact Sheet for Patients: bloggercourse.com  Fact Sheet for Healthcare Providers: seriousbroker.it  This test is not yet approved or cleared by the [place]  FDA and has been authorized for detection and/or diagnosis of SARS-CoV-2 by FDA under  an Emergency Use Authorization (EUA). This EUA will remain in effect (meaning this test can be used) for the duration of the COVID-19 declaration under Section 564(b)(1) of the Act, 21 U.S.C. section 360bbb-3(b)(1), unless the authorization is terminated or revoked.     Resp Syncytial Virus by PCR NEGATIVE NEGATIVE Final    Comment: (NOTE) Fact Sheet for Patients: bloggercourse.com  Fact Sheet for Healthcare Providers: seriousbroker.it  This test is not yet approved or cleared by the [place]  FDA and has been authorized for detection and/or diagnosis of SARS-CoV-2 by FDA under an Emergency Use Authorization (EUA). This EUA will remain in effect (meaning this test can be used) for the duration of the COVID-19 declaration under Section 564(b)(1) of the Act, 21 U.S.C. section 360bbb-3(b)(1), unless the authorization is terminated or revoked.  Performed at [facility], [address], [place], [place] [address]   Culture, blood (Routine x 2)     Status: None (Preliminary result)   Collection Time: 06/10/24  6:33 PM   Specimen: BLOOD  Result Value Ref Range Status   Specimen Description BLOOD RIGHT ANTECUBITAL  Final   Special Requests   Final    BOTTLES DRAWN AEROBIC AND ANAEROBIC Blood Culture adequate volume   Culture   Final    NO GROWTH < 12 HOURS Performed at [facility], [address] [place], [place] [address]    Report Status PENDING  Incomplete  Culture, blood (Routine x 2)     Status: None (Preliminary result)   Collection Time: 06/10/24  6:41 PM   Specimen: BLOOD  Result Value Ref Range Status   Specimen Description BLOOD BLOOD RIGHT WRIST  Final   Special Requests   Final    BOTTLES DRAWN AEROBIC AND ANAEROBIC Blood Culture adequate volume   Culture   Final    NO GROWTH < 12 HOURS Performed at [facility], [address] [place], [place] [address]    Report Status PENDING  Incomplete          Radiology Studies: DG Chest Port 1 View if patient is in a treatment room. Result Date: 06/10/2024 EXAM: 1 VIEW(S) XRAY OF THE CHEST 06/10/2024 06:44:29 PM COMPARISON: None available. CLINICAL HISTORY: Suspected sepsis. FINDINGS: LUNGS AND PLEURA: Low lung volumes. No focal pulmonary opacity. No pleural effusion. No pneumothorax. HEART AND MEDIASTINUM: No acute abnormality of the cardiac and mediastinal silhouettes. BONES AND SOFT TISSUES: No acute osseous abnormality. IMPRESSION: 1. No acute findings. 2. Low lung volumes. Electronically signed by: Elsie Gravely MD 06/10/2024 07:18 PM EST RP Workstation: [id number]        Scheduled Meds:  apixaban   5 mg Oral BID   dextromethorphan -guaiFENesin   1 tablet Oral BID   folic acid   1 mg Oral Daily   gabapentin   300 mg Oral TID   insulin   aspart  0-15 Units Subcutaneous TID WC   insulin  aspart  0-5 Units Subcutaneous QHS   insulin  glargine-yfgn  10 Units Subcutaneous QHS   lactulose   20 g Oral TID   lithium  carbonate  300 mg Oral QHS   lithium   600 mg Oral Daily   lubiprostone   24 mcg Oral BID WC   polyethylene glycol  17 g Oral Daily   QUEtiapine   300 mg Oral QHS   tamsulosin   0.4 mg Oral Daily   Continuous Infusions:  doxycycline  (VIBRAMYCIN ) IV     lactated ringers      meropenem  (MERREM ) IV 1 g (06/11/24 1028)     LOS: 1 day    Time spent: 50 minutes    Eric Nunnery, MD [facility]   To contact the attending provider between 7A-7P or the covering provider during after hours 7P-7A, please log into the web site [email or web address] and access using universal [place] password for that web site. If you do not have the password, please call the hospital operator.  06/11/2024, 10:51 AM    "

## 2024-06-11 NOTE — Plan of Care (Signed)

## 2024-06-11 NOTE — NC FL2 (Signed)
 " [place]  MEDICAID FL2 LEVEL OF CARE FORM     IDENTIFICATION  Patient Name: [name] Birthdate: Sep 11, 1955 Sex: female Admission Date (Current Location): 06/10/2024  [place] and [place] Number:  [place] [id number] Facility and Address:  [facility],  [address] [place] [address]      Provider Number: [id number]  Attending Physician Name and Address:  Ricky Fines, MD  Relative Name and Phone Number:  Ethyl Eleanor Ahumada, Emergency Contact  [phone]    Current Level of Care: Hospital Recommended Level of Care: Nursing Facility Prior Approval Number:    Date Approved/Denied:   PASRR Number:    Discharge Plan: SNF    Current Diagnoses: Patient Active Problem List   Diagnosis Date Noted   Sepsis due to pneumonia (HCC) 06/10/2024   Ambulatory dysfunction 07/19/2023   Recurrent UTI 07/19/2023   History of urinary retention 07/19/2023   Opioid-induced constipation 07/19/2023   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   Abscess of left great toe 05/22/2023   Cellulitis of left toe 05/21/2023   E coli bacteremia 05/04/2023   UTI due to extended-spectrum beta lactamase (ESBL) producing Escherichia coli 02/18/2023   HTN (hypertension) 02/18/2023   Controlled type 2 diabetes mellitus with hyperglycemia (HCC) 02/14/2023   DVT (deep venous thrombosis) 09/08/22 09/08/2022   Hypernatremia 07/30/2021   Hypokalemia 07/30/2021   Hypotension 07/29/2021   Dyslipidemia 07/28/2021   Bipolar disorder, current episode mixed, moderate (HCC) 07/04/2021   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   Vitamin B 12 deficiency 08/29/2018   Vitamin D deficiency 08/29/2018   Bipolar disorder, current episode depressed, severe, with psychotic features (HCC) 08/24/2018   MDD (major depressive disorder), recurrent severe, without psychosis (HCC) 11/14/2017   Severe bipolar I disorder, most recent episode depressed (HCC)    Heavy tobacco smoker 04/28/2015   Eagle's syndrome  03/09/2015   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/2015   Narcotic abuse (HCC) 01/01/2015   Lung cancer (HCC) 04/24/2011    Orientation RESPIRATION BLADDER Height & Weight     Self, Time, Situation, Place  O2 (2L) Continent, External catheter Weight: 238 lb 15.7 oz (108.4 kg) Height:  5' 9 (175.3 cm)  BEHAVIORAL SYMPTOMS/MOOD NEUROLOGICAL BOWEL NUTRITION STATUS      Continent Diet (Heart healthy/carb modified)  AMBULATORY STATUS COMMUNICATION OF NEEDS Skin   Extensive Assist Verbally Normal                       Personal Care Assistance Level of Assistance  Bathing, Dressing, Feeding Bathing Assistance: Maximum assistance Feeding assistance: Limited assistance Dressing Assistance: Maximum assistance     Functional Limitations Info  Sight, Hearing, Speech Sight Info: Impaired Hearing Info: Adequate Speech Info: Adequate    SPECIAL CARE FACTORS FREQUENCY                       Contractures Contractures Info: Not present    Additional Factors Info  Code Status, Allergies, Psychotropic Code Status Info: DNR Allergies Info: Sulfa Antibiotics Psychotropic Info: Seroquel          Current Medications (06/11/2024):  This is the current hospital active medication list Current Facility-Administered Medications  Medication Dose Route Frequency Provider Last Rate Last Admin   acetaminophen  (TYLENOL ) tablet 650 mg  650 mg Oral Q6H PRN Adefeso, Oladapo, DO       Or   acetaminophen  (TYLENOL ) suppository 650 mg  650 mg Rectal Q6H PRN Adefeso, Oladapo, DO  apixaban  (ELIQUIS ) tablet 5 mg  5 mg Oral BID Adefeso, Oladapo, DO   5 mg at 06/11/24 9479   dextromethorphan -guaiFENesin  (MUCINEX  DM) 30-600 MG per 12 hr tablet 1 tablet  1 tablet Oral BID Adefeso, Oladapo, DO   1 tablet at 06/11/24 1014   doxycycline  (VIBRAMYCIN ) 100 mg in sodium chloride  0.9 % 250 mL IVPB  100 mg Intravenous Q12H Adefeso, Oladapo, DO       folic acid  (FOLVITE ) tablet 1 mg  1 mg Oral  Daily Ricky Fines, MD       gabapentin  (NEURONTIN ) capsule 300 mg  300 mg Oral TID Ricky Fines, MD       insulin  aspart (novoLOG ) injection 0-15 Units  0-15 Units Subcutaneous TID WC Adefeso, Oladapo, DO   11 Units at 06/11/24 1014   insulin  aspart (novoLOG ) injection 0-5 Units  0-5 Units Subcutaneous QHS Adefeso, Oladapo, DO       insulin  glargine-yfgn injection 10 Units  10 Units Subcutaneous QHS Adefeso, Oladapo, DO       lactated ringers  infusion   Intravenous Continuous Adefeso, Oladapo, DO       lactulose  (CHRONULAC ) 10 GM/15ML solution 20 g  20 g Oral TID Ricky Fines, MD       lithium  carbonate capsule 300 mg  300 mg Oral QHS Adefeso, Oladapo, DO       lithium  carbonate capsule 600 mg  600 mg Oral Daily Adefeso, Oladapo, DO       lubiprostone  (AMITIZA ) capsule 24 mcg  24 mcg Oral BID WC Ricky Fines, MD       meropenem  (MERREM ) 1 g in sodium chloride  0.9 % 100 mL IVPB  1 g Intravenous Q8H Ledford, James L, RPH 200 mL/hr at 06/11/24 1028 1 g at 06/11/24 1028   ondansetron  (ZOFRAN ) tablet 4 mg  4 mg Oral Q6H PRN Adefeso, Oladapo, DO       Or   ondansetron  (ZOFRAN ) injection 4 mg  4 mg Intravenous Q6H PRN Adefeso, Oladapo, DO       oxyCODONE -acetaminophen  (PERCOCET/ROXICET) 5-325 MG per tablet 1 tablet  1 tablet Oral Q6H PRN Ricky Fines, MD       And   oxyCODONE  (Oxy IR/ROXICODONE ) immediate release tablet 5 mg  5 mg Oral Q6H PRN Ricky Fines, MD       polyethylene glycol (MIRALAX  / GLYCOLAX ) packet 17 g  17 g Oral Daily Ricky Fines, MD       QUEtiapine  (SEROQUEL ) tablet 300 mg  300 mg Oral QHS Adefeso, Oladapo, DO       tamsulosin  (FLOMAX ) capsule 0.4 mg  0.4 mg Oral Daily Ricky Fines, MD         Discharge Medications: Please see discharge summary for a list of discharge medications.  Relevant Imaging Results:  Relevant Lab Results:   Additional Information SSN: [id number]  Hoy DELENA Bigness, LCSW     "

## 2024-06-11 NOTE — Inpatient Diabetes Management (Signed)
 Inpatient Diabetes Program Recommendations  AACE/ADA: New Consensus Statement on Inpatient Glycemic Control   Target Ranges:  Prepandial:   less than 140 mg/dL      Peak postprandial:   less than 180 mg/dL (1-2 hours)      Critically ill patients:  140 - 180 mg/dL    Latest Reference Range & Units 06/11/24 07:30  Glucose-Capillary 70 - 99 mg/dL 691 (H)    Latest Reference Range & Units 06/10/24 18:28 06/11/24 02:34  Glucose 70 - 99 mg/dL 647 (H) 659 (H)   Review of Glycemic Control  Diabetes history: DM2 Outpatient Diabetes medications: Basaglar  10 units daily, Fiasp  0-10 units QID, Metformin  500 mg QAM Current orders for Inpatient glycemic control: Semglee  10 units at bedtime, Novolog  0-15 units TID with meals, Novolog  0-5 units QHS  Inpatient Diabetes Program Recommendations:     Insulin : No Novolog  or Semglee  given since arrival. Per MAR, patient refused CBG check last night so bedtime Novolog  and Semglee  were held.  Thanks, Earnie Gainer, RN, MSN, CDCES Diabetes Coordinator Inpatient Diabetes Program [phone] (Team Pager from 8am to 5pm)

## 2024-06-12 DIAGNOSIS — J9601 Acute respiratory failure with hypoxia: Secondary | ICD-10-CM | POA: Diagnosis not present

## 2024-06-12 DIAGNOSIS — J189 Pneumonia, unspecified organism: Secondary | ICD-10-CM | POA: Diagnosis not present

## 2024-06-12 DIAGNOSIS — A419 Sepsis, unspecified organism: Secondary | ICD-10-CM | POA: Diagnosis not present

## 2024-06-12 DIAGNOSIS — N390 Urinary tract infection, site not specified: Secondary | ICD-10-CM | POA: Diagnosis not present

## 2024-06-12 DIAGNOSIS — E66812 Obesity, class 2: Secondary | ICD-10-CM | POA: Diagnosis not present

## 2024-06-12 LAB — MISC LABCORP TEST (SEND OUT): Labcorp test code: 83935

## 2024-06-12 LAB — GLUCOSE, CAPILLARY
Glucose-Capillary: 254 mg/dL — ABNORMAL HIGH (ref 70–99)
Glucose-Capillary: 266 mg/dL — ABNORMAL HIGH (ref 70–99)
Glucose-Capillary: 282 mg/dL — ABNORMAL HIGH (ref 70–99)
Glucose-Capillary: 288 mg/dL — ABNORMAL HIGH (ref 70–99)

## 2024-06-12 MED ORDER — INSULIN GLARGINE-YFGN 100 UNIT/ML ~~LOC~~ SOLN
15.0000 [IU] | Freq: Every day | SUBCUTANEOUS | Status: DC
  Administered 2024-06-12 – 2024-06-13 (×2): 15 [IU] via SUBCUTANEOUS
  Filled 2024-06-12 (×2): qty 0.15

## 2024-06-12 MED ORDER — DOXYCYCLINE HYCLATE 100 MG PO TABS
100.0000 mg | ORAL_TABLET | Freq: Two times a day (BID) | ORAL | Status: DC
  Administered 2024-06-12 – 2024-06-13 (×2): 100 mg via ORAL
  Filled 2024-06-12 (×2): qty 1

## 2024-06-12 MED ORDER — METHOCARBAMOL 500 MG PO TABS
500.0000 mg | ORAL_TABLET | Freq: Three times a day (TID) | ORAL | Status: DC | PRN
  Administered 2024-06-12 – 2024-06-13 (×3): 500 mg via ORAL
  Filled 2024-06-12 (×3): qty 1

## 2024-06-12 NOTE — Plan of Care (Signed)

## 2024-06-12 NOTE — Plan of Care (Signed)

## 2024-06-12 NOTE — Progress Notes (Addendum)
 Patient appeared to be asleep at the start of the shift, resting comfortably, with a respiratory rate of 16.   Patient then called out c/o 10/10 pain & requesting the pain medication & muscle relaxer be given together. This Investment Banker, Operational educated pt on the use of PRN medication and how taking these two together can cause harm. This writer told the patient they did not feel comfortable giving both PRNs together, unless there is a specific order from the provider to give together.  Informing the pt that if she was still hurting in an hour we can re assess and give a muscle relaxer as needed. Patient got extremely agitated, yelling, & told this Investment Banker, Operational to Get out.   Patient called using call bell multiple times, while this RN was in other rooms providing care. Patient requested to speak to the charge RN and wanted this dealt with.  This Investment Banker, Operational gathered her night time medications, the [employer], & CNA tech in the room to assist with care and attempt to deescalate.   Patient stated: This is between me and the doctor, I don't care if you don't feel comfortable Give me my medicine You are refusing me my medicine   RN House Supervisor and this Investment Banker, Operational endorsed her safety with PRN medications and apologized for not being timely with her scheduled medications. Patient continued to yell.   Night time meds were then given & patient then allowed care.

## 2024-06-12 NOTE — Inpatient Diabetes Management (Signed)
 Inpatient Diabetes Program Recommendations  AACE/ADA: New Consensus Statement on Inpatient Glycemic Control   Target Ranges:  Prepandial:   less than 140 mg/dL      Peak postprandial:   less than 180 mg/dL (1-2 hours)      Critically ill patients:  140 - 180 mg/dL    Latest Reference Range & Units 06/11/24 07:30 06/11/24 11:26 06/11/24 16:25 06/11/24 21:12 06/12/24 07:31  Glucose-Capillary 70 - 99 mg/dL 691 (H) 720 (H) 774 (H) 305 (H) 254 (H)   Review of Glycemic Control  Diabetes history: DM2 Outpatient Diabetes medications: Basaglar  10 units daily, Fiasp  0-10 units QID, Metformin  500 mg QAM Current orders for Inpatient glycemic control: Semglee  10 units at bedtime, Novolog  0-15 units TID with meals, Novolog  0-5 units QHS   Inpatient Diabetes Program Recommendations:     Insulin : Please consider increasing Semglee  to 15 units at bedtime and adding Novolog  3 units TID with meals for meal coverage if patient eats at least 50% of meals.  Thanks, Earnie Gainer, RN, MSN, CDCES Diabetes Coordinator Inpatient Diabetes Program [phone] (Team Pager from 8am to 5pm)

## 2024-06-12 NOTE — Progress Notes (Signed)
 " PROGRESS NOTE    [name]  FMW:[id number] DOB: 12/04/1955 DOA: 06/10/2024 PCP: Patient, [name] Per    Chief Complaint  Patient presents with   Tachycardia    Brief Narrative: As per H&P written by Dr. Manfred on 06/10/2024 [name] is a 69 y.o. female with medical history significant of hypertension, T2DM, DVT on Eliquis , stage II lung cancer s/p right upper lobe lobectomy (2011), bipolar disorder, wheelchair dependent who presents to the emergency department via EMS for from SNF due to 2-day onset of flulike symptoms including cough with production of thick creamy phlegm, body aches, shortness of breath and headache.  She states that she was exposed to her roommate to had Flu.  Nursing facility placed her on 2 LPM of oxygen.  She also complained of about 2-week onset of burning sensation on urination and increased frequency.   ED course In the emergency department, she was tachycardic, tachypneic and febrile with a temperature at triage being 101.58F.  O2 sat was 88% on room air, she was placed on supplemental oxygen at 4 L via [place] with an O2 sat of 97%.  Workup in the ED showed normal CBC and BMP except for sodium of 132, chloride 95, glucose 352, ALP 198, lactic acid 2.4 > 2.1.  Respiratory panel was negative Chest x-ray showed no acute findings. She was empirically treated with IV ceftriaxone  and azithromycin  for presumed pneumonia.  Breathing treatment was provided, Tylenol  was given due to fever.  TRH was asked to admit patient  Assessment & Plan: 1-sepsis due to presumed pneumonia and UTI - Continue maintaining adequate hydration - Continue current IV antibiotics  - Continue supportive care and follow clinical response - Maintain adequate hydration.  2-acute respiratory failure with hypoxia - Appears to be associated with pneumonia as mentioned above - Continue to wean off oxygen supplementation as tolerated - Continue current antibiotics.  3-lactic acidosis - 2.4 at  time of admission; repeat levels after fluid initiated demonstrated 2.1 cm. - Continue to maintain adequate hydration.  4-UTI - Patient with prior history of ESBL - Continue treatment with meropenem  - Maintain adequate hydration - Follow final speciation and culture results.  5-type 2 diabetes mellitus with hyperglycemia - Most recent A1c 8.9 - Continue the use of long-acting insulin  and sliding scale - Long-acting dosage has been adjusted based on CBG fluctuation.  6-pseudohyponatremia - In the setting of hyperglycemia - Maintain adequate hydration - Continue to follow electrolytes trend intermittently.  7-class II obesity -Body mass index is 35.29 kg/m.  -Low-calorie diet and portion control discussed with patient.  8-history of DVT - Continue treatment with Eliquis .  9-foot drop/chronic pain - Continue home analgesic therapy and the use of Neurontin  - Follow clinical response and adjust medication as needed.  10-upper airway congestion Rhinitis - Loratadine  and Flonase  has been started - Follow clinical response.    DVT prophylaxis: Chronically on Eliquis  Code Status: DNR Family Communication: No family at bedside. Disposition:   Status is: Inpatient Remains inpatient appropriate because: Continue with therapy   Consultants:  None  Procedures:  See below for x-ray reports.  Antimicrobials:  Meropenem  and doxycycline   Subjective: No fever, no nausea, no vomiting.  Patient reports no chest pain.  Overall feeling better and breathing easier.  Complaining of significant generalized ongoing pain.  Objective: Vitals:   06/12/24 0352 06/12/24 0402 06/12/24 0757 06/12/24 1403  BP: (!) 85/68 91/63 106/78 101/63  Pulse: (!) 107 97 (!) 102 89  Resp:  18  18 18   Temp: 98.1 F (36.7 C)   97.6 F (36.4 C)  TempSrc: Axillary   Oral  SpO2:  93% 100% 91%  Weight:      Height:        Intake/Output Summary (Last 24 hours) at 06/12/2024 1710 Last data filed at  06/12/2024 0618 Gross per 24 hour  Intake 1080 ml  Output 1000 ml  Net 80 ml   Filed Weights   06/10/24 1831 06/10/24 2157  Weight: 97.5 kg 108.4 kg    Examination: General exam: Alert, awake, able to follow simple commands.  Reporting generalized pain Respiratory system: 2 L nasal cannula in place with good saturations; positive rhonchi appreciated on exam.  No using accessory muscle.  Cardiovascular system: Rate controlled, no rubs, no gallops, no JVD. Gastrointestinal system: Abdomen is obese, nondistended, soft and nontender. No organomegaly or masses felt. Normal bowel sounds heard. Central nervous system:No focal neurological deficits. Extremities: No cyanosis or clubbing; chronic atrophic changes on her feet appreciated bilaterally. Skin: No petechiae. Psychiatry: Judgement and insight appear normal.  Flat affect appreciated on exam.    Data Reviewed: I have personally reviewed following labs and imaging studies  CBC: Recent Labs  Lab 06/10/24 1828 06/11/24 0234  WBC 6.9 3.9*  NEUTROABS 6.1  --   HGB 13.7 12.4  HCT 43.0 38.9  MCV 90.1 89.6  PLT 164 143*    Basic Metabolic Panel: Recent Labs  Lab 06/10/24 1828 06/11/24 0234  NA 132* 138  K 4.0 4.4  CL 95* 102  CO2 22 27  GLUCOSE 352* 340*  BUN 5* 7*  CREATININE 0.61 0.54  CALCIUM  9.0 8.6*  MG  --  1.9  PHOS  --  3.2    GFR: Estimated Creatinine Clearance: 89.5 mL/min (by C-G formula based on SCr of 0.54 mg/dL).  Liver Function Tests: Recent Labs  Lab 06/10/24 1828 06/11/24 0234  AST 17 13*  ALT 30 24  ALKPHOS 198* 156*  BILITOT 0.4 0.3  PROT 7.4 6.2*  ALBUMIN 3.9 3.4*    CBG: Recent Labs  Lab 06/11/24 1625 06/11/24 2112 06/12/24 0731 06/12/24 1132 06/12/24 1621  GLUCAP 225* 305* 254* 266* 282*     Recent Results (from the past 240 hours)  Resp panel by RT-PCR (RSV, Flu A&B, Covid) Anterior Nasal Swab     Status: None   Collection Time: 06/10/24  6:28 PM   Specimen: Anterior  Nasal Swab  Result Value Ref Range Status   SARS Coronavirus 2 by RT PCR NEGATIVE NEGATIVE Final    Comment: (NOTE) SARS-CoV-2 target nucleic acids are NOT DETECTED.  The SARS-CoV-2 RNA is generally detectable in upper respiratory specimens during the acute phase of infection. The lowest concentration of SARS-CoV-2 viral copies this assay can detect is 138 copies/mL. A negative result does not preclude SARS-Cov-2 infection and should not be used as the sole basis for treatment or other patient management decisions. A negative result may occur with  improper specimen collection/handling, submission of specimen other than nasopharyngeal swab, presence of viral mutation(s) within the areas targeted by this assay, and inadequate number of viral copies(<138 copies/mL). A negative result must be combined with clinical observations, patient history, and epidemiological information. The expected result is Negative.  Fact Sheet for Patients:  bloggercourse.com  Fact Sheet for Healthcare Providers:  seriousbroker.it  This test is no t yet approved or cleared by the [place]  FDA and  has been authorized for detection and/or diagnosis of  SARS-CoV-2 by FDA under an Emergency Use Authorization (EUA). This EUA will remain  in effect (meaning this test can be used) for the duration of the COVID-19 declaration under Section 564(b)(1) of the Act, 21 U.S.C.section 360bbb-3(b)(1), unless the authorization is terminated  or revoked sooner.       Influenza A by PCR NEGATIVE NEGATIVE Final   Influenza B by PCR NEGATIVE NEGATIVE Final    Comment: (NOTE) The Xpert Xpress SARS-CoV-2/FLU/RSV plus assay is intended as an aid in the diagnosis of influenza from Nasopharyngeal swab specimens and should not be used as a sole basis for treatment. Nasal washings and aspirates are unacceptable for Xpert Xpress SARS-CoV-2/FLU/RSV testing.  Fact Sheet for  Patients: bloggercourse.com  Fact Sheet for Healthcare Providers: seriousbroker.it  This test is not yet approved or cleared by the [place]  FDA and has been authorized for detection and/or diagnosis of SARS-CoV-2 by FDA under an Emergency Use Authorization (EUA). This EUA will remain in effect (meaning this test can be used) for the duration of the COVID-19 declaration under Section 564(b)(1) of the Act, 21 U.S.C. section 360bbb-3(b)(1), unless the authorization is terminated or revoked.     Resp Syncytial Virus by PCR NEGATIVE NEGATIVE Final    Comment: (NOTE) Fact Sheet for Patients: bloggercourse.com  Fact Sheet for Healthcare Providers: seriousbroker.it  This test is not yet approved or cleared by the [place]  FDA and has been authorized for detection and/or diagnosis of SARS-CoV-2 by FDA under an Emergency Use Authorization (EUA). This EUA will remain in effect (meaning this test can be used) for the duration of the COVID-19 declaration under Section 564(b)(1) of the Act, 21 U.S.C. section 360bbb-3(b)(1), unless the authorization is terminated or revoked.  Performed at [facility], [address] [place], [place] [address]   Culture, blood (Routine x 2)     Status: None (Preliminary result)   Collection Time: 06/10/24  6:33 PM   Specimen: BLOOD  Result Value Ref Range Status   Specimen Description BLOOD RIGHT ANTECUBITAL  Final   Special Requests   Final    BOTTLES DRAWN AEROBIC AND ANAEROBIC Blood Culture adequate volume   Culture   Final    NO GROWTH 2 DAYS Performed at [facility], [address] [place], [place] [address]    Report Status PENDING  Incomplete  Culture, blood (Routine x 2)     Status: None (Preliminary result)   Collection Time: 06/10/24  6:41 PM   Specimen: BLOOD  Result Value Ref Range Status   Specimen Description BLOOD BLOOD RIGHT  WRIST  Final   Special Requests   Final    BOTTLES DRAWN AEROBIC AND ANAEROBIC Blood Culture adequate volume   Culture   Final    NO GROWTH 2 DAYS Performed at [facility], [address], [place], [place] [address]    Report Status PENDING  Incomplete  Urine Culture     Status: Abnormal (Preliminary result)   Collection Time: 06/11/24 12:57 AM   Specimen: Urine, Random  Result Value Ref Range Status   Specimen Description   Final    URINE, RANDOM Performed at [facility], [address] [place], [place] [address]    Special Requests   Final    NONE Reflexed from [id number] Performed at [facility], [address] [place], [place] [address]    Culture (A)  Final    70,000 COLONIES/mL ESCHERICHIA COLI SUSCEPTIBILITIES TO FOLLOW Performed at [facility], [address] [place],  [place] [address]    Report Status PENDING  Incomplete         Radiology Studies: DG Chest Port 1 View if patient is in a treatment room. Result Date: 06/10/2024 EXAM: 1 VIEW(S) XRAY OF THE CHEST 06/10/2024 06:44:29 PM COMPARISON: None available. CLINICAL HISTORY: Suspected sepsis. FINDINGS: LUNGS AND PLEURA: Low lung volumes. No focal pulmonary opacity. No pleural effusion. No pneumothorax. HEART AND MEDIASTINUM: No acute abnormality of the cardiac and mediastinal silhouettes. BONES AND SOFT TISSUES: No acute osseous abnormality. IMPRESSION: 1. No acute findings. 2. Low lung volumes. Electronically signed by: Elsie Gravely MD 06/10/2024 07:18 PM EST RP Workstation: [id number]        Scheduled Meds:  apixaban   5 mg Oral BID   dextromethorphan -guaiFENesin   1 tablet Oral BID   diclofenac  Sodium  2 g Topical QID   doxycycline   100 mg Oral Q12H   fluticasone   1 spray Each Nare Daily   folic acid   1 mg Oral Daily   gabapentin   100 mg Oral Daily   insulin  aspart  0-15 Units Subcutaneous TID WC   insulin  aspart  0-5 Units Subcutaneous QHS   insulin  glargine-yfgn  15 Units Subcutaneous QHS    lithium  carbonate  450 mg Oral QHS   lithium   600 mg Oral Daily   loratadine   10 mg Oral Daily   lubiprostone   24 mcg Oral BID WC   nystatin    Topical BID   polyethylene glycol  17 g Oral Daily   QUEtiapine   200 mg Oral QHS   tamsulosin   0.4 mg Oral Daily   Continuous Infusions:  meropenem  (MERREM ) IV 1 g (06/12/24 1042)     LOS: 2 days    Time spent: 50 minutes    Eric Nunnery, MD [facility]   To contact the attending provider between 7A-7P or the covering provider during after hours 7P-7A, please log into the web site [email or web address] and access using universal [place] password for that web site. If you do not have the password, please call the hospital operator.  06/12/2024, 5:10 PM    "

## 2024-06-13 DIAGNOSIS — J9601 Acute respiratory failure with hypoxia: Secondary | ICD-10-CM

## 2024-06-13 DIAGNOSIS — E66812 Obesity, class 2: Secondary | ICD-10-CM

## 2024-06-13 LAB — URINE CULTURE: Culture: 70000 — AB

## 2024-06-13 LAB — LEGIONELLA PNEUMOPHILA SEROGP 1 UR AG
L. pneumophila Serogp 1 Ur Ag: NEGATIVE
Source of Sample: 0

## 2024-06-13 LAB — GLUCOSE, CAPILLARY
Glucose-Capillary: 319 mg/dL — ABNORMAL HIGH (ref 70–99)
Glucose-Capillary: 293 mg/dL — ABNORMAL HIGH (ref 70–99)
Glucose-Capillary: 273 mg/dL — ABNORMAL HIGH (ref 70–99)

## 2024-06-13 MED ORDER — OXYCODONE-ACETAMINOPHEN 10-325 MG PO TABS: 1.0000 | ORAL_TABLET | Freq: Four times a day (QID) | ORAL | 0 refills | Status: AC | PRN

## 2024-06-13 MED ORDER — DM-GUAIFENESIN ER 30-600 MG PO TB12: 1.0000 | ORAL_TABLET | Freq: Two times a day (BID) | ORAL | Status: AC

## 2024-06-13 MED ORDER — FOLIC ACID 1 MG PO TABS: 1.0000 mg | ORAL_TABLET | Freq: Every day | ORAL | Status: AC

## 2024-06-13 MED ORDER — DICLOFENAC SODIUM 1 % EX GEL: 2.0000 g | Freq: Four times a day (QID) | CUTANEOUS | Status: AC

## 2024-06-13 MED ORDER — METHOCARBAMOL 500 MG PO TABS: 500.0000 mg | ORAL_TABLET | Freq: Three times a day (TID) | ORAL | 0 refills | Status: AC | PRN

## 2024-06-13 MED ORDER — AMOXICILLIN-POT CLAVULANATE 875-125 MG PO TABS: 1.0000 | ORAL_TABLET | Freq: Two times a day (BID) | ORAL | 0 refills | Status: AC

## 2024-06-13 MED ORDER — POLYETHYLENE GLYCOL 3350 17 G PO PACK: 17.0000 g | PACK | Freq: Every day | ORAL | Status: AC

## 2024-06-13 MED ORDER — HYDROMORPHONE HCL 1 MG/ML IJ SOLN
0.5000 mg | Freq: Once | INTRAMUSCULAR | Status: AC
  Administered 2024-06-13: 0.5 mg via INTRAVENOUS
  Filled 2024-06-13: qty 0.5

## 2024-06-13 MED ORDER — GABAPENTIN 100 MG PO CAPS: 100.0000 mg | ORAL_CAPSULE | Freq: Two times a day (BID) | ORAL | Status: AC

## 2024-06-13 MED ORDER — FOSFOMYCIN TROMETHAMINE 3 G PO PACK
3.0000 g | PACK | Freq: Once | ORAL | Status: AC
  Administered 2024-06-13: 3 g via ORAL
  Filled 2024-06-13: qty 3

## 2024-06-13 MED ORDER — AMOXICILLIN-POT CLAVULANATE 875-125 MG PO TABS
1.0000 | ORAL_TABLET | Freq: Two times a day (BID) | ORAL | Status: DC
  Administered 2024-06-13 (×2): 1 via ORAL
  Filled 2024-06-13 (×2): qty 1

## 2024-06-13 MED ORDER — LORATADINE 10 MG PO TABS: 10.0000 mg | ORAL_TABLET | Freq: Every day | ORAL | Status: AC

## 2024-06-13 NOTE — TOC Transition Note (Signed)
 Transition of Care [facility]) - Discharge Note   Patient Details  Name: [name] MRN: [id number] Date of Birth: 1956-02-09  [facility]) CM/SW Contact:  Sharlyne Stabs, RN Phone Number: 06/13/2024, 2:52 PM   Clinical Narrative:   Patient medically ready to return to [facility]. RN calling report, CM scheduled EMS and called to update her sister with discharge. Clinicals sent in the hub.    Final next level of care: Long Term Acute Care (LTAC) Barriers to Discharge: Barriers Resolved   Patient Goals and CMS Choice Patient states their goals for this hospitalization and ongoing recovery are:: [facility].gov Compare Post Acute Care list provided to:: Patient Choice offered to / list presented to : Patient      Discharge Placement                Patient to be transferred to facility by: EMS Name of family member notified: sister Patient and family notified of of transfer: 06/13/24  Discharge Plan and Services Additional resources added to the After Visit Summary for   In-house Referral: Clinical Social Work Discharge Planning Services: NA Post Acute Care Choice: Resumption of Svcs/PTA Provider, Nursing Home          DME Arranged: N/A DME Agency: NA       Social Drivers of Health (SDOH) Interventions SDOH Screenings   Food Insecurity: No Food Insecurity (06/11/2024)  Housing: Low Risk (06/11/2024)  Transportation Needs: No Transportation Needs (06/11/2024)  Utilities: Not At Risk (06/11/2024)  Social Connections: Socially Isolated (06/11/2024)  Tobacco Use: Medium Risk (06/11/2024)     Readmission Risk Interventions    06/11/2024   11:18 AM 06/29/2023   10:37 AM 05/24/2023    4:41 PM  Readmission Risk Prevention Plan  Transportation Screening Complete Complete Complete  Medication Review Oceanographer) Complete Complete Complete  PCP or Specialist appointment within 3-5 days of discharge Complete    HRI or Home  Care Consult Complete Complete Complete  SW Recovery Care/Counseling Consult Complete Complete Complete  Palliative Care Screening Not Applicable Not Applicable Not Applicable  Skilled Nursing Facility Complete Complete Complete

## 2024-06-13 NOTE — Inpatient Diabetes Management (Signed)
 Inpatient Diabetes Program Recommendations  AACE/ADA: New Consensus Statement on Inpatient Glycemic Control   Target Ranges:  Prepandial:   less than 140 mg/dL      Peak postprandial:   less than 180 mg/dL (1-2 hours)      Critically ill patients:  140 - 180 mg/dL    Latest Reference Range & Units 06/12/24 07:31 06/12/24 11:32 06/12/24 16:21 06/12/24 21:03 06/13/24 07:22  Glucose-Capillary 70 - 99 mg/dL 745 (H) 733 (H) 717 (H) 288 (H) 319 (H)   Review of Glycemic Control  Diabetes history: DM2 Outpatient Diabetes medications: Basaglar  10 units daily, Fiasp  0-10 units QID, Metformin  500 mg QAM Current orders for Inpatient glycemic control: Semglee  15 units at bedtime, Novolog  0-15 units TID with meals, Novolog  0-5 units QHS   Inpatient Diabetes Program Recommendations:     Insulin : Please consider increasing Semglee  to 20 units at bedtime and adding Novolog  4 units TID with meals for meal coverage if patient eats at least 50% of meals.   Thanks, Earnie Gainer, RN, MSN, CDCES Diabetes Coordinator Inpatient Diabetes Program [phone] (Team Pager from 8am to 5pm)

## 2024-06-13 NOTE — Care Management Important Message (Signed)
 Important Message  Patient Details  Name: [name] MRN: [id number] Date of Birth: 08-19-1955   Important Message Given:  Yes - Medicare IM     Letina Luckett L Siddharth Babington 06/13/2024, 3:08 PM

## 2024-06-13 NOTE — Plan of Care (Signed)
 " Problem: Education: Goal: Knowledge of General Education information will improve Description: Including pain rating scale, medication(s)/side effects and non-pharmacologic comfort measures Outcome: Adequate for Discharge   Problem: Health Behavior/Discharge Planning: Goal: Ability to manage health-related needs will improve Outcome: Adequate for Discharge   Problem: Clinical Measurements: Goal: Ability to maintain clinical measurements within normal limits will improve Outcome: Adequate for Discharge Goal: Will remain free from infection Outcome: Adequate for Discharge Goal: Diagnostic test results will improve Outcome: Adequate for Discharge Goal: Respiratory complications will improve Outcome: Adequate for Discharge Goal: Cardiovascular complication will be avoided Outcome: Adequate for Discharge   Problem: Activity: Goal: Risk for activity intolerance will decrease Outcome: Adequate for Discharge   Problem: Nutrition: Goal: Adequate nutrition will be maintained Outcome: Adequate for Discharge   Problem: Coping: Goal: Level of anxiety will decrease Outcome: Adequate for Discharge   Problem: Elimination: Goal: Will not experience complications related to bowel motility Outcome: Adequate for Discharge Goal: Will not experience complications related to urinary retention Outcome: Adequate for Discharge   Problem: Pain Managment: Goal: General experience of comfort will improve and/or be controlled Outcome: Adequate for Discharge   Problem: Safety: Goal: Ability to remain free from injury will improve Outcome: Adequate for Discharge   Problem: Skin Integrity: Goal: Risk for impaired skin integrity will decrease Outcome: Adequate for Discharge   Problem: Activity: Goal: Ability to tolerate increased activity will improve Outcome: Adequate for Discharge   Problem: Clinical Measurements: Goal: Ability to maintain a body temperature in the normal range will  improve Outcome: Adequate for Discharge   Problem: Respiratory: Goal: Ability to maintain adequate ventilation will improve Outcome: Adequate for Discharge Goal: Ability to maintain a clear airway will improve Outcome: Adequate for Discharge   Problem: Education: Goal: Ability to describe self-care measures that may prevent or decrease complications (Diabetes Survival Skills Education) will improve Outcome: Adequate for Discharge Goal: Individualized Educational Video(s) Outcome: Adequate for Discharge   Problem: Coping: Goal: Ability to adjust to condition or change in health will improve Outcome: Adequate for Discharge   Problem: Fluid Volume: Goal: Ability to maintain a balanced intake and output will improve Outcome: Adequate for Discharge   Problem: Health Behavior/Discharge Planning: Goal: Ability to identify and utilize available resources and services will improve Outcome: Adequate for Discharge Goal: Ability to manage health-related needs will improve Outcome: Adequate for Discharge   Problem: Metabolic: Goal: Ability to maintain appropriate glucose levels will improve Outcome: Adequate for Discharge   Problem: Nutritional: Goal: Maintenance of adequate nutrition will improve Outcome: Adequate for Discharge Goal: Progress toward achieving an optimal weight will improve Outcome: Adequate for Discharge   Problem: Skin Integrity: Goal: Risk for impaired skin integrity will decrease Outcome: Adequate for Discharge   Problem: Tissue Perfusion: Goal: Adequacy of tissue perfusion will improve Outcome: Adequate for Discharge   Problem: Education: Goal: Knowledge of General Education information will improve Description: Including pain rating scale, medication(s)/side effects and non-pharmacologic comfort measures Outcome: Adequate for Discharge   Problem: Health Behavior/Discharge Planning: Goal: Ability to manage health-related needs will improve Outcome:  Adequate for Discharge   Problem: Clinical Measurements: Goal: Ability to maintain clinical measurements within normal limits will improve Outcome: Adequate for Discharge Goal: Will remain free from infection Outcome: Adequate for Discharge Goal: Diagnostic test results will improve Outcome: Adequate for Discharge Goal: Respiratory complications will improve Outcome: Adequate for Discharge Goal: Cardiovascular complication will be avoided Outcome: Adequate for Discharge   Problem: Activity: Goal: Risk for activity intolerance will decrease Outcome:  Adequate for Discharge   Problem: Nutrition: Goal: Adequate nutrition will be maintained Outcome: Adequate for Discharge   Problem: Coping: Goal: Level of anxiety will decrease Outcome: Adequate for Discharge   Problem: Elimination: Goal: Will not experience complications related to bowel motility Outcome: Adequate for Discharge Goal: Will not experience complications related to urinary retention Outcome: Adequate for Discharge   Problem: Pain Managment: Goal: General experience of comfort will improve and/or be controlled Outcome: Adequate for Discharge   Problem: Safety: Goal: Ability to remain free from injury will improve Outcome: Adequate for Discharge   Problem: Skin Integrity: Goal: Risk for impaired skin integrity will decrease Outcome: Adequate for Discharge   Problem: Activity: Goal: Ability to tolerate increased activity will improve Outcome: Adequate for Discharge   Problem: Clinical Measurements: Goal: Ability to maintain a body temperature in the normal range will improve Outcome: Adequate for Discharge   Problem: Respiratory: Goal: Ability to maintain adequate ventilation will improve Outcome: Adequate for Discharge Goal: Ability to maintain a clear airway will improve Outcome: Adequate for Discharge   Problem: Education: Goal: Ability to describe self-care measures that may prevent or decrease  complications (Diabetes Survival Skills Education) will improve Outcome: Adequate for Discharge Goal: Individualized Educational Video(s) Outcome: Adequate for Discharge   Problem: Coping: Goal: Ability to adjust to condition or change in health will improve Outcome: Adequate for Discharge   Problem: Fluid Volume: Goal: Ability to maintain a balanced intake and output will improve Outcome: Adequate for Discharge   Problem: Health Behavior/Discharge Planning: Goal: Ability to identify and utilize available resources and services will improve Outcome: Adequate for Discharge Goal: Ability to manage health-related needs will improve Outcome: Adequate for Discharge   Problem: Metabolic: Goal: Ability to maintain appropriate glucose levels will improve Outcome: Adequate for Discharge   Problem: Nutritional: Goal: Maintenance of adequate nutrition will improve Outcome: Adequate for Discharge Goal: Progress toward achieving an optimal weight will improve Outcome: Adequate for Discharge   Problem: Skin Integrity: Goal: Risk for impaired skin integrity will decrease Outcome: Adequate for Discharge   Problem: Tissue Perfusion: Goal: Adequacy of tissue perfusion will improve Outcome: Adequate for Discharge   "

## 2024-06-13 NOTE — Plan of Care (Signed)
  Problem: Clinical Measurements: Goal: Ability to maintain clinical measurements within normal limits will improve Outcome: Progressing Goal: Will remain free from infection Outcome: Progressing Goal: Diagnostic test results will improve Outcome: Progressing Goal: Respiratory complications will improve Outcome: Progressing Goal: Cardiovascular complication will be avoided Outcome: Progressing   Problem: Health Behavior/Discharge Planning: Goal: Ability to manage health-related needs will improve Outcome: Not Progressing   Problem: Activity: Goal: Risk for activity intolerance will decrease Outcome: Not Progressing   Problem: Coping: Goal: Level of anxiety will decrease Outcome: Not Progressing

## 2024-06-15 LAB — CULTURE, BLOOD (ROUTINE X 2)
Culture: NO GROWTH
Culture: NO GROWTH
Special Requests: ADEQUATE
Special Requests: ADEQUATE
